# Patient Record
Sex: Male | Born: 1935 | Race: White | Hispanic: No | Marital: Married | Smoking: Former smoker
Health system: Southern US, Community
[De-identification: ages and names within clinical notes are randomized; demographics above are authoritative.]

## PROBLEM LIST (undated history)

## (undated) DIAGNOSIS — J90 Pleural effusion, not elsewhere classified: Secondary | ICD-10-CM

## (undated) DIAGNOSIS — I209 Angina pectoris, unspecified: Secondary | ICD-10-CM

## (undated) DIAGNOSIS — N183 Chronic kidney disease, stage 3 unspecified: Secondary | ICD-10-CM

## (undated) DIAGNOSIS — I6529 Occlusion and stenosis of unspecified carotid artery: Secondary | ICD-10-CM

## (undated) DIAGNOSIS — I5030 Unspecified diastolic (congestive) heart failure: Secondary | ICD-10-CM

## (undated) DIAGNOSIS — R42 Dizziness and giddiness: Secondary | ICD-10-CM

## (undated) DIAGNOSIS — I2510 Atherosclerotic heart disease of native coronary artery without angina pectoris: Secondary | ICD-10-CM

## (undated) DIAGNOSIS — K219 Gastro-esophageal reflux disease without esophagitis: Secondary | ICD-10-CM

## (undated) DIAGNOSIS — C8350 Lymphoblastic (diffuse) lymphoma, unspecified site: Secondary | ICD-10-CM

## (undated) DIAGNOSIS — N400 Enlarged prostate without lower urinary tract symptoms: Secondary | ICD-10-CM

## (undated) DIAGNOSIS — E785 Hyperlipidemia, unspecified: Secondary | ICD-10-CM

## (undated) DIAGNOSIS — L570 Actinic keratosis: Secondary | ICD-10-CM

## (undated) DIAGNOSIS — Z8669 Personal history of other diseases of the nervous system and sense organs: Secondary | ICD-10-CM

## (undated) DIAGNOSIS — F419 Anxiety disorder, unspecified: Secondary | ICD-10-CM

## (undated) DIAGNOSIS — I10 Essential (primary) hypertension: Secondary | ICD-10-CM

## (undated) DIAGNOSIS — F329 Major depressive disorder, single episode, unspecified: Secondary | ICD-10-CM

## (undated) DIAGNOSIS — G8929 Other chronic pain: Secondary | ICD-10-CM

## (undated) DIAGNOSIS — K4090 Unilateral inguinal hernia, without obstruction or gangrene, not specified as recurrent: Secondary | ICD-10-CM

## (undated) DIAGNOSIS — G4730 Sleep apnea, unspecified: Secondary | ICD-10-CM

## (undated) DIAGNOSIS — M549 Dorsalgia, unspecified: Secondary | ICD-10-CM

## (undated) HISTORY — PX: COLONOSCOPY: SHX174

## (undated) HISTORY — PX: FEMUR FRACTURE SURGERY: SHX633

## (undated) HISTORY — DX: Personal history of other diseases of the nervous system and sense organs: Z86.69

## (undated) HISTORY — DX: Major depressive disorder, single episode, unspecified: F32.9

## (undated) HISTORY — DX: Dizziness and giddiness: R42

## (undated) HISTORY — PX: HIP FRACTURE SURGERY: SHX118

## (undated) HISTORY — DX: Benign prostatic hyperplasia without lower urinary tract symptoms: N40.0

## (undated) HISTORY — DX: Unspecified diastolic (congestive) heart failure: I50.30

## (undated) HISTORY — DX: Essential (primary) hypertension: I10

## (undated) HISTORY — DX: Unilateral inguinal hernia, without obstruction or gangrene, not specified as recurrent: K40.90

## (undated) HISTORY — DX: Dorsalgia, unspecified: M54.9

## (undated) HISTORY — DX: Atherosclerotic heart disease of native coronary artery without angina pectoris: I25.10

## (undated) HISTORY — DX: Other chronic pain: G89.29

## (undated) HISTORY — DX: Gastro-esophageal reflux disease without esophagitis: K21.9

## (undated) HISTORY — DX: Sleep apnea, unspecified: G47.30

## (undated) HISTORY — DX: Hyperlipidemia, unspecified: E78.5

## (undated) HISTORY — PX: APPENDECTOMY: SHX54

## (undated) HISTORY — DX: Actinic keratosis: L57.0

## (undated) HISTORY — DX: Pleural effusion, not elsewhere classified: J90

## (undated) HISTORY — DX: Occlusion and stenosis of unspecified carotid artery: I65.29

## (undated) HISTORY — DX: Anxiety disorder, unspecified: F41.9

## (undated) HISTORY — DX: Lymphoblastic (diffuse) lymphoma, unspecified site: C83.50

## (undated) HISTORY — PX: HEMORROIDECTOMY: SUR656

---

## 1996-02-17 DIAGNOSIS — C8350 Lymphoblastic (diffuse) lymphoma, unspecified site: Secondary | ICD-10-CM

## 1996-02-17 HISTORY — DX: Lymphoblastic (diffuse) lymphoma, unspecified site: C83.50

## 2000-02-17 HISTORY — PX: CARDIAC CATHETERIZATION: SHX172

## 2006-02-16 HISTORY — PX: OTHER SURGICAL HISTORY: SHX169

## 2006-02-16 HISTORY — PX: EYE SURGERY: SHX253

## 2011-12-20 DIAGNOSIS — Z8669 Personal history of other diseases of the nervous system and sense organs: Secondary | ICD-10-CM | POA: Insufficient documentation

## 2013-06-03 ENCOUNTER — Emergency Department: Payer: Self-pay | Admitting: Emergency Medicine

## 2013-06-03 LAB — COMPREHENSIVE METABOLIC PANEL
Albumin: 3.6 g/dL (ref 3.4–5.0)
Alkaline Phosphatase: 94 U/L
Anion Gap: 5 — ABNORMAL LOW (ref 7–16)
BUN: 15 mg/dL (ref 7–18)
Bilirubin,Total: 0.5 mg/dL (ref 0.2–1.0)
Calcium, Total: 9.1 mg/dL (ref 8.5–10.1)
Chloride: 99 mmol/L (ref 98–107)
Co2: 26 mmol/L (ref 21–32)
Creatinine: 1.49 mg/dL — ABNORMAL HIGH (ref 0.60–1.30)
EGFR (African American): 48 — ABNORMAL LOW
EGFR (Non-African Amer.): 42 — ABNORMAL LOW
Glucose: 131 mg/dL — ABNORMAL HIGH (ref 65–99)
Osmolality: 263 (ref 275–301)
Potassium: 4.5 mmol/L (ref 3.5–5.1)
SGOT(AST): 28 U/L (ref 15–37)
SGPT (ALT): 21 U/L (ref 12–78)
Sodium: 130 mmol/L — ABNORMAL LOW (ref 136–145)
Total Protein: 9.3 g/dL — ABNORMAL HIGH (ref 6.4–8.2)

## 2013-06-03 LAB — CBC
HCT: 40.2 % (ref 40.0–52.0)
HGB: 13.1 g/dL (ref 13.0–18.0)
MCH: 28.6 pg (ref 26.0–34.0)
MCHC: 32.5 g/dL (ref 32.0–36.0)
MCV: 88 fL (ref 80–100)
Platelet: 242 10*3/uL (ref 150–440)
RBC: 4.57 10*6/uL (ref 4.40–5.90)
RDW: 14.2 % (ref 11.5–14.5)
WBC: 13.5 10*3/uL — ABNORMAL HIGH (ref 3.8–10.6)

## 2013-06-03 LAB — URINALYSIS, COMPLETE
Bacteria: NONE SEEN
Bilirubin,UR: NEGATIVE
Glucose,UR: NEGATIVE mg/dL (ref 0–75)
Ketone: NEGATIVE
Leukocyte Esterase: NEGATIVE
Nitrite: NEGATIVE
Ph: 6 (ref 4.5–8.0)
Protein: NEGATIVE
RBC,UR: 20 /HPF (ref 0–5)
Specific Gravity: 1.014 (ref 1.003–1.030)
Squamous Epithelial: NONE SEEN
WBC UR: <1 /HPF (ref 0–5)

## 2013-06-03 LAB — TROPONIN I: Troponin-I: <0.02 ng/mL

## 2013-06-16 HISTORY — PX: CARDIAC CATHETERIZATION: SHX172

## 2013-08-02 ENCOUNTER — Inpatient Hospital Stay: Payer: Self-pay | Admitting: Specialist

## 2013-08-02 LAB — PROTIME-INR
INR: 1
Prothrombin Time: 13.4 secs (ref 11.5–14.7)

## 2013-08-02 LAB — CBC WITH DIFFERENTIAL/PLATELET
Basophil #: 0.1 10*3/uL (ref 0.0–0.1)
Basophil %: 0.5 %
Eosinophil #: 0.1 10*3/uL (ref 0.0–0.7)
Eosinophil %: 1 %
HCT: 35.4 % — ABNORMAL LOW (ref 40.0–52.0)
HGB: 11.8 g/dL — ABNORMAL LOW (ref 13.0–18.0)
Lymphocyte #: 1.4 10*3/uL (ref 1.0–3.6)
Lymphocyte %: 12.3 %
MCH: 29.4 pg (ref 26.0–34.0)
MCHC: 33.3 g/dL (ref 32.0–36.0)
MCV: 88 fL (ref 80–100)
Monocyte #: 0.6 x10 3/mm (ref 0.2–1.0)
Monocyte %: 5 %
Neutrophil #: 9.1 10*3/uL — ABNORMAL HIGH (ref 1.4–6.5)
Neutrophil %: 81.2 %
Platelet: 201 10*3/uL (ref 150–440)
RBC: 4.01 10*6/uL — ABNORMAL LOW (ref 4.40–5.90)
RDW: 13.5 % (ref 11.5–14.5)
WBC: 11.2 10*3/uL — ABNORMAL HIGH (ref 3.8–10.6)

## 2013-08-02 LAB — URINALYSIS, COMPLETE
Bilirubin,UR: NEGATIVE
Blood: NEGATIVE
Glucose,UR: NEGATIVE mg/dL (ref 0–75)
Ketone: NEGATIVE
Leukocyte Esterase: NEGATIVE
Nitrite: NEGATIVE
Ph: 6 (ref 4.5–8.0)
Protein: NEGATIVE
RBC,UR: NONE SEEN /HPF (ref 0–5)
Specific Gravity: 1.012 (ref 1.003–1.030)
Squamous Epithelial: NONE SEEN
WBC UR: <1 /HPF (ref 0–5)

## 2013-08-02 LAB — MAGNESIUM: Magnesium: 2 mg/dL

## 2013-08-02 LAB — CK-MB: CK-MB: 1.6 ng/mL (ref 0.5–3.6)

## 2013-08-02 LAB — TROPONIN I
Troponin-I: <0.02 ng/mL
Troponin-I: <0.02 ng/mL
Troponin-I: <0.02 ng/mL

## 2013-08-02 LAB — CK TOTAL AND CKMB (NOT AT ARMC)
CK, Total: 89 U/L
CK, Total: 78 U/L
CK-MB: 2.2 ng/mL (ref 0.5–3.6)
CK-MB: 1.9 ng/mL (ref 0.5–3.6)

## 2013-08-02 LAB — BASIC METABOLIC PANEL
Anion Gap: 11 (ref 7–16)
BUN: 17 mg/dL (ref 7–18)
Calcium, Total: 8.6 mg/dL (ref 8.5–10.1)
Chloride: 101 mmol/L (ref 98–107)
Co2: 25 mmol/L (ref 21–32)
Creatinine: 1.46 mg/dL — ABNORMAL HIGH (ref 0.60–1.30)
EGFR (African American): 49 — ABNORMAL LOW
EGFR (Non-African Amer.): 43 — ABNORMAL LOW
Glucose: 154 mg/dL — ABNORMAL HIGH (ref 65–99)
Osmolality: 278 (ref 275–301)
Potassium: 3.9 mmol/L (ref 3.5–5.1)
Sodium: 137 mmol/L (ref 136–145)

## 2013-08-02 LAB — APTT: Activated PTT: 33.3 secs (ref 23.6–35.9)

## 2013-08-03 LAB — CBC WITH DIFFERENTIAL/PLATELET
Basophil #: 0 10*3/uL (ref 0.0–0.1)
Basophil %: 0.2 %
Eosinophil #: 0.3 10*3/uL (ref 0.0–0.7)
Eosinophil %: 2 %
HCT: 33.4 % — ABNORMAL LOW (ref 40.0–52.0)
HGB: 11 g/dL — ABNORMAL LOW (ref 13.0–18.0)
Lymphocyte #: 1.2 10*3/uL (ref 1.0–3.6)
Lymphocyte %: 8.7 %
MCH: 29 pg (ref 26.0–34.0)
MCHC: 32.8 g/dL (ref 32.0–36.0)
MCV: 88 fL (ref 80–100)
Monocyte #: 0.9 x10 3/mm (ref 0.2–1.0)
Monocyte %: 6.8 %
Neutrophil #: 11 10*3/uL — ABNORMAL HIGH (ref 1.4–6.5)
Neutrophil %: 82.3 %
Platelet: 188 10*3/uL (ref 150–440)
RBC: 3.78 10*6/uL — ABNORMAL LOW (ref 4.40–5.90)
RDW: 12.9 % (ref 11.5–14.5)
WBC: 13.4 10*3/uL — ABNORMAL HIGH (ref 3.8–10.6)

## 2013-08-03 LAB — BASIC METABOLIC PANEL
Anion Gap: 6 — ABNORMAL LOW (ref 7–16)
BUN: 13 mg/dL (ref 7–18)
Calcium, Total: 8.6 mg/dL (ref 8.5–10.1)
Chloride: 102 mmol/L (ref 98–107)
Co2: 26 mmol/L (ref 21–32)
Creatinine: 1.24 mg/dL (ref 0.60–1.30)
EGFR (African American): >60
EGFR (Non-African Amer.): 52 — ABNORMAL LOW
Glucose: 121 mg/dL — ABNORMAL HIGH (ref 65–99)
Osmolality: 270 (ref 275–301)
Potassium: 4.5 mmol/L (ref 3.5–5.1)
Sodium: 134 mmol/L — ABNORMAL LOW (ref 136–145)

## 2013-08-03 LAB — MAGNESIUM: Magnesium: 2 mg/dL

## 2013-08-04 LAB — BASIC METABOLIC PANEL
Anion Gap: 5 — ABNORMAL LOW (ref 7–16)
BUN: 15 mg/dL (ref 7–18)
Calcium, Total: 8.8 mg/dL (ref 8.5–10.1)
Chloride: 102 mmol/L (ref 98–107)
Co2: 27 mmol/L (ref 21–32)
Creatinine: 1.3 mg/dL (ref 0.60–1.30)
EGFR (African American): 57 — ABNORMAL LOW
EGFR (Non-African Amer.): 49 — ABNORMAL LOW
Glucose: 121 mg/dL — ABNORMAL HIGH (ref 65–99)
Osmolality: 270 (ref 275–301)
Potassium: 4.5 mmol/L (ref 3.5–5.1)
Sodium: 134 mmol/L — ABNORMAL LOW (ref 136–145)

## 2013-08-04 LAB — CBC WITH DIFFERENTIAL/PLATELET
Basophil #: 0 10*3/uL (ref 0.0–0.1)
Basophil %: 0.1 %
Eosinophil #: 0 10*3/uL (ref 0.0–0.7)
Eosinophil %: 0 %
HCT: 31.9 % — ABNORMAL LOW (ref 40.0–52.0)
HGB: 10.5 g/dL — ABNORMAL LOW (ref 13.0–18.0)
Lymphocyte #: 1 10*3/uL (ref 1.0–3.6)
Lymphocyte %: 4.7 %
MCH: 28.9 pg (ref 26.0–34.0)
MCHC: 32.9 g/dL (ref 32.0–36.0)
MCV: 88 fL (ref 80–100)
Monocyte #: 1 x10 3/mm (ref 0.2–1.0)
Monocyte %: 5 %
Neutrophil #: 18.5 10*3/uL — ABNORMAL HIGH (ref 1.4–6.5)
Neutrophil %: 90.2 %
Platelet: 181 10*3/uL (ref 150–440)
RBC: 3.63 10*6/uL — ABNORMAL LOW (ref 4.40–5.90)
RDW: 12.9 % (ref 11.5–14.5)
WBC: 20.6 10*3/uL — ABNORMAL HIGH (ref 3.8–10.6)

## 2013-08-05 LAB — WBC: WBC: 11.4 10*3/uL — ABNORMAL HIGH (ref 3.8–10.6)

## 2013-08-05 LAB — HEMOGLOBIN: HGB: 9.8 g/dL — ABNORMAL LOW (ref 13.0–18.0)

## 2013-12-13 ENCOUNTER — Inpatient Hospital Stay: Payer: Self-pay | Admitting: Internal Medicine

## 2013-12-13 LAB — BASIC METABOLIC PANEL
Anion Gap: 7 (ref 7–16)
BUN: 18 mg/dL (ref 7–18)
Calcium, Total: 8.7 mg/dL (ref 8.5–10.1)
Chloride: 102 mmol/L (ref 98–107)
Co2: 27 mmol/L (ref 21–32)
Creatinine: 1.36 mg/dL — ABNORMAL HIGH (ref 0.60–1.30)
EGFR (African American): >60
EGFR (Non-African Amer.): 53 — ABNORMAL LOW
Glucose: 123 mg/dL — ABNORMAL HIGH (ref 65–99)
Osmolality: 275 (ref 275–301)
Potassium: 3.9 mmol/L (ref 3.5–5.1)
Sodium: 136 mmol/L (ref 136–145)

## 2013-12-13 LAB — CBC
HCT: 31.7 % — ABNORMAL LOW (ref 40.0–52.0)
HGB: 10.1 g/dL — ABNORMAL LOW (ref 13.0–18.0)
MCH: 28.2 pg (ref 26.0–34.0)
MCHC: 31.9 g/dL — ABNORMAL LOW (ref 32.0–36.0)
MCV: 88 fL (ref 80–100)
Platelet: 212 10*3/uL (ref 150–440)
RBC: 3.59 10*6/uL — ABNORMAL LOW (ref 4.40–5.90)
RDW: 13.7 % (ref 11.5–14.5)
WBC: 8.5 10*3/uL (ref 3.8–10.6)

## 2013-12-13 LAB — TROPONIN I: Troponin-I: <0.02 ng/mL

## 2013-12-14 LAB — BASIC METABOLIC PANEL
Anion Gap: 6 — ABNORMAL LOW (ref 7–16)
BUN: 16 mg/dL (ref 7–18)
Calcium, Total: 8.7 mg/dL (ref 8.5–10.1)
Chloride: 103 mmol/L (ref 98–107)
Co2: 28 mmol/L (ref 21–32)
Creatinine: 1.24 mg/dL (ref 0.60–1.30)
EGFR (African American): >60
EGFR (Non-African Amer.): 58 — ABNORMAL LOW
Glucose: 137 mg/dL — ABNORMAL HIGH (ref 65–99)
Osmolality: 277 (ref 275–301)
Potassium: 4.3 mmol/L (ref 3.5–5.1)
Sodium: 137 mmol/L (ref 136–145)

## 2013-12-14 LAB — CBC WITH DIFFERENTIAL/PLATELET
Basophil #: 0 10*3/uL (ref 0.0–0.1)
Basophil %: 0.2 %
Eosinophil #: 0.1 10*3/uL (ref 0.0–0.7)
Eosinophil %: 0.7 %
HCT: 28.1 % — ABNORMAL LOW (ref 40.0–52.0)
HGB: 9.2 g/dL — ABNORMAL LOW (ref 13.0–18.0)
Lymphocyte #: 1.3 10*3/uL (ref 1.0–3.6)
Lymphocyte %: 7.8 %
MCH: 28.7 pg (ref 26.0–34.0)
MCHC: 32.8 g/dL (ref 32.0–36.0)
MCV: 87 fL (ref 80–100)
Monocyte #: 1.5 x10 3/mm — ABNORMAL HIGH (ref 0.2–1.0)
Monocyte %: 9.1 %
Neutrophil #: 13.3 10*3/uL — ABNORMAL HIGH (ref 1.4–6.5)
Neutrophil %: 82.2 %
Platelet: 215 10*3/uL (ref 150–440)
RBC: 3.21 10*6/uL — ABNORMAL LOW (ref 4.40–5.90)
RDW: 13.3 % (ref 11.5–14.5)
WBC: 16.3 10*3/uL — ABNORMAL HIGH (ref 3.8–10.6)

## 2013-12-14 LAB — HEMOGLOBIN: HGB: 9.6 g/dL — ABNORMAL LOW (ref 13.0–18.0)

## 2013-12-14 LAB — TSH: Thyroid Stimulating Horm: 0.267 u[IU]/mL — ABNORMAL LOW

## 2013-12-14 LAB — LIPID PANEL
Cholesterol: 60 mg/dL (ref 0–200)
HDL Cholesterol: 24 mg/dL — ABNORMAL LOW (ref 40–60)
Ldl Cholesterol, Calc: 20 mg/dL (ref 0–100)
Triglycerides: 80 mg/dL (ref 0–200)
VLDL Cholesterol, Calc: 16 mg/dL (ref 5–40)

## 2013-12-14 LAB — HEMOGLOBIN A1C: Hemoglobin A1C: 5.7 % (ref 4.2–6.3)

## 2013-12-15 LAB — CBC WITH DIFFERENTIAL/PLATELET
Basophil #: 0 10*3/uL (ref 0.0–0.1)
Basophil #: 0 10*3/uL (ref 0.0–0.1)
Basophil #: 0 10*3/uL (ref 0.0–0.1)
Basophil %: 0.1 %
Basophil %: 0 %
Basophil %: 0.1 %
Eosinophil #: 0 10*3/uL (ref 0.0–0.7)
Eosinophil #: 0 10*3/uL (ref 0.0–0.7)
Eosinophil #: 0 10*3/uL (ref 0.0–0.7)
Eosinophil %: 0 %
Eosinophil %: 0 %
Eosinophil %: 0 %
HCT: 28.4 % — ABNORMAL LOW (ref 40.0–52.0)
HCT: 24.5 % — ABNORMAL LOW (ref 40.0–52.0)
HCT: 25.2 % — ABNORMAL LOW (ref 40.0–52.0)
HGB: 9 g/dL — ABNORMAL LOW (ref 13.0–18.0)
HGB: 7.7 g/dL — ABNORMAL LOW (ref 13.0–18.0)
HGB: 8 g/dL — ABNORMAL LOW (ref 13.0–18.0)
Lymphocyte #: 1.4 10*3/uL (ref 1.0–3.6)
Lymphocyte #: 1.3 10*3/uL (ref 1.0–3.6)
Lymphocyte #: 1.6 10*3/uL (ref 1.0–3.6)
Lymphocyte %: 3.1 %
Lymphocyte %: 3.7 %
Lymphocyte %: 5.2 %
MCH: 28.2 pg (ref 26.0–34.0)
MCH: 28.4 pg (ref 26.0–34.0)
MCH: 28.3 pg (ref 26.0–34.0)
MCHC: 31.8 g/dL — ABNORMAL LOW (ref 32.0–36.0)
MCHC: 31.7 g/dL — ABNORMAL LOW (ref 32.0–36.0)
MCHC: 31.9 g/dL — ABNORMAL LOW (ref 32.0–36.0)
MCV: 89 fL (ref 80–100)
MCV: 90 fL (ref 80–100)
MCV: 89 fL (ref 80–100)
Monocyte #: 2.5 x10 3/mm — ABNORMAL HIGH (ref 0.2–1.0)
Monocyte #: 1.3 x10 3/mm — ABNORMAL HIGH (ref 0.2–1.0)
Monocyte #: 2.1 x10 3/mm — ABNORMAL HIGH (ref 0.2–1.0)
Monocyte %: 5.6 %
Monocyte %: 3.8 %
Monocyte %: 6.6 %
Neutrophil #: 41 10*3/uL — ABNORMAL HIGH (ref 1.4–6.5)
Neutrophil #: 32.4 10*3/uL — ABNORMAL HIGH (ref 1.4–6.5)
Neutrophil #: 27.5 10*3/uL — ABNORMAL HIGH (ref 1.4–6.5)
Neutrophil %: 91.2 %
Neutrophil %: 92.5 %
Neutrophil %: 88.1 %
Platelet: 173 10*3/uL (ref 150–440)
Platelet: 154 10*3/uL (ref 150–440)
Platelet: 145 10*3/uL — ABNORMAL LOW (ref 150–440)
RBC: 3.19 10*6/uL — ABNORMAL LOW (ref 4.40–5.90)
RBC: 2.73 10*6/uL — ABNORMAL LOW (ref 4.40–5.90)
RBC: 2.84 10*6/uL — ABNORMAL LOW (ref 4.40–5.90)
RDW: 13.3 % (ref 11.5–14.5)
RDW: 13.4 % (ref 11.5–14.5)
RDW: 13.5 % (ref 11.5–14.5)
WBC: 45 10*3/uL — ABNORMAL HIGH (ref 3.8–10.6)
WBC: 35 10*3/uL — ABNORMAL HIGH (ref 3.8–10.6)
WBC: 31.2 10*3/uL — ABNORMAL HIGH (ref 3.8–10.6)

## 2013-12-15 LAB — URINALYSIS, COMPLETE
Bilirubin,UR: NEGATIVE
Glucose,UR: NEGATIVE mg/dL (ref 0–75)
Granular Cast: 3
Ketone: NEGATIVE
Leukocyte Esterase: NEGATIVE
Nitrite: NEGATIVE
Ph: 5 (ref 4.5–8.0)
Protein: 30
RBC,UR: 148 /HPF (ref 0–5)
Specific Gravity: 1.028 (ref 1.003–1.030)
Squamous Epithelial: 1
WBC UR: 10 /HPF (ref 0–5)

## 2013-12-15 LAB — BASIC METABOLIC PANEL
Anion Gap: 14 (ref 7–16)
Anion Gap: 9 (ref 7–16)
BUN: 25 mg/dL — ABNORMAL HIGH (ref 7–18)
BUN: 33 mg/dL — ABNORMAL HIGH (ref 7–18)
Calcium, Total: 7.8 mg/dL — ABNORMAL LOW (ref 8.5–10.1)
Calcium, Total: 7.6 mg/dL — ABNORMAL LOW (ref 8.5–10.1)
Chloride: 107 mmol/L (ref 98–107)
Chloride: 106 mmol/L (ref 98–107)
Co2: 17 mmol/L — ABNORMAL LOW (ref 21–32)
Co2: 24 mmol/L (ref 21–32)
Creatinine: 2.3 mg/dL — ABNORMAL HIGH (ref 0.60–1.30)
Creatinine: 2.58 mg/dL — ABNORMAL HIGH (ref 0.60–1.30)
EGFR (African American): 35 — ABNORMAL LOW
EGFR (African American): 30 — ABNORMAL LOW
EGFR (Non-African Amer.): 29 — ABNORMAL LOW
EGFR (Non-African Amer.): 25 — ABNORMAL LOW
Glucose: 161 mg/dL — ABNORMAL HIGH (ref 65–99)
Glucose: 148 mg/dL — ABNORMAL HIGH (ref 65–99)
Osmolality: 284 (ref 275–301)
Osmolality: 288 (ref 275–301)
Potassium: 5.5 mmol/L — ABNORMAL HIGH (ref 3.5–5.1)
Potassium: 5 mmol/L (ref 3.5–5.1)
Sodium: 138 mmol/L (ref 136–145)
Sodium: 139 mmol/L (ref 136–145)

## 2013-12-15 LAB — T4, FREE: Free Thyroxine: 0.88 ng/dL (ref 0.76–1.46)

## 2013-12-16 LAB — BASIC METABOLIC PANEL
Anion Gap: 8 (ref 7–16)
Anion Gap: 6 — ABNORMAL LOW (ref 7–16)
BUN: 31 mg/dL — ABNORMAL HIGH (ref 7–18)
BUN: 25 mg/dL — ABNORMAL HIGH (ref 7–18)
Calcium, Total: 7.5 mg/dL — ABNORMAL LOW (ref 8.5–10.1)
Calcium, Total: 7.2 mg/dL — ABNORMAL LOW (ref 8.5–10.1)
Chloride: 102 mmol/L (ref 98–107)
Chloride: 104 mmol/L (ref 98–107)
Co2: 27 mmol/L (ref 21–32)
Co2: 28 mmol/L (ref 21–32)
Creatinine: 1.83 mg/dL — ABNORMAL HIGH (ref 0.60–1.30)
Creatinine: 1.41 mg/dL — ABNORMAL HIGH (ref 0.60–1.30)
EGFR (African American): 45 — ABNORMAL LOW
EGFR (African American): >60
EGFR (Non-African Amer.): 37 — ABNORMAL LOW
EGFR (Non-African Amer.): 50 — ABNORMAL LOW
Glucose: 131 mg/dL — ABNORMAL HIGH (ref 65–99)
Glucose: 114 mg/dL — ABNORMAL HIGH (ref 65–99)
Osmolality: 282 (ref 275–301)
Osmolality: 281 (ref 275–301)
Potassium: 4 mmol/L (ref 3.5–5.1)
Potassium: 4.2 mmol/L (ref 3.5–5.1)
Sodium: 137 mmol/L (ref 136–145)
Sodium: 138 mmol/L (ref 136–145)

## 2013-12-16 LAB — CBC WITH DIFFERENTIAL/PLATELET
Basophil #: 0 10*3/uL (ref 0.0–0.1)
Basophil %: 0 %
Eosinophil #: 0 10*3/uL (ref 0.0–0.7)
Eosinophil %: 0.2 %
HCT: 25.2 % — ABNORMAL LOW (ref 40.0–52.0)
HGB: 8.4 g/dL — ABNORMAL LOW (ref 13.0–18.0)
Lymphocyte #: 0.9 10*3/uL — ABNORMAL LOW (ref 1.0–3.6)
Lymphocyte %: 3.7 %
MCH: 28.6 pg (ref 26.0–34.0)
MCHC: 33.1 g/dL (ref 32.0–36.0)
MCV: 87 fL (ref 80–100)
Monocyte #: 1.6 x10 3/mm — ABNORMAL HIGH (ref 0.2–1.0)
Monocyte %: 6.5 %
Neutrophil #: 22.4 10*3/uL — ABNORMAL HIGH (ref 1.4–6.5)
Neutrophil %: 89.6 %
Platelet: 143 10*3/uL — ABNORMAL LOW (ref 150–440)
RBC: 2.92 10*6/uL — ABNORMAL LOW (ref 4.40–5.90)
RDW: 13.5 % (ref 11.5–14.5)
WBC: 25 10*3/uL — ABNORMAL HIGH (ref 3.8–10.6)

## 2013-12-16 LAB — HEMOGLOBIN: HGB: 7.7 g/dL — ABNORMAL LOW (ref 13.0–18.0)

## 2013-12-17 LAB — BASIC METABOLIC PANEL
Anion Gap: 7 (ref 7–16)
BUN: 23 mg/dL — ABNORMAL HIGH (ref 7–18)
Calcium, Total: 7.6 mg/dL — ABNORMAL LOW (ref 8.5–10.1)
Chloride: 103 mmol/L (ref 98–107)
Co2: 27 mmol/L (ref 21–32)
Creatinine: 1.2 mg/dL (ref 0.60–1.30)
EGFR (African American): >60
EGFR (Non-African Amer.): >60
Glucose: 104 mg/dL — ABNORMAL HIGH (ref 65–99)
Osmolality: 278 (ref 275–301)
Potassium: 4.2 mmol/L (ref 3.5–5.1)
Sodium: 137 mmol/L (ref 136–145)

## 2013-12-17 LAB — CBC WITH DIFFERENTIAL/PLATELET
Basophil #: 0 10*3/uL (ref 0.0–0.1)
Basophil %: 0.1 %
Eosinophil #: 0.1 10*3/uL (ref 0.0–0.7)
Eosinophil %: 0.7 %
HCT: 25.6 % — ABNORMAL LOW (ref 40.0–52.0)
HGB: 8.5 g/dL — ABNORMAL LOW (ref 13.0–18.0)
Lymphocyte #: 0.8 10*3/uL — ABNORMAL LOW (ref 1.0–3.6)
Lymphocyte %: 4 %
MCH: 28.7 pg (ref 26.0–34.0)
MCHC: 33.1 g/dL (ref 32.0–36.0)
MCV: 87 fL (ref 80–100)
Monocyte #: 1.7 x10 3/mm — ABNORMAL HIGH (ref 0.2–1.0)
Monocyte %: 8.6 %
Neutrophil #: 16.7 10*3/uL — ABNORMAL HIGH (ref 1.4–6.5)
Neutrophil %: 86.6 %
Platelet: 156 10*3/uL (ref 150–440)
RBC: 2.95 10*6/uL — ABNORMAL LOW (ref 4.40–5.90)
RDW: 14.8 % — ABNORMAL HIGH (ref 11.5–14.5)
WBC: 19.3 10*3/uL — ABNORMAL HIGH (ref 3.8–10.6)

## 2013-12-18 LAB — BASIC METABOLIC PANEL
Anion Gap: 7 (ref 7–16)
BUN: 14 mg/dL (ref 7–18)
Calcium, Total: 8.6 mg/dL (ref 8.5–10.1)
Chloride: 103 mmol/L (ref 98–107)
Co2: 26 mmol/L (ref 21–32)
Creatinine: 1.19 mg/dL (ref 0.60–1.30)
EGFR (African American): >60
EGFR (Non-African Amer.): >60
Glucose: 95 mg/dL (ref 65–99)
Osmolality: 272 (ref 275–301)
Potassium: 4.5 mmol/L (ref 3.5–5.1)
Sodium: 136 mmol/L (ref 136–145)

## 2013-12-18 LAB — HEMOGLOBIN: HGB: 8.7 g/dL — ABNORMAL LOW (ref 13.0–18.0)

## 2013-12-19 ENCOUNTER — Encounter: Payer: Self-pay | Admitting: Internal Medicine

## 2013-12-20 LAB — CULTURE, BLOOD (SINGLE)

## 2013-12-24 LAB — BETA STREP CULTURE(ARMC)

## 2014-01-16 ENCOUNTER — Encounter: Payer: Self-pay | Admitting: Internal Medicine

## 2014-02-16 ENCOUNTER — Encounter: Payer: Self-pay | Admitting: Internal Medicine
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
## 2014-06-09 NOTE — Discharge Summary (Signed)
PATIENT NAME:  [name], [name] MR#:  [id number] DATE OF BIRTH:  06/19/1923  DATE OF ADMISSION:  12/13/2013 DATE OF DISCHARGE:  12/18/2013  DISCHARGE DIAGNOSES: 1.  Anemia, acute post hemorrhagic, status post 3 units of packed red blood cell transfusion. No obvious bleeding.  2.  Acute renal failure due to acute tubular necrosis.  3.  Left femur fracture, status post open reduction internal fixation. 4.  Hypotension, resolved.  5.  Chronic pleural effusion.  6.  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   SECONDARY DIAGNOSES: 1.  Hypertension.  2.  Coronary artery disease.  3.  Osteoporosis.  4.  Benign prostatic hypertrophy.  5.  Bell palsy.   CONSULTATIONS:  1.  Orthopedics, Dr. Hessie Knows.  2.  Nephrology, Dr. Holley Raring.  3.  Physical and occupational therapy.  PROCEDURES: Left ORIF with hemiarthroplasty by Dr. Hessie Knows on the 29th of October.   DIAGNOSTIC DATA: Radiology: Left femur x-ray on 28th of October showed proximal left femoral shaft fracture.   Chest x-ray on 28th of October showed left pleural effusion and associated airspace consolidation.  Left hip x-ray on the 28th of October showed proximal left femoral shaft fracture. Prior screw fixation of left femoral neck fracture.   Repeat left femur x-ray on 28th of October showed proximal left femoral shaft fracture, unchanged.   Left hip x-ray on 29th of October showed left total hip arthroplasty in satisfactory position.   Left hip x-ray on 29th of October, repeated, showed left hip arthroplasty with femoral diaphysis fracture fixation.   Chest x-ray on 30th of October showed increasing left basilar changes with associated effusion.   Bilateral kidney ultrasound on 30th of October was normal.   Major laboratory panel: UA on admission showed trace bacteria, 10 WBCs, otherwise negative.   Blood cultures x2 were negative.   HISTORY AND SHORT HOSPITAL COURSE: The  patient is an 81 year old male with above-mentioned medical problems who was admitted with left femur fracture status post fall. Please see Dr. Legrand Como Diamond's dictated history and physical for further details. Orthopedic consultation was obtained with Dr. Hessie Knows who did surgery on 29th of October with ORIF and hemiarthroplasty. Subsequently, the patient had anemia requiring 3 units of packed red blood cells, associated with hypotension, which has been improving with some IV hydration. The patient also had acute renal failure thought to be due to ATN and that has also resolved with IV hydration. The patient is feeling much better. He does have some swelling, mainly in the dependent area around his penis and scrotal area, likely from the fluid volume that he has received for which he has been placed on as needed Lasix. He is feeling much better. He is being discharged to rehab facility in stable condition.   PERTINENT DISCHARGE PHYSICAL EXAMINATION: VITAL SIGNS: On the date of discharge, Temperature is 98.3, heart rate 96 per minute, respirations 20 per minute, blood pressure 124/61 and he is saturating 93% on room air.  HEART: S1, S2 normal. No murmurs, rubs or gallops.  LUNGS: Clear to auscultation bilaterally. No wheezing, rales, rhonchi, or crepitation.  ABDOMEN: Soft, benign.  NEUROLOGIC: Nonfocal examination.  HIPS: His left hip area has minimal swelling. He was nontender. GENITOURINARY: He had a Foley catheter in place which is being removed at this time.   All other physical examination remained at baseline.   DISCHARGE MEDICATIONS: 1.  Aspirin 81 mg p.o. daily.  2.  Plavix 75  mg p.o. daily.  3.  Nitroglycerin 0.4 mg sublingual every 5 minutes as needed.  4.  Protonix 40 mg p.o. daily.  5.  Cardura 1 mg p.o. at bedtime.  6.  Hydroxyzine 25 mg p.o. b.i.d.  7.  Imdur 30 mg p.o. daily.  8.  Metoprolol 25 mg p.o. b.i.d.  9.  Pravastatin 40 mg p.o. at bedtime.  10.  Doxepin 75 mg  p.o. at bedtime.  11.  Tramadol 50 mg p.o. every 6 hours as needed.  12.  PreserVision 1 tablet p.o. b.i.d.  13.  Vitamin D3 5000 international units once daily.  14.  Tylenol 500 mg p.o. b.i.d.  15.  MiraLax once daily.  16.  Probiotic once daily.  17.  Ferrous sulfate 325 mg p.o. b.i.d.  18.  Oxycodone 5 mg p.o. every 6 hours x7 days as needed.  19.  Lasix 20 mg p.o. daily as needed.   DISCHARGE DIET: Regular.   DISCHARGE ACTIVITY: As tolerated.   DISCHARGE INSTRUCTIONS AND FOLLOW-UP: The patient was instructed to follow up with his primary care physician at [facility] facility who will be seeing him in the next 1 to 2 days. He will need follow-up with [facility] orthopedics in 1 to 2 weeks. He will get physical therapy evaluation and management while at the facility. He was instructed to hold lisinopril at this time.   The patient remains at very high risk for readmission.  TOTAL TIME DISCHARGING THIS PATIENT: 55 minutes.  ____________________________ Lucina Mellow. Manuella Ghazi, MD vss:sb D: 12/18/2013 11:57:44 ET T: 12/18/2013 12:24:54 ET JOB#: [id number]  cc: Laural Eiland S. Manuella Ghazi, MD, <Dictator> [facility] - [facility] [place] Lilian Kapur, MD Laurene Footman, MD Lucina Mellow [facility] MD ELECTRONICALLY SIGNED 12/22/2013 10:58

## 2014-06-09 NOTE — Op Note (Signed)
PATIENT NAME:  [name], [name] MR#:  [id number] DATE OF BIRTH:  1923-08-28  DATE OF PROCEDURE:  08/03/2013  PREOPERATIVE DIAGNOSIS: Minimally displaced left femoral neck fracture.   POSTOPERATIVE DIAGNOSIS: Minimally displaced left femoral neck fracture.   PROCEDURE PERFORMED: Closed reduction and percutaneous pinning, left femoral neck fracture.   SURGEON: Park Breed, M.D.   ANESTHESIA: General LMA.   COMPLICATIONS: None.   DRAINS: None.   ESTIMATED BLOOD LOSS: Minimal.  REPLACEMENTS: None.   DESCRIPTION OF PROCEDURE: The patient was brought to the operating room where he underwent satisfactory general LMA anesthesia in the supine position on the fracture table. The right leg was flexed and abducted. The left leg was placed in internal rotation in minimal traction. Fluoroscopy showed good position of the fracture in AP and lateral views. The left hip was prepped and draped in sterile fashion, and 4 stab wounds made laterally. Four guide pins were inserted under fluoroscopic control and then four 7.3 Synthes cannulated screws were inserted over the pins. Final fluoroscopy showed good position of the fracture and the hardware. The stab wounds were closed with 3-0 nylon suture and a dry sterile dressing applied. The patient was transferred to her hospital bed and taken to recovery in good condition. He had good motion of the hip.   ____________________________ Park Breed, MD hem:aw D: 08/03/2013 10:13:04 ET T: 08/03/2013 10:28:55 ET JOB#: [id number]  cc: Park Breed, MD, <Dictator> Park Breed MD ELECTRONICALLY SIGNED 08/03/2013 12:13

## 2014-06-09 NOTE — Consult Note (Signed)
PATIENT NAME:  [name], [name] MR#:  [id number] DATE OF BIRTH:  [birth year]  DATE OF CONSULTATION:  12/14/2013  REFERRING PHYSICIAN:   CONSULTING PHYSICIAN:  Dionisio David, MD   HISTORY OF PRESENT ILLNESS: This is an 81 year old white male with a past medical history of [name], hypertension, coronary artery disease, osteoporosis, benign prosthetic hypertrophy, who presented to the Emergency Room after falling down and a fracture of the left hip. I was asked to evaluate the patient because the patient needs clearing for surgery. The patient denies any chest pain or shortness of breath. He is just having pain at the hip area where the fracture is.    PAST MEDICAL HISTORY: As mentioned, has a history of coronary artery disease, [name], and prostate enlargement. He also had surgery of the right knee and before also had left hip repair and fixation. He had stenting in 2015 at [facility].   SOCIAL HISTORY: Denies EtOH abuse or smoking.   FAMILY HISTORY: Positive for CVA.    CURRENT MEDICATIONS: Aspirin, metoprolol, lisinopril, isosorbide, Plavix, pravastatin, Protonix.   ALLERGIES: CELEBREX.   PHYSICAL EXAMINATION: GENERAL: He is alert, oriented x 3, in no acute distress.  VITAL SIGNS: Blood pressure is 118/55, respirations 18, pulse is 99, temperature 98.7, saturation 95.  NECK: No JVD.  LUNGS: Clear.  HEART: Regular rate and rhythm. Normal S1, S2. No audible murmur.  ABDOMEN: Soft, nontender, positive bowel sounds.  EXTREMITIES: No pedal edema.  NEUROLOGIC: The patient appears to be intact.   LABORATORY AND DIAGNOSTICS: His EKG shows normal sinus rhythm, 70 beats per minute with old inferior wall MI. His x-ray showed left hip proximal left femoral shaft fracture. His BUN is 18, creatinine 1.36. White count is 8.5, hemoglobin 10.1. Chest x-ray shows left pleural effusion.   ASSESSMENT AND PLAN: The patient has a left hip fracture requiring further surgery. He had surgery on the left hip  before also with internal fixation, apparently developed another fracture on the same side. He denies any chest pain or shortness of breath. His labs and EKG look unremarkable and unchanged. Advise stopping the Plavix, continue aspirin, and proceed with surgery. As per how long Plavix should have been stopped prior to surgery, I think if it has been stopped since yesterday, it might be reasonable to operate in the next day or 2. Would continue aspirin because he had PCI and stenting recently at [facility].    ____________________________ Dionisio David, MD sak:at D: 12/14/2013 08:58:51 ET T: 12/14/2013 09:07:30 ET JOB#: [id number]  cc: Dionisio David, MD, <Dictator> Dionisio David MD ELECTRONICALLY SIGNED 01/18/2014 15:35

## 2014-06-09 NOTE — Consult Note (Signed)
Brief Consult Note: Diagnosis: Hip fracure with PCI/Stenting may 28th.   Patient was seen by consultant.   Consult note dictated.   Recommend to proceed with surgery or procedure.  Electronic Signatures: Angelica Ran (MD)  (Signed 17-Jun-15 21:43)  Authored: Brief Consult Note   Last Updated: 17-Jun-15 21:43 by Angelica Ran (MD)

## 2014-06-09 NOTE — Op Note (Signed)
PATIENT NAME:  [name], [name] MR#:  [id number] DATE OF BIRTH:  1924/01/01  DATE OF PROCEDURE:  12/14/2013  PREOPERATIVE DIAGNOSES:  1.  Nonunion, left femoral neck fracture.  2.  Left femoral shaft fracture.  POSTOPERATIVE DIAGNOSES: 1.  Nonunion, left femoral neck fracture.  2.  Left femoral shaft fracture.    PROCEDURE:  1.  Removal of deep hardware, multiple screws, left hip.  2.  Open reduction and internal fixation, left femur.  3.  Left hip hemiarthroplasty.   ANESTHESIA: General.   SURGEON: Laurene Footman, MD   DESCRIPTION OF PROCEDURE: The patient was brought to the Operating Room and, after general anesthesia was obtained, the patient was transferred to the operative table with the right leg on a well-padded table and the left leg in the Medacta foot attachment. C-arm was brought in. With traction, good reduction of the femur was obtained. After prepping and draping of the hip, appropriate patient identification and timeout procedure were completed. Cell Saver was used during the procedure.   After appropriate patient identification and timeout procedure, we proceeded with opening the lateral femur with a direct lateral approach. Incision was made down through the skin and subcutaneous tissue, elevating the iliotibial band, and the vastus lateralis was quite swollen secondary to bleeding from the fracture with patient being on Plavix. The vastus lateralis was elevated off the intermuscular septum and the lateral femur with a large hematoma present. Hemostasis was achieved with electrocautery during the procedure. With some difficulty, the proximal screws were identified under the vastus lateralis in the most proximal extent of the incision. Screws were then removed sequentially. After removal of the screws, fluoroscopic view showed displacement of the femoral neck fracture, consistent with nonunion. Next a small single knotless stainless steel wire was placed around the fracture with  rotation of the femur to get anatomic reduction. This wire was then tightened to get the spiral fracture reduced initially.   Next, a 12-hole periarticular fracture plate from [employer] was chosen and applied to the lateral femur. The whirlybird fixation device was used proximally and distally to get appropriate position for the 4.5 mm curved 12-hole plate per the periarticular system. Three locking pins were placed for the cerclage wires proximally. The 4 distal screws cortical screws were placed, followed by the 3 wires proximally, nonlocking screws distally. The 3 wires were passed and then placed through the knot and tightened, but not crimped. Additionally, a single unicortical screw was placed through the plate to try to help with rotational stability for the next part of the procedure. After this was completed, the leg was brought into external rotation with an anterior approach. The direct anterior approach, centered over the tensor fascia lata muscle, was carried out with the muscle retracted laterally. Deep retractor placed after ligating the lateral circumflex vessels. The anterior capsule was opened, and the femoral neck cut made. The femoral head was removed with minimal arthritis present, but really no healing. Next the pubofemoral and ischiofemoral releases were carried out. The hip was quite stiff secondary to the shortening of the nonunion. This made the rotation difficult. The leg was dropped into extension and sequential broaching was carried out. Eventually, a #9 AMAS stem appeared to fit well. This was chosen as it gave very good proximal fill with an M 28 mm head and a 58 mm cup, as that was the size of the removed ball, and a trial fit well. These components were placed and the hip reduced.  At this point, the cerclage wires were tightened proximally and an additional cerclage wire was placed at the level of the middle of the plate where the initial small wire had been placed to try to give  it additional rigidity at the level of the distal portion of the stem. The leg length appeared to be appropriate, based on x-ray.   The wounds were thoroughly irrigated. A Cell Saver was used during the procedure. Additionally, and an Avitene-type spray was applied to the wounds to minimize bleeding at the close of the case. The wounds were closed with a #1 Vicryl for the deep fascia and the anterior incision, heavy quill for the IT band, 2-0 Quill subcutaneously and skin staples. Xeroform, 4x4's, ABD and tape were applied. The patient was sent to the recovery room in stable condition.   ESTIMATED BLOOD LOSS: 2000 mL. A large amount of it being initial hematoma. 1 units of packed red blood cells and 650 from the Cell Saver were reinfused.   IMPLANTS: The 12-hole Synthes periprosthetic plate with 4 wires and multiple screws. There was Medacta size 9 standard AMAS stem with an M 28 mm head and a 55 mm bipolar head.   SPECIMEN REMOVED: Femoral head.   COMPLICATIONS: There were no apparent complications.   CONDITION: To recovery room, stable.    ____________________________ Laurene Footman, MD mjm:MT D: 12/14/2013 23:00:45 ET T: 12/15/2013 08:19:54 ET JOB#: [id number]  cc: Laurene Footman, MD, <Dictator> Laurene Footman MD ELECTRONICALLY SIGNED 12/15/2013 12:43

## 2014-06-09 NOTE — Consult Note (Signed)
Brief Consult Note: Diagnosis: Left femoral neck fracture.   Patient was seen by consultant.   Recommend to proceed with surgery or procedure.   Recommend further assessment or treatment.   Orders entered.   Discussed with Attending MD.   Comments: 81 year old male fell going to the bathroom last night injuring the left hip.  Brought to Emergency Room where exam and X-rays show minimally displaced left femoral neck fracture.  He had a cardiac stent inserted at [facility] 3 weeks ago and is on Plavix.  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  Discussed treatment options with patient, wife, and daughter at length.  Risks and benefits of surgery were discussed at length including but not limited to infection, non union, nerve or blood vessed damage, non union, need for repeat surgery, blood clots and lung emboli, and death.. He is considered high risk and I feel that percutaneous pinning of the fracture is the safest treatment for him at this time rather than a major hemiarthroplasty.  Will need medical and cardiac clearance.  Plan surgery tomorrow AM if cleared.  Dr Benjie Karvonen recommended subqutaneous heparin today.    Exam:  Alert and oriented.  Pain with range of motion of left hip.  circulation/sensation/motor function good.  Length normal.  No bruising.    X-rays: minimally displaced left femoral neck fracture.  Rx:  Percutaneous pinning in AM.  The risks of non union and avascular necrosis were discussed with the family. I feel that this is the safest procedure for him at this time given his cardiac history.  Electronic Signatures: Park Breed (MD)  (Signed 17-Jun-15 17:09)  Authored: Brief Consult Note   Last Updated: 17-Jun-15 17:09 by Park Breed (MD)

## 2014-06-09 NOTE — Discharge Summary (Signed)
PATIENT NAME:  [name], [name] MR#:  [id number] DATE OF BIRTH:  12/15/1923  DATE OF ADMISSION:  08/02/2013 DATE OF DISCHARGE:  08/05/2013   For a detailed note, please take a look at the history and physical done on admission by Dr. Waldron Labs.   DIAGNOSES AT DISCHARGE: As follows: 1.  Status post fall and left hip fracture.  2.  History of coronary artery disease.  3.  Hypertension.  4.  History of lymphoma.  5.  Pleural effusion.   DIET: The patient is being discharged home on a low-sodium, low-fat diet.   ACTIVITY: As tolerated.   FOLLOWUP: With Dr. Earnestine Leys in the next 1 to 2 weeks.   DISCHARGE MEDICATIONS: Are as follows: Zofran 4 mg t.i.d. as needed, aspirin 81 mg daily, Plavix 75 mg daily, sublingual nitroglycerin as needed, Protonix 40 mg daily, Cardura 1 mg daily, doxepin 150 mg mg at bedtime, hydroxyzine 25 mg b.i.d. as needed, Imdur 30 mg daily, lisinopril 2.5 mg daily, metoprolol tartrate 25 mg b.i.d., Pravachol 40 mg daily, Tylenol with hydrocodone 1 tab q.4 to 6 hours as needed, Fosamax 70 mg weekly, Senokot b.i.d. as needed, iron sulfate 325 mg b.i.d., Dulcolax suppository as needed, Colace 240 mg at bedtime, calcium with vitamin D 1 tab b.i.d.   CONSULTANTS DURING HOSPITAL COURSE: Dr. Earnestine Leys from orthopedics.   PERTINENT STUDIES DONE DURING THE HOSPITAL COURSE: Are as follows: An x-ray of the left hip done showing mild displaced oblique transcervical fracture through the left femoral neck.   HOSPITAL COURSE: This is an 81 year old male with medical problems as mentioned above, presented to the hospital on August 02, 2013, due to a fall and noted to have a left hip fracture.  1.  Status post fall and left hip fracture. This was likely secondary to a mechanical fall. The patient was seen by orthopedics and underwent nailing and pinning of the left hip and the patient is postop day #2. Clinically doing well from the pain standpoint and also working well with physical  therapy. The patient therefore is being discharged to short-term rehab for ongoing physical therapy presently. The patient is also currently being maintained on aspirin and Plavix for DVT prophylaxis.  2.  History of coronary artery disease, status post recent stent placement. The patient was seen by cardiology in the hospital, Dr. Neoma Laming, who recommended continuing with surgery. Post surgery, the patient has had no chest pain. He is currently being maintained on his aspirin, Plavix, beta blocker, and statin.  3.  Hypertension. The patient remained hemodynamically stable. He will continue his Imdur, metoprolol, doxazosin, and lisinopril as stated. 4.  BPH. The patient was maintained on his Cardura. He will resume that.  5.  GERD. The patient was maintained on his Protonix. He will resume that.  6.  Hyperlipidemia. The patient was maintained on his Pravachol, and he will resume that upon discharge.  7.  Osteoporosis. The patient will continue his Fosamax as stated, along with calcium and vitamin D supplements.   The patient is a full code. He is being discharged to a skilled nursing facility for ongoing rehab.   TIME SPENT: 40 minutes.   ____________________________ Belia Heman. Verdell Carmine, MD vjs:jcm D: 08/05/2013 14:13:39 ET T: 08/05/2013 14:37:16 ET JOB#: [id number]  cc: Belia Heman. Verdell Carmine, MD, <Dictator> Park Breed, MD Henreitta Leber MD ELECTRONICALLY SIGNED 08/15/2013 20:51
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
## 2014-06-09 NOTE — Consult Note (Signed)
PATIENT NAME:  [name], DAPOLITO MR#:  [id number] DATE OF BIRTH:  [birth year]  DATE OF CONSULTATION:  08/02/2013  CONSULTING PHYSICIAN:  Dionisio David, MD  INDICATION FOR CONSULTATION:  Preoperative clearance for fall with hip fracture.   This is an 81 year old white male with a past medical history of having coronary artery disease status post PCI and stenting on May 28th at [facility]. Apparently, he is on aspirin and Plavix, which has been on hold since he has a hip fracture. He is apparently scheduled for surgery tomorrow at 7:30 in the morning and I was asked to evaluate the patient. The patient denies any chest pain, shortness of breath, PND, orthopnea or leg swelling. He says he just had stent put in on May 28th. From what it looks like, it may be a drug-eluting stent, because the patient was told to take aspirin along with Plavix for 1 year, but it may have been bare metal stent also. We are not sure. We will try to get history from [facility].   PAST MEDICAL HISTORY: As mentioned, PCI and stenting; history of hypertension,  History of [name], history of appendectomy, hemorrhoidectomy, cataract surgery.   ALLERGIES: None.   HOME MEDICATIONS:  Aspirin and Plavix, simvastatin.   SOCIAL HISTORY:  Denies EtOH abuse or smoking.   FAMILY HISTORY:  No history of coronary artery disease.   PHYSICAL EXAMINATION: GENERAL:  He is alert, oriented, in no acute distress, except for the hip pain.  VITAL SIGNS:  Blood pressure is 136/70, respirations 20, pulse 89, temperature 99.3. Saturation is 91%.  HEENT:  Revealed no JVD.  LUNGS:  Clear.  HEART:  Regular rate and rhythm. Normal S1, S2. No audible murmur.  ABDOMEN:  Soft, nontender, positive bowel sounds.  EXTREMITIES:  No pedal edema.   EKG shows normal sinus rhythm, 67 beats per minute, old inferior wall MI.  No acute changes. Troponins x 3 are negative. Rest of the labs also look unremarkable, except for creatinine is 1.47 and   glucose 154. His hemoglobin is 11.8. White count is 11.2, platelet count 201.   ASSESSMENT AND PLAN: The patient has a recent percutaneous coronary intervention and stenting; unclear which vessel, at [facility] on May 28. He does need urgent surgery. He denies any chest pain, shortness of breath. There are no acute EKG changes. Troponins are negative. He may proceed with surgery and may need platelet transfusion if there is excessive bleeding, and blood transfusion, or may wait a few days for Plavix effect to wear off. There is a risk of having in-stent restenosis because of recent stenting, but urgency of the surgery is also important, and advise anticoagulation with heparin in perioperative period and we will start Plavix as soon as possible, along with aspirin after surgery. Advised proceeding with surgery tomorrow morning, if there is no objection to having had Plavix within 48 hours.    ____________________________ Dionisio David, MD sak:dmm D: 08/02/2013 21:43:03 ET T: 08/02/2013 22:10:55 ET JOB#: [id number]  cc: Dionisio David, MD, <Dictator> Dionisio David MD ELECTRONICALLY SIGNED 08/30/2013 9:21

## 2014-06-09 NOTE — Consult Note (Signed)
PATIENT NAME:  [name], DUPLANTIS MR#:  [id number] DATE OF BIRTH:  October 19, 1923  DATE OF CONSULTATION:  12/13/2013  REFERRING PHYSICIAN:   CONSULTING PHYSICIAN:  Laurene Footman, MD  REASON FOR CONSULTATION: Left femur and femoral neck fractures.   HISTORY OF PRESENT ILLNESS: The patient is an 81 year old who suffered a fracture in June of his left femoral neck. Treated by Dr. Earnestine Leys with left hip pinning.  That appears to have failed on x-rays taken today that Dr. Marry Guan had ordered. He also has a proximal femoral shaft fracture and Dr. Marry Guan asked me for consultation. The patient has a large amount of swelling. Is on Plavix and needs to be on this because of recent stent placement. 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 Now he has been using a walker with some pain in the groin and had been much more limited.   PHYSICAL EXAMINATION:  The left thigh is very swollen but he is neurovascularly intact distally. His x-rays show a somewhat displaced femoral neck fracture since his initial fixation. There has been loss of fixation with the hardware backing out and superiorly at the neck there appears to be a gap that appears to be going into varus and to nonunion. Regarding this shaft fracture there was a spiral proximal fracture with shortening that is a few centimeters on x-ray with Buck traction in place.  My recommendation is to address both problems with a single procedure with removal of the prior hardware, going ahead and fixing the femur and then essentially converting this into a periprosthetic fracture with the prosthesis to be placed through an anterior approach after fixing the femur. This should allow for at least early partial weight bearing, possibly full weight bearing, depending on the bone quality and fixation. Will use Cell Saver during surgery since he is likely to have significant bleeding.  Risk, benefits, and  possible complications were discussed with the patient and his family. We will plan on proceeding tomorrow afternoon.   ____________________________ Laurene Footman, MD mjm:jw D: 12/13/2013 18:45:37 ET T: 12/13/2013 21:15:00 ET JOB#: [id number]  cc: Laurene Footman, MD, <Dictator> Laurene Footman MD ELECTRONICALLY SIGNED 12/13/2013 22:08

## 2014-06-11 ENCOUNTER — Ambulatory Visit: Payer: Self-pay | Admitting: Cardiovascular Disease

## 2014-06-11 LAB — SURGICAL PATHOLOGY

## 2014-07-05 ENCOUNTER — Encounter: Payer: Self-pay | Admitting: *Deleted

## 2014-07-06 ENCOUNTER — Encounter: Payer: Self-pay | Admitting: Cardiovascular Disease

## 2014-07-06 ENCOUNTER — Ambulatory Visit (INDEPENDENT_AMBULATORY_CARE_PROVIDER_SITE_OTHER): Payer: Medicare Other | Admitting: Cardiovascular Disease

## 2014-07-06 VITALS — BP 106/56 | HR 73 | Ht 70.0 in | Wt 184.0 lb

## 2014-07-06 DIAGNOSIS — I2510 Atherosclerotic heart disease of native coronary artery without angina pectoris: Secondary | ICD-10-CM | POA: Diagnosis not present

## 2014-07-06 DIAGNOSIS — I10 Essential (primary) hypertension: Secondary | ICD-10-CM | POA: Diagnosis not present

## 2014-07-06 DIAGNOSIS — I6529 Occlusion and stenosis of unspecified carotid artery: Secondary | ICD-10-CM | POA: Insufficient documentation

## 2014-07-06 DIAGNOSIS — I25810 Atherosclerosis of coronary artery bypass graft(s) without angina pectoris: Secondary | ICD-10-CM

## 2014-07-06 DIAGNOSIS — R0602 Shortness of breath: Secondary | ICD-10-CM | POA: Diagnosis not present

## 2014-07-06 DIAGNOSIS — E785 Hyperlipidemia, unspecified: Secondary | ICD-10-CM

## 2014-07-06 MED ORDER — ASPIRIN EC 81 MG PO TBEC: 81.0000 mg | DELAYED_RELEASE_TABLET | Freq: Every day | ORAL | Status: DC

## 2014-07-06 NOTE — Assessment & Plan Note (Signed)
He seems to be doing reasonably well. I recommend continuing medical therapy. He can stop Plavix at the end of the month and switched to aspirin 81 mg once daily.

## 2014-07-06 NOTE — Assessment & Plan Note (Signed)
Blood pressure is well controlled on current medications. 

## 2014-07-06 NOTE — Progress Notes (Signed)
Primary care physician: Dr. Keith Rake  HPI  This is a pleasant 81 year old man who is here today to establish cardiovascular care. He has known history of coronary artery disease status post LAD PCI with drug-eluting stent placement in May of last year, hypertension and hyperlipidemia. He had previous cardiac catheterization in early 2000 which showed subtotal occlusion of the right coronary artery with left-to-right collaterals. He was managed medically. He returned last year with progressive anginal symptoms and thus underwent cardiac catheterization which showed a 70% proximal LAD stenosis in addition to the chronic RCA findings. He underwent an angioplasty and drug-eluting stent placement to the proximal LAD. He fell in June and had a fractured left hip which ultimately require surgery. His mobility has been somewhat limited due to that. He has been doing reasonably well and denies any chest pain or shortness of breath. He lives at [facility].  Allergies  Allergen Reactions  . Baclofen   . Sulfamethoxazole-Trimethoprim      Current Outpatient Prescriptions on File Prior to Visit  Medication Sig Dispense Refill  . acetaminophen (TYLENOL) 500 MG tablet Take 500 mg by mouth 2 (two) times daily.    . Cholecalciferol (VITAMIN D3) 5000 UNITS CAPS Take by mouth daily.    . clonazePAM (KLONOPIN) 0.5 MG tablet Take 0.5 mg by mouth as needed for anxiety.    . clopidogrel (PLAVIX) 75 MG tablet Take 75 mg by mouth daily.    Marland Kitchen doxazosin (CARDURA) 1 MG tablet Take 1 mg by mouth daily.    . isosorbide mononitrate (IMDUR) 30 MG 24 hr tablet Take 30 mg by mouth daily.    . metoprolol tartrate (LOPRESSOR) 25 MG tablet Take 25 mg by mouth 2 (two) times daily.    . Multiple Vitamins-Minerals (PRESERVISION/LUTEIN PO) Take by mouth 2 (two) times daily.    . nitroGLYCERIN (NITROSTAT) 0.4 MG SL tablet Place 0.4 mg under the tongue every 5 (five) minutes as needed for chest pain.    . polyethylene glycol  (MIRALAX / GLYCOLAX) packet Take 17 g by mouth daily.    . pravastatin (PRAVACHOL) 40 MG tablet Take 40 mg by mouth daily.    . sertraline (ZOLOFT) 100 MG tablet Take 150 mg by mouth daily.     . traMADol (ULTRAM) 50 MG tablet Take 50 mg by mouth 2 (two) times daily.      No current facility-administered medications on file prior to visit.     Past Medical History  Diagnosis Date  . Carotid stenosis   . Lymphoblastic lymphoma   . Hx of [name]   . Coronary artery disease   . Dyslipidemia   . Hypertension   . Chronic back pain   . Anxiety   . GERD (gastroesophageal reflux disease)   . Multiple myeloma   . BPH (benign prostatic hypertrophy)   . Pleural effusion     left  . Inguinal hernia   . Sleep apnea   . Major depression   . Hyperlipidemia      Past Surgical History  Procedure Laterality Date  . Colonoscopy    . Appendectomy    . Hip fracture surgery    . Cardiac catheterization  2002  . Cardiac catheterization  06/2013     Family History  Problem Relation Age of Onset  . Heart disease Mother   . Stroke Mother   . Diabetes Father   . Dementia Father   . Stroke Sister   . Stroke Brother   .  Stroke Sister   . Stroke Sister   . Stroke Brother   . Stroke Brother      History   Social History  . Marital Status: Married    Spouse Name: N/A  . Number of Children: N/A  . Years of Education: N/A   Occupational History  . Not on file.   Social History Main Topics  . Smoking status: Former Smoker -- 2.00 packs/day for 7 years    Types: Cigarettes  . Smokeless tobacco: Not on file  . Alcohol Use: No  . Drug Use: No  . Sexual Activity: Not on file   Other Topics Concern  . Not on file   Social History Narrative     ROS A 10 point review of system was performed. It is negative other than that mentioned in the history of present illness.   PHYSICAL EXAM   BP 106/56 mmHg  Pulse 73  Ht $R'5\' 10"'Io$  (1.778 m)  Wt 184 lb (83.462 kg)  BMI 26.40  kg/m2 Constitutional: He is oriented to person, place, and time. He appears well-developed and well-nourished. No distress.  HENT: No nasal discharge.  Head: Normocephalic and atraumatic.  Eyes: Pupils are equal and round.  No discharge. Neck: Normal range of motion. Neck supple. No JVD present. No thyromegaly present.  Cardiovascular: Normal rate, regular rhythm, normal heart sounds. Exam reveals no gallop and no friction rub. No murmur heard.  Pulmonary/Chest: Effort normal and breath sounds normal. No stridor. No respiratory distress. He has no wheezes. He has no rales. He exhibits no tenderness.  Abdominal: Soft. Bowel sounds are normal. He exhibits no distension. There is no tenderness. There is no rebound and no guarding.  Musculoskeletal: Normal range of motion. He exhibits no edema and no tenderness.  Neurological: He is alert and oriented to person, place, and time. Coordination normal.  Skin: Skin is warm and dry. No rash noted. He is not diaphoretic. No erythema. No pallor.  Psychiatric: He has a normal mood and affect. His behavior is normal. Judgment and thought content normal.       ZOX:WRUEA  Rhythm  -First degree A-V block  PRi = 222 BORDERLINE RHYTHM    ASSESSMENT AND PLAN

## 2014-07-06 NOTE — Patient Instructions (Addendum)
Medication Instructions:  Your physician has recommended you make the following change in your medication:  1) STOP taking Plavix at the end of May 2) START taking 81mg  Aspirin daily beginning June 1 3) STOP Lasix   Labwork: None  Testing/Procedures: None  Follow-Up: Your physician recommends that you schedule a follow-up appointment in: [place] with Dr. Fletcher Anon   Any Other Special Instructions Will Be Listed Below (If Applicable).

## 2014-07-06 NOTE — Assessment & Plan Note (Signed)
No previous history of stroke. He reports having a carotid Doppler which showed a 60-70% stenosis. Continue aggressive treatment of risk factors.

## 2014-07-06 NOTE — Assessment & Plan Note (Addendum)
Continue treatment with pravastatin with a target LDL of less than 70. If LDL is above 70 , a more potent statin can be considered.

## 2014-07-23 ENCOUNTER — Ambulatory Visit (INDEPENDENT_AMBULATORY_CARE_PROVIDER_SITE_OTHER): Payer: Medicare Other | Admitting: Family Medicine

## 2014-07-23 ENCOUNTER — Encounter: Payer: Self-pay | Admitting: Family Medicine

## 2014-07-23 VITALS — BP 100/60 | HR 72 | Resp 15 | Ht 70.0 in | Wt 188.4 lb

## 2014-07-23 DIAGNOSIS — K219 Gastro-esophageal reflux disease without esophagitis: Secondary | ICD-10-CM

## 2014-07-23 DIAGNOSIS — F419 Anxiety disorder, unspecified: Secondary | ICD-10-CM | POA: Insufficient documentation

## 2014-07-23 DIAGNOSIS — I2510 Atherosclerotic heart disease of native coronary artery without angina pectoris: Secondary | ICD-10-CM

## 2014-07-23 DIAGNOSIS — I6529 Occlusion and stenosis of unspecified carotid artery: Secondary | ICD-10-CM | POA: Diagnosis not present

## 2014-07-23 DIAGNOSIS — G8929 Other chronic pain: Secondary | ICD-10-CM | POA: Insufficient documentation

## 2014-07-23 DIAGNOSIS — N400 Enlarged prostate without lower urinary tract symptoms: Secondary | ICD-10-CM | POA: Insufficient documentation

## 2014-07-23 DIAGNOSIS — M545 Low back pain, unspecified: Secondary | ICD-10-CM | POA: Insufficient documentation

## 2014-07-23 DIAGNOSIS — I10 Essential (primary) hypertension: Secondary | ICD-10-CM | POA: Diagnosis not present

## 2014-07-23 DIAGNOSIS — F339 Major depressive disorder, recurrent, unspecified: Secondary | ICD-10-CM | POA: Insufficient documentation

## 2014-07-23 DIAGNOSIS — E785 Hyperlipidemia, unspecified: Secondary | ICD-10-CM

## 2014-07-23 DIAGNOSIS — E7800 Pure hypercholesterolemia, unspecified: Secondary | ICD-10-CM | POA: Insufficient documentation

## 2014-07-23 MED ORDER — PANTOPRAZOLE SODIUM 40 MG PO TBEC: 40.0000 mg | DELAYED_RELEASE_TABLET | Freq: Every day | ORAL | Status: DC

## 2014-07-23 MED ORDER — METOPROLOL TARTRATE 25 MG PO TABS: 25.0000 mg | ORAL_TABLET | Freq: Two times a day (BID) | ORAL | Status: DC

## 2014-07-23 MED ORDER — ISOSORBIDE MONONITRATE ER 30 MG PO TB24: 30.0000 mg | ORAL_TABLET | Freq: Every day | ORAL | Status: DC

## 2014-07-23 MED ORDER — PRAVASTATIN SODIUM 40 MG PO TABS: 40.0000 mg | ORAL_TABLET | Freq: Every day | ORAL | Status: DC

## 2014-07-23 NOTE — Progress Notes (Signed)
Name: [name]   MRN: [id number]    DOB: 13-Aug-1923   Date:07/23/2014       Progress Note  Subjective  Chief Complaint  Chief Complaint  Patient presents with  . Hyperlipidemia  . Hypertension  . Heartburn    Hyperlipidemia This is a chronic problem. The problem is controlled. Pertinent negatives include no chest pain or shortness of breath. Current antihyperlipidemic treatment includes statins. Risk factors for coronary artery disease include dyslipidemia and hypertension.  Hypertension This is a chronic problem. Associated symptoms include anxiety. Pertinent negatives include no chest pain, orthopnea, palpitations or shortness of breath. Past treatments include beta blockers. The current treatment provides significant improvement. Hypertensive end-organ damage includes [name]/MI.  Heartburn He complains of heartburn (very infrequently since he started taking the medication). He reports no abdominal pain, no chest pain, no coughing, no dysphagia or no nausea. This is a chronic problem. He has tried a PPI for the symptoms. The treatment provided significant relief. Past procedures do not include an EGD, esophageal manometry or a UGI.      Past Medical History  Diagnosis Date  . Carotid stenosis   . Lymphoblastic lymphoma   . Hx of [name]   . Coronary artery disease   . Dyslipidemia   . Hypertension   . Chronic back pain   . Anxiety   . GERD (gastroesophageal reflux disease)   . Multiple myeloma   . BPH (benign prostatic hypertrophy)   . Pleural effusion     left  . Inguinal hernia   . Sleep apnea   . Major depression   . Hyperlipidemia     History  Substance Use Topics  . Smoking status: Former Smoker -- 2.00 packs/day for 7 years    Types: Cigarettes  . Smokeless tobacco: Never Used  . Alcohol Use: No   Past Surgical History  Procedure Laterality Date  . Colonoscopy    . Appendectomy    . Hip fracture surgery    . Cardiac catheterization  2002  . Cardiac  catheterization  06/2013   Family History  Problem Relation Age of Onset  . Heart disease Mother   . Stroke Mother   . Diabetes Father   . Dementia Father   . Stroke Sister   . Stroke Brother   . Stroke Sister   . Stroke Sister   . Stroke Brother   . Stroke Brother       Current outpatient prescriptions:  .  acetaminophen (TYLENOL) 500 MG tablet, Take 500 mg by mouth 2 (two) times daily., Disp: , Rfl:  .  aspirin EC 81 MG tablet, Take 1 tablet (81 mg total) by mouth daily., Disp: 90 tablet, Rfl: 3 .  clonazePAM (KLONOPIN) 0.5 MG tablet, Take 0.5 mg by mouth as needed for anxiety., Disp: , Rfl:  .  doxazosin (CARDURA) 1 MG tablet, Take 1 mg by mouth daily., Disp: , Rfl:  .  isosorbide mononitrate (IMDUR) 30 MG 24 hr tablet, Take 1 tablet (30 mg total) by mouth daily., Disp: 90 tablet, Rfl: 0 .  lisinopril (PRINIVIL,ZESTRIL) 2.5 MG tablet, Take 2.5 mg by mouth daily., Disp: , Rfl:  .  metoprolol tartrate (LOPRESSOR) 25 MG tablet, Take 1 tablet (25 mg total) by mouth 2 (two) times daily., Disp: 180 tablet, Rfl:  .  Multiple Vitamins-Minerals (PRESERVISION/LUTEIN PO), Take by mouth 2 (two) times daily., Disp: , Rfl:  .  nitroGLYCERIN (NITROSTAT) 0.4 MG SL tablet, Place 0.4 mg under the tongue  every 5 (five) minutes as needed for chest pain., Disp: , Rfl:  .  pantoprazole (PROTONIX) 40 MG tablet, Take 1 tablet (40 mg total) by mouth daily., Disp: 90 tablet, Rfl: 0 .  pravastatin (PRAVACHOL) 40 MG tablet, Take 1 tablet (40 mg total) by mouth daily., Disp: 90 tablet, Rfl: 0 .  sertraline (ZOLOFT) 100 MG tablet, Take 150 mg by mouth daily. , Disp: , Rfl:  .  traMADol (ULTRAM) 50 MG tablet, Take 50 mg by mouth 2 (two) times daily. , Disp: , Rfl:  .  Cholecalciferol (VITAMIN D3) 5000 UNITS CAPS, Take by mouth daily., Disp: , Rfl:  .  polyethylene glycol (MIRALAX / GLYCOLAX) packet, Take 17 g by mouth daily., Disp: , Rfl:   Allergies  Allergen Reactions  . Baclofen   .  Sulfamethoxazole-Trimethoprim     Review of Systems  Respiratory: Negative for cough and shortness of breath.   Cardiovascular: Negative for chest pain, palpitations and orthopnea.  Gastrointestinal: Positive for heartburn (very infrequently since he started taking the medication). Negative for dysphagia, nausea and abdominal pain.     Objective  Filed Vitals:   07/23/14 0904  BP: 100/60  Pulse: 72  Resp: 15  Height: $Remove'5\' 10"'FbiAqWf$  (1.778 m)  Weight: 188 lb 6.4 oz (85.458 kg)  SpO2: 94%     Physical Exam  Constitutional: He is well-developed, well-nourished, and in no distress.  HENT:  Head: Normocephalic and atraumatic.  Cardiovascular: Normal rate and regular rhythm.   Pulmonary/Chest: Effort normal and breath sounds normal.  Abdominal: Soft. Bowel sounds are normal.  Skin: Skin is warm.  Psychiatric: Affect normal.  Nursing note and vitals reviewed.      Assessment & Plan  1. Coronary artery disease involving native coronary artery of native heart without angina pectoris  He is on ASA $Remo'81mg'KlDQJ$  daily. Plavix has been stopped by Cardiology. - metoprolol tartrate (LOPRESSOR) 25 MG tablet; Take 1 tablet (25 mg total) by mouth 2 (two) times daily.  Dispense: 180 tablet - isosorbide mononitrate (IMDUR) 30 MG 24 hr tablet; Take 1 tablet (30 mg total) by mouth daily.  Dispense: 90 tablet; Refill: 0  2. Essential hypertension BP is at goal. Continue present management.  3. Hyperlipidemia  - pravastatin (PRAVACHOL) 40 MG tablet; Take 1 tablet (40 mg total) by mouth daily.  Dispense: 90 tablet; Refill: 0 - Lipid Profile - Comp Met (CMET)  4. Gastroesophageal reflux disease without esophagitis  - pantoprazole (PROTONIX) 40 MG tablet; Take 1 tablet (40 mg total) by mouth daily.  Dispense: 90 tablet; Refill: 0  There are no diagnoses linked to this encounter.   Bhargav Barbaro [facility] 07/23/2014 9:50 AM

## 2014-07-24 LAB — LIPID PANEL
Chol/HDL Ratio: 3.2 ratio units (ref 0.0–5.0)
Cholesterol, Total: 76 mg/dL — ABNORMAL LOW (ref 100–199)
HDL: 24 mg/dL — ABNORMAL LOW (ref >39)
LDL Calculated: 36 mg/dL (ref 0–99)
Triglycerides: 79 mg/dL (ref 0–149)
VLDL Cholesterol Cal: 16 mg/dL (ref 5–40)

## 2014-07-31 ENCOUNTER — Other Ambulatory Visit: Payer: Self-pay | Admitting: Family Medicine

## 2014-07-31 DIAGNOSIS — I2510 Atherosclerotic heart disease of native coronary artery without angina pectoris: Secondary | ICD-10-CM

## 2014-07-31 NOTE — Telephone Encounter (Signed)
Requesting refill on Metoprolol. Please send to [facility].

## 2014-08-01 MED ORDER — METOPROLOL TARTRATE 25 MG PO TABS: 25.0000 mg | ORAL_TABLET | Freq: Two times a day (BID) | ORAL | Status: DC

## 2014-08-01 NOTE — Telephone Encounter (Signed)
Medication entered

## 2014-08-13 ENCOUNTER — Ambulatory Visit (INDEPENDENT_AMBULATORY_CARE_PROVIDER_SITE_OTHER): Payer: Medicare Other | Admitting: Family Medicine

## 2014-08-13 ENCOUNTER — Encounter: Payer: Self-pay | Admitting: Family Medicine

## 2014-08-13 VITALS — BP 120/80 | HR 84 | Temp 97.8°F | Ht 70.0 in | Wt 189.4 lb

## 2014-08-13 DIAGNOSIS — L089 Local infection of the skin and subcutaneous tissue, unspecified: Secondary | ICD-10-CM | POA: Diagnosis not present

## 2014-08-13 DIAGNOSIS — S40212D Abrasion of left shoulder, subsequent encounter: Secondary | ICD-10-CM | POA: Diagnosis not present

## 2014-08-13 DIAGNOSIS — K6289 Other specified diseases of anus and rectum: Secondary | ICD-10-CM | POA: Insufficient documentation

## 2014-08-13 DIAGNOSIS — S40219A Abrasion of unspecified shoulder, initial encounter: Secondary | ICD-10-CM

## 2014-08-13 DIAGNOSIS — I6529 Occlusion and stenosis of unspecified carotid artery: Secondary | ICD-10-CM | POA: Diagnosis not present

## 2014-08-13 NOTE — Progress Notes (Signed)
Name: [name]   MRN: [id number]    DOB: Apr 28, 1923   Date:08/13/2014       Progress Note  Subjective  Chief Complaint  Chief Complaint  Patient presents with  . Acute Visit    left shoulder injured in fall, xray was negative, has wound, now on doxycycline from urgent care, this is to f/u on the wound.    Shoulder Injury  The incident occurred at home. The left shoulder is affected. The incident occurred more than 1 week ago. The injury mechanism was a fall.  patient sustained a fall at his home, in his bathroom, which apparently caused an abrasion on his left scapular area. He was seen at [facility] 2 days ago and was diagnosed with an infected abrasion and cellulitis of left shoulder. He is now on day 3/7 of Doxycycline. Wife reports some improvement in the wound yesterday at the time of dressing change. Pt. Denies any fevers, chills, nausea, or malaise. Hemorrhoids  Patient is also requesting evaluation of internal hemorrhoids. He has history of hemorrhoids in the past and had surgery many years ago. Notices lower back pain and buttock pain, especially after using the bathroom. Denies any blood in stool.   Past Medical History  Diagnosis Date  . Carotid stenosis   . Lymphoblastic lymphoma   . Hx of [name]   . Coronary artery disease   . Dyslipidemia   . Hypertension   . Chronic back pain   . Anxiety   . GERD (gastroesophageal reflux disease)   . Multiple myeloma   . BPH (benign prostatic hypertrophy)   . Pleural effusion     left  . Inguinal hernia   . Sleep apnea   . Major depression   . Hyperlipidemia     Past Surgical History  Procedure Laterality Date  . Colonoscopy    . Appendectomy    . Hip fracture surgery    . Cardiac catheterization  2002  . Cardiac catheterization  06/2013    Family History  Problem Relation Age of Onset  . Heart disease Mother   . Stroke Mother   . Diabetes Father   . Dementia Father   . Stroke Sister   .  Stroke Brother   . Stroke Sister   . Stroke Sister   . Stroke Brother   . Stroke Brother     History   Social History  . Marital Status: Married    Spouse Name: N/A  . Number of Children: N/A  . Years of Education: N/A   Occupational History  . Not on file.   Social History Main Topics  . Smoking status: Former Smoker -- 2.00 packs/day for 7 years    Types: Cigarettes  . Smokeless tobacco: Never Used  . Alcohol Use: No  . Drug Use: No  . Sexual Activity: Not Currently   Other Topics Concern  . Not on file   Social History Narrative     Current outpatient prescriptions:  .  acetaminophen (TYLENOL) 500 MG tablet, Take 500 mg by mouth 2 (two) times daily., Disp: , Rfl:  .  aspirin EC 81 MG tablet, Take 1 tablet (81 mg total) by mouth daily., Disp: 90 tablet, Rfl: 3 .  Cholecalciferol (VITAMIN D3) 5000 UNITS CAPS, Take by mouth daily., Disp: , Rfl:  .  clonazePAM (KLONOPIN) 0.5 MG tablet, Take 0.5 mg by mouth as needed for anxiety., Disp: , Rfl:  .  doxazosin (CARDURA) 1 MG  tablet, Take 1 mg by mouth daily., Disp: , Rfl:  .  doxycycline (VIBRAMYCIN) 100 MG capsule, Take 1 capsule by mouth 2 (two) times daily., Disp: , Rfl:  .  isosorbide mononitrate (IMDUR) 30 MG 24 hr tablet, Take 1 tablet (30 mg total) by mouth daily., Disp: 90 tablet, Rfl: 0 .  lisinopril (PRINIVIL,ZESTRIL) 2.5 MG tablet, Take 2.5 mg by mouth daily., Disp: , Rfl:  .  metoprolol tartrate (LOPRESSOR) 25 MG tablet, Take 1 tablet (25 mg total) by mouth 2 (two) times daily., Disp: 180 tablet, Rfl: 1 .  Multiple Vitamins-Minerals (PRESERVISION/LUTEIN PO), Take by mouth 2 (two) times daily., Disp: , Rfl:  .  nitroGLYCERIN (NITROSTAT) 0.4 MG SL tablet, Place 0.4 mg under the tongue every 5 (five) minutes as needed for chest pain., Disp: , Rfl:  .  pantoprazole (PROTONIX) 40 MG tablet, Take 1 tablet (40 mg total) by mouth daily., Disp: 90 tablet, Rfl: 0 .  polyethylene glycol (MIRALAX / GLYCOLAX) packet, Take 17 g  by mouth daily., Disp: , Rfl:  .  pravastatin (PRAVACHOL) 40 MG tablet, Take 1 tablet (40 mg total) by mouth daily., Disp: 90 tablet, Rfl: 0 .  sertraline (ZOLOFT) 100 MG tablet, Take 150 mg by mouth daily. , Disp: , Rfl:  .  traMADol (ULTRAM) 50 MG tablet, Take 50 mg by mouth 2 (two) times daily. , Disp: , Rfl:   Allergies  Allergen Reactions  . Baclofen   . Sulfamethoxazole-Trimethoprim      Review of Systems  Constitutional: Negative for fever, chills and malaise/fatigue.  Gastrointestinal: Negative for abdominal pain, diarrhea, constipation, blood in stool and melena.  Musculoskeletal: Negative for back pain.      Objective  Filed Vitals:   08/13/14 1528  BP: 120/80  Pulse: 84  Temp: 97.8 F (36.6 C)  TempSrc: Oral  Height: $Remove'5\' 10"'rEmIlhk$  (1.778 m)  Weight: 189 lb 6.4 oz (85.911 kg)  SpO2: 93%    Physical Exam  Constitutional: He is well-developed, well-nourished, and in no distress.  Genitourinary: Rectum normal. Rectal exam shows no external hemorrhoid (on rectal exam, a small flap-like structure was appreciated at the anterior rectum.), no internal hemorrhoid, no fissure, no mass and no tenderness. Guaiac negative stool.  Skin: Skin is warm.  Area of infected abrasion/cellulitis improving. SKin is intact, no drainge, mild erythema, no edema. Dressing was changed. Pt. Has surrounding hematoma which should improve and resolve without further intervention.  Nursing note and vitals reviewed.         Assessment & Plan 1. Rectal or anal pain Patient will be referred to gastroenterology for further evaluation of possible rectal/anal pain. - Ambulatory referral to Gastroenterology  2. Infected abrasion of shoulder, left, subsequent encounter Area of infected abrasion/cellulitis improving. SKin is intact, no drainge, mild erythema, no edema. Dressing was changed. Pt. Has surrounding hematoma which should improve and resolve without further intervention.     There are  no diagnoses linked to this encounter.  [name] [facility] 08/13/2014 6:54 PM

## 2014-08-16 ENCOUNTER — Other Ambulatory Visit: Payer: Self-pay | Admitting: Family Medicine

## 2014-08-16 DIAGNOSIS — F331 Major depressive disorder, recurrent, moderate: Secondary | ICD-10-CM

## 2014-08-16 MED ORDER — SERTRALINE HCL 100 MG PO TABS: 150.0000 mg | ORAL_TABLET | Freq: Every day | ORAL | Status: DC

## 2014-08-16 NOTE — Telephone Encounter (Signed)
Please call and have pt request refill from his pharmacy.

## 2014-08-16 NOTE — Telephone Encounter (Signed)
Can you take care of this please. Thank you. 

## 2014-08-16 NOTE — Telephone Encounter (Signed)
Requesting refill on Sertraline please send to [facility]. Please return call once complete. [phone]

## 2014-08-29 ENCOUNTER — Other Ambulatory Visit: Payer: Self-pay | Admitting: Family Medicine

## 2014-08-29 MED ORDER — LISINOPRIL 2.5 MG PO TABS: 2.5000 mg | ORAL_TABLET | Freq: Every day | ORAL | Status: DC

## 2014-08-29 NOTE — Telephone Encounter (Signed)
Medication has been refilled and sent to [facility]

## 2014-08-30 ENCOUNTER — Encounter: Payer: Self-pay | Admitting: Family Medicine

## 2014-08-30 ENCOUNTER — Ambulatory Visit (INDEPENDENT_AMBULATORY_CARE_PROVIDER_SITE_OTHER): Payer: Medicare Other | Admitting: Family Medicine

## 2014-08-30 ENCOUNTER — Telehealth: Payer: Self-pay | Admitting: Family Medicine

## 2014-08-30 VITALS — BP 130/70 | HR 77 | Temp 97.9°F | Resp 18 | Wt 188.0 lb

## 2014-08-30 DIAGNOSIS — M545 Low back pain, unspecified: Secondary | ICD-10-CM

## 2014-08-30 DIAGNOSIS — G8929 Other chronic pain: Secondary | ICD-10-CM

## 2014-08-30 DIAGNOSIS — I6529 Occlusion and stenosis of unspecified carotid artery: Secondary | ICD-10-CM | POA: Diagnosis not present

## 2014-08-30 MED ORDER — TRAMADOL-ACETAMINOPHEN 37.5-325 MG PO TABS: 1.0000 | ORAL_TABLET | Freq: Three times a day (TID) | ORAL | Status: DC | PRN

## 2014-08-30 NOTE — Telephone Encounter (Signed)
Lisinopril has been refilled and sent to [facility]. Left voicemail for patient.  Patient has an appointment on 10/23/2014

## 2014-08-30 NOTE — Progress Notes (Signed)
Name: [name]   MRN: [id number]    DOB: 1923-07-16   Date:08/30/2014       Progress Note  Subjective  Chief Complaint  Chief Complaint  Patient presents with  . Back Pain    Back Pain This is a chronic problem. The pain is present in the lumbar spine and gluteal. The pain does not radiate. The pain is at a severity of 2/10 (last night his back pain was at 8-9/10). The pain is worse during the night. The symptoms are aggravated by bending, lying down, sitting and twisting. Pertinent negatives include no bladder incontinence, bowel incontinence, leg pain or paresis. He has tried analgesics for the symptoms. The treatment provided moderate relief.  Pt. has history of trauma to the lumbar spine. He reports pain in his lower back, which is severe mostly at night. He is taking tramadol, which has not significantly helped with pain.    Past Medical History  Diagnosis Date  . Carotid stenosis   . Lymphoblastic lymphoma   . Hx of [name]   . Coronary artery disease   . Dyslipidemia   . Hypertension   . Chronic back pain   . Anxiety   . GERD (gastroesophageal reflux disease)   . Multiple myeloma   . BPH (benign prostatic hypertrophy)   . Pleural effusion     left  . Inguinal hernia   . Sleep apnea   . Major depression   . Hyperlipidemia     Past Surgical History  Procedure Laterality Date  . Colonoscopy    . Appendectomy    . Hip fracture surgery    . Cardiac catheterization  2002  . Cardiac catheterization  06/2013    Family History  Problem Relation Age of Onset  . Heart disease Mother   . Stroke Mother   . Diabetes Father   . Dementia Father   . Stroke Sister   . Stroke Brother   . Stroke Sister   . Stroke Sister   . Stroke Brother   . Stroke Brother     History   Social History  . Marital Status: Married    Spouse Name: N/A  . Number of Children: N/A  . Years of Education: N/A   Occupational History  . Not on file.   Social History Main Topics    . Smoking status: Former Smoker -- 2.00 packs/day for 7 years    Types: Cigarettes  . Smokeless tobacco: Never Used  . Alcohol Use: No  . Drug Use: No  . Sexual Activity: Not Currently   Other Topics Concern  . Not on file   Social History Narrative     Current outpatient prescriptions:  .  acetaminophen (TYLENOL) 500 MG tablet, Take 500 mg by mouth 2 (two) times daily., Disp: , Rfl:  .  aspirin EC 81 MG tablet, Take 1 tablet (81 mg total) by mouth daily., Disp: 90 tablet, Rfl: 3 .  Cholecalciferol (VITAMIN D3) 5000 UNITS CAPS, Take by mouth daily., Disp: , Rfl:  .  clonazePAM (KLONOPIN) 0.5 MG tablet, Take 0.5 mg by mouth as needed for anxiety., Disp: , Rfl:  .  doxazosin (CARDURA) 1 MG tablet, Take 1 mg by mouth daily., Disp: , Rfl:  .  isosorbide mononitrate (IMDUR) 30 MG 24 hr tablet, Take 1 tablet (30 mg total) by mouth daily., Disp: 90 tablet, Rfl: 0 .  lisinopril (PRINIVIL,ZESTRIL) 2.5 MG tablet, Take 1 tablet (2.5 mg total) by mouth daily., Disp:  30 tablet, Rfl: 0 .  metoprolol tartrate (LOPRESSOR) 25 MG tablet, Take 1 tablet (25 mg total) by mouth 2 (two) times daily., Disp: 180 tablet, Rfl: 1 .  Multiple Vitamins-Minerals (PRESERVISION/LUTEIN PO), Take by mouth 2 (two) times daily., Disp: , Rfl:  .  nitroGLYCERIN (NITROSTAT) 0.4 MG SL tablet, Place 0.4 mg under the tongue every 5 (five) minutes as needed for chest pain., Disp: , Rfl:  .  pantoprazole (PROTONIX) 40 MG tablet, Take 1 tablet (40 mg total) by mouth daily., Disp: 90 tablet, Rfl: 0 .  polyethylene glycol (MIRALAX / GLYCOLAX) packet, Take 17 g by mouth daily., Disp: , Rfl:  .  pravastatin (PRAVACHOL) 40 MG tablet, Take 1 tablet (40 mg total) by mouth daily., Disp: 90 tablet, Rfl: 0 .  sertraline (ZOLOFT) 100 MG tablet, Take 1.5 tablets (150 mg total) by mouth daily., Disp: 45 tablet, Rfl: 2 .  traMADol (ULTRAM) 50 MG tablet, Take 50 mg by mouth 2 (two) times daily. , Disp: , Rfl:   Allergies  Allergen Reactions   . Baclofen   . Sulfamethoxazole-Trimethoprim      Review of Systems  Gastrointestinal: Negative for bowel incontinence.  Genitourinary: Negative for bladder incontinence.  Musculoskeletal: Positive for back pain.      Objective  Filed Vitals:   08/30/14 1233  BP: 130/70  Pulse: 77  Temp: 97.9 F (36.6 C)  TempSrc: Oral  Resp: 18  Weight: 188 lb (85.276 kg)  SpO2: 95%    Physical Exam  Constitutional: He is well-developed, well-nourished, and in no distress.  Musculoskeletal:       Left hip: He exhibits no tenderness.       Back:       Legs: Nursing note and vitals reviewed.    Assessment & Plan 1. Acute exacerbation of chronic low back pain We will obtain radiographs of lumbar spine and left hip for evaluation of left lower back and hip pain. We will change tramadol to Ultracet to be taken for no more than 5 days. Recheck in 1 week. - DG Lumbar Spine Complete; Future - DG HIP UNILAT W OR W/O PELVIS MIN 4 VIEWS LEFT; Future - traMADol-acetaminophen (ULTRACET) 37.5-325 MG per tablet; Take 1 tablet by mouth every 8 (eight) hours as needed for moderate pain or severe pain.  Dispense: 15 tablet; Refill: 0    Chastin Garlitz [facility] 08/30/2014 1:02 PM

## 2014-09-04 ENCOUNTER — Other Ambulatory Visit: Payer: Self-pay | Admitting: Family Medicine

## 2014-09-04 MED ORDER — DOXAZOSIN MESYLATE 1 MG PO TABS
1.0000 mg | ORAL_TABLET | Freq: Every day | ORAL | Status: DC
Start: 2014-09-04 — End: 2014-12-24

## 2014-09-04 NOTE — Telephone Encounter (Signed)
Medication has been refilled and sent to [facility] [address] 

## 2014-09-06 ENCOUNTER — Encounter: Payer: Self-pay | Admitting: Family Medicine

## 2014-09-06 ENCOUNTER — Ambulatory Visit
Admission: RE | Admit: 2014-09-06 | Discharge: 2014-09-06 | Disposition: A | Discharge status: Home / Self Care | Payer: Medicare Other | Source: Ambulatory Visit | Attending: Family Medicine | Admitting: Family Medicine

## 2014-09-06 ENCOUNTER — Ambulatory Visit (INDEPENDENT_AMBULATORY_CARE_PROVIDER_SITE_OTHER): Payer: Medicare Other | Admitting: Family Medicine

## 2014-09-06 ENCOUNTER — Other Ambulatory Visit: Payer: Self-pay | Admitting: Family Medicine

## 2014-09-06 VITALS — BP 132/70 | HR 77 | Temp 97.6°F | Resp 19 | Ht 70.0 in | Wt 193.1 lb

## 2014-09-06 DIAGNOSIS — X58XXXA Exposure to other specified factors, initial encounter: Secondary | ICD-10-CM | POA: Diagnosis not present

## 2014-09-06 DIAGNOSIS — M4856XA Collapsed vertebra, not elsewhere classified, lumbar region, initial encounter for fracture: Secondary | ICD-10-CM | POA: Diagnosis not present

## 2014-09-06 DIAGNOSIS — M545 Low back pain, unspecified: Secondary | ICD-10-CM

## 2014-09-06 DIAGNOSIS — I7090 Unspecified atherosclerosis: Secondary | ICD-10-CM | POA: Diagnosis not present

## 2014-09-06 DIAGNOSIS — I6529 Occlusion and stenosis of unspecified carotid artery: Secondary | ICD-10-CM

## 2014-09-06 DIAGNOSIS — G8929 Other chronic pain: Secondary | ICD-10-CM | POA: Diagnosis present

## 2014-09-06 DIAGNOSIS — M8580 Other specified disorders of bone density and structure, unspecified site: Secondary | ICD-10-CM | POA: Insufficient documentation

## 2014-09-06 MED ORDER — HYDROCODONE-ACETAMINOPHEN 5-325 MG PO TABS: 1.0000 | ORAL_TABLET | Freq: Four times a day (QID) | ORAL | Status: DC | PRN

## 2014-09-06 NOTE — Progress Notes (Signed)
Name: [name]   MRN: [id number]    DOB: 10/19/1923   Date:09/06/2014       Progress Note  Subjective  Chief Complaint  Chief Complaint  Patient presents with  . Follow-up    1 wk/ check medication change from last visit    Back Pain This is a recurrent problem. The current episode started more than 1 year ago (long-hx of low back pain w/ intermittent flare-ups.). The pain is present in the lumbar spine. The quality of the pain is described as aching. The pain radiates to the left thigh. The pain is at a severity of 8/10. The pain is severe. The pain is worse during the night. Associated symptoms include leg pain. Pertinent negatives include no bladder incontinence, bowel incontinence or paresis. Risk factors include recent trauma (Pt. had a fall and reportedly injured his low back in). He has tried analgesics (has been on Oxycodone therapy by previous provider at [facility]. This helped with his low back pain significantly.) for the symptoms.     Past Medical History  Diagnosis Date  . Carotid stenosis   . Lymphoblastic lymphoma   . Hx of [name]   . Coronary artery disease   . Dyslipidemia   . Hypertension   . Chronic back pain   . Anxiety   . GERD (gastroesophageal reflux disease)   . Multiple myeloma   . BPH (benign prostatic hypertrophy)   . Pleural effusion     left  . Inguinal hernia   . Sleep apnea   . Major depression   . Hyperlipidemia     Past Surgical History  Procedure Laterality Date  . Colonoscopy    . Appendectomy    . Hip fracture surgery    . Cardiac catheterization  2002  . Cardiac catheterization  06/2013    Family History  Problem Relation Age of Onset  . Heart disease Mother   . Stroke Mother   . Diabetes Father   . Dementia Father   . Stroke Sister   . Stroke Brother   . Stroke Sister   . Stroke Sister   . Stroke Brother   . Stroke Brother     History   Social History  . Marital Status: Married    Spouse Name: N/A   . Number of Children: N/A  . Years of Education: N/A   Occupational History  . Not on file.   Social History Main Topics  . Smoking status: Former Smoker -- 2.00 packs/day for 7 years    Types: Cigarettes  . Smokeless tobacco: Never Used  . Alcohol Use: No  . Drug Use: No  . Sexual Activity: Not Currently   Other Topics Concern  . Not on file   Social History Narrative     Current outpatient prescriptions:  .  acetaminophen (TYLENOL) 500 MG tablet, Take 500 mg by mouth 2 (two) times daily., Disp: , Rfl:  .  aspirin EC 81 MG tablet, Take 1 tablet (81 mg total) by mouth daily., Disp: 90 tablet, Rfl: 3 .  Cholecalciferol (VITAMIN D3) 5000 UNITS CAPS, Take by mouth daily., Disp: , Rfl:  .  clonazePAM (KLONOPIN) 0.5 MG tablet, Take 0.5 mg by mouth as needed for anxiety., Disp: , Rfl:  .  doxazosin (CARDURA) 1 MG tablet, Take 1 tablet (1 mg total) by mouth daily., Disp: 30 tablet, Rfl: 3 .  isosorbide mononitrate (IMDUR) 30 MG 24 hr tablet, Take 1 tablet (30 mg total) by  mouth daily., Disp: 90 tablet, Rfl: 0 .  lisinopril (PRINIVIL,ZESTRIL) 2.5 MG tablet, Take 1 tablet (2.5 mg total) by mouth daily., Disp: 30 tablet, Rfl: 0 .  metoprolol tartrate (LOPRESSOR) 25 MG tablet, Take 1 tablet (25 mg total) by mouth 2 (two) times daily., Disp: 180 tablet, Rfl: 1 .  Multiple Vitamins-Minerals (PRESERVISION/LUTEIN PO), Take by mouth 2 (two) times daily., Disp: , Rfl:  .  nitroGLYCERIN (NITROSTAT) 0.4 MG SL tablet, Place 0.4 mg under the tongue every 5 (five) minutes as needed for chest pain., Disp: , Rfl:  .  pantoprazole (PROTONIX) 40 MG tablet, Take 1 tablet (40 mg total) by mouth daily., Disp: 90 tablet, Rfl: 0 .  polyethylene glycol (MIRALAX / GLYCOLAX) packet, Take 17 g by mouth daily., Disp: , Rfl:  .  pravastatin (PRAVACHOL) 40 MG tablet, Take 1 tablet (40 mg total) by mouth daily., Disp: 90 tablet, Rfl: 0 .  sertraline (ZOLOFT) 100 MG tablet, Take 1.5 tablets (150 mg total) by mouth  daily., Disp: 45 tablet, Rfl: 2 .  traMADol (ULTRAM) 50 MG tablet, Take 50 mg by mouth 2 (two) times daily. , Disp: , Rfl:  .  traMADol-acetaminophen (ULTRACET) 37.5-325 MG per tablet, Take 1 tablet by mouth every 8 (eight) hours as needed for moderate pain or severe pain., Disp: 15 tablet, Rfl: 0  Allergies  Allergen Reactions  . Baclofen   . Sulfamethoxazole-Trimethoprim      Review of Systems  Gastrointestinal: Negative for bowel incontinence.  Genitourinary: Negative for bladder incontinence.  Musculoskeletal: Positive for back pain and falls.     Objective  Filed Vitals:   09/06/14 1453  BP: 132/70  Pulse: 77  Temp: 97.6 F (36.4 C)  TempSrc: Oral  Resp: 19  Height: $Remove'5\' 10"'YhQYcPW$  (1.778 m)  Weight: 193 lb 1.6 oz (87.59 kg)  SpO2: 93%    Physical Exam  Constitutional: He is well-developed, well-nourished, and in no distress.  Cardiovascular: Normal rate and regular rhythm.   Pulmonary/Chest: Effort normal and breath sounds normal.  Musculoskeletal:       Lumbar back: He exhibits tenderness and pain. He exhibits no spasm.       Back:  Nursing note and vitals reviewed.     Assessment & Plan 1. Acute exacerbation of chronic low back pain Patient has had no relief with Ultracet. He has not been able to obtain x-ray of his lumbar spine and left hip. His x-rays were re-authorized today and he will be started on short-term opioid therapy for relief. He was asked to take medication only as directed and as needed and was educated on the potential adverse effects of opioid therapy. Patient verbalized understanding. Follow-up in 2 weeks  - HYDROcodone-acetaminophen (NORCO/VICODIN) 5-325 MG per tablet; Take 1 tablet by mouth every 6 (six) hours as needed for moderate pain.  Dispense: 60 tablet; Refill: 0 - Comprehensive Metabolic Panel (CMET)    Kristl Morioka [facility] 09/06/2014 3:21 PM

## 2014-09-07 LAB — COMPREHENSIVE METABOLIC PANEL
ALT: 11 IU/L (ref 0–44)
AST: 18 IU/L (ref 0–40)
Albumin/Globulin Ratio: 1 — ABNORMAL LOW (ref 1.1–2.5)
Albumin: 4 g/dL (ref 3.5–4.7)
Alkaline Phosphatase: 158 IU/L — ABNORMAL HIGH (ref 39–117)
BUN/Creatinine Ratio: 20 (ref 10–22)
BUN: 27 mg/dL (ref 8–27)
Bilirubin Total: 0.4 mg/dL (ref 0.0–1.2)
CO2: 22 mmol/L (ref 18–29)
Calcium: 9.6 mg/dL (ref 8.6–10.2)
Chloride: 99 mmol/L (ref 97–108)
Creatinine, Ser: 1.32 mg/dL — ABNORMAL HIGH (ref 0.76–1.27)
GFR calc Af Amer: 55 mL/min/{1.73_m2} — ABNORMAL LOW (ref >59)
GFR calc non Af Amer: 48 mL/min/{1.73_m2} — ABNORMAL LOW (ref >59)
Globulin, Total: 3.9 g/dL (ref 1.5–4.5)
Glucose: 96 mg/dL (ref 65–99)
Potassium: 5.6 mmol/L — ABNORMAL HIGH (ref 3.5–5.2)
Sodium: 139 mmol/L (ref 134–144)
Total Protein: 7.9 g/dL (ref 6.0–8.5)

## 2014-09-18 ENCOUNTER — Encounter: Payer: Self-pay | Admitting: Family Medicine

## 2014-09-18 ENCOUNTER — Ambulatory Visit (INDEPENDENT_AMBULATORY_CARE_PROVIDER_SITE_OTHER): Payer: Medicare Other | Admitting: Family Medicine

## 2014-09-18 VITALS — BP 112/64 | HR 81 | Temp 97.4°F | Resp 18 | Ht 70.0 in | Wt 196.4 lb

## 2014-09-18 DIAGNOSIS — M545 Low back pain, unspecified: Secondary | ICD-10-CM

## 2014-09-18 DIAGNOSIS — I6529 Occlusion and stenosis of unspecified carotid artery: Secondary | ICD-10-CM | POA: Diagnosis not present

## 2014-09-18 DIAGNOSIS — G8929 Other chronic pain: Secondary | ICD-10-CM | POA: Diagnosis not present

## 2014-09-18 MED ORDER — HYDROCODONE-ACETAMINOPHEN 5-325 MG PO TABS: 1.0000 | ORAL_TABLET | Freq: Four times a day (QID) | ORAL | Status: DC | PRN

## 2014-09-18 NOTE — Progress Notes (Signed)
Name: [name]   MRN: [id number]    DOB: Jul 13, 1923   Date:09/18/2014       Progress Note  Subjective  Chief Complaint  Chief Complaint  Patient presents with  . Back Pain    2 week F/U-Medication helps, Lower back pain-onset 6-7 years getting worst but medication helps with relief    Back Pain This is a chronic problem. The pain is present in the lumbar spine. The quality of the pain is described as aching. The pain radiates to the left thigh (radiates at times to his left leg down to his knee.). The pain is at a severity of 4/10. The pain is moderate. Associated symptoms include leg pain (pt. had partial hip replacement.). Pertinent negatives include no numbness or paresis. He has tried analgesics for the symptoms. The treatment provided moderate relief.   patient has been on Vicodin 5-325 mg every 6 hours as needed mouth which has provided moderate relief of his lower back pain.   Past Medical History  Diagnosis Date  . Carotid stenosis   . Lymphoblastic lymphoma   . Hx of [name]   . Coronary artery disease   . Dyslipidemia   . Hypertension   . Chronic back pain   . Anxiety   . GERD (gastroesophageal reflux disease)   . Multiple myeloma   . BPH (benign prostatic hypertrophy)   . Pleural effusion     left  . Inguinal hernia   . Sleep apnea   . Major depression   . Hyperlipidemia     Past Surgical History  Procedure Laterality Date  . Colonoscopy    . Appendectomy    . Hip fracture surgery    . Cardiac catheterization  2002  . Cardiac catheterization  06/2013    Family History  Problem Relation Age of Onset  . Heart disease Mother   . Stroke Mother   . Diabetes Father   . Dementia Father   . Stroke Sister   . Stroke Brother   . Stroke Sister   . Stroke Sister   . Stroke Brother   . Stroke Brother     History   Social History  . Marital Status: Married    Spouse Name: N/A  . Number of Children: N/A  . Years of Education: N/A   Occupational  History  . Not on file.   Social History Main Topics  . Smoking status: Former Smoker -- 2.00 packs/day for 7 years    Types: Cigarettes    Quit date: 09/17/1953  . Smokeless tobacco: Never Used  . Alcohol Use: No  . Drug Use: No  . Sexual Activity: Not Currently   Other Topics Concern  . Not on file   Social History Narrative     Current outpatient prescriptions:  .  acetaminophen (TYLENOL) 500 MG tablet, Take 500 mg by mouth 2 (two) times daily., Disp: , Rfl:  .  aspirin EC 81 MG tablet, Take 1 tablet (81 mg total) by mouth daily., Disp: 90 tablet, Rfl: 3 .  Cholecalciferol (VITAMIN D3) 5000 UNITS CAPS, Take by mouth daily., Disp: , Rfl:  .  clonazePAM (KLONOPIN) 0.5 MG tablet, Take 0.5 mg by mouth as needed for anxiety., Disp: , Rfl:  .  doxazosin (CARDURA) 1 MG tablet, Take 1 tablet (1 mg total) by mouth daily., Disp: 30 tablet, Rfl: 3 .  HYDROcodone-acetaminophen (NORCO/VICODIN) 5-325 MG per tablet, Take 1 tablet by mouth every 6 (six) hours as needed for  moderate pain., Disp: 60 tablet, Rfl: 0 .  isosorbide mononitrate (IMDUR) 30 MG 24 hr tablet, Take 1 tablet (30 mg total) by mouth daily., Disp: 90 tablet, Rfl: 0 .  lisinopril (PRINIVIL,ZESTRIL) 2.5 MG tablet, Take 1 tablet (2.5 mg total) by mouth daily., Disp: 30 tablet, Rfl: 0 .  metoprolol tartrate (LOPRESSOR) 25 MG tablet, Take 1 tablet (25 mg total) by mouth 2 (two) times daily., Disp: 180 tablet, Rfl: 1 .  Multiple Vitamins-Minerals (PRESERVISION/LUTEIN PO), Take by mouth 2 (two) times daily., Disp: , Rfl:  .  nitroGLYCERIN (NITROSTAT) 0.4 MG SL tablet, Place 0.4 mg under the tongue every 5 (five) minutes as needed for chest pain., Disp: , Rfl:  .  pantoprazole (PROTONIX) 40 MG tablet, Take 1 tablet (40 mg total) by mouth daily., Disp: 90 tablet, Rfl: 0 .  polyethylene glycol (MIRALAX / GLYCOLAX) packet, Take 17 g by mouth daily., Disp: , Rfl:  .  pravastatin (PRAVACHOL) 40 MG tablet, Take 1 tablet (40 mg total) by mouth  daily., Disp: 90 tablet, Rfl: 0 .  sertraline (ZOLOFT) 100 MG tablet, Take 1.5 tablets (150 mg total) by mouth daily., Disp: 45 tablet, Rfl: 2  Allergies  Allergen Reactions  . Baclofen   . Sulfamethoxazole-Trimethoprim      Review of Systems  Musculoskeletal: Positive for back pain.  Neurological: Negative for numbness.      Objective  Filed Vitals:   09/18/14 1122  BP: 112/64  Pulse: 81  Temp: 97.4 F (36.3 C)  TempSrc: Oral  Resp: 18  Height: 5' 10" (1.778 m)  Weight: 196 lb 6.4 oz (89.086 kg)  SpO2: 95%    Physical Exam  Constitutional: He is well-developed, well-nourished, and in no distress.  Cardiovascular: Normal rate and regular rhythm.   Pulmonary/Chest: Effort normal and breath sounds normal.  Musculoskeletal:       Lumbar back: He exhibits tenderness and pain.       Back:  Nursing note and vitals reviewed.    Assessment & Plan 1. Acute exacerbation of chronic low back pain X-rays of lumbar spine reviewed with the patient. We will order an MRI of lumbar spine for evaluation of a  compression fracture at L4. Pt. is requesting pain control. We will continue on Vicodin 5-3 25 mg every 6 hours when necessary. Patient and his son has been educated in extensive detail regarding the potential adverse effects of opioids and their interactions with benzodiazepines. A controlled substances agreement was reviewed and signed by patient. Follow-up in one month with review of MRI. We will consider referral to a pain clinic at his follow-up appointment. - HYDROcodone-acetaminophen (NORCO/VICODIN) 5-325 MG per tablet; Take 1 tablet by mouth every 6 (six) hours as needed for moderate pain or severe pain.  Dispense: 120 tablet; Refill: 0 - MR Lumbar Spine Wo Contrast; Future    Keyari Kleeman [facility] 09/18/2014 12:01 PM

## 2014-09-19 ENCOUNTER — Ambulatory Visit: Payer: Medicare Other | Admitting: Family Medicine

## 2014-09-19 ENCOUNTER — Encounter: Payer: Self-pay | Admitting: Family Medicine

## 2014-09-20 ENCOUNTER — Ambulatory Visit: Payer: Medicare Other | Admitting: Family Medicine

## 2014-09-25 ENCOUNTER — Ambulatory Visit: Payer: Medicare Other

## 2014-09-26 ENCOUNTER — Ambulatory Visit
Admission: RE | Admit: 2014-09-26 | Discharge: 2014-09-26 | Disposition: A | Discharge status: Home / Self Care | Payer: Medicare Other | Source: Ambulatory Visit | Attending: Family Medicine | Admitting: Family Medicine

## 2014-09-26 ENCOUNTER — Ambulatory Visit: Payer: Medicare Other

## 2014-09-26 DIAGNOSIS — M4856XA Collapsed vertebra, not elsewhere classified, lumbar region, initial encounter for fracture: Secondary | ICD-10-CM | POA: Insufficient documentation

## 2014-09-26 DIAGNOSIS — M545 Low back pain, unspecified: Secondary | ICD-10-CM

## 2014-09-26 DIAGNOSIS — M47896 Other spondylosis, lumbar region: Secondary | ICD-10-CM | POA: Diagnosis not present

## 2014-09-26 DIAGNOSIS — G544 Lumbosacral root disorders, not elsewhere classified: Secondary | ICD-10-CM | POA: Insufficient documentation

## 2014-09-26 DIAGNOSIS — G8929 Other chronic pain: Secondary | ICD-10-CM

## 2014-09-26 DIAGNOSIS — M4806 Spinal stenosis, lumbar region: Secondary | ICD-10-CM | POA: Diagnosis not present

## 2014-09-26 DIAGNOSIS — M7138 Other bursal cyst, other site: Secondary | ICD-10-CM | POA: Insufficient documentation

## 2014-09-28 ENCOUNTER — Encounter: Payer: Self-pay | Admitting: Family Medicine

## 2014-09-28 ENCOUNTER — Ambulatory Visit (INDEPENDENT_AMBULATORY_CARE_PROVIDER_SITE_OTHER): Payer: Medicare Other | Admitting: Family Medicine

## 2014-09-28 VITALS — BP 114/71 | HR 76 | Temp 98.0°F | Resp 17 | Ht 70.0 in | Wt 196.1 lb

## 2014-09-28 DIAGNOSIS — I6529 Occlusion and stenosis of unspecified carotid artery: Secondary | ICD-10-CM | POA: Diagnosis not present

## 2014-09-28 DIAGNOSIS — S32040A Wedge compression fracture of fourth lumbar vertebra, initial encounter for closed fracture: Secondary | ICD-10-CM | POA: Diagnosis not present

## 2014-09-28 NOTE — Progress Notes (Signed)
Name: [name]   MRN: [id number]    DOB: 04/04/1923   Date:09/28/2014       Progress Note  Subjective  Chief Complaint  Chief Complaint  Patient presents with  . Advice Only    Discuss MRI    HPI  Pt. Is here to discuss the MRI of Lumbar spine. It shows acute to subacute L4 superior endplate compression fracture of L4. In addition, there is multilevel degenerative disc disease and a synovial cyst compressing the descending L3 nerve. Pt. Has lower back pain radiating down his left leg, but does not go below the left knee. No weakness or numbness.   Past Medical History  Diagnosis Date  . Carotid stenosis   . Lymphoblastic lymphoma   . Hx of [name]   . Coronary artery disease   . Dyslipidemia   . Hypertension   . Chronic back pain   . Anxiety   . GERD (gastroesophageal reflux disease)   . Multiple myeloma   . BPH (benign prostatic hypertrophy)   . Pleural effusion     left  . Inguinal hernia   . Sleep apnea   . Major depression   . Hyperlipidemia     Past Surgical History  Procedure Laterality Date  . Colonoscopy    . Appendectomy    . Hip fracture surgery    . Cardiac catheterization  2002  . Cardiac catheterization  06/2013    Family History  Problem Relation Age of Onset  . Heart disease Mother   . Stroke Mother   . Diabetes Father   . Dementia Father   . Stroke Sister   . Stroke Brother   . Stroke Sister   . Stroke Sister   . Stroke Brother   . Stroke Brother     Social History   Social History  . Marital Status: Married    Spouse Name: N/A  . Number of Children: N/A  . Years of Education: N/A   Occupational History  . Not on file.   Social History Main Topics  . Smoking status: Former Smoker -- 2.00 packs/day for 7 years    Types: Cigarettes    Quit date: 09/17/1953  . Smokeless tobacco: Never Used  . Alcohol Use: No  . Drug Use: No  . Sexual Activity: Not Currently   Other Topics Concern  . Not on file   Social History  Narrative     Current outpatient prescriptions:  .  acetaminophen (TYLENOL) 500 MG tablet, Take 500 mg by mouth 2 (two) times daily., Disp: , Rfl:  .  aspirin EC 81 MG tablet, Take 1 tablet (81 mg total) by mouth daily., Disp: 90 tablet, Rfl: 3 .  Cholecalciferol (VITAMIN D3) 5000 UNITS CAPS, Take by mouth daily., Disp: , Rfl:  .  clonazePAM (KLONOPIN) 0.5 MG tablet, Take 0.5 mg by mouth as needed for anxiety., Disp: , Rfl:  .  doxazosin (CARDURA) 1 MG tablet, Take 1 tablet (1 mg total) by mouth daily., Disp: 30 tablet, Rfl: 3 .  HYDROcodone-acetaminophen (NORCO/VICODIN) 5-325 MG per tablet, Take 1 tablet by mouth every 6 (six) hours as needed for moderate pain or severe pain., Disp: 120 tablet, Rfl: 0 .  isosorbide mononitrate (IMDUR) 30 MG 24 hr tablet, Take 1 tablet (30 mg total) by mouth daily., Disp: 90 tablet, Rfl: 0 .  lisinopril (PRINIVIL,ZESTRIL) 2.5 MG tablet, Take 1 tablet (2.5 mg total) by mouth daily., Disp: 30 tablet, Rfl: 0 .  metoprolol   tartrate (LOPRESSOR) 25 MG tablet, Take 1 tablet (25 mg total) by mouth 2 (two) times daily., Disp: 180 tablet, Rfl: 1 .  Multiple Vitamins-Minerals (PRESERVISION/LUTEIN PO), Take by mouth 2 (two) times daily., Disp: , Rfl:  .  nitroGLYCERIN (NITROSTAT) 0.4 MG SL tablet, Place 0.4 mg under the tongue every 5 (five) minutes as needed for chest pain., Disp: , Rfl:  .  pantoprazole (PROTONIX) 40 MG tablet, Take 1 tablet (40 mg total) by mouth daily., Disp: 90 tablet, Rfl: 0 .  polyethylene glycol (MIRALAX / GLYCOLAX) packet, Take 17 g by mouth daily., Disp: , Rfl:  .  pravastatin (PRAVACHOL) 40 MG tablet, Take 1 tablet (40 mg total) by mouth daily., Disp: 90 tablet, Rfl: 0 .  sertraline (ZOLOFT) 100 MG tablet, Take 1.5 tablets (150 mg total) by mouth daily., Disp: 45 tablet, Rfl: 2  Allergies  Allergen Reactions  . Baclofen   . Sulfamethoxazole-Trimethoprim      Review of Systems  Musculoskeletal: Positive for back pain.  Neurological:  Negative for sensory change and focal weakness.      Objective  Filed Vitals:   09/28/14 0914  BP: 114/71  Pulse: 76  Temp: 98 F (36.7 C)  TempSrc: Oral  Resp: 17  Height: 5' 10" (1.778 m)  Weight: 196 lb 1.6 oz (88.95 kg)  SpO2: 93%    Physical Exam  Constitutional: He is oriented to person, place, and time and well-developed, well-nourished, and in no distress.  Cardiovascular: Normal rate and regular rhythm.   Pulmonary/Chest: Effort normal and breath sounds normal.  Musculoskeletal:       Lumbar back: He exhibits tenderness and pain.       Back:  Neurological: He is alert and oriented to person, place, and time.  Reflex Scores:      Patellar reflexes are 2+ on the right side and 2+ on the left side. Nursing note and vitals reviewed.   Assessment & Plan  1. Compression fracture of L4 lumbar vertebra, closed, initial encounter MRI findings reviewed in detail with patient and his spouse. Referral to neurosurgery for further evaluation. - Ambulatory referral to Neurosurgery   Syed Asad A. [facility] 09/28/2014 9:27 AM  

## 2014-10-01 ENCOUNTER — Telehealth: Payer: Self-pay | Admitting: Family Medicine

## 2014-10-01 MED ORDER — LISINOPRIL 2.5 MG PO TABS: 2.5000 mg | ORAL_TABLET | Freq: Every day | ORAL | Status: DC

## 2014-10-01 NOTE — Telephone Encounter (Signed)
Medication has been refilled and sent to [facility] [address] 

## 2014-10-01 NOTE — Telephone Encounter (Signed)
[place]

## 2014-10-04 ENCOUNTER — Telehealth: Payer: Self-pay | Admitting: Family Medicine

## 2014-10-04 NOTE — Telephone Encounter (Signed)
Dr. Manuella Ghazi has returned patient call.

## 2014-10-04 NOTE — Telephone Encounter (Signed)
[name] (wife) is requesting a return call today, hopefully before you leave going home

## 2014-10-08 ENCOUNTER — Ambulatory Visit: Payer: Medicare Other | Admitting: Cardiovascular Disease

## 2014-10-09 ENCOUNTER — Other Ambulatory Visit: Payer: Self-pay | Admitting: Neurosurgery

## 2014-10-09 DIAGNOSIS — M7130 Other bursal cyst, unspecified site: Secondary | ICD-10-CM

## 2014-10-15 ENCOUNTER — Telehealth: Payer: Self-pay | Admitting: Family Medicine

## 2014-10-15 DIAGNOSIS — K219 Gastro-esophageal reflux disease without esophagitis: Secondary | ICD-10-CM

## 2014-10-15 DIAGNOSIS — E785 Hyperlipidemia, unspecified: Secondary | ICD-10-CM

## 2014-10-15 MED ORDER — PANTOPRAZOLE SODIUM 40 MG PO TBEC: 40.0000 mg | DELAYED_RELEASE_TABLET | Freq: Every day | ORAL | Status: DC

## 2014-10-15 MED ORDER — CLONAZEPAM 0.5 MG PO TABS: 0.5000 mg | ORAL_TABLET | ORAL | Status: DC | PRN

## 2014-10-15 MED ORDER — PRAVASTATIN SODIUM 40 MG PO TABS: 40.0000 mg | ORAL_TABLET | Freq: Every day | ORAL | Status: DC

## 2014-10-15 NOTE — Telephone Encounter (Signed)
Pt needs refill on Clonezepam, Prevastatin, and Tantotrazole. [facility] [address].

## 2014-10-15 NOTE — Telephone Encounter (Signed)
Medication has been sent to [address]

## 2014-10-16 ENCOUNTER — Ambulatory Visit
Admission: RE | Admit: 2014-10-16 | Discharge: 2014-10-16 | Disposition: A | Discharge status: Home / Self Care | Payer: Medicare Other | Source: Ambulatory Visit | Attending: Neurosurgery | Admitting: Neurosurgery

## 2014-10-16 DIAGNOSIS — M7130 Other bursal cyst, unspecified site: Secondary | ICD-10-CM

## 2014-10-16 MED ORDER — IOHEXOL 180 MG/ML  SOLN
1.0000 mL | Freq: Once | INTRAMUSCULAR | Status: DC | PRN
  Administered 2014-10-16: 1 mL via INTRA_ARTICULAR

## 2014-10-16 MED ORDER — METHYLPREDNISOLONE ACETATE 40 MG/ML INJ SUSP (RADIOLOG
80.0000 mg | Freq: Once | INTRAMUSCULAR | Status: AC
  Administered 2014-10-16: 80 mg via INTRA_ARTICULAR

## 2014-10-16 NOTE — Discharge Instructions (Signed)

## 2014-10-23 ENCOUNTER — Ambulatory Visit: Payer: Medicare Other | Admitting: Family Medicine

## 2014-10-24 ENCOUNTER — Encounter: Payer: Self-pay | Admitting: Family Medicine

## 2014-10-24 ENCOUNTER — Ambulatory Visit (INDEPENDENT_AMBULATORY_CARE_PROVIDER_SITE_OTHER): Payer: Medicare Other | Admitting: Family Medicine

## 2014-10-24 VITALS — BP 143/76 | HR 74 | Temp 98.1°F | Resp 17 | Ht 70.0 in | Wt 194.1 lb

## 2014-10-24 DIAGNOSIS — I6529 Occlusion and stenosis of unspecified carotid artery: Secondary | ICD-10-CM | POA: Diagnosis not present

## 2014-10-24 DIAGNOSIS — Z23 Encounter for immunization: Secondary | ICD-10-CM | POA: Diagnosis not present

## 2014-10-24 DIAGNOSIS — I2510 Atherosclerotic heart disease of native coronary artery without angina pectoris: Secondary | ICD-10-CM

## 2014-10-24 DIAGNOSIS — M545 Low back pain, unspecified: Secondary | ICD-10-CM

## 2014-10-24 DIAGNOSIS — G8929 Other chronic pain: Secondary | ICD-10-CM

## 2014-10-24 DIAGNOSIS — F411 Generalized anxiety disorder: Secondary | ICD-10-CM

## 2014-10-24 MED ORDER — ISOSORBIDE MONONITRATE ER 30 MG PO TB24: 30.0000 mg | ORAL_TABLET | Freq: Every day | ORAL | Status: DC

## 2014-10-24 MED ORDER — CLONAZEPAM 0.5 MG PO TABS: 0.5000 mg | ORAL_TABLET | Freq: Two times a day (BID) | ORAL | Status: DC | PRN

## 2014-10-24 MED ORDER — HYDROCODONE-ACETAMINOPHEN 5-325 MG PO TABS: 1.0000 | ORAL_TABLET | Freq: Four times a day (QID) | ORAL | Status: DC | PRN

## 2014-10-24 NOTE — Progress Notes (Signed)
Name: [name]   MRN: [id number]    DOB: 10-12-1923   Date:10/24/2014       Progress Note  Subjective  Chief Complaint  Chief Complaint  Patient presents with  . Follow-up    3 mo back pain  . Back Pain  . Hypertension    Back Pain This is a chronic problem. The pain is present in the lumbar spine. The pain does not radiate (no longer radiating to the left thigh). The pain is at a severity of 4/10. The pain is mild. The symptoms are aggravated by bending and position. He has tried analgesics for the symptoms.  Anxiety Presents for follow-up visit. Symptoms include depressed mood, excessive worry, insomnia and nervous/anxious behavior. Patient reports no panic.   Past treatments include benzodiazephines. The treatment provided moderate relief. Compliance with prior treatments has been good.  patient is  On clonazepam 0.5 mg twice daily as needed for anxiety but his last prescription was written for once a day as needed. He is requesting to increase the dosage back to twice a day as needed for optimal control of symptoms of anxiety.  Past Medical History  Diagnosis Date  . Carotid stenosis   . Lymphoblastic lymphoma   . Hx of [name]   . Coronary artery disease   . Dyslipidemia   . Hypertension   . Chronic back pain   . Anxiety   . GERD (gastroesophageal reflux disease)   . Multiple myeloma   . BPH (benign prostatic hypertrophy)   . Pleural effusion     left  . Inguinal hernia   . Sleep apnea   . Major depression   . Hyperlipidemia     Past Surgical History  Procedure Laterality Date  . Colonoscopy    . Appendectomy    . Hip fracture surgery    . Cardiac catheterization  2002  . Cardiac catheterization  06/2013    Family History  Problem Relation Age of Onset  . Heart disease Mother   . Stroke Mother   . Diabetes Father   . Dementia Father   . Stroke Sister   . Stroke Brother   . Stroke Sister   . Stroke Sister   . Stroke Brother   . Stroke Brother      Social History   Social History  . Marital Status: Married    Spouse Name: N/A  . Number of Children: N/A  . Years of Education: N/A   Occupational History  . Not on file.   Social History Main Topics  . Smoking status: Former Smoker -- 2.00 packs/day for 7 years    Types: Cigarettes    Quit date: 09/17/1953  . Smokeless tobacco: Never Used  . Alcohol Use: No  . Drug Use: No  . Sexual Activity: Not Currently   Other Topics Concern  . Not on file   Social History Narrative     Current outpatient prescriptions:  .  aspirin EC 81 MG tablet, Take 1 tablet (81 mg total) by mouth daily., Disp: 90 tablet, Rfl: 3 .  Cholecalciferol (VITAMIN D3) 5000 UNITS CAPS, Take by mouth daily., Disp: , Rfl:  .  clonazePAM (KLONOPIN) 0.5 MG tablet, Take 1 tablet (0.5 mg total) by mouth as needed for anxiety., Disp: 30 tablet, Rfl: 0 .  doxazosin (CARDURA) 1 MG tablet, Take 1 tablet (1 mg total) by mouth daily., Disp: 30 tablet, Rfl: 3 .  HYDROcodone-acetaminophen (NORCO/VICODIN) 5-325 MG per tablet, Take 1 tablet  by mouth every 6 (six) hours as needed for moderate pain or severe pain., Disp: 120 tablet, Rfl: 0 .  isosorbide mononitrate (IMDUR) 30 MG 24 hr tablet, Take 1 tablet (30 mg total) by mouth daily., Disp: 90 tablet, Rfl: 0 .  lisinopril (PRINIVIL,ZESTRIL) 2.5 MG tablet, Take 1 tablet (2.5 mg total) by mouth daily., Disp: 30 tablet, Rfl: 1 .  metoprolol tartrate (LOPRESSOR) 25 MG tablet, Take 1 tablet (25 mg total) by mouth 2 (two) times daily., Disp: 180 tablet, Rfl: 1 .  Multiple Vitamins-Minerals (PRESERVISION/LUTEIN PO), Take by mouth 2 (two) times daily., Disp: , Rfl:  .  nitroGLYCERIN (NITROSTAT) 0.4 MG SL tablet, Place 0.4 mg under the tongue every 5 (five) minutes as needed for chest pain., Disp: , Rfl:  .  pantoprazole (PROTONIX) 40 MG tablet, Take 1 tablet (40 mg total) by mouth daily., Disp: 90 tablet, Rfl: 0 .  polyethylene glycol (MIRALAX / GLYCOLAX) packet, Take 17 g by  mouth daily., Disp: , Rfl:  .  pravastatin (PRAVACHOL) 40 MG tablet, Take 1 tablet (40 mg total) by mouth daily., Disp: 90 tablet, Rfl: 0 .  sertraline (ZOLOFT) 100 MG tablet, Take 1.5 tablets (150 mg total) by mouth daily., Disp: 45 tablet, Rfl: 2 .  acetaminophen (TYLENOL) 500 MG tablet, Take 500 mg by mouth 2 (two) times daily., Disp: , Rfl:   Allergies  Allergen Reactions  . Baclofen   . Sulfamethoxazole-Trimethoprim      Review of Systems  Musculoskeletal: Positive for back pain.  Psychiatric/Behavioral: The patient is nervous/anxious and has insomnia.    Objective  Filed Vitals:   10/24/14 1213  BP: 143/76  Pulse: 74  Temp: 98.1 F (36.7 C)  TempSrc: Oral  Resp: 17  Height: 5' 10" (1.778 m)  Weight: 194 lb 1.6 oz (88.043 kg)  SpO2: 93%    Physical Exam  Constitutional: He is oriented to person, place, and time and well-developed, well-nourished, and in no distress.  Cardiovascular: Normal rate and regular rhythm.   Pulmonary/Chest: Effort normal and breath sounds normal.  Musculoskeletal:  Abdominal binder for stabilization of lumbar spine.  Neurological: He is alert and oriented to person, place, and time.  Psychiatric: Memory, affect and judgment normal.  Nursing note and vitals reviewed.   Assessment & Plan   1. Need for immunization against influenza Vaccine against influenza administered to patient.  2. Generalized anxiety disorder Change clonazepam to 0.5 mg by mouth twice a day when necessary.patient is aware of the dependence potential of benzodiazepines and is taking the medication as directed.  - clonazePAM (KLONOPIN) 0.5 MG tablet; Take 1 tablet (0.5 mg total) by mouth 2 (two) times daily as needed for anxiety.  Dispense: 60 tablet; Refill: 2  3. Chronic LBP  Patient has received an injection in his lower back resulting in significant relief of lower back pain.he will continue on Vicodin 1 tablet up to 4 times a day as needed. Refills provided  and follow-up in one month. - HYDROcodone-acetaminophen (NORCO/VICODIN) 5-325 MG per tablet; Take 1 tablet by mouth every 6 (six) hours as needed for moderate pain or severe pain.  Dispense: 120 tablet; Refill: 0  4. Coronary artery disease involving native coronary artery of native heart without angina pectoris  - isosorbide mononitrate (IMDUR) 30 MG 24 hr tablet; Take 1 tablet (30 mg total) by mouth daily.  Dispense: 90 tablet; Refill: 1   Syed Asad A. Shah [facility] 10/24/2014 12:30 PM  

## 2014-10-30 ENCOUNTER — Encounter: Payer: Self-pay | Admitting: Family Medicine

## 2014-10-30 ENCOUNTER — Ambulatory Visit (INDEPENDENT_AMBULATORY_CARE_PROVIDER_SITE_OTHER): Payer: Medicare Other | Admitting: Family Medicine

## 2014-10-30 ENCOUNTER — Ambulatory Visit: Payer: Medicare Other | Admitting: Family Medicine

## 2014-10-30 VITALS — BP 140/74 | HR 84 | Temp 98.0°F | Resp 18 | Ht 70.0 in | Wt 193.5 lb

## 2014-10-30 DIAGNOSIS — B029 Zoster without complications: Secondary | ICD-10-CM | POA: Diagnosis not present

## 2014-10-30 DIAGNOSIS — I6529 Occlusion and stenosis of unspecified carotid artery: Secondary | ICD-10-CM

## 2014-10-30 MED ORDER — PREDNISONE 20 MG PO TABS: 20.0000 mg | ORAL_TABLET | Freq: Two times a day (BID) | ORAL | Status: DC

## 2014-10-30 MED ORDER — VALACYCLOVIR HCL 1 G PO TABS: 1000.0000 mg | ORAL_TABLET | Freq: Two times a day (BID) | ORAL | Status: DC

## 2014-10-30 NOTE — Progress Notes (Signed)
Name: [name]   MRN: [id number]    DOB: Feb 15, 1924   Date:10/30/2014       Progress Note  Subjective  Chief Complaint  Chief Complaint  Patient presents with  . Rash    on right side of face x4 days    HPI   Rash  Patient presents with a rash on the right as well as the left side of his face and neck for the past 4 days. Began as a tingling and burning sensation and then initially had some small bumps with clear fluid. He has a history of [name] on the same side. This been no fever or chills. Past Medical History  Diagnosis Date  . Carotid stenosis   . Lymphoblastic lymphoma   . Hx of [name]   . Coronary artery disease   . Dyslipidemia   . Hypertension   . Chronic back pain   . Anxiety   . GERD (gastroesophageal reflux disease)   . Multiple myeloma   . BPH (benign prostatic hypertrophy)   . Pleural effusion     left  . Inguinal hernia   . Sleep apnea   . Major depression   . Hyperlipidemia     Social History  Substance Use Topics  . Smoking status: Former Smoker -- 2.00 packs/day for 7 years    Types: Cigarettes    Quit date: 09/17/1953  . Smokeless tobacco: Never Used  . Alcohol Use: No     Current outpatient prescriptions:  .  acetaminophen (TYLENOL) 500 MG tablet, Take 500 mg by mouth 2 (two) times daily., Disp: , Rfl:  .  aspirin EC 81 MG tablet, Take 1 tablet (81 mg total) by mouth daily., Disp: 90 tablet, Rfl: 3 .  Cholecalciferol (D 5000) 5000 UNITS TABS, Take by mouth., Disp: , Rfl:  .  Cholecalciferol (VITAMIN D3) 5000 UNITS CAPS, Take by mouth daily., Disp: , Rfl:  .  clonazePAM (KLONOPIN) 0.5 MG tablet, Take 1 tablet (0.5 mg total) by mouth 2 (two) times daily as needed for anxiety., Disp: 60 tablet, Rfl: 2 .  doxazosin (CARDURA) 1 MG tablet, Take 1 tablet (1 mg total) by mouth daily., Disp: 30 tablet, Rfl: 3 .  HYDROcodone-acetaminophen (NORCO/VICODIN) 5-325 MG per tablet, Take 1 tablet by mouth every 6 (six) hours as needed for  moderate pain or severe pain., Disp: 120 tablet, Rfl: 0 .  isosorbide mononitrate (IMDUR) 30 MG 24 hr tablet, Take 1 tablet (30 mg total) by mouth daily., Disp: 90 tablet, Rfl: 1 .  lisinopril (PRINIVIL,ZESTRIL) 2.5 MG tablet, Take 1 tablet (2.5 mg total) by mouth daily., Disp: 30 tablet, Rfl: 1 .  metoprolol tartrate (LOPRESSOR) 25 MG tablet, Take 1 tablet (25 mg total) by mouth 2 (two) times daily., Disp: 180 tablet, Rfl: 1 .  Multiple Vitamins-Minerals (PRESERVISION/LUTEIN PO), Take by mouth 2 (two) times daily., Disp: , Rfl:  .  nitroGLYCERIN (NITROSTAT) 0.4 MG SL tablet, Place 0.4 mg under the tongue every 5 (five) minutes as needed for chest pain., Disp: , Rfl:  .  pantoprazole (PROTONIX) 40 MG tablet, Take 1 tablet (40 mg total) by mouth daily., Disp: 90 tablet, Rfl: 0 .  polyethylene glycol (MIRALAX / GLYCOLAX) packet, Take 17 g by mouth daily., Disp: , Rfl:  .  pravastatin (PRAVACHOL) 40 MG tablet, Take 1 tablet (40 mg total) by mouth daily., Disp: 90 tablet, Rfl: 0 .  sertraline (ZOLOFT) 100 MG tablet, Take 1.5 tablets (150 mg total) by  mouth daily., Disp: 45 tablet, Rfl: 2 .  predniSONE (DELTASONE) 20 MG tablet, Take 1 tablet (20 mg total) by mouth 2 (two) times daily with a meal., Disp: 10 tablet, Rfl: 0 .  valACYclovir (VALTREX) 1000 MG tablet, Take 1 tablet (1,000 mg total) by mouth 2 (two) times daily., Disp: 20 tablet, Rfl: 0  Allergies  Allergen Reactions  . Sulfamethoxazole-Trimethoprim Nausea Only  . Baclofen     Review of Systems  Constitutional: Negative for fever, chills and weight loss.  HENT: Negative for congestion, hearing loss, sore throat and tinnitus.   Eyes: Negative for blurred vision, double vision and redness.  Respiratory: Negative for cough, hemoptysis and shortness of breath.   Cardiovascular: Negative for chest pain, palpitations, orthopnea, claudication and leg swelling.  Gastrointestinal: Negative for heartburn, nausea, vomiting, diarrhea, constipation  and blood in stool.  Genitourinary: Negative for dysuria, urgency, frequency and hematuria.  Musculoskeletal: Negative for myalgias, back pain, joint pain, falls and neck pain.  Skin: Positive for itching and rash.  Neurological: Positive for sensory change. Negative for dizziness, tingling, tremors, focal weakness, seizures, loss of consciousness, weakness and headaches.       Right-sided [name]  Endo/Heme/Allergies: Does not bruise/bleed easily.  Psychiatric/Behavioral: Negative for depression and substance abuse. The patient is not nervous/anxious and does not have insomnia.      Objective  Filed Vitals:   10/30/14 1152  BP: 140/74  Pulse: 84  Temp: 98 F (36.7 C)  TempSrc: Oral  Resp: 18  Height: 5' 10" (1.778 m)  Weight: 193 lb 8 oz (87.771 kg)  SpO2: 98%     Physical Exam  Constitutional: He is well-developed, well-nourished, and in no distress.  Only male in no acute distress  HENT:  Head: Normocephalic.  Eyes: EOM are normal. Pupils are equal, round, and reactive to light.  Neck: Normal range of motion. Neck supple. No thyromegaly present.  Cardiovascular: Normal rate, regular rhythm and normal heart sounds.   No murmur heard. Pulmonary/Chest: Effort normal and breath sounds normal. No respiratory distress. He has no wheezes.  Musculoskeletal: He exhibits no edema.  Lymphadenopathy:    He has no cervical adenopathy.  Neurological: No cranial nerve deficit. Coordination normal.  Skin: No rash noted.  Slightly vesicular rash with some excoriations and hyperpigmentation noted on the division of the facial nerve on the right and is cervical proximal C2-3 as well.  Psychiatric: Affect and judgment normal.      Assessment & Plan  1. Herpes zoster  - predniSONE (DELTASONE) 20 MG tablet; Take 1 tablet (20 mg total) by mouth 2 (two) times daily with a meal.  Dispense: 10 tablet; Refill: 0 - valACYclovir (VALTREX) 1000 MG tablet; Take 1 tablet (1,000 mg total)  by mouth 2 (two) times daily.  Dispense: 20 tablet; Refill: 0

## 2014-11-13 ENCOUNTER — Ambulatory Visit (INDEPENDENT_AMBULATORY_CARE_PROVIDER_SITE_OTHER): Payer: Medicare Other | Admitting: Cardiovascular Disease

## 2014-11-13 ENCOUNTER — Encounter: Payer: Self-pay | Admitting: Cardiovascular Disease

## 2014-11-13 VITALS — BP 136/70 | HR 75 | Ht 70.0 in | Wt 197.0 lb

## 2014-11-13 DIAGNOSIS — I10 Essential (primary) hypertension: Secondary | ICD-10-CM

## 2014-11-13 DIAGNOSIS — E785 Hyperlipidemia, unspecified: Secondary | ICD-10-CM | POA: Diagnosis not present

## 2014-11-13 DIAGNOSIS — I6529 Occlusion and stenosis of unspecified carotid artery: Secondary | ICD-10-CM

## 2014-11-13 DIAGNOSIS — I2510 Atherosclerotic heart disease of native coronary artery without angina pectoris: Secondary | ICD-10-CM | POA: Diagnosis not present

## 2014-11-13 NOTE — Assessment & Plan Note (Signed)
Blood pressure is controlled on current medications. 

## 2014-11-13 NOTE — Progress Notes (Signed)
Primary care physician: Dr. Keith Rake  HPI  This is a pleasant 81 year old man who is here today for a follow-up visit.  He has known history of coronary artery disease status post LAD PCI with drug-eluting stent placement in May of 2015 at [facility], hypertension and hyperlipidemia. His cardiac cath showed subtotally occluded RCA with left-to-right collaterals, 30% ostial left circumflex and 50-70% proximal LAD stenosis with an IFR ratio of 0.82. The LAD was stented with a 3.5 x 24 mm Promus drug-eluting stent.  He fell in June, 2015 and had a fractured left hip which ultimately require surgery. He lives at [facility] [place]. His mobility has gradually improved and overall he is feeling better. He denies any chest pain or shortness of breath. He had a follow-up appointment at [facility] in May and his Plavix was discontinued. He reports having carotid Doppler done at Dr. Laurelyn Sickle office which showed a moderate stenosis.  Allergies  Allergen Reactions  . Sulfamethoxazole-Trimethoprim Nausea Only  . Baclofen      Current Outpatient Prescriptions on File Prior to Visit  Medication Sig Dispense Refill  . acetaminophen (TYLENOL) 500 MG tablet Take 500 mg by mouth 2 (two) times daily.    Marland Kitchen aspirin EC 81 MG tablet Take 1 tablet (81 mg total) by mouth daily. 90 tablet 3  . Cholecalciferol (D 5000) 5000 UNITS TABS Take by mouth.    . Cholecalciferol (VITAMIN D3) 5000 UNITS CAPS Take by mouth daily.    . clonazePAM (KLONOPIN) 0.5 MG tablet Take 1 tablet (0.5 mg total) by mouth 2 (two) times daily as needed for anxiety. 60 tablet 2  . doxazosin (CARDURA) 1 MG tablet Take 1 tablet (1 mg total) by mouth daily. 30 tablet 3  . HYDROcodone-acetaminophen (NORCO/VICODIN) 5-325 MG per tablet Take 1 tablet by mouth every 6 (six) hours as needed for moderate pain or severe pain. 120 tablet 0  . isosorbide mononitrate (IMDUR) 30 MG 24 hr tablet Take 1 tablet (30 mg total) by mouth daily. 90 tablet 1  . lisinopril  (PRINIVIL,ZESTRIL) 2.5 MG tablet Take 1 tablet (2.5 mg total) by mouth daily. 30 tablet 1  . metoprolol tartrate (LOPRESSOR) 25 MG tablet Take 1 tablet (25 mg total) by mouth 2 (two) times daily. 180 tablet 1  . Multiple Vitamins-Minerals (PRESERVISION/LUTEIN PO) Take by mouth 2 (two) times daily.    . nitroGLYCERIN (NITROSTAT) 0.4 MG SL tablet Place 0.4 mg under the tongue every 5 (five) minutes as needed for chest pain.    . pantoprazole (PROTONIX) 40 MG tablet Take 1 tablet (40 mg total) by mouth daily. 90 tablet 0  . polyethylene glycol (MIRALAX / GLYCOLAX) packet Take 17 g by mouth daily.    . pravastatin (PRAVACHOL) 40 MG tablet Take 1 tablet (40 mg total) by mouth daily. 90 tablet 0  . sertraline (ZOLOFT) 100 MG tablet Take 1.5 tablets (150 mg total) by mouth daily. 45 tablet 2   No current facility-administered medications on file prior to visit.     Past Medical History  Diagnosis Date  . Carotid stenosis   . Lymphoblastic lymphoma   . Hx of [name]   . Coronary artery disease   . Dyslipidemia   . Hypertension   . Chronic back pain   . Anxiety   . GERD (gastroesophageal reflux disease)   . Multiple myeloma   . BPH (benign prostatic hypertrophy)   . Pleural effusion     left  . Inguinal hernia   .  Sleep apnea   . Major depression   . Hyperlipidemia      Past Surgical History  Procedure Laterality Date  . Colonoscopy    . Appendectomy    . Hip fracture surgery    . Cardiac catheterization  2002  . Cardiac catheterization  06/2013     Family History  Problem Relation Age of Onset  . Heart disease Mother   . Stroke Mother   . Diabetes Father   . Dementia Father   . Stroke Sister   . Stroke Brother   . Stroke Sister   . Stroke Sister   . Stroke Brother   . Stroke Brother      Social History   Social History  . Marital Status: Married    Spouse Name: N/A  . Number of Children: N/A  . Years of Education: N/A   Occupational History  . Not on  file.   Social History Main Topics  . Smoking status: Former Smoker -- 2.00 packs/day for 7 years    Types: Cigarettes    Quit date: 09/17/1953  . Smokeless tobacco: Never Used  . Alcohol Use: No  . Drug Use: No  . Sexual Activity: Not Currently   Other Topics Concern  . Not on file   Social History Narrative     ROS A 10 point review of system was performed. It is negative other than that mentioned in the history of present illness.   PHYSICAL EXAM   BP 136/70 mmHg  Pulse 75  Ht $R'5\' 10"'tC$  (1.778 m)  Wt 197 lb (89.359 kg)  BMI 28.27 kg/m2 Constitutional: He is oriented to person, place, and time. He appears well-developed and well-nourished. No distress.  HENT: No nasal discharge.  Head: Normocephalic and atraumatic.  Eyes: Pupils are equal and round.  No discharge. Neck: Normal range of motion. Neck supple. No JVD present. No thyromegaly present.  Cardiovascular: Normal rate, regular rhythm, normal heart sounds. Exam reveals no gallop and no friction rub. No murmur heard.  Pulmonary/Chest: Effort normal and breath sounds normal. No stridor. No respiratory distress. He has no wheezes. He has no rales. He exhibits no tenderness.  Abdominal: Soft. Bowel sounds are normal. He exhibits no distension. There is no tenderness. There is no rebound and no guarding.  Musculoskeletal: Normal range of motion. He exhibits no edema and no tenderness.  Neurological: He is alert and oriented to person, place, and time. Coordination normal.  Skin: Skin is warm and dry. No rash noted. He is not diaphoretic. No erythema. No pallor.  Psychiatric: He has a normal mood and affect. His behavior is normal. Judgment and thought content normal.        ASSESSMENT AND PLAN

## 2014-11-13 NOTE — Assessment & Plan Note (Signed)
No previous history of stroke. He reports having a carotid Doppler a moderate stenosis. Recommend repeat carotid Doppler in 6 months. Continue aggressive treatment of risk factors.

## 2014-11-13 NOTE — Assessment & Plan Note (Signed)
Lab Results  Component Value Date   CHOL 76* 07/23/2014   HDL 24* 07/23/2014   LDLCALC 36 07/23/2014   TRIG 79 07/23/2014   CHOLHDL 3.2 07/23/2014   Continue treatment with pravastatin. I do not recommend a more potent statin given that his cholesterol is overall very low.

## 2014-11-13 NOTE — Patient Instructions (Signed)
Medication Instructions:  Your physician recommends that you continue on your current medications as directed. Please refer to the Current Medication list given to you today.   Labwork: none  Testing/Procedures: Your physician has requested that you have a carotid duplex. This test is an ultrasound of the carotid arteries in your neck. It looks at blood flow through these arteries that supply the brain with blood. Allow one hour for this exam. There are no restrictions or special instructions.    Follow-Up: Your physician wants you to follow-up in: six months with Dr. Arida.  You will receive a reminder letter in the mail two months in advance. If you don't receive a letter, please call our office to schedule the follow-up appointment.   Any Other Special Instructions Will Be Listed Below (If Applicable).   

## 2014-11-13 NOTE — Assessment & Plan Note (Signed)
He is doing well overall with no symptoms suggestive of angina. Continue medical therapy. 

## 2014-11-14 ENCOUNTER — Other Ambulatory Visit: Payer: Self-pay | Admitting: Family Medicine

## 2014-11-14 DIAGNOSIS — F331 Major depressive disorder, recurrent, moderate: Secondary | ICD-10-CM

## 2014-11-14 MED ORDER — SERTRALINE HCL 100 MG PO TABS: 150.0000 mg | ORAL_TABLET | Freq: Every day | ORAL | Status: DC

## 2014-11-23 ENCOUNTER — Ambulatory Visit: Payer: Medicare Other | Admitting: Family Medicine

## 2014-11-28 ENCOUNTER — Encounter: Payer: Self-pay | Admitting: Family Medicine

## 2014-11-28 ENCOUNTER — Ambulatory Visit
Admission: RE | Admit: 2014-11-28 | Discharge: 2014-11-28 | Disposition: A | Discharge status: Home / Self Care | Payer: Medicare Other | Source: Ambulatory Visit | Attending: Family Medicine | Admitting: Family Medicine

## 2014-11-28 ENCOUNTER — Ambulatory Visit (INDEPENDENT_AMBULATORY_CARE_PROVIDER_SITE_OTHER): Payer: Medicare Other | Admitting: Family Medicine

## 2014-11-28 VITALS — BP 134/78 | HR 89 | Temp 98.1°F | Resp 18 | Ht 70.0 in | Wt 197.5 lb

## 2014-11-28 DIAGNOSIS — I6529 Occlusion and stenosis of unspecified carotid artery: Secondary | ICD-10-CM

## 2014-11-28 DIAGNOSIS — G8929 Other chronic pain: Secondary | ICD-10-CM

## 2014-11-28 DIAGNOSIS — M545 Low back pain, unspecified: Secondary | ICD-10-CM

## 2014-11-28 DIAGNOSIS — Z23 Encounter for immunization: Secondary | ICD-10-CM | POA: Diagnosis not present

## 2014-11-28 DIAGNOSIS — S60221A Contusion of right hand, initial encounter: Secondary | ICD-10-CM | POA: Insufficient documentation

## 2014-11-28 DIAGNOSIS — W19XXXA Unspecified fall, initial encounter: Secondary | ICD-10-CM | POA: Diagnosis not present

## 2014-11-28 MED ORDER — HYDROCODONE-ACETAMINOPHEN 5-325 MG PO TABS: 1.0000 | ORAL_TABLET | Freq: Four times a day (QID) | ORAL | Status: DC | PRN

## 2014-11-28 MED ORDER — LISINOPRIL 2.5 MG PO TABS: 2.5000 mg | ORAL_TABLET | Freq: Every day | ORAL | Status: DC

## 2014-11-28 MED ORDER — CLINDAMYCIN HCL 300 MG PO CAPS: 300.0000 mg | ORAL_CAPSULE | Freq: Four times a day (QID) | ORAL | Status: DC

## 2014-11-28 NOTE — Progress Notes (Signed)
Name: [name]   MRN: [id number]    DOB: [birth year]   Date:11/28/2014       Progress Note  Subjective  Chief Complaint  Chief Complaint  Patient presents with  . Fall  . Hyperlipidemia    Fall The accident occurred 3 to 5 days ago (Sunday, October 9th, 2016). The fall occurred while walking. Distance fallen: 4 feet. He landed on carpet. The volume of blood lost was moderate. Point of impact: right hand and right knee. The pain is present in the right hand and right knee. The pain is moderate. Pertinent negatives include no fever. He has tried immobilization and rest for the symptoms.   Patient seen at the local Urgent care and evaluated for bruise to his right hand and right knee but according to the patient, no further workup was performed other than dressing the wound.  Past Medical History  Diagnosis Date  . Carotid stenosis   . Lymphoblastic lymphoma ([place])   . Hx of [name]   . Coronary artery disease   . Dyslipidemia   . Hypertension   . Chronic back pain   . Anxiety   . GERD (gastroesophageal reflux disease)   . Multiple myeloma ([place])   . BPH (benign prostatic hypertrophy)   . Pleural effusion     left  . Inguinal hernia   . Sleep apnea   . Major depression ([place])   . Hyperlipidemia     Past Surgical History  Procedure Laterality Date  . Colonoscopy    . Appendectomy    . Hip fracture surgery    . Cardiac catheterization  2002  . Cardiac catheterization  06/2013    Family History  Problem Relation Age of Onset  . Heart disease Mother   . Stroke Mother   . Diabetes Father   . Dementia Father   . Stroke Sister   . Stroke Brother   . Stroke Sister   . Stroke Sister   . Stroke Brother   . Stroke Brother     Social History   Social History  . Marital Status: Married    Spouse Name: N/A  . Number of Children: N/A  . Years of Education: N/A   Occupational History  . Not on file.   Social History Main Topics  . Smoking status: Former Smoker  -- 2.00 packs/day for 7 years    Types: Cigarettes    Quit date: 09/17/1953  . Smokeless tobacco: Never Used  . Alcohol Use: No  . Drug Use: No  . Sexual Activity: Not Currently   Other Topics Concern  . Not on file   Social History Narrative     Current outpatient prescriptions:  .  acetaminophen (TYLENOL) 500 MG tablet, Take 500 mg by mouth 2 (two) times daily., Disp: , Rfl:  .  aspirin EC 81 MG tablet, Take 1 tablet (81 mg total) by mouth daily., Disp: 90 tablet, Rfl: 3 .  Cholecalciferol (D 5000) 5000 UNITS TABS, Take by mouth., Disp: , Rfl:  .  Cholecalciferol (VITAMIN D3) 5000 UNITS CAPS, Take by mouth daily., Disp: , Rfl:  .  clonazePAM (KLONOPIN) 0.5 MG tablet, Take 1 tablet (0.5 mg total) by mouth 2 (two) times daily as needed for anxiety., Disp: 60 tablet, Rfl: 2 .  doxazosin (CARDURA) 1 MG tablet, Take 1 tablet (1 mg total) by mouth daily., Disp: 30 tablet, Rfl: 3 .  HYDROcodone-acetaminophen (NORCO/VICODIN) 5-325 MG per tablet, Take 1 tablet by mouth every  6 (six) hours as needed for moderate pain or severe pain., Disp: 120 tablet, Rfl: 0 .  isosorbide mononitrate (IMDUR) 30 MG 24 hr tablet, Take 1 tablet (30 mg total) by mouth daily., Disp: 90 tablet, Rfl: 1 .  lisinopril (PRINIVIL,ZESTRIL) 2.5 MG tablet, Take 1 tablet (2.5 mg total) by mouth daily., Disp: 30 tablet, Rfl: 1 .  metoprolol tartrate (LOPRESSOR) 25 MG tablet, Take 1 tablet (25 mg total) by mouth 2 (two) times daily., Disp: 180 tablet, Rfl: 1 .  Multiple Vitamins-Minerals (PRESERVISION/LUTEIN PO), Take by mouth 2 (two) times daily., Disp: , Rfl:  .  nitroGLYCERIN (NITROSTAT) 0.4 MG SL tablet, Place 0.4 mg under the tongue every 5 (five) minutes as needed for chest pain., Disp: , Rfl:  .  pantoprazole (PROTONIX) 40 MG tablet, Take 1 tablet (40 mg total) by mouth daily., Disp: 90 tablet, Rfl: 0 .  polyethylene glycol (MIRALAX / GLYCOLAX) packet, Take 17 g by mouth daily., Disp: , Rfl:  .  pravastatin (PRAVACHOL)  40 MG tablet, Take 1 tablet (40 mg total) by mouth daily., Disp: 90 tablet, Rfl: 0 .  sertraline (ZOLOFT) 100 MG tablet, Take 1.5 tablets (150 mg total) by mouth daily., Disp: 45 tablet, Rfl: 2  Allergies  Allergen Reactions  . Sulfamethoxazole-Trimethoprim Nausea Only  . Baclofen    Review of Systems  Constitutional: Negative for fever, chills, weight loss and malaise/fatigue.  Skin: Negative for itching and rash.    Objective  Filed Vitals:   11/28/14 1519  BP: 134/78  Pulse: 89  Temp: 98.1 F (36.7 C)  TempSrc: Oral  Resp: 18  Height: _0  (1.778 m)  Weight: 197 lb 8 oz (89.585 kg)  SpO2: 95%    Physical Exam  Constitutional: He is well-developed, well-nourished, and in no distress.  Musculoskeletal:       Right lower leg: He exhibits no tenderness and no swelling.  Deep contusion/wound over the base of right ring finger. No bleeding. Right hand appears swollen compared to left but has good ROM, somewhat decreased grip strength, and hematoma.   Right knee has a healing contusion at the anterioir aspect, dried blood visible, no active bleeding, no surrounding erythema.  Nursing note and vitals reviewed.  Assessment & Plan  1. Contusion of right hand, initial encounter Name x-ray of the right hand to rule out fracture or any acute injury. After cleansing the wound with normal saline, dressing was applied. Tetanus shot was provided and an antibiotic prescription was given in case he develops an infection due to break in the skin. Follow-up after review of x-ray. - DG Hand Complete Right; Future - clindamycin (CLEOCIN) 300 MG capsule; Take 1 capsule (300 mg total) by mouth 4 (four) times daily.  Dispense: 28 capsule; Refill: 0 - Tdap vaccine greater than or equal to 7yo IM  2. Need for diphtheria-tetanus-pertussis (Tdap) vaccine, adult/adolescent  - Tdap vaccine greater than or equal to 7yo IM  3. Chronic LBP Chronic low back pain stable on daily opioid therapy.  Patient compliant with controlled substances agreement and understands the dependence potential and side effects of opioids. Refills provided and follow-up in one month. - HYDROcodone-acetaminophen (NORCO/VICODIN) 5-325 MG tablet; Take 1 tablet by mouth every 6 (six) hours as needed for moderate pain or severe pain.  Dispense: 120 tablet; Refill: 0   Jenia Klepper [facility] 11/28/2014 3:41 PM

## 2014-12-24 ENCOUNTER — Ambulatory Visit (INDEPENDENT_AMBULATORY_CARE_PROVIDER_SITE_OTHER): Payer: Medicare Other | Admitting: Family Medicine

## 2014-12-24 ENCOUNTER — Encounter: Payer: Self-pay | Admitting: Family Medicine

## 2014-12-24 VITALS — BP 110/52 | HR 68 | Temp 98.0°F | Resp 12 | Wt 199.7 lb

## 2014-12-24 DIAGNOSIS — N400 Enlarged prostate without lower urinary tract symptoms: Secondary | ICD-10-CM

## 2014-12-24 DIAGNOSIS — G8929 Other chronic pain: Secondary | ICD-10-CM

## 2014-12-24 DIAGNOSIS — L989 Disorder of the skin and subcutaneous tissue, unspecified: Secondary | ICD-10-CM | POA: Diagnosis not present

## 2014-12-24 DIAGNOSIS — M545 Low back pain, unspecified: Secondary | ICD-10-CM

## 2014-12-24 DIAGNOSIS — I6529 Occlusion and stenosis of unspecified carotid artery: Secondary | ICD-10-CM

## 2014-12-24 DIAGNOSIS — E7800 Pure hypercholesterolemia, unspecified: Secondary | ICD-10-CM

## 2014-12-24 MED ORDER — PRAVASTATIN SODIUM 40 MG PO TABS: 40.0000 mg | ORAL_TABLET | Freq: Every day | ORAL | Status: DC

## 2014-12-24 MED ORDER — DOXAZOSIN MESYLATE 1 MG PO TABS: 1.0000 mg | ORAL_TABLET | Freq: Every day | ORAL | Status: DC

## 2014-12-24 MED ORDER — HYDROCODONE-ACETAMINOPHEN 5-325 MG PO TABS: 1.0000 | ORAL_TABLET | Freq: Four times a day (QID) | ORAL | Status: DC | PRN

## 2014-12-24 NOTE — Progress Notes (Signed)
Name: [name]   MRN: [id number]    DOB: 12-01-1923   Date:12/24/2014       Progress Note  Subjective  Chief Complaint  Chief Complaint  Patient presents with  . Follow-up  . Medication Refill    doxasozine  & pravastatin  . Skin Problem    patient has a questionable area on the right side of his face that is tender to the touch.     Back Pain This is a chronic problem. The problem is unchanged. The pain is present in the lumbar spine and gluteal. The pain is at a severity of 4/10. The symptoms are aggravated by bending. Pertinent negatives include no bladder incontinence, bowel incontinence, leg pain, numbness, paresis or weakness. He has tried analgesics for the symptoms. The treatment provided significant relief.  Benign Prostatic Hypertrophy This is a chronic problem. The problem is unchanged. Irritative symptoms include nocturia. Irritative symptoms do not include urgency. Obstructive symptoms include incomplete emptying. Obstructive symptoms do not include a slower stream or a weak stream. Pertinent negatives include no chills. Past treatments include doxazosin.  Hyperlipidemia This is a chronic problem. The problem is controlled. Pertinent negatives include no leg pain, myalgias or shortness of breath. Current antihyperlipidemic treatment includes statins.   Patient also has a skin lesion on the right side of his face, which his wife wants me to look at. Past Medical History  Diagnosis Date  . Carotid stenosis   . 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 Hx of [name]   . Coronary artery disease   . Dyslipidemia   . Hypertension   . Chronic back pain   . Anxiety   . GERD (gastroesophageal reflux disease)   . Multiple myeloma ([place])   . BPH (benign prostatic hypertrophy)   . Pleural effusion     left  . Inguinal hernia   . Sleep apnea   . Major depression ([place])   . Hyperlipidemia     Past Surgical History  Procedure Laterality Date  . Colonoscopy    . Appendectomy     . Hip fracture surgery    . Cardiac catheterization  2002  . Cardiac catheterization  06/2013    Family History  Problem Relation Age of Onset  . Heart disease Mother   . Stroke Mother   . Diabetes Father   . Dementia Father   . Stroke Sister   . Stroke Brother   . Stroke Sister   . Stroke Sister   . Stroke Brother   . Stroke Brother     Social History   Social History  . Marital Status: Married    Spouse Name: N/A  . Number of Children: N/A  . Years of Education: N/A   Occupational History  . Not on file.   Social History Main Topics  . Smoking status: Former Smoker -- 2.00 packs/day for 7 years    Types: Cigarettes    Quit date: 09/17/1953  . Smokeless tobacco: Never Used  . Alcohol Use: No  . Drug Use: No  . Sexual Activity: Not Currently   Other Topics Concern  . Not on file   Social History Narrative     Current outpatient prescriptions:  .  acetaminophen (TYLENOL) 500 MG tablet, Take 500 mg by mouth 2 (two) times daily., Disp: , Rfl:  .  aspirin EC 81 MG tablet, Take 1 tablet (81 mg total) by mouth daily., Disp: 90 tablet, Rfl: 3 .  Cholecalciferol (D 5000) 5000  UNITS TABS, Take by mouth., Disp: , Rfl:  .  clindamycin (CLEOCIN) 300 MG capsule, Take 1 capsule (300 mg total) by mouth 4 (four) times daily., Disp: 28 capsule, Rfl: 0 .  clonazePAM (KLONOPIN) 0.5 MG tablet, Take 1 tablet (0.5 mg total) by mouth 2 (two) times daily as needed for anxiety., Disp: 60 tablet, Rfl: 2 .  doxazosin (CARDURA) 1 MG tablet, Take 1 tablet (1 mg total) by mouth daily., Disp: 30 tablet, Rfl: 3 .  HYDROcodone-acetaminophen (NORCO/VICODIN) 5-325 MG tablet, Take 1 tablet by mouth every 6 (six) hours as needed for moderate pain or severe pain., Disp: 120 tablet, Rfl: 0 .  isosorbide mononitrate (IMDUR) 30 MG 24 hr tablet, Take 1 tablet (30 mg total) by mouth daily., Disp: 90 tablet, Rfl: 1 .  lisinopril (PRINIVIL,ZESTRIL) 2.5 MG tablet, Take 1 tablet (2.5 mg total) by mouth  daily., Disp: 30 tablet, Rfl: 1 .  metoprolol tartrate (LOPRESSOR) 25 MG tablet, Take 1 tablet (25 mg total) by mouth 2 (two) times daily., Disp: 180 tablet, Rfl: 1 .  Multiple Vitamins-Minerals (PRESERVISION/LUTEIN PO), Take by mouth 2 (two) times daily., Disp: , Rfl:  .  nitroGLYCERIN (NITROSTAT) 0.4 MG SL tablet, Place 0.4 mg under the tongue every 5 (five) minutes as needed for chest pain., Disp: , Rfl:  .  pantoprazole (PROTONIX) 40 MG tablet, Take 1 tablet (40 mg total) by mouth daily., Disp: 90 tablet, Rfl: 0 .  polyethylene glycol (MIRALAX / GLYCOLAX) packet, Take 17 g by mouth daily., Disp: , Rfl:  .  pravastatin (PRAVACHOL) 40 MG tablet, Take 1 tablet (40 mg total) by mouth daily., Disp: 90 tablet, Rfl: 0 .  sertraline (ZOLOFT) 100 MG tablet, Take 1.5 tablets (150 mg total) by mouth daily., Disp: 45 tablet, Rfl: 2  Allergies  Allergen Reactions  . Sulfamethoxazole-Trimethoprim Nausea Only  . Baclofen      Review of Systems  Constitutional: Negative for chills.  Respiratory: Negative for shortness of breath.   Gastrointestinal: Negative for bowel incontinence.  Genitourinary: Positive for incomplete emptying and nocturia. Negative for bladder incontinence and urgency.  Musculoskeletal: Positive for back pain and joint pain. Negative for myalgias.  Skin: Negative for itching.  Neurological: Negative for weakness and numbness.  Psychiatric/Behavioral: Positive for depression. The patient is nervous/anxious.     Objective  Filed Vitals:   12/24/14 1336  BP: 110/52  Pulse: 68  Temp: 98 F (36.7 C)  TempSrc: Oral  Resp: 12  Weight: 199 lb 11.2 oz (90.583 kg)  SpO2: 96%    Physical Exam  Constitutional: He is oriented to person, place, and time and well-developed, well-nourished, and in no distress.  Cardiovascular: Normal rate, regular rhythm and normal heart sounds.   Pulmonary/Chest: Effort normal and breath sounds normal. He has no wheezes.  Musculoskeletal:        Lumbar back: He exhibits tenderness and pain.       Back:  Neurological: He is alert and oriented to person, place, and time.  Skin: Lesion and rash noted. Rash is macular.  Macular, nontender, healing lesion on the right side of his face.  Nursing note and vitals reviewed.   Assessment & Plan  1. Chronic LBP Chronic lumbar findings degenerative disc disease and spinal stenosis. Pain relatively controlled on hydrocodone 5-3 25 mg every 6 hours as needed. He shouldn't wishes to control his lower back pain without having to take medication. We will refer to pain management for any alternative options. We have again  reinforced that he should avoid taking hydrocodone and clonazepam together, as a combination may impair his respiratory drive. Patient is aware of the dependence potential, and drug interactions of opioids and benzodiazepines. He acknowledges understanding. - HYDROcodone-acetaminophen (NORCO/VICODIN) 5-325 MG tablet; Take 1 tablet by mouth every 6 (six) hours as needed for moderate pain or severe pain.  Dispense: 120 tablet; Refill: 0 - Ambulatory referral to Pain Clinic  2. Hypercholesteremia  - pravastatin (PRAVACHOL) 40 MG tablet; Take 1 tablet (40 mg total) by mouth daily.  Dispense: 90 tablet; Refill: 0  3. BPH without obstruction/lower urinary tract symptoms Schendt appears to have no urinary symptoms but refusing a digital rectal exam for evaluation of prostate. He states that 'I am 13 and I have a large prostate like most men my age.' Refill doxazosin and follow-up. - doxazosin (CARDURA) 1 MG tablet; Take 1 tablet (1 mg total) by mouth daily.  Dispense: 30 tablet; Refill: 3   4. Skin lesion of face Skin lesion appears most consistent with a prescription of Prempro. No bleeding. Patient reassured. RTC if lesion does not resolve within 2 weeks for further evaluation.  Kyiah Canepa [facility] 12/24/2014 1:55 PM

## 2015-01-15 ENCOUNTER — Telehealth: Payer: Self-pay

## 2015-01-15 DIAGNOSIS — K219 Gastro-esophageal reflux disease without esophagitis: Secondary | ICD-10-CM

## 2015-01-15 MED ORDER — PANTOPRAZOLE SODIUM 40 MG PO TBEC: 40.0000 mg | DELAYED_RELEASE_TABLET | Freq: Every day | ORAL | Status: DC

## 2015-01-15 MED ORDER — LISINOPRIL 2.5 MG PO TABS: 2.5000 mg | ORAL_TABLET | Freq: Every day | ORAL | Status: DC

## 2015-01-15 NOTE — Telephone Encounter (Signed)
Medication has been refilled and sent to [facility] [address] 

## 2015-01-22 ENCOUNTER — Telehealth: Payer: Self-pay

## 2015-01-22 ENCOUNTER — Ambulatory Visit: Payer: Medicare Other | Admitting: Family Medicine

## 2015-01-22 DIAGNOSIS — M545 Low back pain, unspecified: Secondary | ICD-10-CM

## 2015-01-22 DIAGNOSIS — F411 Generalized anxiety disorder: Secondary | ICD-10-CM

## 2015-01-22 DIAGNOSIS — G8929 Other chronic pain: Secondary | ICD-10-CM

## 2015-01-22 DIAGNOSIS — I2510 Atherosclerotic heart disease of native coronary artery without angina pectoris: Secondary | ICD-10-CM

## 2015-01-22 MED ORDER — HYDROCODONE-ACETAMINOPHEN 5-325 MG PO TABS: 1.0000 | ORAL_TABLET | Freq: Four times a day (QID) | ORAL | Status: DC | PRN

## 2015-01-22 MED ORDER — METOPROLOL TARTRATE 25 MG PO TABS: 25.0000 mg | ORAL_TABLET | Freq: Two times a day (BID) | ORAL | Status: DC

## 2015-01-22 MED ORDER — CLONAZEPAM 0.5 MG PO TABS: 0.5000 mg | ORAL_TABLET | Freq: Two times a day (BID) | ORAL | Status: DC | PRN

## 2015-01-22 NOTE — Progress Notes (Signed)
This encounter was created in error - please disregard.

## 2015-01-22 NOTE — Telephone Encounter (Signed)
Medication has been refilled and sent to [facility] [address] 

## 2015-01-25 ENCOUNTER — Telehealth: Payer: Self-pay | Admitting: Family Medicine

## 2015-01-25 NOTE — Telephone Encounter (Signed)
Adela Lank (daughter) came by and stated that her dad had appointment the other day but was not seen. His wife handed you three medications that he was needing. They went to the pharmacy and only one prescription was there. I looked in the chart and it seems that the prescriptions where done but Adela Lank stated that her mom is not able to find them nor does she remember her parents having the prescriptions in their hand when they left.

## 2015-01-27 NOTE — Telephone Encounter (Signed)
Please schedule patient for an appointment to discuss medication refills. Please also advise patient to bring all his current and active medications with him.

## 2015-01-28 NOTE — Telephone Encounter (Signed)
Patient received a message on Friday night that he had two prescriptions to pick up that is not able to be called in. Stated that when his wife come to pick them up they will then schedule the appointment.

## 2015-02-13 ENCOUNTER — Encounter: Payer: Self-pay | Admitting: Family Medicine

## 2015-02-13 ENCOUNTER — Ambulatory Visit (INDEPENDENT_AMBULATORY_CARE_PROVIDER_SITE_OTHER): Payer: Medicare Other | Admitting: Family Medicine

## 2015-02-13 VITALS — BP 110/68 | HR 80 | Temp 98.2°F | Resp 16 | Ht 70.0 in | Wt 199.1 lb

## 2015-02-13 DIAGNOSIS — I6529 Occlusion and stenosis of unspecified carotid artery: Secondary | ICD-10-CM | POA: Diagnosis not present

## 2015-02-13 DIAGNOSIS — F331 Major depressive disorder, recurrent, moderate: Secondary | ICD-10-CM

## 2015-02-13 DIAGNOSIS — F411 Generalized anxiety disorder: Secondary | ICD-10-CM | POA: Diagnosis not present

## 2015-02-13 MED ORDER — SERTRALINE HCL 100 MG PO TABS
150.0000 mg | ORAL_TABLET | Freq: Every day | ORAL | Status: DC
Start: 2015-02-13 — End: 2015-04-18

## 2015-02-13 MED ORDER — CLONAZEPAM 0.5 MG PO TABS: 0.5000 mg | ORAL_TABLET | Freq: Three times a day (TID) | ORAL | Status: DC | PRN

## 2015-02-13 NOTE — Progress Notes (Signed)
Name: [name]   MRN: [id number]    DOB: April 14, 1923   Date:02/13/2015       Progress Note  Subjective  Chief Complaint  Chief Complaint  Patient presents with  . Follow-up    Medication  . Hyperlipidemia  . Hypertension    Depression        This is a chronic problem.The problem is unchanged.  Associated symptoms include helplessness, hopelessness, myalgias and sad.  Past treatments include SSRIs - Selective serotonin reuptake inhibitors.  Compliance with treatment is good.  Previous treatment provided moderate (Pt. still has residual symptoms.) relief.  Past medical history includes chronic pain and anxiety (requesting increase in Clonazepam to 3 times daily as needed on days he feels particularly anxious).     Past Medical History  Diagnosis Date  . Carotid stenosis   . 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 Hx of [name]   . Coronary artery disease   . Dyslipidemia   . Hypertension   . Chronic back pain   . Anxiety   . GERD (gastroesophageal reflux disease)   . Multiple myeloma ([place])   . BPH (benign prostatic hypertrophy)   . Pleural effusion     left  . Inguinal hernia   . Sleep apnea   . Major depression ([place])   . Hyperlipidemia     Past Surgical History  Procedure Laterality Date  . Colonoscopy    . Appendectomy    . Hip fracture surgery    . Cardiac catheterization  2002  . Cardiac catheterization  06/2013    Family History  Problem Relation Age of Onset  . Heart disease Mother   . Stroke Mother   . Diabetes Father   . Dementia Father   . Stroke Sister   . Stroke Brother   . Stroke Sister   . Stroke Sister   . Stroke Brother   . Stroke Brother     Social History   Social History  . Marital Status: Married    Spouse Name: N/A  . Number of Children: N/A  . Years of Education: N/A   Occupational History  . Not on file.   Social History Main Topics  . Smoking status: Former Smoker -- 2.00 packs/day for 7 years    Types: Cigarettes    Quit  date: 09/17/1953  . Smokeless tobacco: Never Used  . Alcohol Use: No  . Drug Use: No  . Sexual Activity: Not Currently   Other Topics Concern  . Not on file   Social History Narrative     Current outpatient prescriptions:  .  acetaminophen (TYLENOL) 500 MG tablet, Take 500 mg by mouth 2 (two) times daily., Disp: , Rfl:  .  aspirin EC 81 MG tablet, Take 1 tablet (81 mg total) by mouth daily., Disp: 90 tablet, Rfl: 3 .  Cholecalciferol (D 5000) 5000 UNITS TABS, Take by mouth., Disp: , Rfl:  .  clindamycin (CLEOCIN) 300 MG capsule, Take 1 capsule (300 mg total) by mouth 4 (four) times daily., Disp: 28 capsule, Rfl: 0 .  clonazePAM (KLONOPIN) 0.5 MG tablet, Take 1 tablet (0.5 mg total) by mouth 2 (two) times daily as needed for anxiety., Disp: 60 tablet, Rfl: 2 .  doxazosin (CARDURA) 1 MG tablet, Take 1 tablet (1 mg total) by mouth daily., Disp: 30 tablet, Rfl: 3 .  HYDROcodone-acetaminophen (NORCO/VICODIN) 5-325 MG tablet, Take 1 tablet by mouth every 6 (six) hours as needed for moderate pain or severe pain.,  Disp: 120 tablet, Rfl: 0 .  isosorbide mononitrate (IMDUR) 30 MG 24 hr tablet, Take 1 tablet (30 mg total) by mouth daily., Disp: 90 tablet, Rfl: 1 .  lisinopril (PRINIVIL,ZESTRIL) 2.5 MG tablet, Take 1 tablet (2.5 mg total) by mouth daily., Disp: 30 tablet, Rfl: 1 .  metoprolol tartrate (LOPRESSOR) 25 MG tablet, Take 1 tablet (25 mg total) by mouth 2 (two) times daily., Disp: 180 tablet, Rfl: 1 .  Multiple Vitamins-Minerals (PRESERVISION/LUTEIN PO), Take by mouth 2 (two) times daily., Disp: , Rfl:  .  nitroGLYCERIN (NITROSTAT) 0.4 MG SL tablet, Place 0.4 mg under the tongue every 5 (five) minutes as needed for chest pain., Disp: , Rfl:  .  pantoprazole (PROTONIX) 40 MG tablet, Take 1 tablet (40 mg total) by mouth daily., Disp: 90 tablet, Rfl: 0 .  polyethylene glycol (MIRALAX / GLYCOLAX) packet, Take 17 g by mouth daily., Disp: , Rfl:  .  pravastatin (PRAVACHOL) 40 MG tablet, Take 1  tablet (40 mg total) by mouth daily., Disp: 90 tablet, Rfl: 0 .  sertraline (ZOLOFT) 100 MG tablet, Take 1.5 tablets (150 mg total) by mouth daily., Disp: 45 tablet, Rfl: 2  Allergies  Allergen Reactions  . Sulfamethoxazole-Trimethoprim Nausea Only and Other (See Comments)  . Baclofen      Review of Systems  Musculoskeletal: Positive for myalgias.  Psychiatric/Behavioral: Positive for depression.     Objective  Filed Vitals:   02/13/15 1340  BP: 110/68  Pulse: 80  Temp: 98.2 F (36.8 C)  TempSrc: Oral  Resp: 16  Height: '5\' 10"'$  (1.778 m)  Weight: 199 lb 1.6 oz (90.311 kg)  SpO2: 94%    Physical Exam  Constitutional: He is oriented to person, place, and time and well-developed, well-nourished, and in no distress.  Cardiovascular: Normal rate and regular rhythm.   Pulmonary/Chest: Effort normal and breath sounds normal.  Neurological: He is alert and oriented to person, place, and time.  Nursing note and vitals reviewed.    Assessment & Plan  1. Major depressive disorder, recurrent episode, moderate (HCC) Symptoms of depression are reasonably well controlled on SSRI therapy. - sertraline (ZOLOFT) 100 MG tablet; Take 1.5 tablets (150 mg total) by mouth daily.  Dispense: 45 tablet; Refill: 2  2. Generalized anxiety disorder We will increase clonazepam from twice daily to 3 times daily as needed  4 days that he feels more anxious than usual. Compliant with controlled substances agreement. Follow-up in one month. - clonazePAM (KLONOPIN) 0.5 MG tablet; Take 1 tablet (0.5 mg total) by mouth 3 (three) times daily as needed for anxiety.  Dispense: 90 tablet; Refill: 0   [name] [facility] 02/13/2015 1:46 PM

## 2015-02-28 ENCOUNTER — Ambulatory Visit (INDEPENDENT_AMBULATORY_CARE_PROVIDER_SITE_OTHER): Payer: Medicare Other | Admitting: Family Medicine

## 2015-02-28 ENCOUNTER — Encounter: Payer: Self-pay | Admitting: Family Medicine

## 2015-02-28 VITALS — BP 124/60 | HR 88 | Temp 98.6°F | Resp 16 | Ht 71.0 in | Wt 200.0 lb

## 2015-02-28 DIAGNOSIS — M545 Low back pain, unspecified: Secondary | ICD-10-CM

## 2015-02-28 DIAGNOSIS — G8929 Other chronic pain: Secondary | ICD-10-CM | POA: Diagnosis not present

## 2015-02-28 MED ORDER — HYDROCODONE-ACETAMINOPHEN 5-325 MG PO TABS: 1.0000 | ORAL_TABLET | Freq: Three times a day (TID) | ORAL | Status: DC | PRN

## 2015-02-28 NOTE — Progress Notes (Signed)
Name: [name]   MRN: [id number]    DOB: 1923-08-21   Date:02/28/2015       Progress Note  Subjective  Chief Complaint  Chief Complaint  Patient presents with  . Medication Refill    follow-up  . Depression  . Hypertension  . Hyperlipidemia  . Gastroesophageal Reflux  . Pain    Back Pain This is a chronic problem. The problem occurs daily. The problem is unchanged. The pain is present in the lumbar spine. The pain does not radiate. The pain is at a severity of 8/10. The symptoms are aggravated by bending. Pertinent negatives include no bladder incontinence, bowel incontinence, chest pain or leg pain. He has tried analgesics for the symptoms.   patient takes hydrocodone 3 times daily as needed instead of every 6 hours.  Past Medical History  Diagnosis Date  . Carotid stenosis   . 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 Hx of [name]   . Coronary artery disease   . Dyslipidemia   . Hypertension   . Chronic back pain   . Anxiety   . GERD (gastroesophageal reflux disease)   . Multiple myeloma ([place])   . BPH (benign prostatic hypertrophy)   . Pleural effusion     left  . Inguinal hernia   . Sleep apnea   . Major depression ([place])   . Hyperlipidemia     Past Surgical History  Procedure Laterality Date  . Colonoscopy    . Appendectomy    . Hip fracture surgery    . Cardiac catheterization  2002  . Cardiac catheterization  06/2013    Family History  Problem Relation Age of Onset  . Heart disease Mother   . Stroke Mother   . Diabetes Father   . Dementia Father   . Stroke Sister   . Stroke Brother   . Stroke Sister   . Stroke Sister   . Stroke Brother   . Stroke Brother     Social History   Social History  . Marital Status: Married    Spouse Name: N/A  . Number of Children: N/A  . Years of Education: N/A   Occupational History  . Not on file.   Social History Main Topics  . Smoking status: Former Smoker -- 2.00 packs/day for 7 years    Types:  Cigarettes    Quit date: 09/17/1953  . Smokeless tobacco: Never Used  . Alcohol Use: No  . Drug Use: No  . Sexual Activity: Not Currently   Other Topics Concern  . Not on file   Social History Narrative     Current outpatient prescriptions:  .  acetaminophen (TYLENOL) 500 MG tablet, Take 500 mg by mouth 2 (two) times daily., Disp: , Rfl:  .  aspirin EC 81 MG tablet, Take 1 tablet (81 mg total) by mouth daily., Disp: 90 tablet, Rfl: 3 .  Cholecalciferol (D 5000) 5000 UNITS TABS, Take by mouth., Disp: , Rfl:  .  clindamycin (CLEOCIN) 300 MG capsule, Take 1 capsule (300 mg total) by mouth 4 (four) times daily., Disp: 28 capsule, Rfl: 0 .  clonazePAM (KLONOPIN) 0.5 MG tablet, Take 1 tablet (0.5 mg total) by mouth 3 (three) times daily as needed for anxiety., Disp: 90 tablet, Rfl: 0 .  doxazosin (CARDURA) 1 MG tablet, Take 1 tablet (1 mg total) by mouth daily., Disp: 30 tablet, Rfl: 3 .  HYDROcodone-acetaminophen (NORCO/VICODIN) 5-325 MG tablet, Take 1 tablet by mouth every 6 (six) hours  as needed for moderate pain or severe pain., Disp: 120 tablet, Rfl: 0 .  isosorbide mononitrate (IMDUR) 30 MG 24 hr tablet, Take 1 tablet (30 mg total) by mouth daily., Disp: 90 tablet, Rfl: 1 .  lisinopril (PRINIVIL,ZESTRIL) 2.5 MG tablet, Take 1 tablet (2.5 mg total) by mouth daily., Disp: 30 tablet, Rfl: 1 .  metoprolol tartrate (LOPRESSOR) 25 MG tablet, Take 1 tablet (25 mg total) by mouth 2 (two) times daily., Disp: 180 tablet, Rfl: 1 .  Multiple Vitamins-Minerals (PRESERVISION/LUTEIN PO), Take by mouth 2 (two) times daily., Disp: , Rfl:  .  nitroGLYCERIN (NITROSTAT) 0.4 MG SL tablet, Place 0.4 mg under the tongue every 5 (five) minutes as needed for chest pain., Disp: , Rfl:  .  pantoprazole (PROTONIX) 40 MG tablet, Take 1 tablet (40 mg total) by mouth daily., Disp: 90 tablet, Rfl: 0 .  polyethylene glycol (MIRALAX / GLYCOLAX) packet, Take 17 g by mouth daily., Disp: , Rfl:  .  pravastatin (PRAVACHOL)  40 MG tablet, Take 1 tablet (40 mg total) by mouth daily., Disp: 90 tablet, Rfl: 0 .  sertraline (ZOLOFT) 100 MG tablet, Take 1.5 tablets (150 mg total) by mouth daily., Disp: 45 tablet, Rfl: 2  Allergies  Allergen Reactions  . Sulfamethoxazole-Trimethoprim Nausea Only and Other (See Comments)  . Baclofen      Review of Systems  Cardiovascular: Negative for chest pain.  Gastrointestinal: Negative for bowel incontinence.  Genitourinary: Negative for bladder incontinence.  Musculoskeletal: Positive for back pain.    Objective  Filed Vitals:   02/28/15 1604  BP: 124/60  Pulse: 88  Temp: 98.6 F (37 C)  TempSrc: Oral  Resp: 16  Height: _0  (1.803 m)  Weight: 200 lb (90.719 kg)  SpO2: 91%    Physical Exam  Constitutional: He is oriented to person, place, and time and well-developed, well-nourished, and in no distress.  Cardiovascular: Normal rate and regular rhythm.   Pulmonary/Chest: Effort normal and breath sounds normal.  Musculoskeletal:       Lumbar back: He exhibits no tenderness, no pain and no spasm.  Neurological: He is alert and oriented to person, place, and time.  Psychiatric: Mood, memory, affect and judgment normal.  Nursing note and vitals reviewed.   Assessment & Plan  1. Chronic LBP Patient and spouse educated on dependence potential and drug interactions between opioids and benzos. Advised to separate the time of administration of hydrocodone and clonazepam. Refills provided. Follow-up in 3 months.  - HYDROcodone-acetaminophen (NORCO/VICODIN) 5-325 MG tablet; Take 1 tablet by mouth 3 (three) times daily as needed for moderate pain or severe pain.  Dispense: 90 tablet; Refill: 0   Lynnelle Mesmer [facility] 02/28/2015 4:19 PM

## 2015-03-05 ENCOUNTER — Telehealth: Payer: Self-pay

## 2015-03-05 DIAGNOSIS — R05 Cough: Secondary | ICD-10-CM

## 2015-03-05 DIAGNOSIS — R058 Other specified cough: Secondary | ICD-10-CM

## 2015-03-05 MED ORDER — BENZONATATE 100 MG PO CAPS: 100.0000 mg | ORAL_CAPSULE | Freq: Three times a day (TID) | ORAL | Status: DC | PRN

## 2015-03-05 NOTE — Telephone Encounter (Signed)
Routed to Dr. Manuella Ghazi for advice concerning medication

## 2015-03-05 NOTE — Telephone Encounter (Signed)
Spoke with patient's wife Kalman Louallen, who reports that patient has increased coughing, with clear and yellowish mucus. Present for 5 days, does have some sinus congestion. No shortness of breath, fevers, chills, or sore throat. She was not sure which medication to get over-the-counter for patient's cough. We will recommend starting on Tessalon 100 mg 3 times daily. We will send Rx to patient's pharmacy. Advised to follow-up if symptoms do not improve within the next 3-4 days. Verbalized agreement

## 2015-03-15 ENCOUNTER — Ambulatory Visit: Payer: Medicare Other | Admitting: Family Medicine

## 2015-03-20 ENCOUNTER — Other Ambulatory Visit: Payer: Self-pay | Admitting: Family Medicine

## 2015-03-20 DIAGNOSIS — F411 Generalized anxiety disorder: Secondary | ICD-10-CM

## 2015-03-20 NOTE — Telephone Encounter (Signed)
Routed to Dr. Shah for approval 

## 2015-03-20 NOTE — Telephone Encounter (Signed)
CLONAZEPAM IS NEEDING TO BE REFILLED. NEEDS 90 DAY SUPPLY PHARM IS [facility]

## 2015-04-01 ENCOUNTER — Encounter: Payer: Self-pay | Admitting: Family Medicine

## 2015-04-01 ENCOUNTER — Ambulatory Visit (INDEPENDENT_AMBULATORY_CARE_PROVIDER_SITE_OTHER): Payer: Medicare Other | Admitting: Family Medicine

## 2015-04-01 ENCOUNTER — Other Ambulatory Visit: Payer: Self-pay

## 2015-04-01 VITALS — BP 122/75 | HR 81 | Temp 98.0°F | Resp 20 | Ht 71.0 in | Wt 197.9 lb

## 2015-04-01 DIAGNOSIS — G8929 Other chronic pain: Secondary | ICD-10-CM | POA: Diagnosis not present

## 2015-04-01 DIAGNOSIS — Z029 Encounter for administrative examinations, unspecified: Secondary | ICD-10-CM | POA: Diagnosis not present

## 2015-04-01 DIAGNOSIS — F411 Generalized anxiety disorder: Secondary | ICD-10-CM | POA: Diagnosis not present

## 2015-04-01 DIAGNOSIS — M545 Low back pain, unspecified: Secondary | ICD-10-CM

## 2015-04-01 DIAGNOSIS — E7800 Pure hypercholesterolemia, unspecified: Secondary | ICD-10-CM

## 2015-04-01 MED ORDER — HYDROCODONE-ACETAMINOPHEN 5-325 MG PO TABS: 1.0000 | ORAL_TABLET | Freq: Three times a day (TID) | ORAL | Status: DC | PRN

## 2015-04-01 MED ORDER — LISINOPRIL 2.5 MG PO TABS: 2.5000 mg | ORAL_TABLET | Freq: Every day | ORAL | Status: DC

## 2015-04-01 MED ORDER — PRAVASTATIN SODIUM 40 MG PO TABS: 40.0000 mg | ORAL_TABLET | Freq: Every day | ORAL | Status: DC

## 2015-04-01 MED ORDER — CLONAZEPAM 0.5 MG PO TABS
0.5000 mg | ORAL_TABLET | Freq: Three times a day (TID) | ORAL | Status: DC | PRN
Start: 2015-04-01 — End: 2015-04-29

## 2015-04-01 NOTE — Telephone Encounter (Signed)
Refills provided today during office visit.

## 2015-04-01 NOTE — Progress Notes (Signed)
Name: [name]   MRN: [id number]    DOB: 01/21/1924   Date:04/01/2015       Progress Note  Subjective  Chief Complaint  Chief Complaint  Patient presents with  . Follow-up    Paperwork    Back Pain This is a chronic problem. The problem has been gradually improving (receiving injections to his lower back from Pain Management.) since onset. The pain is present in the lumbar spine. Quality: sharp pain. The pain is at a severity of 6/10. The pain is moderate. The symptoms are aggravated by position, sitting and standing. Pertinent negatives include no bladder incontinence, bowel incontinence, chest pain, leg pain, numbness or weakness. He has tried analgesics (Receiving steroid injections in his lower back) for the symptoms.  Anxiety Presents for follow-up visit. The problem has been unchanged. Symptoms include depressed mood, excessive worry, insomnia and nervous/anxious behavior. Patient reports no chest pain. The severity of symptoms is moderate and causing significant distress.   His past medical history is significant for anxiety/panic attacks and depression. Past treatments include benzodiazephines. The treatment provided moderate relief. Compliance with prior treatments has been good.    Pt. is here to have a form filled out for obtaining VA benefits pertaining to an aide for assistance at home. He lives in a retirement community called [facility], along with his wife. Form pertains to pt.'s homebound status, level of activity, assistance with ADLs and overall physical health.  Past Medical History  Diagnosis Date  . Carotid stenosis   . 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 Hx of [name]   . Coronary artery disease   . Dyslipidemia   . Hypertension   . Chronic back pain   . Anxiety   . GERD (gastroesophageal reflux disease)   . Multiple myeloma ([place])   . BPH (benign prostatic hypertrophy)   . Pleural effusion     left  . Inguinal hernia   . Sleep apnea   . Major  depression ([place])   . Hyperlipidemia     Past Surgical History  Procedure Laterality Date  . Colonoscopy    . Appendectomy    . Hip fracture surgery    . Cardiac catheterization  2002  . Cardiac catheterization  06/2013    Family History  Problem Relation Age of Onset  . Heart disease Mother   . Stroke Mother   . Diabetes Father   . Dementia Father   . Stroke Sister   . Stroke Brother   . Stroke Sister   . Stroke Sister   . Stroke Brother   . Stroke Brother     Social History   Social History  . Marital Status: Married    Spouse Name: N/A  . Number of Children: N/A  . Years of Education: N/A   Occupational History  . Not on file.   Social History Main Topics  . Smoking status: Former Smoker -- 2.00 packs/day for 7 years    Types: Cigarettes    Quit date: 09/17/1953  . Smokeless tobacco: Never Used  . Alcohol Use: No  . Drug Use: No  . Sexual Activity: Not Currently   Other Topics Concern  . Not on file   Social History Narrative     Current outpatient prescriptions:  .  acetaminophen (TYLENOL) 500 MG tablet, Take 500 mg by mouth 2 (two) times daily., Disp: , Rfl:  .  aspirin EC 81 MG tablet, Take 1 tablet (81 mg total) by mouth daily.,  Disp: 90 tablet, Rfl: 3 .  benzonatate (TESSALON) 100 MG capsule, Take 1 capsule (100 mg total) by mouth 3 (three) times daily as needed for cough., Disp: 21 capsule, Rfl: 0 .  Cholecalciferol (D 5000) 5000 UNITS TABS, Take by mouth., Disp: , Rfl:  .  clindamycin (CLEOCIN) 300 MG capsule, Take 1 capsule (300 mg total) by mouth 4 (four) times daily., Disp: 28 capsule, Rfl: 0 .  clonazePAM (KLONOPIN) 0.5 MG tablet, Take 1 tablet (0.5 mg total) by mouth 3 (three) times daily as needed for anxiety., Disp: 90 tablet, Rfl: 0 .  doxazosin (CARDURA) 1 MG tablet, Take 1 tablet (1 mg total) by mouth daily., Disp: 30 tablet, Rfl: 3 .  HYDROcodone-acetaminophen (NORCO/VICODIN) 5-325 MG tablet, Take 1 tablet by mouth 3 (three) times daily  as needed for moderate pain or severe pain., Disp: 90 tablet, Rfl: 0 .  isosorbide mononitrate (IMDUR) 30 MG 24 hr tablet, Take 1 tablet (30 mg total) by mouth daily., Disp: 90 tablet, Rfl: 1 .  lisinopril (PRINIVIL,ZESTRIL) 2.5 MG tablet, Take 1 tablet (2.5 mg total) by mouth daily., Disp: 90 tablet, Rfl: 0 .  metoprolol tartrate (LOPRESSOR) 25 MG tablet, Take 1 tablet (25 mg total) by mouth 2 (two) times daily., Disp: 180 tablet, Rfl: 1 .  Multiple Vitamins-Minerals (PRESERVISION/LUTEIN PO), Take by mouth 2 (two) times daily., Disp: , Rfl:  .  nitroGLYCERIN (NITROSTAT) 0.4 MG SL tablet, Place 0.4 mg under the tongue every 5 (five) minutes as needed for chest pain., Disp: , Rfl:  .  pantoprazole (PROTONIX) 40 MG tablet, Take 1 tablet (40 mg total) by mouth daily., Disp: 90 tablet, Rfl: 0 .  polyethylene glycol (MIRALAX / GLYCOLAX) packet, Take 17 g by mouth daily., Disp: , Rfl:  .  pravastatin (PRAVACHOL) 40 MG tablet, Take 1 tablet (40 mg total) by mouth daily., Disp: 90 tablet, Rfl: 0 .  sertraline (ZOLOFT) 100 MG tablet, Take 1.5 tablets (150 mg total) by mouth daily., Disp: 45 tablet, Rfl: 2  Allergies  Allergen Reactions  . Sulfamethoxazole-Trimethoprim Nausea Only and Other (See Comments)  . Baclofen      Review of Systems  Cardiovascular: Negative for chest pain.  Gastrointestinal: Negative for bowel incontinence.  Genitourinary: Negative for bladder incontinence.  Musculoskeletal: Positive for back pain.  Neurological: Negative for weakness and numbness.  Psychiatric/Behavioral: The patient is nervous/anxious and has insomnia.     Objective  Filed Vitals:   04/01/15 1450  BP: 122/75  Pulse: 81  Temp: 98 F (36.7 C)  TempSrc: Oral  Resp: 20  Height: 5' 11" (1.803 m)  Weight: 197 lb 14.4 oz (89.767 kg)  SpO2: 94%    Physical Exam  Constitutional: He is well-developed, well-nourished, and in no distress.  Cardiovascular: Normal rate and regular rhythm.     Pulmonary/Chest: Effort normal. He has rales.  Musculoskeletal:       Lumbar back: He exhibits tenderness, pain and spasm.       Back:  Neurological: He is alert.  Skin: Skin is warm and dry.  Psychiatric: Mood, affect and judgment normal.  Nursing note and vitals reviewed.   Assessment & Plan  1. Chronic LBP  - HYDROcodone-acetaminophen (NORCO/VICODIN) 5-325 MG tablet; Take 1 tablet by mouth 3 (three) times daily as needed for moderate pain or severe pain.  Dispense: 90 tablet; Refill: 0  2. Generalized anxiety disorder  - clonazePAM (KLONOPIN) 0.5 MG tablet; Take 1 tablet (0.5 mg total) by mouth 3 (three) times  daily as needed for anxiety.  Dispense: 90 tablet; Refill: 0  3. Encounters for administrative purpose Filled out the form with Vision screening still remaining. Pt.'s son will drop off his eye prescription and will out the form using information from the prescription.   Syed Asad A[facility] 04/01/2015 3:03 PM

## 2015-04-01 NOTE — Telephone Encounter (Signed)
Lisinopril 2.5 mg and pravastatin 40 mg has been refilled and sent to [facility]. The Clonazepam and the Hydrocodone has been routed to Dr. Manuella Ghazi for approval

## 2015-04-10 ENCOUNTER — Telehealth: Payer: Self-pay | Admitting: Family Medicine

## 2015-04-10 DIAGNOSIS — K219 Gastro-esophageal reflux disease without esophagitis: Secondary | ICD-10-CM

## 2015-04-10 MED ORDER — PANTOPRAZOLE SODIUM 40 MG PO TBEC: 40.0000 mg | DELAYED_RELEASE_TABLET | Freq: Every day | ORAL | Status: DC

## 2015-04-10 NOTE — Telephone Encounter (Signed)
Medication has been refilled and sent to [facility] [address] 

## 2015-04-10 NOTE — Telephone Encounter (Signed)
Requesting a refill on Pantoprazole. Please send to [facility]. He have enough to last through the remainder of this week

## 2015-04-18 ENCOUNTER — Other Ambulatory Visit: Payer: Self-pay

## 2015-04-18 DIAGNOSIS — F331 Major depressive disorder, recurrent, moderate: Secondary | ICD-10-CM

## 2015-04-18 DIAGNOSIS — N400 Enlarged prostate without lower urinary tract symptoms: Secondary | ICD-10-CM

## 2015-04-18 NOTE — Telephone Encounter (Signed)
Routed to Dr. Shah for approval 

## 2015-04-19 MED ORDER — DOXAZOSIN MESYLATE 1 MG PO TABS: 1.0000 mg | ORAL_TABLET | Freq: Every day | ORAL | Status: DC

## 2015-04-19 MED ORDER — SERTRALINE HCL 100 MG PO TABS: 150.0000 mg | ORAL_TABLET | Freq: Every day | ORAL | Status: DC

## 2015-04-23 ENCOUNTER — Encounter: Payer: Self-pay | Admitting: Family Medicine

## 2015-04-29 ENCOUNTER — Encounter: Payer: Self-pay | Admitting: Family Medicine

## 2015-04-29 ENCOUNTER — Ambulatory Visit (INDEPENDENT_AMBULATORY_CARE_PROVIDER_SITE_OTHER): Payer: Medicare Other | Admitting: Family Medicine

## 2015-04-29 VITALS — BP 110/68 | HR 75 | Temp 97.9°F | Resp 18 | Ht 71.0 in | Wt 204.2 lb

## 2015-04-29 DIAGNOSIS — G8929 Other chronic pain: Secondary | ICD-10-CM

## 2015-04-29 DIAGNOSIS — I2510 Atherosclerotic heart disease of native coronary artery without angina pectoris: Secondary | ICD-10-CM

## 2015-04-29 DIAGNOSIS — M545 Low back pain, unspecified: Secondary | ICD-10-CM

## 2015-04-29 DIAGNOSIS — F411 Generalized anxiety disorder: Secondary | ICD-10-CM

## 2015-04-29 MED ORDER — ISOSORBIDE MONONITRATE ER 30 MG PO TB24: 30.0000 mg | ORAL_TABLET | Freq: Every day | ORAL | Status: DC

## 2015-04-29 MED ORDER — CLONAZEPAM 0.5 MG PO TABS: 0.5000 mg | ORAL_TABLET | Freq: Three times a day (TID) | ORAL | Status: DC | PRN

## 2015-04-29 MED ORDER — HYDROCODONE-ACETAMINOPHEN 5-325 MG PO TABS: 1.0000 | ORAL_TABLET | Freq: Three times a day (TID) | ORAL | Status: DC | PRN

## 2015-04-29 NOTE — Progress Notes (Signed)
Name: [name]   MRN: [id number]    DOB: 07-10-1923   Date:04/29/2015       Progress Note  Subjective  Chief Complaint  Chief Complaint  Patient presents with  . chronic LBP    pt here for 1 month follow up    Back Pain This is a chronic problem. The problem is unchanged. The pain is present in the lumbar spine. Quality: sharp pain. The pain is at a severity of 5/10. The pain is moderate. The symptoms are aggravated by position, sitting and standing. Pertinent negatives include no bladder incontinence, bowel incontinence, chest pain or leg pain. He has tried analgesics and heat (Has received Steroid Injection in the back, that helped for 2-3 days but not more than that. ) for the symptoms. The treatment provided moderate relief.  Anxiety Presents for follow-up visit. The problem has been unchanged. Symptoms include depressed mood, excessive worry, insomnia and nervous/anxious behavior. Patient reports no chest pain. The severity of symptoms is moderate and causing significant distress.   His past medical history is significant for anxiety/panic attacks and depression. Past treatments include benzodiazephines. The treatment provided moderate relief. Compliance with prior treatments has been good.   Coronary Artery Disease Patient has history of coronary artery disease, being followed by cardiology. No symptoms of angina. Requesting refill for isosorbide mononitrate 30 mg daily.    Past Medical History  Diagnosis Date  . Carotid stenosis   . 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 Hx of [name]   . Coronary artery disease   . Dyslipidemia   . Hypertension   . Chronic back pain   . Anxiety   . GERD (gastroesophageal reflux disease)   . Multiple myeloma ([name])   . BPH (benign prostatic hypertrophy)   . Pleural effusion     left  . Inguinal hernia   . Sleep apnea   . Major depression ([name])   . Hyperlipidemia     Past Surgical History  Procedure Laterality Date  . Colonoscopy     . Appendectomy    . Hip fracture surgery    . Cardiac catheterization  2002  . Cardiac catheterization  06/2013    Family History  Problem Relation Age of Onset  . Heart disease Mother   . Stroke Mother   . Diabetes Father   . Dementia Father   . Stroke Sister   . Stroke Brother   . Stroke Sister   . Stroke Sister   . Stroke Brother   . Stroke Brother     Social History   Social History  . Marital Status: Married    Spouse Name: N/A  . Number of Children: N/A  . Years of Education: N/A   Occupational History  . Not on file.   Social History Main Topics  . Smoking status: Former Smoker -- 2.00 packs/day for 7 years    Types: Cigarettes    Quit date: 09/17/1953  . Smokeless tobacco: Never Used  . Alcohol Use: No  . Drug Use: No  . Sexual Activity: Not Currently   Other Topics Concern  . Not on file   Social History Narrative     Current outpatient prescriptions:  .  acetaminophen (TYLENOL) 500 MG tablet, Take 500 mg by mouth 2 (two) times daily., Disp: , Rfl:  .  aspirin EC 81 MG tablet, Take 1 tablet (81 mg total) by mouth daily., Disp: 90 tablet, Rfl: 3 .  benzonatate (TESSALON) 100 MG capsule, Take  1 capsule (100 mg total) by mouth 3 (three) times daily as needed for cough., Disp: 21 capsule, Rfl: 0 .  Cholecalciferol (D 5000) 5000 UNITS TABS, Take by mouth., Disp: , Rfl:  .  clindamycin (CLEOCIN) 300 MG capsule, Take 1 capsule (300 mg total) by mouth 4 (four) times daily., Disp: 28 capsule, Rfl: 0 .  clonazePAM (KLONOPIN) 0.5 MG tablet, Take 1 tablet (0.5 mg total) by mouth 3 (three) times daily as needed for anxiety., Disp: 90 tablet, Rfl: 0 .  doxazosin (CARDURA) 1 MG tablet, Take 1 tablet (1 mg total) by mouth daily., Disp: 90 tablet, Rfl: 0 .  HYDROcodone-acetaminophen (NORCO/VICODIN) 5-325 MG tablet, Take 1 tablet by mouth 3 (three) times daily as needed for moderate pain or severe pain., Disp: 90 tablet, Rfl: 0 .  isosorbide mononitrate (IMDUR) 30 MG  24 hr tablet, Take 1 tablet (30 mg total) by mouth daily., Disp: 90 tablet, Rfl: 1 .  lisinopril (PRINIVIL,ZESTRIL) 2.5 MG tablet, Take 1 tablet (2.5 mg total) by mouth daily., Disp: 90 tablet, Rfl: 0 .  metoprolol tartrate (LOPRESSOR) 25 MG tablet, Take 1 tablet (25 mg total) by mouth 2 (two) times daily., Disp: 180 tablet, Rfl: 1 .  Multiple Vitamins-Minerals (PRESERVISION/LUTEIN PO), Take by mouth 2 (two) times daily., Disp: , Rfl:  .  nitroGLYCERIN (NITROSTAT) 0.4 MG SL tablet, Place 0.4 mg under the tongue every 5 (five) minutes as needed for chest pain., Disp: , Rfl:  .  pantoprazole (PROTONIX) 40 MG tablet, Take 1 tablet (40 mg total) by mouth daily., Disp: 90 tablet, Rfl: 0 .  polyethylene glycol (MIRALAX / GLYCOLAX) packet, Take 17 g by mouth daily., Disp: , Rfl:  .  pravastatin (PRAVACHOL) 40 MG tablet, Take 1 tablet (40 mg total) by mouth daily., Disp: 90 tablet, Rfl: 0 .  sertraline (ZOLOFT) 100 MG tablet, Take 1.5 tablets (150 mg total) by mouth daily., Disp: 45 tablet, Rfl: 2  Allergies  Allergen Reactions  . Sulfamethoxazole-Trimethoprim Nausea Only and Other (See Comments)  . Baclofen      Review of Systems  Cardiovascular: Negative for chest pain.  Gastrointestinal: Negative for bowel incontinence.  Genitourinary: Negative for bladder incontinence.  Musculoskeletal: Positive for back pain and joint pain.  Psychiatric/Behavioral: The patient is nervous/anxious and has insomnia.       Objective  Filed Vitals:   04/29/15 1332  BP: 110/68  Pulse: 75  Temp: 97.9 F (36.6 C)  Resp: 18  Height: _0  (1.803 m)  Weight: 204 lb 3 oz (92.619 kg)  SpO2: 95%    Physical Exam  Constitutional: He is oriented to person, place, and time and well-developed, well-nourished, and in no distress.  HENT:  Head: Normocephalic and atraumatic.  Cardiovascular: Normal rate and regular rhythm.   Pulmonary/Chest: Effort normal and breath sounds normal.  Musculoskeletal:        Lumbar back: He exhibits tenderness.       Back:  Neurological: He is alert and oriented to person, place, and time.  Psychiatric: Mood, memory, affect and judgment normal.  Nursing note and vitals reviewed.      Assessment & Plan  1. Coronary artery disease involving native coronary artery of native heart without angina pectoris Refills for isosorbide mononitrate provided. - isosorbide mononitrate (IMDUR) 30 MG 24 hr tablet; Take 1 tablet (30 mg total) by mouth daily.  Dispense: 90 tablet; Refill: 1  2. Chronic LBP Stable and improved after receiving steroid injections. Refills for hydrocodone provided. -  HYDROcodone-acetaminophen (NORCO/VICODIN) 5-325 MG tablet; Take 1 tablet by mouth 3 (three) times daily as needed for moderate pain or severe pain.  Dispense: 90 tablet; Refill: 0  3. Generalized anxiety disorder Stable and responsive to benzodiazepine therapy. Patient compliant with controlled substances agreement. - clonazePAM (KLONOPIN) 0.5 MG tablet; Take 1 tablet (0.5 mg total) by mouth 3 (three) times daily as needed for anxiety.  Dispense: 90 tablet; Refill: 0   Elloise Roark Asad A[facility] 04/29/2015 1:45 PM

## 2015-05-15 ENCOUNTER — Telehealth: Payer: Self-pay | Admitting: Family Medicine

## 2015-05-15 NOTE — Telephone Encounter (Signed)
SERTRALINE IS NEEDING NEW RX FOR THIS MEDICATION.. [place]

## 2015-05-15 NOTE — Telephone Encounter (Signed)
Spoke with patient and notified that there are 2 refills left on this medication and it was refilled on 04/19/2015

## 2015-05-30 ENCOUNTER — Ambulatory Visit (INDEPENDENT_AMBULATORY_CARE_PROVIDER_SITE_OTHER): Payer: Medicare Other | Admitting: Family Medicine

## 2015-05-30 ENCOUNTER — Encounter: Payer: Self-pay | Admitting: Family Medicine

## 2015-05-30 VITALS — BP 108/70 | HR 72 | Temp 98.1°F | Resp 16 | Ht 71.0 in | Wt 208.7 lb

## 2015-05-30 DIAGNOSIS — F411 Generalized anxiety disorder: Secondary | ICD-10-CM

## 2015-05-30 DIAGNOSIS — I2510 Atherosclerotic heart disease of native coronary artery without angina pectoris: Secondary | ICD-10-CM

## 2015-05-30 MED ORDER — CLONAZEPAM 0.5 MG PO TABS: 0.5000 mg | ORAL_TABLET | Freq: Three times a day (TID) | ORAL | Status: DC | PRN

## 2015-05-30 NOTE — Progress Notes (Signed)
Name: [name]   MRN: [id number]    DOB: [birth year]   Date:05/30/2015       Progress Note  Subjective  Chief Complaint  Chief Complaint  Patient presents with  . Follow-up    3 mo  . Medication Refill    clonazepam 0.5 mg     HPI  Anxiety: Pt. Is here for medication refill and follow up on anxiety. Symptoms include being worried that something bad will happen, having recurrent negative ('this wont work out') and 'personal things that bother me'. He elaborates that he is worried about a place to live and finances.  Pt. Takes Clonazepam 0.5 mg three times daily as needed, usually helps relieve his anxiety.  Past Medical History  Diagnosis Date  . Carotid stenosis   . 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 Hx of [name]   . Coronary artery disease   . Dyslipidemia   . Hypertension   . Chronic back pain   . Anxiety   . GERD (gastroesophageal reflux disease)   . Multiple myeloma ([place])   . BPH (benign prostatic hypertrophy)   . Pleural effusion     left  . Inguinal hernia   . Sleep apnea   . Major depression ([place])   . Hyperlipidemia     Past Surgical History  Procedure Laterality Date  . Colonoscopy    . Appendectomy    . Hip fracture surgery    . Cardiac catheterization  2002  . Cardiac catheterization  06/2013    Family History  Problem Relation Age of Onset  . Heart disease Mother   . Stroke Mother   . Diabetes Father   . Dementia Father   . Stroke Sister   . Stroke Brother   . Stroke Sister   . Stroke Sister   . Stroke Brother   . Stroke Brother     Social History   Social History  . Marital Status: Married    Spouse Name: N/A  . Number of Children: N/A  . Years of Education: N/A   Occupational History  . Not on file.   Social History Main Topics  . Smoking status: Former Smoker -- 2.00 packs/day for 7 years    Types: Cigarettes    Quit date: 09/17/1953  . Smokeless tobacco: Never Used  . Alcohol Use: No  . Drug Use: No  . Sexual  Activity: Not Currently   Other Topics Concern  . Not on file   Social History Narrative     Current outpatient prescriptions:  .  acetaminophen (TYLENOL) 500 MG tablet, Take 500 mg by mouth 2 (two) times daily., Disp: , Rfl:  .  aspirin EC 81 MG tablet, Take 1 tablet (81 mg total) by mouth daily., Disp: 90 tablet, Rfl: 3 .  benzonatate (TESSALON) 100 MG capsule, Take 1 capsule (100 mg total) by mouth 3 (three) times daily as needed for cough., Disp: 21 capsule, Rfl: 0 .  Cholecalciferol (D 5000) 5000 UNITS TABS, Take by mouth., Disp: , Rfl:  .  clindamycin (CLEOCIN) 300 MG capsule, Take 1 capsule (300 mg total) by mouth 4 (four) times daily., Disp: 28 capsule, Rfl: 0 .  clonazePAM (KLONOPIN) 0.5 MG tablet, Take 1 tablet (0.5 mg total) by mouth 3 (three) times daily as needed for anxiety., Disp: 90 tablet, Rfl: 0 .  doxazosin (CARDURA) 1 MG tablet, Take 1 tablet (1 mg total) by mouth daily., Disp: 90 tablet, Rfl: 0 .  HYDROcodone-acetaminophen (NORCO/VICODIN) 5-325  MG tablet, Take 1 tablet by mouth 3 (three) times daily as needed for moderate pain or severe pain., Disp: 90 tablet, Rfl: 0 .  HYDROcodone-acetaminophen (NORCO/VICODIN) 5-325 MG tablet, Take by mouth., Disp: , Rfl:  .  isosorbide mononitrate (IMDUR) 30 MG 24 hr tablet, Take 1 tablet (30 mg total) by mouth daily., Disp: 90 tablet, Rfl: 1 .  lisinopril (PRINIVIL,ZESTRIL) 2.5 MG tablet, Take 1 tablet (2.5 mg total) by mouth daily., Disp: 90 tablet, Rfl: 0 .  metoprolol tartrate (LOPRESSOR) 25 MG tablet, Take 1 tablet (25 mg total) by mouth 2 (two) times daily., Disp: 180 tablet, Rfl: 1 .  Multiple Vitamins-Minerals (PRESERVISION/LUTEIN PO), Take by mouth 2 (two) times daily., Disp: , Rfl:  .  nitroGLYCERIN (NITROSTAT) 0.4 MG SL tablet, Place 0.4 mg under the tongue every 5 (five) minutes as needed for chest pain., Disp: , Rfl:  .  pantoprazole (PROTONIX) 40 MG tablet, Take 1 tablet (40 mg total) by mouth daily., Disp: 90 tablet, Rfl:  0 .  polyethylene glycol (MIRALAX / GLYCOLAX) packet, Take 17 g by mouth daily., Disp: , Rfl:  .  pravastatin (PRAVACHOL) 40 MG tablet, Take 1 tablet (40 mg total) by mouth daily., Disp: 90 tablet, Rfl: 0 .  sertraline (ZOLOFT) 100 MG tablet, Take 1.5 tablets (150 mg total) by mouth daily., Disp: 45 tablet, Rfl: 2  Allergies  Allergen Reactions  . Sulfamethoxazole-Trimethoprim Nausea Only and Other (See Comments)  . Baclofen      Review of Systems  Psychiatric/Behavioral: Positive for depression. The patient is nervous/anxious and has insomnia.     Objective  Filed Vitals:   05/30/15 1330  BP: 108/70  Pulse: 72  Temp: 98.1 F (36.7 C)  TempSrc: Oral  Resp: 16  Height: '5\' 11"'$  (1.803 m)  Weight: 208 lb 11.2 oz (94.666 kg)  SpO2: 93%    Physical Exam  Constitutional: He is oriented to person, place, and time and well-developed, well-nourished, and in no distress.  Cardiovascular: Normal rate and regular rhythm.   Pulmonary/Chest: Effort normal and breath sounds normal.  Neurological: He is alert and oriented to person, place, and time.  Psychiatric: Memory, affect and judgment normal.  Nursing note and vitals reviewed.     Assessment & Plan  1. Generalized anxiety disorder Symptoms stable, continue clonazepam as needed. Refills provided. - clonazePAM (KLONOPIN) 0.5 MG tablet; Take 1 tablet (0.5 mg total) by mouth 3 (three) times daily as needed for anxiety.  Dispense: 90 tablet; Refill: 0   Reverie Vaquera Asad A. [facility] 05/30/2015 1:45 PM

## 2015-06-12 ENCOUNTER — Ambulatory Visit: Payer: Medicare Other

## 2015-06-12 DIAGNOSIS — I6529 Occlusion and stenosis of unspecified carotid artery: Secondary | ICD-10-CM

## 2015-06-17 ENCOUNTER — Encounter: Payer: Self-pay | Admitting: Cardiovascular Disease

## 2015-06-17 ENCOUNTER — Ambulatory Visit (INDEPENDENT_AMBULATORY_CARE_PROVIDER_SITE_OTHER): Payer: Medicare Other | Admitting: Cardiovascular Disease

## 2015-06-17 VITALS — BP 100/52 | HR 65 | Ht 70.0 in | Wt 203.8 lb

## 2015-06-17 DIAGNOSIS — I6529 Occlusion and stenosis of unspecified carotid artery: Secondary | ICD-10-CM

## 2015-06-17 DIAGNOSIS — I10 Essential (primary) hypertension: Secondary | ICD-10-CM | POA: Diagnosis not present

## 2015-06-17 DIAGNOSIS — I2510 Atherosclerotic heart disease of native coronary artery without angina pectoris: Secondary | ICD-10-CM | POA: Diagnosis not present

## 2015-06-17 NOTE — Progress Notes (Signed)
  Cardiology Office Note   Date:  06/17/2015   ID:  [name], DOB 12/15/1923, MRN [id number]  PCP:  Brandon Shah, MD  Cardiologist:   Brandon Arida, MD   Chief Complaint  Patient presents with  . other    6 month f/u no complaints. Meds reviewed verbally.      History of Present Illness: [name] is a 82 y.o. male who presents for a follow-up visit regarding coronary artery disease.  He has known history of coronary artery disease status post LAD PCI with drug-eluting stent placement in May of 2015 at [facility], hypertension and hyperlipidemia. His cardiac cath showed subtotally occluded RCA with left-to-right collaterals, 30% ostial left circumflex and 50-70% proximal LAD stenosis with an IFR ratio of 0.82. The LAD was stented with a 3.5 x 24 mm Promus drug-eluting stent.   He lives at [place].  He walks with a walker. He underwent carotid Doppler last week in our office which showed mild nonobstructive atherosclerosis. He is doing well and denies any chest pain or significant shortness of breath.   Past Medical History  Diagnosis Date  . Carotid stenosis   . Lymphoblastic lymphoma (HCC)   . Hx of [name]   . Coronary artery disease   . Dyslipidemia   . Hypertension   . Chronic back pain   . Anxiety   . GERD (gastroesophageal reflux disease)   . Multiple myeloma (HCC)   . BPH (benign prostatic hypertrophy)   . Pleural effusion     left  . Inguinal hernia   . Sleep apnea   . Major depression (HCC)   . Hyperlipidemia     Past Surgical History  Procedure Laterality Date  . Colonoscopy    . Appendectomy    . Hip fracture surgery    . Cardiac catheterization  2002  . Cardiac catheterization  06/2013     Current Outpatient Prescriptions  Medication Sig Dispense Refill  . aspirin EC 81 MG tablet Take 1 tablet (81 mg total) by mouth daily. 90 tablet 3  . Cholecalciferol (D 5000) 5000 UNITS TABS Take by mouth.    . clindamycin (CLEOCIN) 300 MG capsule Take 1  capsule (300 mg total) by mouth 4 (four) times daily. 28 capsule 0  . clonazePAM (KLONOPIN) 0.5 MG tablet Take 1 tablet (0.5 mg total) by mouth 3 (three) times daily as needed for anxiety. 90 tablet 0  . doxazosin (CARDURA) 1 MG tablet Take 1 tablet (1 mg total) by mouth daily. 90 tablet 0  . HYDROcodone-acetaminophen (NORCO/VICODIN) 5-325 MG tablet Take 1 tablet by mouth 3 (three) times daily as needed.     . isosorbide mononitrate (IMDUR) 30 MG 24 hr tablet Take 1 tablet (30 mg total) by mouth daily. 90 tablet 1  . lisinopril (PRINIVIL,ZESTRIL) 2.5 MG tablet Take 1 tablet (2.5 mg total) by mouth daily. 90 tablet 0  . metoprolol tartrate (LOPRESSOR) 25 MG tablet Take 1 tablet (25 mg total) by mouth 2 (two) times daily. 180 tablet 1  . Multiple Vitamins-Minerals (PRESERVISION/LUTEIN PO) Take by mouth 2 (two) times daily.    . nitroGLYCERIN (NITROSTAT) 0.4 MG SL tablet Place 0.4 mg under the tongue every 5 (five) minutes as needed for chest pain.    . pantoprazole (PROTONIX) 40 MG tablet Take 1 tablet (40 mg total) by mouth daily. 90 tablet 0  . polyethylene glycol (MIRALAX / GLYCOLAX) packet Take 17 g by mouth daily.    .   pravastatin (PRAVACHOL) 40 MG tablet Take 1 tablet (40 mg total) by mouth daily. 90 tablet 0  . sertraline (ZOLOFT) 100 MG tablet Take 1.5 tablets (150 mg total) by mouth daily. 45 tablet 2   No current facility-administered medications for this visit.    Allergies:   Sulfamethoxazole-trimethoprim and Baclofen    Social History:  The patient  reports that he quit smoking about 61 years ago. His smoking use included Cigarettes. He has a 14 pack-year smoking history. He has never used smokeless tobacco. He reports that he does not drink alcohol or use illicit drugs.   Family History:  The patient's family history includes Dementia in his father; Diabetes in his father; Heart disease in his mother; Stroke in his brother, brother, brother, mother, sister, sister, and sister.     ROS:  Please see the history of present illness.   Otherwise, review of systems are positive for none.   All other systems are reviewed and negative.    PHYSICAL EXAM: VS:  BP 100/52 mmHg  Pulse 65  Ht 5' 10" (1.778 m)  Wt 203 lb 12 oz (92.42 kg)  BMI 29.23 kg/m2 , BMI Body mass index is 29.23 kg/(m^2). GEN: Well nourished, well developed, in no acute distress HEENT: normal Neck: no JVD, carotid bruits, or masses Cardiac: RRR; no murmurs, rubs, or gallops,no edema  Respiratory:  clear to auscultation bilaterally, normal work of breathing GI: soft, nontender, nondistended, + BS MS: no deformity or atrophy Skin: warm and dry, no rash Neuro:  Strength and sensation are intact Psych: euthymic mood, full affect   EKG:  EKG is ordered today. The ekg ordered today demonstrates Normal sinus rhythm with first degree AV block.    Recent Labs: 09/06/2014: ALT 11; BUN 27; Creatinine, Ser 1.32*; Potassium 5.6*; Sodium 139    Lipid Panel    Component Value Date/Time   CHOL 76* 07/23/2014 1006   CHOL 60 12/14/2013 0505   TRIG 79 07/23/2014 1006   TRIG 80 12/14/2013 0505   HDL 24* 07/23/2014 1006   HDL 24* 12/14/2013 0505   CHOLHDL 3.2 07/23/2014 1006   VLDL 16 12/14/2013 0505   LDLCALC 36 07/23/2014 1006   LDLCALC 20 12/14/2013 0505      Wt Readings from Last 3 Encounters:  06/17/15 203 lb 12 oz (92.42 kg)  05/30/15 208 lb 11.2 oz (94.666 kg)  04/29/15 204 lb 3 oz (92.619 kg)       ASSESSMENT AND PLAN:  1.coronary artery disease involving native coronary arteries without angina: He is overall doing well with no symptoms suggestive of angina. Continue medical therapy. I advised him to take low-dose aspirin daily given that he has not been doing so due to fear of extensive bruising.   2. Essential hypertension: Blood pressure is well controlled on current medications.    3. Hyperlipidemia: Continue treatment with pravastatin. Most recent LDL was 36.   Disposition:    FU with me in 6 months  Signed,  Brandon Arida, MD  06/17/2015 11:16 AM    [facility]   

## 2015-06-17 NOTE — Patient Instructions (Signed)
Medication Instructions: Continue same medications.   Labwork: None.   Procedures/Testing: None.   Follow-Up: 6 months with Dr. Arida.   Any Additional Special Instructions Will Be Listed Below (If Applicable).     If you need a refill on your cardiac medications before your next appointment, please call your pharmacy.   

## 2015-06-19 ENCOUNTER — Emergency Department
Admission: EM | Admit: 2015-06-19 | Discharge: 2015-06-20 | Disposition: A | Discharge status: Home / Self Care | Payer: Medicare Other | Attending: Emergency Medicine | Admitting: Emergency Medicine

## 2015-06-19 ENCOUNTER — Emergency Department: Payer: Medicare Other

## 2015-06-19 DIAGNOSIS — I2510 Atherosclerotic heart disease of native coronary artery without angina pectoris: Secondary | ICD-10-CM | POA: Insufficient documentation

## 2015-06-19 DIAGNOSIS — C9000 Multiple myeloma not having achieved remission: Secondary | ICD-10-CM | POA: Insufficient documentation

## 2015-06-19 DIAGNOSIS — E785 Hyperlipidemia, unspecified: Secondary | ICD-10-CM | POA: Insufficient documentation

## 2015-06-19 DIAGNOSIS — S41112A Laceration without foreign body of left upper arm, initial encounter: Secondary | ICD-10-CM | POA: Insufficient documentation

## 2015-06-19 DIAGNOSIS — W19XXXA Unspecified fall, initial encounter: Secondary | ICD-10-CM

## 2015-06-19 DIAGNOSIS — Y929 Unspecified place or not applicable: Secondary | ICD-10-CM | POA: Diagnosis not present

## 2015-06-19 DIAGNOSIS — Z87891 Personal history of nicotine dependence: Secondary | ICD-10-CM | POA: Insufficient documentation

## 2015-06-19 DIAGNOSIS — W1839XA Other fall on same level, initial encounter: Secondary | ICD-10-CM | POA: Diagnosis not present

## 2015-06-19 DIAGNOSIS — Z792 Long term (current) use of antibiotics: Secondary | ICD-10-CM | POA: Diagnosis not present

## 2015-06-19 DIAGNOSIS — Z7982 Long term (current) use of aspirin: Secondary | ICD-10-CM | POA: Insufficient documentation

## 2015-06-19 DIAGNOSIS — Z8679 Personal history of other diseases of the circulatory system: Secondary | ICD-10-CM | POA: Diagnosis not present

## 2015-06-19 DIAGNOSIS — Y9301 Activity, walking, marching and hiking: Secondary | ICD-10-CM | POA: Insufficient documentation

## 2015-06-19 DIAGNOSIS — Y999 Unspecified external cause status: Secondary | ICD-10-CM | POA: Insufficient documentation

## 2015-06-19 DIAGNOSIS — G510 Bell's palsy: Secondary | ICD-10-CM | POA: Insufficient documentation

## 2015-06-19 DIAGNOSIS — F339 Major depressive disorder, recurrent, unspecified: Secondary | ICD-10-CM | POA: Insufficient documentation

## 2015-06-19 DIAGNOSIS — S6991XA Unspecified injury of right wrist, hand and finger(s), initial encounter: Secondary | ICD-10-CM | POA: Diagnosis present

## 2015-06-19 DIAGNOSIS — S61411A Laceration without foreign body of right hand, initial encounter: Secondary | ICD-10-CM | POA: Insufficient documentation

## 2015-06-19 DIAGNOSIS — Z79899 Other long term (current) drug therapy: Secondary | ICD-10-CM | POA: Diagnosis not present

## 2015-06-19 DIAGNOSIS — I10 Essential (primary) hypertension: Secondary | ICD-10-CM | POA: Insufficient documentation

## 2015-06-19 MED ORDER — LIDOCAINE HCL (PF) 1 % IJ SOLN
15.0000 mL | Freq: Once | INTRAMUSCULAR | Status: DC
  Filled 2015-06-19: qty 15

## 2015-06-19 NOTE — ED Provider Notes (Signed)
[facility] Emergency Department Provider Note  ____________________________________________  Time seen: Approximately 11:07 PM  I have reviewed the triage vital signs and the nursing notes.   HISTORY  Chief Complaint Fall    HPI [name] is a 82 y.o. male who presents to emergency department status post fall. Patient states that he had hip and tib-fib fractures approximately a year and a half to 2 years ago. He has been doing physical therapy and exercises trying to regain his mobility. Patient typically uses a walker but states that today when he went to reach for his walker he pushed it away from himself instead of hitting the brakes causing himself to pitch forward and falling. Patient denies hitting his head or losing consciousness. He states that he caught himself with his right arm. He endorses skin tears to his right forearm and a laceration between the third and fourth digits of the right hand. Patient takes aspirin but does not take any other blood thinners. Patient denies any pain at this time. Patient reports full mobility of right arm.   Past Medical History  Diagnosis Date  . Carotid stenosis   . Lymphoblastic lymphoma ([place])   . Hx of [name]   . Coronary artery disease   . Dyslipidemia   . Hypertension   . Chronic back pain   . Anxiety   . GERD (gastroesophageal reflux disease)   . Multiple myeloma ([place])   . BPH (benign prostatic hypertrophy)   . Pleural effusion     left  . Inguinal hernia   . Sleep apnea   . Major depression ([place])   . Hyperlipidemia     Patient Active Problem List   Diagnosis Date Noted  . Encounters for administrative purpose 04/01/2015  . Skin lesion of face 12/24/2014  . Contusion of right hand 11/28/2014  . Herpes zoster 10/30/2014  . Need for immunization against influenza 10/24/2014  . Compression fracture of L4 lumbar vertebra (HCC) 09/28/2014  . Rectal or anal pain 08/13/2014  . Anxiety  disorder 07/23/2014  . BPH without obstruction/lower urinary tract symptoms 07/23/2014  . Atherosclerosis of coronary artery 07/23/2014  . Chronic LBP 07/23/2014  . Hypercholesteremia 07/23/2014  . Benign hypertension 07/23/2014  . Acid reflux 07/23/2014  . Chronic recurrent major depressive disorder ([place]) 07/23/2014  . GERD (gastroesophageal reflux disease) 07/23/2014  . Coronary artery disease involving native coronary artery without angina pectoris 07/06/2014  . Essential hypertension 07/06/2014  . Carotid stenosis 07/06/2014  . Hyperlipidemia   . H/O [name] 12/20/2011    Past Surgical History  Procedure Laterality Date  . Colonoscopy    . Appendectomy    . Hip fracture surgery    . Cardiac catheterization  2002  . Cardiac catheterization  06/2013    Current Outpatient Rx  Name  Route  Sig  Dispense  Refill  . aspirin EC 81 MG tablet   Oral   Take 1 tablet (81 mg total) by mouth daily.   90 tablet   3   . Cholecalciferol (D 5000) 5000 UNITS TABS   Oral   Take by mouth.         . clindamycin (CLEOCIN) 300 MG capsule   Oral   Take 1 capsule (300 mg total) by mouth 4 (four) times daily.   28 capsule   0   . clonazePAM (KLONOPIN) 0.5 MG tablet   Oral   Take 1 tablet (0.5 mg total) by mouth 3 (three) times  daily as needed for anxiety.   90 tablet   0   . doxazosin (CARDURA) 1 MG tablet   Oral   Take 1 tablet (1 mg total) by mouth daily.   90 tablet   0   . HYDROcodone-acetaminophen (NORCO/VICODIN) 5-325 MG tablet   Oral   Take 1 tablet by mouth 3 (three) times daily as needed.          . isosorbide mononitrate (IMDUR) 30 MG 24 hr tablet   Oral   Take 1 tablet (30 mg total) by mouth daily.   90 tablet   1   . lisinopril (PRINIVIL,ZESTRIL) 2.5 MG tablet   Oral   Take 1 tablet (2.5 mg total) by mouth daily.   90 tablet   0   . metoprolol tartrate (LOPRESSOR) 25 MG tablet   Oral   Take 1 tablet (25 mg total) by mouth 2 (two) times daily.    180 tablet   1   . Multiple Vitamins-Minerals (PRESERVISION/LUTEIN PO)   Oral   Take by mouth 2 (two) times daily.         . nitroGLYCERIN (NITROSTAT) 0.4 MG SL tablet   Sublingual   Place 0.4 mg under the tongue every 5 (five) minutes as needed for chest pain.         . pantoprazole (PROTONIX) 40 MG tablet   Oral   Take 1 tablet (40 mg total) by mouth daily.   90 tablet   0   . polyethylene glycol (MIRALAX / GLYCOLAX) packet   Oral   Take 17 g by mouth daily.         . pravastatin (PRAVACHOL) 40 MG tablet   Oral   Take 1 tablet (40 mg total) by mouth daily.   90 tablet   0   . sertraline (ZOLOFT) 100 MG tablet   Oral   Take 1.5 tablets (150 mg total) by mouth daily.   45 tablet   2     Allergies Sulfamethoxazole-trimethoprim and Baclofen  Family History  Problem Relation Age of Onset  . Heart disease Mother   . Stroke Mother   . Diabetes Father   . Dementia Father   . Stroke Sister   . Stroke Brother   . Stroke Sister   . Stroke Sister   . Stroke Brother   . Stroke Brother     Social History Social History  Substance Use Topics  . Smoking status: Former Smoker -- 2.00 packs/day for 7 years    Types: Cigarettes    Quit date: 09/17/1953  . Smokeless tobacco: Never Used  . Alcohol Use: No     Review of Systems  Constitutional: No fever/chills Eyes: No visual changes.  Cardiovascular: no chest pain. Respiratory: no cough. No SOB. Musculoskeletal: Negative for musculoskeletal pain. Skin: Positive for skin tears to the right forearm. Positive for laceration to the interdigital space third fourth digit right hand. Neurological: Negative for headaches, focal weakness or numbness. 10-point ROS otherwise negative.  ____________________________________________   PHYSICAL EXAM:  VITAL SIGNS: ED Triage Vitals  Enc Vitals Group     BP 06/19/15 2113 137/72 mmHg     Pulse Rate 06/19/15 2113 67     Resp 06/19/15 2113 18     Temp 06/19/15 2113  98.3 F (36.8 C)     Temp Source 06/19/15 2113 Oral     SpO2 06/19/15 2113 100 %     Weight 06/19/15 2113 202 lb (91.627 kg)  Height 06/19/15 2113 '5\' 10"'$  (1.778 m)     Head Cir --      Peak Flow --      Pain Score 06/19/15 2113 5     Pain Loc --      Pain Edu? --      Excl. in [place]? --      Constitutional: Alert and oriented. Well appearing and in no acute distress. Eyes: Conjunctivae are normal. PERRL. EOMI. Head: Atraumatic. Neck: No stridor.  No cervical spine tenderness to palpation  Cardiovascular: Normal rate, regular rhythm. Normal S1 and S2.  Good peripheral circulation. Respiratory: Normal respiratory effort without tachypnea or retractions. Lungs CTAB. Good air entry to the bases with no decreased or absent breath sounds. Musculoskeletal: Full range of motion to all extremities. No gross deformities appreciated. Neurologic:  Normal speech and language. No gross focal neurologic deficits are appreciated.  Skin:  Skin abrasions are noted to the right forearm. These are superficial in nature. No bleeding at this time. Patient has a laceration to the interdigital space third and fourth digit. The stretches from the MCP joint on the dorsal aspect to the MCP joint palmar aspect. No visible foreign body. No bleeding at this time. Full range of motion to the third and fourth digit. Sensation intact third fourth digit. Cap refill intact to the third and fourth digit of the right hand. Psychiatric: Mood and affect are normal. Speech and behavior are normal. Patient exhibits appropriate insight and judgement.   ____________________________________________   LABS (all labs ordered are listed, but only abnormal results are displayed)  Labs Reviewed - No data to display ____________________________________________  EKG   ____________________________________________  RADIOLOGY Diamantina Providence Lurene Robley, personally viewed and evaluated these images (plain radiographs) as part of my  medical decision making, as well as reviewing the written report by the radiologist.  Dg Hand Complete Right  06/19/2015  CLINICAL DATA:  Pain after falling today. EXAM: RIGHT HAND - COMPLETE 3+ VIEW COMPARISON:  None. FINDINGS: Negative for acute fracture, dislocation or radiopaque foreign body. Moderately severe arthritic changes are present throughout the interphalangeal joint, also at the first MCP and first [place]. No acute soft tissue abnormality. IMPRESSION: Negative for acute fracture, dislocation or radiopaque foreign body. Electronically Signed   By: Andreas Newport M.D.   On: 06/19/2015 21:40    ____________________________________________    PROCEDURES  Procedure(s) performed:   LACERATION REPAIR Performed by: Darletta Moll Authorized by: Charline Bills Choua Chalker Consent: Verbal consent obtained. Risks and benefits: risks, benefits and alternatives were discussed Consent given by: patient Patient identity confirmed: provided demographic data Prepped and Draped in normal sterile fashion Wound explored  Laceration Location: Third fourth interdigital space  Laceration Length: 4 cm  No Foreign Bodies seen or palpated  Anesthesia: local infiltration  Local anesthetic: lidocaine 1 % without epinephrine  Anesthetic total: 12 ml  Irrigation method: syringe Amount of cleaning: standard  Skin closure: 4-0 Ethilon sutures   Number of sutures: 13   Technique: Simple interrupted   Patient tolerance: Patient tolerated the procedure well with no immediate complications.    SPLINT APPLICATION Date/Time: 75:10 AM Authorized by: Darletta Moll Consent: Verbal consent obtained. Risks and benefits: risks, benefits and alternatives were discussed Consent given by: patient Splint applied by: Betha Loa, PA-C Location details: Third and fourth digits  Splint type: Finger splint  Supplies used: 2 finger splints with Ace bandage  Post-procedure: The  splinted body part was neurovascularly unchanged following the procedure.  Patient tolerance: Patient tolerated the procedure well with no immediate complications.      Medications  lidocaine (PF) (XYLOCAINE) 1 % injection 15 mL (not administered)     ____________________________________________   INITIAL IMPRESSION / ASSESSMENT AND PLAN / ED COURSE  Pertinent labs & imaging results that were available during my care of the patient were reviewed by me and considered in my medical decision making (see chart for details).  Patient's diagnosis is consistent with a fall causing skin tears to the arm and laceration in the third and fourth interdigital space.[place] reveals no acute osseous abnormality to the right hand. Patient did not hit his head or lose consciousness at any point and no head CT is ordered. Patient's laceration to the third and fourth interdigital space is closed as described above. Patient tolerated procedure well. Fingers are splinted to provide stability to prevent separation of the sutured edges. Patient is currently on his tetanus immunization is not given a booster today. Patient will follow-up with his primary care provider and 10 days for suture removal. Patient is given ED precautions to return to the ED for any worsening or new symptoms.     ____________________________________________  FINAL CLINICAL IMPRESSION(S) / ED DIAGNOSES  Final diagnoses:  Fall, initial encounter  Hand laceration, right, initial encounter  Skin tear of left upper arm without complication, initial encounter      NEW MEDICATIONS STARTED DURING THIS VISIT:  New Prescriptions   No medications on file        This chart was dictated using voice recognition software/Dragon. Despite best efforts to proofread, errors can occur which can change the meaning. Any change was purely unintentional.    Darletta Moll, PA-C 06/20/15 0026  Carrie Mew, MD 06/23/15 2122

## 2015-06-19 NOTE — ED Notes (Signed)
Lido pulled and given to jonathan at this time for suturing

## 2015-06-19 NOTE — ED Notes (Signed)
Pt was practicing how to walk without walker, when he went to reach for walker it rolled forward and pt fell forward.  Pt denies any dizziness, small skint ear x 2 noted to right forearm and lac between 3rd and 4th fingers.

## 2015-06-19 NOTE — ED Notes (Signed)
Pt in via triage; pt reports trying to wean self off walker and was walking without it.  When he went to reach for it, walker was not locked and patient fell forward into walker.  Pt denies LOC, denies hitting head.  Pt with 2 skin tear to right forearm and laceration between 3rd and 4th digits on right hand.

## 2015-06-20 NOTE — Discharge Instructions (Signed)
Laceration Care, Adult °A laceration is a cut that goes through all of the layers of the skin and into the tissue that is right under the skin. Some lacerations heal on their own. Others need to be closed with stitches (sutures), staples, skin adhesive strips, or skin glue. Proper laceration care minimizes the risk of infection and helps the laceration to heal better. °HOW TO CARE FOR YOUR LACERATION °If sutures or staples were used: °· Keep the wound clean and dry. °· If you were given a bandage (dressing), you should change it at least one time per day or as told by your health care provider. You should also change it if it becomes wet or dirty. °· Keep the wound completely dry for the first 24 hours or as told by your health care provider. After that time, you may shower or bathe. However, make sure that the wound is not soaked in water until after the sutures or staples have been removed. °· Clean the wound one time each day or as told by your health care provider: °· Wash the wound with soap and water. °· Rinse the wound with water to remove all soap. °· Pat the wound dry with a clean towel. Do not rub the wound. °· After cleaning the wound, apply a thin layer of antibiotic ointment as told by your health care provider. This will help to prevent infection and keep the dressing from sticking to the wound. °· Have the sutures or staples removed as told by your health care provider. °If skin adhesive strips were used: °· Keep the wound clean and dry. °· If you were given a bandage (dressing), you should change it at least one time per day or as told by your health care provider. You should also change it if it becomes dirty or wet. °· Do not get the skin adhesive strips wet. You may shower or bathe, but be careful to keep the wound dry. °· If the wound gets wet, pat it dry with a clean towel. Do not rub the wound. °· Skin adhesive strips fall off on their own. You may trim the strips as the wound heals. Do not  remove skin adhesive strips that are still stuck to the wound. They will fall off in time. °If skin glue was used: °· Try to keep the wound dry, but you may briefly wet it in the shower or bath. Do not soak the wound in water, such as by swimming. °· After you have showered or bathed, gently pat the wound dry with a clean towel. Do not rub the wound. °· Do not do any activities that will make you sweat heavily until the skin glue has fallen off on its own. °· Do not apply liquid, cream, or ointment medicine to the wound while the skin glue is in place. Using those may loosen the film before the wound has healed. °· If you were given a bandage (dressing), you should change it at least one time per day or as told by your health care provider. You should also change it if it becomes dirty or wet. °· If a dressing is placed over the wound, be careful not to apply tape directly over the skin glue. Doing that may cause the glue to be pulled off before the wound has healed. °· Do not pick at the glue. The skin glue usually remains in place for 5-10 days, then it falls off of the skin. °General Instructions °· Take over-the-counter and prescription   medicines only as told by your health care provider.  If you were prescribed an antibiotic medicine or ointment, take or apply it as told by your doctor. Do not stop using it even if your condition improves.  To help prevent scarring, make sure to cover your wound with sunscreen whenever you are outside after stitches are removed, after adhesive strips are removed, or when glue remains in place and the wound is healed. Make sure to wear a sunscreen of at least 30 SPF.  Do not scratch or pick at the wound.  Keep all follow-up visits as told by your health care provider. This is important.  Check your wound every day for signs of infection. Watch for:  Redness, swelling, or pain.  Fluid, blood, or pus.  Raise (elevate) the injured area above the level of your heart  while you are sitting or lying down, if possible. SEEK MEDICAL CARE IF:  You received a tetanus shot and you have swelling, severe pain, redness, or bleeding at the injection site.  You have a fever.  A wound that was closed breaks open.  You notice a bad smell coming from your wound or your dressing.  You notice something coming out of the wound, such as wood or glass.  Your pain is not controlled with medicine.  You have increased redness, swelling, or pain at the site of your wound.  You have fluid, blood, or pus coming from your wound.  You notice a change in the color of your skin near your wound.  You need to change the dressing frequently due to fluid, blood, or pus draining from the wound.  You develop a new rash.  You develop numbness around the wound. SEEK IMMEDIATE MEDICAL CARE IF:  You develop severe swelling around the wound.  Your pain suddenly increases and is severe.  You develop painful lumps near the wound or on skin that is anywhere on your body.  You have a red streak going away from your wound.  The wound is on your hand or foot and you cannot properly move a finger or toe.  The wound is on your hand or foot and you notice that your fingers or toes look pale or bluish.   This information is not intended to replace advice given to you by your health care provider. Make sure you discuss any questions you have with your health care provider.   Document Released: 02/02/2005 Document Revised: 06/19/2014 Document Reviewed: 01/29/2014 Elsevier Interactive Patient Education 2016 [employer]  Skin Tear Care A skin tear is a wound in which the top layer of skin has peeled off. This is a common problem with aging because the skin becomes thinner and more fragile as a person gets older. In addition, some medicines, such as oral corticosteroids, can lead to skin thinning if taken for long periods of time.  A skin tear is often repaired with tape or skin  adhesive strips. This keeps the skin that has been peeled off in contact with the healthier skin beneath. Depending on the location of the wound, a bandage (dressing) may be applied over the tape or skin adhesive strips. Sometimes, during the healing process, the skin turns black and dies. Even when this happens, the torn skin acts as a good dressing until the skin underneath gets healthier and repairs itself. HOME CARE INSTRUCTIONS   Change dressings once per day or as directed by your caregiver.  Gently clean the skin tear and the area around  the tear using saline solution or mild soap and water.  Do not rub the injured skin dry. Let the area air dry.  Apply petroleum jelly or an antibiotic cream or ointment to keep the tear moist. This will help the wound heal. Do not allow a scab to form.  If the dressing sticks before the next dressing change, moisten it with warm soapy water and gently remove it.  Protect the injured skin until it has healed.  Only take over-the-counter or prescription medicines as directed by your caregiver.  Take showers or baths using warm soapy water. Apply a new dressing after the shower or bath.  Keep all follow-up appointments as directed by your caregiver.  SEEK IMMEDIATE MEDICAL CARE IF:   You have redness, swelling, or increasing pain in the skin tear.  You havepus coming from the skin tear.  You have chills.  You have a red streak that goes away from the skin tear.  You have a bad smell coming from the tear or dressing.  You have a fever or persistent symptoms for more than 2-3 days.  You have a fever and your symptoms suddenly get worse. MAKE SURE YOU:  Understand these instructions.  Will watch this condition.  Will get help right away if your child is not doing well or gets worse.   This information is not intended to replace advice given to you by your health care provider. Make sure you discuss any questions you have with your health  care provider.   Document Released: 10/28/2000 Document Revised: 10/28/2011 Document Reviewed: 08/17/2011 Elsevier Interactive Patient Education 2016 [place], [place], or Adhesive Wound Closure Health care providers use stitches (sutures), staples, and certain glue (skin adhesives) to hold skin together while it heals (wound closure). You may need this treatment after you have surgery or if you cut your skin accidentally. These methods help your skin to heal more quickly and make it less likely that you will have a scar. A wound may take several months to heal completely. The type of wound you have determines when your wound gets closed. In most cases, the wound is closed as soon as possible (primary skin closure). Sometimes, closure is delayed so the wound can be cleaned and allowed to heal naturally. This reduces the chance of infection. Delayed closure may be needed if your wound:  Is caused by a bite.  Happened more than 6 hours ago.  Involves loss of skin or the tissues under the skin.  Has dirt or debris in it that cannot be removed.  Is infected. WHAT ARE THE DIFFERENT KINDS OF WOUND CLOSURES? There are many options for wound closure. The one that your health care provider uses depends on how deep and how large your wound is. Adhesive Glue To use this type of glue to close a wound, your health care provider holds the edges of the wound together and paints the glue on the surface of your skin. You may need more than one layer of glue. Then the wound may be covered with a light bandage (dressing). This type of skin closure may be used for small wounds that are not deep (superficial). Using glue for wound closure is less painful than other methods. It does not require a medicine that numbs the area (local anesthetic). This method also leaves nothing to be removed. Adhesive glue is often used for children and on facial wounds. Adhesive glue cannot be used for wounds that  are deep, uneven,  or bleeding. It is not used inside of a wound.  Adhesive Strips These strips are made of sticky (adhesive), porous paper. They are applied across your skin edges like a regular adhesive bandage. You leave them on until they fall off. Adhesive strips may be used to close very superficial wounds. They may also be used along with sutures to improve the closure of your skin edges.  Sutures Sutures are the oldest method of wound closure. Sutures can be made from natural substances, such as silk, or from synthetic materials, such as nylon and steel. They can be made from a material that your body can break down as your wound heals (absorbable), or they can be made from a material that needs to be removed from your skin (nonabsorbable). They come in many different strengths and sizes. Your health care provider attaches the sutures to a steel needle on one end. Sutures can be passed through your skin, or through the tissues beneath your skin. Then they are tied and cut. Your skin edges may be closed in one continuous stitch or in separate stitches. Sutures are strong and can be used for all kinds of wounds. Absorbable sutures may be used to close tissues under the skin. The disadvantage of sutures is that they may cause skin reactions that lead to infection. Nonabsorbable sutures need to be removed. Staples When surgical staples are used to close a wound, the edges of your skin on both sides of the wound are brought close together. A staple is placed across the wound, and an instrument secures the edges together. Staples are often used to close surgical cuts (incisions). Staples are faster to use than sutures, and they cause less skin reaction. Staples need to be removed using a tool that bends the staples away from your skin. HOW DO I CARE FOR MY WOUND CLOSURE?  Take medicines only as directed by your health care provider.  If you were prescribed an antibiotic medicine for your wound,  finish it all even if you start to feel better.  Use ointments or creams only as directed by your health care provider.  Wash your hands with soap and water before and after touching your wound.  Do not soak your wound in water. Do not take baths, swim, or use a hot tub until your health care provider approves.  Ask your health care provider when you can start showering. Cover your wound if directed by your health care provider.  Do not take out your own sutures or staples.  Do not pick at your wound. Picking can cause an infection.  Keep all follow-up visits as directed by your health care provider. This is important. HOW LONG WILL I HAVE MY WOUND CLOSURE?  Leave adhesive glue on your skin until the glue peels away.  Leave adhesive strips on your skin until the strips fall off.  Absorbable sutures will dissolve within several days.  Nonabsorbable sutures and staples must be removed. The location of the wound will determine how long they stay in. This can range from several days to a couple of weeks. WHEN SHOULD I SEEK HELP FOR MY WOUND CLOSURE? Contact your health care provider if:  You have a fever.  You have chills.  You have drainage, redness, swelling, or pain at your wound.  There is a bad smell coming from your wound.  The skin edges of your wound start to separate after your sutures have been removed.  Your wound becomes thick, raised, and darker in  color after your sutures come out (scarring).   This information is not intended to replace advice given to you by your health care provider. Make sure you discuss any questions you have with your health care provider.   Document Released: 10/28/2000 Document Revised: 02/23/2014 Document Reviewed: 07/12/2013 Elsevier Interactive Patient Education [employer].

## 2015-06-26 ENCOUNTER — Other Ambulatory Visit: Payer: Self-pay | Admitting: Family Medicine

## 2015-06-26 DIAGNOSIS — F411 Generalized anxiety disorder: Secondary | ICD-10-CM

## 2015-06-26 MED ORDER — CLONAZEPAM 0.5 MG PO TABS: 0.5000 mg | ORAL_TABLET | Freq: Three times a day (TID) | ORAL | Status: DC | PRN

## 2015-06-26 NOTE — Telephone Encounter (Signed)
Routed to Dr. Shah for medication refill approval 

## 2015-06-26 NOTE — Telephone Encounter (Signed)
PT IS NEEDING HIS ANXIETY MEDS (CLONAZEPAM) REFILLED. WILL NEED THIS BY Friday.

## 2015-06-27 ENCOUNTER — Telehealth: Payer: Self-pay | Admitting: Family Medicine

## 2015-06-27 MED ORDER — LISINOPRIL 2.5 MG PO TABS
2.5000 mg | ORAL_TABLET | Freq: Every day | ORAL | Status: DC
Start: 2015-06-27 — End: 2015-09-24

## 2015-06-27 NOTE — Telephone Encounter (Signed)
Please send Lisinopril to [facility] [place]

## 2015-07-01 ENCOUNTER — Encounter: Payer: Self-pay | Admitting: Family Medicine

## 2015-07-01 ENCOUNTER — Ambulatory Visit (INDEPENDENT_AMBULATORY_CARE_PROVIDER_SITE_OTHER): Payer: Medicare Other | Admitting: Family Medicine

## 2015-07-01 ENCOUNTER — Ambulatory Visit: Payer: Medicare Other | Admitting: Family Medicine

## 2015-07-01 VITALS — BP 130/74 | HR 77 | Temp 98.9°F | Resp 16 | Ht 70.0 in | Wt 209.8 lb

## 2015-07-01 DIAGNOSIS — S61411D Laceration without foreign body of right hand, subsequent encounter: Secondary | ICD-10-CM

## 2015-07-01 DIAGNOSIS — L98429 Non-pressure chronic ulcer of back with unspecified severity: Secondary | ICD-10-CM | POA: Insufficient documentation

## 2015-07-01 DIAGNOSIS — K219 Gastro-esophageal reflux disease without esophagitis: Secondary | ICD-10-CM | POA: Diagnosis not present

## 2015-07-01 DIAGNOSIS — E7800 Pure hypercholesterolemia, unspecified: Secondary | ICD-10-CM

## 2015-07-01 DIAGNOSIS — S300XXA Contusion of lower back and pelvis, initial encounter: Secondary | ICD-10-CM | POA: Insufficient documentation

## 2015-07-01 DIAGNOSIS — I6529 Occlusion and stenosis of unspecified carotid artery: Secondary | ICD-10-CM

## 2015-07-01 DIAGNOSIS — S300XXD Contusion of lower back and pelvis, subsequent encounter: Secondary | ICD-10-CM | POA: Diagnosis not present

## 2015-07-01 DIAGNOSIS — S61411A Laceration without foreign body of right hand, initial encounter: Secondary | ICD-10-CM | POA: Insufficient documentation

## 2015-07-01 MED ORDER — PRAVASTATIN SODIUM 40 MG PO TABS: 40.0000 mg | ORAL_TABLET | Freq: Every day | ORAL | Status: DC

## 2015-07-01 MED ORDER — PANTOPRAZOLE SODIUM 40 MG PO TBEC: 40.0000 mg | DELAYED_RELEASE_TABLET | Freq: Every day | ORAL | Status: DC

## 2015-07-01 NOTE — Progress Notes (Signed)
Name: [name]   MRN: [id number]    DOB: 1923/08/18   Date:07/01/2015       Progress Note  Subjective  Chief Complaint  Chief Complaint  Patient presents with  . Follow-up    1 month recheck  . Hand Injury     follow up from ER stitches removed    Hand Injury  The incident occurred more than 1 week ago. The incident occurred at home. The injury mechanism was a fall. Pertinent negatives include no chest pain.  Hyperlipidemia This is a chronic problem. The problem is controlled. Recent lipid tests were reviewed and are normal. Pertinent negatives include no chest pain, leg pain, myalgias or shortness of breath. Current antihyperlipidemic treatment includes statins.  Gastroesophageal Reflux He reports no abdominal pain, no chest pain, no coughing, no heartburn, no nausea or no sore throat. This is a chronic problem. The problem has been unchanged. Pertinent negatives include no weight loss. He has tried a PPI for the symptoms. The treatment provided significant relief.  Slipped and landed on his right hand while trying to reach his walker. Received a laceration to the right hand (3rd and 4 th digits and interdigital spaces, was sutured in the ED and now presents for removal of sutures. According to patient, his hand is healing very well.  In addition, he also sustained injury to his upper back four days later in another fall in his bathroom, was shaving and almost finished when he described experiencing a bout of dizziness, described as the rooms spinning around him, he fell backwards thereafter, hitting his upper back on the bathtub.   Past Medical History  Diagnosis Date  . Carotid stenosis   . 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 Hx of [name]   . Coronary artery disease   . Dyslipidemia   . Hypertension   . Chronic back pain   . Anxiety   . GERD (gastroesophageal reflux disease)   . Multiple myeloma ([place])   . BPH (benign prostatic hypertrophy)   . Pleural effusion     left    . Inguinal hernia   . Sleep apnea   . Major depression ([place])   . Hyperlipidemia     Past Surgical History  Procedure Laterality Date  . Colonoscopy    . Appendectomy    . Hip fracture surgery    . Cardiac catheterization  2002  . Cardiac catheterization  06/2013    Family History  Problem Relation Age of Onset  . Heart disease Mother   . Stroke Mother   . Diabetes Father   . Dementia Father   . Stroke Sister   . Stroke Brother   . Stroke Sister   . Stroke Sister   . Stroke Brother   . Stroke Brother     Social History   Social History  . Marital Status: Married    Spouse Name: N/A  . Number of Children: N/A  . Years of Education: N/A   Occupational History  . Not on file.   Social History Main Topics  . Smoking status: Former Smoker -- 2.00 packs/day for 7 years    Types: Cigarettes    Quit date: 09/17/1953  . Smokeless tobacco: Never Used  . Alcohol Use: No  . Drug Use: No  . Sexual Activity: Not Currently   Other Topics Concern  . Not on file   Social History Narrative     Current outpatient prescriptions:  .  aspirin EC 81  MG tablet, Take 1 tablet (81 mg total) by mouth daily., Disp: 90 tablet, Rfl: 3 .  Cholecalciferol (D 5000) 5000 UNITS TABS, Take by mouth., Disp: , Rfl:  .  clindamycin (CLEOCIN) 300 MG capsule, Take 1 capsule (300 mg total) by mouth 4 (four) times daily., Disp: 28 capsule, Rfl: 0 .  clonazePAM (KLONOPIN) 0.5 MG tablet, Take 1 tablet (0.5 mg total) by mouth 3 (three) times daily as needed for anxiety., Disp: 90 tablet, Rfl: 0 .  doxazosin (CARDURA) 1 MG tablet, Take 1 tablet (1 mg total) by mouth daily., Disp: 90 tablet, Rfl: 0 .  HYDROcodone-acetaminophen (NORCO/VICODIN) 5-325 MG tablet, Take 1 tablet by mouth 3 (three) times daily as needed. , Disp: , Rfl:  .  isosorbide mononitrate (IMDUR) 30 MG 24 hr tablet, Take 1 tablet (30 mg total) by mouth daily., Disp: 90 tablet, Rfl: 1 .  lisinopril (PRINIVIL,ZESTRIL) 2.5 MG tablet,  Take 1 tablet (2.5 mg total) by mouth daily., Disp: 90 tablet, Rfl: 0 .  metoprolol tartrate (LOPRESSOR) 25 MG tablet, Take 1 tablet (25 mg total) by mouth 2 (two) times daily., Disp: 180 tablet, Rfl: 1 .  Multiple Vitamins-Minerals (PRESERVISION/LUTEIN PO), Take by mouth 2 (two) times daily., Disp: , Rfl:  .  nitroGLYCERIN (NITROSTAT) 0.4 MG SL tablet, Place 0.4 mg under the tongue every 5 (five) minutes as needed for chest pain., Disp: , Rfl:  .  pantoprazole (PROTONIX) 40 MG tablet, Take 1 tablet (40 mg total) by mouth daily., Disp: 90 tablet, Rfl: 0 .  polyethylene glycol (MIRALAX / GLYCOLAX) packet, Take 17 g by mouth daily., Disp: , Rfl:  .  pravastatin (PRAVACHOL) 40 MG tablet, Take 1 tablet (40 mg total) by mouth daily., Disp: 90 tablet, Rfl: 0 .  sertraline (ZOLOFT) 100 MG tablet, Take 1.5 tablets (150 mg total) by mouth daily., Disp: 45 tablet, Rfl: 2  Allergies  Allergen Reactions  . Sulfamethoxazole-Trimethoprim Nausea Only and Other (See Comments)  . Baclofen      Review of Systems  Constitutional: Negative for fever, chills and weight loss.  HENT: Negative for sore throat.   Respiratory: Negative for cough and shortness of breath.   Cardiovascular: Negative for chest pain.  Gastrointestinal: Negative for heartburn, nausea, vomiting and abdominal pain.  Musculoskeletal: Positive for back pain. Negative for myalgias.    Objective  Filed Vitals:   07/01/15 1343  BP: 130/74  Pulse: 77  Temp: 98.9 F (37.2 C)  TempSrc: Oral  Resp: 16  Height: 5' 10" (1.778 m)  Weight: 209 lb 12.8 oz (95.165 kg)  SpO2: 94%    Physical Exam  Constitutional: He is oriented to person, place, and time and well-developed, well-nourished, and in no distress.  HENT:  Head: Normocephalic and atraumatic.  Cardiovascular: Normal rate and regular rhythm.   Pulmonary/Chest: Effort normal and breath sounds normal.  Abdominal: Soft. Bowel sounds are normal.  Musculoskeletal:        Hands: Healing laceration between the right 3rd and 4th digits, sutures removed. No bleeding, surrounding erythema, no pain at the affected area at present.   Neurological: He is alert and oriented to person, place, and time.  Skin:  Two separate wounds on the uppe rback. The first one located in the middle, over the spine, involves full-thickness loss of skin, erythematous area with thick yellowish material over the wound, c/w pus.  The second one located on the right upper back, similar to first one with full thickness loss of skin, erythematous  base and yellowish pus.  Additionally, there are two hematomas on the back, first one on the left upper back, second and larger one on the right lower back.    Nursing note and vitals reviewed.     Assessment & Plan  1. Skin ulcer of back ([place]) Patient's wife had the wound covered with tight occlusive dressing,, there is accumulation of pus, no appreciable improvement in the wound. We will refer to wound care for further assessment - AMB referral to wound care center  2. Laceration of right hand, subsequent encounter Appears to be healing well, sutures were removed. No indication for any oral antibiotic therapy. Notes reviewed and discussed with patient in detail  3. Traumatic hematoma of lower back, subsequent encounter Should resolve with conservative management.  4. Gastroesophageal reflux disease without esophagitis  - pantoprazole (PROTONIX) 40 MG tablet; Take 1 tablet (40 mg total) by mouth daily.  Dispense: 90 tablet; Refill: 0  5. Hypercholesteremia  - pravastatin (PRAVACHOL) 40 MG tablet; Take 1 tablet (40 mg total) by mouth daily.  Dispense: 90 tablet; Refill: 0  Johnisha Louks Asad A[facility] 07/01/2015 1:51 PM

## 2015-07-05 ENCOUNTER — Encounter: Payer: Medicare Other | Attending: Surgery | Admitting: Surgery

## 2015-07-05 DIAGNOSIS — K219 Gastro-esophageal reflux disease without esophagitis: Secondary | ICD-10-CM | POA: Insufficient documentation

## 2015-07-05 DIAGNOSIS — Z87891 Personal history of nicotine dependence: Secondary | ICD-10-CM | POA: Diagnosis not present

## 2015-07-05 DIAGNOSIS — I6529 Occlusion and stenosis of unspecified carotid artery: Secondary | ICD-10-CM | POA: Insufficient documentation

## 2015-07-05 DIAGNOSIS — F419 Anxiety disorder, unspecified: Secondary | ICD-10-CM | POA: Diagnosis not present

## 2015-07-05 DIAGNOSIS — L98421 Non-pressure chronic ulcer of back limited to breakdown of skin: Secondary | ICD-10-CM | POA: Insufficient documentation

## 2015-07-05 DIAGNOSIS — I10 Essential (primary) hypertension: Secondary | ICD-10-CM | POA: Insufficient documentation

## 2015-07-05 DIAGNOSIS — W19XXXA Unspecified fall, initial encounter: Secondary | ICD-10-CM | POA: Diagnosis not present

## 2015-07-05 DIAGNOSIS — S21211A Laceration without foreign body of right back wall of thorax without penetration into thoracic cavity, initial encounter: Secondary | ICD-10-CM | POA: Insufficient documentation

## 2015-07-05 DIAGNOSIS — I2510 Atherosclerotic heart disease of native coronary artery without angina pectoris: Secondary | ICD-10-CM | POA: Diagnosis not present

## 2015-07-05 DIAGNOSIS — G510 Bell's palsy: Secondary | ICD-10-CM | POA: Insufficient documentation

## 2015-07-05 DIAGNOSIS — E785 Hyperlipidemia, unspecified: Secondary | ICD-10-CM | POA: Insufficient documentation

## 2015-07-05 DIAGNOSIS — S21212A Laceration without foreign body of left back wall of thorax without penetration into thoracic cavity, initial encounter: Secondary | ICD-10-CM | POA: Insufficient documentation

## 2015-07-06 NOTE — Progress Notes (Addendum)
AMOL, DOMANSKI ([id number]) Visit Report for 07/05/2015 Chief Complaint Document Details Patient Name: [name], [name] Date of Service: 07/05/2015 3:00 PM Medical Record Number: [id number] Patient Account Number: [id number] Date of Birth/Sex: [birth year] (82 y.o. Male) Treating RN: Cornell Barman Primary Care Physician: [facility], [place]ossie Der Other Clinician: Referring Physician: [facility], [place]ossie Der Treating Physician/Extender: Frann Rider in Treatment: 0 Information Obtained from: Patient Chief Complaint Patient presents to the wound care center for a consult due non healing wound to his left and right upper back which she's had for about 2 weeks. Electronic Signature(s) Signed: 07/05/2015 3:33:17 PM By: Christin Fudge MD, FACS Entered By: Christin Fudge on 07/05/2015 15:33:17 Sklar, Coyt L. ([id number]) -------------------------------------------------------------------------------- HPI Details Patient Name: Current, [name] Date of Service: 07/05/2015 3:00 PM Medical Record Number: [id number] Patient Account Number: [id number] Date of Birth/Sex: 1923-04-22 (82 y.o. Male) Treating RN: Cornell Barman Primary Care Physician: [facility], Dossie Der Other Clinician: Referring Physician: [facility], [facility] Treating Physician/Extender: Frann Rider in Treatment: 0 History of Present Illness Location: left and right upper back Quality: Patient reports experiencing a dull pain to affected area(s). Severity: Patient states wound (s) are getting better. Duration: Patient has had the wound for < 2 weeks prior to presenting for treatment Timing: Pain in wound is Intermittent (comes and goes Context: The wound occurred when the patient had a fall and hurt his back Modifying Factors: Other treatment(s) tried include:local care with Neosporin ointment Associated Signs and Symptoms: Patient reports having:scar tissue opens out every time they do dressing HPI Description: 82 year old patient who was sent to [place] by her PCPs office when he  sustained a fall and landed on his right hand and received a laceration of the right hand third and fourth digit and the interdigital space and this was sutured in the ER. He also had an unrelated injury when he hurt his back after different fall. the hand is healed but the back continues to be having an open wound. Past medical history significant for carotid stenosis, [name], [name], dyslipidemia, hypertension, anxiety, GERD, multiple myeloma, BPH, inguinal hernia and is status post appendectomy, hip fracture, cardiac catheterization. He smoked in the remote past and has not smoked for several years. Electronic Signature(s) Signed: 07/05/2015 3:34:46 PM By: Christin Fudge MD, FACS Previous Signature: 07/05/2015 2:56:00 PM Version By: Christin Fudge MD, FACS Entered By: Christin Fudge on 07/05/2015 15:34:46 Difatta, Izzy LMarland Kitchen ([id number]) -------------------------------------------------------------------------------- Physical Exam Details Patient Name: [name], [name] Date of Service: 07/05/2015 3:00 PM Medical Record Number: [id number] Patient Account Number: [id number] Date of Birth/Sex: Jul 11, 1923 (82 y.o. Male) Treating RN: Cornell Barman Primary Care Physician: [facility], [place]ossie Der Other Clinician: Referring Physician: [facility], [facility] Treating Physician/Extender: Frann Rider in Treatment: 0 Constitutional . Pulse regular. Respirations normal and unlabored. Afebrile. . Eyes Nonicteric. Reactive to light. Ears, Nose, Mouth, and Throat Lips, teeth, and gums WNL.Marland Kitchen Moist mucosa without lesions. Neck supple and nontender. No palpable supraclavicular or cervical adenopathy. Normal sized without goiter. Respiratory WNL. No retractions.. Breath sounds WNL, No rubs, rales, rhonchi, or wheeze.. Cardiovascular Heart rhythm and rate regular, no murmur or gallop.. Pedal Pulses WNL. No clubbing, cyanosis or edema. Gastrointestinal (GI) Abdomen without masses or tenderness.. No liver or spleen enlargement or  tenderness.. Lymphatic No adneopathy. No adenopathy. No adenopathy. Musculoskeletal Adexa without tenderness or enlargement.. Digits and nails w/o clubbing, cyanosis, infection, petechiae, ischemia, or inflammatory conditions.. Integumentary (Hair, Skin) No suspicious lesions. No crepitus or fluctuance. No peri-wound warmth or erythema. No masses.Marland Kitchen Psychiatric Judgement and insight Intact.Marland Kitchen No  evidence of depression, anxiety, or agitation.. Notes On the back on either side of the midline he has 2 superficial lacerated wounds with some hypergranulation tissue but no evidence of cellulitis. there is mild tenderness over this wounds. Electronic Signature(s) Signed: 07/05/2015 3:36:01 PM By: Christin Fudge MD, FACS Entered By: Christin Fudge on 07/05/2015 15:36:01 Mchaney, Mendy Carlean Jews ([id number]) -------------------------------------------------------------------------------- Physician Orders Details Patient Name: [name], [name] Date of Service: 07/05/2015 3:00 PM Medical Record Number: [id number] Patient Account Number: [id number] Date of Birth/Sex: March 20, 1923 (81 y.o. Male) Treating RN: Cornell Barman Primary Care Physician: [facility], Dossie Der Other Clinician: Referring Physician: [facility], Dossie Der Treating Physician/Extender: Frann Rider in Treatment: 0 Verbal / Phone Orders: Yes Clinician: Cornell Barman Read Back and Verified: Yes Diagnosis Coding Wound Cleansing Wound #1 Right Scapula o Cleanse wound with mild soap and water Wound #2 Midline Back o Cleanse wound with mild soap and water Skin Barriers/Peri-Wound Care Wound #1 Right Scapula o Skin Prep Wound #2 Midline Back o Skin Prep Primary Wound Dressing Wound #1 Right Scapula o Other: - Siltec Sorbact Wound #2 Midline Back o Other: - Siltec Sorbact Dressing Change Frequency Wound #1 Right Scapula o Other: - As needed, once dressing is soiled Wound #2 Midline Back o Other: - As needed, once dressing is soiled Follow-up  Appointments Wound #1 Right Scapula o Return Appointment in 1 week. Wound #2 Midline Back o Return Appointment in 1 week. TAVARIS, EUDY ([id number]) Electronic Signature(s) Signed: 07/05/2015 3:46:07 PM By: Christin Fudge MD, FACS Signed: 07/05/2015 5:30:12 PM By: Gretta Cool RN, BSN, Kim RN, BSN Entered By: Gretta Cool, RN, BSN, Kim on 07/05/2015 15:23:09 Shepherd, Stockham LMarland Kitchen ([id number]) -------------------------------------------------------------------------------- Problem List Details Patient Name: Heringer, Ladarrion L. Date of Service: 07/05/2015 3:00 PM Medical Record Number: [id number] Patient Account Number: [id number] Date of Birth/Sex: June 07, 1923 (82 y.o. 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 ([id number]) -------------------------------------------------------------------------------- Progress Note Details Patient Name: Furrow, [name] Date of Service: 07/05/2015 3:00 PM Medical Record Number: [id number] Patient Account Number: [id number] Date of Birth/Sex: 09-04-1923 (82 y.o. Male) Treating RN: Cornell Barman Primary Care Physician: [facility], Dossie Der Other Clinician: Referring Physician: [facility], Dossie Der Treating Physician/Extender: Frann Rider  in Treatment: 0 Subjective Chief Complaint Information obtained from Patient Patient presents to the wound care center for a consult due non healing wound to his left and right upper back which she's had for about 2 weeks. History of Present Illness (HPI) The following HPI elements were documented for the patient's wound: Location: left and right upper back Quality: Patient reports experiencing a dull pain to affected area(s). Severity: Patient states wound (s) are getting better. Duration: Patient has had the wound for < 2 weeks prior to presenting for treatment Timing: Pain in wound is Intermittent (comes and goes Context: The wound occurred when the patient had a fall and hurt his back Modifying Factors: Other treatment(s) tried include:local care with Neosporin ointment  Associated Signs and Symptoms: Patient reports having:scar tissue opens out every time they do dressing 82 year old patient who was sent to [place] by her PCPs office when he sustained a fall and landed on his right hand and received a laceration of the right hand third and fourth digit and the interdigital space and this was sutured in the ER. He also had an unrelated injury when he hurt his back after different fall. the hand is healed but the back continues to be having an open wound. Past medical history significant for carotid stenosis, [name], [name], dyslipidemia, hypertension, anxiety, GERD, multiple myeloma, BPH, inguinal hernia and is status post appendectomy, hip fracture, cardiac catheterization. He smoked in the remote past and has not smoked for several years. Wound History Patient presents with 2 open wounds that have been present for approximately 2 weeks. Patient has been treating wounds in the following manner: neosporin. Laboratory tests have not been performed in the last month. Patient reportedly has not tested positive for an antibiotic resistant organism. Patient History Information obtained from  Patient, Chart. Allergies baclofen, Bactrim Family History Pritts, Trevor L. ([id number]) Diabetes - Father, Heart Disease - Mother, Stroke - Mother, Siblings, No family history of Cancer, Hypertension, Kidney Disease, Lung Disease, Seizures. Social History Former smoker, Marital Status - Married, Alcohol Use - Never, Drug Use - No History, Caffeine Use - Daily. Medical History Eyes Patient has history of Cataracts - removed Denies history of Glaucoma, Optic Neuritis Ear/Nose/Mouth/Throat Patient has history of Chronic sinus problems/congestion Denies history of Middle ear problems Hematologic/Lymphatic Denies history of Anemia, Hemophilia, Human Immunodeficiency Virus, Lymphedema, Sickle Cell Disease Respiratory Patient has history of Sleep Apnea Denies history of Aspiration, Asthma, Chronic Obstructive Pulmonary Disease (COPD), Pneumothorax, Tuberculosis Cardiovascular Patient has history of Coronary Artery Disease - stent 3 years ago Denies history of Angina, Arrhythmia, Congestive Heart Failure, Deep Vein Thrombosis, Hypertension, Hypotension, Myocardial Infarction, Peripheral Arterial Disease, Peripheral Venous Disease, Phlebitis, Vasculitis Gastrointestinal Denies history of Cirrhosis , Colitis, Crohn s, Hepatitis A, Hepatitis B, Hepatitis C Endocrine Denies history of Type I Diabetes, Type II Diabetes Genitourinary Denies history of End Stage Renal Disease Immunological Denies history of Lupus Erythematosus, Raynaud s, Scleroderma Integumentary (Skin) Denies history of History of Burn, History of pressure wounds Musculoskeletal Patient has history of Osteoarthritis - back Denies history of Gout, Rheumatoid Arthritis, Osteomyelitis Neurologic Denies history of Dementia, Neuropathy, Quadriplegia, Paraplegia, Seizure Disorder Oncologic Patient has history of Received Chemotherapy Denies history of Received Radiation Psychiatric Denies history of Anorexia/bulimia Medical  And Surgical History Notes Constitutional Symptoms (General Health) Carotid stenosis; [name]; Mutiple Myeloma; Lymphoma; BPH Review of Systems (ROS) Constitutional Symptoms (General Health) Magloire, Aul L. ([id number]) The patient has no complaints or symptoms. Eyes The patient has no complaints or symptoms. Ear/Nose/Mouth/Throat Complains or has symptoms of Sinusitis. Hematologic/Lymphatic The patient has no complaints or symptoms. Respiratory Denies complaints or symptoms of Chronic or frequent coughs, Shortness of Breath. Cardiovascular The patient has no complaints or symptoms. Gastrointestinal The patient has no complaints or symptoms. Endocrine The patient has no complaints or symptoms. Genitourinary The patient has no complaints or symptoms. Integumentary (Skin) Complains or has symptoms of Wounds, Bleeding or bruising tendency. Denies complaints or symptoms of Breakdown, Swelling. Musculoskeletal The patient has no complaints or symptoms. Neurologic The patient has no complaints or symptoms. Oncologic The patient has no complaints or symptoms. Psychiatric The patient has no complaints or symptoms. Medications: I have reviewed her list of his medications which include Klonopin, cholecalciferol, Cardura, Vicodin,  Imdur, Zestril, Lopressor, Nitrostat, Protonix, MiraLAX, Pravachol, Zoloft. Objective Constitutional Pulse regular. Respirations normal and unlabored. Afebrile. Vitals Time Taken: 2:54 PM, Height: 70 in, Source: Measured, Weight: 203 lbs, Source: Measured, BMI: 29.1, Temperature: 98.1 F, Pulse: 73 bpm, Respiratory Rate: 18 breaths/min, Blood Pressure: 126/60 mmHg. Peeks, Alston L. ([id number]) Eyes Nonicteric. Reactive to light. Ears, Nose, Mouth, and Throat Lips, teeth, and gums WNL.Marland Kitchen Moist mucosa without lesions. Neck supple and nontender. No palpable supraclavicular or cervical adenopathy. Normal sized without goiter. Respiratory WNL. No retractions..  Breath sounds WNL, No rubs, rales, rhonchi, or wheeze.. Cardiovascular Heart rhythm and rate regular, no murmur or gallop.. Pedal Pulses WNL. No clubbing, cyanosis or edema. Gastrointestinal (GI) Abdomen without masses or tenderness.. No liver or spleen enlargement or tenderness.. Lymphatic No adneopathy. No adenopathy. No adenopathy. Musculoskeletal Adexa without tenderness or enlargement.. Digits and nails w/o clubbing, cyanosis, infection, petechiae, ischemia, or inflammatory conditions.Marland Kitchen Psychiatric Judgement and insight Intact.. No evidence of depression, anxiety, or agitation.. General Notes: On the back on either side of the midline he has 2 superficial lacerated wounds with some hypergranulation tissue but no evidence of cellulitis. there is mild tenderness over this wounds. Integumentary (Hair, Skin) No suspicious lesions. No crepitus or fluctuance. No peri-wound warmth or erythema. No masses.. Wound #1 status is Open. Original cause of wound was Trauma. The wound is located on the Right Scapula. The wound measures 2.5cm length x 3.8cm width x 0.1cm depth; 7.461cm^2 area and 0.746cm^3 volume. The wound is limited to skin breakdown. There is no tunneling or undermining noted. There is a medium amount of serosanguineous drainage noted. The wound margin is flat and intact. There is large (67-100%) red, friable granulation within the wound bed. There is no necrotic tissue within the wound bed. The periwound skin appearance exhibited: Friable. The periwound skin appearance did not exhibit: Callus, Crepitus, Excoriation, Fluctuance, Induration, Localized Edema, Rash, Scarring, Dry/Scaly, Maceration, Moist, Atrophie Blanche, Cyanosis, Ecchymosis, Hemosiderin Staining, Mottled, Pallor, Rubor, Erythema. Periwound temperature was noted as No Abnormality. The periwound has tenderness on palpation. Wound #2 status is Open. Original cause of wound was Trauma. The wound is located on the Midline  Back. The wound measures 3cm length x 1.8cm width x 0.1cm depth; 4.241cm^2 area and 0.424cm^3 volume. The wound is limited to skin breakdown. There is no tunneling or undermining noted. There is a medium amount of serosanguineous drainage noted. The wound margin is flat and intact. There is large (67-100%) red, friable granulation within the wound bed. The periwound skin appearance exhibited: Friable, Moist. The periwound skin appearance did not exhibit: Callus, Crepitus, Excoriation, Fluctuance, Induration, Localized Sotomayor, Kaladin L. ([id number]) Edema, Rash, Scarring, Dry/Scaly, Maceration, Atrophie Blanche, Cyanosis, Ecchymosis, Hemosiderin Staining, Mottled, Pallor, Rubor, Erythema. 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 I have recommended Cutimed Siltec Sorbact foam to be applied every second or third day as needed. Wound care has been discussed with him and his wife in great detail and he will be seen back for review next week. Plan Wound Cleansing: Wound #1 Right  Scapula: Cleanse wound with mild soap and water Wound #2 Midline Back: Cleanse wound with mild soap and water Skin Barriers/Peri-Wound Care: Wound #1 Right Scapula: Skin Prep Wound #2 Midline Back: Skin Prep Primary Wound Dressing: Wound #1 Right Scapula: Other: - Siltec Sorbact Wound #2 Midline Back: Other: - Siltec Sorbact Dressing Change Frequency: Wound #1 Right Scapula: Other: - As needed,  once dressing is soiled Clyatt, Yishai L. ([id number]) Wound #2 Midline Back: Other: - As needed, once dressing is soiled Follow-up Appointments: Wound #1 Right Scapula: Return Appointment in 1 week. Wound #2 Midline Back: Return Appointment in 1 week. Elderly gentleman with no diabetes mellitus or any significant comorbidities has had a lacerated wound on the upper back which continues to be tender and has been nonhealing for a couple of weeks. I have recommended Cutimed Siltec Sorbact foam to be applied every second or third day as needed. Wound care has been discussed with him and his wife in great detail and he will be seen back for review next week. Electronic Signature(s) Signed: 07/05/2015 3:38:26 PM By: Christin Fudge MD, FACS Entered By: Christin Fudge on 07/05/2015 15:38:25 Durio, Purvis LMarland Kitchen ([id number]) -------------------------------------------------------------------------------- ROS/PFSH Details Patient Name: [name], [name] Date of Service: 07/05/2015 3:00 PM Medical Record Number: [id number] Patient Account Number: [id number] Date of Birth/Sex: [birth year] (82 y.o. Male) Treating RN: Cornell Barman Primary Care Physician: [facility], [facility] Other Clinician: Referring Physician: [facility], [facility] Treating Physician/Extender: Frann Rider in Treatment: 0 Information Obtained From Patient Chart Wound History Do you currently have one or more open woundso Yes How many open wounds do you currently haveo 2 Approximately how long have you had your woundso 2 weeks How have you been treating your wound(s) until nowo neosporin Has your wound(s) ever healed and then re-openedo No Have you had any lab work done in the past montho No Have you tested positive for an antibiotic resistant organism (MRSA, VRE)o No Ear/Nose/Mouth/Throat Complaints and Symptoms: Positive for: Sinusitis Medical History: Positive for: Chronic sinus problems/congestion Negative for: Middle ear  problems Respiratory Complaints and Symptoms: Negative for: Chronic or frequent coughs; Shortness of Breath Medical History: Positive for: Sleep Apnea Negative for: Aspiration; Asthma; Chronic Obstructive Pulmonary Disease (COPD); Pneumothorax; Tuberculosis Cardiovascular Complaints and Symptoms: No Complaints or Symptoms Complaints and Symptoms: Negative for: Chest pain; LE edema Medical History: Positive for: Coronary Artery Disease - stent 3 years ago Negative for: Angina; Arrhythmia; Congestive Heart Failure; Deep Vein Thrombosis; Hypertension; Hypotension; Myocardial Infarction; Peripheral Arterial Disease; Peripheral Venous Disease; Phlebitis; Fowers, [name] ([id number]) Vasculitis Integumentary (Skin) Complaints and Symptoms: Positive for: Wounds; Bleeding or bruising tendency Negative for: Breakdown; Swelling Medical History: Negative for: History of Burn; History of pressure wounds Constitutional Symptoms (General Health) Complaints and Symptoms: No Complaints or Symptoms Medical History: Past Medical History Notes: Carotid stenosis; [name]; Mutiple Myeloma; Lymphoma; BPH Eyes Complaints and Symptoms: No Complaints or Symptoms Medical History: Positive for: Cataracts - removed Negative for: Glaucoma; Optic Neuritis Hematologic/Lymphatic Complaints and Symptoms: No Complaints or Symptoms Medical History: Negative for: Anemia; Hemophilia; Human Immunodeficiency Virus; Lymphedema; Sickle Cell Disease Gastrointestinal Complaints and Symptoms: No Complaints or Symptoms Medical History: Negative for: Cirrhosis ; Colitis; Crohnos; Hepatitis A; Hepatitis B; Hepatitis C Endocrine Complaints and Symptoms: No Complaints or Symptoms Medical History: JAZMINE, [name]. ([id number]) Negative for: Type I Diabetes; Type II Diabetes Genitourinary Complaints and Symptoms: No Complaints or Symptoms Medical History: Negative for: End Stage Renal Disease Immunological Medical  History: Negative for: Lupus Erythematosus; Raynaudos; Scleroderma Musculoskeletal Complaints  and Symptoms: No Complaints or Symptoms Medical History: Positive for: Osteoarthritis - back Negative for: Gout; Rheumatoid Arthritis; Osteomyelitis Neurologic Complaints and Symptoms: No Complaints or Symptoms Medical History: Negative for: Dementia; Neuropathy; Quadriplegia; Paraplegia; Seizure Disorder Oncologic Complaints and Symptoms: No Complaints or Symptoms Medical History: Positive for: Received Chemotherapy Negative for: Received Radiation Psychiatric Complaints and Symptoms: No Complaints or Symptoms Medical History: Negative for: Anorexia/bulimia HBO Extended History Items Fuente, Fransisco L. ([id number]) Ear/Nose/Mouth/Throat: Eyes: Chronic sinus problems/congestion Cataracts Family and Social History Cancer: No; Diabetes: Yes - Father; Heart Disease: Yes - Mother; Hypertension: No; Kidney Disease: No; Lung Disease: No; Seizures: No; Stroke: Yes - Mother, Siblings; Former smoker; Marital Status - Married; Alcohol Use: Never; Drug Use: No History; Caffeine Use: Daily; Advanced Directives: Yes (Not Provided); Patient does not want information on Advanced Directives; Living Will: Yes (Not Provided) Physician Affirmation I have reviewed and agree with the above information. Electronic Signature(s) Signed: 07/05/2015 3:17:10 PM By: Christin Fudge MD, FACS Signed: 07/05/2015 5:30:12 PM By: Gretta Cool RN, BSN, Kim RN, BSN Entered By: Christin Fudge on 07/05/2015 15:17:08 Canadian, [name] ([id number]) -------------------------------------------------------------------------------- SuperBill Details Patient Name: Mckimmy, Itzael L. Date of Service: 07/05/2015 Medical Record Number: [id number] Patient Account Number: [id number] Date of Birth/Sex: August 23, 1923 (82 y.o. 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 Facility Procedures CPT4 Code: 35789784 Description: 99213 - WOUND CARE VISIT-LEV 3 EST PT Modifier: Quantity: 1 Physician Procedures CPT4: Description Modifier Quantity Code 7841282 99204 - WC PHYS LEVEL 4 - NEW PT 1 ICD-10 Description Diagnosis S21.211A Laceration without foreign body of right back wall of thorax without penetration into thoracic cavity, initial encounter 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

## 2015-07-06 NOTE — Progress Notes (Signed)
[name] ([id number]) Visit Report for 07/05/2015 Allergy List Details Patient Name: [name], [name] Date of Service: 07/05/2015 3:00 PM Medical Record Number: [id number] Patient Account Number: [id number] Date of Birth/Sex: 1923/05/04 (82 y.o. Male) Treating RN: [name] Primary Care Physician: [facility], [name] Other Clinician: Referring Physician: [facility], [name] Treating Physician/Extender: [name] in Treatment: 0 Allergies Active Allergies baclofen Bactrim Allergy Notes Electronic Signature(s) Signed: 07/05/2015 5:30:12 PM By: Brandon Cool, RN, BSN, Kim RN, BSN Entered By: [name] on 07/05/2015 15:07:07 [name], [name] ([id number]) -------------------------------------------------------------------------------- Arrival Information Details Patient Name: [name], [name] Date of Service: 07/05/2015 3:00 PM Medical Record Number: [id number] Patient Account Number: [id number] Date of Birth/Sex: 1923-03-06 (82 y.o. Male) Treating RN: [name] Primary Care Physician: [name], [name] Other Clinician: Referring Physician: [facility], [name] Treating Physician/Extender: [name] in Treatment: 0 Visit Information Patient Arrived: Walker Arrival Time: 14:48 Accompanied By: wife Transfer Assistance: None Patient Identification Verified: Yes Secondary Verification Process Completed: Yes Patient Has Alerts: Yes Electronic Signature(s) Signed: 07/05/2015 5:30:12 PM By: Brandon Cool, RN, BSN, Kim RN, BSN Entered By: [name] on 07/05/2015 14:54:02 [name], [name] [place] ([id number]) -------------------------------------------------------------------------------- Clinic Level of Care Assessment Details Patient Name: [name], [name] Date of Service: 07/05/2015 3:00 PM Medical Record Number: [id number] Patient Account Number: [id number] Date of Birth/Sex: 17-Nov-1923 (82 y.o. Male) Treating RN: [name] Primary Care Physician: [facility], [name] Other Clinician: Referring Physician: [facility],  [name] Treating Physician/Extender: [name] in Treatment: 0 Clinic Level of Care Assessment Items TOOL 2 Quantity Score []  - Use when only an EandM is performed on the INITIAL visit 0 ASSESSMENTS - Nursing Assessment / Reassessment X - General Physical Exam (combine w/ comprehensive assessment (listed just 1 20 below) when performed on new pt. evals) X - Comprehensive Assessment (HX, ROS, Risk Assessments, Wounds Hx, etc.) 1 25 ASSESSMENTS - Wound and Skin Assessment / Reassessment []  - Simple Wound Assessment / Reassessment - one wound 0 X - Complex Wound Assessment / Reassessment - multiple wounds 1 5 []  - Dermatologic / Skin Assessment (not related to wound area) 0 ASSESSMENTS - Ostomy and/or Continence Assessment and Care []  - Incontinence Assessment and Management 0 []  - Ostomy Care Assessment and Management (repouching, etc.) 0 PROCESS - Coordination of Care X - Simple Patient / Family Education for ongoing care 1 15 []  - Complex (extensive) Patient / Family Education for ongoing care 0 X - Staff obtains Programmer, systems, Records, Test Results / Process Orders 1 10 []  - Staff telephones HHA, Nursing Homes / Clarify orders / etc 0 []  - Routine Transfer to another Facility (non-emergent condition) 0 []  - Routine Hospital Admission (non-emergent condition) 0 []  - New Admissions / Biomedical engineer / Ordering NPWT, Apligraf, etc. 0 []  - Emergency Hospital Admission (emergent condition) 0 X - Simple Discharge Coordination 1 10 [name], [name] ([id number]) []  - Complex (extensive) Discharge Coordination 0 PROCESS - Special Needs []  - Pediatric / Minor Patient Management 0 []  - Isolation Patient Management 0 []  - Hearing / Language / Visual special needs 0 []  - Assessment of Community assistance (transportation, D/C planning, etc.) 0 []  - Additional assistance / Altered mentation 0 []  - Support Surface(s) Assessment (bed, cushion, seat, etc.) 0 INTERVENTIONS - Wound  Cleansing / Measurement X - Wound Imaging (photographs - any number of wounds) 1 5 []  - Wound Tracing (instead of photographs) 0 X - Simple Wound Measurement - one wound 1 5 []  - Complex Wound Measurement - multiple  wounds 0 X - Simple Wound Cleansing - one wound 1 5 []  - Complex Wound Cleansing - multiple wounds 0 INTERVENTIONS - Wound Dressings []  - Small Wound Dressing one or multiple wounds 0 X - Medium Wound Dressing one or multiple wounds 1 15 []  - Large Wound Dressing one or multiple wounds 0 []  - Application of Medications - injection 0 INTERVENTIONS - Miscellaneous []  - External ear exam 0 []  - Specimen Collection (cultures, biopsies, blood, body fluids, etc.) 0 []  - Specimen(s) / Culture(s) sent or taken to Lab for analysis 0 []  - Patient Transfer (multiple staff / Civil Service fast streamer / Similar devices) 0 []  - Simple Staple / Suture removal (25 or less) 0 []  - Complex Staple / Suture removal (26 or more) 0 [name], [name] ([id number]) []  - Hypo / Hyperglycemic Management (close monitor of Blood Glucose) 0 []  - Ankle / Brachial Index (ABI) - do not check if billed separately 0 Has the patient been seen at the hospital within the last three years: Yes Total Score: 115 Level Of Care: New/Established - Level 3 Electronic Signature(s) Signed: 07/05/2015 5:30:12 PM By: Brandon Cool, RN, BSN, Kim RN, BSN Entered By: [name] on 07/05/2015 15:32:26 [name], [name] ([id number]) -------------------------------------------------------------------------------- Encounter Discharge Information Details Patient Name: [name], [name] Date of Service: 07/05/2015 3:00 PM Medical Record Number: [id number] Patient Account Number: [id number] Date of Birth/Sex: 1924/02/09 (82 y.o. Male) Treating RN: [name] Primary Care Physician: [facility], [name] Other Clinician: Referring Physician: [facility], [name] Treating Physician/Extender: [name] in Treatment: 0 Encounter Discharge Information  Items Discharge Pain Level: 0 Discharge Condition: Stable Ambulatory Status: Walker Discharge Destination: Home Transportation: Private Auto Accompanied By: wife Schedule Follow-up Appointment: Yes Medication Reconciliation completed Yes and provided to Patient/Care Vylette Strubel: Provided on Clinical Summary of Care: 07/05/2015 Form Type Recipient Paper Patient AS Electronic Signature(s) Signed: 07/05/2015 3:36:30 PM By: Ruthine Dose Entered By: Ruthine Dose on 07/05/2015 15:36:29 [name], [name] ([id number]) -------------------------------------------------------------------------------- Multi Wound Chart Details Patient Name: Folson, Oree L. Date of Service: 07/05/2015 3:00 PM Medical Record Number: [id number] Patient Account Number: [id number] Date of Birth/Sex: 18-May-1923 (82 y.o. Male) Treating RN: [name] Primary Care Physician: [facility], [name] Other Clinician: Referring Physician: [facility], [name] Treating Physician/Extender: [name] in Treatment: 0 Vital Signs Height(in): 70 Pulse(bpm): 73 Weight(lbs): 203 Blood Pressure 126/60 (mmHg): Body Mass Index(BMI): 29 Temperature(F): 98.1 Respiratory Rate 18 (breaths/min): Photos: [1:No Photos] [2:No Photos] [N/A:N/A] Wound Location: [1:Right Scapula] [2:Back - Midline] [N/A:N/A] Wounding Event: [1:Trauma] [2:Trauma] [N/A:N/A] Primary Etiology: [1:Trauma, Other] [2:Trauma, Other] [N/A:N/A] Date Acquired: [1:06/23/2015] [2:06/23/2015] [N/A:N/A] Weeks of Treatment: [1:0] [2:0] [N/A:N/A] Wound Status: [1:Open] [2:Open] [N/A:N/A] Measurements L x W x D 2.5x3.8x0.1 [2:3x1.8x0.1] [N/A:N/A] (cm) Area (cm) : [1:7.461] [2:4.241] [N/A:N/A] Volume (cm) : [1:0.746] [2:0.424] [N/A:N/A] Classification: [1:Partial Thickness] [2:Full Thickness Without Exposed Support Structures] [N/A:N/A] Exudate Amount: [1:Medium] [2:Medium] [N/A:N/A] Exudate Type: [1:Serosanguineous] [2:Serosanguineous] [N/A:N/A] Exudate Color: [1:red, brown]  [2:red, brown] [N/A:N/A] Wound Margin: [1:Flat and Intact] [2:Flat and Intact] [N/A:N/A] Granulation Amount: [1:Large (67-100%)] [2:Large (67-100%)] [N/A:N/A] Granulation Quality: [1:Red, Friable] [2:Red, Friable] [N/A:N/A] Necrotic Amount: [1:None Present (0%)] [2:N/A] [N/A:N/A] Exposed Structures: [1:Fascia: No Fat: No Tendon: No Muscle: No Joint: No Bone: No Limited to Skin Breakdown] [2:Fascia: No Fat: No Tendon: No Muscle: No Joint: No Bone: No Limited to Skin Breakdown] [N/A:N/A] Epithelialization: [1:None] [2:Small (1-33%)] [N/A:N/A] Periwound Skin Texture: Friable: Yes Friable: Yes N/A Edema: No Edema: No Excoriation: No Excoriation: No Induration: No Induration: No Callus: No Callus: No Crepitus:  No Crepitus: No Fluctuance: No Fluctuance: No Rash: No Rash: No Scarring: No Scarring: No Periwound Skin Maceration: No Moist: Yes N/A Moisture: Moist: No Maceration: No Dry/Scaly: No Dry/Scaly: No Periwound Skin Color: Atrophie Blanche: No Atrophie Blanche: No N/A Cyanosis: No Cyanosis: No Ecchymosis: No Ecchymosis: No Erythema: No Erythema: No Hemosiderin Staining: No Hemosiderin Staining: No Mottled: No Mottled: No Pallor: No Pallor: No Rubor: No Rubor: No Temperature: No Abnormality N/A N/A Tenderness on Yes No N/A Palpation: Wound Preparation: Ulcer Cleansing: Ulcer Cleansing: N/A Rinsed/Irrigated with Rinsed/Irrigated with Saline Saline Topical Anesthetic Topical Anesthetic Applied: None Applied: None Treatment Notes Electronic Signature(s) Signed: 07/05/2015 5:30:12 PM By: Brandon Cool, RN, BSN, Kim RN, BSN Entered By: [name] on 07/05/2015 15:16:05 [place], [place] ([id number]) -------------------------------------------------------------------------------- Multi-Disciplinary Care Plan Details Patient Name: Vandyne, Harun L. Date of Service: 07/05/2015 3:00 PM Medical Record Number: [id number] Patient Account Number: [id number] Date of  Birth/Sex: 1923-06-29 (82 y.o. Male) Treating RN: [name] Primary Care Physician: [facility], [name] Other Clinician: Referring Physician: [facility], [name] Treating Physician/Extender: [name] in Treatment: 0 Active Inactive Abuse / Safety / Falls / Self Care Management Nursing Diagnoses: Impaired physical mobility Potential for falls Goals: Patient will remain injury free Date Initiated: 07/05/2015 Goal Status: Active Interventions: Assess fall risk on admission and as needed Notes: Orientation to the Wound Care Program Nursing Diagnoses: Knowledge deficit related to the wound healing center program Goals: Patient/caregiver will verbalize understanding of the [place] Program Date Initiated: 07/05/2015 Goal Status: Active Interventions: Provide education on orientation to the wound center Notes: Wound/Skin Impairment Nursing Diagnoses: Impaired tissue integrity Goals: Patient/caregiver will verbalize understanding of skin care regimen MARTEZE, MARUCA ([id number]) Date Initiated: 07/05/2015 Goal Status: Active Ulcer/skin breakdown will heal within 14 weeks Date Initiated: 07/05/2015 Goal Status: Active Interventions: Assess ulceration(s) every visit Notes: Electronic Signature(s) Signed: 07/05/2015 5:30:12 PM By: Brandon Cool, RN, BSN, Kim RN, BSN Entered By: [name] on 07/05/2015 15:15:54 [name], [name] ([id number]) -------------------------------------------------------------------------------- Pain Assessment Details Patient Name: [name], [name] Date of Service: 07/05/2015 3:00 PM Medical Record Number: [id number] Patient Account Number: [id number] Date of Birth/Sex: 01/03/1924 (82 y.o. Male) Treating RN: [name] Primary Care Physician: [facility], [name] Other Clinician: Referring Physician: [facility], [name] Treating Physician/Extender: [name] in Treatment: 0 Active Problems Location of Pain Severity and Description of Pain Patient Has Paino  No Site Locations Pain Management and Medication Current Pain Management: Electronic Signature(s) Signed: 07/05/2015 5:30:12 PM By: Brandon Cool, RN, BSN, Kim RN, BSN Entered By: [name] on 07/05/2015 14:54:34 [name], [place]. ([id number]) -------------------------------------------------------------------------------- Patient/Caregiver Education Details Patient Name: [name], [name] Date of Service: 07/05/2015 3:00 PM Medical Record Number: [id number] Patient Account Number: [id number] Date of Birth/Gender: [birth year] (82 y.o. Male) Treating RN: [name] Primary Care Physician: [facility], [name] Other Clinician: Referring Physician: [facility], [name] Treating Physician/Extender: [name] in Treatment: 0 Education Assessment Education Provided To: Patient Education Topics Provided Wound/Skin Impairment: Handouts: Other: wound care as prescribed Methods: Demonstration, Explain/Verbal Responses: State content correctly Electronic Signature(s) Signed: 07/05/2015 5:30:12 PM By: Brandon Cool, RN, BSN, Kim RN, BSN Entered By: [name] on 07/05/2015 15:34:46 [name], [name] ([id number]) -------------------------------------------------------------------------------- Wound Assessment Details Patient Name: [name], [name] Date of Service: 07/05/2015 3:00 PM Medical Record Number: [id number] Patient Account Number: [id number] Date of Birth/Sex: Mar 29, 1923 (82 y.o. Male) Treating RN: [name] Primary Care Physician: [facility], [name] Other Clinician: Referring Physician: [facility], [name] Treating Physician/Extender: [name] in Treatment:  0 Wound Status Wound Number: 1 Primary Etiology: Trauma, Other Wound Location: Right Scapula Wound Status: Open Wounding Event: Trauma Date Acquired: 06/23/2015 Weeks Of Treatment: 0 Clustered Wound: No Photos Photo Uploaded By: [name] on 07/05/2015 16:28:06 Wound Measurements Length: (cm) 2.5 Width: (cm) 3.8 Depth: (cm) 0.1 Area:  (cm) 7.461 Volume: (cm) 0.746 % Reduction in Area: % Reduction in Volume: Epithelialization: None Tunneling: No Undermining: No Wound Description Classification: Partial Thickness Wound Margin: Flat and Intact Exudate Amount: Medium Exudate Type: Serosanguineous Exudate Color: red, brown Foul Odor After Cleansing: No Wound Bed Granulation Amount: Large (67-100%) Exposed Structure Granulation Quality: Red, Friable Fascia Exposed: No Necrotic Amount: None Present (0%) Fat Layer Exposed: No Tendon Exposed: No Louis, Shain L. ([id number]) Muscle Exposed: No Joint Exposed: No Bone Exposed: No Limited to Skin Breakdown Periwound Skin Texture Texture Color No Abnormalities Noted: No No Abnormalities Noted: No Callus: No Atrophie Blanche: No Crepitus: No Cyanosis: No Excoriation: No Ecchymosis: No Fluctuance: No Erythema: No Friable: Yes Hemosiderin Staining: No Induration: No Mottled: No Localized Edema: No Pallor: No Rash: No Rubor: No Scarring: No Temperature / Pain Moisture Temperature: No Abnormality No Abnormalities Noted: No Tenderness on Palpation: Yes Dry / Scaly: No Maceration: No Moist: No Wound Preparation Ulcer Cleansing: Rinsed/Irrigated with Saline Topical Anesthetic Applied: None Treatment Notes Wound #1 (Right Scapula) 1. Cleansed with: Clean wound with Normal Saline 3. Peri-wound Care: Skin Prep 4. Dressing Applied: Other dressing (specify in notes) Notes siltec sorbact Electronic Signature(s) Signed: 07/05/2015 5:30:12 PM By: Brandon Cool, RN, BSN, Kim RN, BSN Entered By: [name] on 07/05/2015 15:04:36 [place], Ozil L. ([id number]) -------------------------------------------------------------------------------- Wound Assessment Details Patient Name: Mcduffee, Marrio L. Date of Service: 07/05/2015 3:00 PM Medical Record Number: [id number] Patient Account Number: [id number] Date of Birth/Sex: 1923/11/27 (82 y.o. Male) Treating RN: [name] Primary Care Physician: [name], [facility] Other Clinician: Referring Physician: [facility], [facility] Treating Physician/Extender: [name] in Treatment: 0 Wound Status Wound Number: 2 Primary Etiology: Trauma, Other Wound Location: Back - Midline Wound Status: Open Wounding Event: Trauma Date Acquired: 06/23/2015 Weeks Of Treatment: 0 Clustered Wound: No Photos Photo Uploaded By: [name] on 07/05/2015 16:27:37 Wound Measurements Length: (cm) 3 Width: (cm) 1.8 Depth: (cm) 0.1 Area: (cm) 4.241 Volume: (cm) 0.424 % Reduction in Area: % Reduction in Volume: Epithelialization: Small (1-33%) Tunneling: No Undermining: No Wound Description Full Thickness Without Exposed Foul Odor Afte Classification: Support Structures Wound Margin: Flat and Intact Exudate Medium Amount: Exudate Type: Serosanguineous Exudate Color: red, brown r Cleansing: No Wound Bed Granulation Amount: Large (67-100%) Exposed Structure Granulation Quality: Red, Friable Fascia Exposed: No Fat Layer Exposed: No Tros[place], Macai L. ([id number]) Tendon Exposed: No Muscle Exposed: No Joint Exposed: No Bone Exposed: No Limited to Skin Breakdown Periwound Skin Texture Texture Color No Abnormalities Noted: No No Abnormalities Noted: No Callus: No Atrophie Blanche: No Crepitus: No Cyanosis: No Excoriation: No Ecchymosis: No Fluctuance: No Erythema: No Friable: Yes Hemosiderin Staining: No Induration: No Mottled: No Localized Edema: No Pallor: No Rash: No Rubor: No Scarring: No Moisture No Abnormalities Noted: No Dry / Scaly: No Maceration: No Moist: Yes Wound Preparation Ulcer Cleansing: Rinsed/Irrigated with Saline Topical Anesthetic Applied: None Treatment Notes Wound #2 (Midline Back) 1. Cleansed with: Clean wound with Normal Saline 3. Peri-wound Care: Skin Prep 4. Dressing Applied: Other dressing (specify in notes) Notes siltec sorbact Electronic  Signature(s) Signed: 07/05/2015 5:30:12 PM By: Brandon Cool, RN, BSN, Kim RN, BSN Entered By: Brandon Cool, RN,  BSN, [name] on 07/05/2015 15:05:37 [place], [place] ([id number]) -------------------------------------------------------------------------------- Vitals Details Patient Name: Dorrough, Alphonse L. Date of Service: 07/05/2015 3:00 PM Medical Record Number: [id number] Patient Account Number: [id number] Date of Birth/Sex: [birth year] (82 y.o. Male) Treating RN: [name] Primary Care Physician: [facility], [place] Other Clinician: Referring Physician: [facility], [facility] Treating Physician/Extender: [name] in Treatment: 0 Vital Signs Time Taken: 14:54 Temperature (F): 98.1 Height (in): 70 Pulse (bpm): 73 Source: Measured Respiratory Rate (breaths/min): 18 Weight (lbs): 203 Blood Pressure (mmHg): 126/60 Source: Measured Reference Range: 80 - 120 mg / dl Body Mass Index (BMI): 29.1 Electronic Signature(s) Signed: 07/05/2015 5:30:12 PM By: Brandon Cool, RN, BSN, Kim RN, BSN Entered By: [name] on 07/05/2015 14:55:32

## 2015-07-06 NOTE — Progress Notes (Signed)
Brandon, [name] ([id number]) Visit Report for 07/05/2015 Abuse/Suicide Risk Screen Details Patient Name: [name], [name] Date of Service: 07/05/2015 3:00 PM Medical Record Number: [id number] Patient Account Number: [id number] Date of Birth/Sex: [birth year] (82 y.o. Male) Treating RN: Cornell Barman Primary Care Physician: [facility], [facility] Other Clinician: Referring Physician: [facility], [place] Treating Physician/Extender: Frann Rider in Treatment: 0 Abuse/Suicide Risk Screen Items Answer ABUSE/SUICIDE RISK SCREEN: Has anyone close to you tried to hurt or harm you recentlyo No Do you feel uncomfortable with anyone in your familyo No Has anyone forced you do things that you didnot want to doo No Do you have any thoughts of harming yourselfo No Patient displays signs or symptoms of abuse and/or neglect. No Electronic Signature(s) Signed: 07/05/2015 5:30:12 PM By: Gretta Cool RN, BSN, Kim RN, BSN Entered By: Gretta Cool, RN, BSN, Kim on 07/05/2015 15:13:32 [place], Shaheem LMarland Kitchen ([id number]) -------------------------------------------------------------------------------- Activities of Daily Living Details Patient Name: [name], [name] Date of Service: 07/05/2015 3:00 PM Medical Record Number: [id number] Patient Account Number: [id number] Date of Birth/Sex: 1923-03-01 (82 y.o. Male) Treating RN: Cornell Barman Primary Care Physician: [facility], [place] Other Clinician: Referring Physician: [facility], [place] Treating Physician/Extender: Frann Rider in Treatment: 0 Activities of Daily Living Items Answer Activities of Daily Living (Please select one for each item) Drive Automobile Completely Able Take Medications Completely Able Use Telephone Completely Able Care for Appearance Completely Able Use Toilet Completely Able Bath / Shower Completely Able Dress Self Completely Able Feed Self Completely Able Walk Completely Able Get In / Out Bed Completely Able Housework Completely Able Prepare Meals Completely [place] for Self Completely Able Electronic Signature(s) Signed: 07/05/2015 5:30:12 PM By: Gretta Cool, RN, BSN, Kim RN, BSN Entered By: Gretta Cool, RN, BSN, Kim on 07/05/2015 15:13:46 [place], [place]. ([id number]) -------------------------------------------------------------------------------- Education Assessment Details Patient Name: [name], [name] Date of Service: 07/05/2015 3:00 PM Medical Record Number: [id number] Patient Account Number: [id number] Date of Birth/Sex: [birth year] (82 y.o. Male) Treating RN: Cornell Barman Primary Care Physician: [facility], [place] Other Clinician: Referring Physician: [facility], [place] Der Treating Physician/Extender: Frann Rider in Treatment: 0 Primary Learner Assessed: Patient Learning Preferences/Education Level/Primary Language Learning Preference: Explanation, Demonstration Highest Education Level: High School Preferred Language: English Cognitive Barrier Assessment/Beliefs Language Barrier: No Translator Needed: No Memory Deficit: No Emotional Barrier: No Cultural/Religious Beliefs Affecting Medical No Care: Physical Barrier Assessment Impaired Vision: Yes Glasses Impaired Hearing: Yes Hearing Aid Knowledge/Comprehension Assessment Knowledge Level: High Comprehension Level: High Ability to understand written High instructions: Ability to understand verbal High instructions: Motivation Assessment Anxiety Level: Calm Cooperation: Cooperative Education Importance: Acknowledges Need Interest in Health Problems: Asks Questions Perception: Coherent Willingness to Engage in Self- High Management Activities: Readiness to Engage in Self- High Management Activities: Electronic Signature(s) Signed: 07/05/2015 5:30:12 PM By: Gretta Cool, RN, BSN, Kim RN, BSN [place], Kayven Carlean Jews ([id number]) Entered By: Gretta Cool RN, BSN, Kim on 07/05/2015 15:14:18 [place], Aksh Carlean Jews ([id number]) -------------------------------------------------------------------------------- Fall  Risk Assessment Details Patient Name: [name], [name] Date of Service: 07/05/2015 3:00 PM Medical Record Number: [id number] Patient Account Number: [id number] Date of Birth/Sex: [birth year] (82 y.o. Male) Treating RN: Cornell Barman Primary Care Physician: [facility], [facility] Other Clinician: Referring Physician: [facility], [place] Der Treating Physician/Extender: Frann Rider in Treatment: 0 Fall Risk Assessment Items Have you had 2 or more falls in the last 12 monthso 0 No Have you had any fall that resulted in injury in the last 12 monthso 0 No FALL RISK ASSESSMENT: History of falling - immediate or within 3 months  25 Yes Secondary diagnosis 0 No Ambulatory aid None/bed rest/wheelchair/nurse 0 No Crutches/cane/walker 15 Yes Furniture 0 No IV Access/Saline Lock 0 No Gait/Training Normal/bed rest/immobile 0 Yes Weak 0 No Impaired 0 No Mental Status Oriented to own ability 0 Yes Electronic Signature(s) Signed: 07/05/2015 5:30:12 PM By: Gretta Cool, RN, BSN, Kim RN, BSN Entered By: Gretta Cool, RN, BSN, Kim on 07/05/2015 15:14:38 [place], Eion L. ([id number]) -------------------------------------------------------------------------------- Nutrition Risk Assessment Details Patient Name: [name], [name] Date of Service: 07/05/2015 3:00 PM Medical Record Number: [id number] Patient Account Number: [id number] Date of Birth/Sex: 21-Oct-1923 (82 y.o. Male) Treating RN: Cornell Barman Primary Care Physician: [facility], [place] Other Clinician: Referring Physician: [facility], [place] Treating Physician/Extender: Frann Rider in Treatment: 0 Height (in): 70 Weight (lbs): 203 Body Mass Index (BMI): 29.1 Nutrition Risk Assessment Items NUTRITION RISK SCREEN: I have an illness or condition that made me change the kind and/or 0 No amount of food I eat I eat fewer than two meals per day 0 No I eat few fruits and vegetables, or milk products 0 No I have three or more drinks of beer, liquor or wine almost every day 0 No I have tooth or  mouth problems that make it hard for me to eat 0 No I don't always have enough money to buy the food I need 0 No I eat alone most of the time 0 No I take three or more different prescribed or over-the-counter drugs a 1 Yes day Without wanting to, I have lost or gained 10 pounds in the last six 0 No months I am not always physically able to shop, cook and/or feed myself 0 No Nutrition Protocols Good Risk Protocol 0 No interventions needed Moderate Risk Protocol Electronic Signature(s) Signed: 07/05/2015 5:30:12 PM By: Gretta Cool, RN, BSN, Kim RN, BSN Entered By: Gretta Cool, RN, BSN, Kim on 07/05/2015 15:14:51

## 2015-07-09 ENCOUNTER — Telehealth: Payer: Self-pay | Admitting: Family Medicine

## 2015-07-09 NOTE — Telephone Encounter (Signed)
ERRENOUS °

## 2015-07-12 ENCOUNTER — Encounter: Payer: Medicare Other | Admitting: Surgery

## 2015-07-12 DIAGNOSIS — S21211A Laceration without foreign body of right back wall of thorax without penetration into thoracic cavity, initial encounter: Secondary | ICD-10-CM | POA: Diagnosis not present

## 2015-07-12 NOTE — Progress Notes (Addendum)
[name] ([id number]) Visit Report for 07/12/2015 Chief Complaint Document Details Patient Name: [name], [name]. Date of Service: 07/12/2015 3:45 PM Medical Record Number: [id number] Patient Account Number: [id number] Date of Birth/Sex: 06-10-1923 (82 y.o. Male) Treating RN: [name] Primary Care Physician: [name], [name] Other Clinician: Referring Physician: [facility] [name], [name] Treating Physician/Extender: [name] in Treatment: 1 Information Obtained from: Patient Chief Complaint Patient presents to the wound care [name] for a consult due non healing wound to his left and right upper back which she's had for about 2 weeks. Electronic Signature(s) Signed: 07/12/2015 3:24:30 PM By: Brandon Fudge MD, FACS Entered By: [name] on 07/12/2015 15:24:30 [place], [name]. ([id number]) -------------------------------------------------------------------------------- HPI Details Patient Name: [name], [name]. Date of Service: 07/12/2015 3:45 PM Medical Record Number: [id number] Patient Account Number: [id number] Date of Birth/Sex: December 11, 1923 (82 y.o. Male) Treating RN: [name] Primary Care Physician: [name], [name] Other Clinician: Referring Physician: [facility] [name], [name] Treating Physician/Extender: [name] in Treatment: 1 History of Present Illness Location: left and right upper back Quality: Patient reports experiencing a dull pain to affected area(s). Severity: Patient states wound (s) are getting better. Duration: Patient has had the wound for < 2 weeks prior to presenting for treatment Timing: Pain in wound is Intermittent (comes and goes Context: The wound occurred when the patient had a fall and hurt his back Modifying Factors: Other treatment(s) tried include:local care with Neosporin ointment Associated Signs and Symptoms: Patient reports having:scar tissue opens out every time they do dressing HPI Description: 81 year old patient who was sent to [place] by her PCPs office when he  sustained a fall and landed on his right hand and received a laceration of the right hand third and fourth digit and the interdigital space and this was sutured in the ER. He also had an unrelated injury when he hurt his back after different fall. the hand is healed but the back continues to be having an open wound. Past medical history significant for carotid stenosis, [name], [name], dyslipidemia, hypertension, anxiety, GERD, multiple myeloma, BPH, inguinal hernia and is status post appendectomy, hip fracture, cardiac catheterization. He smoked in the remote past and has not smoked for several years. Electronic Signature(s) Signed: 07/12/2015 3:24:33 PM By: Brandon Fudge MD, FACS Entered By: [name] on 07/12/2015 15:24:33 [place], Brandon LMarland Kitchen ([id number]) -------------------------------------------------------------------------------- Physical Exam Details Patient Name: [name], [name]. Date of Service: 07/12/2015 3:45 PM Medical Record Number: [id number] Patient Account Number: [id number] Date of Birth/Sex: Jul 24, 1923 (82 y.o. Male) Treating RN: [name] Primary Care Physician: [name], [name] Other Clinician: Referring Physician: [facility] [name], [name] Treating Physician/Extender: [name] in Treatment: 1 Constitutional . Pulse regular. Respirations normal and unlabored. Afebrile. . Eyes Nonicteric. Reactive to light. Ears, Nose, Mouth, and Throat Lips, teeth, and gums WNL.Marland Kitchen Moist mucosa without lesions. Neck supple and nontender. No palpable supraclavicular or cervical adenopathy. Normal sized without goiter. Respiratory WNL. No retractions.. Cardiovascular Pedal Pulses WNL. No clubbing, cyanosis or edema. Lymphatic No adneopathy. No adenopathy. No adenopathy. Musculoskeletal Adexa without tenderness or enlargement.. Digits and nails w/o clubbing, cyanosis, infection, petechiae, ischemia, or inflammatory conditions.. Integumentary (Hair, Skin) No suspicious lesions. No crepitus  or fluctuance. No peri-wound warmth or erythema. No masses.Marland Kitchen Psychiatric Judgement and insight Intact.. No evidence of depression, anxiety, or agitation.. Notes the 2 superficial lacerated wounds on either side of the back have almost completely healed except for one tiny area on the left side. Aside some tape burns under the border adhesive. Electronic Signature(s) Signed: 07/12/2015  3:25:16 PM By: Brandon Fudge MD, FACS Entered By: [name] on 07/12/2015 15:25:16 [place], [place]. ([id number]) -------------------------------------------------------------------------------- Physician Orders Details Patient Name: [name], [name]. Date of Service: 07/12/2015 3:45 PM Medical Record Number: [id number] Patient Account Number: [id number] Date of Birth/Sex: July 24, 1923 (82 y.o. Male) Treating RN: [name] Primary Care Physician: [name], [name] Other Clinician: Referring Physician: 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 Secondary Dressing Wound #2 Midline Back o Boardered Foam Dressing Dressing Change Frequency Wound #2 Midline Back o Change dressing every week Follow-up Appointments Wound #2 Midline Back o Return Appointment in 1 week. Electronic Signature(s) Signed: 07/12/2015 4:01:52 PM By: Brandon Fudge MD, FACS Signed: 07/12/2015 4:39:44 PM By: Gretta Cool RN, BSN, Kim RN, BSN Entered By: Gretta Cool, RN, BSN, Kim on 07/12/2015  84:66:59 [place], [place] ([id number]) -------------------------------------------------------------------------------- Problem List Details Patient Name: [name], [name]. Date of Service: 07/12/2015 3:45 PM Medical Record Number: [id number] Patient Account Number: [id number] Date of Birth/Sex: 1923/12/02 (82 y.o. Male) Treating RN: [name] Primary Care Physician: [name], [name] Other Clinician: Referring Physician: [name], [name] Treating Physician/Extender: [name] in Treatment: 1 Active Problems ICD-10 Encounter Code Description Active Date Diagnosis S21.211A Laceration without foreign body of right back wall of thorax 07/05/2015 Yes without penetration into thoracic cavity, initial encounter S21.212A Laceration without foreign body of left back wall of thorax 07/05/2015 Yes without penetration into thoracic cavity, initial encounter 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 ([id number]) -------------------------------------------------------------------------------- Progress Note Details Patient Name: [name], [name]. Date of Service: 07/12/2015 3:45 PM Medical Record Number: [id number] Patient Account Number: [id number] Date of Birth/Sex: Sep 08, 1923 (82 y.o. Male) Treating RN: [name] Primary Care Physician: [name], [name] Other Clinician: Referring Physician: [name], [name] Treating Physician/Extender: [name] in Treatment: 1 Subjective Chief Complaint Information obtained from Patient Patient presents to the wound care [name] for a consult due non healing wound to his left and right upper back which she's had for about 2 weeks. History of Present Illness (HPI) The following HPI elements were documented for the patient's wound: Location: left  and right upper back Quality: Patient reports experiencing a dull pain to affected area(s). Severity: Patient states wound (s) are getting better. Duration: Patient has had the wound for < 2 weeks prior to presenting for treatment Timing: Pain in wound is Intermittent (comes and goes Context: The wound occurred when the patient had a fall and hurt his back Modifying Factors: Other treatment(s) tried include:local care with Neosporin ointment Associated Signs and Symptoms: Patient reports having:scar tissue opens out every time they do dressing 82 year old patient who was sent to [place] by her PCPs office when he sustained a fall and landed on his right hand and received a laceration of the right hand third and fourth digit and the interdigital space and this was sutured in the ER. He also had an  unrelated injury when he hurt his back after different fall. the hand is healed but the back continues to be having an open wound. Past medical history significant for carotid stenosis, [name], [name], dyslipidemia, hypertension, anxiety, GERD, multiple myeloma, BPH, inguinal hernia and is status post appendectomy, hip fracture, cardiac catheterization. He smoked in the remote past and has not smoked for several years. Objective Constitutional Pulse regular. Respirations normal and unlabored. Afebrile. Vitals Time Taken: 3:10 PM, Height: 70 in, Weight: 203 lbs, BMI: 29.1, Temperature: 97.8 F, Pulse: 79 bpm, Respiratory Rate: 18 breaths/min, Blood Pressure: 136/45 mmHg. [place], Cohan [name]. ([id number]) Eyes Nonicteric. Reactive to light. Ears, Nose, Mouth, and Throat Lips, teeth, and gums WNL.Marland Kitchen Moist mucosa without lesions. Neck supple and nontender. No palpable supraclavicular or cervical adenopathy. Normal sized without goiter. Respiratory WNL. No retractions.. Cardiovascular Pedal Pulses WNL. No clubbing, cyanosis or edema. Lymphatic No adneopathy. No adenopathy. No  adenopathy. Musculoskeletal Adexa without tenderness or enlargement.. Digits and nails w/o clubbing, cyanosis, infection, petechiae, ischemia, or inflammatory conditions.Marland Kitchen Psychiatric Judgement and insight Intact.. No evidence of depression, anxiety, or agitation.. General Notes: the 2 superficial lacerated wounds on either side of the back have almost completely healed except for one tiny area on the left side. Aside some tape burns under the border adhesive. Integumentary (Hair, Skin) No suspicious lesions. No crepitus or fluctuance. No peri-wound warmth or erythema. No masses.. Wound #1 status is Healed - Epithelialized. Original cause of wound was Trauma. The wound is located on the Right Scapula. The wound measures 0cm length x 0cm width x 0cm depth; 0cm^2 area and 0cm^3 volume. The wound is limited to skin breakdown. There is a medium amount of serosanguineous drainage noted. The wound margin is flat and intact. There is large (67-100%) red, friable granulation within the wound bed. There is no necrotic tissue within the wound bed. The periwound skin appearance exhibited: Friable. The periwound skin appearance did not exhibit: Callus, Crepitus, Excoriation, Fluctuance, Induration, Localized Edema, Rash, Scarring, Dry/Scaly, Maceration, Moist, Atrophie Blanche, Cyanosis, Ecchymosis, Hemosiderin Staining, Mottled, Pallor, Rubor, Erythema. Periwound temperature was noted as No Abnormality. The periwound has tenderness on palpation. Wound #2 status is Open. Original cause of wound was Trauma. The wound is located on the Midline Back. The wound measures 0.7cm length x 0.5cm width x 0.1cm depth; 0.275cm^2 area and 0.027cm^3 volume. Assessment [name], [name]. 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 The open blister areas will be covered with a antibacterial ointment and left open. He will come back to see as next week and I'm despite discharged by then Plan Wound Cleansing: Wound #2 Midline Back: Clean wound with Normal Saline. Secondary Dressing: Wound #2 Midline Back: Boardered Foam Dressing Dressing Change Frequency: Wound #2 Midline Back: Change dressing every week Follow-up Appointments: Wound #2 Midline Back: Return Appointment in 1 week. I have recommended an Allevyn border to be applied over the left upper back wound. The open blister areas will be covered with a antibacterial ointment and left open. He will come back to see as next week and I'm despite discharged by then Electronic Signature(s) Signed: 07/12/2015 4:04:19 PM By:  Brandon Fudge MD, FACS Previous Signature: 07/12/2015 3:25:52 PM Version By: Brandon Fudge MD, FACS [place], [name] ([id number]) Entered By: [name] on 07/12/2015 16:04:19 [name], [name]. ([id number]) -------------------------------------------------------------------------------- SuperBill Details Patient Name: [name], [name]. Date of Service: 07/12/2015 Medical Record Number: [id number] Patient Account Number: [id number] Date of Birth/Sex: 1923/08/24 (82 y.o. 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 Facility Procedures CPT4 Code: [id number] Description: [phone] - WOUND CARE VISIT-LEV 2 EST PT Modifier: Quantity: 1 Physician Procedures CPT4: Description Modifier Quantity Code [id number] - WC PHYS LEVEL 3 - EST PT 1 ICD-10 Description Diagnosis S21.211A Laceration without foreign body of right back wall of thorax without penetration into thoracic cavity, initial encounter S21.212A  Laceration without foreign body of left back wall of thorax without penetration into thoracic cavity, initial encounter 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

## 2015-07-13 NOTE — Progress Notes (Signed)
DELAINE, SAUBER ([id number]) Visit Report for 07/12/2015 Arrival Information Details Patient Name: [name], [name] Date of Service: 07/12/2015 3:45 PM Medical Record Number: [id number] Patient Account Number: [id number] Date of Birth/Sex: 1923-09-12 (82 y.o. Male) Treating RN: [name] Primary Care Physician: [name] Other Clinician: Referring Physician: [facility], [name] Treating Physician/Extender: [name] in Treatment: 1 Visit Information History Since Last Visit Added or deleted any medications: No Patient Arrived: [name] Any new allergies or adverse reactions: No Arrival Time: 15:10 Had a fall or experienced change in No Accompanied By: wife activities of daily living that may affect Transfer Assistance: None risk of falls: Patient Identification Verified: Yes Signs or symptoms of abuse/neglect since last No Secondary Verification Process Completed: Yes visito Patient Has Alerts: Yes Hospitalized since last visit: No Has Dressing in Place as Prescribed: Yes Pain Present Now: No Electronic Signature(s) Signed: 07/12/2015 4:39:44 PM By: [name] Entered By: Brandon Cool, RN, [name], Kim on 07/12/2015 15:10:25 [place], [place]. ([id number]) -------------------------------------------------------------------------------- Clinic Level of Care Assessment Details Patient Name: [name], [name] Date of Service: 07/12/2015 3:45 PM Medical Record Number: [id number] Patient Account Number: [id number] Date of Birth/Sex: 11-21-1923 (82 y.o. Male) Treating RN: [name] Primary Care Physician: [name] Other Clinician: Referring Physician: [facility], [name] Treating Physician/Extender: [name] in Treatment: 1 Clinic Level of Care Assessment Items TOOL 4 Quantity Score []  - Use when only an EandM is performed on FOLLOW-UP visit 0 ASSESSMENTS - Nursing Assessment / Reassessment []  - Reassessment of Co-morbidities (includes updates in patient status) 0 X - Reassessment  of Adherence to Treatment Plan 1 5 ASSESSMENTS - Wound and Skin Assessment / Reassessment X - Simple Wound Assessment / Reassessment - one wound 1 5 []  - Complex Wound Assessment / Reassessment - multiple wounds 0 []  - Dermatologic / Skin Assessment (not related to wound area) 0 ASSESSMENTS - Focused Assessment []  - Circumferential Edema Measurements - multi extremities 0 []  - Nutritional Assessment / Counseling / Intervention 0 []  - Lower Extremity Assessment (monofilament, tuning fork, pulses) 0 []  - Peripheral Arterial Disease Assessment (using hand held doppler) 0 ASSESSMENTS - Ostomy and/or Continence Assessment and Care []  - Incontinence Assessment and Management 0 []  - Ostomy Care Assessment and Management (repouching, etc.) 0 PROCESS - Coordination of Care X - Simple Patient / Family Education for ongoing care 1 15 []  - Complex (extensive) Patient / Family Education for ongoing care 0 []  - Staff obtains Programmer, systems, Records, Test Results / Process Orders 0 []  - Staff telephones HHA, Nursing Homes / Clarify orders / etc 0 []  - Routine Transfer to another Facility (non-emergent condition) 0 [name], [name] ([id number]) []  - Routine Hospital Admission (non-emergent condition) 0 []  - New Admissions / Biomedical engineer / Ordering NPWT, Apligraf, etc. 0 []  - Emergency Hospital Admission (emergent condition) 0 X - Simple Discharge Coordination 1 10 []  - Complex (extensive) Discharge Coordination 0 PROCESS - Special Needs []  - Pediatric / Minor Patient Management 0 []  - Isolation Patient Management 0 []  - Hearing / Language / Visual special needs 0 []  - Assessment of Community assistance (transportation, D/C planning, etc.) 0 []  - Additional assistance / Altered mentation 0 []  - Support Surface(s) Assessment (bed, cushion, seat, etc.) 0 INTERVENTIONS - Wound Cleansing / Measurement X - Simple Wound Cleansing - one wound 1 5 []  - Complex Wound Cleansing - multiple wounds 0 X -  Wound Imaging (photographs - any number of wounds) 1 5 []  - Wound  Tracing (instead of photographs) 0 X - Simple Wound Measurement - one wound 1 5 []  - Complex Wound Measurement - multiple wounds 0 INTERVENTIONS - Wound Dressings X - Small Wound Dressing one or multiple wounds 1 10 []  - Medium Wound Dressing one or multiple wounds 0 []  - Large Wound Dressing one or multiple wounds 0 []  - Application of Medications - topical 0 []  - Application of Medications - injection 0 INTERVENTIONS - Miscellaneous []  - External ear exam 0 [name], [name] ([id number]) []  - Specimen Collection (cultures, biopsies, blood, body fluids, etc.) 0 []  - Specimen(s) / Culture(s) sent or taken to Lab for analysis 0 []  - Patient Transfer (multiple staff / Harrel Lemon Lift / Similar devices) 0 []  - Simple Staple / Suture removal (25 or less) 0 []  - Complex Staple / Suture removal (26 or more) 0 []  - Hypo / Hyperglycemic Management (close monitor of Blood Glucose) 0 []  - Ankle / Brachial Index (ABI) - do not check if billed separately 0 X - Vital Signs 1 5 Has the patient been seen at the hospital within the last three years: Yes Total Score: 65 Level Of Care: New/Established - Level 2 Electronic Signature(s) Signed: 07/12/2015 4:39:44 PM By: [name] Entered By: Brandon Cool, RN, [name], Kim on 07/12/2015 15:28:53 [name], [name] ([id number]) -------------------------------------------------------------------------------- Encounter Discharge Information Details Patient Name: [name], [name] Date of Service: 07/12/2015 3:45 PM Medical Record Number: [id number] Patient Account Number: [id number] Date of Birth/Sex: 1924-02-01 (82 y.o. Male) Treating RN: [name] Primary Care Physician: [name] Other Clinician: Referring Physician: [facility], [name] Treating Physician/Extender: [name] in Treatment: 1 Encounter Discharge Information Items Discharge Pain Level: 0 Discharge Condition: Stable Ambulatory  Status: Walker Discharge Destination: Home Transportation: Private Auto Accompanied By: wife Schedule Follow-up Appointment: Yes Medication Reconciliation completed Yes and provided to Patient/Care Kenneith Stief: Provided on Clinical Summary of Care: 07/12/2015 Form Type Recipient Paper Patient AS Electronic Signature(s) Signed: 07/12/2015 4:39:44 PM By: Brandon Cool RN, [name], Kim RN, [name] Previous Signature: 07/12/2015 3:29:19 PM Version By: Ruthine Dose Entered By: Brandon Cool RN, [name], Kim on 07/12/2015 15:29:41 [place], [place]. ([id number]) -------------------------------------------------------------------------------- Multi Wound Chart Details Patient Name: [name], [name] Date of Service: 07/12/2015 3:45 PM Medical Record Number: [id number] Patient Account Number: [id number] Date of Birth/Sex: Apr 25, 1923 (82 y.o. Male) Treating RN: [name] Primary Care Physician: [facility], [name] Other Clinician: Referring Physician: [facility], [name] Treating Physician/Extender: [name] in Treatment: 1 Vital Signs Height(in): 70 Pulse(bpm): 79 Weight(lbs): 203 Blood Pressure 136/45 (mmHg): Body Mass Index(BMI): 29 Temperature(F): 97.8 Respiratory Rate 18 (breaths/min): Photos: [1:No Photos] [2:No Photos] [N/A:N/A] Wound Location: [1:Right Scapula] [2:Midline Back] [N/A:N/A] Wounding Event: [1:Trauma] [2:Trauma] [N/A:N/A] Primary Etiology: [1:Trauma, Other] [2:Trauma, Other] [N/A:N/A] Comorbid History: [1:Cataracts, Chronic sinus problems/congestion, Sleep Apnea, Coronary Artery Disease, Osteoarthritis, Received Chemotherapy] [2:N/A] [N/A:N/A] Date Acquired: [1:06/23/2015] [2:06/23/2015] [N/A:N/A] Weeks of Treatment: [1:1] [2:1] [N/A:N/A] Wound Status: [1:Healed - Epithelialized] [2:Open] [N/A:N/A] Measurements L x W x D 0x0x0 [2:0.7x0.5x0.1] [N/A:N/A] (cm) Area (cm) : [1:0] [2:0.275] [N/A:N/A] Volume (cm) : [1:0] [2:0.027] [N/A:N/A] % Reduction in Area: [1:100.00%] [2:93.50%] [N/A:N/A] %  Reduction in Volume: 100.00% [2:93.60%] [N/A:N/A] Classification: [1:Partial Thickness] [2:Full Thickness Without Exposed Support Structures] [N/A:N/A] Exudate Amount: [1:Medium] [2:N/A] [N/A:N/A] Exudate Type: [1:Serosanguineous] [2:N/A] [N/A:N/A] Exudate Color: [1:red, brown] [2:N/A] [N/A:N/A] Wound Margin: [1:Flat and Intact] [2:N/A] [N/A:N/A] Granulation Amount: [1:Large (67-100%)] [2:N/A] [N/A:N/A] Granulation Quality: [1:Red, Friable] [2:N/A] [N/A:N/A] Necrotic Amount: [1:None Present (0%)] [2:N/A] [N/A:N/A] Exposed Structures: [2:N/A] [N/A:N/A] Fascia: No Fat:  No Tendon: No Muscle: No Joint: No Bone: No Limited to Skin Breakdown Epithelialization: None N/A N/A Periwound Skin Texture: Friable: Yes No Abnormalities Noted N/A Edema: No Excoriation: No Induration: No Callus: No Crepitus: No Fluctuance: No Rash: No Scarring: No Periwound Skin Maceration: No No Abnormalities Noted N/A Moisture: Moist: No Dry/Scaly: No Periwound Skin Color: Atrophie Blanche: No No Abnormalities Noted N/A Cyanosis: No Ecchymosis: No Erythema: No Hemosiderin Staining: No Mottled: No Pallor: No Rubor: No Temperature: No Abnormality N/A N/A Tenderness on Yes No N/A Palpation: Wound Preparation: Ulcer Cleansing: N/A N/A Rinsed/Irrigated with Saline Topical Anesthetic Applied: None Treatment Notes Electronic Signature(s) Signed: 07/12/2015 4:39:44 PM By: [name] Entered By: Brandon Cool, RN, [name], Kim on 07/12/2015 15:26:17 [place], [place] ([id number]) -------------------------------------------------------------------------------- Multi-Disciplinary Care Plan Details Patient Name: [name], [name] Date of Service: 07/12/2015 3:45 PM Medical Record Number: [id number] Patient Account Number: [id number] Date of Birth/Sex: Jul 19, 1923 (82 y.o. Male) Treating RN: [name] Primary Care Physician: [facility], [name] Other Clinician: Referring Physician: [facility], [name] Treating  Physician/Extender: [name] in Treatment: 1 Active Inactive Abuse / Safety / Falls / Self Care Management Nursing Diagnoses: Impaired physical mobility Potential for falls Goals: Patient will remain injury free Date Initiated: 07/05/2015 Goal Status: Active Interventions: Assess fall risk on admission and as needed Notes: Orientation to the Wound Care Program Nursing Diagnoses: Knowledge deficit related to the wound healing center program Goals: Patient/caregiver will verbalize understanding of the [place] [facility] Date Initiated: 07/05/2015 Goal Status: Active Interventions: Provide education on orientation to the wound center Notes: Wound/Skin Impairment Nursing Diagnoses: Impaired tissue integrity Goals: Patient/caregiver will verbalize understanding of skin care regimen KYANDRE, CHLUDZINSKI ([id number]) Date Initiated: 07/05/2015 Goal Status: Active Ulcer/skin breakdown will heal within 14 weeks Date Initiated: 07/05/2015 Goal Status: Active Interventions: Assess ulceration(s) every visit Notes: Electronic Signature(s) Signed: 07/12/2015 4:39:44 PM By: [name] Entered By: Brandon Cool, RN, [name], Kim on 07/12/2015 15:26:09 Kaylor, Keylon L. ([id number]) -------------------------------------------------------------------------------- Pain Assessment Details Patient Name: [name], [name] Date of Service: 07/12/2015 3:45 PM Medical Record Number: [id number] Patient Account Number: [id number] Date of Birth/Sex: Nov 09, 1923 (82 y.o. Male) Treating RN: [name] Primary Care Physician: [facility], [name] Other Clinician: Referring Physician: [facility], [name] Treating Physician/Extender: [name] in Treatment: 1 Active Problems Location of Pain Severity and Description of Pain Patient Has Paino No Site Locations With Dressing Change: No Pain Management and Medication Current Pain Management: Electronic Signature(s) Signed: 07/12/2015 4:39:44 PM By:  [name] Entered By: Brandon Cool, RN, [name], Kim on 07/12/2015 15:10:32 [name], [name] ([id number]) -------------------------------------------------------------------------------- Patient/Caregiver Education Details Patient Name: [name], [name] Date of Service: 07/12/2015 3:45 PM Medical Record Number: [id number] Patient Account Number: [id number] Date of Birth/Gender: October 09, 1923 (82 y.o. Male) Treating RN: [name] Primary Care Physician: [facility], [name] Other Clinician: Referring Physician: [facility], [name] Treating Physician/Extender: [name] in Treatment: 1 Education Assessment Education Provided To: Patient Education Topics Provided Wound/Skin Impairment: Handouts: Caring for Your Ulcer Methods: Demonstration Responses: State content correctly Electronic Signature(s) Signed: 07/12/2015 4:39:44 PM By: [name] Entered By: Brandon Cool, RN, [name], Kim on 07/12/2015 15:29:51 [place], [place] ([id number]) -------------------------------------------------------------------------------- Wound Assessment Details Patient Name: [name], [name] Date of Service: 07/12/2015 3:45 PM Medical Record Number: [id number] Patient Account Number: [id number] Date of Birth/Sex: 1923-12-26 (82 y.o. Male) Treating RN: [name] Primary Care Physician: [facility], [name] Other Clinician: Referring Physician: [facility], [name] Treating Physician/Extender: Christin Fudge  Weeks in Treatment: 1 Wound Status Wound Number: 1 Primary Trauma, Other Etiology: Wound Location: Right Scapula Wound Healed - Epithelialized Wounding Event: Trauma Status: Date Acquired: 06/23/2015 Comorbid Cataracts, Chronic sinus Weeks Of Treatment: 1 History: problems/congestion, Sleep Apnea, Clustered Wound: No Coronary Artery Disease, Osteoarthritis, Received Chemotherapy Photos Photo Uploaded By: Brandon Cool, RN, [name], Kim on 07/12/2015 16:28:46 Wound Measurements Length: (cm) 0 % Reduction in Width: (cm) 0 % Reduction  in Depth: (cm) 0 Epithelializati Area: (cm) 0 Volume: (cm) 0 Area: 100% Volume: 100% on: None Wound Description Classification: Partial Thickness Foul Odor Afte Wound Margin: Flat and Intact Exudate Amount: Medium Exudate Type: Serosanguineous Exudate Color: red, brown r Cleansing: No Wound Bed Granulation Amount: Large (67-100%) Exposed Structure Granulation Quality: Red, Friable Fascia Exposed: No Necrotic Amount: None Present (0%) Fat Layer Exposed: No [name], [name] ([id number]) Tendon Exposed: No Muscle Exposed: No Joint Exposed: No Bone Exposed: No Limited to Skin Breakdown Periwound Skin Texture Texture Color No Abnormalities Noted: No No Abnormalities Noted: No Callus: No Atrophie Blanche: No Crepitus: No Cyanosis: No Excoriation: No Ecchymosis: No Fluctuance: No Erythema: No Friable: Yes Hemosiderin Staining: No Induration: No Mottled: No Localized Edema: No Pallor: No Rash: No Rubor: No Scarring: No Temperature / Pain Moisture Temperature: No Abnormality No Abnormalities Noted: No Tenderness on Palpation: Yes Dry / Scaly: No Maceration: No Moist: No Wound Preparation Ulcer Cleansing: Rinsed/Irrigated with Saline Topical Anesthetic Applied: None Electronic Signature(s) Signed: 07/12/2015 4:39:44 PM By: [name] Entered By: Brandon Cool, RN, [name], Kim on 07/12/2015 15:25:16 [place], [place]. ([id number]) -------------------------------------------------------------------------------- Wound Assessment Details Patient Name: [name], [name] Date of Service: 07/12/2015 3:45 PM Medical Record Number: [id number] Patient Account Number: [id number] Date of Birth/Sex: 19-Nov-1923 (82 y.o. Male) Treating RN: [name] Primary Care Physician: [facility], [name] Other Clinician: Referring Physician: [facility], [name] Treating Physician/Extender: [name] in Treatment: 1 Wound Status Wound Number: 2 Primary Etiology: Trauma, Other Wound Location:  Midline Back Wound Status: Open Wounding Event: Trauma Date Acquired: 06/23/2015 Weeks Of Treatment: 1 Clustered Wound: No Photos Photo Uploaded By: Brandon Cool, RN, [name], Kim on 07/12/2015 16:28:46 Wound Measurements Length: (cm) 0.7 Width: (cm) 0.5 Depth: (cm) 0.1 Area: (cm) 0.275 Volume: (cm) 0.027 % Reduction in Area: 93.5% % Reduction in Volume: 93.6% Wound Description Full Thickness Without Exposed Classification: Support Structures Periwound Skin Texture Texture Color No Abnormalities Noted: No No Abnormalities Noted: No Moisture No Abnormalities Noted: No Treatment Notes Wound #2 (Midline Back) [place], Markell L. ([id number]) 1. Cleansed with: Clean wound with Normal Saline 3. Peri-wound Care: Skin Prep 5. Secondary Dressing Applied Bordered Foam Dressing Electronic Signature(s) Signed: 07/12/2015 4:39:44 PM By: [name] Entered By: Brandon Cool, RN, [name], Kim on 07/12/2015 15:18:17 [place], [place] ([id number]) -------------------------------------------------------------------------------- Vitals Details Patient Name: Happe, Lora L. Date of Service: 07/12/2015 3:45 PM Medical Record Number: [id number] Patient Account Number: [id number] Date of Birth/Sex: Dec 15, 1923 (82 y.o. Male) Treating RN: [name] Primary Care Physician: [facility], [name] Other Clinician: Referring Physician: [facility], [name] Treating Physician/Extender: [name] in Treatment: 1 Vital Signs Time Taken: 15:10 Temperature (F): 97.8 Height (in): 70 Pulse (bpm): 79 Weight (lbs): 203 Respiratory Rate (breaths/min): 18 Body Mass Index (BMI): 29.1 Blood Pressure (mmHg): 136/45 Reference Range: 80 - 120 mg / dl Electronic Signature(s) Signed: 07/12/2015 4:39:44 PM By: [name] Entered By: Brandon Cool, RN, [name], Kim on 07/12/2015 15:11:24

## 2015-07-19 ENCOUNTER — Ambulatory Visit: Payer: Medicare Other | Admitting: Surgery

## 2015-07-24 ENCOUNTER — Other Ambulatory Visit: Payer: Self-pay | Admitting: Family Medicine

## 2015-07-24 DIAGNOSIS — N400 Enlarged prostate without lower urinary tract symptoms: Secondary | ICD-10-CM

## 2015-07-24 MED ORDER — DOXAZOSIN MESYLATE 1 MG PO TABS: 1.0000 mg | ORAL_TABLET | Freq: Every day | ORAL | Status: DC

## 2015-07-29 ENCOUNTER — Other Ambulatory Visit: Payer: Self-pay | Admitting: Family Medicine

## 2015-07-29 DIAGNOSIS — F411 Generalized anxiety disorder: Secondary | ICD-10-CM

## 2015-07-29 NOTE — Telephone Encounter (Signed)
Patient is needing a refill on Clonazepam.  He will take his last dose today.  Please contact patient's wife once complete.

## 2015-07-30 MED ORDER — CLONAZEPAM 0.5 MG PO TABS: 0.5000 mg | ORAL_TABLET | Freq: Three times a day (TID) | ORAL | Status: DC | PRN

## 2015-07-30 NOTE — Telephone Encounter (Signed)
Routed to Dr. Shah for medication refill approval 

## 2015-07-31 ENCOUNTER — Other Ambulatory Visit: Payer: Self-pay

## 2015-07-31 DIAGNOSIS — F411 Generalized anxiety disorder: Secondary | ICD-10-CM

## 2015-07-31 MED ORDER — CLONAZEPAM 0.5 MG PO TABS: 0.5000 mg | ORAL_TABLET | Freq: Three times a day (TID) | ORAL | Status: DC | PRN

## 2015-08-09 ENCOUNTER — Telehealth: Payer: Self-pay

## 2015-08-09 DIAGNOSIS — I2510 Atherosclerotic heart disease of native coronary artery without angina pectoris: Secondary | ICD-10-CM

## 2015-08-09 DIAGNOSIS — F331 Major depressive disorder, recurrent, moderate: Secondary | ICD-10-CM

## 2015-08-09 MED ORDER — METOPROLOL TARTRATE 25 MG PO TABS: 25.0000 mg | ORAL_TABLET | Freq: Two times a day (BID) | ORAL | Status: DC

## 2015-08-09 MED ORDER — SERTRALINE HCL 100 MG PO TABS: 150.0000 mg | ORAL_TABLET | Freq: Every day | ORAL | Status: DC

## 2015-08-09 NOTE — Telephone Encounter (Signed)
Metoprolol 25 mg has been refilled and sent to [place] [facility] and Sertraline prescription is ready for pickup by patient per Dr. Manuella Ghazi

## 2015-08-26 ENCOUNTER — Telehealth: Payer: Self-pay | Admitting: Family Medicine

## 2015-08-26 NOTE — Telephone Encounter (Signed)
Pt states he does not need the walker that they requested.

## 2015-08-26 NOTE — Telephone Encounter (Signed)
Left a voice message regarding clarification if they still need the walker or different mobility device.

## 2015-08-27 ENCOUNTER — Telehealth: Payer: Self-pay | Admitting: Family Medicine

## 2015-08-27 DIAGNOSIS — Z7409 Other reduced mobility: Secondary | ICD-10-CM

## 2015-08-27 DIAGNOSIS — F411 Generalized anxiety disorder: Secondary | ICD-10-CM

## 2015-08-27 MED ORDER — CLONAZEPAM 0.5 MG PO TABS: 0.5000 mg | ORAL_TABLET | Freq: Three times a day (TID) | ORAL | Status: DC | PRN

## 2015-08-27 NOTE — Telephone Encounter (Signed)
PT DAUGHTER SAID THAT THEY ARE STILL NEEDING TO GET A ROLLING WALKER WITH WHEELS AND A SEAT FOR THE PATIENT. PT HAS ONE BUT IT IS BROKEN AND HAS NO BRAKES AND  NEEDS TO BE REPLACED AND THEY PAID FOR THIS OUT OF POCKET. THE PLACE NEEDS AN ORDER TO WHERE THEY CAN GET SO WHEN THEY DECIDE WHERE TO GO THEY WILL HAVE THE ORDER AS NEEDED. THEY ARE ALSO WANTING TO KNOW WHERE THEY CAN GO AND GET THIS WALKER THAT WILL BILL THE INSURANCE COMPANY. PLEASE ADVISE  PLEASE CALL DAUGHTER Coleraine AT [phone]  PT ALSO NEEDS A REFILL ON CLONAZEPAM TO [facility] ON [address]

## 2015-08-27 NOTE — Telephone Encounter (Signed)
We'll provide a hand written prescription for a rolling walker with wheels to be taken to a medical supply store. Prescription for clonazepam is ready for pickup

## 2015-08-27 NOTE — Telephone Encounter (Signed)
Routed to Dr. Shah °

## 2015-09-12 ENCOUNTER — Encounter: Payer: Self-pay | Admitting: Family Medicine

## 2015-09-12 ENCOUNTER — Ambulatory Visit (INDEPENDENT_AMBULATORY_CARE_PROVIDER_SITE_OTHER): Payer: Medicare Other | Admitting: Family Medicine

## 2015-09-12 DIAGNOSIS — I6529 Occlusion and stenosis of unspecified carotid artery: Secondary | ICD-10-CM | POA: Diagnosis not present

## 2015-09-12 DIAGNOSIS — F919 Conduct disorder, unspecified: Secondary | ICD-10-CM

## 2015-09-12 NOTE — Progress Notes (Signed)
Name: [name]   MRN: [id number]    DOB: Jun 16, 1923   Date:09/12/2015       Progress Note  Subjective  Chief Complaint  Chief Complaint  Patient presents with  . Medication Refill    90 day refills of medication  . Anxiety    Patient states he has them every day-Clonazepam helps with symptom control. Patient states he can not stop worrying and the past couple of months have gotten worst. He refuses to take the anxiety evaluate due to personal issues making anxiety worst.   . Back Pain    Patient states his back pain is about a 4 right now and is a constant ongoing issue.     HPI  Behavioral Concerns: Pt. Is brought to the office visit today to discuss recent ongoing concerns about her husband's behavior. She states has noticed her husband getting frustrated easily, often criticizes other unrelated individuals. To give an example, she states her husband got agitated at a restaurant when the servers wont attend to them. At the assisted living place, he asked a stranger not to pick a fruit from the table. He often gets frustrated with his wife and will go and sit down in the other room after having an argument.    Past Medical History:  Diagnosis Date  . Anxiety   . BPH (benign prostatic hypertrophy)   . Carotid stenosis   . Chronic back pain   . Coronary artery disease   . Dyslipidemia   . GERD (gastroesophageal reflux disease)   . Hx of [name]   . Hyperlipidemia   . Hypertension   . Inguinal hernia   . 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 Major depression ([place])   . Multiple myeloma ([place])   . Pleural effusion    left  . Sleep apnea     Past Surgical History:  Procedure Laterality Date  . APPENDECTOMY    . CARDIAC CATHETERIZATION  2002  . CARDIAC CATHETERIZATION  06/2013  . COLONOSCOPY    . HIP FRACTURE SURGERY      Family History  Problem Relation Age of Onset  . Heart disease Mother   . Stroke Mother   . Diabetes Father   . Dementia Father   . Stroke  Sister   . Stroke Brother   . Stroke Sister   . Stroke Sister   . Stroke Brother   . Stroke Brother     Social History   Social History  . Marital status: Married    Spouse name: N/A  . Number of children: N/A  . Years of education: N/A   Occupational History  . Not on file.   Social History Main Topics  . Smoking status: Former Smoker    Packs/day: 2.00    Years: 7.00    Types: Cigarettes    Quit date: 09/17/1953  . Smokeless tobacco: Never Used  . Alcohol use No  . Drug use: No  . Sexual activity: Not Currently   Other Topics Concern  . Not on file   Social History Narrative  . No narrative on file     Current Outpatient Prescriptions:  .  aspirin EC 81 MG tablet, Take 1 tablet (81 mg total) by mouth daily., Disp: 90 tablet, Rfl: 3 .  Cholecalciferol (D 5000) 5000 UNITS TABS, Take by mouth., Disp: , Rfl:  .  clindamycin (CLEOCIN) 300 MG capsule, Take 1 capsule (300 mg total) by mouth 4 (four) times daily., Disp: 28  capsule, Rfl: 0 .  clonazePAM (KLONOPIN) 0.5 MG tablet, Take 1 tablet (0.5 mg total) by mouth 3 (three) times daily as needed for anxiety., Disp: 90 tablet, Rfl: 0 .  doxazosin (CARDURA) 1 MG tablet, Take 1 tablet (1 mg total) by mouth daily., Disp: 90 tablet, Rfl: 0 .  HYDROcodone-acetaminophen (NORCO/VICODIN) 5-325 MG tablet, Take 1 tablet by mouth 3 (three) times daily as needed. , Disp: , Rfl:  .  isosorbide mononitrate (IMDUR) 30 MG 24 hr tablet, Take 1 tablet (30 mg total) by mouth daily., Disp: 90 tablet, Rfl: 1 .  lisinopril (PRINIVIL,ZESTRIL) 2.5 MG tablet, Take 1 tablet (2.5 mg total) by mouth daily., Disp: 90 tablet, Rfl: 0 .  metoprolol tartrate (LOPRESSOR) 25 MG tablet, Take 1 tablet (25 mg total) by mouth 2 (two) times daily., Disp: 180 tablet, Rfl: 1 .  Multiple Vitamins-Minerals (PRESERVISION/LUTEIN PO), Take by mouth 2 (two) times daily., Disp: , Rfl:  .  nitroGLYCERIN (NITROSTAT) 0.4 MG SL tablet, Place 0.4 mg under the tongue every 5  (five) minutes as needed for chest pain., Disp: , Rfl:  .  pantoprazole (PROTONIX) 40 MG tablet, Take 1 tablet (40 mg total) by mouth daily., Disp: 90 tablet, Rfl: 0 .  polyethylene glycol (MIRALAX / GLYCOLAX) packet, Take 17 g by mouth daily., Disp: , Rfl:  .  pravastatin (PRAVACHOL) 40 MG tablet, Take 1 tablet (40 mg total) by mouth daily., Disp: 90 tablet, Rfl: 0 .  sertraline (ZOLOFT) 100 MG tablet, Take 1.5 tablets (150 mg total) by mouth daily., Disp: 45 tablet, Rfl: 2 .  traMADol (ULTRAM) 50 MG tablet, Take by mouth., Disp: , Rfl:   Allergies  Allergen Reactions  . Sulfamethoxazole-Trimethoprim Nausea Only and Other (See Comments)  . Baclofen      Review of Systems  Constitutional: Negative for chills, fever and weight loss.  Musculoskeletal: Positive for back pain.  Psychiatric/Behavioral: Positive for depression. The patient has insomnia.      Objective  Vitals:   09/12/15 1544  BP: 122/64  Pulse: 80  Resp: 18  Temp: 98 F (36.7 C)  TempSrc: Oral  SpO2: 93%  Weight: 207 lb 3.2 oz (94 kg)  Height: '5\' 10"'$  (1.778 m)    Physical Exam  Constitutional: He is oriented to person, place, and time and well-developed, well-nourished, and in no distress.  HENT:  Head: Normocephalic and atraumatic.  Cardiovascular: Normal rate, regular rhythm and normal heart sounds.   Pulmonary/Chest: Effort normal and breath sounds normal.  Neurological: He is alert and oriented to person, place, and time.  Psychiatric: Memory, affect and judgment normal. His mood appears anxious.  Nursing note and vitals reviewed.   Assessment & Plan  1. Behavior disturbance I had a long discussion with patient and his wife, based on the description of patient's behavior, it could be worsening anxiety, depression, general irritability, etc. Unlikely that this irritability is an early sign of dementia. Patient is motivated to change and promises me that he will change his behavior for the better.  Appears frustrated over her multiple medical problems, especially his chronic lower back pain. Encouraged continued follow up. Recheck in one month.  Note: Total face-to-face time 40 minutes. Greater than 50% spent in counseling and coordination of care. Included listening to patient's wife describe his symptoms, examples, discussion of work his behavior could mean, encouraging him to change his behavior.   Tresten Pantoja Asad A. [facility] 09/12/2015 4:12 PM

## 2015-09-24 ENCOUNTER — Telehealth: Payer: Self-pay | Admitting: Family Medicine

## 2015-09-24 DIAGNOSIS — I10 Essential (primary) hypertension: Secondary | ICD-10-CM

## 2015-09-24 DIAGNOSIS — F411 Generalized anxiety disorder: Secondary | ICD-10-CM

## 2015-09-24 MED ORDER — CLONAZEPAM 0.5 MG PO TABS: 0.5000 mg | ORAL_TABLET | Freq: Three times a day (TID) | ORAL | 0 refills | Status: DC | PRN

## 2015-09-24 MED ORDER — LISINOPRIL 2.5 MG PO TABS: 2.5000 mg | ORAL_TABLET | Freq: Every day | ORAL | 0 refills | Status: DC

## 2015-10-01 NOTE — Telephone Encounter (Signed)
COMPLETED

## 2015-10-02 ENCOUNTER — Telehealth: Payer: Self-pay | Admitting: Family Medicine

## 2015-10-02 DIAGNOSIS — K219 Gastro-esophageal reflux disease without esophagitis: Secondary | ICD-10-CM

## 2015-10-02 DIAGNOSIS — E7800 Pure hypercholesterolemia, unspecified: Secondary | ICD-10-CM

## 2015-10-03 MED ORDER — PANTOPRAZOLE SODIUM 40 MG PO TBEC: 40.0000 mg | DELAYED_RELEASE_TABLET | Freq: Every day | ORAL | 0 refills | Status: DC

## 2015-10-03 MED ORDER — PRAVASTATIN SODIUM 40 MG PO TABS: 40.0000 mg | ORAL_TABLET | Freq: Every day | ORAL | 0 refills | Status: DC

## 2015-10-03 NOTE — Telephone Encounter (Signed)
Prescription for Protonix and pravastatin have been sent to patient's pharmacy

## 2015-10-04 NOTE — Telephone Encounter (Signed)
LEFT MESSAGE RX IS READY AND AT [facility] FOR HIM AND WIFE

## 2015-10-14 ENCOUNTER — Encounter: Payer: Self-pay | Admitting: Family Medicine

## 2015-10-14 ENCOUNTER — Ambulatory Visit (INDEPENDENT_AMBULATORY_CARE_PROVIDER_SITE_OTHER): Payer: Medicare Other | Admitting: Family Medicine

## 2015-10-14 VITALS — BP 122/71 | HR 74 | Temp 97.9°F | Resp 17 | Ht 70.0 in | Wt 203.8 lb

## 2015-10-14 DIAGNOSIS — G8929 Other chronic pain: Secondary | ICD-10-CM | POA: Diagnosis not present

## 2015-10-14 DIAGNOSIS — F3342 Major depressive disorder, recurrent, in full remission: Secondary | ICD-10-CM

## 2015-10-14 DIAGNOSIS — F329 Major depressive disorder, single episode, unspecified: Secondary | ICD-10-CM | POA: Insufficient documentation

## 2015-10-14 DIAGNOSIS — M545 Low back pain, unspecified: Secondary | ICD-10-CM

## 2015-10-14 DIAGNOSIS — F411 Generalized anxiety disorder: Secondary | ICD-10-CM

## 2015-10-14 DIAGNOSIS — I6529 Occlusion and stenosis of unspecified carotid artery: Secondary | ICD-10-CM

## 2015-10-14 MED ORDER — SERTRALINE HCL 100 MG PO TABS: 150.0000 mg | ORAL_TABLET | Freq: Every day | ORAL | 2 refills | Status: DC

## 2015-10-14 MED ORDER — CLONAZEPAM 0.5 MG PO TABS: 0.5000 mg | ORAL_TABLET | Freq: Three times a day (TID) | ORAL | 0 refills | Status: DC | PRN

## 2015-10-14 NOTE — Progress Notes (Signed)
Name: [name]   MRN: [id number]    DOB: [birth year]   Date:10/14/2015       Progress Note  Subjective  Chief Complaint  Chief Complaint  Patient presents with  . Follow-up  . Hyperlipidemia    HPI  Pt. Presents to day to discuss going off his pain medication (hydrocodone-Acetaminophen 5-325 mg every 8 hours as needed) for chronic low back pain due to multilevel degenerative disc disease. He wishes to go off Hydrocodone because it has recently gotten more expensive ($20 more for the same amount) and he feels the medication is not helping his back pain that much anyway. He takes one tablet at 4 PM and another one at bedtime around 10 PM. He also sees a Pain clinic regularly and has received injections to his lower back. Patient plans to take plain Tylenol 500 mg twice daily to help with his back pain.   Past Medical History:  Diagnosis Date  . Anxiety   . BPH (benign prostatic hypertrophy)   . Carotid stenosis   . Chronic back pain   . Coronary artery disease   . Dyslipidemia   . GERD (gastroesophageal reflux disease)   . Hx of [name]   . Hyperlipidemia   . Hypertension   . Inguinal hernia   . 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 Major depression ([place])   . Multiple myeloma ([place])   . Pleural effusion    left  . Sleep apnea     Past Surgical History:  Procedure Laterality Date  . APPENDECTOMY    . CARDIAC CATHETERIZATION  2002  . CARDIAC CATHETERIZATION  06/2013  . COLONOSCOPY    . HIP FRACTURE SURGERY      Family History  Problem Relation Age of Onset  . Heart disease Mother   . Stroke Mother   . Diabetes Father   . Dementia Father   . Stroke Sister   . Stroke Brother   . Stroke Sister   . Stroke Sister   . Stroke Brother   . Stroke Brother     Social History   Social History  . Marital status: Married    Spouse name: N/A  . Number of children: N/A  . Years of education: N/A   Occupational History  . Not on file.   Social History Main Topics    . Smoking status: Former Smoker    Packs/day: 2.00    Years: 7.00    Types: Cigarettes    Quit date: 09/17/1953  . Smokeless tobacco: Never Used  . Alcohol use No  . Drug use: No  . Sexual activity: Not Currently   Other Topics Concern  . Not on file   Social History Narrative  . No narrative on file     Current Outpatient Prescriptions:  .  aspirin EC 81 MG tablet, Take 1 tablet (81 mg total) by mouth daily., Disp: 90 tablet, Rfl: 3 .  Cholecalciferol (D 5000) 5000 UNITS TABS, Take by mouth., Disp: , Rfl:  .  clindamycin (CLEOCIN) 300 MG capsule, Take 1 capsule (300 mg total) by mouth 4 (four) times daily., Disp: 28 capsule, Rfl: 0 .  clonazePAM (KLONOPIN) 0.5 MG tablet, Take 1 tablet (0.5 mg total) by mouth 3 (three) times daily as needed for anxiety., Disp: 90 tablet, Rfl: 0 .  doxazosin (CARDURA) 1 MG tablet, Take 1 tablet (1 mg total) by mouth daily., Disp: 90 tablet, Rfl: 0 .  HYDROcodone-acetaminophen (NORCO/VICODIN) 5-325 MG tablet, Take 1  tablet by mouth 3 (three) times daily as needed. , Disp: , Rfl:  .  isosorbide mononitrate (IMDUR) 30 MG 24 hr tablet, Take 1 tablet (30 mg total) by mouth daily., Disp: 90 tablet, Rfl: 1 .  lisinopril (PRINIVIL,ZESTRIL) 2.5 MG tablet, Take 1 tablet (2.5 mg total) by mouth daily., Disp: 90 tablet, Rfl: 0 .  metoprolol tartrate (LOPRESSOR) 25 MG tablet, Take 1 tablet (25 mg total) by mouth 2 (two) times daily., Disp: 180 tablet, Rfl: 1 .  Multiple Vitamins-Minerals (PRESERVISION/LUTEIN PO), Take by mouth 2 (two) times daily., Disp: , Rfl:  .  nitroGLYCERIN (NITROSTAT) 0.4 MG SL tablet, Place 0.4 mg under the tongue every 5 (five) minutes as needed for chest pain., Disp: , Rfl:  .  pantoprazole (PROTONIX) 40 MG tablet, Take 1 tablet (40 mg total) by mouth daily., Disp: 90 tablet, Rfl: 0 .  polyethylene glycol (MIRALAX / GLYCOLAX) packet, Take 17 g by mouth daily., Disp: , Rfl:  .  pravastatin (PRAVACHOL) 40 MG tablet, Take 1 tablet (40 mg  total) by mouth daily., Disp: 90 tablet, Rfl: 0 .  sertraline (ZOLOFT) 100 MG tablet, Take 1.5 tablets (150 mg total) by mouth daily., Disp: 45 tablet, Rfl: 2 .  traMADol (ULTRAM) 50 MG tablet, Take by mouth., Disp: , Rfl:   Allergies  Allergen Reactions  . Sulfamethoxazole-Trimethoprim Nausea Only and Other (See Comments)  . Baclofen      Review of Systems  Constitutional: Negative for chills and fever.  Respiratory: Negative for cough and shortness of breath.   Cardiovascular: Negative for chest pain.  Gastrointestinal: Negative for heartburn, nausea and vomiting.  Musculoskeletal: Positive for back pain and joint pain.  Psychiatric/Behavioral: Positive for depression. The patient is nervous/anxious and has insomnia.     Objective  Vitals:   10/14/15 1047  BP: 122/71  Pulse: 74  Resp: 17  Temp: 97.9 F (36.6 C)  TempSrc: Oral  SpO2: 94%  Weight: 203 lb 12.8 oz (92.4 kg)  Height: '5\' 10"'$  (1.778 m)    Physical Exam  Constitutional: He is oriented to person, place, and time and well-developed, well-nourished, and in no distress.  Cardiovascular: Normal rate, regular rhythm, S1 normal, S2 normal and normal heart sounds.  Exam reveals no gallop.   No murmur heard. Pulmonary/Chest: Effort normal and breath sounds normal. He has no wheezes. He has no rhonchi.  Musculoskeletal:       Right ankle: He exhibits no swelling.       Left ankle: He exhibits no swelling.       Lumbar back: He exhibits tenderness and pain.       Back:  Neurological: He is alert and oriented to person, place, and time.  Skin: Skin is warm and dry.  Psychiatric: Mood, memory, affect and judgment normal.  Nursing note and vitals reviewed.    Assessment & Plan  1. Chronic LBP Advised to decrease hydrocodone-acetaminophen by 1 tablet every week and discontinue over 2 weeks. He may take Tylenol 500 mg twice daily as needed for chronic and recurrent back pain. She should also continue to follow at  pain clinic  2. Generalized anxiety disorder Stable, responsive to clonazepam taken 3 times daily as needed. Patient compliant with controlled substances agreement, understands the dependence potential, side effects, and drug interactions of opioids with benzodiazepines. - clonazePAM (KLONOPIN) 0.5 MG tablet; Take 1 tablet (0.5 mg total) by mouth 3 (three) times daily as needed for anxiety.  Dispense: 90 tablet; Refill: 0  3. Recurrent major depressive disorder, in full remission ([place])  - sertraline (ZOLOFT) 100 MG tablet; Take 1.5 tablets (150 mg total) by mouth at bedtime.  Dispense: 45 tablet; Refill: 2   Jadee Golebiewski Asad A. [facility] 10/14/2015 11:11 AM

## 2015-10-23 ENCOUNTER — Telehealth: Payer: Self-pay | Admitting: Family Medicine

## 2015-10-23 DIAGNOSIS — N400 Enlarged prostate without lower urinary tract symptoms: Secondary | ICD-10-CM

## 2015-10-23 MED ORDER — DOXAZOSIN MESYLATE 1 MG PO TABS: 1.0000 mg | ORAL_TABLET | Freq: Every day | ORAL | 0 refills | Status: DC

## 2015-10-23 NOTE — Telephone Encounter (Signed)
Prescription for doxazosin is sent to patient's pharmacy

## 2015-11-05 ENCOUNTER — Telehealth: Payer: Self-pay | Admitting: Family Medicine

## 2015-11-05 DIAGNOSIS — I2510 Atherosclerotic heart disease of native coronary artery without angina pectoris: Secondary | ICD-10-CM

## 2015-11-06 MED ORDER — ISOSORBIDE MONONITRATE ER 30 MG PO TB24: 30.0000 mg | ORAL_TABLET | Freq: Every day | ORAL | 1 refills | Status: DC

## 2015-11-06 NOTE — Telephone Encounter (Signed)
Prescription for isosorbide mononitrate is sent to patient's pharmacy

## 2015-11-07 NOTE — Telephone Encounter (Signed)
Pt informed

## 2015-11-18 ENCOUNTER — Encounter: Payer: Self-pay | Admitting: Family Medicine

## 2015-11-18 ENCOUNTER — Ambulatory Visit (INDEPENDENT_AMBULATORY_CARE_PROVIDER_SITE_OTHER): Payer: Medicare Other | Admitting: Family Medicine

## 2015-11-18 DIAGNOSIS — I6529 Occlusion and stenosis of unspecified carotid artery: Secondary | ICD-10-CM | POA: Diagnosis not present

## 2015-11-18 DIAGNOSIS — F411 Generalized anxiety disorder: Secondary | ICD-10-CM

## 2015-11-18 MED ORDER — CLONAZEPAM 0.5 MG PO TABS: 0.5000 mg | ORAL_TABLET | Freq: Three times a day (TID) | ORAL | 0 refills | Status: DC | PRN

## 2015-11-18 NOTE — Progress Notes (Signed)
Name: [name]   MRN: [id number]    DOB: 12-Dec-1923   Date:11/18/2015       Progress Note  Subjective  Chief Complaint  Chief Complaint  Patient presents with  . Follow-up    refill clonazepam    Anxiety  Presents for follow-up visit. Symptoms include depressed mood, excessive worry, irritability and nervous/anxious behavior. The severity of symptoms is moderate and causing significant distress. The quality of sleep is fair.    Patient asking if we could increase clonazepam to 4 times daily as needed, has worsening anxiety  Past Medical History:  Diagnosis Date  . Anxiety   . BPH (benign prostatic hypertrophy)   . Carotid stenosis   . Chronic back pain   . Coronary artery disease   . Dyslipidemia   . GERD (gastroesophageal reflux disease)   . Hx of [name]   . Hyperlipidemia   . Hypertension   . Inguinal hernia   . 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 Major depression   . Multiple myeloma ([place])   . Pleural effusion    left  . Sleep apnea     Past Surgical History:  Procedure Laterality Date  . APPENDECTOMY    . CARDIAC CATHETERIZATION  2002  . CARDIAC CATHETERIZATION  06/2013  . COLONOSCOPY    . HIP FRACTURE SURGERY      Family History  Problem Relation Age of Onset  . Heart disease Mother   . Stroke Mother   . Diabetes Father   . Dementia Father   . Stroke Sister   . Stroke Brother   . Stroke Sister   . Stroke Sister   . Stroke Brother   . Stroke Brother     Social History   Social History  . Marital status: Married    Spouse name: N/A  . Number of children: N/A  . Years of education: N/A   Occupational History  . Not on file.   Social History Main Topics  . Smoking status: Former Smoker    Packs/day: 2.00    Years: 7.00    Types: Cigarettes    Quit date: 09/17/1953  . Smokeless tobacco: Never Used  . Alcohol use No  . Drug use: No  . Sexual activity: Not Currently   Other Topics Concern  . Not on file   Social History Narrative   . No narrative on file     Current Outpatient Prescriptions:  .  aspirin EC 81 MG tablet, Take 1 tablet (81 mg total) by mouth daily., Disp: 90 tablet, Rfl: 3 .  Cholecalciferol (D 5000) 5000 UNITS TABS, Take by mouth., Disp: , Rfl:  .  clindamycin (CLEOCIN) 300 MG capsule, Take 1 capsule (300 mg total) by mouth 4 (four) times daily., Disp: 28 capsule, Rfl: 0 .  clonazePAM (KLONOPIN) 0.5 MG tablet, Take 1 tablet (0.5 mg total) by mouth 3 (three) times daily as needed for anxiety., Disp: 90 tablet, Rfl: 0 .  doxazosin (CARDURA) 1 MG tablet, Take 1 tablet (1 mg total) by mouth daily., Disp: 90 tablet, Rfl: 0 .  HYDROcodone-acetaminophen (NORCO/VICODIN) 5-325 MG tablet, Take 1 tablet by mouth 3 (three) times daily as needed. , Disp: , Rfl:  .  isosorbide mononitrate (IMDUR) 30 MG 24 hr tablet, Take 1 tablet (30 mg total) by mouth daily., Disp: 90 tablet, Rfl: 1 .  lisinopril (PRINIVIL,ZESTRIL) 2.5 MG tablet, Take 1 tablet (2.5 mg total) by mouth daily., Disp: 90 tablet, Rfl: 0 .  metoprolol tartrate (LOPRESSOR) 25 MG tablet, Take 1 tablet (25 mg total) by mouth 2 (two) times daily., Disp: 180 tablet, Rfl: 1 .  Multiple Vitamins-Minerals (PRESERVISION/LUTEIN PO), Take by mouth 2 (two) times daily., Disp: , Rfl:  .  nitroGLYCERIN (NITROSTAT) 0.4 MG SL tablet, Place 0.4 mg under the tongue every 5 (five) minutes as needed for chest pain., Disp: , Rfl:  .  pantoprazole (PROTONIX) 40 MG tablet, Take 1 tablet (40 mg total) by mouth daily., Disp: 90 tablet, Rfl: 0 .  polyethylene glycol (MIRALAX / GLYCOLAX) packet, Take 17 g by mouth daily., Disp: , Rfl:  .  pravastatin (PRAVACHOL) 40 MG tablet, Take 1 tablet (40 mg total) by mouth daily., Disp: 90 tablet, Rfl: 0 .  sertraline (ZOLOFT) 100 MG tablet, Take 1.5 tablets (150 mg total) by mouth at bedtime., Disp: 45 tablet, Rfl: 2  Allergies  Allergen Reactions  . Sulfamethoxazole-Trimethoprim Nausea Only and Other (See Comments)  . Baclofen       Review of Systems  Constitutional: Positive for irritability.  Psychiatric/Behavioral: The patient is nervous/anxious.      Objective  Vitals:   11/18/15 0903  BP: 118/62  Pulse: 80  Resp: 18  Temp: 97.8 F (36.6 C)  TempSrc: Oral  SpO2: 95%  Weight: 200 lb 8 oz (90.9 kg)  Height: '5\' 10"'$  (1.778 m)    Physical Exam  Constitutional: He is well-developed, well-nourished, and in no distress.  HENT:  Head: Normocephalic and atraumatic.  Cardiovascular: Normal rate, regular rhythm, S1 normal and S2 normal.  Exam reveals no gallop.   Pulmonary/Chest: Effort normal and breath sounds normal. He has no wheezes.  Psychiatric: Mood, memory, affect and judgment normal.  Nursing note and vitals reviewed.   Assessment & Plan  1. Generalized anxiety disorder We will recommend keeping patient on clonazepam up to 3 times daily as needed. Refills provided - clonazePAM (KLONOPIN) 0.5 MG tablet; Take 1 tablet (0.5 mg total) by mouth 3 (three) times daily as needed for anxiety.  Dispense: 90 tablet; Refill: 0   Ravon Mcilhenny Asad A. [facility] 11/18/2015 9:37 AM

## 2015-12-18 ENCOUNTER — Other Ambulatory Visit: Payer: Self-pay | Admitting: Family Medicine

## 2015-12-18 DIAGNOSIS — I10 Essential (primary) hypertension: Secondary | ICD-10-CM

## 2015-12-18 DIAGNOSIS — F411 Generalized anxiety disorder: Secondary | ICD-10-CM

## 2015-12-18 NOTE — Telephone Encounter (Signed)
Pt needs refill on Lisinopril and Clonazepam. [place].

## 2015-12-20 MED ORDER — LISINOPRIL 2.5 MG PO TABS: 2.5000 mg | ORAL_TABLET | Freq: Every day | ORAL | 0 refills | Status: DC

## 2015-12-20 MED ORDER — CLONAZEPAM 0.5 MG PO TABS: 0.5000 mg | ORAL_TABLET | Freq: Three times a day (TID) | ORAL | 0 refills | Status: DC | PRN

## 2015-12-20 NOTE — Telephone Encounter (Signed)
Lisinopril 2.5 mg has been refilled and sent to [place], refill request for clonazepam 0.5 mg has been routed to Dr. Manuella Ghazi for approval

## 2016-01-02 ENCOUNTER — Telehealth: Payer: Self-pay | Admitting: Family Medicine

## 2016-01-02 DIAGNOSIS — E7800 Pure hypercholesterolemia, unspecified: Secondary | ICD-10-CM

## 2016-01-02 MED ORDER — PRAVASTATIN SODIUM 40 MG PO TABS: 40.0000 mg | ORAL_TABLET | Freq: Every day | ORAL | 0 refills | Status: DC

## 2016-01-02 NOTE — Telephone Encounter (Signed)
Medication has been refilled and sent to [facility] [address] 

## 2016-01-02 NOTE — Telephone Encounter (Signed)
Pt needs refill on Pravastatin [place].

## 2016-01-06 ENCOUNTER — Telehealth: Payer: Self-pay | Admitting: Family Medicine

## 2016-01-06 DIAGNOSIS — F411 Generalized anxiety disorder: Secondary | ICD-10-CM

## 2016-01-06 DIAGNOSIS — F3342 Major depressive disorder, recurrent, in full remission: Secondary | ICD-10-CM

## 2016-01-06 NOTE — Telephone Encounter (Signed)
Requesting refill on Sertraline 100mg . Please send to [facility]. Have enough to get through this week.

## 2016-01-08 MED ORDER — SERTRALINE HCL 100 MG PO TABS: 150.0000 mg | ORAL_TABLET | Freq: Every day | ORAL | 2 refills | Status: DC

## 2016-01-08 NOTE — Telephone Encounter (Signed)
Routed to Dr. Manuella Ghazi for approval Requesting refill on Sertraline 100mg . Please send to [facility]. Have enough to get through this week.

## 2016-01-08 NOTE — Telephone Encounter (Signed)
Prescription sent to pharmacy.

## 2016-01-15 MED ORDER — CLONAZEPAM 0.5 MG PO TABS: 0.5000 mg | ORAL_TABLET | Freq: Three times a day (TID) | ORAL | 0 refills | Status: DC | PRN

## 2016-01-15 NOTE — Telephone Encounter (Signed)
Medication has been refilled and ready for patient pick up per Dr. Shah 

## 2016-01-23 ENCOUNTER — Telehealth: Payer: Self-pay | Admitting: Family Medicine

## 2016-01-23 NOTE — Telephone Encounter (Signed)
There is no response to the message you sent back to me. Did you do the RX

## 2016-01-23 NOTE — Telephone Encounter (Signed)
Pt is needing his DOXAZOSIN called into [facility] on [address]

## 2016-01-24 ENCOUNTER — Other Ambulatory Visit: Payer: Self-pay | Admitting: Family Medicine

## 2016-01-24 DIAGNOSIS — N400 Enlarged prostate without lower urinary tract symptoms: Secondary | ICD-10-CM

## 2016-01-28 NOTE — Telephone Encounter (Signed)
complete

## 2016-01-30 ENCOUNTER — Encounter: Payer: Medicare Other | Attending: Surgery | Admitting: Surgery

## 2016-01-30 DIAGNOSIS — Z8579 Personal history of other malignant neoplasms of lymphoid, hematopoietic and related tissues: Secondary | ICD-10-CM | POA: Insufficient documentation

## 2016-01-30 DIAGNOSIS — Z87891 Personal history of nicotine dependence: Secondary | ICD-10-CM | POA: Diagnosis not present

## 2016-01-30 DIAGNOSIS — S51011A Laceration without foreign body of right elbow, initial encounter: Secondary | ICD-10-CM | POA: Diagnosis present

## 2016-01-30 DIAGNOSIS — E785 Hyperlipidemia, unspecified: Secondary | ICD-10-CM | POA: Diagnosis not present

## 2016-01-30 DIAGNOSIS — S81012A Laceration without foreign body, left knee, initial encounter: Secondary | ICD-10-CM | POA: Diagnosis not present

## 2016-01-30 DIAGNOSIS — F419 Anxiety disorder, unspecified: Secondary | ICD-10-CM | POA: Diagnosis not present

## 2016-01-30 DIAGNOSIS — Z79899 Other long term (current) drug therapy: Secondary | ICD-10-CM | POA: Diagnosis not present

## 2016-01-30 DIAGNOSIS — Z8249 Family history of ischemic heart disease and other diseases of the circulatory system: Secondary | ICD-10-CM | POA: Insufficient documentation

## 2016-01-30 DIAGNOSIS — K219 Gastro-esophageal reflux disease without esophagitis: Secondary | ICD-10-CM | POA: Insufficient documentation

## 2016-01-30 DIAGNOSIS — I2510 Atherosclerotic heart disease of native coronary artery without angina pectoris: Secondary | ICD-10-CM | POA: Insufficient documentation

## 2016-01-30 DIAGNOSIS — S21212A Laceration without foreign body of left back wall of thorax without penetration into thoracic cavity, initial encounter: Secondary | ICD-10-CM | POA: Insufficient documentation

## 2016-01-30 DIAGNOSIS — N400 Enlarged prostate without lower urinary tract symptoms: Secondary | ICD-10-CM | POA: Insufficient documentation

## 2016-01-30 DIAGNOSIS — W19XXXA Unspecified fall, initial encounter: Secondary | ICD-10-CM | POA: Diagnosis not present

## 2016-01-30 DIAGNOSIS — S41112A Laceration without foreign body of left upper arm, initial encounter: Secondary | ICD-10-CM | POA: Insufficient documentation

## 2016-01-30 DIAGNOSIS — I6529 Occlusion and stenosis of unspecified carotid artery: Secondary | ICD-10-CM | POA: Diagnosis not present

## 2016-01-30 DIAGNOSIS — G510 Bell's palsy: Secondary | ICD-10-CM | POA: Insufficient documentation

## 2016-01-30 NOTE — Progress Notes (Addendum)
[name], [name] ([id number]) Visit Report for 01/30/2016 Allergy List Details Patient Name: [name], [name] Date of Service: 01/30/2016 8:45 AM Medical Record Number: [id number] Patient Account Number: [id number] Date of Birth/Sex: 1923/07/27 (82 y.o. Male) Treating RN: Afful, RN, BSN, Velva Harman Primary Care Physician: [facility], Dossie Der Other Clinician: Referring Physician: [facility], Dossie Der Treating Physician/Extender: Frann Rider in Treatment: 0 Allergies Active Allergies baclofen Bactrim Allergy Notes Electronic Signature(s) Signed: 01/30/2016 8:35:36 AM By: Regan Lemming BSN, RN Entered By: Regan Lemming on 01/30/2016 08:35:36 [employer] [place]. ([id number]) -------------------------------------------------------------------------------- Arrival Information Details Patient Name: [name], [name] Date of Service: 01/30/2016 8:45 AM Medical Record Number: [id number] Patient Account Number: [id number] Date of Birth/Sex: [birth year] (82 y.o. Male) Treating RN: Afful, RN, BSN, [employer] Primary Care Physician: [facility], Dossie Der Other Clinician: Referring Physician: [facility], Dossie Der Treating Physician/Extender: Frann Rider in Treatment: 0 Visit Information Patient Arrived: Walker Arrival Time: 08:42 Accompanied By: wife Transfer Assistance: None Patient Identification Verified: Yes Secondary Verification Process Completed: Yes Patient Requires Transmission-Based No Precautions: Patient Has Alerts: No History Since Last Visit All ordered tests and consults were completed: No Added or deleted any medications: No Any new allergies or adverse reactions: No Had a fall or experienced change in activities of daily living that may affect risk of falls: No Signs or symptoms of abuse/neglect since last visito No Hospitalized since last visit: No Electronic Signature(s) Signed: 01/30/2016 4:56:10 PM By: Regan Lemming BSN, RN Entered By: Regan Lemming on 01/30/2016 08:46:27 [place], Brandon.  ([id number]) -------------------------------------------------------------------------------- Clinic Level of Care Assessment Details Patient Name: [name], [name] Date of Service: 01/30/2016 8:45 AM Medical Record Number: [id number] Patient Account Number: [id number] Date of Birth/Sex: 12-06-1923 (82 y.o. Male) Treating RN: Afful, RN, BSN, Velva Harman Primary Care Physician: [facility], Dossie Der Other Clinician: Referring Physician: [facility], Dossie Der Treating Physician/Extender: Frann Rider in Treatment: 0 Clinic Level of Care Assessment Items TOOL 1 Quantity Score []  - Use when EandM and Procedure is performed on INITIAL visit 0 ASSESSMENTS - Nursing Assessment / Reassessment X - General Physical Exam (combine w/ comprehensive assessment (listed just 1 20 below) when performed on new pt. evals) X - Comprehensive Assessment (HX, ROS, Risk Assessments, Wounds Hx, etc.) 1 25 ASSESSMENTS - Wound and Skin Assessment / Reassessment []  - Dermatologic / Skin Assessment (not related to wound area) 0 ASSESSMENTS - Ostomy and/or Continence Assessment and Care []  - Incontinence Assessment and Management 0 []  - Ostomy Care Assessment and Management (repouching, etc.) 0 PROCESS - Coordination of Care X - Simple Patient / Family Education for ongoing care 1 15 []  - Complex (extensive) Patient / Family Education for ongoing care 0 X - Staff obtains Programmer, systems, Records, Test Results / Process Orders 1 10 []  - Staff telephones HHA, Nursing Homes / Clarify orders / etc 0 []  - Routine Transfer to another Facility (non-emergent condition) 0 []  - Routine Hospital Admission (non-emergent condition) 0 X - New Admissions / Biomedical engineer / Ordering NPWT, Apligraf, etc. 1 15 []  - Emergency Hospital Admission (emergent condition) 0 PROCESS - Special Needs []  - Pediatric / Minor Patient Management 0 []  - Isolation Patient Management 0 [name], Kirby L. ([id number]) []  - Hearing / Language / Visual special needs 0 []  -  Assessment of Community assistance (transportation, D/C planning, etc.) 0 []  - Additional assistance / Altered mentation 0 []  - Support Surface(s) Assessment (bed, cushion, seat, etc.) 0 INTERVENTIONS - Miscellaneous []  - External ear exam 0 []  - Patient Transfer (multiple staff / Civil Service fast streamer / Similar  devices) 0 []  - Simple Staple / Suture removal (25 or less) 0 []  - Complex Staple / Suture removal (26 or more) 0 []  - Hypo/Hyperglycemic Management (do not check if billed separately) 0 []  - Ankle / Brachial Index (ABI) - do not check if billed separately 0 Has the patient been seen at the hospital within the last three years: Yes Total Score: 85 Level Of Care: New/Established - Level 3 Electronic Signature(s) Signed: 01/30/2016 4:56:10 PM By: Regan Lemming BSN, RN Entered By: Regan Lemming on 01/30/2016 12:09:52 [name], [name] ([id number]) -------------------------------------------------------------------------------- Encounter Discharge Information Details Patient Name: [name], [name] Date of Service: 01/30/2016 8:45 AM Medical Record Number: [id number] Patient Account Number: [id number] Date of Birth/Sex: [birth year] (82 y.o. Male) Treating RN: Afful, RN, BSN, Velva Harman Primary Care Physician: [facility], Dossie Der Other Clinician: Referring Physician: [facility], Dossie Der Treating Physician/Extender: Frann Rider in Treatment: 0 Encounter Discharge Information Items Discharge Pain Level: 0 Discharge Condition: Stable Ambulatory Status: Walker Discharge Destination: Home Transportation: Private Auto Accompanied By: wife Schedule Follow-up Appointment: No Medication Reconciliation completed and provided to Patient/Care No Rosamaria Donn: Provided on Clinical Summary of Care: 01/30/2016 Form Type Recipient Paper Patient AS Electronic Signature(s) Signed: 01/30/2016 4:47:45 PM By: Regan Lemming BSN, RN Previous Signature: 01/30/2016 9:47:42 AM Version By: Ruthine Dose Entered By: Regan Lemming on  01/30/2016 16:47:45 [place], Finneus L. ([id number]) -------------------------------------------------------------------------------- Lower Extremity Assessment Details Patient Name: [name], [name] Date of Service: 01/30/2016 8:45 AM Medical Record Number: [id number] Patient Account Number: [id number] Date of Birth/Sex: 28-Oct-1923 (82 y.o. Male) Treating RN: Afful, RN, BSN, [employer] Primary Care Physician: [facility], Dossie Der Other Clinician: Referring Physician: [facility], Dossie Der Treating Physician/Extender: Frann Rider in Treatment: 0 Electronic Signature(s) Signed: 01/30/2016 4:56:10 PM By: Regan Lemming BSN, RN Entered By: Regan Lemming on 01/30/2016 08:52:58 [name], [name] ([id number]) -------------------------------------------------------------------------------- Multi Wound Chart Details Patient Name: [name], [name] Date of Service: 01/30/2016 8:45 AM Medical Record Number: [id number] Patient Account Number: [id number] Date of Birth/Sex: 11/24/1923 (82 y.o. Male) Treating RN: Afful, RN, BSN, [employer] Primary Care Physician: [facility], Dossie Der Other Clinician: Referring Physician: [facility], Dossie Der Treating Physician/Extender: Frann Rider in Treatment: 0 Vital Signs Height(in): Pulse(bpm): 70 Weight(lbs): Blood Pressure 118/47 (mmHg): Body Mass Index(BMI): Temperature(F): 97.5 Respiratory Rate 19 (breaths/min): Photos: [3:No Photos] [4:No Photos] [5:No Photos] Wound Location: [3:Left Back] [4:Left Elbow] [5:Right Elbow] Wounding Event: [3:Shear/Friction] [4:Shear/Friction] [5:Shear/Friction] Primary Etiology: [3:Skin Tear] [4:Skin Tear] [5:Skin Tear] Comorbid History: [3:Cataracts, Chronic sinus problems/congestion, Sleep Apnea, Coronary Artery Disease, Osteoarthritis, Received Chemotherapy] [4:Cataracts, Chronic sinus problems/congestion, Sleep Apnea, Coronary Artery Disease, Osteoarthritis,  Received Chemotherapy] [5:Cataracts, Chronic sinus problems/congestion, Sleep Apnea, Coronary Artery Disease,  Osteoarthritis, Received Chemotherapy] Date Acquired: [3:01/25/2016] [4:01/25/2016] [5:01/25/2016] Weeks of Treatment: [3:0] [4:0] [5:0] Wound Status: [3:Open] [4:Open] [5:Open] Measurements L x W x D 10x14x0.1 [4:8.5x6x0.1] [5:3x2x0.1] (cm) Area (cm) : [3:109.956] [4:40.055] [5:4.712] Volume (cm) : [3:10.996] [4:4.006] [5:0.471] Classification: [3:Partial Thickness] [4:Partial Thickness] [5:Partial Thickness] Exudate Amount: [3:Medium] [4:Medium] [5:Medium] Exudate Type: [3:Sanguinous] [4:Sanguinous] [5:Sanguinous] Exudate Color: [3:red] [4:red] [5:red] Wound Margin: [3:Distinct, outline attached] [4:Distinct, outline attached] [5:Distinct, outline attached] Granulation Amount: [3:Medium (34-66%)] [4:Large (67-100%)] [5:Large (67-100%)] Granulation Quality: [3:Red] [4:Red] [5:Red, Pink] Necrotic Amount: [3:Small (1-33%)] [4:Small (1-33%)] [5:None Present (0%)] Exposed Structures: [3:Fascia: No Fat: No Tendon: No Muscle: No Joint: No] [4:Fascia: No Fat: No Tendon: No Muscle: No Joint: No] [5:Fascia: No Fat: No Tendon: No Muscle: No Joint: No] Bone: No Bone: No Bone: No Limited to Skin Limited to Skin Limited to Skin Breakdown Breakdown Breakdown Epithelialization: None  None None Periwound Skin Texture: Edema: No Edema: No Edema: No Excoriation: No Excoriation: No Excoriation: No Induration: No Induration: No Induration: No Callus: No Callus: No Callus: No Crepitus: No Crepitus: No Crepitus: No Fluctuance: No Fluctuance: No Fluctuance: No Friable: No Friable: No Friable: No Rash: No Rash: No Rash: No Scarring: No Scarring: No Scarring: No Periwound Skin Moist: Yes Moist: Yes Moist: Yes Moisture: Maceration: No Maceration: No Maceration: No Dry/Scaly: No Dry/Scaly: No Dry/Scaly: No Periwound Skin Color: Atrophie Blanche: No Atrophie Blanche: No Atrophie Blanche: No Cyanosis: No Cyanosis: No Cyanosis: No Ecchymosis: No Ecchymosis: No Ecchymosis:  No Erythema: No Erythema: No Erythema: No Hemosiderin Staining: No Hemosiderin Staining: No Hemosiderin Staining: No Mottled: No Mottled: No Mottled: No Pallor: No Pallor: No Pallor: No Rubor: No Rubor: No Rubor: No Temperature: No Abnormality No Abnormality No Abnormality Tenderness on No Yes No Palpation: Wound Preparation: Ulcer Cleansing: Ulcer Cleansing: Ulcer Cleansing: Rinsed/Irrigated with Rinsed/Irrigated with Rinsed/Irrigated with Saline Saline Saline Topical Anesthetic Topical Anesthetic Topical Anesthetic Applied: None Applied: None Applied: None Wound Number: 6 N/A N/A Photos: No Photos N/A N/A Wound Location: Left Knee N/A N/A Wounding Event: Shear/Friction N/A N/A Primary Etiology: Skin Tear N/A N/A Comorbid History: Cataracts, Chronic sinus N/A N/A problems/congestion, Sleep Apnea, Coronary Artery Disease, Osteoarthritis, Received Chemotherapy Date Acquired: 01/25/2016 N/A N/A Weeks of Treatment: 0 N/A N/A Wound Status: Open N/A N/A Measurements L x W x D 2x1x0.1 N/A N/A (cm) Guard, Gershon L. ([id number]) Area (cm) : 1.571 N/A N/A Volume (cm) : 0.157 N/A N/A Classification: Partial Thickness N/A N/A Exudate Amount: Medium N/A N/A Exudate Type: Sanguinous N/A N/A Exudate Color: red N/A N/A Wound Margin: Distinct, outline attached N/A N/A Granulation Amount: Large (67-100%) N/A N/A Granulation Quality: Red, Pink N/A N/A Necrotic Amount: None Present (0%) N/A N/A Exposed Structures: Fascia: No N/A N/A Fat: No Tendon: No Muscle: No Joint: No Bone: No Limited to Skin Breakdown Epithelialization: None N/A N/A Periwound Skin Texture: Edema: No N/A N/A Excoriation: No Induration: No Callus: No Crepitus: No Fluctuance: No Friable: No Rash: No Scarring: No Periwound Skin Moist: Yes N/A N/A Moisture: Maceration: No Dry/Scaly: No Periwound Skin Color: Atrophie Blanche: No N/A N/A Cyanosis: No Ecchymosis: No Erythema: No Hemosiderin  Staining: No Mottled: No Pallor: No Rubor: No Temperature: No Abnormality N/A N/A Tenderness on No N/A N/A Palpation: Wound Preparation: Ulcer Cleansing: N/A N/A Rinsed/Irrigated with Saline Topical Anesthetic Applied: None Treatment Notes GRYPHON, MAUTE ([id number]) Electronic Signature(s) Signed: 01/30/2016 4:56:10 PM By: Regan Lemming BSN, RN Entered By: Regan Lemming on 01/30/2016 09:11:22 [place], Aleks LMarland Kitchen ([id number]) -------------------------------------------------------------------------------- Multi-Disciplinary Care Plan Details Patient Name: [name], [name] Date of Service: 01/30/2016 8:45 AM Medical Record Number: [id number] Patient Account Number: [id number] Date of Birth/Sex: Jul 22, 1923 (82 y.o. Male) Treating RN: Afful, RN, BSN, [employer] Primary Care Physician: [facility], Dossie Der Other Clinician: Referring Physician: Keith Rake Treating Physician/Extender: Frann Rider in Treatment: 0 Active Inactive Abuse / Safety / Falls / Self Care Management Nursing Diagnoses: Impaired home maintenance Impaired physical mobility Knowledge deficit related to: safety; personal, health (wound), emergency Potential for falls Self care deficit: actual or potential Goals: Patient will remain injury free Date Initiated: 01/30/2016 Goal Status: Active Patient/caregiver will verbalize understanding of skin care regimen Date Initiated: 01/30/2016 Goal Status: Active Patient/caregiver will verbalize/demonstrate measures taken to prevent injury and/or falls Date Initiated: 01/30/2016 Goal Status: Active Patient/caregiver will verbalize/demonstrate understanding of what to do in case of emergency Date Initiated: 01/30/2016 Goal Status: Active Interventions: Assess  fall risk on admission and as needed Assess: immobility, friction, shearing, incontinence upon admission and as needed Assess impairment of mobility on admission and as needed per policy Assess self care needs on admission and  as needed Provide education on basic hygiene Provide education on fall prevention Provide education on personal and home safety Provide education on safe transfers Notes: MILANO, RENZ ([id number]) Orientation to the Wound Care Program Nursing Diagnoses: Knowledge deficit related to the wound healing center program Goals: Patient/caregiver will verbalize understanding of the [place] Program Date Initiated: 01/30/2016 Goal Status: Active Interventions: Provide education on orientation to the wound center Notes: Wound/Skin Impairment Nursing Diagnoses: Impaired tissue integrity Knowledge deficit related to ulceration/compromised skin integrity Goals: Patient/caregiver will verbalize understanding of skin care regimen Date Initiated: 01/30/2016 Goal Status: Active Ulcer/skin breakdown will have a volume reduction of 30% by week 4 Date Initiated: 01/30/2016 Goal Status: Active Ulcer/skin breakdown will have a volume reduction of 50% by week 8 Date Initiated: 01/30/2016 Goal Status: Active Ulcer/skin breakdown will have a volume reduction of 80% by week 12 Date Initiated: 01/30/2016 Goal Status: Active Ulcer/skin breakdown will heal within 14 weeks Date Initiated: 01/30/2016 Goal Status: Active Interventions: Assess patient/caregiver ability to obtain necessary supplies Assess patient/caregiver ability to perform ulcer/skin care regimen upon admission and as needed Assess ulceration(s) every visit Provide education on ulcer and skin care Treatment Activities: Referred to DME Adolph Clutter for dressing supplies : 01/30/2016 YAZEED, CULVER. ([id number]) Skin care regimen initiated : 01/30/2016 Topical wound management initiated : 01/30/2016 Notes: Electronic Signature(s) Signed: 01/30/2016 4:56:10 PM By: Regan Lemming BSN, RN Entered By: Regan Lemming on 01/30/2016 09:10:56 [place], Armanii L.  ([id number]) -------------------------------------------------------------------------------- Pain Assessment Details Patient Name: [name], [name] Date of Service: 01/30/2016 8:45 AM Medical Record Number: [id number] Patient Account Number: [id number] Date of Birth/Sex: 07-11-1923 (82 y.o. Male) Treating RN: Afful, RN, BSN, Velva Harman Primary Care Physician: [facility], Dossie Der Other Clinician: Referring Physician: [facility], Dossie Der Treating Physician/Extender: Frann Rider in Treatment: 0 Active Problems Location of Pain Severity and Description of Pain Patient Has Paino No Site Locations With Dressing Change: No Pain Management and Medication Current Pain Management: Electronic Signature(s) Signed: 01/30/2016 4:56:10 PM By: Regan Lemming BSN, RN Entered By: Regan Lemming on 01/30/2016 08:46:35 [place], [place]. ([id number]) -------------------------------------------------------------------------------- Patient/Caregiver Education Details Patient Name: Gonsoulin, Kurt L. Date of Service: 01/30/2016 8:45 AM Medical Record Number: [id number] Patient Account Number: [id number] Date of Birth/Gender: 08/08/1923 (82 y.o. Male) Treating RN: Afful, RN, BSN, [employer] Primary Care Physician: [facility], Dossie Der Other Clinician: Referring Physician: Keith Rake Treating Physician/Extender: Frann Rider in Treatment: 0 Education Assessment Education Provided To: Patient Education Topics Provided Basic Hygiene: Methods: Explain/Verbal Responses: State content correctly Safety: Methods: Explain/Verbal Responses: State content correctly Welcome To The [place]: Methods: Explain/Verbal Responses: State content correctly Wound/Skin Impairment: Methods: Explain/Verbal Responses: State content correctly Electronic Signature(s) Signed: 01/30/2016 4:56:10 PM By: Regan Lemming BSN, RN Entered By: Regan Lemming on 01/30/2016 16:48:04 [place], Timmothy L.  ([id number]) -------------------------------------------------------------------------------- Wound Assessment Details Patient Name: [name], [name] Date of Service: 01/30/2016 8:45 AM Medical Record Number: [id number] Patient Account Number: [id number] Date of Birth/Sex: 13-Sep-1923 (82 y.o. Male) Treating RN: Afful, RN, BSN, Velva Harman Primary Care Physician: [facility], Dossie Der Other Clinician: Referring Physician: [facility], Dossie Der Treating Physician/Extender: Frann Rider in Treatment: 0 Wound Status Wound Number: 3 Primary Skin Tear Etiology: Wound Location: Left Back Wound Open Wounding Event: Shear/Friction Status: Date Acquired: 01/25/2016 Comorbid Cataracts, Chronic sinus Weeks Of Treatment: 0 History:  problems/congestion, Sleep Apnea, Clustered Wound: No Coronary Artery Disease, Osteoarthritis, Received Chemotherapy Photos Photo Uploaded By: Regan Lemming on 01/30/2016 16:45:44 Wound Measurements Length: (cm) 10 Width: (cm) 14 Depth: (cm) 0.1 Area: (cm) 109.956 Volume: (cm) 10.996 % Reduction in Area: % Reduction in Volume: Epithelialization: None Tunneling: No Undermining: No Wound Description Classification: Partial Thickness Foul Odor Aft Wound Margin: Distinct, outline attached Exudate Amount: Medium Nghiem, [name] ([id number]) er Cleansing: No Exudate Type: Sanguinous Exudate Color: red Wound Bed Granulation Amount: Medium (34-66%) Exposed Structure Granulation Quality: Red Fascia Exposed: No Necrotic Amount: Small (1-33%) Fat Layer Exposed: No Necrotic Quality: Adherent Slough Tendon Exposed: No Muscle Exposed: No Joint Exposed: No Bone Exposed: No Limited to Skin Breakdown Periwound Skin Texture Texture Color No Abnormalities Noted: No No Abnormalities Noted: No Callus: No Atrophie Blanche: No Crepitus: No Cyanosis: No Excoriation: No Ecchymosis: No Fluctuance: No Erythema: No Friable: No Hemosiderin Staining: No Induration: No Mottled:  No Localized Edema: No Pallor: No Rash: No Rubor: No Scarring: No Temperature / Pain Moisture Temperature: No Abnormality No Abnormalities Noted: No Dry / Scaly: No Maceration: No Moist: Yes Wound Preparation Ulcer Cleansing: Rinsed/Irrigated with Saline Topical Anesthetic Applied: None Treatment Notes Wound #3 (Left Back) 1. Cleansed with: Clean wound with Normal Saline 4. Dressing Applied: Hydrogel Mepitel Non-Adherent gauze 5. Secondary Dressing Applied Bordered Foam Dressing Gauze and Kerlix/Conform Tegaderm JAQUIL, MARESCA ([id number]) Electronic Signature(s) Signed: 01/30/2016 4:56:10 PM By: Regan Lemming BSN, RN Entered By: Regan Lemming on 01/30/2016 09:00:21 [place], Orel L. ([id number]) -------------------------------------------------------------------------------- Wound Assessment Details Patient Name: Ridinger, Teagan L. Date of Service: 01/30/2016 8:45 AM Medical Record Number: [id number] Patient Account Number: [id number] Date of Birth/Sex: 02-19-1923 (82 y.o. Male) Treating RN: Afful, RN, BSN, Velva Harman Primary Care Physician: [facility], Dossie Der Other Clinician: Referring Physician: [facility], Dossie Der Treating Physician/Extender: Frann Rider in Treatment: 0 Wound Status Wound Number: 4 Primary Skin Tear Etiology: Wound Location: Left Elbow Wound Open Wounding Event: Shear/Friction Status: Date Acquired: 01/25/2016 Comorbid Cataracts, Chronic sinus Weeks Of Treatment: 0 History: problems/congestion, Sleep Apnea, Clustered Wound: No Coronary Artery Disease, Osteoarthritis, Received Chemotherapy Photos Photo Uploaded By: Regan Lemming on 01/30/2016 16:45:45 Wound Measurements Length: (cm) 8.5 Width: (cm) 6 Depth: (cm) 0.1 Area: (cm) 40.055 Volume: (cm) 4.006 % Reduction in Area: % Reduction in Volume: Epithelialization: None Tunneling: No Undermining: No Wound Description Classification: Partial Thickness Foul Odor Aft Wound Margin: Distinct, outline  attached Exudate Amount: Medium Beckum, Donavin L. ([id number]) er Cleansing: No Exudate Type: Sanguinous Exudate Color: red Wound Bed Granulation Amount: Large (67-100%) Exposed Structure Granulation Quality: Red Fascia Exposed: No Necrotic Amount: Small (1-33%) Fat Layer Exposed: No Necrotic Quality: Adherent Slough Tendon Exposed: No Muscle Exposed: No Joint Exposed: No Bone Exposed: No Limited to Skin Breakdown Periwound Skin Texture Texture Color No Abnormalities Noted: No No Abnormalities Noted: No Callus: No Atrophie Blanche: No Crepitus: No Cyanosis: No Excoriation: No Ecchymosis: No Fluctuance: No Erythema: No Friable: No Hemosiderin Staining: No Induration: No Mottled: No Localized Edema: No Pallor: No Rash: No Rubor: No Scarring: No Temperature / Pain Moisture Temperature: No Abnormality No Abnormalities Noted: No Tenderness on Palpation: Yes Dry / Scaly: No Maceration: No Moist: Yes Wound Preparation Ulcer Cleansing: Rinsed/Irrigated with Saline Topical Anesthetic Applied: None Treatment Notes Wound #4 (Left Elbow) 1. Cleansed with: Clean wound with Normal Saline 4. Dressing Applied: Hydrogel Mepitel Non-Adherent gauze 5. Secondary Dressing Applied Bordered Foam Dressing Gauze and Kerlix/Conform Tegaderm [place]el[place], Damontae L. ([id number]) Electronic Signature(s) Signed: 01/30/2016 4:56:10 PM By: Baruch Gouty,  Velva Harman BSN, RN Entered By: Regan Lemming on 01/30/2016 09:01:31 Lemay, Antwone L. ([id number]) -------------------------------------------------------------------------------- Wound Assessment Details Patient Name: Carmicheal, Rameses L. Date of Service: 01/30/2016 8:45 AM Medical Record Number: [id number] Patient Account Number: [id number] Date of Birth/Sex: 14-Sep-1923 (82 y.o. Male) Treating RN: Afful, RN, BSN, [employer] Primary Care Physician: [facility], Dossie Der Other Clinician: Referring Physician: [facility], Dossie Der Treating Physician/Extender: Frann Rider in Treatment:  0 Wound Status Wound Number: 5 Primary Skin Tear Etiology: Wound Location: Right Elbow Wound Open Wounding Event: Shear/Friction Status: Date Acquired: 01/25/2016 Comorbid Cataracts, Chronic sinus Weeks Of Treatment: 0 History: problems/congestion, Sleep Apnea, Clustered Wound: No Coronary Artery Disease, Osteoarthritis, Received Chemotherapy Photos Photo Uploaded By: Regan Lemming on 01/30/2016 16:46:07 Wound Measurements Length: (cm) 3 Width: (cm) 2 Depth: (cm) 0.1 Area: (cm) 4.712 Volume: (cm) 0.471 % Reduction in Area: % Reduction in Volume: Epithelialization: None Tunneling: No Undermining: No Wound Description Classification: Partial Thickness Wound Margin: Distinct, outline attached Exudate Amount: Medium Exudate Type: Sanguinous Exudate Color: red Foul Odor After Cleansing: No Wound Bed Granulation Amount: Large (67-100%) Exposed Structure Granulation Quality: Red, Pink Fascia Exposed: No Necrotic Amount: None Present (0%) Fat Layer Exposed: No Mohiuddin, Constantino L. ([id number]) Tendon Exposed: No Muscle Exposed: No Joint Exposed: No Bone Exposed: No Limited to Skin Breakdown Periwound Skin Texture Texture Color No Abnormalities Noted: No No Abnormalities Noted: No Callus: No Atrophie Blanche: No Crepitus: No Cyanosis: No Excoriation: No Ecchymosis: No Fluctuance: No Erythema: No Friable: No Hemosiderin Staining: No Induration: No Mottled: No Localized Edema: No Pallor: No Rash: No Rubor: No Scarring: No Temperature / Pain Moisture Temperature: No Abnormality No Abnormalities Noted: No Dry / Scaly: No Maceration: No Moist: Yes Wound Preparation Ulcer Cleansing: Rinsed/Irrigated with Saline Topical Anesthetic Applied: None Treatment Notes Wound #5 (Right Elbow) 1. Cleansed with: Clean wound with Normal Saline 4. Dressing Applied: Hydrogel Mepitel Non-Adherent gauze 5. Secondary Dressing Applied Bordered Foam Dressing Gauze and  Kerlix/Conform Tegaderm Electronic Signature(s) Signed: 01/30/2016 4:56:10 PM By: Regan Lemming BSN, RN Entered By: Regan Lemming on 01/30/2016 09:02:38 [place], Treylon L. ([id number]) -------------------------------------------------------------------------------- Wound Assessment Details Patient Name: Mclear, Raihan L. Date of Service: 01/30/2016 8:45 AM Medical Record Number: [id number] Patient Account Number: [id number] Date of Birth/Sex: 1923/09/30 (82 y.o. Male) Treating RN: Afful, RN, BSN, [employer] Primary Care Physician: [name], Dossie Der Other Clinician: Referring Physician: [facility], Dossie Der Treating Physician/Extender: Frann Rider in Treatment: 0 Wound Status Wound Number: 6 Primary Skin Tear Etiology: Wound Location: Left Knee Wound Open Wounding Event: Shear/Friction Status: Date Acquired: 01/25/2016 Comorbid Cataracts, Chronic sinus Weeks Of Treatment: 0 History: problems/congestion, Sleep Apnea, Clustered Wound: No Coronary Artery Disease, Osteoarthritis, Received Chemotherapy Photos Photo Uploaded By: Regan Lemming on 01/30/2016 16:46:08 Wound Measurements Length: (cm) 2 Width: (cm) 1 Depth: (cm) 0.1 Area: (cm) 1.571 Volume: (cm) 0.157 % Reduction in Area: % Reduction in Volume: Epithelialization: None Tunneling: No Undermining: No Wound Description Classification: Partial Thickness Foul Odor Aft Wound Margin: Distinct, outline attached Exudate Amount: Medium Roddey, Erhard L. ([id number]) er Cleansing: No Exudate Type: Sanguinous Exudate Color: red Wound Bed Granulation Amount: Large (67-100%) Exposed Structure Granulation Quality: Red, Pink Fascia Exposed: No Necrotic Amount: None Present (0%) Fat Layer Exposed: No Tendon Exposed: No Muscle Exposed: No Joint Exposed: No Bone Exposed: No Limited to Skin Breakdown Periwound Skin Texture Texture Color No Abnormalities Noted: No No Abnormalities Noted: No Callus: No Atrophie Blanche: No Crepitus: No Cyanosis:  No Excoriation: No Ecchymosis: No Fluctuance: No Erythema: No Friable: No Hemosiderin Staining: No Induration:  No Mottled: No Localized Edema: No Pallor: No Rash: No Rubor: No Scarring: No Temperature / Pain Moisture Temperature: No Abnormality No Abnormalities Noted: No Dry / Scaly: No Maceration: No Moist: Yes Wound Preparation Ulcer Cleansing: Rinsed/Irrigated with Saline Topical Anesthetic Applied: None Treatment Notes Wound #6 (Left Knee) 1. Cleansed with: Clean wound with Normal Saline 4. Dressing Applied: Hydrogel Mepitel Non-Adherent gauze 5. Secondary Dressing Applied Bordered Foam Dressing Gauze and Kerlix/Conform Tegaderm LAMONTEZ, KLEIST ([id number]) Electronic Signature(s) Signed: 01/30/2016 4:56:10 PM By: Regan Lemming BSN, RN Entered By: Regan Lemming on 01/30/2016 09:03:40 [place], [place] ([id number]) -------------------------------------------------------------------------------- Vitals Details Patient Name: Koogler, Antoinette L. Date of Service: 01/30/2016 8:45 AM Medical Record Number: [id number] Patient Account Number: [id number] Date of Birth/Sex: 1923-12-08 (82 y.o. Male) Treating RN: Afful, RN, BSN, Velva Harman Primary Care Physician: [facility], Dossie Der Other Clinician: Referring Physician: [facility], Dossie Der Treating Physician/Extender: Frann Rider in Treatment: 0 Vital Signs Time Taken: 08:46 Temperature (F): 97.5 Pulse (bpm): 70 Respiratory Rate (breaths/min): 19 Blood Pressure (mmHg): 118/47 Reference Range: 80 - 120 mg / dl Electronic Signature(s) Signed: 01/30/2016 4:56:10 PM By: Regan Lemming BSN, RN Entered By: Regan Lemming on 01/30/2016 08:46:58

## 2016-01-30 NOTE — Progress Notes (Signed)
KARINA, ZEISLOFT ([id number]) Visit Report for 01/30/2016 Abuse/Suicide Risk Screen Details Patient Name: [name], [name] Date of Service: 01/30/2016 8:45 AM Medical Record Number: [id number] Patient Account Number: [id number] Date of Birth/Sex: April 13, 1923 (81 y.o. Male) Treating RN: Afful, RN, BSN, [employer] Primary Care Physician: [facility], Dossie Der Other Clinician: Referring Physician: [facility], Dossie Der Treating Physician/Extender: Frann Rider in Treatment: 0 Abuse/Suicide Risk Screen Items Answer ABUSE/SUICIDE RISK SCREEN: Has anyone close to you tried to hurt or harm you recentlyo No Do you feel uncomfortable with anyone in your familyo No Has anyone forced you do things that you didnot want to doo No Do you have any thoughts of harming yourselfo No Patient displays signs or symptoms of abuse and/or neglect. No Electronic Signature(s) Signed: 01/30/2016 8:36:26 AM By: Regan Lemming BSN, RN Entered By: Regan Lemming on 01/30/2016 08:36:26 [name], [name] ([id number]) -------------------------------------------------------------------------------- Activities of Daily Living Details Patient Name: [name], [name] Date of Service: 01/30/2016 8:45 AM Medical Record Number: [id number] Patient Account Number: [id number] Date of Birth/Sex: 02/27/1923 (82 y.o. Male) Treating RN: Afful, RN, BSN, Velva Harman Primary Care Physician: [facility], Dossie Der Other Clinician: Referring Physician: [facility], Dossie Der Treating Physician/Extender: Frann Rider in Treatment: 0 Activities of Daily Living Items Answer Activities of Daily Living (Please select one for each item) Drive Automobile Not Able Take Medications Need Assistance Use Telephone Need Assistance Care for Appearance Need Assistance Use Toilet Need Assistance Bath / Shower Need Assistance Dress Self Need Assistance Feed Self Need Assistance Walk Need Assistance Get In / Out Bed Need Assistance Housework Need Assistance Prepare Meals Need Assistance Handle Money  Need Assistance Shop for Self Need Assistance Electronic Signature(s) Signed: 01/30/2016 8:36:57 AM By: Regan Lemming BSN, RN Entered By: Regan Lemming on 01/30/2016 08:36:57 [name], [name] ([id number]) -------------------------------------------------------------------------------- Education Assessment Details Patient Name: [name], [name] Date of Service: 01/30/2016 8:45 AM Medical Record Number: [id number] Patient Account Number: [id number] Date of Birth/Sex: 03-13-1923 (82 y.o. Male) Treating RN: Afful, RN, BSN, [employer] Primary Care Physician: [facility], Dossie Der Other Clinician: Referring Physician: [facility], Dossie Der Treating Physician/Extender: Frann Rider in Treatment: 0 Primary Learner Assessed: Patient Learning Preferences/Education Level/Primary Language Learning Preference: Explanation Highest Education Level: High School Preferred Language: English Cognitive Barrier Assessment/Beliefs Language Barrier: No Physical Barrier Assessment Impaired Vision: Yes Glasses Impaired Hearing: No Decreased Hand dexterity: No Knowledge/Comprehension Assessment Knowledge Level: Medium Comprehension Level: Medium Ability to understand written Medium instructions: Ability to understand verbal Medium instructions: Motivation Assessment Anxiety Level: Calm Cooperation: Cooperative Education Importance: Acknowledges Need Interest in Health Problems: Asks Questions Perception: Coherent Willingness to Engage in Self- Medium Management Activities: Readiness to Engage in Self- Medium Management Activities: Electronic Signature(s) Signed: 01/30/2016 8:37:19 AM By: Regan Lemming BSN, RN Entered By: Regan Lemming on 01/30/2016 08:37:19 [name], [name] ([id number]) -------------------------------------------------------------------------------- Fall Risk Assessment Details Patient Name: [name], [name] Date of Service: 01/30/2016 8:45 AM Medical Record Number: [id number] Patient Account Number:  [id number] Date of Birth/Sex: [birth year] (82 y.o. Male) Treating RN: Afful, RN, BSN, [place] Primary Care Physician: [facility], Dossie Der Other Clinician: Referring Physician: [name], Dossie Der Treating Physician/Extender: Frann Rider in Treatment: 0 Fall Risk Assessment Items Have you had 2 or more falls in the last 12 monthso 0 Yes Have you had any fall that resulted in injury in the last 12 monthso 0 Yes FALL RISK ASSESSMENT: History of falling - immediate or within 3 months 0 No Secondary diagnosis 0 No Ambulatory aid None/bed rest/wheelchair/nurse 0 Yes Crutches/cane/walker 0 No Furniture 0 No IV Access/Saline Lock 0  No Gait/Training Normal/bed rest/immobile 0 No Weak 10 Yes Impaired 20 Yes Mental Status Oriented to own ability 0 No Electronic Signature(s) Signed: 01/30/2016 8:37:37 AM By: Regan Lemming BSN, RN Entered By: Regan Lemming on 01/30/2016 08:37:37 [name] ([id number]) -------------------------------------------------------------------------------- Foot Assessment Details Patient Name: [name], [name] Date of Service: 01/30/2016 8:45 AM Medical Record Number: [id number] Patient Account Number: [id number] Date of Birth/Sex: 12-26-1923 (82 y.o. Male) Treating RN: Afful, RN, BSN, [employer] Primary Care Physician: [facility], Dossie Der Other Clinician: Referring Physician: [facility], Dossie Der Treating Physician/Extender: Frann Rider in Treatment: 0 Foot Assessment Items Site Locations + = Sensation present, - = Sensation absent, C = Callus, U = Ulcer R = Redness, W = Warmth, M = Maceration, PU = Pre-ulcerative lesion F = Fissure, S = Swelling, D = Dryness Assessment Right: Left: Other Deformity: No No Prior Foot Ulcer: No No Prior Amputation: No No Charcot Joint: No No Ambulatory Status: Ambulatory With Help Assistance Device: Walker Gait: Administrator, arts) Signed: 01/30/2016 8:38:20 AM By: Regan Lemming BSN, RN Entered By: Regan Lemming on 01/30/2016 08:38:20 [place],  [place] ([id number]) -------------------------------------------------------------------------------- Nutrition Risk Assessment Details Patient Name: [name], [name] Date of Service: 01/30/2016 8:45 AM Medical Record Number: [id number] Patient Account Number: [id number] Date of Birth/Sex: 06/08/1923 (82 y.o. Male) Treating RN: Afful, RN, BSN, [employer] Primary Care Physician: [facility], Dossie Der Other Clinician: Referring Physician: [facility], [facility] Treating Physician/Extender: Frann Rider in Treatment: 0 Height (in): 70 Weight (lbs): 203 Body Mass Index (BMI): 29.1 Nutrition Risk Assessment Items NUTRITION RISK SCREEN: I have an illness or condition that made me change the kind and/or 0 No amount of food I eat I eat fewer than two meals per day 0 No I eat few fruits and vegetables, or milk products 0 No I have three or more drinks of beer, liquor or wine almost every day 0 No I have tooth or mouth problems that make it hard for me to eat 0 No I don't always have enough money to buy the food I need 0 No I eat alone most of the time 0 No I take three or more different prescribed or over-the-counter drugs a 0 No day Without wanting to, I have lost or gained 10 pounds in the last six 0 No months I am not always physically able to shop, cook and/or feed myself 0 No Nutrition Protocols Good Risk Protocol 0 No interventions needed Moderate Risk Protocol Electronic Signature(s) Signed: 01/30/2016 8:37:58 AM By: Regan Lemming BSN, RN Entered By: Regan Lemming on 01/30/2016 08:37:58

## 2016-01-31 NOTE — Progress Notes (Signed)
TILAK, OAKLEY ([id number]) Visit Report for 01/30/2016 Chief Complaint Document Details Patient Name: [name], [name] Date of Service: 01/30/2016 8:45 AM Medical Record Number: [id number] Patient Account Number: [id number] Date of Birth/Sex: 1923/11/08 (82 y.o. Male) Treating RN: Afful, RN, BSN, Velva Harman Primary Care Physician: [name], Dossie Der Other Clinician: Referring Physician: [facility], Dossie Der Treating Physician/Extender: Frann Rider in Treatment: 0 Information Obtained from: Patient Chief Complaint Patient presents to the wound care center for a consult due non healing wound to his right elbow, left knee, left upper arm and left upper back which she's had for about a week Electronic Signature(s) Signed: 01/30/2016 9:40:09 AM By: Christin Fudge MD, FACS Entered By: Christin Fudge on 01/30/2016 [place], Philbert L. ([id number]) -------------------------------------------------------------------------------- Debridement Details Patient Name: [name], [name] Date of Service: 01/30/2016 8:45 AM Medical Record Number: [id number] Patient Account Number: [id number] Date of Birth/Sex: November 23, 1923 (82 y.o. Male) Treating RN: Afful, RN, BSN, [place] Primary Care Physician: [facility], Dossie Der Other Clinician: Referring Physician: [facility], Dossie Der Treating Physician/Extender: Frann Rider in Treatment: 0 Debridement Performed for Wound #3 Left Back Assessment: Performed By: Physician Christin Fudge, MD Debridement: Debridement Pre-procedure Yes - 09:16 Verification/Time Out Taken: Start Time: 09:20 Pain Control: Lidocaine 4% Topical Solution Level: Skin/Subcutaneous Tissue Total Area Debrided (L x 3 (cm) x 2 (cm) = 6 (cm) W): Tissue and other Viable, Non-Viable, Fibrin/Slough, Skin, Subcutaneous material debrided: Instrument: Forceps, Scissors Bleeding: Minimum Hemostasis Achieved: Pressure End Time: 09:24 Procedural Pain: 0 Post Procedural Pain: 0 Response to Treatment: Procedure was tolerated  well Post Debridement Measurements of Total Wound Length: (cm) 10 Width: (cm) 14 Depth: (cm) 0.1 Volume: (cm) 10.996 Character of Wound/Ulcer Post Stable Debridement: Severity of Tissue Post Debridement: Fat layer exposed Post Procedure Diagnosis Same as Pre-procedure Electronic Signature(s) Signed: 01/30/2016 9:38:32 AM By: Christin Fudge MD, FACS Signed: 01/30/2016 4:56:10 PM By: Regan Lemming BSN, RN Entered By: Christin Fudge on 01/30/2016 09:38:32 Afonso, Keny L. ([id number]) [place], Shogo L. ([id number]) -------------------------------------------------------------------------------- Debridement Details Patient Name: [name], [name] Date of Service: 01/30/2016 8:45 AM Medical Record Number: [id number] Patient Account Number: [id number] Date of Birth/Sex: [birth year] (82 y.o. Male) Treating RN: Afful, RN, BSN, [place] Primary Care Physician: [facility], Dossie Der Other Clinician: Referring Physician: [facility], Dossie Der Treating Physician/Extender: Frann Rider in Treatment: 0 Debridement Performed for Wound #4 Left Elbow Assessment: Performed By: Physician Christin Fudge, MD Debridement: Debridement Pre-procedure Yes - 09:16 Verification/Time Out Taken: Start Time: 09:16 Pain Control: Lidocaine 4% Topical Solution Level: Skin/Subcutaneous Tissue Total Area Debrided (L x 5 (cm) x 4 (cm) = 20 (cm) W): Tissue and other Viable, Non-Viable, Fibrin/Slough, Skin, Subcutaneous material debrided: Instrument: Forceps, Scissors Bleeding: Minimum Hemostasis Achieved: Pressure End Time: 09:20 Procedural Pain: 0 Post Procedural Pain: 0 Response to Treatment: Procedure was tolerated well Post Debridement Measurements of Total Wound Length: (cm) 8.5 Width: (cm) 6 Depth: (cm) 0.1 Volume: (cm) 4.006 Character of Wound/Ulcer Post Stable Debridement: Severity of Tissue Post Debridement: Fat layer exposed Post Procedure Diagnosis Same as Pre-procedure Notes skin excised, pulled back in place  and Steri-Stripped with appropriate nonadhering dressing placed above this Electronic Signature(s) Signed: 01/30/2016 9:39:13 AM By: Christin Fudge MD, FACS [name] ([id number]) Signed: 01/30/2016 4:56:10 PM By: Regan Lemming BSN, RN Entered By: Christin Fudge on 01/30/2016 09:39:13 In[place], Sara L. ([id number]) -------------------------------------------------------------------------------- Debridement Details Patient Name: [name], [name] Date of Service: 01/30/2016 8:45 AM Medical Record Number: [id number] Patient Account Number: [id number] Date of Birth/Sex: [birth year] (82 y.o. Male) Treating RN: Afful, RN, BSN, Velva Harman  Primary Care Physician: [facility], [facility] Other Clinician: Referring Physician: [facility], [place] Treating Physician/Extender: Frann Rider in Treatment: 0 Debridement Performed for Wound #5 Right Elbow Assessment: Performed By: Physician Christin Fudge, MD Debridement: Debridement Pre-procedure Yes - 09:16 Verification/Time Out Taken: Start Time: 09:13 Pain Control: Lidocaine 4% Topical Solution Level: Skin/Subcutaneous Tissue Total Area Debrided (L x 3 (cm) x 2 (cm) = 6 (cm) W): Tissue and other Viable, Non-Viable, Fibrin/Slough, Skin, Subcutaneous material debrided: Instrument: Forceps, Scissors Bleeding: Minimum Hemostasis Achieved: Pressure End Time: 09:16 Procedural Pain: 0 Post Procedural Pain: 0 Response to Treatment: Procedure was tolerated well Post Debridement Measurements of Total Wound Length: (cm) 3 Width: (cm) 2 Depth: (cm) 0.1 Volume: (cm) 0.471 Character of Wound/Ulcer Post Stable Debridement: Severity of Tissue Post Debridement: Fat layer exposed Post Procedure Diagnosis Same as Pre-procedure Electronic Signature(s) Signed: 01/30/2016 9:39:30 AM By: Christin Fudge MD, FACS Signed: 01/30/2016 4:56:10 PM By: Regan Lemming BSN, RN Entered By: Christin Fudge on 01/30/2016 09:39:30 [place], Orley L. ([id number]) Bogie, Chike L.  ([id number]) -------------------------------------------------------------------------------- HPI Details Patient Name: [name], [name] Date of Service: 01/30/2016 8:45 AM Medical Record Number: [id number] Patient Account Number: [id number] Date of Birth/Sex: 1923/03/17 (82 y.o. Male) Treating RN: Afful, RN, BSN, Velva Harman Primary Care Physician: [facility], Dossie Der Other Clinician: Referring Physician: [facility], Dossie Der Treating Physician/Extender: Frann Rider in Treatment: 0 History of Present Illness Location: right elbow, left knee, left upper arm and left upper back Quality: Patient reports experiencing a dull pain to affected area(s). Severity: Patient states wound (s) are getting better. Duration: Patient has had the wound for < 1 weeks prior to presenting for treatment Timing: Pain in wound is Intermittent (comes and goes Context: The wound occurred when the patient had a fall and hurt his back Modifying Factors: Other treatment(s) tried include:local care with Neosporin ointment Associated Signs and Symptoms: Patient reports having:scar tissue opens out every time they do dressing HPI Description: 82 year old patient who was in [place] in May a couple of times for a lacerated wound which was healed over a couple of weeks. He now comes with recent injuries to his back and knees and both arms sustained after a fall. He did not seek any medical help and they use dressing material which was previously there. Past medical history significant for carotid stenosis, [name], [name], dyslipidemia, hypertension, anxiety, GERD, multiple myeloma, BPH, inguinal hernia and is status post appendectomy, hip fracture, cardiac catheterization. He smoked in the remote past and has not smoked for several years. He has had long-term low back pain and was evaluated for pain radiating to the left groin. Electronic Signature(s) Signed: 01/30/2016 9:41:01 AM By: Christin Fudge MD, FACS Previous Signature: 01/30/2016  8:52:39 AM Version By: Christin Fudge MD, FACS Previous Signature: 01/30/2016 8:52:22 AM Version By: Christin Fudge MD, FACS Previous Signature: 01/30/2016 8:40:54 AM Version By: Christin Fudge MD, FACS Entered By: Christin Fudge on 01/30/2016 09:41:01 [name], [name] ([id number]) -------------------------------------------------------------------------------- Physical Exam Details Patient Name: [name], [name] Date of Service: 01/30/2016 8:45 AM Medical Record Number: [id number] Patient Account Number: [id number] Date of Birth/Sex: 1924-02-06 (82 y.o. Male) Treating RN: Baruch Gouty, RN, BSN, [employer] Primary Care Physician: [facility], Dossie Der Other Clinician: Referring Physician: [facility], Dossie Der Treating Physician/Extender: Frann Rider in Treatment: 0 Constitutional . Pulse regular. Respirations normal and unlabored. Afebrile. . Eyes Nonicteric. Reactive to light. Ears, Nose, Mouth, and Throat Lips, teeth, and gums WNL.Marland Kitchen Moist mucosa without lesions. Neck supple and nontender. No palpable supraclavicular or cervical adenopathy. Normal sized without goiter.  Respiratory WNL. No retractions.. Cardiovascular Pedal Pulses WNL. No clubbing, cyanosis or edema. Chest Breasts symmetical and no nipple discharge.. Breast tissue WNL, no masses, lumps, or tenderness.. Gastrointestinal (GI) Abdomen without masses or tenderness.. No liver or spleen enlargement or tenderness.. Lymphatic No adneopathy. No adenopathy. No adenopathy. Musculoskeletal Adexa without tenderness or enlargement.. Digits and nails w/o clubbing, cyanosis, infection, petechiae, ischemia, or inflammatory conditions.. Integumentary (Hair, Skin) No suspicious lesions. No crepitus or fluctuance. No peri-wound warmth or erythema. No masses.Marland Kitchen Psychiatric Judgement and insight Intact.. No evidence of depression, anxiety, or agitation.. Notes he has got multiple skin tears and the place as above and a lot of the skin has been totally denuded  and part of it is necrotic. Careful attention was given to each of these wounds and washed with saline edges of the wound trimmed and the only place I could save skin was on the left upper arm where I pulled the skin flap back and applied Steri-Strips. Rest of the wounds all the dead necrotic skin was removed and appropriate dressing done. Electronic Signature(s) Signed: 01/30/2016 9:42:05 AM By: Christin Fudge MD, FACS Cloward, Tonia Ghent ([id number]) Entered By: Christin Fudge on 01/30/2016 09:42:04 Gouge, Meziah Carlean Jews ([id number]) -------------------------------------------------------------------------------- Physician Orders Details Patient Name: [name], [name] Date of Service: 01/30/2016 8:45 AM Medical Record Number: [id number] Patient Account Number: [id number] Date of Birth/Sex: [birth year] (82 y.o. Male) Treating RN: Afful, RN, BSN, [employer] Primary Care Physician: [facility], [place] Other Clinician: Referring Physician: [facility], Dossie Der Treating Physician/Extender: Frann Rider in Treatment: 0 Verbal / Phone Orders: Yes Clinician: Afful, RN, BSN, Rita Read Back and Verified: Yes Diagnosis Coding Wound Cleansing Wound #3 Left Back o Other: - Do not remove dressings Wound #4 Left Elbow o Other: - Do not remove dressings Wound #5 Right Elbow o Other: - Do not remove dressings Wound #6 Left Knee o Other: - Do not remove dressings Skin Barriers/Peri-Wound Care Wound #3 Left Back o Skin Prep Wound #4 Left Elbow o Skin Prep Wound #5 Right Elbow o Skin Prep Wound #6 Left Knee o Skin Prep Primary Wound Dressing Wound #3 Left Back o Mepitel One Contact layer o Non-adherent pad Wound #4 Left Elbow o Mepitel One Contact layer o Non-adherent pad Wound #5 Right Elbow o Mepitel One Contact layer Vivian, Kortez L. ([id number]) o Non-adherent pad Wound #6 Left Knee o Mepitel One Contact layer o Non-adherent pad Secondary Dressing Wound #4 Left Elbow o  Tegaderm Wound #6 Left Knee o Tegaderm Wound #3 Left Back o Boardered Foam Dressing Wound #5 Right Elbow o Gauze and Kerlix/Conform Dressing Change Frequency Wound #3 Left Back o Change dressing every week Wound #4 Left Elbow o Change dressing every week Wound #5 Right Elbow o Change dressing every week Wound #6 Left Knee o Change dressing every week Follow-up Appointments Wound #3 Left Back o Return Appointment in 1 week. Wound #4 Left Elbow o Return Appointment in 1 week. Wound #5 Right Elbow o Return Appointment in 1 week. Wound #6 Left Knee o Return Appointment in 1 week. Additional Orders / Instructions Wound #3 Left Back o Activity as tolerated [name], [name] ([id number]) o Other: - KEEP ALL DRESSING IN PLACE TILL NEXT WEEK Wound #4 Left Elbow o Activity as tolerated o Other: - KEEP ALL DRESSING IN PLACE TILL NEXT WEEK Wound #5 Right Elbow o Activity as tolerated o Other: - KEEP ALL DRESSING IN PLACE TILL NEXT WEEK Wound #6 Left Knee o Activity as tolerated o Other: -  KEEP ALL DRESSING IN PLACE TILL NEXT WEEK Electronic Signature(s) Signed: 01/30/2016 3:42:21 PM By: Christin Fudge MD, FACS Signed: 01/30/2016 4:56:10 PM By: Regan Lemming BSN, RN Entered By: Regan Lemming on 01/30/2016 09:43:36 [name], [name] ([id number]) -------------------------------------------------------------------------------- Problem List Details Patient Name: [name], [name] Date of Service: 01/30/2016 8:45 AM Medical Record Number: [id number] Patient Account Number: [id number] Date of Birth/Sex: 02-11-1924 (83 y.o. Male) Treating RN: Afful, RN, BSN, [employer] Primary Care Physician: [facility], Dossie Der Other Clinician: Referring Physician: [facility], Dossie Der Treating Physician/Extender: Frann Rider in Treatment: 0 Active Problems ICD-10 Encounter Code Description Active Date Diagnosis S51.011A Laceration without foreign body of right elbow, initial 01/30/2016  Yes encounter S81.012A Laceration without foreign body, left knee, initial encounter 01/30/2016 Yes S41.112A Laceration without foreign body of left upper arm, initial 01/30/2016 Yes encounter S21.212A Laceration without foreign body of left back wall of thorax 01/30/2016 Yes without penetration into thoracic cavity, initial encounter Inactive Problems Resolved Problems Electronic Signature(s) Signed: 01/30/2016 9:37:54 AM By: Christin Fudge MD, FACS Entered By: Christin Fudge on 01/30/2016 09:37:54 [place], Damonie L. ([id number]) -------------------------------------------------------------------------------- Progress Note Details Patient Name: [name], [name] Date of Service: 01/30/2016 8:45 AM Medical Record Number: [id number] Patient Account Number: [id number] Date of Birth/Sex: Nov 27, 1923 (82 y.o. Male) Treating RN: Afful, RN, BSN, [place] Primary Care Physician: [facility], Dossie Der Other Clinician: Referring Physician: [facility], Dossie Der Treating Physician/Extender: Frann Rider in Treatment: 0 Subjective Chief Complaint Information obtained from Patient Patient presents to the wound care center for a consult due non healing wound to his right elbow, left knee, left upper arm and left upper back which she's had for about a week History of Present Illness (HPI) The following HPI elements were documented for the patient's wound: Location: right elbow, left knee, left upper arm and left upper back Quality: Patient reports experiencing a dull pain to affected area(s). Severity: Patient states wound (s) are getting better. Duration: Patient has had the wound for < 1 weeks prior to presenting for treatment Timing: Pain in wound is Intermittent (comes and goes Context: The wound occurred when the patient had a fall and hurt his back Modifying Factors: Other treatment(s) tried include:local care with Neosporin ointment Associated Signs and Symptoms: Patient reports having:scar tissue opens out every  time they do dressing 82 year old patient who was in [place] in May a couple of times for a lacerated wound which was healed over a couple of weeks. He now comes with recent injuries to his back and knees and both arms sustained after a fall. He did not seek any medical help and they use dressing material which was previously there. Past medical history significant for carotid stenosis, [name], [name], dyslipidemia, hypertension, anxiety, GERD, multiple myeloma, BPH, inguinal hernia and is status post appendectomy, hip fracture, cardiac catheterization. He smoked in the remote past and has not smoked for several years. He has had long-term low back pain and was evaluated for pain radiating to the left groin. Patient History Information obtained from Patient. Allergies baclofen, Bactrim Family History Diabetes - Father, Heart Disease - Mother, Stroke - Mother, Siblings, No family history of Cancer, Hypertension, Kidney Disease, Lung Disease, Seizures. Social History JENNER, ROSIER ([id number]) Former smoker, Marital Status - Married, Alcohol Use - Never, Drug Use - No History, Caffeine Use - Daily. Medical And Surgical History Notes Constitutional Symptoms (General Health) Carotid stenosis; [name]; Mutiple Myeloma; Lymphoma; BPH Review of Systems (ROS) Eyes The patient has no complaints or symptoms. Ear/Nose/Mouth/Throat The patient has no complaints or  symptoms. Hematologic/Lymphatic The patient has no complaints or symptoms. Respiratory The patient has no complaints or symptoms. Cardiovascular The patient has no complaints or symptoms. Gastrointestinal The patient has no complaints or symptoms. Endocrine The patient has no complaints or symptoms. Genitourinary The patient has no complaints or symptoms. Immunological The patient has no complaints or symptoms. Integumentary (Skin) Complains or has symptoms of Wounds, Breakdown, Swelling. Musculoskeletal The patient has no  complaints or symptoms. Neurologic The patient has no complaints or symptoms. Oncologic The patient has no complaints or symptoms. Psychiatric The patient has no complaints or symptoms. Medications PreserVision probiotic sertraline doxazosin 1 mg tablet oral tablet oral clonazepam 0.5 mg tablet oral tablet oral pravastatin 40 mg tablet oral tablet oral lisinopril 2.5 mg tablet oral tablet oral metoprolol tartrate 25 mg tablet oral tablet oral Miralax 17 gram oral powder packet oral powder in packet oral pantoprazole 40 mg tablet,delayed release oral tablet,delayed release (DR/EC) oral Wee, Levoy L. ([id number]) isosorbide mononitrate ER 30 mg tablet,extended release 24 hr oral tablet extended release 24 hr oral nitroglycerin 0.4 mg sublingual tablet sublingual tablet, sublingual sublingual Vitamin D3 5,000 unit tablet oral tablet oral Objective Constitutional Pulse regular. Respirations normal and unlabored. Afebrile. Vitals Time Taken: 8:46 AM, Temperature: 97.5 F, Pulse: 70 bpm, Respiratory Rate: 19 breaths/min, Blood Pressure: 118/47 mmHg. Eyes Nonicteric. Reactive to light. Ears, Nose, Mouth, and Throat Lips, teeth, and gums WNL.Marland Kitchen Moist mucosa without lesions. Neck supple and nontender. No palpable supraclavicular or cervical adenopathy. Normal sized without goiter. Respiratory WNL. No retractions.. Cardiovascular Pedal Pulses WNL. No clubbing, cyanosis or edema. Chest Breasts symmetical and no nipple discharge.. Breast tissue WNL, no masses, lumps, or tenderness.. Gastrointestinal (GI) Abdomen without masses or tenderness.. No liver or spleen enlargement or tenderness.. Lymphatic No adneopathy. No adenopathy. No adenopathy. Musculoskeletal Adexa without tenderness or enlargement.. Digits and nails w/o clubbing, cyanosis, infection, petechiae, ischemia, or inflammatory conditions.Marland Kitchen Psychiatric Judgement and insight Intact.. No evidence of depression, anxiety, or  agitation.. General Notes: he has got multiple skin tears and the place as above and a lot of the skin has been totally [name], [name] ([id number]) denuded and part of it is necrotic. Careful attention was given to each of these wounds and washed with saline edges of the wound trimmed and the only place I could save skin was on the left upper arm where I pulled the skin flap back and applied Steri-Strips. Rest of the wounds all the dead necrotic skin was removed and appropriate dressing done. Integumentary (Hair, Skin) No suspicious lesions. No crepitus or fluctuance. No peri-wound warmth or erythema. No masses.. Wound #3 status is Open. Original cause of wound was Shear/Friction. The wound is located on the Left Back. The wound measures 10cm length x 14cm width x 0.1cm depth; 109.956cm^2 area and 10.996cm^3 volume. The wound is limited to skin breakdown. There is no tunneling or undermining noted. There is a medium amount of sanguinous drainage noted. The wound margin is distinct with the outline attached to the wound base. There is medium (34-66%) red granulation within the wound bed. There is a small (1-33%) amount of necrotic tissue within the wound bed including Adherent Slough. The periwound skin appearance exhibited: Moist. The periwound skin appearance did not exhibit: Callus, Crepitus, Excoriation, Fluctuance, Friable, Induration, Localized Edema, Rash, Scarring, Dry/Scaly, Maceration, Atrophie Blanche, Cyanosis, Ecchymosis, Hemosiderin Staining, Mottled, Pallor, Rubor, Erythema. Periwound temperature was noted as No Abnormality. Wound #4 status is Open. Original cause of wound was Shear/Friction. The wound is located  on the Left Elbow. The wound measures 8.5cm length x 6cm width x 0.1cm depth; 40.055cm^2 area and 4.006cm^3 volume. The wound is limited to skin breakdown. There is no tunneling or undermining noted. There is a medium amount of sanguinous drainage noted. The wound margin  is distinct with the outline attached to the wound base. There is large (67-100%) red granulation within the wound bed. There is a small (1-33%) amount of necrotic tissue within the wound bed including Adherent Slough. The periwound skin appearance exhibited: Moist. The periwound skin appearance did not exhibit: Callus, Crepitus, Excoriation, Fluctuance, Friable, Induration, Localized Edema, Rash, Scarring, Dry/Scaly, Maceration, Atrophie Blanche, Cyanosis, Ecchymosis, Hemosiderin Staining, Mottled, Pallor, Rubor, Erythema. Periwound temperature was noted as No Abnormality. The periwound has tenderness on palpation. Wound #5 status is Open. Original cause of wound was Shear/Friction. The wound is located on the Right Elbow. The wound measures 3cm length x 2cm width x 0.1cm depth; 4.712cm^2 area and 0.471cm^3 volume. The wound is limited to skin breakdown. There is no tunneling or undermining noted. There is a medium amount of sanguinous drainage noted. The wound margin is distinct with the outline attached to the wound base. There is large (67-100%) red, pink granulation within the wound bed. There is no necrotic tissue within the wound bed. The periwound skin appearance exhibited: Moist. The periwound skin appearance did not exhibit: Callus, Crepitus, Excoriation, Fluctuance, Friable, Induration, Localized Edema, Rash, Scarring, Dry/Scaly, Maceration, Atrophie Blanche, Cyanosis, Ecchymosis, Hemosiderin Staining, Mottled, Pallor, Rubor, Erythema. Periwound temperature was noted as No Abnormality. Wound #6 status is Open. Original cause of wound was Shear/Friction. The wound is located on the Left Knee. The wound measures 2cm length x 1cm width x 0.1cm depth; 1.571cm^2 area and 0.157cm^3 volume. The wound is limited to skin breakdown. There is no tunneling or undermining noted. There is a medium amount of sanguinous drainage noted. The wound margin is distinct with the outline attached to  the wound base. There is large (67-100%) red, pink granulation within the wound bed. There is no necrotic tissue within the wound bed. The periwound skin appearance exhibited: Moist. The periwound skin appearance did not exhibit: Callus, Crepitus, Excoriation, Fluctuance, Friable, Induration, Localized Edema, Rash, Scarring, Dry/Scaly, Maceration, Atrophie Blanche, Cyanosis, Ecchymosis, Hemosiderin Staining, Mottled, Pallor, Rubor, Erythema. Periwound temperature was noted as No Abnormality. MICHAIL, BOYTE ([id number]) Assessment Active Problems ICD-10 S51.011A - Laceration without foreign body of right elbow, initial encounter S81.012A - Laceration without foreign body, left knee, initial encounter S41.112A - Laceration without foreign body of left upper arm, initial encounter S21.212A - Laceration without foreign body of left back wall of thorax without penetration into thoracic cavity, initial encounter This 82 year old gentleman has multiple lacerations with skin flaps which have been denuded and after giving careful attention to these wounds I have applied a nonadherent dressing and appropriate bolstered in place and either a bandage or Tegaderm applied. The left upper arm wound was Steri-Stripped in place and hopefully the skin flap will take. I have asked them to leave this intact and see me back next week. The patient is already taken tetanus toxoid and is up-to-date with all his other vaccinations. He and his wife have had all questions answered Procedures Wound #3 Wound #3 is a Skin Tear located on the Left Back . There was a Skin/Subcutaneous Tissue Debridement ([id number]) debridement with total area of 6 sq cm performed by Christin Fudge, MD. with the following instrument(s): Forceps and Scissors to remove Viable and Non-Viable tissue/material including  Fibrin/Slough, Skin, and Subcutaneous after achieving pain control using Lidocaine 4% Topical Solution. A time out was  conducted at 09:16, prior to the start of the procedure. A Minimum amount of bleeding was controlled with Pressure. The procedure was tolerated well with a pain level of 0 throughout and a pain level of 0 following the procedure. Post Debridement Measurements: 10cm length x 14cm width x 0.1cm depth; 10.996cm^3 volume. Character of Wound/Ulcer Post Debridement is stable. Severity of Tissue Post Debridement is: Fat layer exposed. Post procedure Diagnosis Wound #3: Same as Pre-Procedure Wound #4 Wound #4 is a Skin Tear located on the Left Elbow . There was a Skin/Subcutaneous Tissue Debridement ([id number]) debridement with total area of 20 sq cm performed by Christin Fudge, MD. with the following instrument(s): Forceps and Scissors to remove Viable and Non-Viable tissue/material including Fibrin/Slough, Skin, and Subcutaneous after achieving pain control using Lidocaine 4% Topical Solution. A time out was conducted at 09:16, prior to the start of the procedure. A Minimum amount of bleeding was controlled with Pressure. The procedure was tolerated well with a pain level of 0 throughout and a pain level Going, Kadar L. ([id number]) of 0 following the procedure. Post Debridement Measurements: 8.5cm length x 6cm width x 0.1cm depth; 4.006cm^3 volume. Character of Wound/Ulcer Post Debridement is stable. Severity of Tissue Post Debridement is: Fat layer exposed. Post procedure Diagnosis Wound #4: Same as Pre-Procedure General Notes: skin excised, pulled back in place and Steri-Stripped with appropriate nonadhering dressing placed above this. Wound #5 Wound #5 is a Skin Tear located on the Right Elbow . There was a Skin/Subcutaneous Tissue Debridement ([id number]) debridement with total area of 6 sq cm performed by Christin Fudge, MD. with the following instrument(s): Forceps and Scissors to remove Viable and Non-Viable tissue/material including Fibrin/Slough, Skin, and Subcutaneous after achieving  pain control using Lidocaine 4% Topical Solution. A time out was conducted at 09:16, prior to the start of the procedure. A Minimum amount of bleeding was controlled with Pressure. The procedure was tolerated well with a pain level of 0 throughout and a pain level of 0 following the procedure. Post Debridement Measurements: 3cm length x 2cm width x 0.1cm depth; 0.471cm^3 volume. Character of Wound/Ulcer Post Debridement is stable. Severity of Tissue Post Debridement is: Fat layer exposed. Post procedure Diagnosis Wound #5: Same as Pre-Procedure Plan Wound Cleansing: Wound #3 Left Back: Other: - Do not remove dressings Wound #4 Left Elbow: Other: - Do not remove dressings Wound #5 Right Elbow: Other: - Do not remove dressings Wound #6 Left Knee: Other: - Do not remove dressings Skin Barriers/Peri-Wound Care: Wound #3 Left Back: Skin Prep Wound #4 Left Elbow: Skin Prep Wound #5 Right Elbow: Skin Prep Wound #6 Left Knee: Skin Prep Primary Wound Dressing: Wound #3 Left Back: Mepitel One Contact layer Non-adherent pad Wlodarczyk, Eliyah L. ([id number]) Wound #4 Left Elbow: Mepitel One Contact layer Non-adherent pad Wound #5 Right Elbow: Mepitel One Contact layer Non-adherent pad Wound #6 Left Knee: Mepitel One Contact layer Non-adherent pad Secondary Dressing: Wound #4 Left Elbow: Tegaderm Wound #6 Left Knee: Tegaderm Wound #3 Left Back: Boardered Foam Dressing Wound #5 Right Elbow: Gauze and Kerlix/Conform Dressing Change Frequency: Wound #3 Left Back: Change dressing every week Wound #4 Left Elbow: Change dressing every week Wound #5 Right Elbow: Change dressing every week Wound #6 Left Knee: Change dressing every week Follow-up Appointments: Wound #3 Left Back: Return Appointment in 1 week. Wound #4 Left Elbow: Return Appointment in 1 week. Wound #  5 Right Elbow: Return Appointment in 1 week. Wound #6 Left Knee: Return Appointment in 1 week. Additional Orders  / Instructions: Wound #3 Left Back: Activity as tolerated Other: - KEEP ALL DRESSING IN PLACE TILL NEXT WEEK Wound #4 Left Elbow: Activity as tolerated Other: - KEEP ALL DRESSING IN PLACE TILL NEXT WEEK Wound #5 Right Elbow: Activity as tolerated Other: - KEEP ALL DRESSING IN PLACE TILL NEXT WEEK Wound #6 Left Knee: Activity as tolerated Other: - KEEP ALL DRESSING IN PLACE TILL NEXT WEEK RAYMAR, JOINER. ([id number]) This 82 year old gentleman has multiple lacerations with skin flaps which have been denuded and after giving careful attention to these wounds I have applied a nonadherent dressing and appropriate bolstered in place and either a bandage or Tegaderm applied. The left upper arm wound was Steri-Stripped in place and hopefully the skin flap will take. I have asked them to leave this intact and see me back next week. The patient is already taken tetanus toxoid and is up-to-date with all his other vaccinations. He and his wife have had all questions answered Electronic Signature(s) Signed: 01/30/2016 3:43:24 PM By: Christin Fudge MD, FACS Previous Signature: 01/30/2016 9:43:31 AM Version By: Christin Fudge MD, FACS Entered By: Christin Fudge on 01/30/2016 15:43:24 Naab, Shayne L. ([id number]) -------------------------------------------------------------------------------- ROS/PFSH Details Patient Name: Vandenbosch, Kemonte L. Date of Service: 01/30/2016 8:45 AM Medical Record Number: [id number] Patient Account Number: [id number] Date of Birth/Sex: September 29, 1923 (82 y.o. Male) Treating RN: Afful, RN, BSN, [employer] Primary Care Physician: [facility], Dossie Der Other Clinician: Referring Physician: [facility], Dossie Der Treating Physician/Extender: Frann Rider in Treatment: 0 Information Obtained From Patient Wound History Integumentary (Skin) Complaints and Symptoms: Positive for: Wounds; Breakdown; Swelling Medical History: Negative for: History of Burn; History of pressure wounds Constitutional Symptoms  (General Health) Medical History: Past Medical History Notes: Carotid stenosis; [name]; Mutiple Myeloma; Lymphoma; BPH Eyes Complaints and Symptoms: No Complaints or Symptoms Medical History: Positive for: Cataracts - removed Negative for: Glaucoma; Optic Neuritis Ear/Nose/Mouth/Throat Complaints and Symptoms: No Complaints or Symptoms Medical History: Positive for: Chronic sinus problems/congestion Negative for: Middle ear problems Hematologic/Lymphatic Complaints and Symptoms: No Complaints or Symptoms Medical History: Negative for: Anemia; Hemophilia; Human Immunodeficiency Virus; Lymphedema; Sickle Cell Disease Diss, Khalif L. ([id number]) Respiratory Complaints and Symptoms: No Complaints or Symptoms Medical History: Positive for: Sleep Apnea Negative for: Aspiration; Asthma; Chronic Obstructive Pulmonary Disease (COPD); Pneumothorax; Tuberculosis Cardiovascular Complaints and Symptoms: No Complaints or Symptoms Medical History: Positive for: Coronary Artery Disease - stent 3 years ago Negative for: Angina; Arrhythmia; Congestive Heart Failure; Deep Vein Thrombosis; Hypertension; Hypotension; Myocardial Infarction; Peripheral Arterial Disease; Peripheral Venous Disease; Phlebitis; Vasculitis Gastrointestinal Complaints and Symptoms: No Complaints or Symptoms Medical History: Negative for: Cirrhosis ; Colitis; Crohnos; Hepatitis A; Hepatitis B; Hepatitis C Endocrine Complaints and Symptoms: No Complaints or Symptoms Medical History: Negative for: Type I Diabetes; Type II Diabetes Genitourinary Complaints and Symptoms: No Complaints or Symptoms Medical History: Negative for: End Stage Renal Disease Immunological Complaints and Symptoms: No Complaints or Symptoms Medical History: Arthurs, Armani L. ([id number]) Negative for: Lupus Erythematosus; Raynaudos; Scleroderma Musculoskeletal Complaints and Symptoms: No Complaints or Symptoms Medical History: Positive  for: Osteoarthritis - back Negative for: Gout; Rheumatoid Arthritis; Osteomyelitis Neurologic Complaints and Symptoms: No Complaints or Symptoms Medical History: Negative for: Dementia; Neuropathy; Quadriplegia; Paraplegia; Seizure Disorder Oncologic Complaints and Symptoms: No Complaints or Symptoms Medical History: Positive for: Received Chemotherapy Negative for: Received Radiation Psychiatric Complaints and Symptoms: No Complaints or Symptoms Medical History: Negative for: Anorexia/bulimia HBO Extended  History Items Eyes: Ear/Nose/Mouth/Throat: Cataracts Chronic sinus problems/congestion Family and Social History Cancer: No; Diabetes: Yes - Father; Heart Disease: Yes - Mother; Hypertension: No; Kidney Disease: No; Lung Disease: No; Seizures: No; Stroke: Yes - Mother, Siblings; Former smoker; Marital Status - Married; Alcohol Use: Never; Drug Use: No History; Caffeine Use: Daily; Advanced Directives: Yes (Not Provided); Patient does not want information on Advanced Directives; Living Will: Yes (Not Provided) Physician Affirmation I have reviewed and agree with the above information. Electronic Signature(s) Signed: 01/30/2016 3:42:21 PM By: Christin Fudge MD, FACS NITIN, MCKOWEN ([id number]) Signed: 01/30/2016 4:56:10 PM By: Regan Lemming BSN, RN Entered By: Christin Fudge on 01/30/2016 09:36:01 Wooden, Tremond L. ([id number]) -------------------------------------------------------------------------------- SuperBill Details Patient Name: Flicker, Virgil L. Date of Service: 01/30/2016 Medical Record Number: [id number] Patient Account Number: [id number] Date of Birth/Sex: [birth year] (82 y.o. Male) Treating RN: Afful, RN, BSN, [employer] Primary Care Physician: [facility], Dossie Der Other Clinician: Referring Physician: [facility], Dossie Der Treating Physician/Extender: Frann Rider in Treatment: 0 Diagnosis Coding ICD-10 Codes Code Description K81.275T Laceration without foreign body of right elbow,  initial encounter S81.012A Laceration without foreign body, left knee, initial encounter S41.112A Laceration without foreign body of left upper arm, initial encounter Laceration without foreign body of left back wall of thorax without penetration into thoracic S21.212A cavity, initial encounter Facility Procedures CPT4: Description Modifier Quantity Code [id number] 99213 - WOUND CARE VISIT-LEV 3 EST PT 1 CPT4: 49675916 11042 - DEB SUBQ TISSUE 20 SQ CM/< 1 ICD-10 Description Diagnosis S51.011A Laceration without foreign body of right elbow, initial encounter S81.012A Laceration without foreign body, left knee, initial encounter S41.112A Laceration without  foreign body of left upper arm, initial encounter S21.212A Laceration without foreign body of left back wall of thorax without penetration into thoracic cavity, initial encounter CPT4: 38466599 11045 - DEB SUBQ TISS EA ADDL 20CM 1 ICD-10 Description Diagnosis S51.011A Laceration without foreign body of right elbow, initial encounter S81.012A Laceration without foreign body, left knee, initial encounter S41.112A Laceration without  foreign body of left upper arm, initial encounter S21.212A Laceration without foreign body of left back wall of thorax without penetration into thoracic cavity, initial encounter Physician Procedures CPT4: Description Modifier Quantity Code [id number] 99214 - WC PHYS LEVEL 4 - EST PT 25 1 Creegan, Timmothy L. ([id number]) Electronic Signature(s) Signed: 01/30/2016 3:43:38 PM By: Christin Fudge MD, FACS Previous Signature: 01/30/2016 12:10:10 PM Version By: Regan Lemming BSN, RN Previous Signature: 01/30/2016 3:42:21 PM Version By: Christin Fudge MD, FACS Previous Signature: 01/30/2016 9:44:04 AM Version By: Christin Fudge MD, FACS Entered By: Christin Fudge on 01/30/2016 15:43:38

## 2016-02-05 ENCOUNTER — Other Ambulatory Visit: Payer: Self-pay | Admitting: Emergency Medicine

## 2016-02-05 DIAGNOSIS — K219 Gastro-esophageal reflux disease without esophagitis: Secondary | ICD-10-CM

## 2016-02-05 MED ORDER — PANTOPRAZOLE SODIUM 40 MG PO TBEC: 40.0000 mg | DELAYED_RELEASE_TABLET | Freq: Every day | ORAL | 0 refills | Status: DC

## 2016-02-06 ENCOUNTER — Encounter: Payer: Medicare Other | Admitting: Nurse Practitioner

## 2016-02-06 ENCOUNTER — Other Ambulatory Visit: Payer: Self-pay | Admitting: Family Medicine

## 2016-02-06 DIAGNOSIS — I2510 Atherosclerotic heart disease of native coronary artery without angina pectoris: Secondary | ICD-10-CM

## 2016-02-06 DIAGNOSIS — S51011A Laceration without foreign body of right elbow, initial encounter: Secondary | ICD-10-CM | POA: Diagnosis not present

## 2016-02-06 NOTE — Telephone Encounter (Signed)
Posey Pronto (wife) is requesting refill on metoprolol. Patient is completely out. Please send to [email or web address].

## 2016-02-07 NOTE — Progress Notes (Signed)
KHALIQ, TURAY ([id number]) Visit Report for 02/06/2016 Chief Complaint Document Details Patient Name: [name], [name] Date of Service: 02/06/2016 9:00 AM Medical Record Number: [id number] Patient Account Number: [id number] Date of Birth/Sex: 28-Jul-1923 (82 y.o. Male) Treating RN: Afful, RN, BSN, [employer] Primary Care Physician: [facility], Dossie Der Other Clinician: Referring Physician: Keith Rake Treating Physician/Extender: Cathie Olden in Treatment: 1 Information Obtained from: Patient Chief Complaint Patient presents to the wound care center for a consult due non healing wound to his right elbow, left knee, left upper arm and left upper back which she's had for about a week Electronic Signature(s) Signed: 02/06/2016 12:07:50 PM By: Rene Kocher, NP, Tish Begin Entered By: Rene Kocher, NP, Felipe Cabell on 02/06/2016 12:07:50 [name], [name] ([id number]) -------------------------------------------------------------------------------- HPI Details Patient Name: [name], [name] Date of Service: 02/06/2016 9:00 AM Medical Record Number: [id number] Patient Account Number: [id number] Date of Birth/Sex: 09-07-1923 (82 y.o. Male) Treating RN: Afful, RN, BSN, Velva Harman Primary Care Physician: [facility], Dossie Der Other Clinician: Referring Physician: [facility], Dossie Der Treating Physician/Extender: Cathie Olden in Treatment: 1 History of Present Illness Location: right elbow, left knee, left upper arm and left upper back Quality: Patient reports experiencing a dull pain to affected area(s). Severity: Patient states wound (s) are getting better. Duration: Patient has had the wound for < 1 weeks prior to presenting for treatment Timing: Pain in wound is Intermittent (comes and goes Context: The wound occurred when the patient had a fall and hurt his back Modifying Factors: Other treatment(s) tried include:local care with Neosporin ointment Associated Signs and Symptoms: Patient reports having:scar tissue opens out every time they  do dressing HPI Description: 82 year old patient who was in [place] in May a couple of times for a lacerated wound which was healed over a couple of weeks. He now comes with recent injuries to his back and knees and both arms sustained after a fall. He did not seek any medical help and they use dressing material which was previously there. Past medical history significant for carotid stenosis, [name], [name], dyslipidemia, hypertension, anxiety, GERD, multiple myeloma, BPH, inguinal hernia and is status post appendectomy, hip fracture, cardiac catheterization. He smoked in the remote past and has not smoked for several years. He has had long-term low back pain and was evaluated for pain radiating to the left groin. 02-06-16 [name] progress for evaluation of multiple skin tears, he is accompanied by his wife. Neither patient nor his wife expressed the concerns regarding his wounds and/or the treatment plan. They deny any falls since his last appointment or any new injuries. Electronic Signature(s) Signed: 02/06/2016 12:09:28 PM By: Rene Kocher, NP, Jana Hakim By: Rene Kocher, NP, [place] Winslow on 02/06/2016 12:09:28 [name] ([id number]) -------------------------------------------------------------------------------- Physical [name] Details Patient Name: [name], [name] Date of Service: 02/06/2016 9:00 AM Medical Record Number: [id number] Patient Account Number: [id number] Date of Birth/Sex: 1923/06/06 (82 y.o. Male) Treating RN: Baruch Gouty, RN, BSN, [employer] Primary Care Physician: [facility], Dossie Der Other Clinician: Referring Physician: [facility], Dossie Der Treating Physician/Extender: Cathie Olden in Treatment: 1 Constitutional BP within normal limits. afebrile. well nourished; well developed;Marland Kitchen Respiratory non-labored respiratory effort. Integumentary (Hair, Skin) areas to left scapular region and left knee appeared to have epithelialized; right elbow, left proximal/lateral scapular region remain superficial;  no signs and symptoms of stabilizer or infection to any areas. no induration, no fluctuance, denies pain. Psychiatric oriented to time, place, person and situation. calm, pleasant, conversive. Electronic Signature(s) Signed: 02/06/2016 12:14:46 PM By: Rene Kocher, NP, Jana Hakim By: Rene Kocher, NP, [name] on 02/06/2016 12:14:46 [name],  [name] ([id number]) -------------------------------------------------------------------------------- Physician Orders Details Patient Name: [name], [name] Date of Service: 02/06/2016 9:00 AM Medical Record Number: [id number] Patient Account Number: [id number] Date of Birth/Sex: [birth year] (82 y.o. Male) Treating RN: Afful, RN, BSN, [employer] Primary Care Physician: [facility], Dossie Der Other Clinician: Referring Physician: [facility], Dossie Der Treating Physician/Extender: Cathie Olden in Treatment: 1 Verbal / Phone Orders: Yes Clinician: Afful, RN, BSN, Rita Read Back and Verified: Yes Diagnosis Coding Wound Cleansing Wound #3 Left Back o Cleanse wound with mild soap and water - only in the clinic Wound #4 Left Elbow o Cleanse wound with mild soap and water - only in the clinic Wound #5 Right Elbow o Cleanse wound with mild soap and water - only in the clinic Wound #6 Left Knee o Cleanse wound with mild soap and water - only in the clinic Skin Barriers/Peri-Wound Care Wound #3 Left Back o Skin Prep - only in clinic Wound #4 Left Elbow o Skin Prep - only in clinic Wound #5 Right Elbow o Skin Prep - only in clinic Wound #6 Left Knee o Skin Prep - only in clinic Primary Wound Dressing Wound #3 Left Back o Mepitel One Contact layer o Boardered Foam Dressing o Non-adherent pad Wound #4 Left Elbow o Mepitel One Contact layer o Non-adherent pad Wound #5 Right Elbow [name], [name] ([id number]) o Mepitel One Contact layer o Non-adherent pad Wound #6 Left Knee o Mepitel One Contact layer o Non-adherent pad Secondary Dressing Wound #4  Left Elbow o Kerlix and Coban Wound #5 Right Elbow o Kerlix and Coban Wound #6 Left Knee o Kerlix and Coban Dressing Change Frequency Wound #3 Left Back o Change dressing every week - In Clinic Wound #4 Left Elbow o Change dressing every week - In Clinic Wound #5 Right Elbow o Change dressing every week - In Clinic Wound #6 Left Knee o Change dressing every week - In Clinic Follow-up Appointments Wound #3 Left Back o Return Appointment in 1 week. Wound #4 Left Elbow o Return Appointment in 1 week. Wound #5 Right Elbow o Return Appointment in 1 week. Wound #6 Left Knee o Return Appointment in 1 week. Electronic Signature(s) Signed: 02/06/2016 12:15:28 PM By: Rene Kocher, NP, Babygirl Trager Previous Signature: 02/06/2016 12:15:05 PM Version By: Rene Kocher, NP, Joanette Gula, Kasey Carlean Jews ([id number]) Entered By: Rene Kocher, NP, Dellar Brandon on 02/06/2016 12:15:27 [name], [name] ([id number]) -------------------------------------------------------------------------------- Problem List Details Patient Name: Peerson, Kedarius L. Date of Service: 02/06/2016 9:00 AM Medical Record Number: [id number] Patient Account Number: [id number] Date of Birth/Sex: July 27, 1923 (82 y.o. Male) Treating RN: Afful, RN, BSN, [employer] Primary Care Physician: [facility], Dossie Der Other Clinician: Referring Physician: Keith Rake Treating Physician/Extender: Cathie Olden in Treatment: 1 Active Problems ICD-10 Encounter Code Description Active Date Diagnosis S51.011A Laceration without foreign body of right elbow, initial 01/30/2016 Yes encounter S81.012A Laceration without foreign body, left knee, initial encounter 01/30/2016 Yes S41.112A Laceration without foreign body of left upper arm, initial 01/30/2016 Yes encounter S21.212A Laceration without foreign body of left back wall of thorax 01/30/2016 Yes without penetration into thoracic cavity, initial encounter Inactive Problems Resolved Problems Electronic  Signature(s) Signed: 02/06/2016 12:07:30 PM By: Rene Kocher, NP, Debar Plate Entered By: Rene Kocher, NP, Hjalmar Ballengee on 02/06/2016 12:07:30 [place], Iker L. ([id number]) -------------------------------------------------------------------------------- Progress Note Details Patient Name: Dyas, Undra L. Date of Service: 02/06/2016 9:00 AM Medical Record Number: [id number] Patient Account Number: [id number] Date of Birth/Sex: May 11, 1923 (82 y.o. Male) Treating RN: Afful, RN, BSN, [employer] Primary Care Physician: [facility], Dossie Der Other Clinician: Referring Physician: [facility],  [facility] Treating Physician/Extender: Cathie Olden in Treatment: 1 Subjective Chief Complaint Information obtained from Patient Patient presents to the wound care center for a consult due non healing wound to his right elbow, left knee, left upper arm and left upper back which she's had for about a week History of Present Illness (HPI) The following HPI elements were documented for the patient's wound: Location: right elbow, left knee, left upper arm and left upper back Quality: Patient reports experiencing a dull pain to affected area(s). Severity: Patient states wound (s) are getting better. Duration: Patient has had the wound for < 1 weeks prior to presenting for treatment Timing: Pain in wound is Intermittent (comes and goes Context: The wound occurred when the patient had a fall and hurt his back Modifying Factors: Other treatment(s) tried include:local care with Neosporin ointment Associated Signs and Symptoms: Patient reports having:scar tissue opens out every time they do dressing 82 year old patient who was in [facility] in May a couple of times for a lacerated wound which was healed over a couple of weeks. He now comes with recent injuries to his back and knees and both arms sustained after a fall. He did not seek any medical help and they use dressing material which was previously there. Past medical history significant for carotid stenosis,  [name], [name], dyslipidemia, hypertension, anxiety, GERD, multiple myeloma, BPH, inguinal hernia and is status post appendectomy, hip fracture, cardiac catheterization. He smoked in the remote past and has not smoked for several years. He has had long-term low back pain and was evaluated for pain radiating to the left groin. 02-06-16 Mr. Fitzgerald progress for evaluation of multiple skin tears, he is accompanied by his wife. Neither patient nor his wife expressed the concerns regarding his wounds and/or the treatment plan. They deny any falls since his last appointment or any new injuries. Objective Constitutional BP within normal limits. afebrile. well nourished; well developed;. [name], [name] ([id number]) Vitals Time Taken: 8:56 AM, Temperature: 97.4 F, Pulse: 66 bpm, Respiratory Rate: 19 breaths/min, Blood Pressure: 142/67 mmHg. Respiratory non-labored respiratory effort. Psychiatric oriented to time, place, person and situation. calm, pleasant, conversive. Integumentary (Hair, Skin) areas to left scapular region and left knee appeared to have epithelialized; right elbow, left proximal/lateral scapular region remain superficial; no signs and symptoms of stabilizer or infection to any areas. no induration, no fluctuance, denies pain. Wound #3 status is Open. Original cause of wound was Shear/Friction. The wound is located on the Left Back. The wound measures 9cm length x 10cm width x 0.1cm depth; 70.686cm^2 area and 7.069cm^3 volume. The wound is limited to skin breakdown. There is no tunneling or undermining noted. There is a small amount of sanguinous drainage noted. The wound margin is distinct with the outline attached to the wound base. There is large (67-100%) red, friable granulation within the wound bed. There is a small (1-33%) amount of necrotic tissue within the wound bed including Eschar and Adherent Slough. The periwound skin appearance exhibited: Dry/Scaly, Moist. The  periwound skin appearance did not exhibit: Callus, Crepitus, Excoriation, Fluctuance, Friable, Induration, Localized Edema, Rash, Scarring, Maceration, Atrophie Blanche, Cyanosis, Ecchymosis, Hemosiderin Staining, Mottled, Pallor, Rubor, Erythema. Periwound temperature was noted as No Abnormality. Wound #4 status is Open. Original cause of wound was Shear/Friction. The wound is located on the Left Elbow. The wound measures 8cm length x 3cm width x 0.1cm depth; 18.85cm^2 area and 1.885cm^3 volume. The wound is limited to skin breakdown. There is no tunneling or undermining noted. There  is a medium amount of sanguinous drainage noted. The wound margin is distinct with the outline attached to the wound base. There is large (67-100%) red, friable granulation within the wound bed. There is a small (1-33%) amount of necrotic tissue within the wound bed including Adherent Slough. The periwound skin appearance exhibited: Moist. The periwound skin appearance did not exhibit: Callus, Crepitus, Excoriation, Fluctuance, Friable, Induration, Localized Edema, Rash, Scarring, Dry/Scaly, Maceration, Atrophie Blanche, Cyanosis, Ecchymosis, Hemosiderin Staining, Mottled, Pallor, Rubor, Erythema. Periwound temperature was noted as No Abnormality. Wound #5 status is Open. Original cause of wound was Shear/Friction. The wound is located on the Right Elbow. The wound measures 2cm length x 2cm width x 0.1cm depth; 3.142cm^2 area and 0.314cm^3 volume. The wound is limited to skin breakdown. There is no tunneling or undermining noted. There is a medium amount of serosanguineous drainage noted. The wound margin is distinct with the outline attached to the wound base. There is large (67-100%) red, pink, friable granulation within the wound bed. There is no necrotic tissue within the wound bed. The periwound skin appearance exhibited: Moist. The periwound skin appearance did not exhibit: Callus, Crepitus, Excoriation,  Fluctuance, Friable, Induration, Localized Edema, Rash, Scarring, Dry/Scaly, Maceration, Atrophie Blanche, Cyanosis, Ecchymosis, Hemosiderin Staining, Mottled, Pallor, Rubor, Erythema. Periwound temperature was noted as No Abnormality. Wound #6 status is Open. Original cause of wound was Shear/Friction. The wound is located on the Left Lambe, Rodrigues L. ([id number]) Knee. The wound measures 1.3cm length x 1cm width x 0.1cm depth; 1.021cm^2 area and 0.102cm^3 volume. The wound is limited to skin breakdown. There is no tunneling or undermining noted. There is a medium amount of serosanguineous drainage noted. The wound margin is distinct with the outline attached to the wound base. There is large (67-100%) red, pink, friable granulation within the wound bed. There is no necrotic tissue within the wound bed. The periwound skin appearance exhibited: Moist. The periwound skin appearance did not exhibit: Callus, Crepitus, Excoriation, Fluctuance, Friable, Induration, Localized Edema, Rash, Scarring, Dry/Scaly, Maceration, Atrophie Blanche, Cyanosis, Ecchymosis, Hemosiderin Staining, Mottled, Pallor, Rubor, Erythema. Periwound temperature was noted as No Abnormality. Assessment Active Problems ICD-10 S51.011A - Laceration without foreign body of right elbow, initial encounter S81.012A - Laceration without foreign body, left knee, initial encounter S41.112A - Laceration without foreign body of left upper arm, initial encounter S21.212A - Laceration without foreign body of left back wall of thorax without penetration into thoracic cavity, initial encounter Plan Wound Cleansing: Wound #3 Left Back: Cleanse wound with mild soap and water - only in the clinic Wound #4 Left Elbow: Cleanse wound with mild soap and water - only in the clinic Wound #5 Right Elbow: Cleanse wound with mild soap and water - only in the clinic Wound #6 Left Knee: Cleanse wound with mild soap and water - only in the  clinic Skin Barriers/Peri-Wound Care: Wound #3 Left Back: Skin Prep - only in clinic Wound #4 Left Elbow: Skin Prep - only in clinic Wound #5 Right Elbow: Skin Prep - only in clinic Wound #6 Left Knee: Skin Prep - only in clinic Primary Wound Dressing: Wound #3 Left Back: Gahm, Donney L. ([id number]) Mepitel One Contact layer Boardered Foam Dressing Non-adherent pad Wound #4 Left Elbow: Mepitel One Contact layer Non-adherent pad Wound #5 Right Elbow: Mepitel One Contact layer Non-adherent pad Wound #6 Left Knee: Mepitel One Contact layer Non-adherent pad Secondary Dressing: Wound #4 Left Elbow: Kerlix and Coban Wound #5 Right Elbow: Kerlix and Coban Wound #6 Left Knee:  Kerlix and Coban Dressing Change Frequency: Wound #3 Left Back: Change dressing every week - In Clinic Wound #4 Left Elbow: Change dressing every week - In Clinic Wound #5 Right Elbow: Change dressing every week - In Clinic Wound #6 Left Knee: Change dressing every week - In Clinic Follow-up Appointments: Wound #3 Left Back: Return Appointment in 1 week. Wound #4 Left Elbow: Return Appointment in 1 week. Wound #5 Right Elbow: Return Appointment in 1 week. Wound #6 Left Knee: Return Appointment in 1 week. Follow-Up Appointments: A Patient Clinical Summary of Care was provided to AS 1. Continue with Mepitel, nonadherent pad to all areas Paul, Ugonna L. ([id number]) 2. Follow-up next week Electronic Signature(s) Signed: 02/06/2016 12:16:32 PM By: Rene Kocher, NP, Savera Donson Entered By: Rene Kocher, NP, Devina Bezold on 02/06/2016 12:16:32 [place], Johnavon L. ([id number]) -------------------------------------------------------------------------------- SuperBill Details Patient Name: Cruces, Tristan L. Date of Service: 02/06/2016 Medical Record Number: [id number] Patient Account Number: [id number] Date of Birth/Sex: [birth year] (82 y.o. Male) Treating RN: Afful, RN, BSN, [place] Primary Care Physician: [facility], Dossie Der Other  Clinician: Referring Physician: [facility], Dossie Der Treating Physician/Extender: Cathie Olden in Treatment: 1 Diagnosis Coding ICD-10 Codes Code Description D31.438O Laceration without foreign body of right elbow, initial encounter S81.012A Laceration without foreign body, left knee, initial encounter S41.112A Laceration without foreign body of left upper arm, initial encounter Laceration without foreign body of left back wall of thorax without penetration into thoracic S21.212A cavity, initial encounter Facility Procedures CPT4 Code: [id number] Description: [place] VISIT-LEV 4 EST PT Modifier: Quantity: 1 Physician Procedures CPT4: Description Modifier Quantity Code [id number] - WC PHYS LEVEL 3 - EST PT 1 ICD-10 Description Diagnosis S51.011A Laceration without foreign body of right elbow, initial encounter S41.112A Laceration without foreign body of left upper arm,  initial encounter S21.212A Laceration without foreign body of left back wall of thorax without penetration into thoracic cavity, initial encounter S81.012A Laceration without foreign body, left knee, initial encounter Electronic Signature(s) Signed: 02/06/2016 12:16:50 PM By: Rene Kocher, NP, [place] Entered By: Rene Kocher, NP, Terrisa Curfman on 02/06/2016 12:16:49

## 2016-02-07 NOTE — Telephone Encounter (Signed)
Medication has been refilled and sent to [facility] [address] 

## 2016-02-07 NOTE — Progress Notes (Signed)
SHAZIL, HILAIRE ([id number]) Visit Report for 02/06/2016 Arrival Information Details Patient Name: [name], [name] Date of Service: 02/06/2016 9:00 AM Medical Record Number: [id number] Patient Account Number: [id number] Date of Birth/Sex: 04/01/1923 (82 y.o. Male) Treating RN: Afful, RN, BSN, [employer] Primary Care Physician: [facility], Dossie Der Other Clinician: Referring Physician: Keith Rake Treating Physician/Extender: Cathie Olden in Treatment: 1 Visit Information History Since Last Visit All ordered tests and consults were completed: No Patient Arrived: Kasandra Knudsen Added or deleted any medications: No Arrival Time: 08:53 Any new allergies or adverse reactions: No Accompanied By: wife Had a fall or experienced change in No Transfer Assistance: None activities of daily living that may affect Patient Identification Verified: Yes risk of falls: Secondary Verification Process Completed: Yes Signs or symptoms of abuse/neglect since last No Patient Requires Transmission-Based No visito Precautions: Hospitalized since last visit: No Patient Has Alerts: No Has Dressing in Place as Prescribed: Yes Pain Present Now: No Electronic Signature(s) Signed: 02/06/2016 5:18:59 PM By: Regan Lemming BSN, RN Entered By: Regan Lemming on 02/06/2016 08:56:27 [name], [name] ([id number]) -------------------------------------------------------------------------------- Clinic Level of Care Assessment Details Patient Name: [name], [name] Date of Service: 02/06/2016 9:00 AM Medical Record Number: [id number] Patient Account Number: [id number] Date of Birth/Sex: 03-30-1923 (82 y.o. Male) Treating RN: Afful, RN, BSN, [employer] Primary Care Physician: [facility], Dossie Der Other Clinician: Referring Physician: [facility], Dossie Der Treating Physician/Extender: Cathie Olden in Treatment: 1 Clinic Level of Care Assessment Items TOOL 4 Quantity Score []  - Use when only an EandM is performed on FOLLOW-UP visit 0 ASSESSMENTS - Nursing  Assessment / Reassessment X - Reassessment of Co-morbidities (includes updates in patient status) 1 10 X - Reassessment of Adherence to Treatment Plan 1 5 ASSESSMENTS - Wound and Skin Assessment / Reassessment X - Simple Wound Assessment / Reassessment - one wound 1 5 []  - Complex Wound Assessment / Reassessment - multiple wounds 0 []  - Dermatologic / Skin Assessment (not related to wound area) 0 ASSESSMENTS - Focused Assessment []  - Circumferential Edema Measurements - multi extremities 0 []  - Nutritional Assessment / Counseling / Intervention 0 X - Lower Extremity Assessment (monofilament, tuning fork, pulses) 1 5 []  - Peripheral Arterial Disease Assessment (using hand held doppler) 0 ASSESSMENTS - Ostomy and/or Continence Assessment and Care []  - Incontinence Assessment and Management 0 []  - Ostomy Care Assessment and Management (repouching, etc.) 0 PROCESS - Coordination of Care X - Simple Patient / Family Education for ongoing care 1 15 []  - Complex (extensive) Patient / Family Education for ongoing care 0 []  - Staff obtains Programmer, systems, Records, Test Results / Process Orders 0 []  - Staff telephones HHA, Nursing Homes / Clarify orders / etc 0 []  - Routine Transfer to another Facility (non-emergent condition) 0 [name], [name] ([id number]) []  - Routine Hospital Admission (non-emergent condition) 0 []  - New Admissions / Biomedical engineer / Ordering NPWT, Apligraf, etc. 0 []  - Emergency Hospital Admission (emergent condition) 0 []  - Simple Discharge Coordination 0 []  - Complex (extensive) Discharge Coordination 0 PROCESS - Special Needs []  - Pediatric / Minor Patient Management 0 []  - Isolation Patient Management 0 []  - Hearing / Language / Visual special needs 0 []  - Assessment of Community assistance (transportation, D/C planning, etc.) 0 []  - Additional assistance / Altered mentation 0 []  - Support Surface(s) Assessment (bed, cushion, seat, etc.) 0 INTERVENTIONS - Wound  Cleansing / Measurement []  - Simple Wound Cleansing - one wound 0 X - Complex Wound Cleansing - multiple wounds 5 5 []  -  Wound Imaging (photographs - any number of wounds) 0 []  - Wound Tracing (instead of photographs) 0 []  - Simple Wound Measurement - one wound 0 X - Complex Wound Measurement - multiple wounds 5 5 INTERVENTIONS - Wound Dressings X - Small Wound Dressing one or multiple wounds 5 10 []  - Medium Wound Dressing one or multiple wounds 0 []  - Large Wound Dressing one or multiple wounds 0 []  - Application of Medications - topical 0 []  - Application of Medications - injection 0 INTERVENTIONS - Miscellaneous []  - External ear exam 0 [name], [name] ([id number]) []  - Specimen Collection (cultures, biopsies, blood, body fluids, etc.) 0 []  - Specimen(s) / Culture(s) sent or taken to Lab for analysis 0 []  - Patient Transfer (multiple staff / Harrel Lemon Lift / Similar devices) 0 []  - Simple Staple / Suture removal (25 or less) 0 []  - Complex Staple / Suture removal (26 or more) 0 []  - Hypo / Hyperglycemic Management (close monitor of Blood Glucose) 0 []  - Ankle / Brachial Index (ABI) - do not check if billed separately 0 X - Vital Signs 1 5 Has the patient been seen at the hospital within the last three years: Yes Total Score: 145 Level Of Care: New/Established - Level 4 Electronic Signature(s) Signed: 02/06/2016 5:18:59 PM By: Regan Lemming BSN, RN Entered By: Regan Lemming on 02/06/2016 10:26:57 [name], [name] ([id number]) -------------------------------------------------------------------------------- Encounter Discharge Information Details Patient Name: [name], [name] Date of Service: 02/06/2016 9:00 AM Medical Record Number: [id number] Patient Account Number: [id number] Date of Birth/Sex: Apr 16, 1923 (82 y.o. Male) Treating RN: Afful, RN, BSN, Velva Harman Primary Care Physician: [facility], Dossie Der Other Clinician: Referring Physician: Keith Rake Treating Physician/Extender: Cathie Olden in  Treatment: 1 Encounter Discharge Information Items Discharge Pain Level: 0 Discharge Condition: Stable Ambulatory Status: Cane Discharge Destination: Home Transportation: Private Auto Accompanied By: wife Schedule Follow-up Appointment: No Medication Reconciliation completed and provided to Patient/Care No Shiquan Mathieu: Provided on Clinical Summary of Care: 02/06/2016 Form Type Recipient Paper Patient AS Electronic Signature(s) Signed: 02/06/2016 11:00:49 AM By: Regan Lemming BSN, RN Previous Signature: 02/06/2016 10:27:20 AM Version By: Ruthine Dose Entered By: Regan Lemming on 02/06/2016 11:00:49 Morrill, Delshon L. ([id number]) -------------------------------------------------------------------------------- Lower Extremity Assessment Details Patient Name: [name], [name] Date of Service: 02/06/2016 9:00 AM Medical Record Number: [id number] Patient Account Number: [id number] Date of Birth/Sex: 01/23/1924 (82 y.o. Male) Treating RN: Afful, RN, BSN, [employer] Primary Care Physician: [facility], Dossie Der Other Clinician: Referring Physician: [facility], Dossie Der Treating Physician/Extender: Cathie Olden in Treatment: 1 Electronic Signature(s) Signed: 02/06/2016 5:18:59 PM By: Regan Lemming BSN, RN Entered By: Regan Lemming on 02/06/2016 08:57:26 [place], Elton L. ([id number]) -------------------------------------------------------------------------------- Multi Wound Chart Details Patient Name: [name], [name] Date of Service: 02/06/2016 9:00 AM Medical Record Number: [id number] Patient Account Number: [id number] Date of Birth/Sex: 08-Apr-1923 (82 y.o. Male) Treating RN: Afful, RN, BSN, [employer] Primary Care Physician: [facility], Dossie Der Other Clinician: Referring Physician: [facility], Dossie Der Treating Physician/Extender: Cathie Olden in Treatment: 1 Vital Signs Height(in): Pulse(bpm): 66 Weight(lbs): Blood Pressure 142/67 (mmHg): Body Mass Index(BMI): Temperature(F): 97.4 Respiratory Rate 19 (breaths/min): Photos:  [3:No Photos] [4:No Photos] [5:No Photos] Wound Location: [3:Left Back] [4:Left Elbow] [5:Right Elbow] Wounding Event: [3:Shear/Friction] [4:Shear/Friction] [5:Shear/Friction] Primary Etiology: [3:Skin Tear] [4:Skin Tear] [5:Skin Tear] Comorbid History: [3:Cataracts, Chronic sinus problems/congestion, Sleep Apnea, Coronary Artery Disease, Osteoarthritis, Received Chemotherapy] [4:Cataracts, Chronic sinus problems/congestion, Sleep Apnea, Coronary Artery Disease, Osteoarthritis,  Received Chemotherapy] [5:Cataracts, Chronic sinus problems/congestion, Sleep Apnea, Coronary Artery Disease, Osteoarthritis, Received Chemotherapy] Date Acquired: [3:01/25/2016] [4:01/25/2016] [  5:01/25/2016] Weeks of Treatment: [3:1] [4:1] [5:1] Wound Status: [3:Open] [4:Open] [5:Open] Measurements L x W x D 9x10x0.1 [4:8x3x0.1] [5:2x2x0.1] (cm) Area (cm) : [3:70.686] [4:18.85] [5:3.142] Volume (cm) : [3:7.069] [4:1.885] [5:0.314] % Reduction in Area: [3:35.70%] [4:52.90%] [5:33.30%] % Reduction in Volume: 35.70% [4:52.90%] [5:33.30%] Classification: [3:Partial Thickness] [4:Partial Thickness] [5:Partial Thickness] Exudate Amount: [3:Small] [4:Medium] [5:Medium] Exudate Type: [3:Sanguinous] [4:Sanguinous] [5:Serosanguineous] Exudate Color: [3:red] [4:red] [5:red, brown] Wound Margin: [3:Distinct, outline attached] [4:Distinct, outline attached] [5:Distinct, outline attached] Granulation Amount: [3:Large (67-100%)] [4:Large (67-100%)] [5:Large (67-100%)] Granulation Quality: [3:Red, Friable] [4:Red, Friable] [5:Red, Pink, Friable] Necrotic Amount: [3:Small (1-33%)] [4:Small (1-33%)] [5:None Present (0%)] Necrotic Tissue: [3:Eschar, Adherent Slough] [4:Adherent Slough] [5:N/A] Exposed Structures: [3:Fascia: No Fat: No] [4:Fascia: No Fat: No] [5:Fascia: No Fat: No] Tendon: No Tendon: No Tendon: No Muscle: No Muscle: No Muscle: No Joint: No Joint: No Joint: No Bone: No Bone: No Bone: No Limited to Skin  Limited to Skin Limited to Skin Breakdown Breakdown Breakdown Epithelialization: Large (67-100%) Small (1-33%) Medium (34-66%) Periwound Skin Texture: Edema: No Edema: No Edema: No Excoriation: No Excoriation: No Excoriation: No Induration: No Induration: No Induration: No Callus: No Callus: No Callus: No Crepitus: No Crepitus: No Crepitus: No Fluctuance: No Fluctuance: No Fluctuance: No Friable: No Friable: No Friable: No Rash: No Rash: No Rash: No Scarring: No Scarring: No Scarring: No Periwound Skin Moist: Yes Moist: Yes Moist: Yes Moisture: Dry/Scaly: Yes Maceration: No Maceration: No Maceration: No Dry/Scaly: No Dry/Scaly: No Periwound Skin Color: Atrophie Blanche: No Atrophie Blanche: No Atrophie Blanche: No Cyanosis: No Cyanosis: No Cyanosis: No Ecchymosis: No Ecchymosis: No Ecchymosis: No Erythema: No Erythema: No Erythema: No Hemosiderin Staining: No Hemosiderin Staining: No Hemosiderin Staining: No Mottled: No Mottled: No Mottled: No Pallor: No Pallor: No Pallor: No Rubor: No Rubor: No Rubor: No Temperature: No Abnormality No Abnormality No Abnormality Tenderness on No No No Palpation: Wound Preparation: Ulcer Cleansing: Ulcer Cleansing: Ulcer Cleansing: Rinsed/Irrigated with Rinsed/Irrigated with Rinsed/Irrigated with Saline, Other: surg scrub Saline, Other: surg scrub Saline, Other: surg scrub and water and water and water Topical Anesthetic Topical Anesthetic Topical Anesthetic Applied: None Applied: None Applied: None Wound Number: 6 N/A N/A Photos: No Photos N/A N/A Wound Location: Left Knee N/A N/A Wounding Event: Shear/Friction N/A N/A Primary Etiology: Skin Tear N/A N/A Comorbid History: Cataracts, Chronic sinus N/A N/A problems/congestion, Sleep Apnea, Coronary Artery Disease, Osteoarthritis, Received Chemotherapy Date Acquired: 01/25/2016 N/A N/A [name], LATHAN. ([id number]) Weeks of Treatment: 1 N/A N/A Wound  Status: Open N/A N/A Measurements L x W x D 1.3x1x0.1 N/A N/A (cm) Area (cm) : 1.021 N/A N/A Volume (cm) : 0.102 N/A N/A % Reduction in Area: 35.00% N/A N/A % Reduction in Volume: 35.00% N/A N/A Classification: Partial Thickness N/A N/A Exudate Amount: Medium N/A N/A Exudate Type: Serosanguineous N/A N/A Exudate Color: red, brown N/A N/A Wound Margin: Distinct, outline attached N/A N/A Granulation Amount: Large (67-100%) N/A N/A Granulation Quality: Red, Pink, Friable N/A N/A Necrotic Amount: None Present (0%) N/A N/A Necrotic Tissue: N/A N/A N/A Exposed Structures: Fascia: No N/A N/A Fat: No Tendon: No Muscle: No Joint: No Bone: No Limited to Skin Breakdown Epithelialization: Small (1-33%) N/A N/A Periwound Skin Texture: Edema: No N/A N/A Excoriation: No Induration: No Callus: No Crepitus: No Fluctuance: No Friable: No Rash: No Scarring: No Periwound Skin Moist: Yes N/A N/A Moisture: Maceration: No Dry/Scaly: No Periwound Skin Color: Atrophie Blanche: No N/A N/A Cyanosis: No Ecchymosis: No Erythema: No Hemosiderin Staining: No Mottled: No Pallor: No Rubor:  No Temperature: No Abnormality N/A N/A Tenderness on No N/A N/A Palpation: Wound Preparation: N/A N/A [name], [name] ([id number]) Ulcer Cleansing: Rinsed/Irrigated with Saline, Other: surg scrub and water Topical Anesthetic Applied: None Treatment Notes Wound #3 (Left Back) 1. Cleansed with: Clean wound with Normal Saline 4. Dressing Applied: Mepitel Non-Adherent gauze 5. Secondary Dressing Applied Bordered Foam Dressing Gauze and Kerlix/Conform Wound #4 (Left Elbow) 1. Cleansed with: Clean wound with Normal Saline 4. Dressing Applied: Mepitel Non-Adherent gauze 5. Secondary Dressing Applied Bordered Foam Dressing Gauze and Kerlix/Conform Wound #5 (Right Elbow) 1. Cleansed with: Clean wound with Normal Saline 4. Dressing Applied: Mepitel Non-Adherent gauze 5. Secondary Dressing  Applied Bordered Foam Dressing Gauze and Kerlix/Conform Wound #6 (Left Knee) 1. Cleansed with: Clean wound with Normal Saline 4. Dressing Applied: Mepitel Non-Adherent gauze 5. Secondary Dressing Applied Bordered Foam Dressing EXZAVIER, [name]. ([id number]) Gauze and Kerlix/Conform Electronic Signature(s) Signed: 02/06/2016 12:07:40 PM By: Rene Kocher, NP, Leah Previous Signature: 02/06/2016 9:15:58 AM Version By: Regan Lemming BSN, RN Entered By: Rene Kocher, NP, Leah on 02/06/2016 12:07:40 [place], [place] ([id number]) -------------------------------------------------------------------------------- Multi-Disciplinary Care Plan Details Patient Name: [name], [name] Date of Service: 02/06/2016 9:00 AM Medical Record Number: [id number] Patient Account Number: [id number] Date of Birth/Sex: Dec 19, 1923 (82 y.o. Male) Treating RN: Afful, RN, BSN, [employer] Primary Care Physician: [facility], Dossie Der Other Clinician: Referring Physician: Keith Rake Treating Physician/Extender: Cathie Olden in Treatment: 1 Active Inactive Abuse / Safety / Falls / Self Care Management Nursing Diagnoses: Impaired home maintenance Impaired physical mobility Knowledge deficit related to: safety; personal, health (wound), emergency Potential for falls Self care deficit: actual or potential Goals: Patient will remain injury free Date Initiated: 01/30/2016 Goal Status: Active Patient/caregiver will verbalize understanding of skin care regimen Date Initiated: 01/30/2016 Goal Status: Active Patient/caregiver will verbalize/demonstrate measures taken to prevent injury and/or falls Date Initiated: 01/30/2016 Goal Status: Active Patient/caregiver will verbalize/demonstrate understanding of what to do in case of emergency Date Initiated: 01/30/2016 Goal Status: Active Interventions: Assess fall risk on admission and as needed Assess: immobility, friction, shearing, incontinence upon admission and as needed Assess impairment of  mobility on admission and as needed per policy Assess self care needs on admission and as needed Provide education on basic hygiene Provide education on fall prevention Provide education on personal and home safety Provide education on safe transfers Treatment Activities: Education provided on Basic Hygiene : 01/30/2016 HASSEN, REEL ([id number]) Notes: Orientation to the Wound Care Program Nursing Diagnoses: Knowledge deficit related to the wound healing center program Goals: Patient/caregiver will verbalize understanding of the [place] Date Initiated: 01/30/2016 Goal Status: Active Interventions: Provide education on orientation to the wound center Notes: Wound/Skin Impairment Nursing Diagnoses: Impaired tissue integrity Knowledge deficit related to ulceration/compromised skin integrity Goals: Patient/caregiver will verbalize understanding of skin care regimen Date Initiated: 01/30/2016 Goal Status: Active Ulcer/skin breakdown will have a volume reduction of 30% by week 4 Date Initiated: 01/30/2016 Goal Status: Active Ulcer/skin breakdown will have a volume reduction of 50% by week 8 Date Initiated: 01/30/2016 Goal Status: Active Ulcer/skin breakdown will have a volume reduction of 80% by week 12 Date Initiated: 01/30/2016 Goal Status: Active Ulcer/skin breakdown will heal within 14 weeks Date Initiated: 01/30/2016 Goal Status: Active Interventions: Assess patient/caregiver ability to obtain necessary supplies Assess patient/caregiver ability to perform ulcer/skin care regimen upon admission and as needed Assess ulceration(s) every visit Provide education on ulcer and skin care [name], [name] ([id number]) Treatment Activities: Referred to DME Leiby Pigeon for dressing supplies :  01/30/2016 Skin care regimen initiated : 01/30/2016 Topical wound management initiated : 01/30/2016 Notes: Electronic Signature(s) Signed: 02/06/2016 9:15:51 AM By: Regan Lemming BSN, RN Entered By: Regan Lemming on 02/06/2016 09:15:50 [name], [name] ([id number]) -------------------------------------------------------------------------------- Pain Assessment Details Patient Name: [name], [name] Date of Service: 02/06/2016 9:00 AM Medical Record Number: [id number] Patient Account Number: [id number] Date of Birth/Sex: 28-Nov-1923 (82 y.o. Male) Treating RN: Afful, RN, BSN, Velva Harman Primary Care Physician: [facility], Dossie Der Other Clinician: Referring Physician: Keith Rake Treating Physician/Extender: Cathie Olden in Treatment: 1 Active Problems Location of Pain Severity and Description of Pain Patient Has Paino No Site Locations With Dressing Change: No Pain Management and Medication Current Pain Management: Electronic Signature(s) Signed: 02/06/2016 5:18:59 PM By: Regan Lemming BSN, RN Entered By: Regan Lemming on 02/06/2016 08:56:53 [place], [place]. ([id number]) -------------------------------------------------------------------------------- Patient/Caregiver Education Details Patient Name: Stitely, Aimee L. Date of Service: 02/06/2016 9:00 AM Medical Record Number: [id number] Patient Account Number: [id number] Date of Birth/Gender: 06/24/1923 (82 y.o. Male) Treating RN: Afful, RN, BSN, [employer] Primary Care Physician: [facility], Dossie Der Other Clinician: Referring Physician: Keith Rake Treating Physician/Extender: Cathie Olden in Treatment: 1 Education Assessment Education Provided To: Patient Education Topics Provided Basic Hygiene: Methods: Explain/Verbal Responses: State content correctly Safety: Methods: Explain/Verbal Responses: State content correctly Welcome [facility]: Methods: Explain/Verbal Responses: State content correctly Wound/Skin Impairment: Methods: Explain/Verbal Responses: State content correctly Electronic Signature(s) Signed: 02/06/2016 5:18:59 PM By: Regan Lemming BSN, RN Entered By: Regan Lemming on 02/06/2016 11:01:12 [place],  Kaleo L. ([id number]) -------------------------------------------------------------------------------- Wound Assessment Details Patient Name: [name], [name] Date of Service: 02/06/2016 9:00 AM Medical Record Number: [id number] Patient Account Number: [id number] Date of Birth/Sex: [birth year] (82 y.o. Male) Treating RN: Afful, RN, BSN, [employer] Primary Care Physician: [facility], Dossie Der Other Clinician: Referring Physician: [facility], Dossie Der Treating Physician/Extender: Cathie Olden in Treatment: 1 Wound Status Wound Number: 3 Primary Skin Tear Etiology: Wound Location: Left Back Wound Open Wounding Event: Shear/Friction Status: Date Acquired: 01/25/2016 Comorbid Cataracts, Chronic sinus Weeks Of Treatment: 1 History: problems/congestion, Sleep Apnea, Clustered Wound: No Coronary Artery Disease, Osteoarthritis, Received Chemotherapy Photos Photo Uploaded By: Regan Lemming on 02/06/2016 16:42:39 Wound Measurements Length: (cm) 9 Width: (cm) 10 Depth: (cm) 0.1 Area: (cm) 70.686 Volume: (cm) 7.069 % Reduction in Area: 35.7% % Reduction in Volume: 35.7% Epithelialization: Large (67-100%) Tunneling: No Undermining: No Wound Description Classification: Partial Thickness Foul Odor Aft Wound Margin: Distinct, outline attached Exudate Amount: Small Quain, Eliam L. ([id number]) er Cleansing: No Exudate Type: Sanguinous Exudate Color: red Wound Bed Granulation Amount: Large (67-100%) Exposed Structure Granulation Quality: Red, Friable Fascia Exposed: No Necrotic Amount: Small (1-33%) Fat Layer Exposed: No Necrotic Quality: Eschar, Adherent Slough Tendon Exposed: No Muscle Exposed: No Joint Exposed: No Bone Exposed: No Limited to Skin Breakdown Periwound Skin Texture Texture Color No Abnormalities Noted: No No Abnormalities Noted: No Callus: No Atrophie Blanche: No Crepitus: No Cyanosis: No Excoriation: No Ecchymosis: No Fluctuance: No Erythema: No Friable: No Hemosiderin  Staining: No Induration: No Mottled: No Localized Edema: No Pallor: No Rash: No Rubor: No Scarring: No Temperature / Pain Moisture Temperature: No Abnormality No Abnormalities Noted: No Dry / Scaly: Yes Maceration: No Moist: Yes Wound Preparation Ulcer Cleansing: Rinsed/Irrigated with Saline, Other: surg scrub and water, Topical Anesthetic Applied: None Treatment Notes Wound #3 (Left Back) 1. Cleansed with: Clean wound with Normal Saline 4. Dressing Applied: Mepitel Non-Adherent gauze 5. Secondary Dressing Applied Bordered Foam Dressing Gauze and Kerlix/Conform Electronic Signature(s) Hakim, Shakai L. ([id number]) Signed:  02/06/2016 5:18:59 PM By: Regan Lemming BSN, RN Entered By: Regan Lemming on 02/06/2016 09:15:43 [place], Gevin L. ([id number]) -------------------------------------------------------------------------------- Wound Assessment Details Patient Name: Rickert, Everet L. Date of Service: 02/06/2016 9:00 AM Medical Record Number: [id number] Patient Account Number: [id number] Date of Birth/Sex: March 05, 1923 (82 y.o. Male) Treating RN: Afful, RN, BSN, [employer] Primary Care Physician: [facility], Dossie Der Other Clinician: Referring Physician: [name], Dossie Der Treating Physician/Extender: Cathie Olden in Treatment: 1 Wound Status Wound Number: 4 Primary Skin Tear Etiology: Wound Location: Left Elbow Wound Open Wounding Event: Shear/Friction Status: Date Acquired: 01/25/2016 Comorbid Cataracts, Chronic sinus Weeks Of Treatment: 1 History: problems/congestion, Sleep Apnea, Clustered Wound: No Coronary Artery Disease, Osteoarthritis, Received Chemotherapy Photos Photo Uploaded By: Regan Lemming on 02/06/2016 16:43:07 Wound Measurements Length: (cm) 8 Width: (cm) 3 Depth: (cm) 0.1 Area: (cm) 18.85 Volume: (cm) 1.885 % Reduction in Area: 52.9% % Reduction in Volume: 52.9% Epithelialization: Small (1-33%) Tunneling: No Undermining: No Wound Description Classification:  Partial Thickness Foul Odor Aft Wound Margin: Distinct, outline attached Exudate Amount: Medium [place]ers, Darric L. ([id number]) er Cleansing: No Exudate Type: Sanguinous Exudate Color: red Wound Bed Granulation Amount: Large (67-100%) Exposed Structure Granulation Quality: Red, Friable Fascia Exposed: No Necrotic Amount: Small (1-33%) Fat Layer Exposed: No Necrotic Quality: Adherent Slough Tendon Exposed: No Muscle Exposed: No Joint Exposed: No Bone Exposed: No Limited to Skin Breakdown Periwound Skin Texture Texture Color No Abnormalities Noted: No No Abnormalities Noted: No Callus: No Atrophie Blanche: No Crepitus: No Cyanosis: No Excoriation: No Ecchymosis: No Fluctuance: No Erythema: No Friable: No Hemosiderin Staining: No Induration: No Mottled: No Localized Edema: No Pallor: No Rash: No Rubor: No Scarring: No Temperature / Pain Moisture Temperature: No Abnormality No Abnormalities Noted: No Dry / Scaly: No Maceration: No Moist: Yes Wound Preparation Ulcer Cleansing: Rinsed/Irrigated with Saline, Other: surg scrub and water, Topical Anesthetic Applied: None Treatment Notes Wound #4 (Left Elbow) 1. Cleansed with: Clean wound with Normal Saline 4. Dressing Applied: Mepitel Non-Adherent gauze 5. Secondary Dressing Applied Bordered Foam Dressing Gauze and Kerlix/Conform Electronic Signature(s) KYLA, TIMM ([id number]) Signed: 02/06/2016 5:18:59 PM By: Regan Lemming BSN, RN Entered By: Regan Lemming on 02/06/2016 09:13:53 Rodin, Fain L. ([id number]) -------------------------------------------------------------------------------- Wound Assessment Details Patient Name: Caldera, Iosefa L. Date of Service: 02/06/2016 9:00 AM Medical Record Number: [id number] Patient Account Number: [id number] Date of Birth/Sex: 08/06/1923 (82 y.o. Male) Treating RN: Afful, RN, BSN, [employer] Primary Care Physician: [facility], Dossie Der Other Clinician: Referring Physician: [facility], Dossie Der Treating  Physician/Extender: Cathie Olden in Treatment: 1 Wound Status Wound Number: 5 Primary Skin Tear Etiology: Wound Location: Right Elbow Wound Open Wounding Event: Shear/Friction Status: Date Acquired: 01/25/2016 Comorbid Cataracts, Chronic sinus Weeks Of Treatment: 1 History: problems/congestion, Sleep Apnea, Clustered Wound: No Coronary Artery Disease, Osteoarthritis, Received Chemotherapy Photos Photo Uploaded By: Regan Lemming on 02/06/2016 16:43:07 Wound Measurements Length: (cm) 2 Width: (cm) 2 Depth: (cm) 0.1 Area: (cm) 3.142 Volume: (cm) 0.314 % Reduction in Area: 33.3% % Reduction in Volume: 33.3% Epithelialization: Medium (34-66%) Tunneling: No Undermining: No Wound Description Classification: Partial Thickness Wound Margin: Distinct, outline attached Exudate Amount: Medium Exudate Type: Serosanguineous Exudate Color: red, brown Foul Odor After Cleansing: No Wound Bed Granulation Amount: Large (67-100%) Exposed Structure Granulation Quality: Red, Pink, Friable Fascia Exposed: No Necrotic Amount: None Present (0%) Fat Layer Exposed: No Weyers, Duc L. ([id number]) Tendon Exposed: No Muscle Exposed: No Joint Exposed: No Bone Exposed: No Limited to Skin Breakdown Periwound Skin Texture Texture Color No Abnormalities Noted: No No Abnormalities Noted: No Callus:  No Atrophie Blanche: No Crepitus: No Cyanosis: No Excoriation: No Ecchymosis: No Fluctuance: No Erythema: No Friable: No Hemosiderin Staining: No Induration: No Mottled: No Localized Edema: No Pallor: No Rash: No Rubor: No Scarring: No Temperature / Pain Moisture Temperature: No Abnormality No Abnormalities Noted: No Dry / Scaly: No Maceration: No Moist: Yes Wound Preparation Ulcer Cleansing: Rinsed/Irrigated with Saline, Other: surg scrub and water, Topical Anesthetic Applied: None Treatment Notes Wound #5 (Right Elbow) 1. Cleansed with: Clean wound with Normal  Saline 4. Dressing Applied: Mepitel Non-Adherent gauze 5. Secondary Dressing Applied Bordered Foam Dressing Gauze and Kerlix/Conform Electronic Signature(s) Signed: 02/06/2016 5:18:59 PM By: Regan Lemming BSN, RN Entered By: Regan Lemming on 02/06/2016 09:14:46 Por[place]ine, Qaadir L. ([id number]) -------------------------------------------------------------------------------- Wound Assessment Details Patient Name: Bedrosian, Kail L. Date of Service: 02/06/2016 9:00 AM Medical Record Number: [id number] Patient Account Number: [id number] Date of Birth/Sex: 1923-12-31 (82 y.o. Male) Treating RN: Afful, RN, BSN, [employer] Primary Care Physician: [facility], Dossie Der Other Clinician: Referring Physician: [facility], Dossie Der Treating Physician/Extender: Cathie Olden in Treatment: 1 Wound Status Wound Number: 6 Primary Skin Tear Etiology: Wound Location: Left Knee Wound Open Wounding Event: Shear/Friction Status: Date Acquired: 01/25/2016 Comorbid Cataracts, Chronic sinus Weeks Of Treatment: 1 History: problems/congestion, Sleep Apnea, Clustered Wound: No Coronary Artery Disease, Osteoarthritis, Received Chemotherapy Photos Photo Uploaded By: Regan Lemming on 02/06/2016 16:43:34 Wound Measurements Length: (cm) 1.3 Width: (cm) 1 Depth: (cm) 0.1 Area: (cm) 1.021 Volume: (cm) 0.102 % Reduction in Area: 35% % Reduction in Volume: 35% Epithelialization: Small (1-33%) Tunneling: No Undermining: No Wound Description Classification: Partial Thickness Foul Odor Af Wound Margin: Distinct, outline attached Exudate Amount: Medium Exudate Type: Serosanguineous Exudate Color: red, brown ter Cleansing: No Wound Bed Granulation Amount: Large (67-100%) Exposed Structure Granulation Quality: Red, Pink, Friable Fascia Exposed: No Necrotic Amount: None Present (0%) Fat Layer Exposed: No Fenstermaker, Kupono L. ([id number]) Tendon Exposed: No Muscle Exposed: No Joint Exposed: No Bone Exposed: No Limited to Skin  Breakdown Periwound Skin Texture Texture Color No Abnormalities Noted: No No Abnormalities Noted: No Callus: No Atrophie Blanche: No Crepitus: No Cyanosis: No Excoriation: No Ecchymosis: No Fluctuance: No Erythema: No Friable: No Hemosiderin Staining: No Induration: No Mottled: No Localized Edema: No Pallor: No Rash: No Rubor: No Scarring: No Temperature / Pain Moisture Temperature: No Abnormality No Abnormalities Noted: No Dry / Scaly: No Maceration: No Moist: Yes Wound Preparation Ulcer Cleansing: Rinsed/Irrigated with Saline, Other: surg scrub and water, Topical Anesthetic Applied: None Treatment Notes Wound #6 (Left Knee) 1. Cleansed with: Clean wound with Normal Saline 4. Dressing Applied: Mepitel Non-Adherent gauze 5. Secondary Dressing Applied Bordered Foam Dressing Gauze and Kerlix/Conform Electronic Signature(s) Signed: 02/06/2016 5:18:59 PM By: Regan Lemming BSN, RN Entered By: Regan Lemming on 02/06/2016 09:15:23 Williams, [place] ([id number]) -------------------------------------------------------------------------------- Vitals Details Patient Name: Imber, Sion L. Date of Service: 02/06/2016 9:00 AM Medical Record Number: [id number] Patient Account Number: [id number] Date of Birth/Sex: Aug 04, 1923 (82 y.o. Male) Treating RN: Afful, RN, BSN, Velva Harman Primary Care Physician: [facility], Dossie Der Other Clinician: Referring Physician: [facility], Dossie Der Treating Physician/Extender: Cathie Olden in Treatment: 1 Vital Signs Time Taken: 08:56 Temperature (F): 97.4 Pulse (bpm): 66 Respiratory Rate (breaths/min): 19 Blood Pressure (mmHg): 142/67 Reference Range: 80 - 120 mg / dl Electronic Signature(s) Signed: 02/06/2016 5:18:59 PM By: Regan Lemming BSN, RN Entered By: Regan Lemming on 02/06/2016 08:57:20

## 2016-02-13 ENCOUNTER — Encounter: Payer: Medicare Other | Admitting: Surgery

## 2016-02-13 DIAGNOSIS — S51011A Laceration without foreign body of right elbow, initial encounter: Secondary | ICD-10-CM | POA: Diagnosis not present

## 2016-02-13 NOTE — Progress Notes (Addendum)
Brandon, [name] ([id number]) Visit Report for 02/13/2016 Chief Complaint Document Details Patient Name: [name], [name] Date of Service: 02/13/2016 10:45 AM Medical Record Number: [id number] Patient Account Number: [id number] Date of Birth/Sex: Jul 16, 1923 (82 y.o. Male) Treating RN: Afful, RN, BSN, Velva Harman Primary Care Physician: [facility], Dossie Der Other Clinician: Referring Physician: [facility], Dossie Der Treating Physician/Extender: Frann Rider in Treatment: 2 Information Obtained from: Patient Chief Complaint Patient presents to the wound care center for a consult due non healing wound to his right elbow, left knee, left upper arm and left upper back which she's had for about a week Electronic Signature(s) Signed: 02/13/2016 11:28:18 AM By: Christin Fudge MD, FACS Entered By: Christin Fudge on 02/13/2016 11:28:18 [name], [name] ([id number]) -------------------------------------------------------------------------------- HPI Details Patient Name: [name], [name] Date of Service: 02/13/2016 10:45 AM Medical Record Number: [id number] Patient Account Number: [id number] Date of Birth/Sex: Nov 16, 1923 (82 y.o. Male) Treating RN: Afful, RN, BSN, Velva Harman Primary Care Physician: [facility], Dossie Der Other Clinician: Referring Physician: [facility], Dossie Der Treating Physician/Extender: Frann Rider in Treatment: 2 History of Present Illness Location: right elbow, left knee, left upper arm and left upper back Quality: Patient reports experiencing a dull pain to affected area(s). Severity: Patient states wound (s) are getting better. Duration: Patient has had the wound for < 1 weeks prior to presenting for treatment Timing: Pain in wound is Intermittent (comes and goes Context: The wound occurred when the patient had a fall and hurt his back Modifying Factors: Other treatment(s) tried include:local care with Neosporin ointment Associated Signs and Symptoms: Patient reports having:scar tissue opens out every time they  do dressing HPI Description: 82 year old patient who was in [place] in May a couple of times for a lacerated wound which was healed over a couple of weeks. He now comes with recent injuries to his back and knees and both arms sustained after a fall. He did not seek any medical help and they use dressing material which was previously there. Past medical history significant for carotid stenosis, [name], [name], dyslipidemia, hypertension, anxiety, GERD, multiple myeloma, BPH, inguinal hernia and is status post appendectomy, hip fracture, cardiac catheterization. He smoked in the remote past and has not smoked for several years. He has had long-term low back pain and was evaluated for pain radiating to the left groin. 02-06-16 Mr. Hsiung progress for evaluation of multiple skin tears, he is accompanied by his wife. Neither patient nor his wife expressed the concerns regarding his wounds and/or the treatment plan. They deny any falls since his last appointment or any new injuries. Electronic Signature(s) Signed: 02/13/2016 11:28:26 AM By: Christin Fudge MD, FACS Entered By: Christin Fudge on 02/13/2016 11:28:26 [name], Brandon [name] ([id number]) -------------------------------------------------------------------------------- Physical Exam Details Patient Name: [name], [name] Date of Service: 02/13/2016 10:45 AM Medical Record Number: [id number] Patient Account Number: [id number] Date of Birth/Sex: [birth year] (82 y.o. Male) Treating RN: Baruch Gouty, RN, BSN, [employer] Primary Care Physician: [facility], Dossie Der Other Clinician: Referring Physician: [facility], Dossie Der Treating Physician/Extender: Frann Rider in Treatment: 2 Constitutional . Pulse regular. Respirations normal and unlabored. Afebrile. . Eyes Nonicteric. Reactive to light. Ears, Nose, Mouth, and Throat Lips, teeth, and gums WNL.Marland Kitchen Moist mucosa without lesions. Neck supple and nontender. No palpable supraclavicular or cervical adenopathy. Normal sized  without goiter. Respiratory WNL. No retractions.. Breath sounds WNL, No rubs, rales, rhonchi, or wheeze.. Cardiovascular Heart rhythm and rate regular, no murmur or gallop.. Pedal Pulses WNL. No clubbing, cyanosis or edema. Chest Breasts symmetical and no nipple discharge.. Breast tissue WNL,  no masses, lumps, or tenderness.. Lymphatic No adneopathy. No adenopathy. No adenopathy. Musculoskeletal Adexa without tenderness or enlargement.. Digits and nails w/o clubbing, cyanosis, infection, petechiae, ischemia, or inflammatory conditions.. Integumentary (Hair, Skin) No suspicious lesions. No crepitus or fluctuance. No peri-wound warmth or erythema. No masses.Marland Kitchen Psychiatric Judgement and insight Intact.. No evidence of depression, anxiety, or agitation.. Notes the wounds have all healed very well and there is no evidence of open ulcerations except for very supple scar Electronic Signature(s) Signed: 02/13/2016 11:28:51 AM By: Evlyn Kanner MD, FACS Entered By: Evlyn Kanner on 02/13/2016 11:28:51 [name], [name] ([id number]) -------------------------------------------------------------------------------- Physician Orders Details Patient Name: [name], [name] Date of Service: 02/13/2016 10:45 AM Medical Record Number: [id number] Patient Account Number: [id number] Date of Birth/Sex: 08-Jul-1923 (82 y.o. Male) Treating RN: Afful, RN, BSN, [employer] Primary Care Physician: [facility], Kathi Ludwig Other Clinician: Referring Physician: [facility], [facility] Treating Physician/Extender: Rudene Re in Treatment: 2 Verbal / Phone Orders: Yes Clinician: Afful, RN, BSN, Rita Read Back and Verified: Yes Diagnosis Coding ICD-10 Coding Code Description S51.011A Laceration without foreign body of right elbow, initial encounter S81.012A Laceration without foreign body, left knee, initial encounter S41.112A Laceration without foreign body of left upper arm, initial encounter Laceration without foreign body of left back  wall of thorax without penetration into thoracic S21.212A cavity, initial encounter Discharge From [facility] o Discharge from Wound Care Center - Treatment completed Electronic Signature(s) Signed: 02/13/2016 4:00:14 PM By: Elpidio Eric BSN, RN Signed: 02/13/2016 4:37:15 PM By: Evlyn Kanner MD, FACS Previous Signature: 02/13/2016 11:28:59 AM Version By: Evlyn Kanner MD, FACS Entered By: Elpidio Eric on 02/13/2016 11:29:43 Scarlett, Trevelle L. ([id number]) -------------------------------------------------------------------------------- Problem List Details Patient Name: [name], [name] Date of Service: 02/13/2016 10:45 AM Medical Record Number: [id number] Patient Account Number: [id number] Date of Birth/Sex: 1924/01/28 (82 y.o. Male) Treating RN: Afful, RN, BSN, [employer] Primary Care Physician: [facility], Kathi Ludwig Other Clinician: Referring Physician: [facility], Kathi Ludwig Treating Physician/Extender: Rudene Re in Treatment: 2 Active Problems ICD-10 Encounter Code Description Active Date Diagnosis S51.011A Laceration without foreign body of right elbow, initial 01/30/2016 Yes encounter S81.012A Laceration without foreign body, left knee, initial encounter 01/30/2016 Yes S41.112A Laceration without foreign body of left upper arm, initial 01/30/2016 Yes encounter S21.212A Laceration without foreign body of left back wall of thorax 01/30/2016 Yes without penetration into thoracic cavity, initial encounter Inactive Problems Resolved Problems Electronic Signature(s) Signed: 02/13/2016 11:28:03 AM By: Evlyn Kanner MD, FACS Entered By: Evlyn Kanner on 02/13/2016 11:28:02 Lenart, Dametri L. ([id number]) -------------------------------------------------------------------------------- Progress Note Details Patient Name: [name], [name] Date of Service: 02/13/2016 10:45 AM Medical Record Number: [id number] Patient Account Number: [id number] Date of Birth/Sex: 1923-02-20 (82 y.o. Male) Treating RN: Afful, RN,  BSN, Rita Primary Care Physician: [facility], Kathi Ludwig Other Clinician: Referring Physician: [facility], Kathi Ludwig Treating Physician/Extender: Rudene Re in Treatment: 2 Subjective Chief Complaint Information obtained from Patient Patient presents to the wound care center for a consult due non healing wound to his right elbow, left knee, left upper arm and left upper back which she's had for about a week History of Present Illness (HPI) The following HPI elements were documented for the patient's wound: Location: right elbow, left knee, left upper arm and left upper back Quality: Patient reports experiencing a dull pain to affected area(s). Severity: Patient states wound (s) are getting better. Duration: Patient has had the wound for < 1 weeks prior to presenting for treatment Timing: Pain in wound is Intermittent (comes and goes Context: The wound occurred when the  patient had a fall and hurt his back Modifying Factors: Other treatment(s) tried include:local care with Neosporin ointment Associated Signs and Symptoms: Patient reports having:scar tissue opens out every time they do dressing 82 year old patient who was in [place] in May a couple of times for a lacerated wound which was healed over a couple of weeks. He now comes with recent injuries to his back and knees and both arms sustained after a fall. He did not seek any medical help and they use dressing material which was previously there. Past medical history significant for carotid stenosis, [name], [name], dyslipidemia, hypertension, anxiety, GERD, multiple myeloma, BPH, inguinal hernia and is status post appendectomy, hip fracture, cardiac catheterization. He smoked in the remote past and has not smoked for several years. He has had long-term low back pain and was evaluated for pain radiating to the left groin. 02-06-16 Mr. Hallenbeck progress for evaluation of multiple skin tears, he is accompanied by his wife. Neither patient nor his wife  expressed the concerns regarding his wounds and/or the treatment plan. They deny any falls since his last appointment or any new injuries. Objective Constitutional Pulse regular. Respirations normal and unlabored. Afebrile. [name], [name] ([id number]) Vitals Time Taken: 10:56 AM, Temperature: 97.7 F, Pulse: 73 bpm, Respiratory Rate: 19 breaths/min, Blood Pressure: 123/60 mmHg. Eyes Nonicteric. Reactive to light. Ears, Nose, Mouth, and Throat Lips, teeth, and gums WNL.Marland Kitchen Moist mucosa without lesions. Neck supple and nontender. No palpable supraclavicular or cervical adenopathy. Normal sized without goiter. Respiratory WNL. No retractions.. Breath sounds WNL, No rubs, rales, rhonchi, or wheeze.. Cardiovascular Heart rhythm and rate regular, no murmur or gallop.. Pedal Pulses WNL. No clubbing, cyanosis or edema. Chest Breasts symmetical and no nipple discharge.. Breast tissue WNL, no masses, lumps, or tenderness.. Lymphatic No adneopathy. No adenopathy. No adenopathy. Musculoskeletal Adexa without tenderness or enlargement.. Digits and nails w/o clubbing, cyanosis, infection, petechiae, ischemia, or inflammatory conditions.Marland Kitchen Psychiatric Judgement and insight Intact.. No evidence of depression, anxiety, or agitation.. General Notes: the wounds have all healed very well and there is no evidence of open ulcerations except for very supple scar Integumentary (Hair, Skin) No suspicious lesions. No crepitus or fluctuance. No peri-wound warmth or erythema. No masses.. Wound #3 status is Open. Original cause of wound was Shear/Friction. The wound is located on the Left Back. The wound measures 0cm length x 0cm width x 0cm depth; 0cm^2 area and 0cm^3 volume. The wound is limited to skin breakdown. There is no tunneling or undermining noted. There is a none present amount of drainage noted. The wound margin is distinct with the outline attached to the wound base. There is no granulation within  the wound bed. There is no necrotic tissue within the wound bed. The periwound skin appearance exhibited: Dry/Scaly. The periwound skin appearance did not exhibit: Callus, Crepitus, Excoriation, Fluctuance, Friable, Induration, Localized Edema, Rash, Scarring, Maceration, Moist, Atrophie Blanche, Cyanosis, Ecchymosis, Hemosiderin Staining, Mottled, Pallor, Rubor, Erythema. Periwound temperature was noted as No Abnormality. Wound #4 status is Open. Original cause of wound was Shear/Friction. The wound is located on the Left [name], [name] ([id number]) Elbow. The wound measures 0cm length x 0cm width x 0cm depth; 0cm^2 area and 0cm^3 volume. The wound is limited to skin breakdown. There is no tunneling or undermining noted. There is a none present amount of drainage noted. The wound margin is distinct with the outline attached to the wound base. There is no granulation within the wound bed. There is no necrotic tissue within  the wound bed. The periwound skin appearance exhibited: Dry/Scaly. The periwound skin appearance did not exhibit: Callus, Crepitus, Excoriation, Fluctuance, Friable, Induration, Localized Edema, Rash, Scarring, Maceration, Moist, Atrophie Blanche, Cyanosis, Ecchymosis, Hemosiderin Staining, Mottled, Pallor, Rubor, Erythema. Periwound temperature was noted as No Abnormality. Wound #5 status is Open. Original cause of wound was Shear/Friction. The wound is located on the Right Elbow. The wound measures 0cm length x 0cm width x 0cm depth; 0cm^2 area and 0cm^3 volume. The wound is limited to skin breakdown. There is no tunneling or undermining noted. There is a none present amount of drainage noted. The wound margin is distinct with the outline attached to the wound base. There is no granulation within the wound bed. There is no necrotic tissue within the wound bed. The periwound skin appearance did not exhibit: Callus, Crepitus, Excoriation, Fluctuance, Friable, Induration,  Localized Edema, Rash, Scarring, Dry/Scaly, Maceration, Moist, Atrophie Blanche, Cyanosis, Ecchymosis, Hemosiderin Staining, Mottled, Pallor, Rubor, Erythema. Periwound temperature was noted as No Abnormality. Wound #6 status is Open. Original cause of wound was Shear/Friction. The wound is located on the Left Knee. The wound measures 0cm length x 0cm width x 0cm depth; 0cm^2 area and 0cm^3 volume. The wound is limited to skin breakdown. There is no tunneling or undermining noted. There is a none present amount of drainage noted. The wound margin is distinct with the outline attached to the wound base. There is no granulation within the wound bed. There is no necrotic tissue within the wound bed. The periwound skin appearance exhibited: Dry/Scaly. The periwound skin appearance did not exhibit: Callus, Crepitus, Excoriation, Fluctuance, Friable, Induration, Localized Edema, Rash, Scarring, Maceration, Moist, Atrophie Blanche, Cyanosis, Ecchymosis, Hemosiderin Staining, Mottled, Pallor, Rubor, Erythema. Periwound temperature was noted as No Abnormality. Assessment Active Problems ICD-10 S51.011A - Laceration without foreign body of right elbow, initial encounter S81.012A - Laceration without foreign body, left knee, initial encounter S41.112A - Laceration without foreign body of left upper arm, initial encounter S21.212A - Laceration without foreign body of left back wall of thorax without penetration into thoracic cavity, initial encounter Plan [name], [name] ([id number]) Though the wounds have all healed I have recommended that we use bordered foam over this area to protect it and make sure the supple scar does not get ripped off. He has been discharged from the wound care services and be seen back only if needed Electronic Signature(s) Signed: 02/13/2016 11:29:43 AM By: Christin Fudge MD, FACS Entered By: Christin Fudge on 02/13/2016 11:29:43 [name], [name]  ([id number]) -------------------------------------------------------------------------------- SuperBill Details Patient Name: [name], [name] Date of Service: 02/13/2016 Medical Record Number: [id number] Patient Account Number: [id number] Date of Birth/Sex: February 23, 1923 (82 y.o. Male) Treating RN: Afful, RN, BSN, [employer] Primary Care Physician: [facility], Dossie Der Other Clinician: Referring Physician: [facility], Dossie Der Treating Physician/Extender: Frann Rider in Treatment: 2 Diagnosis Coding ICD-10 Codes Code Description X93.716R Laceration without foreign body of right elbow, initial encounter S81.012A Laceration without foreign body, left knee, initial encounter S41.112A Laceration without foreign body of left upper arm, initial encounter Laceration without foreign body of left back wall of thorax without penetration into thoracic S21.212A cavity, initial encounter Facility Procedures CPT4 Code: [id number] Description: 99213 - WOUND CARE VISIT-LEV 3 EST PT Modifier: Quantity: 1 Physician Procedures CPT4: Description Modifier Quantity Code [id number] - WC PHYS LEVEL 2 - EST PT 1 ICD-10 Description Diagnosis S51.011A Laceration without foreign body of right elbow, initial encounter S81.012A Laceration without foreign body, left knee, initial  encounter S41.112A Laceration without foreign  body of left upper arm, initial encounter S21.212A Laceration without foreign body of left back wall of thorax without penetration into thoracic cavity, initial encounter Electronic Signature(s) Signed: 02/13/2016 4:00:14 PM By: Regan Lemming BSN, RN Signed: 02/13/2016 4:37:15 PM By: Christin Fudge MD, FACS Previous Signature: 02/13/2016 11:30:01 AM Version By: Christin Fudge MD, FACS Entered By: Regan Lemming on 02/13/2016 11:31:05

## 2016-02-14 NOTE — Progress Notes (Signed)
JAYLN, HULTON ([id number]) Visit Report for 02/13/2016 Arrival Information Details Patient Name: [name], [name] Date of Service: 02/13/2016 10:45 AM Medical Record Number: [id number] Patient Account Number: [id number] Date of Birth/Sex: 02-01-1924 (82 y.o. Male) Treating RN: Afful, RN, BSN, [employer] Primary Care Physician: [facility], Dossie Der Other Clinician: Referring Physician: [facility], Dossie Der Treating Physician/Extender: Frann Rider in Treatment: 2 Visit Information History Since Last Visit All ordered tests and consults were completed: No Patient Arrived: Kasandra Knudsen Added or deleted any medications: No Arrival Time: 10:51 Any new allergies or adverse reactions: No Accompanied By: wife Had a fall or experienced change in No Transfer Assistance: None activities of daily living that may affect Patient Identification Verified: Yes risk of falls: Secondary Verification Process Completed: Yes Signs or symptoms of abuse/neglect since last No Patient Requires Transmission-Based No visito Precautions: Hospitalized since last visit: No Patient Has Alerts: No Has Dressing in Place as Prescribed: Yes Pain Present Now: No Electronic Signature(s) Signed: 02/13/2016 4:00:14 PM By: Regan Lemming BSN, RN Entered By: Regan Lemming on 02/13/2016 10:51:54 Gail, [place]. ([id number]) -------------------------------------------------------------------------------- Clinic Level of Care Assessment Details Patient Name: Oguinn, Mikias L. Date of Service: 02/13/2016 10:45 AM Medical Record Number: [id number] Patient Account Number: [id number] Date of Birth/Sex: 1923/09/16 (82 y.o. Male) Treating RN: Afful, RN, BSN, [employer] Primary Care Physician: [facility], Dossie Der Other Clinician: Referring Physician: [facility], Dossie Der Treating Physician/Extender: Frann Rider in Treatment: 2 Clinic Level of Care Assessment Items TOOL 4 Quantity Score []  - Use when only an EandM is performed on FOLLOW-UP visit 0 ASSESSMENTS - Nursing  Assessment / Reassessment X - Reassessment of Co-morbidities (includes updates in patient status) 1 10 X - Reassessment of Adherence to Treatment Plan 1 5 ASSESSMENTS - Wound and Skin Assessment / Reassessment []  - Simple Wound Assessment / Reassessment - one wound 0 X - Complex Wound Assessment / Reassessment - multiple wounds 4 5 []  - Dermatologic / Skin Assessment (not related to wound area) 0 ASSESSMENTS - Focused Assessment []  - Circumferential Edema Measurements - multi extremities 0 []  - Nutritional Assessment / Counseling / Intervention 0 []  - Lower Extremity Assessment (monofilament, tuning fork, pulses) 0 []  - Peripheral Arterial Disease Assessment (using hand held doppler) 0 ASSESSMENTS - Ostomy and/or Continence Assessment and Care []  - Incontinence Assessment and Management 0 []  - Ostomy Care Assessment and Management (repouching, etc.) 0 PROCESS - Coordination of Care X - Simple Patient / Family Education for ongoing care 1 15 []  - Complex (extensive) Patient / Family Education for ongoing care 0 []  - Staff obtains Programmer, systems, Records, Test Results / Process Orders 0 []  - Staff telephones HHA, Nursing Homes / Clarify orders / etc 0 []  - Routine Transfer to another Facility (non-emergent condition) 0 Midgley, Uvaldo L. ([id number]) []  - Routine Hospital Admission (non-emergent condition) 0 []  - New Admissions / Biomedical engineer / Ordering NPWT, Apligraf, etc. 0 []  - Emergency Hospital Admission (emergent condition) 0 []  - Simple Discharge Coordination 0 []  - Complex (extensive) Discharge Coordination 0 PROCESS - Special Needs []  - Pediatric / Minor Patient Management 0 []  - Isolation Patient Management 0 []  - Hearing / Language / Visual special needs 0 []  - Assessment of Community assistance (transportation, D/C planning, etc.) 0 []  - Additional assistance / Altered mentation 0 []  - Support Surface(s) Assessment (bed, cushion, seat, etc.) 0 INTERVENTIONS - Wound  Cleansing / Measurement []  - Simple Wound Cleansing - one wound 0 X - Complex Wound Cleansing - multiple wounds 4 5 X -  Wound Imaging (photographs - any number of wounds) 1 5 []  - Wound Tracing (instead of photographs) 0 []  - Simple Wound Measurement - one wound 0 X - Complex Wound Measurement - multiple wounds 4 5 INTERVENTIONS - Wound Dressings X - Small Wound Dressing one or multiple wounds 1 10 []  - Medium Wound Dressing one or multiple wounds 0 []  - Large Wound Dressing one or multiple wounds 0 []  - Application of Medications - topical 0 []  - Application of Medications - injection 0 INTERVENTIONS - Miscellaneous []  - External ear exam 0 Snowdon, Wentworth L. ([id number]) []  - Specimen Collection (cultures, biopsies, blood, body fluids, etc.) 0 []  - Specimen(s) / Culture(s) sent or taken to Lab for analysis 0 []  - Patient Transfer (multiple staff / Harrel Lemon Lift / Similar devices) 0 []  - Simple Staple / Suture removal (25 or less) 0 []  - Complex Staple / Suture removal (26 or more) 0 []  - Hypo / Hyperglycemic Management (close monitor of Blood Glucose) 0 []  - Ankle / Brachial Index (ABI) - do not check if billed separately 0 X - Vital Signs 1 5 Has the patient been seen at the hospital within the last three years: Yes Total Score: 110 Level Of Care: New/Established - Level 3 Notes All wounds healed after cleaning and removing old dressings. Electronic Signature(s) Signed: 02/13/2016 4:00:14 PM By: Regan Lemming BSN, RN Entered By: Regan Lemming on 02/13/2016 11:30:51 Boyce, Griffin L. ([id number]) -------------------------------------------------------------------------------- Encounter Discharge Information Details Patient Name: Debo, Tristyn L. Date of Service: 02/13/2016 10:45 AM Medical Record Number: [id number] Patient Account Number: [id number] Date of Birth/Sex: 07-17-1923 (82 y.o. Male) Treating RN: Afful, RN, BSN, Velva Harman Primary Care Physician: [facility], Dossie Der Other Clinician: Referring  Physician: [facility], Dossie Der Treating Physician/Extender: Frann Rider in Treatment: 2 Encounter Discharge Information Items Discharge Pain Level: 0 Discharge Condition: Stable Ambulatory Status: Cane Discharge Destination: Home Transportation: Private Auto Accompanied By: wife Schedule Follow-up Appointment: No Medication Reconciliation completed and provided to Patient/Care No Niklaus Mamaril: Provided on Clinical Summary of Care: 02/13/2016 Form Type Recipient Paper Patient AS Electronic Signature(s) Signed: 02/13/2016 11:33:32 AM By: Ruthine Dose Entered By: Ruthine Dose on 02/13/2016 11:33:32 Boyko, Brodrick L. ([id number]) -------------------------------------------------------------------------------- Lower Extremity Assessment Details Patient Name: Lenger, Ved L. Date of Service: 02/13/2016 10:45 AM Medical Record Number: [id number] Patient Account Number: [id number] Date of Birth/Sex: January 14, 1924 (82 y.o. Male) Treating RN: Afful, RN, BSN, [employer] Primary Care Physician: [facility], Dossie Der Other Clinician: Referring Physician: [facility], Dossie Der Treating Physician/Extender: Frann Rider in Treatment: 2 Electronic Signature(s) Signed: 02/13/2016 4:00:14 PM By: Regan Lemming BSN, RN Entered By: Regan Lemming on 02/13/2016 10:52:10 Lack, Aking L. ([id number]) -------------------------------------------------------------------------------- Multi Wound Chart Details Patient Name: Friday, Glenard L. Date of Service: 02/13/2016 10:45 AM Medical Record Number: [id number] Patient Account Number: [id number] Date of Birth/Sex: [birth year] (82 y.o. Male) Treating RN: Afful, RN, BSN, [employer] Primary Care Physician: [facility], Dossie Der Other Clinician: Referring Physician: [facility], Dossie Der Treating Physician/Extender: Frann Rider in Treatment: 2 Vital Signs Height(in): Pulse(bpm): 73 Weight(lbs): Blood Pressure 123/60 (mmHg): Body Mass Index(BMI): Temperature(F): 97.7 Respiratory  Rate 19 (breaths/min): Photos: [3:No Photos] [4:No Photos] [5:No Photos] Wound Location: [3:Left Back] [4:Left Elbow] [5:Right Elbow] Wounding Event: [3:Shear/Friction] [4:Shear/Friction] [5:Shear/Friction] Primary Etiology: [3:Skin Tear] [4:Skin Tear] [5:Skin Tear] Comorbid History: [3:Cataracts, Chronic sinus problems/congestion, Sleep Apnea, Coronary Artery Disease, Osteoarthritis, Received Chemotherapy] [4:Cataracts, Chronic sinus problems/congestion, Sleep Apnea, Coronary Artery Disease, Osteoarthritis,  Received Chemotherapy] [5:Cataracts, Chronic sinus problems/congestion, Sleep Apnea, Coronary Artery Disease, Osteoarthritis, Received Chemotherapy] Date Acquired: [3:01/25/2016] [4:01/25/2016] [  5:01/25/2016] Weeks of Treatment: [3:2] [4:2] [5:2] Wound Status: [3:Open] [4:Open] [5:Open] Measurements L x W x D 0x0x0 [4:0x0x0] [5:0x0x0] (cm) Area (cm) : [3:0] [4:0] [5:0] Volume (cm) : [3:0] [4:0] [5:0] % Reduction in Area: [3:100.00%] [4:100.00%] [5:100.00%] % Reduction in Volume: 100.00% [4:100.00%] [5:100.00%] Classification: [3:Partial Thickness] [4:Partial Thickness] [5:Partial Thickness] Exudate Amount: [3:None Present] [4:None Present] [5:None Present] Wound Margin: [3:Distinct, outline attached] [4:Distinct, outline attached] [5:Distinct, outline attached] Granulation Amount: [3:None Present (0%)] [4:None Present (0%)] [5:None Present (0%)] Necrotic Amount: [3:None Present (0%)] [4:None Present (0%)] [5:None Present (0%)] Exposed Structures: [3:Fascia: No Fat: No Tendon: No Muscle: No Joint: No Bone: No] [4:Fascia: No Fat: No Tendon: No Muscle: No Joint: No Bone: No] [5:Fascia: No Fat: No Tendon: No Muscle: No Joint: No Bone: No] Limited to Skin Limited to Skin Limited to Skin Breakdown Breakdown Breakdown Epithelialization: Large (67-100%) Large (67-100%) Large (67-100%) Periwound Skin Texture: Edema: No Edema: No Edema: No Excoriation: No Excoriation: No Excoriation:  No Induration: No Induration: No Induration: No Callus: No Callus: No Callus: No Crepitus: No Crepitus: No Crepitus: No Fluctuance: No Fluctuance: No Fluctuance: No Friable: No Friable: No Friable: No Rash: No Rash: No Rash: No Scarring: No Scarring: No Scarring: No Periwound Skin Dry/Scaly: Yes Dry/Scaly: Yes Maceration: No Moisture: Maceration: No Maceration: No Moist: No Moist: No Moist: No Dry/Scaly: No Periwound Skin Color: Atrophie Blanche: No Atrophie Blanche: No Atrophie Blanche: No Cyanosis: No Cyanosis: No Cyanosis: No Ecchymosis: No Ecchymosis: No Ecchymosis: No Erythema: No Erythema: No Erythema: No Hemosiderin Staining: No Hemosiderin Staining: No Hemosiderin Staining: No Mottled: No Mottled: No Mottled: No Pallor: No Pallor: No Pallor: No Rubor: No Rubor: No Rubor: No Temperature: No Abnormality No Abnormality No Abnormality Tenderness on No No No Palpation: Wound Preparation: Ulcer Cleansing: Ulcer Cleansing: Ulcer Cleansing: Rinsed/Irrigated with Rinsed/Irrigated with Rinsed/Irrigated with Saline Saline, Other: surg scrub Saline, Other: surg scrub and water and water Topical Anesthetic Applied: None Topical Anesthetic Topical Anesthetic Applied: None Applied: None Wound Number: 6 N/A N/A Photos: No Photos N/A N/A Wound Location: Left Knee N/A N/A Wounding Event: Shear/Friction N/A N/A Primary Etiology: Skin Tear N/A N/A Comorbid History: Cataracts, Chronic sinus N/A N/A problems/congestion, Sleep Apnea, Coronary Artery Disease, Osteoarthritis, Received Chemotherapy Date Acquired: 01/25/2016 N/A N/A Weeks of Treatment: 2 N/A N/A Wound Status: Open N/A N/A Measurements L x W x D 0x0x0 N/A N/A (cm) Lowder, Jeremi L. ([id number]) Area (cm) : 0 N/A N/A Volume (cm) : 0 N/A N/A % Reduction in Area: 100.00% N/A N/A % Reduction in Volume: 100.00% N/A N/A Classification: Partial Thickness N/A N/A Exudate Amount: None  Present N/A N/A Wound Margin: Distinct, outline attached N/A N/A Granulation Amount: None Present (0%) N/A N/A Necrotic Amount: None Present (0%) N/A N/A Exposed Structures: Fascia: No N/A N/A Fat: No Tendon: No Muscle: No Joint: No Bone: No Limited to Skin Breakdown Epithelialization: None N/A N/A Periwound Skin Texture: Edema: No N/A N/A Excoriation: No Induration: No Callus: No Crepitus: No Fluctuance: No Friable: No Rash: No Scarring: No Periwound Skin Dry/Scaly: Yes N/A N/A Moisture: Maceration: No Moist: No Periwound Skin Color: Atrophie Blanche: No N/A N/A Cyanosis: No Ecchymosis: No Erythema: No Hemosiderin Staining: No Mottled: No Pallor: No Rubor: No Temperature: No Abnormality N/A N/A Tenderness on No N/A N/A Palpation: Wound Preparation: Ulcer Cleansing: N/A N/A Rinsed/Irrigated with Saline, Other: surg scrub and water Topical Anesthetic Applied: None Treatment Notes CERONE, HISLE ([id number]) Electronic Signature(s) Signed: 02/13/2016 4:00:14 PM By: Regan Lemming BSN, RN  Previous Signature: 02/13/2016 11:28:08 AM Version By: Christin Fudge MD, FACS Entered By: Regan Lemming on 02/13/2016 11:29:15 Casamento, Keighan Carlean Jews ([id number]) -------------------------------------------------------------------------------- [place] Details Patient Name: Levier, Philipe L. Date of Service: 02/13/2016 10:45 AM Medical Record Number: [id number] Patient Account Number: [id number] Date of Birth/Sex: February 18, 1923 (82 y.o. Male) Treating RN: Afful, RN, BSN, [employer] Primary Care Physician: [facility], Dossie Der Other Clinician: Referring Physician: [facility], Dossie Der Treating Physician/Extender: Frann Rider in Treatment: 2 Active Inactive Electronic Signature(s) Signed: 02/13/2016 4:00:14 PM By: Regan Lemming BSN, RN Entered By: Regan Lemming on 02/13/2016 11:28:58 Nicastro, Shiheem L.  ([id number]) -------------------------------------------------------------------------------- Pain Assessment Details Patient Name: Gullick, Duc L. Date of Service: 02/13/2016 10:45 AM Medical Record Number: [id number] Patient Account Number: [id number] Date of Birth/Sex: [birth year] (82 y.o. Male) Treating RN: Afful, RN, BSN, Velva Harman Primary Care Physician: [facility], Dossie Der Other Clinician: Referring Physician: [facility], Dossie Der Treating Physician/Extender: Frann Rider in Treatment: 2 Active Problems Location of Pain Severity and Description of Pain Patient Has Paino No Site Locations With Dressing Change: No Pain Management and Medication Current Pain Management: Electronic Signature(s) Signed: 02/13/2016 4:00:14 PM By: Regan Lemming BSN, RN Entered By: Regan Lemming on 02/13/2016 10:52:01 Drake, [place]. ([id number]) -------------------------------------------------------------------------------- Patient/Caregiver Education Details Patient Name: Durrell, Breon L. Date of Service: 02/13/2016 10:45 AM Medical Record Number: [id number] Patient Account Number: [id number] Date of Birth/Gender: Oct 13, 1923 (82 y.o. Male) Treating RN: Afful, RN, BSN, [employer] Primary Care Physician: [facility], Dossie Der Other Clinician: Referring Physician: [facility], Dossie Der Treating Physician/Extender: Frann Rider in Treatment: 2 Education Assessment Education Provided To: Patient and Caregiver Education Topics Provided Wound/Skin Impairment: Methods: Explain/Verbal Responses: State content correctly Electronic Signature(s) Signed: 02/13/2016 4:00:14 PM By: Regan Lemming BSN, RN Entered By: Regan Lemming on 02/13/2016 11:31:36 Francois, Jcion L. ([id number]) -------------------------------------------------------------------------------- Wound Assessment Details Patient Name: Westenberger, Cliffard L. Date of Service: 02/13/2016 10:45 AM Medical Record Number: [id number] Patient Account Number: [id number] Date of Birth/Sex: 08-15-1923 (82 y.o.  Male) Treating RN: Afful, RN, BSN, [employer] Primary Care Physician: [facility], Dossie Der Other Clinician: Referring Physician: [facility], Dossie Der Treating Physician/Extender: Frann Rider in Treatment: 2 Wound Status Wound Number: 3 Primary Skin Tear Etiology: Wound Location: Left Back Wound Open Wounding Event: Shear/Friction Status: Date Acquired: 01/25/2016 Comorbid Cataracts, Chronic sinus Weeks Of Treatment: 2 History: problems/congestion, Sleep Apnea, Clustered Wound: No Coronary Artery Disease, Osteoarthritis, Received Chemotherapy Photos Photo Uploaded By: Regan Lemming on 02/13/2016 15:58:14 Wound Measurements Length: (cm) 0 % Reducti Width: (cm) 0 % Reducti Depth: (cm) 0 Epithelia Area: (cm) 0 Tunnelin Volume: (cm) 0 Undermin on in Area: 100% on in Volume: 100% lization: Large (67-100%) g: No ing: No Wound Description Classification: Partial Thickness Wound Margin: Distinct, outline attached Exudate Amount: None Present Foul Odor After Cleansing: No Wound Bed Granulation Amount: None Present (0%) Exposed Structure Necrotic Amount: None Present (0%) Fascia Exposed: No Fat Layer Exposed: No Tendon Exposed: No Muscle Exposed: No Ehrhard, Dorrell L. ([id number]) Joint Exposed: No Bone Exposed: No Limited to Skin Breakdown Periwound Skin Texture Texture Color No Abnormalities Noted: No No Abnormalities Noted: No Callus: No Atrophie Blanche: No Crepitus: No Cyanosis: No Excoriation: No Ecchymosis: No Fluctuance: No Erythema: No Friable: No Hemosiderin Staining: No Induration: No Mottled: No Localized Edema: No Pallor: No Rash: No Rubor: No Scarring: No Temperature / Pain Moisture Temperature: No Abnormality No Abnormalities Noted: No Dry / Scaly: Yes Maceration: No Moist: No Wound Preparation Ulcer Cleansing: Rinsed/Irrigated with Saline Topical Anesthetic Applied: None Electronic Signature(s) Signed: 02/13/2016 4:00:14 PM By: Regan Lemming BSN,  RN Entered By: Regan Lemming on 02/13/2016 11:11:42 Foote, [name] ([id number]) -------------------------------------------------------------------------------- Wound Assessment Details Patient Name: Luger, Jeson L. Date of Service: 02/13/2016 10:45 AM Medical Record Number: [id number] Patient Account Number: [id number] Date of Birth/Sex: July 26, 1923 (82 y.o. Male) Treating RN: Afful, RN, BSN, [employer] Primary Care Physician: [facility], Dossie Der Other Clinician: Referring Physician: [facility], Dossie Der Treating Physician/Extender: Frann Rider in Treatment: 2 Wound Status Wound Number: 4 Primary Skin Tear Etiology: Wound Location: Left Elbow Wound Open Wounding Event: Shear/Friction Status: Date Acquired: 01/25/2016 Comorbid Cataracts, Chronic sinus Weeks Of Treatment: 2 History: problems/congestion, Sleep Apnea, Clustered Wound: No Coronary Artery Disease, Osteoarthritis, Received Chemotherapy Photos Photo Uploaded By: Regan Lemming on 02/13/2016 15:58:46 Wound Measurements Length: (cm) 0 % Reductio Width: (cm) 0 % Reductio Depth: (cm) 0 Epithelial Area: (cm) 0 Tunneling Volume: (cm) 0 Undermini n in Area: 100% n in Volume: 100% ization: Large (67-100%) : No ng: No Wound Description Classification: Partial Thickness Wound Margin: Distinct, outline attached Exudate Amount: None Present Herzig, Hobart L. ([id number]) Foul Odor After Cleansing: No Wound Bed Granulation Amount: None Present (0%) Exposed Structure Necrotic Amount: None Present (0%) Fascia Exposed: No Fat Layer Exposed: No Tendon Exposed: No Muscle Exposed: No Joint Exposed: No Bone Exposed: No Limited to Skin Breakdown Periwound Skin Texture Texture Color No Abnormalities Noted: No No Abnormalities Noted: No Callus: No Atrophie Blanche: No Crepitus: No Cyanosis: No Excoriation: No Ecchymosis: No Fluctuance: No Erythema: No Friable: No Hemosiderin Staining: No Induration: No Mottled: No Localized Edema:  No Pallor: No Rash: No Rubor: No Scarring: No Temperature / Pain Moisture Temperature: No Abnormality No Abnormalities Noted: No Dry / Scaly: Yes Maceration: No Moist: No Wound Preparation Ulcer Cleansing: Rinsed/Irrigated with Saline, Other: surg scrub and water, Topical Anesthetic Applied: None Electronic Signature(s) Signed: 02/13/2016 4:00:14 PM By: Regan Lemming BSN, RN Entered By: Regan Lemming on 02/13/2016 11:11:58 Whitehead, Kincade L. ([id number]) -------------------------------------------------------------------------------- Wound Assessment Details Patient Name: Hornbrook, Windsor L. Date of Service: 02/13/2016 10:45 AM Medical Record Number: [id number] Patient Account Number: [id number] Date of Birth/Sex: 12-27-1923 (82 y.o. Male) Treating RN: Afful, RN, BSN, [employer] Primary Care Physician: [facility], Dossie Der Other Clinician: Referring Physician: [facility], Dossie Der Treating Physician/Extender: Frann Rider in Treatment: 2 Wound Status Wound Number: 5 Primary Skin Tear Etiology: Wound Location: Right Elbow Wound Open Wounding Event: Shear/Friction Status: Date Acquired: 01/25/2016 Comorbid Cataracts, Chronic sinus Weeks Of Treatment: 2 History: problems/congestion, Sleep Apnea, Clustered Wound: No Coronary Artery Disease, Osteoarthritis, Received Chemotherapy Photos Photo Uploaded By: Regan Lemming on 02/13/2016 15:59:00 Wound Measurements Length: (cm) 0 % Reducti Width: (cm) 0 % Reducti Depth: (cm) 0 Epithelia Area: (cm) 0 Tunnelin Volume: (cm) 0 Undermin on in Area: 100% on in Volume: 100% lization: Large (67-100%) g: No ing: No Wound Description Classification: Partial Thickness Wound Margin: Distinct, outline attached Exudate Amount: None Present Foul Odor After Cleansing: No Wound Bed Granulation Amount: None Present (0%) Exposed Structure Necrotic Amount: None Present (0%) Fascia Exposed: No Fat Layer Exposed: No Tendon Exposed: No Muscle Exposed: No Carranco, Adeeb  L. ([id number]) Joint Exposed: No Bone Exposed: No Limited to Skin Breakdown Periwound Skin Texture Texture Color No Abnormalities Noted: No No Abnormalities Noted: No Callus: No Atrophie Blanche: No Crepitus: No Cyanosis: No Excoriation: No Ecchymosis: No Fluctuance: No Erythema: No Friable: No Hemosiderin Staining: No Induration: No Mottled: No Localized Edema: No Pallor: No Rash: No Rubor: No Scarring: No Temperature / Pain Moisture Temperature: No Abnormality No Abnormalities Noted: No Dry / Scaly: No Maceration: No  Moist: No Wound Preparation Ulcer Cleansing: Rinsed/Irrigated with Saline, Other: surg scrub and water, Topical Anesthetic Applied: None Electronic Signature(s) Signed: 02/13/2016 4:00:14 PM By: Regan Lemming BSN, RN Entered By: Regan Lemming on 02/13/2016 11:12:13 Simms, Quince L. ([id number]) -------------------------------------------------------------------------------- Wound Assessment Details Patient Name: Szeto, Chett L. Date of Service: 02/13/2016 10:45 AM Medical Record Number: [id number] Patient Account Number: [id number] Date of Birth/Sex: [birth year] (82 y.o. Male) Treating RN: Afful, RN, BSN, [employer] Primary Care Physician: [facility], Dossie Der Other Clinician: Referring Physician: [facility], Dossie Der Treating Physician/Extender: Frann Rider in Treatment: 2 Wound Status Wound Number: 6 Primary Skin Tear Etiology: Wound Location: Left Knee Wound Open Wounding Event: Shear/Friction Status: Date Acquired: 01/25/2016 Comorbid Cataracts, Chronic sinus Weeks Of Treatment: 2 History: problems/congestion, Sleep Apnea, Clustered Wound: No Coronary Artery Disease, Osteoarthritis, Received Chemotherapy Photos Photo Uploaded By: Regan Lemming on 02/13/2016 15:59:31 Wound Measurements Length: (cm) 0 % Reducti Width: (cm) 0 % Reducti Depth: (cm) 0 Epithelia Area: (cm) 0 Tunnelin Volume: (cm) 0 Undermin on in Area: 100% on in Volume: 100% lization:  None g: No ing: No Wound Description Classification: Partial Thickness Wound Margin: Distinct, outline attached Exudate Amount: None Present Foul Odor After Cleansing: No Wound Bed Granulation Amount: None Present (0%) Exposed Structure Necrotic Amount: None Present (0%) Fascia Exposed: No Fat Layer Exposed: No Tendon Exposed: No Muscle Exposed: No Zaro, Jarman L. ([id number]) Joint Exposed: No Bone Exposed: No Limited to Skin Breakdown Periwound Skin Texture Texture Color No Abnormalities Noted: No No Abnormalities Noted: No Callus: No Atrophie Blanche: No Crepitus: No Cyanosis: No Excoriation: No Ecchymosis: No Fluctuance: No Erythema: No Friable: No Hemosiderin Staining: No Induration: No Mottled: No Localized Edema: No Pallor: No Rash: No Rubor: No Scarring: No Temperature / Pain Moisture Temperature: No Abnormality No Abnormalities Noted: No Dry / Scaly: Yes Maceration: No Moist: No Wound Preparation Ulcer Cleansing: Rinsed/Irrigated with Saline, Other: surg scrub and water, Topical Anesthetic Applied: None Electronic Signature(s) Signed: 02/13/2016 4:00:14 PM By: Regan Lemming BSN, RN Entered By: Regan Lemming on 02/13/2016 11:12:28 Heidecker, Cameron ([id number]) -------------------------------------------------------------------------------- Vitals Details Patient Name: Fleites, Gurney L. Date of Service: 02/13/2016 10:45 AM Medical Record Number: [id number] Patient Account Number: [id number] Date of Birth/Sex: 24-Feb-1923 (82 y.o. Male) Treating RN: Afful, RN, BSN, Velva Harman Primary Care Physician: [facility], Dossie Der Other Clinician: Referring Physician: [facility], Dossie Der Treating Physician/Extender: Frann Rider in Treatment: 2 Vital Signs Time Taken: 10:56 Temperature (F): 97.7 Pulse (bpm): 73 Respiratory Rate (breaths/min): 19 Blood Pressure (mmHg): 123/60 Reference Range: 80 - 120 mg / dl Electronic Signature(s) Signed: 02/13/2016 4:00:14 PM By: Regan Lemming  BSN, RN Entered By: Regan Lemming on 02/13/2016 10:56:31

## 2016-02-19 ENCOUNTER — Ambulatory Visit (INDEPENDENT_AMBULATORY_CARE_PROVIDER_SITE_OTHER): Payer: Medicare Other | Admitting: Family Medicine

## 2016-02-19 ENCOUNTER — Emergency Department: Payer: Medicare Other

## 2016-02-19 ENCOUNTER — Encounter: Payer: Self-pay | Admitting: Family Medicine

## 2016-02-19 ENCOUNTER — Emergency Department
Admission: EM | Admit: 2016-02-19 | Discharge: 2016-02-19 | Disposition: A | Discharge status: Home / Self Care | Payer: Medicare Other | Attending: Student in an Organized Health Care Education/Training Program | Admitting: Student in an Organized Health Care Education/Training Program

## 2016-02-19 VITALS — BP 110/52 | HR 70 | Temp 97.9°F | Resp 15 | Ht 70.0 in | Wt 198.8 lb

## 2016-02-19 DIAGNOSIS — F411 Generalized anxiety disorder: Secondary | ICD-10-CM

## 2016-02-19 DIAGNOSIS — S0001XA Abrasion of scalp, initial encounter: Secondary | ICD-10-CM | POA: Diagnosis not present

## 2016-02-19 DIAGNOSIS — S0990XA Unspecified injury of head, initial encounter: Secondary | ICD-10-CM | POA: Diagnosis present

## 2016-02-19 DIAGNOSIS — R0789 Other chest pain: Secondary | ICD-10-CM

## 2016-02-19 DIAGNOSIS — Y999 Unspecified external cause status: Secondary | ICD-10-CM | POA: Insufficient documentation

## 2016-02-19 DIAGNOSIS — E785 Hyperlipidemia, unspecified: Secondary | ICD-10-CM | POA: Diagnosis not present

## 2016-02-19 DIAGNOSIS — Z87891 Personal history of nicotine dependence: Secondary | ICD-10-CM | POA: Insufficient documentation

## 2016-02-19 DIAGNOSIS — Y9389 Activity, other specified: Secondary | ICD-10-CM | POA: Insufficient documentation

## 2016-02-19 DIAGNOSIS — J90 Pleural effusion, not elsewhere classified: Secondary | ICD-10-CM | POA: Diagnosis not present

## 2016-02-19 DIAGNOSIS — Y92524 Gas station as the place of occurrence of the external cause: Secondary | ICD-10-CM | POA: Diagnosis not present

## 2016-02-19 DIAGNOSIS — Z0000 Encounter for general adult medical examination without abnormal findings: Secondary | ICD-10-CM

## 2016-02-19 DIAGNOSIS — W01198A Fall on same level from slipping, tripping and stumbling with subsequent striking against other object, initial encounter: Secondary | ICD-10-CM | POA: Diagnosis not present

## 2016-02-19 DIAGNOSIS — I2510 Atherosclerotic heart disease of native coronary artery without angina pectoris: Secondary | ICD-10-CM | POA: Diagnosis not present

## 2016-02-19 DIAGNOSIS — I10 Essential (primary) hypertension: Secondary | ICD-10-CM | POA: Insufficient documentation

## 2016-02-19 DIAGNOSIS — W19XXXA Unspecified fall, initial encounter: Secondary | ICD-10-CM

## 2016-02-19 LAB — BASIC METABOLIC PANEL
Anion gap: 8 (ref 5–15)
BUN: 24 mg/dL — ABNORMAL HIGH (ref 6–20)
CO2: 22 mmol/L (ref 22–32)
Calcium: 9.3 mg/dL (ref 8.9–10.3)
Chloride: 106 mmol/L (ref 101–111)
Creatinine, Ser: 1.31 mg/dL — ABNORMAL HIGH (ref 0.61–1.24)
GFR calc Af Amer: 54 mL/min — ABNORMAL LOW (ref >60)
GFR calc non Af Amer: 46 mL/min — ABNORMAL LOW (ref >60)
Glucose, Bld: 90 mg/dL (ref 65–99)
Potassium: 4.8 mmol/L (ref 3.5–5.1)
Sodium: 136 mmol/L (ref 135–145)

## 2016-02-19 LAB — CBC
HCT: 36.2 % — ABNORMAL LOW (ref 40.0–52.0)
Hemoglobin: 12.1 g/dL — ABNORMAL LOW (ref 13.0–18.0)
MCH: 29.7 pg (ref 26.0–34.0)
MCHC: 33.5 g/dL (ref 32.0–36.0)
MCV: 88.5 fL (ref 80.0–100.0)
Platelets: 207 10*3/uL (ref 150–440)
RBC: 4.09 MIL/uL — ABNORMAL LOW (ref 4.40–5.90)
RDW: 15 % — ABNORMAL HIGH (ref 11.5–14.5)
WBC: 13.7 10*3/uL — ABNORMAL HIGH (ref 3.8–10.6)

## 2016-02-19 LAB — COMPLETE METABOLIC PANEL WITH GFR
ALT: 9 U/L (ref 9–46)
AST: 17 U/L (ref 10–35)
Albumin: 3.7 g/dL (ref 3.6–5.1)
Alkaline Phosphatase: 93 U/L (ref 40–115)
BUN: 24 mg/dL (ref 7–25)
CO2: 21 mmol/L (ref 20–31)
Calcium: 9.1 mg/dL (ref 8.6–10.3)
Chloride: 106 mmol/L (ref 98–110)
Creat: 1.41 mg/dL — ABNORMAL HIGH (ref 0.70–1.11)
GFR, Est African American: 50 mL/min — ABNORMAL LOW (ref >=60)
GFR, Est Non African American: 44 mL/min — ABNORMAL LOW (ref >=60)
Glucose, Bld: 104 mg/dL — ABNORMAL HIGH (ref 65–99)
Potassium: 5 mmol/L (ref 3.5–5.3)
Sodium: 138 mmol/L (ref 135–146)
Total Bilirubin: 0.5 mg/dL (ref 0.2–1.2)
Total Protein: 7.7 g/dL (ref 6.1–8.1)

## 2016-02-19 LAB — LIPID PANEL
Cholesterol: 68 mg/dL (ref <200)
HDL: 20 mg/dL — ABNORMAL LOW (ref >40)
LDL Cholesterol: 23 mg/dL (ref <100)
Total CHOL/HDL Ratio: 3.4 Ratio (ref <5.0)
Triglycerides: 123 mg/dL (ref <150)
VLDL: 25 mg/dL (ref <30)

## 2016-02-19 MED ORDER — BACITRACIN ZINC 500 UNIT/GM EX OINT
TOPICAL_OINTMENT | Freq: Once | CUTANEOUS | Status: AC
Start: 2016-02-19 — End: 2016-02-19
  Administered 2016-02-19: 1 via TOPICAL

## 2016-02-19 MED ORDER — ACETAMINOPHEN 325 MG PO TABS
650.0000 mg | ORAL_TABLET | Freq: Once | ORAL | Status: AC
  Administered 2016-02-19: 650 mg via ORAL
  Filled 2016-02-19: qty 2

## 2016-02-19 MED ORDER — DICLOFENAC SODIUM 1 % TD GEL: 2.0000 g | Freq: Four times a day (QID) | TRANSDERMAL | 0 refills | Status: DC

## 2016-02-19 MED ORDER — IOPAMIDOL (ISOVUE-300) INJECTION 61%
60.0000 mL | Freq: Once | INTRAVENOUS | Status: AC | PRN
  Administered 2016-02-19: 60 mL via INTRAVENOUS
  Filled 2016-02-19: qty 60

## 2016-02-19 MED ORDER — BACITRACIN ZINC 500 UNIT/GM EX OINT
TOPICAL_OINTMENT | CUTANEOUS | Status: AC
  Administered 2016-02-19: 1 via TOPICAL
  Filled 2016-02-19: qty 0.9

## 2016-02-19 MED ORDER — METOPROLOL TARTRATE 25 MG PO TABS: 25.0000 mg | ORAL_TABLET | Freq: Two times a day (BID) | ORAL | 0 refills | Status: DC

## 2016-02-19 MED ORDER — CLONAZEPAM 0.5 MG PO TABS: 0.5000 mg | ORAL_TABLET | Freq: Three times a day (TID) | ORAL | 0 refills | Status: DC | PRN

## 2016-02-19 NOTE — Progress Notes (Signed)
Name: [name]   MRN: [id number]    DOB: [birth year]   Date:02/19/2016       Progress Note  Subjective  Chief Complaint  Chief Complaint  Patient presents with  . Medicare Wellness    HPI  Pt. Presents for [employer] visit.   Subjective:   [name] is a 83 y.o. male who presents for a Welcome to Medicare exam.   Review of Systems:        Objective:    Today's Vitals   02/19/16 0905 02/19/16 0906  BP: (!) 110/52   Pulse: 70   Resp: 15   Temp: 97.9 F (36.6 C)   TempSrc: Oral   SpO2: 94%   Weight: 198 lb 12.8 oz (90.2 kg)   Height: '5\' 10"'$  (1.778 m)   PainSc:  4    Body mass index is 28.52 kg/m.  Medications Outpatient Encounter Prescriptions as of 02/19/2016  Medication Sig  . aspirin EC 81 MG tablet Take 1 tablet (81 mg total) by mouth daily.  . Cholecalciferol (D 5000) 5000 UNITS TABS Take by mouth.  . clindamycin (CLEOCIN) 300 MG capsule Take 1 capsule (300 mg total) by mouth 4 (four) times daily.  . clonazePAM (KLONOPIN) 0.5 MG tablet Take 1 tablet (0.5 mg total) by mouth 3 (three) times daily as needed for anxiety.  Marland Kitchen doxazosin (CARDURA) 1 MG tablet TAKE ONE TABLET BY MOUTH ONCE DAILY  . isosorbide mononitrate (IMDUR) 30 MG 24 hr tablet Take 1 tablet (30 mg total) by mouth daily.  Marland Kitchen lisinopril (PRINIVIL,ZESTRIL) 2.5 MG tablet Take 1 tablet (2.5 mg total) by mouth daily.  . metoprolol tartrate (LOPRESSOR) 25 MG tablet TAKE ONE TABLET BY MOUTH TWICE DAILY  . Multiple Vitamins-Minerals (PRESERVISION/LUTEIN PO) Take by mouth 2 (two) times daily.  . nitroGLYCERIN (NITROSTAT) 0.4 MG SL tablet Place 0.4 mg under the tongue every 5 (five) minutes as needed for chest pain.  . pantoprazole (PROTONIX) 40 MG tablet Take 1 tablet (40 mg total) by mouth daily.  . polyethylene glycol (MIRALAX / GLYCOLAX) packet Take 17 g by mouth daily.  . pravastatin (PRAVACHOL) 40 MG tablet Take 1 tablet (40 mg total) by mouth daily.  . sertraline (ZOLOFT) 100 MG tablet Take  1.5 tablets (150 mg total) by mouth at bedtime.  Marland Kitchen HYDROcodone-acetaminophen (NORCO/VICODIN) 5-325 MG tablet Take 1 tablet by mouth 3 (three) times daily as needed.    No facility-administered encounter medications on file as of 02/19/2016.      History: Past Medical History:  Diagnosis Date  . Anxiety   . BPH (benign prostatic hypertrophy)   . Carotid stenosis   . Chronic back pain   . Coronary artery disease   . Dyslipidemia   . GERD (gastroesophageal reflux disease)   . Hx of [name]   . Hyperlipidemia   . Hypertension   . Inguinal hernia   . 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 Major depression   . Multiple myeloma ([place])   . Pleural effusion    left  . Sleep apnea    Past Surgical History:  Procedure Laterality Date  . APPENDECTOMY    . CARDIAC CATHETERIZATION  2002  . CARDIAC CATHETERIZATION  06/2013  . COLONOSCOPY    . HIP FRACTURE SURGERY      Family History  Problem Relation Age of Onset  . Heart disease Mother   . Stroke Mother   . Diabetes Father   . Dementia Father   .  Stroke Sister   . Stroke Brother   . Stroke Sister   . Stroke Sister   . Stroke Brother   . Stroke Brother    Social History   Occupational History  . Not on file.   Social History Main Topics  . Smoking status: Former Smoker    Packs/day: 2.00    Years: 7.00    Types: Cigarettes    Quit date: 09/17/1953  . Smokeless tobacco: Never Used  . Alcohol use No  . Drug use: No  . Sexual activity: Not Currently   Tobacco Counseling Counseling given: Not Answered   Immunizations and Health Maintenance Immunization History  Administered Date(s) Administered  . Influenza, High Dose Seasonal PF 10/24/2014  . Influenza-Unspecified 11/26/2015  . Tdap 11/28/2014   Health Maintenance Due  Topic Date Due  . ZOSTAVAX  10/23/1985  . PNA vac Low Risk Adult (1 of 2 - PCV13) 10/24/1990    Activities of Daily Living In your present state of health, do you have any difficulty performing  the following activities: 02/19/2016 10/14/2015  Hearing? [name] [place]  Vision? Y Y  Difficulty concentrating or making decisions? N N  Walking or climbing stairs? Y Y  Dressing or bathing? N N  Doing errands, shopping? Y Y  Some recent data might be hidden  Hearing Aid in both ears. Vision is fine. Does not climb stairs much but could do while holding on to rails. Has to use a walker or a cane for ambulation.  Does not do much shopping,  Physical Exam    Advanced Directives: Does Patient Have a Medical Advance Directive?: Yes Type of Advance Directive: Healthcare Power of Attorney, Living will [place] in Chart?: No - copy requested    Assessment:    This is a routine wellness  examination for this patient .  Vision/Hearing screen No exam data present  Dietary issues and exercise activities discussed:   Diet is appropriate to age, eats a balanced meal three times a day. He does a lot of exercise, has used arms and feet machine for exercise.   Goals    None      Depression Screen PHQ 2/9 Scores 02/19/2016 10/14/2015 07/01/2015 05/30/2015  PHQ - 2 Score 0 0 0 0  Has depression and is on medications.    Fall Risk Fall Risk  02/19/2016  Falls in the past year? No  Number falls in past yr: -  Injury with Fall? -  Risk Factor Category  -  Risk for fall due to : -  Follow up -  Falls in the past year: 5-6, mostly at home. Had a fall 4 weeks ago, [facility] and is scheduled to see them for follow up tomorrow.   Cognitive Function  Alert and Oriented to self, place and time.     Patient Care Team: Roselee Nova, MD as PCP - General (Family Medicine) Wellington Hampshire, MD as Consulting Physician (Cardiology) Neurosurgeon Dr. Sharlet Salina    Plan:     During the course of the visit,[name] was educated and counseled about the following appropriate screening and preventive services:   Vaccines to include Pneumoccal, Influenza, Hepatitis B, Td,  Zostavax, HCV  His current medications and allergies were reviewed and needed refills of his chronic medications were ordered. The plan for yearly health maintenance was discussed all orders and referrals were made as appropriate.  Patient Instructions (the written plan) was given to  the patient.   Keith Rake, MD 02/19/2016      Past Medical History:  Diagnosis Date  . Anxiety   . BPH (benign prostatic hypertrophy)   . Carotid stenosis   . Chronic back pain   . Coronary artery disease   . Dyslipidemia   . GERD (gastroesophageal reflux disease)   . Hx of [name]   . Hyperlipidemia   . Hypertension   . Inguinal hernia   . 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 Major depression   . Multiple myeloma ([place])   . Pleural effusion    left  . Sleep apnea     Past Surgical History:  Procedure Laterality Date  . APPENDECTOMY    . CARDIAC CATHETERIZATION  2002  . CARDIAC CATHETERIZATION  06/2013  . COLONOSCOPY    . HIP FRACTURE SURGERY      Family History  Problem Relation Age of Onset  . Heart disease Mother   . Stroke Mother   . Diabetes Father   . Dementia Father   . Stroke Sister   . Stroke Brother   . Stroke Sister   . Stroke Sister   . Stroke Brother   . Stroke Brother     Social History   Social History  . Marital status: Married    Spouse name: N/A  . Number of children: N/A  . Years of education: N/A   Occupational History  . Not on file.   Social History Main Topics  . Smoking status: Former Smoker    Packs/day: 2.00    Years: 7.00    Types: Cigarettes    Quit date: 09/17/1953  . Smokeless tobacco: Never Used  . Alcohol use No  . Drug use: No  . Sexual activity: Not Currently   Other Topics Concern  . Not on file   Social History Narrative  . No narrative on file     Current Outpatient Prescriptions:  .  aspirin EC 81 MG tablet, Take 1 tablet (81 mg total) by mouth daily., Disp: 90 tablet, Rfl: 3 .  Cholecalciferol (D 5000) 5000 UNITS  TABS, Take by mouth., Disp: , Rfl:  .  clindamycin (CLEOCIN) 300 MG capsule, Take 1 capsule (300 mg total) by mouth 4 (four) times daily., Disp: 28 capsule, Rfl: 0 .  clonazePAM (KLONOPIN) 0.5 MG tablet, Take 1 tablet (0.5 mg total) by mouth 3 (three) times daily as needed for anxiety., Disp: 90 tablet, Rfl: 0 .  doxazosin (CARDURA) 1 MG tablet, TAKE ONE TABLET BY MOUTH ONCE DAILY, Disp: 90 tablet, Rfl: 0 .  isosorbide mononitrate (IMDUR) 30 MG 24 hr tablet, Take 1 tablet (30 mg total) by mouth daily., Disp: 90 tablet, Rfl: 1 .  lisinopril (PRINIVIL,ZESTRIL) 2.5 MG tablet, Take 1 tablet (2.5 mg total) by mouth daily., Disp: 90 tablet, Rfl: 0 .  metoprolol tartrate (LOPRESSOR) 25 MG tablet, TAKE ONE TABLET BY MOUTH TWICE DAILY, Disp: 30 tablet, Rfl: 0 .  Multiple Vitamins-Minerals (PRESERVISION/LUTEIN PO), Take by mouth 2 (two) times daily., Disp: , Rfl:  .  nitroGLYCERIN (NITROSTAT) 0.4 MG SL tablet, Place 0.4 mg under the tongue every 5 (five) minutes as needed for chest pain., Disp: , Rfl:  .  pantoprazole (PROTONIX) 40 MG tablet, Take 1 tablet (40 mg total) by mouth daily., Disp: 90 tablet, Rfl: 0 .  polyethylene glycol (MIRALAX / GLYCOLAX) packet, Take 17 g by mouth daily., Disp: , Rfl:  .  pravastatin (PRAVACHOL) 40 MG tablet, Take 1 tablet (  40 mg total) by mouth daily., Disp: 90 tablet, Rfl: 0 .  sertraline (ZOLOFT) 100 MG tablet, Take 1.5 tablets (150 mg total) by mouth at bedtime., Disp: 45 tablet, Rfl: 2 .  HYDROcodone-acetaminophen (NORCO/VICODIN) 5-325 MG tablet, Take 1 tablet by mouth 3 (three) times daily as needed. , Disp: , Rfl:   Allergies  Allergen Reactions  . Sulfamethoxazole-Trimethoprim Nausea Only and Other (See Comments)  . Baclofen      Review of Systems  Constitutional: Positive for malaise/fatigue. Negative for chills and fever.  HENT: Negative for congestion and sore throat.   Eyes: Negative for blurred vision and double vision.  Respiratory: Negative for cough,  sputum production and shortness of breath.   Cardiovascular: Negative for chest pain, palpitations and leg swelling.  Gastrointestinal: Negative for abdominal pain, blood in stool, constipation, diarrhea, nausea and vomiting.  Genitourinary: Negative for dysuria and hematuria.  Musculoskeletal: Positive for back pain and joint pain.  Neurological: Negative for dizziness and headaches.  Psychiatric/Behavioral: Positive for depression. The patient is nervous/anxious. The patient does not have insomnia.      Objective  Vitals:   02/19/16 0905  BP: (!) 110/52  Pulse: 70  Resp: 15  Temp: 97.9 F (36.6 C)  TempSrc: Oral  SpO2: 94%  Weight: 198 lb 12.8 oz (90.2 kg)  Height: '5\' 10"'$  (1.778 m)    Physical Exam  Constitutional: He is oriented to person, place, and time and well-developed, well-nourished, and in no distress.  HENT:  Head: Normocephalic and atraumatic.  Right Ear: Tympanic membrane and ear canal normal. No drainage or swelling.  Left Ear: Tympanic membrane and ear canal normal. No drainage or swelling.  Mouth/Throat: No posterior oropharyngeal erythema.  Cardiovascular: Normal rate, regular rhythm, S1 normal, S2 normal and normal heart sounds.   No murmur heard. Pulmonary/Chest: Effort normal and breath sounds normal. He has no decreased breath sounds.  Abdominal: Soft. Bowel sounds are normal. There is no tenderness.  Musculoskeletal:       Right ankle: He exhibits no swelling.       Left ankle: He exhibits no swelling.       Lumbar back: He exhibits tenderness and pain. He exhibits no spasm.       Back:  Neurological: He is alert and oriented to person, place, and time.  Skin: Skin is warm and dry.  Psychiatric: Mood, memory, affect and judgment normal.  Nursing note and vitals reviewed.   Assessment & Plan  1. Medicare annual wellness visit, subsequent   2. Generalized anxiety disorder  - clonazePAM (KLONOPIN) 0.5 MG tablet; Take 1 tablet (0.5 mg total)  by mouth 3 (three) times daily as needed for anxiety.  Dispense: 90 tablet; Refill: 0  3. Coronary artery disease involving native coronary artery of native heart without angina pectoris  - metoprolol tartrate (LOPRESSOR) 25 MG tablet; Take 1 tablet (25 mg total) by mouth 2 (two) times daily.  Dispense: 180 tablet; Refill: 0  4. Hyperlipidemia, unspecified hyperlipidemia type  - Lipid Profile - COMPLETE METABOLIC PANEL WITH GFR   Shauni Henner [facility] 02/19/2016 9:50 AM

## 2016-02-19 NOTE — ED Triage Notes (Addendum)
Pt reports mechanical trip and fall on pavement. Hematoma and small laceration to posterior left side of head, no bleeding at this time. Pt reports left sided shoulder pain since fall. Pt able to move arm with full ROM. Pt reports dizziness since fall. Pt alert and oriented X4, active, cooperative, pt in NAD. RR even and unlabored, color WNL.    Pt not taking any blood thinners.

## 2016-02-19 NOTE — ED Provider Notes (Signed)
[facility] Emergency Department Provider Note    First MD Initiated Contact with Patient 02/19/16 1525     (approximate)  I have reviewed the triage vital signs and the nursing notes.   HISTORY  Chief Complaint Fall and Head Injury    HPI Haedyn L Rosero is a 83 y.o. male presents after mechanical fall from standing with head injury and left-sided chest pain. 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 He uses a walker to ambulate, was not using one at the gas station.  We'll try to get back another car the patient lost his footing and fell on his left side. Did hit his head. Unable to recall whether he had loss of consciousness. He was unable to get up after the fall. Arrives to the ER with contusion and abrasion to the left posterior scalp as well as left chest wall pain. He denies any difficulty breathing. Denies any abdominal pain.  Past Medical History:  Diagnosis Date  . Anxiety   . BPH (benign prostatic hypertrophy)   . Carotid stenosis   . Chronic back pain   . Coronary artery disease   . Dyslipidemia   . GERD (gastroesophageal reflux disease)   . Hx of [name]   . Hyperlipidemia   . Hypertension   . Inguinal hernia   . 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 Major depression   . Multiple myeloma ([place])   . Pleural effusion    left  . Sleep apnea    Family History  Problem Relation Age of Onset  . Heart disease Mother   . Stroke Mother   . Diabetes Father   . Dementia Father   . Stroke Sister   . Stroke Brother   . Stroke Sister   . Stroke Sister   . Stroke Brother   . Stroke Brother    Past Surgical History:  Procedure Laterality Date  . APPENDECTOMY    . CARDIAC CATHETERIZATION  2002  . CARDIAC CATHETERIZATION  06/2013  . COLONOSCOPY    . HIP FRACTURE SURGERY     Patient Active Problem List   Diagnosis Date Noted  . Medicare annual wellness visit, subsequent 02/19/2016  . Major depression 10/14/2015  . Behavior  disturbance 09/12/2015  . Mobility impaired 08/27/2015  . Skin ulcer of back ([place]) 07/01/2015  . Laceration of right hand 07/01/2015  . Traumatic hematoma of lower back 07/01/2015  . Encounters for administrative purpose 04/01/2015  . Skin lesion of face 12/24/2014  . Contusion of right hand 11/28/2014  . Herpes zoster 10/30/2014  . Need for immunization against influenza 10/24/2014  . Compression fracture of L4 lumbar vertebra ([facility]) 09/28/2014  . Rectal or anal pain 08/13/2014  . Anxiety disorder 07/23/2014  . BPH without obstruction/lower urinary tract symptoms 07/23/2014  . Atherosclerosis of coronary artery 07/23/2014  . Chronic LBP 07/23/2014  . Hypercholesteremia 07/23/2014  . Benign hypertension 07/23/2014  . Acid reflux 07/23/2014  . Chronic recurrent major depressive disorder ([place]) 07/23/2014  . GERD (gastroesophageal reflux disease) 07/23/2014  . Coronary artery disease involving native coronary artery without angina pectoris 07/06/2014  . Essential hypertension 07/06/2014  . Carotid stenosis 07/06/2014  . Hyperlipidemia   . H/O [name] 12/20/2011      Prior to Admission medications   Medication Sig Start Date End Date Taking? Authorizing Provider  aspirin EC 81 MG tablet Take 1 tablet (81 mg total) by mouth daily. 07/18/14   Rogue Jury A  Fletcher Anon, MD  Cholecalciferol (D 5000) 5000 UNITS TABS Take by mouth.    Historical Provider, MD  clindamycin (CLEOCIN) 300 MG capsule Take 1 capsule (300 mg total) by mouth 4 (four) times daily. 11/28/14   Roselee Nova, MD  clonazePAM (KLONOPIN) 0.5 MG tablet Take 1 tablet (0.5 mg total) by mouth 3 (three) times daily as needed for anxiety. 02/19/16   Roselee Nova, MD  doxazosin (CARDURA) 1 MG tablet TAKE ONE TABLET BY MOUTH ONCE DAILY 01/24/16   Roselee Nova, MD  HYDROcodone-acetaminophen (NORCO/VICODIN) 5-325 MG tablet Take 1 tablet by mouth 3 (three) times daily as needed.  05/28/15   Historical Provider, MD  isosorbide  mononitrate (IMDUR) 30 MG 24 hr tablet Take 1 tablet (30 mg total) by mouth daily. 11/06/15   Roselee Nova, MD  lisinopril (PRINIVIL,ZESTRIL) 2.5 MG tablet Take 1 tablet (2.5 mg total) by mouth daily. 12/20/15   Roselee Nova, MD  metoprolol tartrate (LOPRESSOR) 25 MG tablet Take 1 tablet (25 mg total) by mouth 2 (two) times daily. 02/19/16   Roselee Nova, MD  Multiple Vitamins-Minerals (PRESERVISION/LUTEIN PO) Take by mouth 2 (two) times daily.    Historical Provider, MD  nitroGLYCERIN (NITROSTAT) 0.4 MG SL tablet Place 0.4 mg under the tongue every 5 (five) minutes as needed for chest pain.    Historical Provider, MD  pantoprazole (PROTONIX) 40 MG tablet Take 1 tablet (40 mg total) by mouth daily. 02/05/16   Roselee Nova, MD  polyethylene glycol (MIRALAX / Floria Raveling) packet Take 17 g by mouth daily.    Historical Provider, MD  pravastatin (PRAVACHOL) 40 MG tablet Take 1 tablet (40 mg total) by mouth daily. 01/02/16   Roselee Nova, MD  sertraline (ZOLOFT) 100 MG tablet Take 1.5 tablets (150 mg total) by mouth at bedtime. 01/08/16   Roselee Nova, MD    Allergies Sulfamethoxazole-trimethoprim and Baclofen    Social History Social History  Substance Use Topics  . Smoking status: Former Smoker    Packs/day: 2.00    Years: 7.00    Types: Cigarettes    Quit date: 09/17/1953  . Smokeless tobacco: Never Used  . Alcohol use No    Review of Systems Patient denies headaches, rhinorrhea, blurry vision, numbness, shortness of breath, chest pain, edema, cough, abdominal pain, nausea, vomiting, diarrhea, dysuria, fevers, rashes or hallucinations unless otherwise stated above in HPI. ____________________________________________   PHYSICAL EXAM:  VITAL SIGNS: Vitals:   02/19/16 1234 02/19/16 1235  BP:  (!) 125/93  Pulse: 64   Resp: 16   Temp: 98 F (36.7 C)     Constitutional: Alert and oriented. Well appearing and in no acute distress. Eyes: Conjunctivae are normal.  PERRL. EOMI. Head: superficial abrasion to left parietal scalp Nose: No congestion/rhinnorhea. Mouth/Throat: Mucous membranes are moist.  Oropharynx non-erythematous. Neck: No stridor. Painless ROM. No cervical spine tenderness to palpation Hematological/Lymphatic/Immunilogical: No cervical lymphadenopathy. Cardiovascular: Normal rate, regular rhythm. Grossly normal heart sounds.  Good peripheral circulation. Respiratory: Normal respiratory effort.  No retractions. Lungs coarse bibasilar breathsounds Gastrointestinal: Soft and nontender. No distention. No abdominal bruits. No CVA tenderness. Musculoskeletal: No lower extremity tenderness nor edema.  No joint effusions.  Full painless ROM of BUE.  ttp of left chest wall, no ecchymosis Neurologic:  Normal speech and language. No gross focal neurologic deficits are appreciated. No gait instability. Skin:  Skin is warm, dry and intact. No rash noted.  Psychiatric: Mood and affect are normal. Speech and behavior are normal.  ____________________________________________   LABS (all labs ordered are listed, but only abnormal results are displayed)  Results for orders placed or performed during the hospital encounter of 02/19/16 (from the past 24 hour(s))  Basic metabolic panel     Status: Abnormal   Collection Time: 02/19/16  4:37 PM  Result Value Ref Range   Sodium 136 135 - 145 mmol/L   Potassium 4.8 3.5 - 5.1 mmol/L   Chloride 106 101 - 111 mmol/L   CO2 22 22 - 32 mmol/L   Glucose, Bld 90 65 - 99 mg/dL   BUN 24 (H) 6 - 20 mg/dL   Creatinine, Ser 1.31 (H) 0.61 - 1.24 mg/dL   Calcium 9.3 8.9 - 10.3 mg/dL   GFR calc non Af Amer 46 (L) >60 mL/min   GFR calc Af Amer 54 (L) >60 mL/min   Anion gap 8 5 - 15  CBC     Status: Abnormal   Collection Time: 02/19/16  4:37 PM  Result Value Ref Range   WBC 13.7 (H) 3.8 - 10.6 K/uL   RBC 4.09 (L) 4.40 - 5.90 MIL/uL   Hemoglobin 12.1 (L) 13.0 - 18.0 g/dL   HCT 36.2 (L) 40.0 - 52.0 %   MCV 88.5 80.0  - 100.0 fL   MCH 29.7 26.0 - 34.0 pg   MCHC 33.5 32.0 - 36.0 g/dL   RDW 15.0 (H) 11.5 - 14.5 %   Platelets 207 150 - 440 K/uL   ____________________________________________  EKG ED ECG REPORT I, Merlyn Lot, the attending physician, personally viewed and interpreted this ECG.   Date: 02/19/2016  EKG Time: 12:37  Rate: 60  Rhythm: normal EKG, normal sinus rhythm, unchanged from previous tracings  Axis: normal  Intervals:normal intervals  ST&T Change: no STEMI  __________ __________________________________  RADIOLOGY  I personally reviewed all radiographic images ordered to evaluate for the above acute complaints and reviewed radiology reports and findings.  These findings were personally discussed with the patient.  Please see medical record for radiology report.  ____________________________________________   PROCEDURES  Procedure(s) performed:  Procedures    Critical Care performed: no ____________________________________________   INITIAL IMPRESSION / ASSESSMENT AND PLAN / ED COURSE  Pertinent labs & imaging results that were available during my care of the patient were reviewed by me and considered in my medical decision making (see chart for details).  DDX: sah, sdh, edh, fracture, contusion, soft tissue injury, viscous injury, concussion, hemorrhage   Quoc L Samuelson is a 83 y.o. who presents to the ED with mechanical fall from standing with head injury and left-sided chest pain. CT imaging of the head ordered to evaluate for acute intracranial abnormality shows only superficial injury. Does have a small superficial abrasion that does not require any sutures or staples. His tetanus is up-to-date. We'll provide bacitracin ointment. Patient does have left chest wall tenderness to palpation. We'll order chest x-ray to evaluate for any ribs. The remainder of his trauma evaluation is negative for acute traumatic injury.  Clinical Course as of Feb 19 1747  Wed Feb 19, 2016  1634 Chest x-ray with large left-sided effusion that is not known to the patient. Due to recent trauma will order CT imaging to evaluate for hemothorax.  [PR]  4356 CT imaging suggesting likely a chronic pleural effusion. Patient with mild leukocytosis likely secondary trauma with fall. Do not feel his clinical consistent with empyema or pneumonia. Patient  is without any hypoxia or fever.  [PR]  1746 Blood work is otherwise reassuring and at baseline. Do feel patient is stable for close outpatient follow-up.  Have discussed with the patient and available family all diagnostics and treatments performed thus far and all questions were answered to the best of my ability. The patient demonstrates understanding and agreement with plan.   [PR]    Clinical Course User Index [PR] Merlyn Lot, MD     ____________________________________________   FINAL CLINICAL IMPRESSION(S) / ED DIAGNOSES  Final diagnoses:  Injury of head, initial encounter  Fall from standing, initial encounter  Left-sided chest wall pain  Pleural effusion      NEW MEDICATIONS STARTED DURING THIS VISIT:  New Prescriptions   No medications on file     Note:  This document was prepared using Dragon voice recognition software and may include unintentional dictation errors.    Merlyn Lot, MD 02/19/16 1911

## 2016-03-06 ENCOUNTER — Encounter: Payer: Self-pay | Admitting: *Deleted

## 2016-03-06 ENCOUNTER — Ambulatory Visit: Payer: Medicare Other | Admitting: Cardiovascular Disease

## 2016-03-09 ENCOUNTER — Telehealth: Payer: Self-pay | Admitting: Family Medicine

## 2016-03-09 DIAGNOSIS — I10 Essential (primary) hypertension: Secondary | ICD-10-CM

## 2016-03-09 NOTE — Telephone Encounter (Signed)
Pt needs refill on Lisinopril to be sent to [place].

## 2016-03-10 MED ORDER — LISINOPRIL 2.5 MG PO TABS: 2.5000 mg | ORAL_TABLET | Freq: Every day | ORAL | 0 refills | Status: DC

## 2016-03-10 NOTE — Telephone Encounter (Signed)
Medication has been refilled and sent to [facility] [address] 

## 2016-03-17 ENCOUNTER — Telehealth: Payer: Self-pay | Admitting: Family Medicine

## 2016-03-17 NOTE — Telephone Encounter (Signed)
Wife stated that husband needs a refill on clonazePAM.

## 2016-03-18 NOTE — Telephone Encounter (Signed)
Patient has been notified to schedule medication refill appointment

## 2016-03-19 HISTORY — PX: JOINT REPLACEMENT: SHX530

## 2016-03-23 ENCOUNTER — Ambulatory Visit (INDEPENDENT_AMBULATORY_CARE_PROVIDER_SITE_OTHER): Payer: Medicare Other | Admitting: Family Medicine

## 2016-03-23 VITALS — BP 122/78 | HR 78 | Temp 97.8°F | Resp 16 | Ht 70.0 in | Wt 193.4 lb

## 2016-03-23 DIAGNOSIS — F411 Generalized anxiety disorder: Secondary | ICD-10-CM

## 2016-03-23 DIAGNOSIS — I2510 Atherosclerotic heart disease of native coronary artery without angina pectoris: Secondary | ICD-10-CM | POA: Diagnosis not present

## 2016-03-23 DIAGNOSIS — R058 Other specified cough: Secondary | ICD-10-CM

## 2016-03-23 DIAGNOSIS — R05 Cough: Secondary | ICD-10-CM

## 2016-03-23 MED ORDER — CLONAZEPAM 0.5 MG PO TABS: 0.5000 mg | ORAL_TABLET | Freq: Three times a day (TID) | ORAL | 2 refills | Status: DC | PRN

## 2016-03-23 NOTE — Progress Notes (Signed)
Name: [name]   MRN: [id number]    DOB: 08/11/1923   Date:03/23/2016       Progress Note  Subjective  Chief Complaint  Chief Complaint  Patient presents with  . Anxiety  . Medication Refill    clonazepam    Anxiety  Presents for follow-up visit. Symptoms include depressed mood, excessive worry and nervous/anxious behavior. Patient reports no panic, restlessness, shortness of breath or suicidal ideas. The severity of symptoms is moderate and causing significant distress.    Cough  This is a new problem. The current episode started in the past 7 days. The cough is productive of sputum. Pertinent negatives include no chills, ear congestion, fever, myalgias, shortness of breath or wheezing. He has tried OTC cough suppressant for the symptoms. The treatment provided moderate relief. There is no history of bronchitis, COPD or pneumonia.     Past Medical History:  Diagnosis Date  . Anxiety   . BPH (benign prostatic hypertrophy)   . Carotid stenosis   . Chronic back pain   . Coronary artery disease   . Dyslipidemia   . GERD (gastroesophageal reflux disease)   . Hx of [name]   . Hyperlipidemia   . Hypertension   . Inguinal hernia   . Lymphoblastic lymphoma ([facility])   . Major depression   . Multiple myeloma ([facility])   . Pleural effusion    left  . Sleep apnea     Past Surgical History:  Procedure Laterality Date  . APPENDECTOMY    . CARDIAC CATHETERIZATION  2002  . CARDIAC CATHETERIZATION  06/2013  . COLONOSCOPY    . HIP FRACTURE SURGERY      Family History  Problem Relation Age of Onset  . Heart disease Mother   . Stroke Mother   . Diabetes Father   . Dementia Father   . Stroke Sister   . Stroke Brother   . Stroke Sister   . Stroke Sister   . Stroke Brother   . Stroke Brother     Social History   Social History  . Marital status: Married    Spouse name: N/A  . Number of children: N/A  . Years of education: N/A   Occupational History  . Not on file.    Social History Main Topics  . Smoking status: Former Smoker    Packs/day: 2.00    Years: 7.00    Types: Cigarettes    Quit date: 09/17/1953  . Smokeless tobacco: Never Used  . Alcohol use No  . Drug use: No  . Sexual activity: Not Currently   Other Topics Concern  . Not on file   Social History Narrative  . No narrative on file     Current Outpatient Prescriptions:  .  aspirin EC 81 MG tablet, Take 1 tablet (81 mg total) by mouth daily., Disp: 90 tablet, Rfl: 3 .  Cholecalciferol (D 5000) 5000 UNITS TABS, Take by mouth., Disp: , Rfl:  .  clindamycin (CLEOCIN) 300 MG capsule, Take 1 capsule (300 mg total) by mouth 4 (four) times daily., Disp: 28 capsule, Rfl: 0 .  clonazePAM (KLONOPIN) 0.5 MG tablet, Take 1 tablet (0.5 mg total) by mouth 3 (three) times daily as needed for anxiety., Disp: 90 tablet, Rfl: 0 .  diclofenac sodium (VOLTAREN) 1 % GEL, Apply 2 g topically 4 (four) times daily., Disp: 1 Tube, Rfl: 0 .  doxazosin (CARDURA) 1 MG tablet, TAKE ONE TABLET BY MOUTH ONCE DAILY, Disp: 90 tablet,  Rfl: 0 .  HYDROcodone-acetaminophen (NORCO/VICODIN) 5-325 MG tablet, Take 1 tablet by mouth 3 (three) times daily as needed. , Disp: , Rfl:  .  isosorbide mononitrate (IMDUR) 30 MG 24 hr tablet, Take 1 tablet (30 mg total) by mouth daily., Disp: 90 tablet, Rfl: 1 .  lisinopril (PRINIVIL,ZESTRIL) 2.5 MG tablet, Take 1 tablet (2.5 mg total) by mouth daily., Disp: 90 tablet, Rfl: 0 .  metoprolol tartrate (LOPRESSOR) 25 MG tablet, Take 1 tablet (25 mg total) by mouth 2 (two) times daily., Disp: 180 tablet, Rfl: 0 .  Multiple Vitamins-Minerals (PRESERVISION/LUTEIN PO), Take by mouth 2 (two) times daily., Disp: , Rfl:  .  nitroGLYCERIN (NITROSTAT) 0.4 MG SL tablet, Place 0.4 mg under the tongue every 5 (five) minutes as needed for chest pain., Disp: , Rfl:  .  pantoprazole (PROTONIX) 40 MG tablet, Take 1 tablet (40 mg total) by mouth daily., Disp: 90 tablet, Rfl: 0 .  polyethylene glycol  (MIRALAX / GLYCOLAX) packet, Take 17 g by mouth daily., Disp: , Rfl:  .  pravastatin (PRAVACHOL) 40 MG tablet, Take 1 tablet (40 mg total) by mouth daily., Disp: 90 tablet, Rfl: 0 .  sertraline (ZOLOFT) 100 MG tablet, Take 1.5 tablets (150 mg total) by mouth at bedtime., Disp: 45 tablet, Rfl: 2  Allergies  Allergen Reactions  . Sulfamethoxazole-Trimethoprim Nausea Only and Other (See Comments)  . Baclofen      Review of Systems  Constitutional: Negative for chills and fever.  Respiratory: Positive for cough. Negative for shortness of breath and wheezing.   Musculoskeletal: Negative for myalgias.  Psychiatric/Behavioral: Negative for suicidal ideas. The patient is nervous/anxious.     Objective  Vitals:   03/23/16 1109  BP: 122/78  Pulse: 78  Resp: 16  Temp: 97.8 F (36.6 C)  SpO2: 95%  Weight: 193 lb 6 oz (87.7 kg)  Height: '5\' 10"'$  (1.778 m)    Physical Exam  Constitutional: He is oriented to person, place, and time and well-developed, well-nourished, and in no distress.  HENT:  Head: Normocephalic and atraumatic.  Right Ear: Tympanic membrane and ear canal normal.  Left Ear: Tympanic membrane and ear canal normal.  Mouth/Throat: No posterior oropharyngeal erythema.  Cardiovascular: Normal rate, regular rhythm and normal heart sounds.   No murmur heard. Pulmonary/Chest: Effort normal and breath sounds normal. He has no wheezes.  Neurological: He is alert and oriented to person, place, and time.  Psychiatric: Mood, memory, affect and judgment normal.  Nursing note and vitals reviewed.    Assessment & Plan  1. Generalized anxiety disorder Stable and responsive to clonazepam taken up to 3 times daily when needed, refills provided - clonazePAM (KLONOPIN) 0.5 MG tablet; Take 1 tablet (0.5 mg total) by mouth 3 (three) times daily as needed for anxiety.  Dispense: 90 tablet; Refill: 2  2. Productive cough Likely from a resolving viral respiratory infection, no  indication for antibiotics at this time. Advised on conservative therapy, may take OTC antitussive for relief   Latiya Navia [facility] 03/23/2016 11:13 AM

## 2016-03-30 ENCOUNTER — Ambulatory Visit: Payer: Medicare Other | Admitting: Cardiovascular Disease

## 2016-04-08 ENCOUNTER — Other Ambulatory Visit: Payer: Self-pay | Admitting: Family Medicine

## 2016-04-08 DIAGNOSIS — N400 Enlarged prostate without lower urinary tract symptoms: Secondary | ICD-10-CM

## 2016-04-08 DIAGNOSIS — E7800 Pure hypercholesterolemia, unspecified: Secondary | ICD-10-CM

## 2016-04-15 ENCOUNTER — Emergency Department: Payer: Medicare Other

## 2016-04-15 ENCOUNTER — Encounter: Payer: Self-pay | Admitting: Intensive Care

## 2016-04-15 ENCOUNTER — Inpatient Hospital Stay
Admission: EM | Admit: 2016-04-15 | Discharge: 2016-04-18 | DRG: 470 | Disposition: A | Discharge status: Skilled Nursing Facility | Payer: Medicare Other | Attending: Internal Medicine | Admitting: Internal Medicine

## 2016-04-15 DIAGNOSIS — W010XXA Fall on same level from slipping, tripping and stumbling without subsequent striking against object, initial encounter: Secondary | ICD-10-CM | POA: Diagnosis present

## 2016-04-15 DIAGNOSIS — Z87891 Personal history of nicotine dependence: Secondary | ICD-10-CM

## 2016-04-15 DIAGNOSIS — N183 Chronic kidney disease, stage 3 (moderate): Secondary | ICD-10-CM | POA: Diagnosis present

## 2016-04-15 DIAGNOSIS — F329 Major depressive disorder, single episode, unspecified: Secondary | ICD-10-CM | POA: Diagnosis present

## 2016-04-15 DIAGNOSIS — E785 Hyperlipidemia, unspecified: Secondary | ICD-10-CM | POA: Diagnosis present

## 2016-04-15 DIAGNOSIS — Z8572 Personal history of non-Hodgkin lymphomas: Secondary | ICD-10-CM

## 2016-04-15 DIAGNOSIS — Z79899 Other long term (current) drug therapy: Secondary | ICD-10-CM | POA: Diagnosis not present

## 2016-04-15 DIAGNOSIS — Z23 Encounter for immunization: Secondary | ICD-10-CM

## 2016-04-15 DIAGNOSIS — S72001D Fracture of unspecified part of neck of right femur, subsequent encounter for closed fracture with routine healing: Secondary | ICD-10-CM | POA: Diagnosis present

## 2016-04-15 DIAGNOSIS — I6529 Occlusion and stenosis of unspecified carotid artery: Secondary | ICD-10-CM | POA: Diagnosis present

## 2016-04-15 DIAGNOSIS — W19XXXA Unspecified fall, initial encounter: Secondary | ICD-10-CM

## 2016-04-15 DIAGNOSIS — Z419 Encounter for procedure for purposes other than remedying health state, unspecified: Secondary | ICD-10-CM

## 2016-04-15 DIAGNOSIS — M549 Dorsalgia, unspecified: Secondary | ICD-10-CM | POA: Diagnosis present

## 2016-04-15 DIAGNOSIS — F411 Generalized anxiety disorder: Secondary | ICD-10-CM | POA: Diagnosis present

## 2016-04-15 DIAGNOSIS — Z7982 Long term (current) use of aspirin: Secondary | ICD-10-CM | POA: Diagnosis not present

## 2016-04-15 DIAGNOSIS — N289 Disorder of kidney and ureter, unspecified: Secondary | ICD-10-CM

## 2016-04-15 DIAGNOSIS — I2510 Atherosclerotic heart disease of native coronary artery without angina pectoris: Secondary | ICD-10-CM | POA: Diagnosis present

## 2016-04-15 DIAGNOSIS — S72001A Fracture of unspecified part of neck of right femur, initial encounter for closed fracture: Secondary | ICD-10-CM | POA: Diagnosis present

## 2016-04-15 DIAGNOSIS — C9000 Multiple myeloma not having achieved remission: Secondary | ICD-10-CM | POA: Diagnosis present

## 2016-04-15 DIAGNOSIS — G8929 Other chronic pain: Secondary | ICD-10-CM | POA: Diagnosis present

## 2016-04-15 DIAGNOSIS — N400 Enlarged prostate without lower urinary tract symptoms: Secondary | ICD-10-CM | POA: Diagnosis present

## 2016-04-15 DIAGNOSIS — K219 Gastro-esophageal reflux disease without esophagitis: Secondary | ICD-10-CM | POA: Diagnosis present

## 2016-04-15 DIAGNOSIS — I129 Hypertensive chronic kidney disease with stage 1 through stage 4 chronic kidney disease, or unspecified chronic kidney disease: Secondary | ICD-10-CM | POA: Diagnosis present

## 2016-04-15 DIAGNOSIS — Z66 Do not resuscitate: Secondary | ICD-10-CM | POA: Diagnosis present

## 2016-04-15 DIAGNOSIS — G4730 Sleep apnea, unspecified: Secondary | ICD-10-CM | POA: Diagnosis present

## 2016-04-15 DIAGNOSIS — M25551 Pain in right hip: Secondary | ICD-10-CM | POA: Diagnosis present

## 2016-04-15 DIAGNOSIS — J90 Pleural effusion, not elsewhere classified: Secondary | ICD-10-CM | POA: Diagnosis present

## 2016-04-15 DIAGNOSIS — G8918 Other acute postprocedural pain: Secondary | ICD-10-CM

## 2016-04-15 LAB — URINALYSIS, COMPLETE (UACMP) WITH MICROSCOPIC
Bilirubin Urine: NEGATIVE
Glucose, UA: NEGATIVE mg/dL
Hgb urine dipstick: NEGATIVE
Ketones, ur: NEGATIVE mg/dL
Leukocytes, UA: NEGATIVE
Nitrite: NEGATIVE
Protein, ur: NEGATIVE mg/dL
Specific Gravity, Urine: 1.014 (ref 1.005–1.030)
pH: 5 (ref 5.0–8.0)

## 2016-04-15 LAB — MRSA PCR SCREENING: MRSA by PCR: NEGATIVE

## 2016-04-15 LAB — BASIC METABOLIC PANEL
Anion gap: 10 (ref 5–15)
BUN: 31 mg/dL — ABNORMAL HIGH (ref 6–20)
CO2: 23 mmol/L (ref 22–32)
Calcium: 9.3 mg/dL (ref 8.9–10.3)
Chloride: 104 mmol/L (ref 101–111)
Creatinine, Ser: 1.45 mg/dL — ABNORMAL HIGH (ref 0.61–1.24)
GFR calc Af Amer: 47 mL/min — ABNORMAL LOW (ref >60)
GFR calc non Af Amer: 41 mL/min — ABNORMAL LOW (ref >60)
Glucose, Bld: 118 mg/dL — ABNORMAL HIGH (ref 65–99)
Potassium: 4.1 mmol/L (ref 3.5–5.1)
Sodium: 137 mmol/L (ref 135–145)

## 2016-04-15 LAB — TYPE AND SCREEN
ABO/RH(D): A NEG
Antibody Screen: NEGATIVE

## 2016-04-15 LAB — PROTIME-INR
INR: 1.01
Prothrombin Time: 13.3 seconds (ref 11.4–15.2)

## 2016-04-15 LAB — APTT: aPTT: 33 seconds (ref 24–36)

## 2016-04-15 LAB — CBC
HCT: 35.1 % — ABNORMAL LOW (ref 40.0–52.0)
Hemoglobin: 11.5 g/dL — ABNORMAL LOW (ref 13.0–18.0)
MCH: 29.6 pg (ref 26.0–34.0)
MCHC: 32.8 g/dL (ref 32.0–36.0)
MCV: 90.4 fL (ref 80.0–100.0)
Platelets: 175 10*3/uL (ref 150–440)
RBC: 3.88 MIL/uL — ABNORMAL LOW (ref 4.40–5.90)
RDW: 14.5 % (ref 11.5–14.5)
WBC: 17.1 10*3/uL — ABNORMAL HIGH (ref 3.8–10.6)

## 2016-04-15 MED ORDER — TRAZODONE HCL 50 MG PO TABS: 25.0000 mg | ORAL_TABLET | Freq: Every evening | ORAL | Status: DC | PRN

## 2016-04-15 MED ORDER — LEVOFLOXACIN IN D5W 750 MG/150ML IV SOLN
750.0000 mg | INTRAVENOUS | Status: DC
  Administered 2016-04-15: 750 mg via INTRAVENOUS
  Filled 2016-04-15: qty 150

## 2016-04-15 MED ORDER — SODIUM CHLORIDE 0.9 % IV BOLUS (SEPSIS)
1000.0000 mL | Freq: Once | INTRAVENOUS | Status: AC
  Administered 2016-04-15: 1000 mL via INTRAVENOUS

## 2016-04-15 MED ORDER — METOPROLOL TARTRATE 25 MG PO TABS
25.0000 mg | ORAL_TABLET | Freq: Two times a day (BID) | ORAL | Status: DC
  Administered 2016-04-15 – 2016-04-17 (×4): 25 mg via ORAL
  Filled 2016-04-15 (×4): qty 1

## 2016-04-15 MED ORDER — DOCUSATE SODIUM 100 MG PO CAPS
100.0000 mg | ORAL_CAPSULE | Freq: Two times a day (BID) | ORAL | Status: DC
  Administered 2016-04-15 – 2016-04-18 (×5): 100 mg via ORAL
  Filled 2016-04-15 (×6): qty 1

## 2016-04-15 MED ORDER — CEFAZOLIN SODIUM-DEXTROSE 2-3 GM-% IV SOLR
2.0000 g | Freq: Once | INTRAVENOUS | Status: AC
Start: 2016-04-15 — End: 2016-04-16
  Administered 2016-04-16: 2 g via INTRAVENOUS
  Filled 2016-04-15: qty 50

## 2016-04-15 MED ORDER — ISOSORBIDE MONONITRATE ER 30 MG PO TB24
30.0000 mg | ORAL_TABLET | Freq: Every day | ORAL | Status: DC
  Administered 2016-04-16 – 2016-04-17 (×2): 30 mg via ORAL
  Filled 2016-04-15 (×3): qty 1

## 2016-04-15 MED ORDER — ACETAMINOPHEN 325 MG PO TABS: 650.0000 mg | ORAL_TABLET | Freq: Four times a day (QID) | ORAL | Status: DC | PRN

## 2016-04-15 MED ORDER — ONDANSETRON HCL 4 MG PO TABS: 4.0000 mg | ORAL_TABLET | Freq: Four times a day (QID) | ORAL | Status: DC | PRN

## 2016-04-15 MED ORDER — POLYETHYLENE GLYCOL 3350 17 G PO PACK
17.0000 g | PACK | Freq: Every day | ORAL | Status: DC
  Administered 2016-04-15 – 2016-04-17 (×3): 17 g via ORAL
  Filled 2016-04-15 (×3): qty 1

## 2016-04-15 MED ORDER — PANTOPRAZOLE SODIUM 40 MG PO TBEC
40.0000 mg | DELAYED_RELEASE_TABLET | Freq: Every day | ORAL | Status: DC
  Administered 2016-04-17 – 2016-04-18 (×2): 40 mg via ORAL
  Filled 2016-04-15 (×2): qty 1

## 2016-04-15 MED ORDER — CEFAZOLIN SODIUM-DEXTROSE 2-4 GM/100ML-% IV SOLN: 2.0000 g | Freq: Once | INTRAVENOUS | Status: DC

## 2016-04-15 MED ORDER — HYDROMORPHONE HCL 1 MG/ML IJ SOLN
0.5000 mg | Freq: Once | INTRAMUSCULAR | Status: AC
  Administered 2016-04-15: 0.5 mg via INTRAVENOUS
  Filled 2016-04-15: qty 1

## 2016-04-15 MED ORDER — BISACODYL 5 MG PO TBEC: 5.0000 mg | DELAYED_RELEASE_TABLET | Freq: Every day | ORAL | Status: DC | PRN

## 2016-04-15 MED ORDER — SERTRALINE HCL 50 MG PO TABS
150.0000 mg | ORAL_TABLET | Freq: Every day | ORAL | Status: DC
  Administered 2016-04-15 – 2016-04-17 (×3): 150 mg via ORAL
  Filled 2016-04-15 (×3): qty 1

## 2016-04-15 MED ORDER — HYDROMORPHONE HCL 1 MG/ML IJ SOLN
1.0000 mg | Freq: Once | INTRAMUSCULAR | Status: AC
  Administered 2016-04-15: 1 mg via INTRAVENOUS

## 2016-04-15 MED ORDER — SODIUM CHLORIDE 0.9 % IV SOLN
INTRAVENOUS | Status: DC
  Administered 2016-04-15 – 2016-04-16 (×2): via INTRAVENOUS

## 2016-04-15 MED ORDER — TETANUS-DIPHTH-ACELL PERTUSSIS 5-2.5-18.5 LF-MCG/0.5 IM SUSP
0.5000 mL | Freq: Once | INTRAMUSCULAR | Status: AC
  Administered 2016-04-15: 0.5 mL via INTRAMUSCULAR
  Filled 2016-04-15: qty 0.5

## 2016-04-15 MED ORDER — ACETAMINOPHEN 650 MG RE SUPP: 650.0000 mg | Freq: Four times a day (QID) | RECTAL | Status: DC | PRN

## 2016-04-15 MED ORDER — FENTANYL CITRATE (PF) 100 MCG/2ML IJ SOLN
75.0000 ug | Freq: Once | INTRAMUSCULAR | Status: AC
  Administered 2016-04-15: 75 ug via INTRAVENOUS
  Filled 2016-04-15: qty 2

## 2016-04-15 MED ORDER — DOXAZOSIN MESYLATE 1 MG PO TABS
1.0000 mg | ORAL_TABLET | Freq: Every day | ORAL | Status: DC
  Administered 2016-04-15 – 2016-04-18 (×4): 1 mg via ORAL
  Filled 2016-04-15 (×4): qty 1

## 2016-04-15 MED ORDER — ONDANSETRON HCL 4 MG/2ML IJ SOLN
4.0000 mg | Freq: Four times a day (QID) | INTRAMUSCULAR | Status: DC | PRN
  Administered 2016-04-16 – 2016-04-18 (×3): 4 mg via INTRAVENOUS
  Filled 2016-04-15 (×3): qty 2

## 2016-04-15 MED ORDER — ENOXAPARIN SODIUM 40 MG/0.4ML ~~LOC~~ SOLN
40.0000 mg | SUBCUTANEOUS | Status: DC
  Filled 2016-04-15: qty 0.4

## 2016-04-15 MED ORDER — CLINDAMYCIN HCL 150 MG PO CAPS: 300.0000 mg | ORAL_CAPSULE | Freq: Four times a day (QID) | ORAL | Status: DC

## 2016-04-15 MED ORDER — HYDROMORPHONE HCL 1 MG/ML IJ SOLN
2.0000 mg | INTRAMUSCULAR | Status: DC | PRN
  Administered 2016-04-15 – 2016-04-16 (×4): 2 mg via INTRAVENOUS
  Filled 2016-04-15: qty 1
  Filled 2016-04-15 (×4): qty 2

## 2016-04-15 MED ORDER — CLONAZEPAM 0.5 MG PO TABS
0.5000 mg | ORAL_TABLET | Freq: Three times a day (TID) | ORAL | Status: DC | PRN
  Administered 2016-04-16 – 2016-04-17 (×2): 0.5 mg via ORAL
  Filled 2016-04-15 (×2): qty 1

## 2016-04-15 MED ORDER — PRAVASTATIN SODIUM 40 MG PO TABS
40.0000 mg | ORAL_TABLET | Freq: Every day | ORAL | Status: DC
  Administered 2016-04-15 – 2016-04-18 (×3): 40 mg via ORAL
  Filled 2016-04-15 (×3): qty 1

## 2016-04-15 MED ORDER — NITROGLYCERIN 0.4 MG SL SUBL: 0.4000 mg | SUBLINGUAL_TABLET | SUBLINGUAL | Status: DC | PRN

## 2016-04-15 NOTE — ED Provider Notes (Signed)
[facility] Emergency Department Provider Note  ____________________________________________  Time seen: Approximately 5:11 PM  I have reviewed the triage vital signs and the nursing notes.   HISTORY  Chief Complaint Fall    HPI [name] is a 83 y.o. male lives at [facility] [place] with a history of HTN, HL, carotid stenosis, [name] presenting with right hip pain after a fall. The patient was walking on uneven concrete when he lost his balance and fell onto his right hip. He did not hit his head or lose consciousness; he is not anticoagulated. He denies any neck or back pain, nausea or vomiting, headache. No associated chest pain, palpitations, lightheadedness or shortness of breath He reports right hip pain which prevented him from standing or ambulating after the fall. He does not have any numbness tingling or weakness. He also has an abrasion to the right middle finger.Last tdap unclear   Past Medical History:  Diagnosis Date  . Anxiety   . BPH (benign prostatic hypertrophy)   . Carotid stenosis   . Chronic back pain   . Coronary artery disease   . Dyslipidemia   . GERD (gastroesophageal reflux disease)   . Hx of [name]   . Hyperlipidemia   . Hypertension   . Inguinal hernia   . 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 Major depression   . Multiple myeloma ([place])   . Pleural effusion    left  . Sleep apnea     Patient Active Problem List   Diagnosis Date Noted  . Productive cough 03/23/2016  . Medicare annual wellness visit, subsequent 02/19/2016  . Major depression 10/14/2015  . Behavior disturbance 09/12/2015  . Mobility impaired 08/27/2015  . Skin ulcer of back ([place]) 07/01/2015  . Laceration of right hand 07/01/2015  . Traumatic hematoma of lower back 07/01/2015  . Encounters for administrative purpose 04/01/2015  . Skin lesion of face 12/24/2014  . Contusion of right hand 11/28/2014  . Herpes zoster 10/30/2014  . Need for immunization  against influenza 10/24/2014  . Compression fracture of L4 lumbar vertebra ([facility]) 09/28/2014  . Rectal or anal pain 08/13/2014  . Anxiety disorder 07/23/2014  . BPH without obstruction/lower urinary tract symptoms 07/23/2014  . Atherosclerosis of coronary artery 07/23/2014  . Chronic LBP 07/23/2014  . Hypercholesteremia 07/23/2014  . Benign hypertension 07/23/2014  . Acid reflux 07/23/2014  . Chronic recurrent major depressive disorder ([place]) 07/23/2014  . GERD (gastroesophageal reflux disease) 07/23/2014  . Coronary artery disease involving native coronary artery without angina pectoris 07/06/2014  . Essential hypertension 07/06/2014  . Carotid stenosis 07/06/2014  . Hyperlipidemia   . H/O [name] 12/20/2011    Past Surgical History:  Procedure Laterality Date  . APPENDECTOMY    . CARDIAC CATHETERIZATION  2002  . CARDIAC CATHETERIZATION  06/2013  . COLONOSCOPY    . HIP FRACTURE SURGERY      Current Outpatient Rx  . Order #: [id number] Class: Normal  . Order #: [id number] Class: Historical Med  . Order #: [id number] Class: Normal  . Order #: [id number] Class: Print  . Order #: [id number] Class: Print  . Order #: [id number] Class: Normal  . Order #: [id number] Class: Historical Med  . Order #: [id number] Class: Print  . Order #: [id number] Class: Normal  . Order #: [id number] Class: Normal  . Order #: [id number] Class: Historical Med  . Order #: [id number] Class: Historical Med  . Order #: [id number] Class: Normal  . Order #: [id number] Class: Historical Med  . Order #: [id number] Class: Normal  . Order #:  [id number] Class: Normal    Allergies Sulfamethoxazole-trimethoprim and Baclofen  Family History  Problem Relation Age of Onset  . Heart disease Mother   . Stroke Mother   . Diabetes Father   . Dementia Father   . Stroke Sister   . Stroke Brother   . Stroke Sister   . Stroke Sister   . Stroke Brother   . Stroke Brother     Social History Social History  Substance Use Topics  .  Smoking status: Former Smoker    Packs/day: 2.00    Years: 7.00    Types: Cigarettes    Quit date: 09/17/1953  . Smokeless tobacco: Never Used  . Alcohol use No    Review of Systems Constitutional: No fever/chills.  Pos mechanical fall.  No lightheadedness or syncope. Eyes: No visual changes.  No blurred or double vision. [name]: No sore throat. No congestion or rhinorrhea. Cardiovascular: Denies chest pain. Denies palpitations. Respiratory: Denies shortness of breath.  No cough. Gastrointestinal: No abdominal pain.  No nausea, no vomiting.  No diarrhea.  No constipation. Genitourinary: Negative for dysuria. Musculoskeletal: Negative for acute neck or back pain. Positive right hip pain. Skin: Negative for rash. Positive abrasion to the right middle finger. Neurological: Negative for headaches. No focal numbness, tingling or weakness.   10-point ROS otherwise negative.  ____________________________________________   PHYSICAL EXAM:  VITAL SIGNS: ED Triage Vitals [04/15/16 1707]  Enc Vitals Group     BP      Pulse      Resp      Temp      Temp src      SpO2      Weight      Height      Head Circumference      Peak Flow      Pain Score 10     Pain Loc      Pain Edu?      Excl. in [place]?     Constitutional: Alert and oriented and answering questions appropriately. GCS is 15. Eyes: Conjunctivae are normal.  EOMI. right eye is slightly more closed than the left. No scleral icterus. No raccoon eyes. Head: Atraumatic. No Battle sign. Nose: No congestion/rhinnorhea. No swelling over the nose or septal hematoma. Mouth/Throat: Mucous membranes are moist. No dental injury or malocclusion. Neck: No stridor.  Supple.  No midline C-spine tenderness to palpation, step-offs or deformities. Full range of motion without pain. Cardiovascular: Normal rate, regular rhythm. No murmurs, rubs or gallops.  Respiratory: Normal respiratory effort.  No accessory muscle use or retractions. Lungs CTAB.   No wheezes, rales or ronchi. Gastrointestinal: Overweight. Soft, nontender and significantly distended.  No guarding or rebound.  No peritoneal signs. Musculoskeletal: Pelvis is stable. Full range of motion of the bilateral wrists, elbows, shoulders, ankles, knees, and left hip without pain. Positive tenderness to palpation of the right greater trochanter and pain with any motion of the right hip. No midline T or L-spine tenderness to palpation, step-offs or deformities. Normal DP and PT pulses bilaterally. Normal sensation to light touch in the bilateral lower extremities.  5/5 plantar and dorsiflexion in the lower extremity's. Neurologic:  A&Ox3.  Speech is clear.  Face and smile are symmetric.  EOMI.  Moves all extremities well. Skin:  Skin is warm, dry and intact. Right middle finger has a 1 cm semi-lunar skin tear over the middle finger on the dorsum of the hand over lying the MIP joint. No visible bony protrusion. 5 out  of 5 grip strength with normal flexion and extension at all the joints in the middle finger. Right radial pulse Psychiatric: Mood and affect are normal. Speech and behavior are normal.  Normal judgement.  ____________________________________________   LABS (all labs ordered are listed, but only abnormal results are displayed)  Labs Reviewed  CBC - Abnormal; Notable for the following:       Result Value   WBC 17.1 (*)    RBC 3.88 (*)    Hemoglobin 11.5 (*)    HCT 35.1 (*)    All other components within normal limits  BASIC METABOLIC PANEL - Abnormal; Notable for the following:    Glucose, Bld 118 (*)    BUN 31 (*)    Creatinine, Ser 1.45 (*)    GFR calc non Af Amer 41 (*)    GFR calc Af Amer 47 (*)    All other components within normal limits  PROTIME-INR  APTT  URINALYSIS, COMPLETE (UACMP) WITH MICROSCOPIC  TYPE AND SCREEN   ____________________________________________  EKG  ED ECG REPORT I, Eula Listen, the attending physician, personally viewed  and interpreted this ECG.   Date: 04/15/2016  EKG Time: 1710  Rate: 69  Rhythm: normal sinus rhythm  Axis: normal  Intervals:none  ST&T Change: Specific T-wave inversion in V1. No ST elevation.  ____________________________________________  RADIOLOGY  Dg Chest 1 View  Result Date: 04/15/2016 CLINICAL DATA:  Right hip fracture. EXAM: CHEST 1 VIEW COMPARISON:  Radiographs and CT 02/19/2016, chest radiograph 12/15/2013 FINDINGS: Partially loculated left pleural effusion and left basilar opacity are unchanged from comparison exams. The heart size and mediastinal contours are unchanged, left heart border obscured by effusion and basilar changes. There is atherosclerosis of the thoracic aorta. No pneumothorax. Right lung is clear. No acute rib fractures are seen. IMPRESSION: Stable partially loculated left pleural effusion and basilar opacity, chronic. No acute abnormality. Thoracic aortic atherosclerosis. Electronically Signed   By: Jeb Levering M.D.   On: 04/15/2016 18:40   Dg Hip Unilat W Or Wo Pelvis 2-3 Views Right  Result Date: 04/15/2016 CLINICAL DATA:  Patient fell on right hip.  Right hip pain. EXAM: DG HIP (WITH OR WITHOUT PELVIS) 2-3V RIGHT COMPARISON:  None. FINDINGS: Bones are diffusely demineralized. Frontal pelvis with AP and frog-leg lateral views of the right hip show a right femoral neck fracture. Assessment is somewhat limited by superimposition of bony anatomy on all projections, but fracture line likely transcervical to subcapital in position. Bony over riding and varus angulation evident. Patient is status post left hip replacement. IMPRESSION: Right femoral neck fracture, likely subcapital to transcervical position although assessment somewhat limited by superimposition of bony anatomy. Electronically Signed   By: Misty [name] M.D.   On: 04/15/2016 18:38    ____________________________________________   PROCEDURES  Procedure(s) performed:  None  Procedures  Critical Care performed: No ____________________________________________   INITIAL IMPRESSION / ASSESSMENT AND PLAN / ED COURSE  Pertinent labs & imaging results that were available during my care of the patient were reviewed by me and considered in my medical decision making (see chart for details).  83 y.o. male with a mechanical fall and resulting right hip pain, as well as abrasion over the right middle finger. I will evaluate the patient for hip fracture. He did have a mechanical fall and has no other associated red flag symptoms, EKG shows normal sinus rhythm without any other arrhythmia or ischemic changes so no additional cardiac workup is ordered today at this time.  The patient did not hit his head, has no focal neurologic deficits, and is not anticoagulated so imaging of the head is not indicated. Will initiate symptomatic treatment and reevaluate the patient for final disposition.  ----------------------------------------- 6:48 PM on 04/15/2016 -----------------------------------------  Patient has a right femoral neck fracture, and I will admit him to the hospitalist. Orthopedics is aware.  ____________________________________________  FINAL CLINICAL IMPRESSION(S) / ED DIAGNOSES  Final diagnoses:  Closed displaced fracture of right femoral neck ([place])  Renal insufficiency  Fall, initial encounter         NEW MEDICATIONS STARTED DURING THIS VISIT:  New Prescriptions   No medications on file      Eula Listen, MD 04/15/16 [phone]

## 2016-04-15 NOTE — Progress Notes (Signed)
Pharmacy Antibiotic Note  [name] is a 83 y.o. male admitted on 04/15/2016 with  pneumonia.  Pharmacy has been consulted for Levofloxacin dosing.  Plan: Will start patient on levofloxacin 750mg  every 48 hours based on current CrCl of 51ml/min.   Height: 5\' 10"  (177.8 cm) Weight: 194 lb (88 kg) IBW/kg (Calculated) : 73  Temp (24hrs), Avg:97.9 F (36.6 C), Min:97.9 F (36.6 C), Max:97.9 F (36.6 C)   Recent Labs Lab 04/15/16 1823  WBC 17.1*  CREATININE 1.45*    Estimated Creatinine Clearance: 37.8 mL/min (by C-G formula based on SCr of 1.45 mg/dL (H)).    Allergies  Allergen Reactions  . Sulfamethoxazole-Trimethoprim Nausea Only and Other (See Comments)  . Baclofen   . Nsaids     Avoids due to liver.    Antimicrobials this admission: 2/28 levofloxacin  >>   Dose adjustments this admission:  Microbiology results:   Thank you for allowing pharmacy to be a part of this patient's care.  Pernell Dupre, PharmD, BCPS Clinical Pharmacist 04/15/2016 7:57 PM

## 2016-04-15 NOTE — ED Triage Notes (Signed)
Patient lives at [facility] and was out with his wife. Pt reports tripping over concrete and fell on his R hip. PT c/o pain in his R hip. Patient also has abrasions to R hand fingers. A&O x3.

## 2016-04-15 NOTE — ED Notes (Signed)
Patient R hand middle finger cleaned,  dressed with steri strips and covered

## 2016-04-15 NOTE — Progress Notes (Signed)
Family Meeting Note  Advance Directive:yes  Today a meeting took place with the Patient.   he  is alert awake oriented, expressed the wish to be DO NOT RESUSCITATE.   The following clinical team members were present during this meeting: Patient, patient's daughter, son-in-law.  The following were discussed:Patient's diagnosis: , Patient's progosis: Unable to determine and Goals for treatment: DNR  Additional follow-up to be provided: yes  Time spent during discussion:20 minutes  Epifanio Lesches, MD

## 2016-04-15 NOTE — Progress Notes (Signed)
Anticoagulation monitoring(Lovenox):  83 yo male ordered Lovenox 30 mg Q24h  Filed Weights   04/15/16 1714  Weight: 194 lb (88 kg)   BMI    Lab Results  Component Value Date   CREATININE 1.45 (H) 04/15/2016   CREATININE 1.31 (H) 02/19/2016   CREATININE 1.41 (H) 02/19/2016   Estimated Creatinine Clearance: 37.8 mL/min (by C-G formula based on SCr of 1.45 mg/dL (H)). Hemoglobin & Hematocrit     Component Value Date/Time   HGB 11.5 (L) 04/15/2016 1823   HGB 8.7 (L) 12/18/2013 0403   HCT 35.1 (L) 04/15/2016 1823   HCT 25.6 (L) 12/17/2013 0435     Per Protocol for Patient with estCrcl > 30 ml/min and BMI < 40, will transition to Lovenox 40 mg Q24h.

## 2016-04-15 NOTE — ED Notes (Signed)
Admitting MD made aware of pt's increased pain and last dosage of dilaudid. Verbal order for 1mg  of dilaudid.

## 2016-04-15 NOTE — Consult Note (Signed)
Right hip displaced femoral neck fracture, plan hemearthroplasty

## 2016-04-15 NOTE — H&P (Signed)
[facility] - [place] at [facility]   PATIENT NAME: [name]    MR#:  [id number]  DATE OF BIRTH:  04-08-1923  DATE OF ADMISSION:  04/15/2016  PRIMARY CARE PHYSICIAN: Brayton El, MD   REQUESTING/REFERRING PHYSICIAN: Sharma Covert  CHIEF COMPLAINT:fall   Chief Complaint  Patient presents with  . Fall    HISTORY OF PRESENT ILLNESS:  [name]  is a 83 y.o. male with a known history of Essential hypertension, generalized anxiety, hyperlipidemia, carotid stenosis, coronary artery disease, history of BPH had a fall this morning secondary to walking on uneven concrete surface, he lost his balance and fell to the right side. Noted to have right hip fracture. Did not have dizziness, chest pain, shortness of breath, lightheadedness. And because of the right hip pain and unable to walk and the came to emergency room. Noted to have abrasions the right middle finger.  PAST MEDICAL HISTORY:   Past Medical History:  Diagnosis Date  . Anxiety   . BPH (benign prostatic hypertrophy)   . Carotid stenosis   . Chronic back pain   . Coronary artery disease   . Dyslipidemia   . GERD (gastroesophageal reflux disease)   . Hx of [name]   . Hyperlipidemia   . Hypertension   . Inguinal hernia   . Lymphoblastic lymphoma (HCC)   . Major depression   . Multiple myeloma (HCC)   . Pleural effusion    left  . Sleep apnea     PAST SURGICAL HISTOIRY:   Past Surgical History:  Procedure Laterality Date  . APPENDECTOMY    . CARDIAC CATHETERIZATION  2002  . CARDIAC CATHETERIZATION  06/2013  . COLONOSCOPY    . HIP FRACTURE SURGERY      SOCIAL HISTORY:   Social History  Substance Use Topics  . Smoking status: Former Smoker    Packs/day: 2.00    Years: 7.00    Types: Cigarettes    Quit date: 09/17/1953  . Smokeless tobacco: Never Used  . Alcohol use No    FAMILY HISTORY:   Family History  Problem Relation Age of Onset  . Heart disease Mother   . Stroke Mother    . Diabetes Father   . Dementia Father   . Stroke Sister   . Stroke Brother   . Stroke Sister   . Stroke Sister   . Stroke Brother   . Stroke Brother     DRUG ALLERGIES:   Allergies  Allergen Reactions  . Sulfamethoxazole-Trimethoprim Nausea Only and Other (See Comments)  . Baclofen   . Nsaids     Avoids due to liver.    REVIEW OF SYSTEMS:  CONSTITUTIONAL: No fever, fatigue or weakness.  EYES: No blurred or double vision.  EARS, NOSE, AND THROAT: No tinnitus or ear pain.  RESPIRATORY: No cough, shortness of breath, wheezing or hemoptysis.  CARDIOVASCULAR: No chest pain, orthopnea, edema.  GASTROINTESTINAL: No nausea, vomiting, diarrhea or abdominal pain.  GENITOURINARY: No dysuria, hematuria.  ENDOCRINE: No polyuria, nocturia,  HEMATOLOGY: No anemia, easy bruising or bleeding SKIN: No rash or lesion. MUSCULOSKELETAL: Right hip  pain NEUROLOGIC: No tingling, numbness, weakness.  PSYCHIATRY: No anxiety or depression.   MEDICATIONS AT HOME:   Prior to Admission medications   Medication Sig Start Date End Date Taking? Authorizing Provider  clonazePAM (KLONOPIN) 0.5 MG tablet Take 1 tablet (0.5 mg total) by mouth 3 (three) times daily as needed for anxiety. 03/23/16  Yes Ellyn Hack,  MD  doxazosin (CARDURA) 1 MG tablet TAKE ONE TABLET BY MOUTH ONCE DAILY 04/08/16  Yes Roselee Nova, MD  isosorbide mononitrate (IMDUR) 30 MG 24 hr tablet Take 1 tablet (30 mg total) by mouth daily. 11/06/15  Yes Roselee Nova, MD  lisinopril (PRINIVIL,ZESTRIL) 2.5 MG tablet Take 1 tablet (2.5 mg total) by mouth daily. 03/10/16  Yes Roselee Nova, MD  metoprolol tartrate (LOPRESSOR) 25 MG tablet Take 1 tablet (25 mg total) by mouth 2 (two) times daily. 02/19/16  Yes Roselee Nova, MD  Multiple Vitamins-Minerals (PRESERVISION/LUTEIN PO) Take by mouth 2 (two) times daily.   Yes Historical Provider, MD  nitroGLYCERIN (NITROSTAT) 0.4 MG SL tablet Place 0.4 mg under the tongue every 5 (five)  minutes as needed for chest pain.   Yes Historical Provider, MD  pantoprazole (PROTONIX) 40 MG tablet Take 1 tablet (40 mg total) by mouth daily. 02/05/16  Yes Roselee Nova, MD  polyethylene glycol (MIRALAX / GLYCOLAX) packet Take 17 g by mouth daily.   Yes Historical Provider, MD  pravastatin (PRAVACHOL) 40 MG tablet TAKE ONE TABLET BY MOUTH ONCE DAILY 04/08/16  Yes Roselee Nova, MD  sertraline (ZOLOFT) 100 MG tablet Take 1.5 tablets (150 mg total) by mouth at bedtime. 01/08/16  Yes Roselee Nova, MD  aspirin EC 81 MG tablet Take 1 tablet (81 mg total) by mouth daily. Patient not taking: Reported on 04/15/2016 07/18/14   Wellington Hampshire, MD  clindamycin (CLEOCIN) 300 MG capsule Take 1 capsule (300 mg total) by mouth 4 (four) times daily. Patient not taking: Reported on 04/15/2016 11/28/14   Roselee Nova, MD  diclofenac sodium (VOLTAREN) 1 % GEL Apply 2 g topically 4 (four) times daily. Patient not taking: Reported on 04/15/2016 02/19/16   Merlyn Lot, MD      VITAL SIGNS:  Blood pressure 123/65, pulse 63, temperature 97.9 F (36.6 C), temperature source Oral, resp. rate 16, height '5\' 10"'$  (1.778 m), weight 88 kg (194 lb), SpO2 95 %.  PHYSICAL EXAMINATION:  GENERAL:  83 y.o.-year-old patient lying in the bed with no acute distress.  EYES: Pupils equal, round, reactive to light and accommodation. No scleral icterus. Extraocular muscles intact.  HEENT: Head atraumatic, normocephalic. Oropharynx and nasopharynx clear.  NECK:  Supple, no jugular venous distention. No thyroid enlargement, no tenderness.  LUNGS: Normal breath sounds bilaterally, no wheezing, rales,rhonchi or crepitation. No use of accessory muscles of respiration.  CARDIOVASCULAR: S1, S2 normal. No murmurs, rubs, or gallops.  ABDOMEN: Soft, nontender, nondistended. Bowel sounds present. No organomegaly or mass.  EXTREMITIES: Noted to have normal range of motion at wrist, elbows, shoulders, ankles, knees, left hip  without pain. Patient has tenderness present in the right greater trochanter area, decreased range of motion in the right hip. Pulses are present in DP, PT bilaterally.  NEUROLOGIC: Cranial nerves II through XII are intact. Muscle strength 5/5 in all extremities. Sensation intact. Gait not checked.  PSYCHIATRIC: The patient is alert and oriented x 3.  SKIN: No obvious rash, lesion, or ulcer.   LABORATORY PANEL:   CBC  Recent Labs Lab 04/15/16 1823  WBC 17.1*  HGB 11.5*  HCT 35.1*  PLT 175   ------------------------------------------------------------------------------------------------------------------  Chemistries   Recent Labs Lab 04/15/16 1823  NA 137  K 4.1  CL 104  CO2 23  GLUCOSE 118*  BUN 31*  CREATININE 1.45*  CALCIUM 9.3   ------------------------------------------------------------------------------------------------------------------  Cardiac  Enzymes No results for input(s): TROPONINI in the last 168 hours. ------------------------------------------------------------------------------------------------------------------  RADIOLOGY:  Dg Chest 1 View  Result Date: 04/15/2016 CLINICAL DATA:  Right hip fracture. EXAM: CHEST 1 VIEW COMPARISON:  Radiographs and CT 02/19/2016, chest radiograph 12/15/2013 FINDINGS: Partially loculated left pleural effusion and left basilar opacity are unchanged from comparison exams. The heart size and mediastinal contours are unchanged, left heart border obscured by effusion and basilar changes. There is atherosclerosis of the thoracic aorta. No pneumothorax. Right lung is clear. No acute rib fractures are seen. IMPRESSION: Stable partially loculated left pleural effusion and basilar opacity, chronic. No acute abnormality. Thoracic aortic atherosclerosis. Electronically Signed   By: Jeb Levering M.D.   On: 04/15/2016 18:40   Dg Hip Unilat W Or Wo Pelvis 2-3 Views Right  Result Date: 04/15/2016 CLINICAL DATA:  Patient fell on  right hip.  Right hip pain. EXAM: DG HIP (WITH OR WITHOUT PELVIS) 2-3V RIGHT COMPARISON:  None. FINDINGS: Bones are diffusely demineralized. Frontal pelvis with AP and frog-leg lateral views of the right hip show a right femoral neck fracture. Assessment is somewhat limited by superimposition of bony anatomy on all projections, but fracture line likely transcervical to subcapital in position. Bony over riding and varus angulation evident. Patient is status post left hip replacement. IMPRESSION: Right femoral neck fracture, likely subcapital to transcervical position although assessment somewhat limited by superimposition of bony anatomy. Electronically Signed   By: Misty [name] M.D.   On: 04/15/2016 18:38    EKG:   Orders placed or performed during the hospital encounter of 04/15/16  . ED EKG  . ED EKG  EKG shows normal sinus rhythm 69 bpm, no ST T changes.  IMPRESSION AND PLAN:  83 year old male patient with multiple medical problems had a fall, noted to have a right femoral neck fracture 1 .right femoral neck fracture: Admitted to hospitalist service, continue pain medicine, DVT prophylaxis, patient is at moderate risk for surgery because of his advanced age, multiple medical problems orthopedic consulted, Dr. Rudene Christians has ben informed  By ER physician. 2 loculated pleural effusion on the left side, elevated white count: Patient has pleural effusion on previous x-rays, has chronic pleural effusion, not hypoxic. But does have a WBC elevation, continue empiric antibiotics. Consult pulmonology.  #3.acute on chronic renal insufficiency with the creatinine bumped up to 1.45, baseline is 1.31: Continue gentle hydration, avoid nephrotoxic agents. Avoid ACE inhibitors for today. Monitor kidney function.  For generalized anxiety: Day Klonopin.  History of coronary artery disease: Patient had no chest pain, no EKG changes. Continue aspirin, metoprolol, Imdur, statins.  History of depression: Continue  Zoloft.  Plan discussed with patient's family and the patient.   All the records are reviewed and case discussed with ED provider. Management plans discussed with the patient, family and they are in agreement.  CODE STATUS:DNR  TOTAL TIME TAKING CARE OF THIS PATIENT: 55 minutes.    Epifanio Lesches M.D on 04/15/2016 at 7:48 PM  Between 7am to 6pm - Pager - [phone]  After 6pm go to [email or web address] - password [email or web address] [place] [facility]  Office  [phone]  CC: Primary care physician; Keith Rake, MD  Note: This dictation was prepared with Dragon dictation along with smaller phrase technology. Any transcriptional errors that result from this process are unintentional.

## 2016-04-16 ENCOUNTER — Inpatient Hospital Stay: Payer: Medicare Other

## 2016-04-16 ENCOUNTER — Encounter
Admission: EM | Disposition: A | Discharge status: Skilled Nursing Facility | Payer: Self-pay | Source: Home / Self Care | Attending: Internal Medicine

## 2016-04-16 ENCOUNTER — Encounter
Admission: RE | Admit: 2016-04-16 | Discharge: 2016-04-16 | Disposition: A | Discharge status: Home / Self Care | Payer: Medicare Other | Source: Ambulatory Visit | Attending: Internal Medicine | Admitting: Internal Medicine

## 2016-04-16 ENCOUNTER — Inpatient Hospital Stay: Payer: Medicare Other | Admitting: Anesthesiology

## 2016-04-16 ENCOUNTER — Encounter: Payer: Self-pay | Admitting: Anesthesiology

## 2016-04-16 HISTORY — PX: ANTERIOR APPROACH HEMI HIP ARTHROPLASTY: SHX6690

## 2016-04-16 LAB — CBC
HCT: 31.2 % — ABNORMAL LOW (ref 40.0–52.0)
Hemoglobin: 10.6 g/dL — ABNORMAL LOW (ref 13.0–18.0)
MCH: 30.4 pg (ref 26.0–34.0)
MCHC: 34 g/dL (ref 32.0–36.0)
MCV: 89.4 fL (ref 80.0–100.0)
Platelets: 147 10*3/uL — ABNORMAL LOW (ref 150–440)
RBC: 3.49 MIL/uL — ABNORMAL LOW (ref 4.40–5.90)
RDW: 14.3 % (ref 11.5–14.5)
WBC: 12.2 10*3/uL — ABNORMAL HIGH (ref 3.8–10.6)

## 2016-04-16 LAB — BASIC METABOLIC PANEL
Anion gap: 7 (ref 5–15)
BUN: 27 mg/dL — ABNORMAL HIGH (ref 6–20)
CO2: 23 mmol/L (ref 22–32)
Calcium: 8.7 mg/dL — ABNORMAL LOW (ref 8.9–10.3)
Chloride: 106 mmol/L (ref 101–111)
Creatinine, Ser: 1.44 mg/dL — ABNORMAL HIGH (ref 0.61–1.24)
GFR calc Af Amer: 48 mL/min — ABNORMAL LOW (ref >60)
GFR calc non Af Amer: 41 mL/min — ABNORMAL LOW (ref >60)
Glucose, Bld: 94 mg/dL (ref 65–99)
Potassium: 4.4 mmol/L (ref 3.5–5.1)
Sodium: 136 mmol/L (ref 135–145)

## 2016-04-16 LAB — GLUCOSE, CAPILLARY: Glucose-Capillary: 92 mg/dL (ref 65–99)

## 2016-04-16 SURGERY — HEMIARTHROPLASTY, HIP, DIRECT ANTERIOR APPROACH, FOR FRACTURE
Anesthesia: Spinal | Laterality: Right

## 2016-04-16 MED ORDER — TRANEXAMIC ACID 1000 MG/10ML IV SOLN
INTRAVENOUS | Status: DC | PRN
  Administered 2016-04-16: 1000 mg via TOPICAL

## 2016-04-16 MED ORDER — ALUM & MAG HYDROXIDE-SIMETH 200-200-20 MG/5ML PO SUSP
30.0000 mL | ORAL | Status: DC | PRN
  Administered 2016-04-16: 30 mL via ORAL
  Filled 2016-04-16: qty 30

## 2016-04-16 MED ORDER — FENTANYL CITRATE (PF) 100 MCG/2ML IJ SOLN: 25.0000 ug | INTRAMUSCULAR | Status: DC | PRN

## 2016-04-16 MED ORDER — CEFAZOLIN SODIUM-DEXTROSE 2-3 GM-% IV SOLR
2.0000 g | Freq: Four times a day (QID) | INTRAVENOUS | Status: AC
  Administered 2016-04-16 – 2016-04-17 (×3): 2 g via INTRAVENOUS
  Filled 2016-04-16 (×3): qty 50

## 2016-04-16 MED ORDER — MIDAZOLAM HCL 5 MG/5ML IJ SOLN
INTRAMUSCULAR | Status: DC | PRN
  Administered 2016-04-16 (×2): 1 mg via INTRAVENOUS

## 2016-04-16 MED ORDER — OXYCODONE HCL 5 MG PO TABS
5.0000 mg | ORAL_TABLET | ORAL | Status: DC | PRN
  Administered 2016-04-16 – 2016-04-18 (×3): 5 mg via ORAL
  Filled 2016-04-16 (×3): qty 1

## 2016-04-16 MED ORDER — PROPOFOL 10 MG/ML IV BOLUS
INTRAVENOUS | Status: DC | PRN
  Administered 2016-04-16 (×3): 20 mg via INTRAVENOUS

## 2016-04-16 MED ORDER — ONDANSETRON HCL 4 MG/2ML IJ SOLN: 4.0000 mg | Freq: Once | INTRAMUSCULAR | Status: DC | PRN

## 2016-04-16 MED ORDER — MORPHINE SULFATE (PF) 2 MG/ML IV SOLN
2.0000 mg | INTRAVENOUS | Status: DC | PRN
  Administered 2016-04-16: 2 mg via INTRAVENOUS
  Filled 2016-04-16: qty 1

## 2016-04-16 MED ORDER — METHOCARBAMOL 500 MG PO TABS: 500.0000 mg | ORAL_TABLET | Freq: Four times a day (QID) | ORAL | Status: DC | PRN

## 2016-04-16 MED ORDER — MAGNESIUM HYDROXIDE 400 MG/5ML PO SUSP: 30.0000 mL | Freq: Every day | ORAL | Status: DC | PRN

## 2016-04-16 MED ORDER — DIPHENHYDRAMINE HCL 12.5 MG/5ML PO ELIX: 12.5000 mg | ORAL_SOLUTION | ORAL | Status: DC | PRN

## 2016-04-16 MED ORDER — BUPIVACAINE-EPINEPHRINE (PF) 0.25% -1:200000 IJ SOLN
INTRAMUSCULAR | Status: AC
  Filled 2016-04-16: qty 30

## 2016-04-16 MED ORDER — NEOMYCIN-POLYMYXIN B GU 40-200000 IR SOLN
Status: DC | PRN
  Administered 2016-04-16: 4 mL

## 2016-04-16 MED ORDER — PROPOFOL 10 MG/ML IV BOLUS
INTRAVENOUS | Status: AC
  Filled 2016-04-16: qty 20

## 2016-04-16 MED ORDER — PROPOFOL 500 MG/50ML IV EMUL
INTRAVENOUS | Status: AC
  Filled 2016-04-16: qty 50

## 2016-04-16 MED ORDER — SODIUM CHLORIDE 0.9 % IV SOLN
INTRAVENOUS | Status: DC
  Administered 2016-04-16 – 2016-04-17 (×3): via INTRAVENOUS

## 2016-04-16 MED ORDER — ZOLPIDEM TARTRATE 5 MG PO TABS: 5.0000 mg | ORAL_TABLET | Freq: Every evening | ORAL | Status: DC | PRN

## 2016-04-16 MED ORDER — PROPOFOL 500 MG/50ML IV EMUL
INTRAVENOUS | Status: DC | PRN
  Administered 2016-04-16: 30 ug/kg/min via INTRAVENOUS

## 2016-04-16 MED ORDER — MAGNESIUM CITRATE PO SOLN
1.0000 | Freq: Once | ORAL | Status: DC | PRN
  Filled 2016-04-16: qty 296

## 2016-04-16 MED ORDER — PHENYLEPHRINE HCL 10 MG/ML IJ SOLN
INTRAMUSCULAR | Status: DC | PRN
  Administered 2016-04-16: 30 ug/min via INTRAVENOUS

## 2016-04-16 MED ORDER — ENOXAPARIN SODIUM 40 MG/0.4ML ~~LOC~~ SOLN
40.0000 mg | SUBCUTANEOUS | Status: DC
  Administered 2016-04-17 – 2016-04-18 (×2): 40 mg via SUBCUTANEOUS
  Filled 2016-04-16 (×2): qty 0.4

## 2016-04-16 MED ORDER — LACTATED RINGERS IV SOLN
INTRAVENOUS | Status: DC
  Administered 2016-04-16: 13:00:00 via INTRAVENOUS

## 2016-04-16 MED ORDER — MENTHOL 3 MG MT LOZG
1.0000 | LOZENGE | OROMUCOSAL | Status: DC | PRN
  Filled 2016-04-16: qty 9

## 2016-04-16 MED ORDER — MIDAZOLAM HCL 2 MG/2ML IJ SOLN
INTRAMUSCULAR | Status: AC
  Filled 2016-04-16: qty 2

## 2016-04-16 MED ORDER — ACETAMINOPHEN 500 MG PO TABS
1000.0000 mg | ORAL_TABLET | Freq: Four times a day (QID) | ORAL | Status: AC
  Administered 2016-04-16 – 2016-04-17 (×3): 1000 mg via ORAL
  Filled 2016-04-16 (×3): qty 2

## 2016-04-16 MED ORDER — CEFAZOLIN SODIUM-DEXTROSE 2-4 GM/100ML-% IV SOLN: 2.0000 g | Freq: Four times a day (QID) | INTRAVENOUS | Status: DC

## 2016-04-16 MED ORDER — DEXTROSE 5 % IV SOLN
500.0000 mg | Freq: Four times a day (QID) | INTRAVENOUS | Status: DC | PRN
  Filled 2016-04-16: qty 5

## 2016-04-16 MED ORDER — NEOMYCIN-POLYMYXIN B GU 40-200000 IR SOLN
Status: AC
  Filled 2016-04-16: qty 4

## 2016-04-16 MED ORDER — PHENOL 1.4 % MT LIQD
1.0000 | OROMUCOSAL | Status: DC | PRN
Start: 2016-04-16 — End: 2016-04-18
  Filled 2016-04-16: qty 177

## 2016-04-16 MED ORDER — BUPIVACAINE IN DEXTROSE 0.75-8.25 % IT SOLN
INTRATHECAL | Status: DC | PRN
  Administered 2016-04-16: 1.6 mL via INTRATHECAL

## 2016-04-16 MED ORDER — TRANEXAMIC ACID 1000 MG/10ML IV SOLN
INTRAVENOUS | Status: AC
  Filled 2016-04-16: qty 10

## 2016-04-16 SURGICAL SUPPLY — 45 items
BLADE SAW SAG 18.5X105 (BLADE) ×2 IMPLANT
BNDG COHESIVE 6X5 TAN STRL LF (GAUZE/BANDAGES/DRESSINGS) ×4 IMPLANT
CANISTER SUCT 1200ML W/VALVE (MISCELLANEOUS) ×2 IMPLANT
CAPT HIP HEMI 2 ×2 IMPLANT
CATH FOL LEG HOLDER (MISCELLANEOUS) ×2 IMPLANT
CATH TRAY METER 16FR LF (MISCELLANEOUS) ×2 IMPLANT
CHLORAPREP W/TINT 26ML (MISCELLANEOUS) ×2 IMPLANT
DRAPE C-ARM XRAY 36X54 (DRAPES) ×2 IMPLANT
DRAPE INCISE IOBAN 66X60 STRL (DRAPES) IMPLANT
DRAPE POUCH INSTRU U-SHP 10X18 (DRAPES) ×2 IMPLANT
DRAPE SHEET LG 3/4 BI-LAMINATE (DRAPES) ×6 IMPLANT
DRAPE TABLE BACK 80X90 (DRAPES) ×2 IMPLANT
DRSG OPSITE POSTOP 4X8 (GAUZE/BANDAGES/DRESSINGS) ×2 IMPLANT
ELECT BLADE 6.5 EXT (BLADE) ×2 IMPLANT
ELECT REM PT RETURN 9FT ADLT (ELECTROSURGICAL) ×2
ELECTRODE REM PT RTRN 9FT ADLT (ELECTROSURGICAL) ×1 IMPLANT
GLOVE BIOGEL PI IND STRL 9 (GLOVE) ×1 IMPLANT
GLOVE BIOGEL PI INDICATOR 9 (GLOVE) ×1
GLOVE SURG SYN 9.0  PF PI (GLOVE) ×2
GLOVE SURG SYN 9.0 PF PI (GLOVE) ×2 IMPLANT
GOWN SRG 2XL LVL 4 RGLN SLV (GOWNS) ×1 IMPLANT
GOWN STRL NON-REIN 2XL LVL4 (GOWNS) ×1
GOWN STRL REUS W/ TWL LRG LVL3 (GOWN DISPOSABLE) ×1 IMPLANT
GOWN STRL REUS W/TWL LRG LVL3 (GOWN DISPOSABLE) ×1
HEMOVAC 400CC 10FR (MISCELLANEOUS) IMPLANT
HOOD PEEL AWAY FLYTE STAYCOOL (MISCELLANEOUS) ×2 IMPLANT
MAT BLUE FLOOR 46X72 FLO (MISCELLANEOUS) ×2 IMPLANT
NDL SAFETY 18GX1.5 (NEEDLE) ×2 IMPLANT
NEEDLE SPNL 18GX3.5 QUINCKE PK (NEEDLE) ×2 IMPLANT
NS IRRIG 1000ML POUR BTL (IV SOLUTION) ×2 IMPLANT
PACK HIP COMPR (MISCELLANEOUS) ×2 IMPLANT
SOL PREP PVP 2OZ (MISCELLANEOUS) ×2
SOLUTION PREP PVP 2OZ (MISCELLANEOUS) ×1 IMPLANT
SPONGE DRAIN TRACH 4X4 STRL 2S (GAUZE/BANDAGES/DRESSINGS) ×2 IMPLANT
STAPLER SKIN PROX 35W (STAPLE) ×2 IMPLANT
STRAP SAFETY BODY (MISCELLANEOUS) ×2 IMPLANT
SUT DVC 2 QUILL PDO  T11 36X36 (SUTURE) ×1
SUT DVC 2 QUILL PDO T11 36X36 (SUTURE) ×1 IMPLANT
SUT SILK 0 (SUTURE) ×1
SUT SILK 0 30XBRD TIE 6 (SUTURE) ×1 IMPLANT
SUT V-LOC 90 ABS DVC 3-0 CL (SUTURE) ×2 IMPLANT
SUT VIC AB 1 CT1 36 (SUTURE) ×2 IMPLANT
SYR 20CC LL (SYRINGE) ×2 IMPLANT
SYR 30ML LL (SYRINGE) ×2 IMPLANT
TOWEL OR 17X26 4PK STRL BLUE (TOWEL DISPOSABLE) ×2 IMPLANT

## 2016-04-16 NOTE — Clinical Social Work Note (Signed)
Clinical Social Work Assessment  Patient Details  Name: [name] MRN: [id number] Date of Birth: [birth year]  Date of referral:  04/16/16               Reason for consult:  Facility Placement, Discharge Planning                Permission sought to share information with:  Chartered certified accountant granted to share information::  Yes, Verbal Permission Granted  Name::      [place]::   [place]   Relationship::     Contact Information:     Housing/Transportation Living arrangements for the past 2 months:  [facility]) Source of Information:  Patient, Spouse Patient Interpreter Needed:  None Criminal Activity/Legal Involvement Pertinent to Current Situation/Hospitalization:  No - Comment as needed Significant Relationships:  Spouse, Adult Children Lives with:  Other (Comment) [facility]) Do you feel safe going back to the place where you live?  Yes Need for family participation in patient care:  No (Coment)  Care giving concerns:  Patient lives at [facility].    Social Worker assessment / plan:  Social work Theatre manager received consult. PT has not worked with patient at this time. Patient is having surgery today. Social work Theatre manager met with patient at bedside with his wife [name]. Patient was alert and oriented x4 and was laying in bed. Social work Theatre manager introduced herself and explained the role of social work department. Per patient, he lives at [name] with his wife. Patient and wife have lived there for 3 years. Patient has two children. Patient's wife is patients HPOA. Per patient and  patients wife, they preferring patient to go home after surgery and receive home health. Patients wife explained that patient has been to [facility] and [facility] for previous surgeries for rehab. Patient and patient's wife want what is best for the patient's health and safety.  Patient's wife and patient are agreeable to SNF search in [place]. Social work Theatre manager explained that PT will work with patient after surgery and determine in patient needs home health or SNF placement. Patient and patient's wife verbally agreed they understood. Social work Theatre manager explained that with patient having Medicare, patient will need a three-night qualifying inpatient stay in order for Medicare to pay for rehab. Patient was inpatient 02/28. Patient and patient's wife verbally agreed they understood. Social work Theatre manager will continue to assist and follow as needed.   Employment status:  Unemployed Forensic scientist:  Medicare PT Recommendations:  Not assessed at this time Information / Referral to community resources:  [place]  Patient/Family's Response to care:  Patient and patient's wife are agreeable to SNF search in [facility]   Patient/Family's Understanding of and Emotional Response to Diagnosis, Current Treatment, and Prognosis:  Patient and patient's wife was pleasant and thanked social work Theatre manager for visiting.   Emotional Assessment Appearance:  Appears stated age Attitude/Demeanor/Rapport:    Affect (typically observed):  Accepting, Adaptable, Appropriate Orientation:  Oriented to Self, Oriented to Place, Oriented to  Time, Oriented to Situation Alcohol / Substance use:  Not Applicable Psych involvement (Current and /or in the community):  No (Comment)  Discharge Needs  Concerns to be addressed:  Discharge Planning Concerns, Basic Needs Readmission within the last 30 days:  No Current discharge risk:  Dependent with Mobility Barriers to Discharge:  Continued Medical Work up   [employer],  Student-Social Work 04/16/2016, 10:05 AM

## 2016-04-16 NOTE — Care Management Note (Signed)
Case Management Note  Patient Details  Name: [name] MRN: [id number] Date of Birth: 06/22/1923  Subjective/Objective:                   Met with patient and his wife prior to surgery.He is a WW[employer] veteran but denies affiliation with [employer].  Patient is from [facility] independent living where he was ambulatory with a rollar and sometimes cane. He does not own a front-wheeled walker. He has decline [facility] PT at [facility] and wishes to use [name]. He has been to outpatient PT at [facility] in the past and was very pleased with it. He would like to return home at discharge with [name]- I expect he will need a walker if he goes home. He uses [facility] [phone] for medications. Action/Plan: Home health list delivered to patient. Referral to [name]. RNCM to follow for home health, DME, and Lovenox per Dr. Menz request.    Expected Discharge Date:  04/17/16               Expected Discharge Plan:     In-House Referral:     Discharge planning Services  CM Consult  Post Acute Care Choice:  Durable Medical Equipment, Home Health Choice offered to:  Patient, Spouse  DME Arranged:  Walker rolling DME Agency:  [facility]  HH Arranged:  PT HH Agency:  [facility] (now [name])  Status of Service:  In process, will continue to follow  If discussed at Long Length of Stay Meetings, dates discussed:    Additional Comments:  Angela [name], RN 04/16/2016, 1:38 PM  

## 2016-04-16 NOTE — Anesthesia Procedure Notes (Signed)
Spinal  Patient location during procedure: OR Staffing Anesthesiologist: Gunnar Bulla Performed: anesthesiologist  Preanesthetic Checklist Completed: patient identified, site marked, surgical consent, pre-op evaluation, timeout performed, IV checked and risks and benefits discussed Spinal Block Patient position: sitting Prep: Betadine Patient monitoring: heart rate, cardiac monitor, continuous pulse ox and blood pressure Approach: midline Location: L3-4 Injection technique: single-shot Needle Needle type: Pencil-Tip  Needle gauge: 25 G Needle length: 9 cm Assessment Sensory level: T10 Additional Notes 1453 marcaine .75% 1.62ml.

## 2016-04-16 NOTE — Clinical Social Work Placement (Signed)
   CLINICAL SOCIAL WORK PLACEMENT  NOTE  Date:  04/16/2016  Patient Details  Name: [name] MRN: [id number] Date of Birth: [birth year]  Clinical Social Work is seeking post-discharge placement for this patient at the [place] level of care (*CSW will initial, date and re-position this form in  chart as items are completed):  Yes   Patient/family provided with [place] Work Department's list of facilities offering this level of care within the geographic area requested by the patient (or if unable, by the patient's family).  Yes   Patient/family informed of their freedom to choose among providers that offer the needed level of care, that participate in Medicare, Medicaid or managed care program needed by the patient, have an available bed and are willing to accept the patient.  Yes   Patient/family informed of [place]'s ownership interest in [facility] and [facility], as well as of the fact that they are under no obligation to receive care at these facilities.  PASRR submitted to EDS on       PASRR number received on       Existing PASRR number confirmed on 04/16/16     FL2 transmitted to all facilities in geographic area requested by pt/family on 04/16/16     FL2 transmitted to all facilities within larger geographic area on       Patient informed that his/her managed care company has contracts with or will negotiate with certain facilities, including the following:            Patient/family informed of bed offers received.  Patient chooses bed at       Physician recommends and patient chooses bed at      Patient to be transferred to   on  .  Patient to be transferred to facility by       Patient family notified on   of transfer.  Name of family member notified:        PHYSICIAN       Additional Comment:    _______________________________________________ Danie Chandler, [place] Work 04/16/2016, 10:16 AM

## 2016-04-16 NOTE — Progress Notes (Signed)
Patient and patient's wife accepted bed offer at [facility]. Sharyn Lull, admissions coordinator at [facility] is aware of accepted bed offer. Social work Theatre manager will continue to assist and follow as needed.   Danie Chandler, Social Work Intern  [phone]

## 2016-04-16 NOTE — Transfer of Care (Signed)
Immediate Anesthesia Transfer of Care Note  Patient: [name]  Procedure(s) Performed: Procedure(s): ANTERIOR APPROACH HEMI HIP ARTHROPLASTY (Right)  Patient Location: PACU  Anesthesia Type:Spinal  Level of Consciousness: awake and alert   Airway & Oxygen Therapy: Patient Spontanous Breathing and Patient connected to face mask oxygen  Post-op Assessment: Report given to RN and Post -op Vital signs reviewed and stable  Post vital signs: Reviewed and stable  Last Vitals:  Vitals:   04/16/16 0731 04/16/16 1238  BP: 136/60 129/60  Pulse: 72 75  Resp: 18 18  Temp:  36.9 C    Last Pain:  Vitals:   04/16/16 1238  TempSrc: Oral  PainSc:          Complications: No apparent anesthesia complications

## 2016-04-16 NOTE — Progress Notes (Signed)
Shift assessment completed. Pt is awake, alert and oriented, o2 on at [facility], pt stating to this writer that he has flyuid in his L lung, lungs are clear but decreased to L base. Hr is regular, abdomen is soft, bs heard. Pt is voiding in urinal prn,ppp, teds and foot pumps in place. PIV #20 intact to R arm, iv ns infusing per md order. Pt rating pain at 5/10 at this time. Wife at bedside. Pt has bandage to R third finger that has some blood showing, pt is picking at the bandage, requested that it be changed. Dr. Rudene Christians has been in to discuss surgery with pt. Call bell in reach.

## 2016-04-16 NOTE — Progress Notes (Signed)
[place] [facility]                                                                                                                                                                                  Patient Demographics   [place], is a 83 y.o. male, DOB - 10-11-1923, [id number]  Admit date - 04/15/2016   Admitting Physician Epifanio Lesches, MD  Outpatient Primary MD for the patient is Keith Rake, MD   LOS - 1  Subjective: Patient admitted with fall. And has a hip fracture. This morning he is denying any chest pain or shortness of breath awaiting surgery Has pain in his hip    Review of Systems:   CONSTITUTIONAL: No documented fever. No fatigue, weakness. No weight gain, no weight loss.  EYES: No blurry or double vision.  [name]: No tinnitus. No postnasal drip. No redness of the oropharynx.  RESPIRATORY: No cough, no wheeze, no hemoptysis. No dyspnea.  CARDIOVASCULAR: No chest pain. No orthopnea. No palpitations. No syncope.  GASTROINTESTINAL: No nausea, no vomiting or diarrhea. No abdominal pain. No melena or hematochezia.  GENITOURINARY: No dysuria or hematuria.  ENDOCRINE: No polyuria or nocturia. No heat or cold intolerance.  HEMATOLOGY: No anemia. No bruising. No bleeding.  INTEGUMENTARY: No rashes. No lesions.  MUSCULOSKELETAL: No arthritis. No swelling. No gout. Positive hip pain NEUROLOGIC: No numbness, tingling, or ataxia. No seizure-type activity.  PSYCHIATRIC: No anxiety. No insomnia. No ADD.    Vitals:   Vitals:   04/15/16 2030 04/15/16 2115 04/16/16 0410 04/16/16 0731  BP: 111/65 (!) 154/70 (!) 122/53 136/60  Pulse: 81 90 72 72  Resp: (!) 25 19 20 18   Temp:  98.4 F (36.9 C) 98.4 F (36.9 C)   TempSrc:  Oral Oral   SpO2: 91% 92% 96% 99%  Weight:  191 lb 6.4 oz (86.8 kg)    Height:  5\' 10"  (1.778 m)      Wt Readings from Last 3 Encounters:  04/15/16 191 lb 6.4 oz (86.8 kg)  03/23/16 193 lb 6 oz (87.7 kg)  02/19/16 198  lb (89.8 kg)     Intake/Output Summary (Last 24 hours) at 04/16/16 1154 Last data filed at 04/16/16 [id number]  Gross per 24 hour  Intake           686.25 ml  Output              500 ml  Net           186.25 ml    Physical Exam:   GENERAL: Pleasant-appearing in no apparent distress.  HEAD, EYES, EARS, NOSE AND THROAT: Atraumatic, normocephalic. Extraocular muscles are  intact. Pupils equal and reactive to light. Sclerae anicteric. No conjunctival injection. No oro-pharyngeal erythema.  NECK: Supple. There is no jugular venous distention. No bruits, no lymphadenopathy, no thyromegaly.  HEART: Regular rate and rhythm,. No murmurs, no rubs, no clicks.  LUNGS: Occasional rhonchus breath sounds no sensory muscle usage no wheezing  ABDOMEN: Soft, flat, nontender, nondistended. Has good bowel sounds. No hepatosplenomegaly appreciated.  EXTREMITIES: No evidence of any cyanosis, clubbing, or peripheral edema.  +2 pedal and radial pulses bilaterally.  NEUROLOGIC: The patient is alert, awake, and oriented x3 with no focal motor or sensory deficits appreciated bilaterally.  SKIN: Moist and warm with no rashes appreciated.  Psych: Not anxious, depressed LN: No inguinal LN enlargement    Antibiotics   Anti-infectives    Start     Dose/Rate Route Frequency Ordered Stop   04/16/16 1300  ceFAZolin (ANCEF) IVPB 2g/100 mL premix  Status:  Discontinued     2 g 200 mL/hr over 30 Minutes Intravenous  Once 04/15/16 2321 04/15/16 2327   04/15/16 2330  ceFAZolin (ANCEF) IVPB 2 g/50 mL premix     2 g 100 mL/hr over 30 Minutes Intravenous  Once 04/15/16 2327     04/15/16 2200  clindamycin (CLEOCIN) capsule 300 mg  Status:  Discontinued     300 mg Oral 4 times daily 04/15/16 1946 04/15/16 1953   04/15/16 2000  levofloxacin (LEVAQUIN) IVPB 750 mg     750 mg 100 mL/hr over 90 Minutes Intravenous Every 48 hours 04/15/16 1955        Medications   Scheduled Meds: . ceFAZolin  2 g Intravenous Once  .  docusate sodium  100 mg Oral BID  . doxazosin  1 mg Oral Daily  . isosorbide mononitrate  30 mg Oral Daily  . levofloxacin (LEVAQUIN) IV  750 mg Intravenous Q48H  . metoprolol tartrate  25 mg Oral BID  . pantoprazole  40 mg Oral Daily  . polyethylene glycol  17 g Oral Daily  . pravastatin  40 mg Oral Daily  . sertraline  150 mg Oral QHS   Continuous Infusions: . sodium chloride 75 mL/hr at 04/15/16 2137   PRN Meds:.acetaminophen **OR** acetaminophen, bisacodyl, clonazePAM, HYDROmorphone (DILAUDID) injection, nitroGLYCERIN, ondansetron **OR** ondansetron (ZOFRAN) IV, traZODone   Data Review:   Micro Results Recent Results (from the past 240 hour(s))  MRSA PCR Screening     Status: None   Collection Time: 04/15/16  9:28 PM  Result Value Ref Range Status   MRSA by PCR NEGATIVE NEGATIVE Final    Comment:        The GeneXpert MRSA Assay (FDA approved for NASAL specimens only), is one component of a comprehensive MRSA colonization surveillance program. It is not intended to diagnose MRSA infection nor to guide or monitor treatment for MRSA infections.     Radiology Reports Dg Chest 1 View  Result Date: 04/15/2016 CLINICAL DATA:  Right hip fracture. EXAM: CHEST 1 VIEW COMPARISON:  Radiographs and CT 02/19/2016, chest radiograph 12/15/2013 FINDINGS: Partially loculated left pleural effusion and left basilar opacity are unchanged from comparison exams. The heart size and mediastinal contours are unchanged, left heart border obscured by effusion and basilar changes. There is atherosclerosis of the thoracic aorta. No pneumothorax. Right lung is clear. No acute rib fractures are seen. IMPRESSION: Stable partially loculated left pleural effusion and basilar opacity, chronic. No acute abnormality. Thoracic aortic atherosclerosis. Electronically Signed   By: Jeb Levering M.D.   On: 04/15/2016 18:40  Dg Hip Unilat W Or Wo Pelvis 2-3 Views Right  Result Date: 04/15/2016 CLINICAL DATA:   Patient fell on right hip.  Right hip pain. EXAM: DG HIP (WITH OR WITHOUT PELVIS) 2-3V RIGHT COMPARISON:  None. FINDINGS: Bones are diffusely demineralized. Frontal pelvis with AP and frog-leg lateral views of the right hip show a right femoral neck fracture. Assessment is somewhat limited by superimposition of bony anatomy on all projections, but fracture line likely transcervical to subcapital in position. Bony over riding and varus angulation evident. Patient is status post left hip replacement. IMPRESSION: Right femoral neck fracture, likely subcapital to transcervical position although assessment somewhat limited by superimposition of bony anatomy. Electronically Signed   By: Misty [name] M.D.   On: 04/15/2016 18:38     CBC  Recent Labs Lab 04/15/16 1823 04/16/16 0330  WBC 17.1* 12.2*  HGB 11.5* 10.6*  HCT 35.1* 31.2*  PLT 175 147*  MCV 90.4 89.4  MCH 29.6 30.4  MCHC 32.8 34.0  RDW 14.5 14.3    Chemistries   Recent Labs Lab 04/15/16 1823 04/16/16 0330  NA 137 136  K 4.1 4.4  CL 104 106  CO2 23 23  GLUCOSE 118* 94  BUN 31* 27*  CREATININE 1.45* 1.44*  CALCIUM 9.3 8.7*   ------------------------------------------------------------------------------------------------------------------ estimated creatinine clearance is 35.2 mL/min (by C-G formula based on SCr of 1.44 mg/dL (H)). ------------------------------------------------------------------------------------------------------------------ No results for input(s): HGBA1C in the last 72 hours. ------------------------------------------------------------------------------------------------------------------ No results for input(s): CHOL, HDL, LDLCALC, TRIG, CHOLHDL, LDLDIRECT in the last 72 hours. ------------------------------------------------------------------------------------------------------------------ No results for input(s): TSH, T4TOTAL, T3FREE, THYROIDAB in the last 72 hours.  Invalid input(s):  FREET3 ------------------------------------------------------------------------------------------------------------------ No results for input(s): VITAMINB12, FOLATE, FERRITIN, TIBC, IRON, RETICCTPCT in the last 72 hours.  Coagulation profile  Recent Labs Lab 04/15/16 1823  INR 1.01    No results for input(s): DDIMER in the last 72 hours.  Cardiac Enzymes No results for input(s): CKMB, TROPONINI, MYOGLOBIN in the last 168 hours.  Invalid input(s): CK ------------------------------------------------------------------------------------------------------------------ Invalid input(s): POCBNP    Assessment & Plan   83 year old male patient with multiple medical problems had a fall, noted to have a right femoral neck fracture 1 .right femoral neck fracture:At moderate risk for surgical complications no further cardiopulmonary workup needed   2 loculated pleural effusion on the left side, appears to be chronic similar type of x-ray findings and CT scan findings last year. No further workup needed I suspect the elevated WBC count was due to stress reaction is trending down  3.Marland Kitchenacute on chronic renal insufficiency with the creatinine bumped up to 1.45, baseline is 1.31: Continue gentle hydration, avoid nephrotoxic agents. Avoid ACE inhibitors  4. For generalized anxiety:  Continue Klonopin.  5. coronary artery disease: continue metoprolol, Imdur, statins.  6. History of depression: Continue Zoloft.       Code Status Orders        Start     Ordered   04/15/16 1945  Full code  Continuous     04/15/16 1946    Code Status History    Date Active Date Inactive Code Status Order ID Comments User Context   This patient has a current code status but no historical code status.    Advance Directive Documentation   Flowsheet Row Most Recent Value  Type of Advance Directive  Healthcare Power of Attorney, Living will  Pre-existing out of facility DNR order (yellow form or pink  MOST form)  No data  "MOST" Form in Place?  No data           Consults  Orthopedics   DVT Prophylaxis recommend Lovenox.  Lab Results  Component Value Date   PLT 147 (L) 04/16/2016     Time Spent in minutes  60min  Greater than 50% of time spent in care coordination and counseling patient regarding the condition and plan of care.   Dustin Flock M.D on 04/16/2016 at 11:54 AM  Between 7am to 6pm - Pager - [phone]  After 6pm go to [email or web address] - password [email or web address] [facility]   Office  [phone]

## 2016-04-16 NOTE — Anesthesia Post-op Follow-up Note (Cosign Needed)
Anesthesia QCDR form completed.        

## 2016-04-16 NOTE — Progress Notes (Signed)
Pt returned from PACU at approx 0535. Pt awake, alert and oriented to all but time, rating pain at 3/10. Pt is on o2 at [facility], this is changed to [facility]. Pt shivering on arrival, received morphine ivp for this. Lungs are clear, Hr is regular, abdomen is soft, bs heard. Pt had spinal but is able to bend his l leg and is able to wiggle toes on both feet. Foley intact with leg strap, R hip has honeycomb dressing and hemovac intact. Ppp, teds and foot pumps in place. Pt remains with dressing on his r third finger from fall prior to admission, has abrasion to R scapula covered with foam dressing, and sacral patch is intact. piv #20 intact to l arm, Ns hung per md order. Pt and family educated about clear liquid diet, and physical therapy to start in the morning. Pt was offered clear liquids and refused, these were left at bedside. Pt with call bell in reach.

## 2016-04-16 NOTE — NC FL2 (Signed)
[place] LEVEL OF CARE SCREENING TOOL     IDENTIFICATION  Patient Name: [name] Birthdate: 12-19-1923 Sex: male Admission Date (Current Location): 04/15/2016  [place] and [place] Number:  Engineering geologist and Address:  [facility], [address] [place], [place] [address]      Provider Number: [id number]  Attending Physician Name and Address:  Dustin Flock, MD  Relative Name and Phone Number:       Current Level of Care: Hospital Recommended Level of Care: [place] Prior Approval Number:    Date Approved/Denied:   PASRR Number:  ([id number])  Discharge Plan: SNF    Current Diagnoses: Patient Active Problem List   Diagnosis Date Noted  . Closed right hip fracture, initial encounter ([place]) 04/15/2016  . Productive cough 03/23/2016  . Medicare annual wellness visit, subsequent 02/19/2016  . Major depression 10/14/2015  . Behavior disturbance 09/12/2015  . Mobility impaired 08/27/2015  . Skin ulcer of back ([place]) 07/01/2015  . Laceration of right hand 07/01/2015  . Traumatic hematoma of lower back 07/01/2015  . Encounters for administrative purpose 04/01/2015  . Skin lesion of face 12/24/2014  . Contusion of right hand 11/28/2014  . Herpes zoster 10/30/2014  . Need for immunization against influenza 10/24/2014  . Compression fracture of L4 lumbar vertebra (HCC) 09/28/2014  . Rectal or anal pain 08/13/2014  . Anxiety disorder 07/23/2014  . BPH without obstruction/lower urinary tract symptoms 07/23/2014  . Atherosclerosis of coronary artery 07/23/2014  . Chronic LBP 07/23/2014  . Hypercholesteremia 07/23/2014  . Benign hypertension 07/23/2014  . Acid reflux 07/23/2014  . Chronic recurrent major depressive disorder ([place]) 07/23/2014  . GERD (gastroesophageal reflux disease) 07/23/2014  . Coronary artery disease involving native coronary artery without angina pectoris 07/06/2014  . Essential  hypertension 07/06/2014  . Carotid stenosis 07/06/2014  . Hyperlipidemia   . H/O [name] 12/20/2011    Orientation RESPIRATION BLADDER Height & Weight     Self, Time, Situation, Place  O2 (Nasal Cannula 2L/min) Continent Weight: 191 lb 6.4 oz (86.8 kg) Height:  5\' 10"  (177.8 cm)  BEHAVIORAL SYMPTOMS/MOOD NEUROLOGICAL BOWEL NUTRITION STATUS   (None. )  (None. ) Continent Diet (Diet: NPO due to surgery )  AMBULATORY STATUS COMMUNICATION OF NEEDS Skin   Extensive Assist Verbally Normal                       Personal Care Assistance Level of Assistance  Bathing, Feeding, Dressing Bathing Assistance: Limited assistance Feeding assistance: Independent Dressing Assistance: Limited assistance     Functional Limitations Info  Sight, Hearing, Speech Sight Info: Adequate Hearing Info: Impaired Speech Info: Adequate    SPECIAL CARE FACTORS FREQUENCY  PT (By licensed PT), OT (By licensed OT)     PT Frequency:  (5) OT Frequency:  (5)            Contractures      Additional Factors Info  Code Status, Allergies Code Status Info:  (Full Code) Allergies Info:  (Sulfamethoxazole-trimethoprim, Baclofen, Nsaids)           Current Medications (04/16/2016):  This is the current hospital active medication list Current Facility-Administered Medications  Medication Dose Route Frequency Provider Last Rate Last Dose  . 0.9 %  sodium chloride infusion   Intravenous Continuous Epifanio Lesches, MD 75 mL/hr at 04/15/16 2137    . acetaminophen (TYLENOL) tablet 650 mg  650 mg Oral Q6H PRN Epifanio Lesches, MD  Or  . acetaminophen (TYLENOL) suppository 650 mg  650 mg Rectal Q6H PRN Epifanio Lesches, MD      . bisacodyl (DULCOLAX) EC tablet 5 mg  5 mg Oral Daily PRN Epifanio Lesches, MD      . ceFAZolin (ANCEF) IVPB 2 g/50 mL premix  2 g Intravenous Once Epifanio Lesches, MD      . clonazePAM Bobbye Charleston) tablet 0.5 mg  0.5 mg Oral TID PRN Epifanio Lesches, MD    0.5 mg at 04/16/16 0107  . docusate sodium (COLACE) capsule 100 mg  100 mg Oral BID Epifanio Lesches, MD   100 mg at 04/15/16 2151  . doxazosin (CARDURA) tablet 1 mg  1 mg Oral Daily Epifanio Lesches, MD   1 mg at 04/16/16 0936  . HYDROmorphone (DILAUDID) injection 2 mg  2 mg Intravenous Q4H PRN Epifanio Lesches, MD   2 mg at 04/16/16 0935  . isosorbide mononitrate (IMDUR) 24 hr tablet 30 mg  30 mg Oral Daily Epifanio Lesches, MD   30 mg at 04/16/16 0936  . levofloxacin (LEVAQUIN) IVPB 750 mg  750 mg Intravenous Q48H Sheema M Hallaji, RPH   750 mg at 04/15/16 2256  . metoprolol tartrate (LOPRESSOR) tablet 25 mg  25 mg Oral BID Epifanio Lesches, MD   25 mg at 04/15/16 2152  . nitroGLYCERIN (NITROSTAT) SL tablet 0.4 mg  0.4 mg Sublingual Q5 min PRN Epifanio Lesches, MD      . ondansetron (ZOFRAN) tablet 4 mg  4 mg Oral Q6H PRN Epifanio Lesches, MD       Or  . ondansetron (ZOFRAN) injection 4 mg  4 mg Intravenous Q6H PRN Epifanio Lesches, MD      . pantoprazole (PROTONIX) EC tablet 40 mg  40 mg Oral Daily Epifanio Lesches, MD      . polyethylene glycol (MIRALAX / GLYCOLAX) packet 17 g  17 g Oral Daily Epifanio Lesches, MD   17 g at 04/15/16 2149  . pravastatin (PRAVACHOL) tablet 40 mg  40 mg Oral Daily Epifanio Lesches, MD   40 mg at 04/15/16 2151  . sertraline (ZOLOFT) tablet 150 mg  150 mg Oral QHS Epifanio Lesches, MD   150 mg at 04/15/16 2151  . traZODone (DESYREL) tablet 25 mg  25 mg Oral QHS PRN Epifanio Lesches, MD         Discharge Medications: Please see discharge summary for a list of discharge medications.  Relevant Imaging Results:  Relevant Lab Results:   Additional Information  (SSN: [id number])  Danie Chandler, Student-Social Work

## 2016-04-16 NOTE — Anesthesia Procedure Notes (Signed)
Date/Time: 04/16/2016 3:19 PM Performed by: Nelda Marseille Pre-anesthesia Checklist: Patient identified, Emergency Drugs available, Patient being monitored, Suction available and Timeout performed Oxygen Delivery Method: Simple face mask

## 2016-04-16 NOTE — Op Note (Signed)
04/15/2016 - 04/16/2016  4:42 PM  PATIENT:  [name]  83 y.o. male  PRE-OPERATIVE DIAGNOSIS:  Right hip fracture, femoral neck  POST-OPERATIVE DIAGNOSIS:  Right hip fracture  PROCEDURE:  Procedure(s): ANTERIOR APPROACH HEMI HIP ARTHROPLASTY (Right)  SURGEON: Laurene Footman, MD  ASSISTANTS: None  ANESTHESIA:   spinal  EBL:  Total I/O In: 600 [I.V.:600] Out: 1300 [Urine:600; Blood:700]  BLOOD ADMINISTERED:none  DRAINS: (2) Hemovact drain(s) in the Subcutaneous layer with  Suction Open   LOCAL MEDICATIONS USED:  MARCAINE   TXA  SPECIMEN:  Source of Specimen:  Femoral head  DISPOSITION OF SPECIMEN:  PATHOLOGY  COUNTS:  YES  TOURNIQUET:  * No tourniquets in log *  IMPLANTS: Medacta AMIS 6 standard stem with M 28 mm head and 55 bipolar head  DICTATION: .Dragon Dictation   The patient was brought to the operating room and after spinal anesthesia was obtained patient was placed on the operative table with the ipsilateral foot into the Medacta attachment, contralateral leg on a well-padded table. C-arm was brought in and preop template x-ray taken. After prepping and draping in usual sterile fashion appropriate patient identification and timeout procedures were completed. Anterior approach to the hip was obtained and centered over the greater trochanter and TFL muscle. The subcutaneous tissue was incised hemostasis being achieved by electrocautery. TFL fascia was incised and the muscle retracted laterally deep retractor placed. The lateral femoral circumflex vessels were identified and ligated. The anterior capsule was exposed and a capsulotomy performed. The neck was identified and a femoral neck cut carried out with a saw. We will the femoral neck fracture. The head was removed without difficulty and showed essentially normal femoral head and acetabulum. 55 mm trial fit well in the acetabulum. Ischiofemoral and patellofemoral releases carried out and the leg was externally rotated  and dropped into extension. The femur was sequentially broached to a size 6,  the final components chosen. The 6 standard stem was inserted along with a M 28 mm head and 55 mm mm bipolar head. The hip was reduced and was stable the wound was thoroughly irrigated with a dilute Betadine solution. The deep fascia was closed using a heavy Quill after infiltration of 30 cc of quarter percent Sensorcaine with epinephrine. Subcutaneous drains were then inserted. TXA was injected into the joint at this point and the skin closed with 3-0 V-loc suture followed by skin staples. Xeroform and honeycomb dressing applied  PLAN OF CARE: Continue as inpatient

## 2016-04-16 NOTE — Anesthesia Preprocedure Evaluation (Signed)
Anesthesia Evaluation  Patient identified by MRN, date of birth, ID band Patient awake    Reviewed: Allergy & Precautions, NPO status , Patient's Chart, lab work & pertinent test results, reviewed documented beta blocker date and time   Airway Mallampati: II  TM Distance: >3 FB     Dental  (+) Chipped   Pulmonary sleep apnea , former smoker,           Cardiovascular hypertension, Pt. on medications + [name] and + Peripheral Vascular Disease       Neuro/Psych PSYCHIATRIC DISORDERS Anxiety Depression    GI/Hepatic GERD  Controlled,  Endo/Other    Renal/GU      Musculoskeletal   Abdominal   Peds  Hematology   Anesthesia Other Findings L4 compression fx.Lymphoma. Multiple myeloma.  Reproductive/Obstetrics                             Anesthesia Physical Anesthesia Plan  ASA: III  Anesthesia Plan: Spinal   Post-op Pain Management:    Induction:   Airway Management Planned:   Additional Equipment:   Intra-op Plan:   Post-operative Plan:   Informed Consent: I have reviewed the patients History and Physical, chart, labs and discussed the procedure including the risks, benefits and alternatives for the proposed anesthesia with the patient or authorized representative who has indicated his/her understanding and acceptance.     Plan Discussed with: CRNA  Anesthesia Plan Comments:         Anesthesia Quick Evaluation

## 2016-04-16 NOTE — Progress Notes (Signed)
I have reviewed CT scans and CXR which shows Left lung opacity and effusion since CXR 2015 This is a chronic effusion that can be worked up as outpatient. I dicussed case with Dr. Dustin Flock and we agree that this can be followed as outpatient.   Patient admitted for hip fracture and will undergo surgery. Patient has no acute resp issues at this time.   I Advised to call us with any further questions.   Corrin Parker, M.D.  [facility]  Medical Director [place] Director [facility] Cardio-Pulmonary Department

## 2016-04-17 ENCOUNTER — Encounter: Payer: Self-pay | Admitting: Orthopedic Surgery

## 2016-04-17 ENCOUNTER — Ambulatory Visit: Payer: Medicare Other | Admitting: Cardiovascular Disease

## 2016-04-17 LAB — DIFFERENTIAL
Basophils Absolute: 0 10*3/uL (ref 0–0.1)
Basophils Relative: 0 %
Eosinophils Absolute: 0.3 10*3/uL (ref 0–0.7)
Eosinophils Relative: 2 %
Lymphocytes Relative: 6 %
Lymphs Abs: 0.7 10*3/uL — ABNORMAL LOW (ref 1.0–3.6)
Monocytes Absolute: 1 10*3/uL (ref 0.2–1.0)
Monocytes Relative: 8 %
Neutro Abs: 10.5 10*3/uL — ABNORMAL HIGH (ref 1.4–6.5)
Neutrophils Relative %: 84 %

## 2016-04-17 LAB — CBC
HCT: 24.3 % — ABNORMAL LOW (ref 40.0–52.0)
Hemoglobin: 8.3 g/dL — ABNORMAL LOW (ref 13.0–18.0)
MCH: 30.2 pg (ref 26.0–34.0)
MCHC: 34.1 g/dL (ref 32.0–36.0)
MCV: 88.5 fL (ref 80.0–100.0)
Platelets: 133 10*3/uL — ABNORMAL LOW (ref 150–440)
RBC: 2.74 MIL/uL — ABNORMAL LOW (ref 4.40–5.90)
RDW: 14.5 % (ref 11.5–14.5)
WBC: 12.4 10*3/uL — ABNORMAL HIGH (ref 3.8–10.6)

## 2016-04-17 LAB — BASIC METABOLIC PANEL
Anion gap: 9 (ref 5–15)
BUN: 27 mg/dL — ABNORMAL HIGH (ref 6–20)
CO2: 22 mmol/L (ref 22–32)
Calcium: 8.3 mg/dL — ABNORMAL LOW (ref 8.9–10.3)
Chloride: 106 mmol/L (ref 101–111)
Creatinine, Ser: 1.47 mg/dL — ABNORMAL HIGH (ref 0.61–1.24)
GFR calc Af Amer: 47 mL/min — ABNORMAL LOW (ref >60)
GFR calc non Af Amer: 40 mL/min — ABNORMAL LOW (ref >60)
Glucose, Bld: 136 mg/dL — ABNORMAL HIGH (ref 65–99)
Potassium: 4.5 mmol/L (ref 3.5–5.1)
Sodium: 137 mmol/L (ref 135–145)

## 2016-04-17 LAB — GLUCOSE, CAPILLARY
Glucose-Capillary: 122 mg/dL — ABNORMAL HIGH (ref 65–99)
Glucose-Capillary: 155 mg/dL — ABNORMAL HIGH (ref 65–99)

## 2016-04-17 MED ORDER — OXYCODONE HCL 5 MG PO TABS: 5.0000 mg | ORAL_TABLET | ORAL | 0 refills | Status: DC | PRN

## 2016-04-17 MED ORDER — ONDANSETRON HCL 4 MG PO TABS: 4.0000 mg | ORAL_TABLET | Freq: Four times a day (QID) | ORAL | 0 refills | Status: DC | PRN

## 2016-04-17 MED ORDER — ACETAMINOPHEN 500 MG PO TABS: 1000.0000 mg | ORAL_TABLET | Freq: Four times a day (QID) | ORAL | 0 refills | Status: DC

## 2016-04-17 MED ORDER — METOPROLOL TARTRATE 25 MG PO TABS
12.5000 mg | ORAL_TABLET | Freq: Two times a day (BID) | ORAL | Status: DC
  Administered 2016-04-17 – 2016-04-18 (×2): 12.5 mg via ORAL
  Filled 2016-04-17 (×3): qty 1

## 2016-04-17 MED ORDER — PROMETHAZINE HCL 25 MG PO TABS: 12.5000 mg | ORAL_TABLET | Freq: Four times a day (QID) | ORAL | Status: DC | PRN

## 2016-04-17 MED ORDER — CLONAZEPAM 0.5 MG PO TABS: 0.5000 mg | ORAL_TABLET | Freq: Three times a day (TID) | ORAL | 2 refills | Status: DC | PRN

## 2016-04-17 MED ORDER — DOCUSATE SODIUM 100 MG PO CAPS: 100.0000 mg | ORAL_CAPSULE | Freq: Two times a day (BID) | ORAL | 0 refills | Status: DC

## 2016-04-17 MED ORDER — ENOXAPARIN SODIUM 40 MG/0.4ML ~~LOC~~ SOLN: 40.0000 mg | SUBCUTANEOUS | Status: DC

## 2016-04-17 NOTE — Care Management Important Message (Signed)
Important Message  Patient Details  Name: [name] MRN: [id number] Date of Birth: 1923-05-26   Medicare Important Message Given:  Yes    Jolly Mango, RN 04/17/2016, 11:56 AM

## 2016-04-17 NOTE — Progress Notes (Signed)
Plan is for patient to D/C to [facility] over the weekend to room 209-B. RN will call report at [phone]. Clinical Education officer, museum (CSW) sent D/C orders to [employer] at [place] today. Patient and his daughter Freda Munro and wife Posey Pronto are aware of above. CSW will continue to follow and assist as needed.   [employer], LCSW [phone]

## 2016-04-17 NOTE — Anesthesia Postprocedure Evaluation (Signed)
Anesthesia Post Note  Patient: [name]  Procedure(s) Performed: Procedure(s) (LRB): ANTERIOR APPROACH HEMI HIP ARTHROPLASTY (Right)  Patient location during evaluation: Nursing Unit Anesthesia Type: Spinal Level of consciousness: awake, awake and alert and oriented Pain management: pain level controlled Vital Signs Assessment: post-procedure vital signs reviewed and stable Respiratory status: spontaneous breathing, nonlabored ventilation and respiratory function stable Cardiovascular status: stable Anesthetic complications: no     Last Vitals:  Vitals:   04/17/16 0002 04/17/16 0436  BP: (!) 109/46 (!) 120/47  Pulse: 88 80  Resp: 18 20  Temp: 37.6 C 37.4 C    Last Pain:  Vitals:   04/17/16 0436  TempSrc: Oral  PainSc:                  Lance Muss

## 2016-04-17 NOTE — Progress Notes (Signed)
   Subjective: 1 Day Post-Op Procedure(s) (LRB): ANTERIOR APPROACH HEMI HIP ARTHROPLASTY (Right) Patient reports pain as mild.   Patient is well, and has had no acute complaints or problems Denies any CP, SOB, ABD pain. We will start therapy today.    Objective: Vital signs in last 24 hours: Temp:  [98.4 F (36.9 C)-100.3 F (37.9 C)] 99.1 F (37.3 C) (03/02 0737) Pulse Rate:  [73-107] 78 (03/02 0737) Resp:  [15-23] 18 (03/02 0737) BP: (98-141)/(43-81) 122/43 (03/02 0737) SpO2:  [92 %-98 %] 94 % (03/02 0737) Weight:  [86.6 kg (191 lb)-91.7 kg (202 lb 1.6 oz)] 91.7 kg (202 lb 1.6 oz) (03/02 0436)  Intake/Output from previous day: 03/01 0701 - 03/02 0700 In: 2128.3 [I.V.:2028.3; IV Piggyback:100] Out: 0321 [Urine:1100; Drains:30; Blood:700] Intake/Output this shift: No intake/output data recorded.   Recent Labs  04/15/16 1823 04/16/16 0330  HGB 11.5* 10.6*    Recent Labs  04/15/16 1823 04/16/16 0330  WBC 17.1* 12.2*  RBC 3.88* 3.49*  HCT 35.1* 31.2*  PLT 175 147*    Recent Labs  04/16/16 0330 04/17/16 0439  NA 136 137  K 4.4 4.5  CL 106 106  CO2 23 22  BUN 27* 27*  CREATININE 1.44* 1.47*  GLUCOSE 94 136*  CALCIUM 8.7* 8.3*    Recent Labs  04/15/16 1823  INR 1.01    EXAM General - Patient is Alert, Appropriate and Oriented Extremity - Neurovascular intact Sensation intact distally Intact pulses distally Dorsiflexion/Plantar flexion intact No cellulitis present Compartment soft Dressing - dressing C/D/I and hemovac intact Motor Function - intact, moving foot and toes well on exam.   Past Medical History:  Diagnosis Date  . Anxiety   . BPH (benign prostatic hypertrophy)   . Carotid stenosis   . Chronic back pain   . Coronary artery disease   . Dyslipidemia   . GERD (gastroesophageal reflux disease)   . Hx of [name]   . Hyperlipidemia   . Hypertension   . Inguinal hernia   . 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 Major depression    . Multiple myeloma ([place])   . Pleural effusion    left  . Sleep apnea     Assessment/Plan:   1 Day Post-Op Procedure(s) (LRB): ANTERIOR APPROACH HEMI HIP ARTHROPLASTY (Right) Active Problems:   Closed right hip fracture, initial encounter (HCC)  Estimated body mass index is 29 kg/m as calculated from the following:   Height as of this encounter: _0  (1.778 m).   Weight as of this encounter: 91.7 kg (202 lb 1.6 oz). Advance diet Up with therapy  Needs BM Labs pending this am Pain well controlled, no complaints. Will start PT this am.  DVT Prophylaxis - Lovenox, Foot Pumps and TED hose Weight-Bearing as tolerated to right leg   T. Rachelle Hora, PA-C [place] 04/17/2016, 8:24 AM

## 2016-04-17 NOTE — Progress Notes (Signed)
[place] at [facility]                                                                                                                                                                                  Patient Demographics   [place], is a 83 y.o. male, DOB - 02/02/1924, [id number]  Admit date - 04/15/2016   Admitting Physician Epifanio Lesches, MD  Outpatient Primary MD for the patient is Keith Rake, MD   LOS - 2  Subjective: Complains of nausea and dizziness no chest pain or shortness of breath  Review of Systems:   CONSTITUTIONAL: No documented fever. No fatigue, weakness. No weight gain, no weight loss.  EYES: No blurry or double vision.  [name]: No tinnitus. No postnasal drip. No redness of the oropharynx.  RESPIRATORY: No cough, no wheeze, no hemoptysis. No dyspnea.  CARDIOVASCULAR: No chest pain. No orthopnea. No palpitations. No syncope.  GASTROINTESTINAL:Positive nausea, no vomiting or diarrhea. No abdominal pain. No melena or hematochezia.  GENITOURINARY: No dysuria or hematuria.  ENDOCRINE: No polyuria or nocturia. No heat or cold intolerance.  HEMATOLOGY: No anemia. No bruising. No bleeding.  INTEGUMENTARY: No rashes. No lesions.  MUSCULOSKELETAL: No arthritis. No swelling. No gout. Positive hip pain NEUROLOGIC: No numbness, tingling, or ataxia. No seizure-type activity.  PSYCHIATRIC: No anxiety. No insomnia. No ADD.    Vitals:   Vitals:   04/17/16 0002 04/17/16 0436 04/17/16 0737 04/17/16 1206  BP: (!) 109/46 (!) 120/47 (!) 122/43 (!) 99/34  Pulse: 88 80 78 72  Resp: 18 20 18 18   Temp: 99.7 F (37.6 C) 99.3 F (37.4 C) 99.1 F (37.3 C)   TempSrc: Oral Oral Oral   SpO2: 94% 92% 94% 95%  Weight:  202 lb 1.6 oz (91.7 kg)    Height:        Wt Readings from Last 3 Encounters:  04/17/16 202 lb 1.6 oz (91.7 kg)  03/23/16 193 lb 6 oz (87.7 kg)  02/19/16 198 lb (89.8 kg)     Intake/Output Summary (Last 24 hours) at 04/17/16  1233 Last data filed at 04/17/16 0400  Gross per 24 hour  Intake          2128.33 ml  Output             1830 ml  Net           298.33 ml    Physical Exam:   GENERAL: Pleasant-appearing in no apparent distress.  HEAD, EYES, EARS, NOSE AND THROAT: Atraumatic, normocephalic. Extraocular muscles are intact. Pupils equal and reactive to light. Sclerae anicteric. No conjunctival injection. No oro-pharyngeal erythema.  NECK: Supple. There is  no jugular venous distention. No bruits, no lymphadenopathy, no thyromegaly.  HEART: Regular rate and rhythm,. No murmurs, no rubs, no clicks.  LUNGS: Occasional rhonchus breath sounds no sensory muscle usage no wheezing  ABDOMEN: Soft, flat, nontender, nondistended. Has good bowel sounds. No hepatosplenomegaly appreciated.  EXTREMITIES: No evidence of any cyanosis, clubbing, or peripheral edema.  +2 pedal and radial pulses bilaterally.  NEUROLOGIC: The patient is alert, awake, and oriented x3 with no focal motor or sensory deficits appreciated bilaterally.  SKIN: Moist and warm with no rashes appreciated.  Psych: Not anxious, depressed LN: No inguinal LN enlargement    Antibiotics   Anti-infectives    Start     Dose/Rate Route Frequency Ordered Stop   04/16/16 2000  ceFAZolin (ANCEF) IVPB 2 g/50 mL premix     2 g 100 mL/hr over 30 Minutes Intravenous Every 6 hours 04/16/16 1753 04/17/16 0905   04/16/16 1745  ceFAZolin (ANCEF) IVPB 2g/100 mL premix  Status:  Discontinued     2 g 200 mL/hr over 30 Minutes Intravenous Every 6 hours 04/16/16 1742 04/16/16 1750   04/16/16 1300  ceFAZolin (ANCEF) IVPB 2g/100 mL premix  Status:  Discontinued     2 g 200 mL/hr over 30 Minutes Intravenous  Once 04/15/16 2321 04/15/16 2327   04/15/16 2330  ceFAZolin (ANCEF) IVPB 2 g/50 mL premix     2 g 100 mL/hr over 30 Minutes Intravenous  Once 04/15/16 2327 04/16/16 1455   04/15/16 2200  clindamycin (CLEOCIN) capsule 300 mg  Status:  Discontinued     300 mg Oral 4  times daily 04/15/16 1946 04/15/16 1953   04/15/16 2000  levofloxacin (LEVAQUIN) IVPB 750 mg  Status:  Discontinued     750 mg 100 mL/hr over 90 Minutes Intravenous Every 48 hours 04/15/16 1955 04/16/16 1159      Medications   Scheduled Meds: . acetaminophen  1,000 mg Oral Q6H  . docusate sodium  100 mg Oral BID  . doxazosin  1 mg Oral Daily  . enoxaparin (LOVENOX) injection  40 mg Subcutaneous Q24H  . metoprolol tartrate  25 mg Oral BID  . pantoprazole  40 mg Oral Daily  . polyethylene glycol  17 g Oral Daily  . pravastatin  40 mg Oral Daily  . sertraline  150 mg Oral QHS   Continuous Infusions: . sodium chloride 75 mL/hr at 04/15/16 2137  . sodium chloride 100 mL/hr at 04/17/16 0443  . lactated ringers 50 mL/hr at 04/16/16 1258   PRN Meds:.acetaminophen **OR** acetaminophen, alum & mag hydroxide-simeth, bisacodyl, clonazePAM, diphenhydrAMINE, HYDROmorphone (DILAUDID) injection, magnesium citrate, magnesium hydroxide, menthol-cetylpyridinium **OR** phenol, methocarbamol **OR** methocarbamol (ROBAXIN)  IV, morphine injection, nitroGLYCERIN, ondansetron **OR** ondansetron (ZOFRAN) IV, oxyCODONE, promethazine, traZODone, zolpidem   Data Review:   Micro Results Recent Results (from the past 240 hour(s))  MRSA PCR Screening     Status: None   Collection Time: 04/15/16  9:28 PM  Result Value Ref Range Status   MRSA by PCR NEGATIVE NEGATIVE Final    Comment:        The GeneXpert MRSA Assay (FDA approved for NASAL specimens only), is one component of a comprehensive MRSA colonization surveillance program. It is not intended to diagnose MRSA infection nor to guide or monitor treatment for MRSA infections.     Radiology Reports Dg Chest 1 View  Result Date: 04/15/2016 CLINICAL DATA:  Right hip fracture. EXAM: CHEST 1 VIEW COMPARISON:  Radiographs and CT 02/19/2016, chest radiograph 12/15/2013 FINDINGS: Partially  loculated left pleural effusion and left basilar opacity are  unchanged from comparison exams. The heart size and mediastinal contours are unchanged, left heart border obscured by effusion and basilar changes. There is atherosclerosis of the thoracic aorta. No pneumothorax. Right lung is clear. No acute rib fractures are seen. IMPRESSION: Stable partially loculated left pleural effusion and basilar opacity, chronic. No acute abnormality. Thoracic aortic atherosclerosis. Electronically Signed   By: Jeb Levering M.D.   On: 04/15/2016 18:40   Dg Hip Operative Unilat W Or W/o Pelvis Right  Result Date: 04/16/2016 CLINICAL DATA:  Right hip hemiarthroplasty EXAM: OPERATIVE right HIP (WITH PELVIS IF PERFORMED) 2 VIEWS TECHNIQUE: Fluoroscopic spot image(s) were submitted for interpretation post-operatively. COMPARISON:  Pelvis of right hip films of 04/15/2016 FINDINGS: Two C-arm spot films were returned. These show right hip replacement in good position with no complicating features. The distal stem of the femoral component is not visualized. IMPRESSION: Right total hip replacement with no complicating features. Electronically Signed   By: Ivar Drape M.D.   On: 04/16/2016 16:34   Dg Hip Unilat W Or W/o Pelvis 2-3 Views Right  Result Date: 04/16/2016 CLINICAL DATA:  Status post right hip joint prosthesis placement for fracture. EXAM: DG HIP (WITH OR WITHOUT PELVIS) 2-3V RIGHT COMPARISON:  Pre operative study of today's date FINDINGS: The patient has undergone right hip joint prosthesis placement. Radiographic positioning of the prosthetic components is good. The interface with the native bone appears normal. Two surgical drain lines are present as well as skin staples. IMPRESSION: No immediate postprocedure complication following right hip joint prosthesis placement. Electronically Signed   By: David  Martinique M.D.   On: 04/16/2016 17:18   Dg Hip Unilat W Or Wo Pelvis 2-3 Views Right  Result Date: 04/15/2016 CLINICAL DATA:  Patient fell on right hip.  Right hip pain. EXAM:  DG HIP (WITH OR WITHOUT PELVIS) 2-3V RIGHT COMPARISON:  None. FINDINGS: Bones are diffusely demineralized. Frontal pelvis with AP and frog-leg lateral views of the right hip show a right femoral neck fracture. Assessment is somewhat limited by superimposition of bony anatomy on all projections, but fracture line likely transcervical to subcapital in position. Bony over riding and varus angulation evident. Patient is status post left hip replacement. IMPRESSION: Right femoral neck fracture, likely subcapital to transcervical position although assessment somewhat limited by superimposition of bony anatomy. Electronically Signed   By: Misty [name] M.D.   On: 04/15/2016 18:38     CBC  Recent Labs Lab 04/15/16 1823 04/16/16 0330 04/17/16 0439  WBC 17.1* 12.2* 12.4*  HGB 11.5* 10.6* 8.3*  HCT 35.1* 31.2* 24.3*  PLT 175 147* 133*  MCV 90.4 89.4 88.5  MCH 29.6 30.4 30.2  MCHC 32.8 34.0 34.1  RDW 14.5 14.3 14.5  LYMPHSABS  --   --  0.7*  MONOABS  --   --  1.0  EOSABS  --   --  0.3  BASOSABS  --   --  0.0    Chemistries   Recent Labs Lab 04/15/16 1823 04/16/16 0330 04/17/16 0439  NA 137 136 137  K 4.1 4.4 4.5  CL 104 106 106  CO2 23 23 22   GLUCOSE 118* 94 136*  BUN 31* 27* 27*  CREATININE 1.45* 1.44* 1.47*  CALCIUM 9.3 8.7* 8.3*   ------------------------------------------------------------------------------------------------------------------ estimated creatinine clearance is 38 mL/min (by C-G formula based on SCr of 1.47 mg/dL (H)). ------------------------------------------------------------------------------------------------------------------ No results for input(s): HGBA1C in the last 72 hours. ------------------------------------------------------------------------------------------------------------------ No results  for input(s): CHOL, HDL, LDLCALC, TRIG, CHOLHDL, LDLDIRECT in the last 72  hours. ------------------------------------------------------------------------------------------------------------------ No results for input(s): TSH, T4TOTAL, T3FREE, THYROIDAB in the last 72 hours.  Invalid input(s): FREET3 ------------------------------------------------------------------------------------------------------------------ No results for input(s): VITAMINB12, FOLATE, FERRITIN, TIBC, IRON, RETICCTPCT in the last 72 hours.  Coagulation profile  Recent Labs Lab 04/15/16 1823  INR 1.01    No results for input(s): DDIMER in the last 72 hours.  Cardiac Enzymes No results for input(s): CKMB, TROPONINI, MYOGLOBIN in the last 168 hours.  Invalid input(s): CK ------------------------------------------------------------------------------------------------------------------ Invalid input(s): POCBNP    Assessment & Plan   83 year old male patient with multiple medical problems had a fall, noted to have a right femoral neck fracture 1 .right femoral neck fracture: Status post repair patient having nausea and feeling dizzy supportive care    2 loculated pleural effusion on the left side, appears to be chronic similar type of x-ray findings and CT scan findings last year. No further workup needed I suspect the elevated WBC count was due to stress reaction    3.Marland Kitchenacute on chronic renal insufficiency with the creatinine bumped up to 1.45, baseline is 1.31:  Close to chronic continue to monitor  4. For generalized anxiety:  Continue Klonopin.  5. coronary artery disease: continue metoprolol, Imdur, statins.  6. History of depression: Continue Zoloft.       Code Status Orders        Start     Ordered   04/15/16 1945  Full code  Continuous     04/15/16 1946    Code Status History    Date Active Date Inactive Code Status Order ID Comments User Context   This patient has a current code status but no historical code status.    Advance Directive Documentation    Flowsheet Row Most Recent Value  Type of Advance Directive  Healthcare Power of Attorney, Living will  Pre-existing out of facility DNR order (yellow form or pink MOST form)  No data  "MOST" Form in Place?  No data           Consults  Orthopedics   DVT Prophylaxis recommend Lovenox.  Lab Results  Component Value Date   PLT 133 (L) 04/17/2016     Time Spent in minutes  68min  Greater than 50% of time spent in care coordination and counseling patient regarding the condition and plan of care.   Dustin Flock M.D on 04/17/2016 at 12:33 PM  Between 7am to 6pm - Pager - [phone]  After 6pm go to [email or web address] - password [email or web address] [place] [facility]   Office  [phone]

## 2016-04-17 NOTE — Discharge Instructions (Signed)
[place] at [place]:  Cardiac diet  DISCHARGE CONDITION:  Stable  ACTIVITY:  Activity as tolerated  OXYGEN:  Home Oxygen: No.   Oxygen Delivery: room air  DISCHARGE LOCATION:  nursing home    ADDITIONAL DISCHARGE INSTRUCTION: wound care and staple care per ortho   If you experience worsening of your admission symptoms, develop shortness of breath, life threatening emergency, suicidal or homicidal thoughts you must seek medical attention immediately by calling 911 or calling your MD immediately  if symptoms less severe.  You Must read complete instructions/literature along with all the possible adverse reactions/side effects for all the Medicines you take and that have been prescribed to you. Take any new Medicines after you have completely understood and accpet all the possible adverse reactions/side effects.   Please note  You were cared for by a hospitalist during your hospital stay. If you have any questions about your discharge medications or the care you received while you were in the hospital after you are discharged, you can call the unit and asked to speak with the hospitalist on call if the hospitalist that took care of you is not available. Once you are discharged, your primary care physician will handle any further medical issues. Please note that NO REFILLS for any discharge medications will be authorized once you are discharged, as it is imperative that you return to your primary care physician (or establish a relationship with a primary care physician if you do not have one) for your aftercare needs so that they can reassess your need for medications and monitor your lab values.

## 2016-04-17 NOTE — Evaluation (Signed)
Physical Therapy Evaluation Patient Details Name: [name] MRN: [id number] DOB: 1924/01/18 Today's Date: 04/17/2016   History of Present Illness  Pt admitted for R femoral neck fx and now is s/p R ant. hip hemi arthroplasty on 04/16/16. History includes HTN, anxiety, and carotid stenosis.   Clinical Impression  Pt is a pleasant 83 year old male who was admitted for R hip hemi arthroplasty. Pt performs bed mobility with mod assist, transfers with mod assist +2, and ambulation with mod assist +2 and RW. Post leaning noted along with tremors with OOB mobility. Pt demonstrates deficits with strength/mobility/pain. Pt reports limited pain this date however is far off of baseline status at this time. Would benefit from skilled PT to address above deficits and promote optimal return to PLOF; recommend transition to STR upon discharge from acute hospitalization.       Follow Up Recommendations SNF    Equipment Recommendations  None recommended by PT    Recommendations for Other Services       Precautions / Restrictions Precautions Precautions: Fall;Anterior Hip Precaution Booklet Issued: No Restrictions Weight Bearing Restrictions: Yes RLE Weight Bearing: Weight bearing as tolerated      Mobility  Bed Mobility Overal bed mobility: Needs Assistance Bed Mobility: Supine to Sit     Supine to sit: Mod assist     General bed mobility comments: assist for sliding B LE off bed and assist for trunk assist. Once seated at EOB, pt able to sit with upright posture. Pt complains of dizziness with sitting, however resolves with rest break for about 2 mins.  Transfers Overall transfer level: Needs assistance Equipment used: Rolling walker (2 wheeled) Transfers: Sit to/from Stand Sit to Stand: Mod assist;+2 physical assistance;From elevated surface         General transfer comment: 1st attempt with max assist and RW. Pt with heavy post leaning and heavy tremors. Not able to fully stand  upright. 2nd attempt with +2 assist with improved technique and upright posture. Heavy cues to lean forward prior to standing trials. Decreased tremors noted on 2nd attempt.  Ambulation/Gait Ambulation/Gait assistance: Mod assist;+2 physical assistance Ambulation Distance (Feet): 3 Feet Assistive device: Rolling walker (2 wheeled) Gait Pattern/deviations: Step-to pattern     General Gait Details: ambulated with short step to gait pattern to recliner. Cues given for safe technique of stepping and keeping RW close to body. Pt able to follow commands well. Very unsteady and needs cues for upright posture.   Stairs            Wheelchair Mobility    Modified Rankin (Stroke Patients Only)       Balance Overall balance assessment: History of Falls;Needs assistance Sitting-balance support: Feet supported;Bilateral upper extremity supported Sitting balance-Leahy Scale: Fair   Postural control: Posterior lean Standing balance support: Bilateral upper extremity supported Standing balance-Leahy Scale: Fair                               Pertinent Vitals/Pain Pain Assessment: 0-10 Pain Score: 2  Pain Location: R hip Pain Descriptors / Indicators: Operative site guarding;Discomfort;Dull Pain Intervention(s): Limited activity within patient's tolerance    Home Living Family/patient expects to be discharged to:: Private residence Living Arrangements: Spouse/significant other Available Help at Discharge: Family Type of Home: Independent living facility Home Access: Level entry;Elevator     Home Layout: One level Home Equipment: Environmental consultant - 4 wheels;Cane - single point  Prior Function Level of Independence: Independent with assistive device(s)         Comments: uses rollater for mobility     Hand Dominance        Extremity/Trunk Assessment   Upper Extremity Assessment Upper Extremity Assessment: Generalized weakness (B UE grossly 4/5)    Lower  Extremity Assessment Lower Extremity Assessment: Generalized weakness (R LE grossly 3-/5; L LE grossly 4/5)       Communication   Communication: No difficulties  Cognition Arousal/Alertness: Awake/alert Behavior During Therapy: WFL for tasks assessed/performed Overall Cognitive Status: Within Functional Limits for tasks assessed                      General Comments      Exercises Other Exercises Other Exercises: supine ther-ex performed on R LE including ankle pumps, quad sets, SLRs, and hip abd/add. All ther-ex performed x 10 reps on min assist and cues for correct technique.   Assessment/Plan    PT Assessment Patient needs continued PT services  PT Problem List Decreased strength;Decreased activity tolerance;Decreased balance;Decreased mobility;Decreased safety awareness;Pain       PT Treatment Interventions Gait training;DME instruction;Therapeutic exercise    PT Goals (Current goals can be found in the Care Plan section)  Acute Rehab PT Goals Patient Stated Goal: to get stronger PT Goal Formulation: With patient Time For Goal Achievement: 05/01/16 Potential to Achieve Goals: Good    Frequency BID   Barriers to discharge        Co-evaluation               End of Session Equipment Utilized During Treatment: Gait belt Activity Tolerance: Patient tolerated treatment well Patient left: in chair;with chair alarm set;with family/visitor present;with SCD's reapplied Nurse Communication: Mobility status PT Visit Diagnosis: Unsteadiness on feet (R26.81);Muscle weakness (generalized) (M62.81);History of falling (Z91.81);Pain Pain - Right/Left: Right Pain - part of body: Hip         Time: [id number] PT Time Calculation (min) (ACUTE ONLY): 23 min   Charges:   PT Evaluation $PT Eval Moderate Complexity: 1 Procedure PT Treatments $Therapeutic Exercise: 8-22 mins   PT G Codes:         Mamie Hundertmark Apr 22, 2016, 11:33 AM  Greggory Stallion, PT,  DPT [phone]

## 2016-04-17 NOTE — Evaluation (Signed)
Occupational Therapy Evaluation Patient Details Name: [name] MRN: [id number] DOB: [birth year] Today's Date: 04/17/2016    History of Present Illness Pt. is a 83 y.o. male who was admitted to [facility] with a right anterior approach hemiarthroplasty repair of a femoral neck fracture. pt. PMHx includes: Lymphoblastic Lymphoma, Multiple Myeloma, Anxiety, BPH, Carotid Stenosis, [name], Dyslipidemia, Bells Palsy and Pleural Effusion.   Clinical Impression   Pt. Is a 83 y.o. Male who was admitted to [name] for a right anterior approach hemiarthroplasty repair of a right femoral neck Ffacture. Pt. Presents with weakness, pain, nausea and vomiting, and decreased activity tolerance, and impaired functional mobility which hinder his ability to complete ADL and IADL tasks. Pt. Could benefit from skilled OT services for ADl training, A/E training, UE there. Ex, there. Activity, and pt. education about work simplification strategies, and DME. Pt. Plans to go to SNF upon discharge.    Follow Up Recommendations  SNF    Equipment Recommendations       Recommendations for Other Services Rehab consult     Precautions / Restrictions Precautions Precautions: Fall Precaution Booklet Issued: No Restrictions Weight Bearing Restrictions: Yes RLE Weight Bearing: Weight bearing as tolerated              ADL Overall ADL's : Needs assistance/impaired Eating/Feeding: Set up   Grooming: Set up               Lower Body Dressing: Maximal assistance               Functional mobility during ADLs: Maximal assistance;+2 for physical assistance General ADL Comments: Pt. education was provided about A/E use for LE ADLs.  Pt. has A?E from previous hip surgery.     Vision Baseline Vision/History: Wears glasses Patient Visual Report: No change from baseline       Perception     Praxis      Pertinent Vitals/Pain Pain Assessment: 0-10 Pain Score: 2  Pain Location: Right Hip Pain Descriptors /  Indicators: Operative site guarding;Discomfort;Dull Pain Intervention(s): Limited activity within patient's tolerance     Hand Dominance Right   Extremity/Trunk Assessment Upper Extremity Assessment Upper Extremity Assessment: Generalized weakness   Lower Extremity Assessment Lower Extremity Assessment: Generalized weakness (R LE grossly 3-/5; L LE grossly 4/5)       Communication Communication Communication: No difficulties   Cognition Arousal/Alertness: Awake/alert Behavior During Therapy: WFL for tasks assessed/performed Overall Cognitive Status: Within Functional Limits for tasks assessed                     General Comments       Exercises   Shoulder Instructions      Home Living Family/patient expects to be discharged to:: Private residence Living Arrangements: Spouse/significant other Available Help at Discharge: Family Type of Home: Independent living facility Home Access: Stairs to enter     Home Layout: One level     Bathroom Shower/Tub: Walk-in shower         Home Equipment: Shower seat;Hand held shower head          Prior Functioning/Environment Level of Independence: Independent with assistive device(s)        Comments: uses Marketing executive for mobility        OT Problem List: Decreased strength;Decreased activity tolerance;Pain;Decreased knowledge of use of DME or AE      OT Treatment/Interventions: Self-care/ADL training;Therapeutic exercise;DME and/or AE instruction;Patient/family education;Therapeutic activities;Energy conservation    OT Goals(Current goals can be found  in the care plan section) Acute Rehab OT Goals Patient Stated Goal: To get stronger OT Goal Formulation: With patient Potential to Achieve Goals: Good  OT Frequency: Min 1X/week   Barriers to D/C:            Co-evaluation              End of Session    Activity Tolerance: Patient tolerated treatment well Patient left: in chair;with call bell/phone  within reach;with chair alarm set;with family/visitor present                   ADL either performed or assessed with clinical judgement  Time: 9758-8325 OT Time Calculation (min): 17 min Charges:  OT General Charges $OT Visit: 1 Procedure OT Evaluation $OT Eval Moderate Complexity: 1 Procedure G-Codes:     Harrel Carina, MS, OTR/L    Harrel Carina 04/17/2016, 12:08 PM

## 2016-04-17 NOTE — Progress Notes (Signed)
Physical Therapy Treatment Patient Details Name: [name] MRN: [id number] DOB: 10-15-1923 Today's Date: 04/17/2016    History of Present Illness Pt. is a 83 y.o. male who was admitted to [facility] with a right anterior approach hemiartroplasty repair of a femoral neck fracture. pt. PMHx includes: Lymphoblastic Lymphoma, Multiple Myeloma, Anxiety, BPH, Carotid Stenosis, [name], Dyslipidemia, Bells Palsy and Pleural Effusion.    PT Comments    Pt is making gradual progress towards goals increased tolerance with there-ex. Educated on frequency and duration of there-ex. Pt still requires +2 assist for OOB mobility and is very shaky. High risk for falls. RW used, however pt fatigues quickly, only able to perform chair to bed this session. Will continue to progress.   Follow Up Recommendations  SNF     Equipment Recommendations  None recommended by PT    Recommendations for Other Services       Precautions / Restrictions Precautions Precautions: Fall Precaution Booklet Issued: Yes (comment) Restrictions Weight Bearing Restrictions: Yes RLE Weight Bearing: Weight bearing as tolerated    Mobility  Bed Mobility Overal bed mobility: Needs Assistance Bed Mobility: Sit to Supine     Supine to sit: Mod assist Sit to supine: Mod assist;+2 for physical assistance   General bed mobility comments: assist for bringing legs back in bed as well as scooting up towards HOB.  Transfers Overall transfer level: Needs assistance Equipment used: Rolling walker (2 wheeled) Transfers: Sit to/from Stand Sit to Stand: Mod assist;+2 physical assistance;From elevated surface         General transfer comment: RW used for mobility with heavy post leaning. B knee flexion. Heavy tremors noted upon standing.  Ambulation/Gait Ambulation/Gait assistance: Mod assist;+2 physical assistance Ambulation Distance (Feet): 3 Feet Assistive device: Rolling walker (2 wheeled) Gait Pattern/deviations: Step-to  pattern     General Gait Details: Pt with slight suffling gait with heavy tremors noted. Pt tries to sit prior to therapist instruction. Needs cues for keeping close to RW. Forward flexed posture   Stairs            Wheelchair Mobility    Modified Rankin (Stroke Patients Only)       Balance Overall balance assessment: History of Falls;Needs assistance Sitting-balance support: Feet supported;Bilateral upper extremity supported Sitting balance-Leahy Scale: Fair   Postural control: Posterior lean Standing balance support: Bilateral upper extremity supported Standing balance-Leahy Scale: Fair                      Cognition Arousal/Alertness: Awake/alert Behavior During Therapy: WFL for tasks assessed/performed Overall Cognitive Status: Within Functional Limits for tasks assessed                      Exercises Other Exercises Other Exercises: supine ther-ex performed on R LE including ankle pumps, quad sets, SLRs, glut set, hip abd/add, hip add squeezes, SLRs, and SAQ. All ther-ex performed x 10 reps with cues for correct technique and min assist for performance.    General Comments        Pertinent Vitals/Pain Pain Assessment: 0-10 Pain Score: 5  Pain Location: Right Hip Pain Descriptors / Indicators: Operative site guarding;Discomfort;Dull Pain Intervention(s): Limited activity within patient's tolerance;Premedicated before session    Home Living Family/patient expects to be discharged to:: Private residence Living Arrangements: Spouse/significant other Available Help at Discharge: Family Type of Home: Independent living facility Home Access: Stairs to enter   Home Layout: One level Home Equipment: Shower seat;Hand held shower  head      Prior Function Level of Independence: Independent with assistive device(s)      Comments: uses rollater for mobility   PT Goals (current goals can now be found in the care plan section) Acute Rehab PT  Goals Patient Stated Goal: To get stronger PT Goal Formulation: With patient Time For Goal Achievement: 05/01/16 Potential to Achieve Goals: Good Additional Goals Additional Goal #1: Pt will be able to perform bed mobility/transfers with cga and RW in order to improve functional independence Progress towards PT goals: Progressing toward goals    Frequency    BID      PT Plan Current plan remains appropriate    Co-evaluation             End of Session Equipment Utilized During Treatment: Gait belt Activity Tolerance: Patient tolerated treatment well Patient left: in bed;with bed alarm set;with SCD's reapplied Nurse Communication: Mobility status PT Visit Diagnosis: Unsteadiness on feet (R26.81);Muscle weakness (generalized) (M62.81);History of falling (Z91.81);Pain Pain - Right/Left: Right Pain - part of body: Hip     Time: 6195-0932 PT Time Calculation (min) (ACUTE ONLY): 23 min  Charges:  $Gait Training: 8-22 mins $Therapeutic Exercise: 8-22 mins                    G Codes:       Yassen Kinnett Apr 29, 2016, 2:11 PM  Greggory Stallion, PT, DPT [phone]

## 2016-04-17 NOTE — Discharge Summary (Deleted)
[facility] [place] [facility]  [name], 83 y.o., DOB 28-Nov-1923, MRN [id number]. Admission date: 04/15/2016 Discharge Date 04/18/2016 Primary MD Brayton El, MD Admitting Physician Katha Hamming, MD  Admission Diagnosis  Renal insufficiency [N28.9] Fall, initial encounter [W19.XXXA] Closed displaced fracture of right femoral neck (HCC) [S72.001A]  Discharge Diagnosis   Active Problems:   Closed right hip fracture, initial encounter (HCC)   Chronically loculated pleural effusion   Anxiety   BPH  Coronary artery disease  Dyslipidemia  GERD  Chronic kidney disease  History of [name] Hyperlipidemia unspecified Essential hypertension Invalid hernia\ Lymphoblastic lymphoma Multiple myeloma Sleep apnea       Hospital Course [name]  is a 83 y.o. male with a known history of Essential hypertension, generalized anxiety, hyperlipidemia, carotid stenosis, coronary artery disease, history of BPH had a fall this morning secondary to walking on uneven concrete surface, he lost his balance and fell to the right side. Patient was admitted to the hospital and was seen by orthopedics and underwent a surgery with anterior approach hemi-hip arthroplasty patient has had some nausea and dizziness postop period which is currently being treated.  Otherwise he feeling well denies any other complatins.    Instructions per ortho: Staples will need to be removed 2 weeks postop and apply benzoin and half-inch Steri-Strips Continue Lovenox 40 mg daily for 14 days after discharge TED stockings bilaterally for 6 weeks Elevate heels off bed using rolled towels Do not get the incision wet until staples are removed         Consults orthopedics  Significant Tests:  See full reports for all details     Dg Chest 1 View  Result Date: 04/15/2016 CLINICAL DATA:  Right hip fracture. EXAM: CHEST 1 VIEW COMPARISON:  Radiographs and CT 02/19/2016, chest radiograph  12/15/2013 FINDINGS: Partially loculated left pleural effusion and left basilar opacity are unchanged from comparison exams. The heart size and mediastinal contours are unchanged, left heart border obscured by effusion and basilar changes. There is atherosclerosis of the thoracic aorta. No pneumothorax. Right lung is clear. No acute rib fractures are seen. IMPRESSION: Stable partially loculated left pleural effusion and basilar opacity, chronic. No acute abnormality. Thoracic aortic atherosclerosis. Electronically Signed   By: Rubye Oaks M.D.   On: 04/15/2016 18:40   Dg Hip Operative Unilat W Or W/o Pelvis Right  Result Date: 04/16/2016 CLINICAL DATA:  Right hip hemiarthroplasty EXAM: OPERATIVE right HIP (WITH PELVIS IF PERFORMED) 2 VIEWS TECHNIQUE: Fluoroscopic spot image(s) were submitted for interpretation post-operatively. COMPARISON:  Pelvis of right hip films of 04/15/2016 FINDINGS: Two C-arm spot films were returned. These show right hip replacement in good position with no complicating features. The distal stem of the femoral component is not visualized. IMPRESSION: Right total hip replacement with no complicating features. Electronically Signed   By: Dwyane Dee M.D.   On: 04/16/2016 16:34   Dg Hip Unilat W Or W/o Pelvis 2-3 Views Right  Result Date: 04/16/2016 CLINICAL DATA:  Status post right hip joint prosthesis placement for fracture. EXAM: DG HIP (WITH OR WITHOUT PELVIS) 2-3V RIGHT COMPARISON:  Pre operative study of today's date FINDINGS: The patient has undergone right hip joint prosthesis placement. Radiographic positioning of the prosthetic components is good. The interface with the native bone appears normal. Two surgical drain lines are present as well as skin staples. IMPRESSION: No immediate postprocedure complication following right hip joint prosthesis placement. Electronically Signed   By: David  [place]  M.D.   On: 04/16/2016 17:18   Dg Hip Unilat W Or Wo Pelvis 2-3 Views  Right  Result Date: 04/15/2016 CLINICAL DATA:  Patient fell on right hip.  Right hip pain. EXAM: DG HIP (WITH OR WITHOUT PELVIS) 2-3V RIGHT COMPARISON:  None. FINDINGS: Bones are diffusely demineralized. Frontal pelvis with AP and frog-leg lateral views of the right hip show a right femoral neck fracture. Assessment is somewhat limited by superimposition of bony anatomy on all projections, but fracture line likely transcervical to subcapital in position. Bony over riding and varus angulation evident. Patient is status post left hip replacement. IMPRESSION: Right femoral neck fracture, likely subcapital to transcervical position although assessment somewhat limited by superimposition of bony anatomy. Electronically Signed   By: Misty [name] M.D.   On: 04/15/2016 18:38       Today   Subjective:   [name]  Complains of feeling nausea no chest pain or shortness of breath Blood pressure (!) 141/52, pulse 92, temperature 99.2 F (37.3 C), resp. rate 20, height '5\' 10"'$  (1.778 m), weight 205 lb (93 kg), SpO2 91 %.  .  Intake/Output Summary (Last 24 hours) at 04/18/16 1045 Last data filed at 04/18/16 0900  Gross per 24 hour  Intake          5838.75 ml  Output              590 ml  Net          5248.75 ml    Exam VITAL SIGNS: Blood pressure (!) 141/52, pulse 92, temperature 99.2 F (37.3 C), resp. rate 20, height '5\' 10"'$  (1.778 m), weight 205 lb (93 kg), SpO2 91 %.  GENERAL:  83 y.o.-year-old patient lying in the bed with no acute distress.  EYES: Pupils equal, round, reactive to light and accommodation. No scleral icterus. Extraocular muscles intact.  HEENT: Head atraumatic, normocephalic. Oropharynx and nasopharynx clear.  NECK:  Supple, no jugular venous distention. No thyroid enlargement, no tenderness.  LUNGS: Normal breath sounds bilaterally, no wheezing, rales,rhonchi or crepitation. No use of accessory muscles of respiration.  CARDIOVASCULAR: S1, S2 normal. No murmurs, rubs, or  gallops.  ABDOMEN: Soft, nontender, nondistended. Bowel sounds present. No organomegaly or mass.  EXTREMITIES: No pedal edema, cyanosis, or clubbing.  NEUROLOGIC: Cranial nerves II through XII are intact. Muscle strength 5/5 in all extremities. Sensation intact. Gait not checked.  PSYCHIATRIC: The patient is alert and oriented x 3.  SKIN: No obvious rash, lesion, or ulcer.   Data Review     CBC w Diff:  Lab Results  Component Value Date   WBC 12.7 (H) 04/18/2016   HGB 8.1 (L) 04/18/2016   HGB 8.7 (L) 12/18/2013   HCT 24.2 (L) 04/18/2016   HCT 25.6 (L) 12/17/2013   PLT 124 (L) 04/18/2016   PLT 156 12/17/2013   LYMPHOPCT 6 04/17/2016   LYMPHOPCT 4.0 12/17/2013   MONOPCT 8 04/17/2016   MONOPCT 8.6 12/17/2013   EOSPCT 2 04/17/2016   EOSPCT 0.7 12/17/2013   BASOPCT 0 04/17/2016   BASOPCT 0.1 12/17/2013   CMP:  Lab Results  Component Value Date   NA 137 04/18/2016   NA 139 09/06/2014   NA 136 12/18/2013   K 4.1 04/18/2016   K 4.5 12/18/2013   CL 108 04/18/2016   CL 103 12/18/2013   CO2 23 04/18/2016   CO2 26 12/18/2013   BUN 18 04/18/2016   BUN 27 09/06/2014   BUN 14 12/18/2013   CREATININE  1.14 04/18/2016   CREATININE 1.41 (H) 02/19/2016   PROT 7.7 02/19/2016   PROT 7.9 09/06/2014   PROT 9.3 (H) 06/03/2013   ALBUMIN 3.7 02/19/2016   ALBUMIN 4.0 09/06/2014   ALBUMIN 3.6 06/03/2013   BILITOT 0.5 02/19/2016   BILITOT 0.4 09/06/2014   BILITOT 0.5 06/03/2013   ALKPHOS 93 02/19/2016   ALKPHOS 94 06/03/2013   AST 17 02/19/2016   AST 28 06/03/2013   ALT 9 02/19/2016   ALT 21 06/03/2013  .  Micro Results Recent Results (from the past 240 hour(s))  MRSA PCR Screening     Status: None   Collection Time: 04/15/16  9:28 PM  Result Value Ref Range Status   MRSA by PCR NEGATIVE NEGATIVE Final    Comment:        The GeneXpert MRSA Assay (FDA approved for NASAL specimens only), is one component of a comprehensive MRSA colonization surveillance program. It is  not intended to diagnose MRSA infection nor to guide or monitor treatment for MRSA infections.         Code Status Orders        Start     Ordered   04/15/16 1945  Full code  Continuous     04/15/16 1946    Code Status History    Date Active Date Inactive Code Status Order ID Comments User Context   This patient has a current code status but no historical code status.    Advance Directive Documentation   Flowsheet Row Most Recent Value  Type of Advance Directive  Healthcare Power of Attorney, Living will  Pre-existing out of facility DNR order (yellow form or pink MOST form)  No data  "MOST" Form in Place?  No data          Contact information for after-discharge care    Destination    [facility] .   Specialty:  [place] information: [address] [place] [phone]              Discharge Medications   Allergies as of 04/18/2016      Reactions   Sulfamethoxazole-trimethoprim Nausea Only, Other (See Comments)   Baclofen    Nsaids    Avoids due to liver.      Medication List    STOP taking these medications   clindamycin 300 MG capsule Commonly known as:  CLEOCIN   lisinopril 2.5 MG tablet Commonly known as:  PRINIVIL,ZESTRIL     TAKE these medications   acetaminophen 500 MG tablet Commonly known as:  TYLENOL Take 2 tablets (1,000 mg total) by mouth every 6 (six) hours.   aspirin EC 81 MG tablet Take 1 tablet (81 mg total) by mouth daily.   clonazePAM 0.5 MG tablet Commonly known as:  KLONOPIN Take 1 tablet (0.5 mg total) by mouth 3 (three) times daily as needed for anxiety.   diclofenac sodium 1 % Gel Commonly known as:  VOLTAREN Apply 2 g topically 4 (four) times daily.   docusate sodium 100 MG capsule Commonly known as:  COLACE Take 1 capsule (100 mg total) by mouth 2 (two) times daily.   doxazosin 1 MG tablet Commonly known as:  CARDURA TAKE ONE TABLET BY  MOUTH ONCE DAILY   enoxaparin 40 MG/0.4ML injection Commonly known as:  LOVENOX Inject 0.4 mLs (40 mg total) into the skin daily.   isosorbide mononitrate 30 MG 24 hr tablet Commonly known as:  IMDUR Take 1 tablet (  30 mg total) by mouth daily.   metoprolol tartrate 25 MG tablet Commonly known as:  LOPRESSOR Take 0.5 tablets (12.5 mg total) by mouth 2 (two) times daily. What changed:  how much to take   nitroGLYCERIN 0.4 MG SL tablet Commonly known as:  NITROSTAT Place 0.4 mg under the tongue every 5 (five) minutes as needed for chest pain.   ondansetron 4 MG tablet Commonly known as:  ZOFRAN Take 1 tablet (4 mg total) by mouth every 6 (six) hours as needed for nausea.   oxyCODONE 5 MG immediate release tablet Commonly known as:  Oxy IR/ROXICODONE Take 1-2 tablets (5-10 mg total) by mouth every 3 (three) hours as needed for breakthrough pain.   pantoprazole 40 MG tablet Commonly known as:  PROTONIX Take 1 tablet (40 mg total) by mouth daily.   polyethylene glycol packet Commonly known as:  MIRALAX / GLYCOLAX Take 17 g by mouth daily.   pravastatin 40 MG tablet Commonly known as:  PRAVACHOL TAKE ONE TABLET BY MOUTH ONCE DAILY   PRESERVISION/LUTEIN PO Take by mouth 2 (two) times daily.   sertraline 100 MG tablet Commonly known as:  ZOLOFT Take 1.5 tablets (150 mg total) by mouth at bedtime.            Durable Medical Equipment        Start     Ordered   04/16/16 1743  DME 3 n 1  Once     04/16/16 1742         Total Time in preparing paper work, data evaluation and todays exam - 35 minutes  Dustin Flock M.D on 04/18/2016 at 10:45 AM  [place]  [phone]

## 2016-04-18 LAB — BASIC METABOLIC PANEL
Anion gap: 6 (ref 5–15)
BUN: 18 mg/dL (ref 6–20)
CO2: 23 mmol/L (ref 22–32)
Calcium: 8.2 mg/dL — ABNORMAL LOW (ref 8.9–10.3)
Chloride: 108 mmol/L (ref 101–111)
Creatinine, Ser: 1.14 mg/dL (ref 0.61–1.24)
GFR calc Af Amer: >60 mL/min (ref >60)
GFR calc non Af Amer: 55 mL/min — ABNORMAL LOW (ref >60)
Glucose, Bld: 125 mg/dL — ABNORMAL HIGH (ref 65–99)
Potassium: 4.1 mmol/L (ref 3.5–5.1)
Sodium: 137 mmol/L (ref 135–145)

## 2016-04-18 LAB — CBC
HCT: 24.2 % — ABNORMAL LOW (ref 40.0–52.0)
Hemoglobin: 8.1 g/dL — ABNORMAL LOW (ref 13.0–18.0)
MCH: 29.9 pg (ref 26.0–34.0)
MCHC: 33.2 g/dL (ref 32.0–36.0)
MCV: 90 fL (ref 80.0–100.0)
Platelets: 124 10*3/uL — ABNORMAL LOW (ref 150–440)
RBC: 2.69 MIL/uL — ABNORMAL LOW (ref 4.40–5.90)
RDW: 14.3 % (ref 11.5–14.5)
WBC: 12.7 10*3/uL — ABNORMAL HIGH (ref 3.8–10.6)

## 2016-04-18 LAB — GLUCOSE, CAPILLARY
Glucose-Capillary: 121 mg/dL — ABNORMAL HIGH (ref 65–99)
Glucose-Capillary: 109 mg/dL — ABNORMAL HIGH (ref 65–99)

## 2016-04-18 MED ORDER — METOPROLOL TARTRATE 25 MG PO TABS: 12.5000 mg | ORAL_TABLET | Freq: Two times a day (BID) | ORAL | 0 refills | Status: DC

## 2016-04-18 NOTE — Progress Notes (Signed)
   Subjective: 2 Days Post-Op Procedure(s) (LRB): ANTERIOR APPROACH HEMI HIP ARTHROPLASTY (Right) Patient reports pain as mild.   Patient is well, and has had no acute complaints or problems Continue with physical  therapy today.  Plan is to go Rehab after hospital stay. nausea Patient denies any chest pains or shortness of breath. Objective: Vital signs in last 24 hours: Temp:  [98.6 F (37 C)-99.1 F (37.3 C)] 98.6 F (37 C) (03/02 1946) Pulse Rate:  [72-93] 93 (03/03 0536) Resp:  [18-19] 18 (03/03 0536) BP: (99-135)/(34-55) 135/55 (03/03 0536) SpO2:  [90 %-95 %] 90 % (03/03 0536) Weight:  [93 kg (205 lb)] 93 kg (205 lb) (03/03 0500) well approximated incision Heels are non tender and elevated off the bed using rolled towels Intake/Output from previous day: 03/02 0701 - 03/03 0700 In: 5598.8 [P.O.:240; I.V.:5358.8] Out: 590 [Urine:500; Drains:90] Intake/Output this shift: Total I/O In: 768.3 [I.V.:768.3] Out: 590 [Urine:500; Drains:90]   Recent Labs  04/15/16 1823 04/16/16 0330 04/17/16 0439 04/18/16 0319  HGB 11.5* 10.6* 8.3* 8.1*    Recent Labs  04/17/16 0439 04/18/16 0319  WBC 12.4* 12.7*  RBC 2.74* 2.69*  HCT 24.3* 24.2*  PLT 133* 124*    Recent Labs  04/17/16 0439 04/18/16 0319  NA 137 137  K 4.5 4.1  CL 106 108  CO2 22 23  BUN 27* 18  CREATININE 1.47* 1.14  GLUCOSE 136* 125*  CALCIUM 8.3* 8.2*    Recent Labs  04/15/16 1823  INR 1.01    EXAM General - Patient is Alert and Appropriate Extremity - Neurologically intact Neurovascular intact Sensation intact distally Intact pulses distally Dorsiflexion/Plantar flexion intact No cellulitis present Compartment soft Dressing - scant drainage Motor Function - intact, moving foot and toes well on exam.    Past Medical History:  Diagnosis Date  . Anxiety   . BPH (benign prostatic hypertrophy)   . Carotid stenosis   . Chronic back pain   . Coronary artery disease   . Dyslipidemia    . GERD (gastroesophageal reflux disease)   . Hx of [name]   . Hyperlipidemia   . Hypertension   . Inguinal hernia   . Lymphoblastic lymphoma ([place])   . Major depression   . Multiple myeloma ([place])   . Pleural effusion    left  . Sleep apnea     Assessment/Plan: 2 Days Post-Op Procedure(s) (LRB): ANTERIOR APPROACH HEMI HIP ARTHROPLASTY (Right) Active Problems:   Closed right hip fracture, initial encounter (HCC)  Estimated body mass index is 29.41 kg/m as calculated from the following:   Height as of this encounter: '5\' 10"'$  (1.778 m).   Weight as of this encounter: 93 kg (205 lb). Up with therapy Discharge to SNF when medically stable  Labs: Were reviewed DVT Prophylaxis - Lovenox, Foot Pumps and TED hose Weight-Bearing as tolerated to right leg Hemovac discontinued on today's visit Patient will need follow-up in [facility] in 6 weeks with Dr. Ronalee Red will need to be removed 2 weeks postop and apply benzoin and half-inch Steri-Strips Continue Lovenox 40 mg daily for 14 days after discharge TED stockings bilaterally for 6 weeks Elevate heels off bed using rolled towels Do not get the incision wet until staples are removed  Anai Lipson R. Troy Callender 04/18/2016, 6:56 AM

## 2016-04-18 NOTE — Progress Notes (Signed)
Occupational Therapy Treatment Patient Details Name: BERLEY GAMBRELL MRN: [id number] DOB: Apr 25, 1923 Today's Date: 04/18/2016    History of present illness Pt. is a 83 y.o. male who was admitted to [facility] with a right anterior approach hemiartroplasty repair of a femoral neck fracture. pt. PMHx includes: Lymphoblastic Lymphoma, Multiple Myeloma, Anxiety, BPH, Carotid Stenosis, [name], Dyslipidemia, Bells Palsy and Pleural Effusion.   OT comments  Patient seen for OT tx session with focus on lower body dressing skills with use of adaptive equipment.  Patient able to participate in sitting with use of reacher to doff socks and instruction on sock aid to don socks.  Patient required min to moderate assistance with these tasks.  Patient requires increased assistance with pants and underwear and will need +2 to stand to negotiate over hips. Continue to work towards goals to increase independence in daily tasks. Recommend SNF prior to returning home.   Follow Up Recommendations  SNF    Equipment Recommendations       Recommendations for Other Services Rehab consult    Precautions / Restrictions Precautions Precautions: Fall Precaution Booklet Issued: Yes (comment) Restrictions Weight Bearing Restrictions: Yes RLE Weight Bearing: Weight bearing as tolerated       Mobility Bed Mobility Overal bed mobility: Needs Assistance Bed Mobility: Sit to Supine     Supine to sit: Mod assist Sit to supine: Mod assist;+2 for physical assistance   General bed mobility comments: assist for bringing legs back in bed as well as scooting up towards [place].  Transfers Overall transfer level: Needs assistance Equipment used: Rolling walker (2 wheeled) Transfers: Sit to/from Stand Sit to Stand: Min assist;+2 physical assistance              Balance Overall balance assessment: History of Falls;Needs assistance Sitting-balance support: Feet supported;Bilateral upper extremity supported Sitting balance-Leahy  Scale: Fair   Postural control: Posterior lean Standing balance support: Bilateral upper extremity supported Standing balance-Leahy Scale: Fair                     ADL Overall ADL's : Needs assistance/impaired                     Lower Body Dressing: +2 for physical assistance;Maximal assistance Lower Body Dressing Details (indicate cue type and reason): Patient instructed on LB dressing with use of adaptive equipment, able to follow commands and complete task in sitting for socks with min to moderate assist.  Pants and underwear max assist and patient requires +2 for transfer.                 General ADL Comments: Patient familiar with some of the adaptive equipment from previous surgery but reports he does not have a sock aid.       Vision                     Perception     Praxis      Cognition   Behavior During Therapy: WFL for tasks assessed/performed Overall Cognitive Status: Within Functional Limits for tasks assessed                         Exercises   Shoulder Instructions       General Comments      Pertinent Vitals/ Pain       Pain Assessment: 0-10 Pain Score: 4  Pain Location: Right Hip Pain Descriptors / Indicators: Operative site  guarding;Discomfort;Dull Pain Intervention(s): Limited activity within patient's tolerance;Monitored during session  Home Living                                          Prior Functioning/Environment              Frequency  Min 1X/week        Progress Toward Goals  OT Goals(current goals can now be found in the care plan section)  Progress towards OT goals: Progressing toward goals  Acute Rehab OT Goals Patient Stated Goal: To be able to get back home again.  OT Goal Formulation: With patient Potential to Achieve Goals: Good  Plan Discharge plan remains appropriate    Co-evaluation                 End of Session    OT Visit Diagnosis:  Muscle weakness (generalized) (M62.81);History of falling (Z91.81)   Activity Tolerance Patient tolerated treatment well   Patient Left in chair;with call bell/phone within reach;with chair alarm set;with family/visitor present   Nurse Communication          Time: 1030-1055 OT Time Calculation (min): 25 min  Charges: OT General Charges $OT Visit: 1 Procedure OT Treatments $Therapeutic Activity: 23-37 mins  Mera Gunkel T Etta Gassett, OTR/L, CLT   Vihan Santagata 04/18/2016, 11:16 AM

## 2016-04-18 NOTE — Clinical Social Work Note (Signed)
CSW met with the patient and his wife at bedside to discuss discharge today to [facility]. The patient and his wife chose non-emergent EMS for transport due to his hip pain and high fall risk. The facility is aware, and all documents have been sent. Packet delivered to unit. CSW will con't to follow pending additional dc needs.  Karen Martha White, MSW, LCSWA [phone] 

## 2016-04-18 NOTE — Progress Notes (Signed)
Report called to [facility]. Pt will be going to room 209-B

## 2016-04-18 NOTE — Discharge Summary (Signed)
[name], 83 y.o., DOB Oct 15, 1923, MRN [id number]. Admission date: 04/15/2016 Discharge Date 04/18/2016 Primary MD Brandon Rake, MD Admitting Physician Brandon Lesches, MD  Admission Diagnosis  Renal insufficiency [N28.9] Fall, initial encounter [W19.XXXA] Closed displaced fracture of right femoral neck ([place]) [S72.001A]  Discharge Diagnosis   Active Problems:   Closed right hip fracture, initial encounter ([place])   Chronically loculated pleural effusion   Anxiety   BPH  Coronary artery disease  Dyslipidemia  GERD  Chronic kidney disease  History of [name] Hyperlipidemia unspecified Essential hypertension Invalid hernia\ Lymphoblastic lymphoma Multiple myeloma Sleep apnea       Hospital Course [name]  is a 83 y.o. male with a known history of Essential hypertension, generalized anxiety, hyperlipidemia, carotid stenosis, coronary artery disease, history of BPH had a fall this morning secondary to walking on uneven concrete surface, he lost his balance and fell to the right side. Patient was admitted to the hospital and was seen by orthopedics and underwent a surgery with anterior approach hemi-hip arthroplasty patient has had some nausea and dizziness postop period which is currently being treated.  Otherwise he feeling well denies any other complatins.    Instructions per ortho: Staples will need to be removed 2 weeks postop and apply benzoin and half-inch Steri-Strips Continue Lovenox 40 mg daily for 14 days after discharge TED stockings bilaterally for 6 weeks Elevate heels off bed using rolled towels Do not get the incision wet until staples are removed         Consults orthopedics  Significant Tests:  See full reports for all details     Dg Chest 1 View  Result Date: 04/15/2016 CLINICAL DATA:  Right hip fracture. EXAM: CHEST 1 VIEW COMPARISON:  Radiographs and CT 02/19/2016, chest radiograph  12/15/2013 FINDINGS: Partially loculated left pleural effusion and left basilar opacity are unchanged from comparison exams. The heart size and mediastinal contours are unchanged, left heart border obscured by effusion and basilar changes. There is atherosclerosis of the thoracic aorta. No pneumothorax. Right lung is clear. No acute rib fractures are seen. IMPRESSION: Stable partially loculated left pleural effusion and basilar opacity, chronic. No acute abnormality. Thoracic aortic atherosclerosis. Electronically Signed   By: Jeb Levering M.D.   On: 04/15/2016 18:40   Dg Hip Operative Unilat W Or W/o Pelvis Right  Result Date: 04/16/2016 CLINICAL DATA:  Right hip hemiarthroplasty EXAM: OPERATIVE right HIP (WITH PELVIS IF PERFORMED) 2 VIEWS TECHNIQUE: Fluoroscopic spot image(s) were submitted for interpretation post-operatively. COMPARISON:  Pelvis of right hip films of 04/15/2016 FINDINGS: Two C-arm spot films were returned. These show right hip replacement in good position with no complicating features. The distal stem of the femoral component is not visualized. IMPRESSION: Right total hip replacement with no complicating features. Electronically Signed   By: Ivar Drape M.D.   On: 04/16/2016 16:34   Dg Hip Unilat W Or W/o Pelvis 2-3 Views Right  Result Date: 04/16/2016 CLINICAL DATA:  Status post right hip joint prosthesis placement for fracture. EXAM: DG HIP (WITH OR WITHOUT PELVIS) 2-3V RIGHT COMPARISON:  Pre operative study of today's date FINDINGS: The patient has undergone right hip joint prosthesis placement. Radiographic positioning of the prosthetic components is good. The interface with the native bone appears normal. Two surgical drain lines are present as well as skin staples. IMPRESSION: No immediate postprocedure complication following right hip joint prosthesis placement. Electronically Signed   By: David  Martinique  M.D.   On: 04/16/2016 17:18   Dg Hip Unilat W Or Wo Pelvis 2-3 Views  Right  Result Date: 04/15/2016 CLINICAL DATA:  Patient fell on right hip.  Right hip pain. EXAM: DG HIP (WITH OR WITHOUT PELVIS) 2-3V RIGHT COMPARISON:  None. FINDINGS: Bones are diffusely demineralized. Frontal pelvis with AP and frog-leg lateral views of the right hip show a right femoral neck fracture. Assessment is somewhat limited by superimposition of bony anatomy on all projections, but fracture line likely transcervical to subcapital in position. Bony over riding and varus angulation evident. Patient is status post left hip replacement. IMPRESSION: Right femoral neck fracture, likely subcapital to transcervical position although assessment somewhat limited by superimposition of bony anatomy. Electronically Signed   By: Misty [name] M.D.   On: 04/15/2016 18:38       Today   Subjective:   [name]  Complains of feeling nausea no chest pain or shortness of breath Blood pressure (!) 141/52, pulse 92, temperature 99.2 F (37.3 C), resp. rate 20, height '5\' 10"'$  (1.778 m), weight 205 lb (93 kg), SpO2 91 %.  .  Intake/Output Summary (Last 24 hours) at 04/18/16 1047 Last data filed at 04/18/16 0900  Gross per 24 hour  Intake          5838.75 ml  Output              590 ml  Net          5248.75 ml    Exam VITAL SIGNS: Blood pressure (!) 141/52, pulse 92, temperature 99.2 F (37.3 C), resp. rate 20, height '5\' 10"'$  (1.778 m), weight 205 lb (93 kg), SpO2 91 %.  GENERAL:  83 y.o.-year-old patient lying in the bed with no acute distress.  EYES: Pupils equal, round, reactive to light and accommodation. No scleral icterus. Extraocular muscles intact.  HEENT: Head atraumatic, normocephalic. Oropharynx and nasopharynx clear.  NECK:  Supple, no jugular venous distention. No thyroid enlargement, no tenderness.  LUNGS: Normal breath sounds bilaterally, no wheezing, rales,rhonchi or crepitation. No use of accessory muscles of respiration.  CARDIOVASCULAR: S1, S2 normal. No murmurs, rubs, or  gallops.  ABDOMEN: Soft, nontender, nondistended. Bowel sounds present. No organomegaly or mass.  EXTREMITIES: No pedal edema, cyanosis, or clubbing.  NEUROLOGIC: Cranial nerves II through XII are intact. Muscle strength 5/5 in all extremities. Sensation intact. Gait not checked.  PSYCHIATRIC: The patient is alert and oriented x 3.  SKIN: No obvious rash, lesion, or ulcer.   Data Review     CBC w Diff:  Lab Results  Component Value Date   WBC 12.7 (H) 04/18/2016   HGB 8.1 (L) 04/18/2016   HGB 8.7 (L) 12/18/2013   HCT 24.2 (L) 04/18/2016   HCT 25.6 (L) 12/17/2013   PLT 124 (L) 04/18/2016   PLT 156 12/17/2013   LYMPHOPCT 6 04/17/2016   LYMPHOPCT 4.0 12/17/2013   MONOPCT 8 04/17/2016   MONOPCT 8.6 12/17/2013   EOSPCT 2 04/17/2016   EOSPCT 0.7 12/17/2013   BASOPCT 0 04/17/2016   BASOPCT 0.1 12/17/2013   CMP:  Lab Results  Component Value Date   NA 137 04/18/2016   NA 139 09/06/2014   NA 136 12/18/2013   K 4.1 04/18/2016   K 4.5 12/18/2013   CL 108 04/18/2016   CL 103 12/18/2013   CO2 23 04/18/2016   CO2 26 12/18/2013   BUN 18 04/18/2016   BUN 27 09/06/2014   BUN 14 12/18/2013   CREATININE  1.14 04/18/2016   CREATININE 1.41 (H) 02/19/2016   PROT 7.7 02/19/2016   PROT 7.9 09/06/2014   PROT 9.3 (H) 06/03/2013   ALBUMIN 3.7 02/19/2016   ALBUMIN 4.0 09/06/2014   ALBUMIN 3.6 06/03/2013   BILITOT 0.5 02/19/2016   BILITOT 0.4 09/06/2014   BILITOT 0.5 06/03/2013   ALKPHOS 93 02/19/2016   ALKPHOS 94 06/03/2013   AST 17 02/19/2016   AST 28 06/03/2013   ALT 9 02/19/2016   ALT 21 06/03/2013  .  Micro Results Recent Results (from the past 240 hour(s))  MRSA PCR Screening     Status: None   Collection Time: 04/15/16  9:28 PM  Result Value Ref Range Status   MRSA by PCR NEGATIVE NEGATIVE Final    Comment:        The GeneXpert MRSA Assay (FDA approved for NASAL specimens only), is one component of a comprehensive MRSA colonization surveillance program. It is  not intended to diagnose MRSA infection nor to guide or monitor treatment for MRSA infections.         Code Status Orders        Start     Ordered   04/15/16 1945  Full code  Continuous     04/15/16 1946    Code Status History    Date Active Date Inactive Code Status Order ID Comments User Context   This patient has a current code status but no historical code status.    Advance Directive Documentation   Flowsheet Row Most Recent Value  Type of Advance Directive  Healthcare Power of Attorney, Living will  Pre-existing out of facility DNR order (yellow form or pink MOST form)  No data  "MOST" Form in Place?  No data           Contact information for follow-up providers    MENZ,MICHAEL, MD Follow up in 6 week(s).   Specialty:  Orthopedic Surgery Contact information: [address] [facility] [place] [address] [phone]            Contact information for after-discharge care    Destination    [facility] Follow up.   Specialty:  Skilled Nursing Facility Contact information: [address] [place] [address] [phone]                  Discharge Medications   Allergies as of 04/18/2016      Reactions   Sulfamethoxazole-trimethoprim Nausea Only, Other (See Comments)   Baclofen    Nsaids    Avoids due to liver.      Medication List    STOP taking these medications   clindamycin 300 MG capsule Commonly known as:  CLEOCIN   lisinopril 2.5 MG tablet Commonly known as:  PRINIVIL,ZESTRIL     TAKE these medications   acetaminophen 500 MG tablet Commonly known as:  TYLENOL Take 2 tablets (1,000 mg total) by mouth every 6 (six) hours.   aspirin EC 81 MG tablet Take 1 tablet (81 mg total) by mouth daily.   clonazePAM 0.5 MG tablet Commonly known as:  KLONOPIN Take 1 tablet (0.5 mg total) by mouth 3 (three) times daily as needed for anxiety.   diclofenac sodium 1 %  Gel Commonly known as:  VOLTAREN Apply 2 g topically 4 (four) times daily.   docusate sodium 100 MG capsule Commonly known as:  COLACE Take 1 capsule (100 mg total) by mouth 2 (two) times daily.   doxazosin 1  MG tablet Commonly known as:  CARDURA TAKE ONE TABLET BY MOUTH ONCE DAILY   enoxaparin 40 MG/0.4ML injection Commonly known as:  LOVENOX Inject 0.4 mLs (40 mg total) into the skin daily.   isosorbide mononitrate 30 MG 24 hr tablet Commonly known as:  IMDUR Take 1 tablet (30 mg total) by mouth daily.   metoprolol tartrate 25 MG tablet Commonly known as:  LOPRESSOR Take 0.5 tablets (12.5 mg total) by mouth 2 (two) times daily. What changed:  how much to take   nitroGLYCERIN 0.4 MG SL tablet Commonly known as:  NITROSTAT Place 0.4 mg under the tongue every 5 (five) minutes as needed for chest pain.   ondansetron 4 MG tablet Commonly known as:  ZOFRAN Take 1 tablet (4 mg total) by mouth every 6 (six) hours as needed for nausea.   oxyCODONE 5 MG immediate release tablet Commonly known as:  Oxy IR/ROXICODONE Take 1-2 tablets (5-10 mg total) by mouth every 3 (three) hours as needed for breakthrough pain.   pantoprazole 40 MG tablet Commonly known as:  PROTONIX Take 1 tablet (40 mg total) by mouth daily.   polyethylene glycol packet Commonly known as:  MIRALAX / GLYCOLAX Take 17 g by mouth daily.   pravastatin 40 MG tablet Commonly known as:  PRAVACHOL TAKE ONE TABLET BY MOUTH ONCE DAILY   PRESERVISION/LUTEIN PO Take by mouth 2 (two) times daily.   sertraline 100 MG tablet Commonly known as:  ZOLOFT Take 1.5 tablets (150 mg total) by mouth at bedtime.            Durable Medical Equipment        Start     Ordered   04/16/16 1743  DME 3 n 1  Once     04/16/16 1742         Total Time in preparing paper work, data evaluation and todays exam - 35 minutes  Dustin Flock M.D on 04/18/2016 at 10:47 AM  [facility]   Office   [phone]

## 2016-04-18 NOTE — Progress Notes (Signed)
EMS called for report

## 2016-04-18 NOTE — Progress Notes (Signed)
Physical Therapy Treatment Patient Details Name: [name] MRN: [id number] DOB: 1923/03/16 Today's Date: 04/18/2016    History of Present Illness Pt. is a 83 y.o. male who was admitted to [facility] with a right anterior approach hemiartroplasty repair of a femoral neck fracture. pt. PMHx includes: Lymphoblastic Lymphoma, Multiple Myeloma, Anxiety, BPH, Carotid Stenosis, [name], Dyslipidemia, Bells Palsy and Pleural Effusion.    PT Comments    Pt in bed, incontinent of urine.  Pt aware but did not want to bother staff.  Educated on skin breakdown and need to call staff when wet.  Participated in exercises as described below.  To edge of bed with mod a x 2.  Sitting EOB with min guard x 2.  Stood with min a x 2 and was able to to transfer to recliner at bedside with overall improved mobility skills today.  Once sitting pt with c/o dizziness but relieved with feet elevated and short rest.  He continues to require +2 assist for mobility for pt and staff safety.   Follow Up Recommendations  SNF     Equipment Recommendations  None recommended by PT    Recommendations for Other Services       Precautions / Restrictions Precautions Precautions: Fall Restrictions Weight Bearing Restrictions: Yes RLE Weight Bearing: Weight bearing as tolerated    Mobility  Bed Mobility Overal bed mobility: Needs Assistance Bed Mobility: Sit to Supine     Supine to sit: Mod assist Sit to supine: Mod assist;+2 for physical assistance   General bed mobility comments: assist for bringing legs back in bed as well as scooting up towards [place].  Transfers Overall transfer level: Needs assistance Equipment used: Rolling walker (2 wheeled) Transfers: Sit to/from Stand Sit to Stand: Min assist;+2 physical assistance            Ambulation/Gait   Ambulation Distance (Feet): 3 Feet Assistive device: Rolling walker (2 wheeled) Gait Pattern/deviations: Step-to pattern   Gait velocity interpretation: <1.8  ft/sec, indicative of risk for recurrent falls General Gait Details: shuffling gait, less tremors today.  Does not sit until instructed today.  Overall improved transfer.   Stairs            Wheelchair Mobility    Modified Rankin (Stroke Patients Only)       Balance Overall balance assessment: History of Falls;Needs assistance Sitting-balance support: Feet supported;Bilateral upper extremity supported Sitting balance-Leahy Scale: Fair   Postural control: Posterior lean Standing balance support: Bilateral upper extremity supported Standing balance-Leahy Scale: Fair                      Cognition Arousal/Alertness: Awake/alert Behavior During Therapy: WFL for tasks assessed/performed Overall Cognitive Status: Within Functional Limits for tasks assessed                      Exercises Total Joint Exercises Ankle Circles/Pumps: AROM;Both;10 reps;Supine Quad Sets: AROM;Right;10 reps Gluteal Sets: AROM;Supine;Both;10 reps Towel Squeeze: AROM;Supine;Both;10 reps Short Arc Quad: AROM;Supine;Right;10 reps Heel Slides: Supine;AAROM;Right;10 reps Hip ABduction/ADduction: AAROM;Supine;Right;10 reps Straight Leg Raises: AAROM;Supine;Right;10 reps    General Comments        Pertinent Vitals/Pain Pain Assessment: 0-10 Pain Score: 5  Pain Location: Right Hip Pain Descriptors / Indicators: Operative site guarding;Discomfort;Dull Pain Intervention(s): Limited activity within patient's tolerance    Home Living                      Prior Function  PT Goals (current goals can now be found in the care plan section) Progress towards PT goals: Progressing toward goals    Frequency    BID      PT Plan Current plan remains appropriate    Co-evaluation             End of Session Equipment Utilized During Treatment: Gait belt Activity Tolerance: Patient tolerated treatment well Patient left: in chair;with chair alarm set;with  call bell/phone within reach;with family/visitor present Nurse Communication: Mobility status Pain - Right/Left: Right Pain - part of body: Hip     Time: 1103-1594 PT Time Calculation (min) (ACUTE ONLY): 24 min  Charges:  $Therapeutic Exercise: 8-22 mins $Therapeutic Activity: 8-22 mins                    G Codes:       Chesley Noon 2016-04-29, 9:26 AM

## 2016-04-18 NOTE — Progress Notes (Signed)
Pt discharged via EMS to [facility]. Belongings sent with wife. Pt is alert and oriented. Denies pain. Iv removed prior to discharge

## 2016-04-20 LAB — SURGICAL PATHOLOGY

## 2016-04-29 ENCOUNTER — Non-Acute Institutional Stay (SKILLED_NURSING_FACILITY): Payer: Medicare Other | Admitting: Gerontology

## 2016-04-29 DIAGNOSIS — S72001D Fracture of unspecified part of neck of right femur, subsequent encounter for closed fracture with routine healing: Secondary | ICD-10-CM

## 2016-04-29 DIAGNOSIS — G8918 Other acute postprocedural pain: Secondary | ICD-10-CM | POA: Diagnosis not present

## 2016-05-12 ENCOUNTER — Other Ambulatory Visit
Admission: RE | Admit: 2016-05-12 | Discharge: 2016-05-12 | Disposition: A | Discharge status: Home / Self Care | Payer: Medicare Other | Source: Ambulatory Visit | Attending: Gerontology | Admitting: Gerontology

## 2016-05-12 DIAGNOSIS — N183 Chronic kidney disease, stage 3 (moderate): Secondary | ICD-10-CM | POA: Insufficient documentation

## 2016-05-12 LAB — COMPREHENSIVE METABOLIC PANEL
ALT: 10 U/L — ABNORMAL LOW (ref 17–63)
AST: 22 U/L (ref 15–41)
Albumin: 3.5 g/dL (ref 3.5–5.0)
Alkaline Phosphatase: 115 U/L (ref 38–126)
Anion gap: 7 (ref 5–15)
BUN: 20 mg/dL (ref 6–20)
CO2: 28 mmol/L (ref 22–32)
Calcium: 9.3 mg/dL (ref 8.9–10.3)
Chloride: 99 mmol/L — ABNORMAL LOW (ref 101–111)
Creatinine, Ser: 1.03 mg/dL (ref 0.61–1.24)
GFR calc Af Amer: >60 mL/min (ref >60)
GFR calc non Af Amer: >60 mL/min (ref >60)
Glucose, Bld: 117 mg/dL — ABNORMAL HIGH (ref 65–99)
Potassium: 4.7 mmol/L (ref 3.5–5.1)
Sodium: 134 mmol/L — ABNORMAL LOW (ref 135–145)
Total Bilirubin: 0.6 mg/dL (ref 0.3–1.2)
Total Protein: 8 g/dL (ref 6.5–8.1)

## 2016-05-12 LAB — CBC WITH DIFFERENTIAL/PLATELET
Basophils Absolute: 0 10*3/uL (ref 0–0.1)
Basophils Relative: 1 %
Eosinophils Absolute: 0.1 10*3/uL (ref 0–0.7)
Eosinophils Relative: 1 %
HCT: 29 % — ABNORMAL LOW (ref 40.0–52.0)
Hemoglobin: 9.6 g/dL — ABNORMAL LOW (ref 13.0–18.0)
Lymphocytes Relative: 7 %
Lymphs Abs: 0.5 10*3/uL — ABNORMAL LOW (ref 1.0–3.6)
MCH: 28.9 pg (ref 26.0–34.0)
MCHC: 33 g/dL (ref 32.0–36.0)
MCV: 87.5 fL (ref 80.0–100.0)
Monocytes Absolute: 0.5 10*3/uL (ref 0.2–1.0)
Monocytes Relative: 7 %
Neutro Abs: 6.4 10*3/uL (ref 1.4–6.5)
Neutrophils Relative %: 84 %
Platelets: 233 10*3/uL (ref 150–440)
RBC: 3.32 MIL/uL — ABNORMAL LOW (ref 4.40–5.90)
RDW: 15 % — ABNORMAL HIGH (ref 11.5–14.5)
WBC: 7.6 10*3/uL (ref 3.8–10.6)

## 2016-05-12 NOTE — Progress Notes (Signed)
Location:      Place of Service:  SNF (31) Provider:  Lorenso Quarry, NP-C  Brayton El, MD  Patient Care Team: Ellyn Hack, MD as PCP - General (Family Medicine) Iran Ouch, MD as Consulting Physician (Cardiology)  Extended Emergency Contact Information Primary Emergency Contact: Judeen Hammans Address: [place] Home Phone: [phone] Relation: Daughter Secondary Emergency Contact: Crandell,Gregory  [place] Home Phone: [phone] Relation: Son  Code Status:  Full Goals of care: Advanced Directive information Advanced Directives 04/15/2016  Does Patient Have a Medical Advance Directive? Yes  Type of Estate agent of [place];Living will  Does patient want to make changes to medical advance directive? No - Patient declined  Copy of Healthcare Power of Attorney in Chart? No - copy requested  Would patient like information on creating a medical advance directive? -     Chief Complaint  Patient presents with  . Follow-up    HPI:  Pt is a 83 y.o. male seen today for Follow-up visit following hospitalization at [facility] for closed right hip fracture with surgical intervention and for evaluation of pain. Patient has been undergoing physical therapy and occupational therapy while in facility. She has been tolerating this well. He is now reporting minimal pain. At times, the pain will get as high as 7-8 out of 10 but then will decrease to approximately 3 out of 10 with pain medications. Patient reports the pain is well controlled as long as he takes medications before therapy. Patient reports appetite is good. Having regular BMs. Patient denies nausea, vomiting, diarrhea, fever, chills, chest pain, shortness of breath, headache, abdominal pain, dizziness. Vital signs stable. No other complaints..     Past Medical History:  Diagnosis Date  . Anxiety   . BPH (benign prostatic hypertrophy)   .  Carotid stenosis   . Chronic back pain   . Coronary artery disease   . Dyslipidemia   . GERD (gastroesophageal reflux disease)   . Hx of [name]   . Hyperlipidemia   . Hypertension   . Inguinal hernia   . Lymphoblastic lymphoma (HCC)   . Major depression   . Multiple myeloma (HCC)   . Pleural effusion    left  . Sleep apnea    Past Surgical History:  Procedure Laterality Date  . ANTERIOR APPROACH HEMI HIP ARTHROPLASTY Right 04/16/2016   Procedure: ANTERIOR APPROACH HEMI HIP ARTHROPLASTY;  Surgeon: Kennedy Bucker, MD;  Location: [facility] ORS;  Service: Orthopedics;  Laterality: Right;  . APPENDECTOMY    . CARDIAC CATHETERIZATION  2002  . CARDIAC CATHETERIZATION  06/2013  . COLONOSCOPY    . FEMUR FRACTURE SURGERY Left    with rod  . HIP FRACTURE SURGERY    . JOINT REPLACEMENT     right partial knee replacement    Allergies  Allergen Reactions  . Sulfamethoxazole-Trimethoprim Nausea Only and Other (See Comments)  . Baclofen   . Nsaids     Avoids due to liver.    Allergies as of 04/29/2016      Reactions   Sulfamethoxazole-trimethoprim Nausea Only, Other (See Comments)   Baclofen    Nsaids    Avoids due to liver.      Medication List       Accurate as of 04/29/16 11:59 PM. Always use your most recent med list.          acetaminophen 500 MG tablet  Commonly known as:  TYLENOL Take 2 tablets (1,000 mg total) by mouth every 6 (six) hours.   aspirin EC 81 MG tablet Take 1 tablet (81 mg total) by mouth daily.   clonazePAM 0.5 MG tablet Commonly known as:  KLONOPIN Take 1 tablet (0.5 mg total) by mouth 3 (three) times daily as needed for anxiety.   diclofenac sodium 1 % Gel Commonly known as:  VOLTAREN Apply 2 g topically 4 (four) times daily.   docusate sodium 100 MG capsule Commonly known as:  COLACE Take 1 capsule (100 mg total) by mouth 2 (two) times daily.   doxazosin 1 MG tablet Commonly known as:  CARDURA TAKE ONE TABLET BY MOUTH ONCE DAILY     enoxaparin 40 MG/0.4ML injection Commonly known as:  LOVENOX Inject 0.4 mLs (40 mg total) into the skin daily.   isosorbide mononitrate 30 MG 24 hr tablet Commonly known as:  IMDUR Take 1 tablet (30 mg total) by mouth daily.   metoprolol tartrate 25 MG tablet Commonly known as:  LOPRESSOR Take 0.5 tablets (12.5 mg total) by mouth 2 (two) times daily.   nitroGLYCERIN 0.4 MG SL tablet Commonly known as:  NITROSTAT Place 0.4 mg under the tongue every 5 (five) minutes as needed for chest pain.   ondansetron 4 MG tablet Commonly known as:  ZOFRAN Take 1 tablet (4 mg total) by mouth every 6 (six) hours as needed for nausea.   oxyCODONE 5 MG immediate release tablet Commonly known as:  Oxy IR/ROXICODONE Take 1-2 tablets (5-10 mg total) by mouth every 3 (three) hours as needed for breakthrough pain.   pantoprazole 40 MG tablet Commonly known as:  PROTONIX Take 1 tablet (40 mg total) by mouth daily.   polyethylene glycol packet Commonly known as:  MIRALAX / GLYCOLAX Take 17 g by mouth daily.   pravastatin 40 MG tablet Commonly known as:  PRAVACHOL TAKE ONE TABLET BY MOUTH ONCE DAILY   PRESERVISION/LUTEIN PO Take by mouth 2 (two) times daily.   sertraline 100 MG tablet Commonly known as:  ZOLOFT Take 1.5 tablets (150 mg total) by mouth at bedtime.       Review of Systems  Constitutional: Negative for activity change, appetite change, chills, diaphoresis and fever.  HENT: Negative for congestion, sneezing, sore throat, trouble swallowing and voice change.   Eyes: Negative for pain, redness and visual disturbance.  Respiratory: Negative for apnea, cough, choking, chest tightness, shortness of breath and wheezing.   Cardiovascular: Negative for chest pain, palpitations and leg swelling.  Gastrointestinal: Negative for abdominal distention, abdominal pain, constipation, diarrhea and nausea.  Genitourinary: Negative for difficulty urinating, dysuria, frequency and urgency.   Musculoskeletal: Positive for arthralgias (typical arthritis). Negative for back pain, gait problem and myalgias.  Skin: Positive for wound. Negative for color change, pallor and rash.  Neurological: Negative for dizziness, tremors, syncope, speech difficulty, weakness, numbness and headaches.  Psychiatric/Behavioral: Negative for agitation and behavioral problems.  All other systems reviewed and are negative.   Immunization History  Administered Date(s) Administered  . Influenza, High Dose Seasonal PF 10/24/2014  . Influenza-Unspecified 11/26/2015  . Tdap 11/28/2014, 04/15/2016   Pertinent  Health Maintenance Due  Topic Date Due  . PNA vac Low Risk Adult (1 of 2 - PCV13) 10/24/1990  . INFLUENZA VACCINE  Completed   Fall Risk  02/19/2016 10/14/2015 09/12/2015 07/01/2015 05/30/2015  Falls in the past year? No No No Yes No  Number falls in past yr: - - - 2 or  more -  Injury with Fall? - - - Yes -  Risk Factor Category  - - - High Fall Risk -  Risk for fall due to : - - - Impaired balance/gait;Impaired mobility -  Follow up - - - Falls evaluation completed -   Functional Status Survey:    Vitals:   04/29/16 0400  BP: (!) 117/50  Pulse: 79  Resp: 20  Temp: 97.8 F (36.6 C)  SpO2: 91%   There is no height or weight on file to calculate BMI. Physical Exam  Constitutional: He is oriented to person, place, and time. Vital signs are normal. He appears well-developed and well-nourished. He is active and cooperative. He does not appear ill. No distress.  HENT:  Head: Normocephalic and atraumatic.  Mouth/Throat: Uvula is midline, oropharynx is clear and moist and mucous membranes are normal. Mucous membranes are not pale, not dry and not cyanotic.  Eyes: Conjunctivae, EOM and lids are normal. Pupils are equal, round, and reactive to light.  Neck: Trachea normal, normal range of motion and full passive range of motion without pain. Neck supple. No JVD present. No tracheal deviation, no  edema and no erythema present. No thyromegaly present.  Cardiovascular: Normal rate, regular rhythm, normal heart sounds, intact distal pulses and normal pulses.  Exam reveals no gallop, no distant heart sounds and no friction rub.   No murmur heard. Pulmonary/Chest: Effort normal and breath sounds normal. No accessory muscle usage. No respiratory distress. He has no wheezes. He has no rales. He exhibits no tenderness.  Abdominal: Normal appearance and bowel sounds are normal. He exhibits no distension and no ascites. There is no tenderness.  Musculoskeletal: He exhibits no edema or tenderness.       Right hip: He exhibits decreased range of motion, decreased strength and laceration (incision).  Expected osteoarthritis, stiffness; calves soft, supple. Negative Homans sign.  Neurological: He is alert and oriented to person, place, and time. He has normal strength.  Skin: Skin is warm and dry. Laceration (incision to right hip) noted. No rash noted. He is not diaphoretic. No cyanosis or erythema. No pallor. Nails show no clubbing.  Psychiatric: He has a normal mood and affect. His speech is normal and behavior is normal. Judgment and thought content normal. Cognition and memory are normal.  Nursing note and vitals reviewed.   Labs reviewed:  Recent Labs  04/16/16 0330 04/17/16 0439 04/18/16 0319  NA 136 137 137  K 4.4 4.5 4.1  CL 106 106 108  CO2 '23 22 23  '$ GLUCOSE 94 136* 125*  BUN 27* 27* 18  CREATININE 1.44* 1.47* 1.14  CALCIUM 8.7* 8.3* 8.2*    Recent Labs  02/19/16 1030  AST 17  ALT 9  ALKPHOS 93  BILITOT 0.5  PROT 7.7  ALBUMIN 3.7    Recent Labs  04/16/16 0330 04/17/16 0439 04/18/16 0319  WBC 12.2* 12.4* 12.7*  NEUTROABS  --  10.5*  --   HGB 10.6* 8.3* 8.1*  HCT 31.2* 24.3* 24.2*  MCV 89.4 88.5 90.0  PLT 147* 133* 124*   Lab Results  Component Value Date   TSH 0.267 (L) 12/14/2013   Lab Results  Component Value Date   HGBA1C 5.7 12/14/2013   Lab  Results  Component Value Date   CHOL 68 02/19/2016   HDL 20 (L) 02/19/2016   LDLCALC 23 02/19/2016   TRIG 123 02/19/2016   CHOLHDL 3.4 02/19/2016    Significant Diagnostic Results in last 30 days:  Dg Chest 1 View  Result Date: 04/15/2016 CLINICAL DATA:  Right hip fracture. EXAM: CHEST 1 VIEW COMPARISON:  Radiographs and CT 02/19/2016, chest radiograph 12/15/2013 FINDINGS: Partially loculated left pleural effusion and left basilar opacity are unchanged from comparison exams. The heart size and mediastinal contours are unchanged, left heart border obscured by effusion and basilar changes. There is atherosclerosis of the thoracic aorta. No pneumothorax. Right lung is clear. No acute rib fractures are seen. IMPRESSION: Stable partially loculated left pleural effusion and basilar opacity, chronic. No acute abnormality. Thoracic aortic atherosclerosis. Electronically Signed   By: Jeb Levering M.D.   On: 04/15/2016 18:40   Dg Hip Operative Unilat W Or W/o Pelvis Right  Result Date: 04/16/2016 CLINICAL DATA:  Right hip hemiarthroplasty EXAM: OPERATIVE right HIP (WITH PELVIS IF PERFORMED) 2 VIEWS TECHNIQUE: Fluoroscopic spot image(s) were submitted for interpretation post-operatively. COMPARISON:  Pelvis of right hip films of 04/15/2016 FINDINGS: Two C-arm spot films were returned. These show right hip replacement in good position with no complicating features. The distal stem of the femoral component is not visualized. IMPRESSION: Right total hip replacement with no complicating features. Electronically Signed   By: Ivar Drape M.D.   On: 04/16/2016 16:34   Dg Hip Unilat W Or W/o Pelvis 2-3 Views Right  Result Date: 04/16/2016 CLINICAL DATA:  Status post right hip joint prosthesis placement for fracture. EXAM: DG HIP (WITH OR WITHOUT PELVIS) 2-3V RIGHT COMPARISON:  Pre operative study of today's date FINDINGS: The patient has undergone right hip joint prosthesis placement. Radiographic positioning of  the prosthetic components is good. The interface with the native bone appears normal. Two surgical drain lines are present as well as skin staples. IMPRESSION: No immediate postprocedure complication following right hip joint prosthesis placement. Electronically Signed   By: David  Martinique M.D.   On: 04/16/2016 17:18   Dg Hip Unilat W Or Wo Pelvis 2-3 Views Right  Result Date: 04/15/2016 CLINICAL DATA:  Patient fell on right hip.  Right hip pain. EXAM: DG HIP (WITH OR WITHOUT PELVIS) 2-3V RIGHT COMPARISON:  None. FINDINGS: Bones are diffusely demineralized. Frontal pelvis with AP and frog-leg lateral views of the right hip show a right femoral neck fracture. Assessment is somewhat limited by superimposition of bony anatomy on all projections, but fracture line likely transcervical to subcapital in position. Bony over riding and varus angulation evident. Patient is status post left hip replacement. IMPRESSION: Right femoral neck fracture, likely subcapital to transcervical position although assessment somewhat limited by superimposition of bony anatomy. Electronically Signed   By: Misty [name] M.D.   On: 04/15/2016 18:38    Assessment/Plan 1. Closed right hip fracture, with routine healing, subsequent encounter  Continue Lovenox 40 mg subcutaneous daily 2 more days  Aspirin 81 mg by mouth daily  Continue PT/OT  Pain control as listed below  Follow-up with orthopedics as instructed  2. Pain, acute postoperative Oxycodone 5 mg po TID scheduled Oxycodone 5 mg po Q 3 hours prn Continue complementary therapies including: PT/OT Restorative Nursing Ice pack to site QID and prn Diversional activities Repositioning Q2 hours and prn Scheduled Tylenol 650 mg po QID   Family/ staff Communication:   Total Time:  Documentation:  Face to Face:  Family/Phone:   Labs/tests ordered:    Medication list reviewed and assessed for continued appropriateness. Monthly medication orders reviewed  and signed.  Vikki Ports, NP-C Geriatrics [facility] [phone], [place] [address]  Cell Phone (Mon-Fri 8am-5pm):  [phone] On Call:  [phone] & follow prompts after 5pm & weekends Office Phone:  [phone] Office Fax:  [phone]

## 2016-05-13 ENCOUNTER — Non-Acute Institutional Stay (SKILLED_NURSING_FACILITY): Payer: Medicare Other | Admitting: Gerontology

## 2016-05-13 DIAGNOSIS — G8918 Other acute postprocedural pain: Secondary | ICD-10-CM | POA: Diagnosis not present

## 2016-05-13 DIAGNOSIS — S72001D Fracture of unspecified part of neck of right femur, subsequent encounter for closed fracture with routine healing: Secondary | ICD-10-CM

## 2016-05-17 ENCOUNTER — Encounter
Admission: RE | Admit: 2016-05-17 | Discharge: 2016-05-17 | Disposition: A | Discharge status: Home / Self Care | Payer: Medicare Other | Source: Ambulatory Visit | Attending: Internal Medicine | Admitting: Internal Medicine

## 2016-05-19 ENCOUNTER — Ambulatory Visit: Payer: Medicare Other | Admitting: Family Medicine

## 2016-05-19 NOTE — Progress Notes (Signed)
Location:      Place of Service:  SNF (31) Provider:  Toni Arthurs, NP-C  Keith Rake, MD  Patient Care Team: Roselee Nova, MD as PCP - General (Family Medicine) Wellington Hampshire, MD as Consulting Physician (Cardiology)  Extended Emergency Contact Information Primary Emergency Contact: Berdine Addison Address: [place] of [place] Phone: [phone] Relation: Daughter Secondary Emergency Contact: Barile,Gregory  [place] Phone: [phone] Relation: Son  Code Status:  Full Goals of care: Advanced Directive information Advanced Directives 04/15/2016  Does Patient Have a Medical Advance Directive? Yes  Type of Paramedic of [place];Living will  Does patient want to make changes to medical advance directive? No - Patient declined  Copy of [place] in Chart? No - copy requested  Would patient like information on creating a medical advance directive? -     Chief Complaint  Patient presents with  . Follow-up    HPI:  Pt is a 83 y.o. male seen today for Follow-up visit following hospitalization at [facility] for closed right hip fracture with surgical intervention and for evaluation of pain. Patient has been undergoing physical therapy and occupational therapy while in facility. He has been tolerating this well. He is now reporting moderate pain. At times, the pain will get as high as 7-8 out of 10 but then will decrease to approximately 3 out of 10 with pain medications. Patient reports the pain is somewhat controlled as long as he takes medications before therapy. Patient reports appetite is good. Having regular BMs. Patient denies nausea, vomiting, diarrhea, fever, chills, chest pain, shortness of breath, headache, abdominal pain, dizziness. Vital signs stable. No other complaints..     Past Medical History:  Diagnosis Date  . Anxiety   . BPH (benign prostatic hypertrophy)   .  Carotid stenosis   . Chronic back pain   . Coronary artery disease   . Dyslipidemia   . GERD (gastroesophageal reflux disease)   . Hx of [name]   . Hyperlipidemia   . Hypertension   . Inguinal hernia   . 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 Major depression   . Multiple myeloma ([place])   . Pleural effusion    left  . Sleep apnea    Past Surgical History:  Procedure Laterality Date  . ANTERIOR APPROACH HEMI HIP ARTHROPLASTY Right 04/16/2016   Procedure: ANTERIOR APPROACH HEMI HIP ARTHROPLASTY;  Surgeon: Hessie Knows, MD;  Location: [facility] ORS;  Service: Orthopedics;  Laterality: Right;  . APPENDECTOMY    . CARDIAC CATHETERIZATION  2002  . CARDIAC CATHETERIZATION  06/2013  . COLONOSCOPY    . FEMUR FRACTURE SURGERY Left    with rod  . HIP FRACTURE SURGERY    . JOINT REPLACEMENT     right partial knee replacement    Allergies  Allergen Reactions  . Sulfamethoxazole-Trimethoprim Nausea Only and Other (See Comments)  . Baclofen   . Nsaids     Avoids due to liver.    Allergies as of 05/13/2016      Reactions   Sulfamethoxazole-trimethoprim Nausea Only, Other (See Comments)   Baclofen    Nsaids    Avoids due to liver.      Medication List       Accurate as of 05/13/16 11:59 PM. Always use your most recent med list.          acetaminophen 500 MG tablet  Commonly known as:  TYLENOL Take 2 tablets (1,000 mg total) by mouth every 6 (six) hours.   aspirin EC 81 MG tablet Take 1 tablet (81 mg total) by mouth daily.   clonazePAM 0.5 MG tablet Commonly known as:  KLONOPIN Take 1 tablet (0.5 mg total) by mouth 3 (three) times daily as needed for anxiety.   diclofenac sodium 1 % Gel Commonly known as:  VOLTAREN Apply 2 g topically 4 (four) times daily.   docusate sodium 100 MG capsule Commonly known as:  COLACE Take 1 capsule (100 mg total) by mouth 2 (two) times daily.   doxazosin 1 MG tablet Commonly known as:  CARDURA TAKE ONE TABLET BY MOUTH ONCE DAILY     enoxaparin 40 MG/0.4ML injection Commonly known as:  LOVENOX Inject 0.4 mLs (40 mg total) into the skin daily.   isosorbide mononitrate 30 MG 24 hr tablet Commonly known as:  IMDUR Take 1 tablet (30 mg total) by mouth daily.   metoprolol tartrate 25 MG tablet Commonly known as:  LOPRESSOR Take 0.5 tablets (12.5 mg total) by mouth 2 (two) times daily.   nitroGLYCERIN 0.4 MG SL tablet Commonly known as:  NITROSTAT Place 0.4 mg under the tongue every 5 (five) minutes as needed for chest pain.   ondansetron 4 MG tablet Commonly known as:  ZOFRAN Take 1 tablet (4 mg total) by mouth every 6 (six) hours as needed for nausea.   oxyCODONE 5 MG immediate release tablet Commonly known as:  Oxy IR/ROXICODONE Take 1-2 tablets (5-10 mg total) by mouth every 3 (three) hours as needed for breakthrough pain.   pantoprazole 40 MG tablet Commonly known as:  PROTONIX Take 1 tablet (40 mg total) by mouth daily.   polyethylene glycol packet Commonly known as:  MIRALAX / GLYCOLAX Take 17 g by mouth daily.   pravastatin 40 MG tablet Commonly known as:  PRAVACHOL TAKE ONE TABLET BY MOUTH ONCE DAILY   PRESERVISION/LUTEIN PO Take by mouth 2 (two) times daily.   sertraline 100 MG tablet Commonly known as:  ZOLOFT Take 1.5 tablets (150 mg total) by mouth at bedtime.       Review of Systems  Constitutional: Negative for activity change, appetite change, chills, diaphoresis and fever.  HENT: Negative for congestion, sneezing, sore throat, trouble swallowing and voice change.   Respiratory: Negative for apnea, cough, choking, chest tightness, shortness of breath and wheezing.   Cardiovascular: Negative for chest pain, palpitations and leg swelling.  Gastrointestinal: Negative for abdominal distention, abdominal pain, constipation, diarrhea and nausea.  Genitourinary: Negative for difficulty urinating, dysuria, frequency and urgency.  Musculoskeletal: Positive for arthralgias (typical  arthritis). Negative for back pain, gait problem and myalgias.  Skin: Positive for wound. Negative for color change, pallor and rash.  Neurological: Negative for dizziness, tremors, syncope, speech difficulty, weakness, numbness and headaches.  Psychiatric/Behavioral: Negative for agitation and behavioral problems.  All other systems reviewed and are negative.   Immunization History  Administered Date(s) Administered  . Influenza, High Dose Seasonal PF 10/24/2014  . Influenza-Unspecified 11/26/2015  . Tdap 11/28/2014, 04/15/2016   Pertinent  Health Maintenance Due  Topic Date Due  . PNA vac Low Risk Adult (1 of 2 - PCV13) 10/24/1990  . INFLUENZA VACCINE  09/16/2016   Fall Risk  02/19/2016 10/14/2015 09/12/2015 07/01/2015 05/30/2015  Falls in the past year? No No No Yes No  Number falls in past yr: - - - 2 or more -  Injury with Fall? - - -  Yes -  Risk Factor Category  - - - High Fall Risk -  Risk for fall due to : - - - Impaired balance/gait;Impaired mobility -  Follow up - - - Falls evaluation completed -   Functional Status Survey:    Vitals:   05/13/16 0520  BP: (!) 157/57  Pulse: 78  Resp: 20  Temp: 97.5 F (36.4 C)  SpO2: 98%   There is no height or weight on file to calculate BMI. Physical Exam  Constitutional: He is oriented to person, place, and time. Vital signs are normal. He appears well-developed and well-nourished. He is active and cooperative. He does not appear ill. No distress.  HENT:  Head: Normocephalic and atraumatic.  Mouth/Throat: Uvula is midline, oropharynx is clear and moist and mucous membranes are normal. Mucous membranes are not pale, not dry and not cyanotic.  Eyes: Conjunctivae, EOM and lids are normal. Pupils are equal, round, and reactive to light.  Neck: Trachea normal, normal range of motion and full passive range of motion without pain. Neck supple. No JVD present. No tracheal deviation, no edema and no erythema present. No thyromegaly present.   Cardiovascular: Normal rate, regular rhythm, normal heart sounds, intact distal pulses and normal pulses.  Exam reveals no gallop, no distant heart sounds and no friction rub.   No murmur heard. Pulmonary/Chest: Effort normal and breath sounds normal. No accessory muscle usage. No respiratory distress. He has no wheezes. He has no rales. He exhibits no tenderness.  Abdominal: Normal appearance and bowel sounds are normal. He exhibits no distension and no ascites. There is no tenderness.  Musculoskeletal: He exhibits no edema or tenderness.       Right hip: He exhibits decreased range of motion, decreased strength and laceration (incision).  Expected osteoarthritis, stiffness; calves soft, supple. Negative Homans sign.  Neurological: He is alert and oriented to person, place, and time. He has normal strength.  Skin: Skin is warm and dry. Laceration (incision to right hip) noted. No rash noted. He is not diaphoretic. No cyanosis or erythema. No pallor. Nails show no clubbing.  Psychiatric: He has a normal mood and affect. His speech is normal and behavior is normal. Judgment and thought content normal. Cognition and memory are normal.  Nursing note and vitals reviewed.   Labs reviewed:  Recent Labs  04/17/16 0439 04/18/16 0319 05/12/16 1119  NA 137 137 134*  K 4.5 4.1 4.7  CL 106 108 99*  CO2 _0 GLUCOSE 136* 125* 117*  BUN 27* 18 20  CREATININE 1.47* 1.14 1.03  CALCIUM 8.3* 8.2* 9.3    Recent Labs  02/19/16 1030 05/12/16 1119  AST 17 22  ALT 9 10*  ALKPHOS 93 115  BILITOT 0.5 0.6  PROT 7.7 8.0  ALBUMIN 3.7 3.5    Recent Labs  04/17/16 0439 04/18/16 0319 05/12/16 1119  WBC 12.4* 12.7* 7.6  NEUTROABS 10.5*  --  6.4  HGB 8.3* 8.1* 9.6*  HCT 24.3* 24.2* 29.0*  MCV 88.5 90.0 87.5  PLT 133* 124* 233   Lab Results  Component Value Date   TSH 0.267 (L) 12/14/2013   Lab Results  Component Value Date   HGBA1C 5.7 12/14/2013   Lab Results  Component Value  Date   CHOL 68 02/19/2016   HDL 20 (L) 02/19/2016   LDLCALC 23 02/19/2016   TRIG 123 02/19/2016   CHOLHDL 3.4 02/19/2016    Significant Diagnostic Results in last 30 days:  No results  found.  Assessment/Plan 1. Closed right hip fracture, with routine healing, subsequent encounter  Course of Lovenox 40 mg completed  Aspirin 81 mg by mouth daily  Continue PT/OT  Pain control as listed below  Follow-up with orthopedics as instructed  2. Pain, acute postoperative Oxycodone 10 mg po QID scheduled Oxycodone 5 mg po Q 3 hours prn Continue complementary therapies including: PT/OT Restorative Nursing Ice pack to site QID and prn Diversional activities Repositioning Q2 hours and prn Scheduled Tylenol 650 mg po QID  Clonazepam 0.5 mg po BID scheduled (for anxiety and pain/relaxant)  Family/ staff Communication:   Total Time:  Documentation:  Face to Face:  Family/Phone:   Labs/tests ordered:    Medication list reviewed and assessed for continued appropriateness. Monthly medication orders reviewed and signed.  Vikki Ports, NP-C Geriatrics [facility] [phone], [place] [address] Cell Phone (Mon-Fri 8am-5pm):  [phone] On Call:  [phone] & follow prompts after 5pm & weekends Office Phone:  [phone] Office Fax:  [phone]

## 2016-05-21 ENCOUNTER — Non-Acute Institutional Stay (SKILLED_NURSING_FACILITY): Payer: Medicare Other | Admitting: Gerontology

## 2016-05-21 DIAGNOSIS — S72001D Fracture of unspecified part of neck of right femur, subsequent encounter for closed fracture with routine healing: Secondary | ICD-10-CM | POA: Diagnosis not present

## 2016-05-21 DIAGNOSIS — G8918 Other acute postprocedural pain: Secondary | ICD-10-CM | POA: Diagnosis not present

## 2016-05-21 DIAGNOSIS — F339 Major depressive disorder, recurrent, unspecified: Secondary | ICD-10-CM | POA: Diagnosis not present

## 2016-05-21 NOTE — Progress Notes (Signed)
Location:      Place of Service:  SNF (31) Provider:  Lorenso Quarry, NP-C  Brayton El, MD  Patient Care Team: Ellyn Hack, MD as PCP - General (Family Medicine) Iran Ouch, MD as Consulting Physician (Cardiology)  Extended Emergency Contact Information Primary Emergency Contact: Judeen Hammans Address: [place] of [place] Home Phone: [phone] Relation: Daughter Secondary Emergency Contact: Pinard,Gregory  [place] Home Phone: [phone] Relation: Son  Code Status:  Full Goals of care: Advanced Directive information Advanced Directives 04/15/2016  Does Patient Have a Medical Advance Directive? Yes  Type of Estate agent of [place];Living will  Does patient want to make changes to medical advance directive? No - Patient declined  Copy of Healthcare Power of Attorney in Chart? No - copy requested  Would patient like information on creating a medical advance directive? -     Chief Complaint  Patient presents with  . Follow-up    HPI:  Pt is a 83 y.o. male seen today for Follow-up visit following hospitalization at [facility] for closed right hip fracture with surgical intervention and for evaluation of pain. Patient has been undergoing physical therapy and occupational therapy while in facility. He has been tolerating this well. He is now reporting moderate pain. At times, the pain will get as high as 7-8 out of 10 but then will decrease to approximately 3 out of 10 with pain medications. Patient reports the pain is somewhat controlled as long as he takes medications before therapy. Patient reports appetite is good. Having regular BMs. Patient denies nausea, vomiting, diarrhea, fever, chills, chest pain, shortness of breath, headache, abdominal pain, dizziness. Pt does continue to report worsening depression. Flat affect. Will adjust antidepressant medications. Vital signs stable. No other complaints..      Past Medical History:  Diagnosis Date  . Anxiety   . BPH (benign prostatic hypertrophy)   . Carotid stenosis   . Chronic back pain   . Coronary artery disease   . Dyslipidemia   . GERD (gastroesophageal reflux disease)   . Hx of [name]   . Hyperlipidemia   . Hypertension   . Inguinal hernia   . Lymphoblastic lymphoma ([facility])   . Major depression   . Multiple myeloma ([facility])   . Pleural effusion    left  . Sleep apnea    Past Surgical History:  Procedure Laterality Date  . ANTERIOR APPROACH HEMI HIP ARTHROPLASTY Right 04/16/2016   Procedure: ANTERIOR APPROACH HEMI HIP ARTHROPLASTY;  Surgeon: Kennedy Bucker, MD;  Location: [facility] ORS;  Service: Orthopedics;  Laterality: Right;  . APPENDECTOMY    . CARDIAC CATHETERIZATION  2002  . CARDIAC CATHETERIZATION  06/2013  . COLONOSCOPY    . FEMUR FRACTURE SURGERY Left    with rod  . HIP FRACTURE SURGERY    . JOINT REPLACEMENT     right partial knee replacement    Allergies  Allergen Reactions  . Sulfamethoxazole-Trimethoprim Nausea Only and Other (See Comments)  . Baclofen   . Nsaids     Avoids due to liver.    Allergies as of 05/21/2016      Reactions   Sulfamethoxazole-trimethoprim Nausea Only, Other (See Comments)   Baclofen    Nsaids    Avoids due to liver.      Medication List       Accurate as of 05/21/16 12:48 PM. Always use your most recent med list.  acetaminophen 500 MG tablet Commonly known as:  TYLENOL Take 2 tablets (1,000 mg total) by mouth every 6 (six) hours.   aspirin EC 81 MG tablet Take 1 tablet (81 mg total) by mouth daily.   clonazePAM 0.5 MG tablet Commonly known as:  KLONOPIN Take 1 tablet (0.5 mg total) by mouth 3 (three) times daily as needed for anxiety.   diclofenac sodium 1 % Gel Commonly known as:  VOLTAREN Apply 2 g topically 4 (four) times daily.   docusate sodium 100 MG capsule Commonly known as:  COLACE Take 1 capsule (100 mg total) by mouth 2 (two) times daily.     doxazosin 1 MG tablet Commonly known as:  CARDURA TAKE ONE TABLET BY MOUTH ONCE DAILY   enoxaparin 40 MG/0.4ML injection Commonly known as:  LOVENOX Inject 0.4 mLs (40 mg total) into the skin daily.   isosorbide mononitrate 30 MG 24 hr tablet Commonly known as:  IMDUR Take 1 tablet (30 mg total) by mouth daily.   metoprolol tartrate 25 MG tablet Commonly known as:  LOPRESSOR Take 0.5 tablets (12.5 mg total) by mouth 2 (two) times daily.   nitroGLYCERIN 0.4 MG SL tablet Commonly known as:  NITROSTAT Place 0.4 mg under the tongue every 5 (five) minutes as needed for chest pain.   ondansetron 4 MG tablet Commonly known as:  ZOFRAN Take 1 tablet (4 mg total) by mouth every 6 (six) hours as needed for nausea.   oxyCODONE 5 MG immediate release tablet Commonly known as:  Oxy IR/ROXICODONE Take 1-2 tablets (5-10 mg total) by mouth every 3 (three) hours as needed for breakthrough pain.   pantoprazole 40 MG tablet Commonly known as:  PROTONIX Take 1 tablet (40 mg total) by mouth daily.   polyethylene glycol packet Commonly known as:  MIRALAX / GLYCOLAX Take 17 g by mouth daily.   pravastatin 40 MG tablet Commonly known as:  PRAVACHOL TAKE ONE TABLET BY MOUTH ONCE DAILY   PRESERVISION/LUTEIN PO Take by mouth 2 (two) times daily.   sertraline 100 MG tablet Commonly known as:  ZOLOFT Take 1.5 tablets (150 mg total) by mouth at bedtime.       Review of Systems  Constitutional: Negative for activity change, appetite change, chills, diaphoresis and fever.  HENT: Negative for congestion, sneezing, sore throat, trouble swallowing and voice change.   Respiratory: Negative for apnea, cough, choking, chest tightness, shortness of breath and wheezing.   Cardiovascular: Negative for chest pain, palpitations and leg swelling.  Gastrointestinal: Negative for abdominal distention, abdominal pain, constipation, diarrhea and nausea.  Genitourinary: Negative for difficulty urinating,  dysuria, frequency and urgency.  Musculoskeletal: Positive for arthralgias (typical arthritis). Negative for back pain, gait problem and myalgias.  Skin: Positive for wound. Negative for color change, pallor and rash.  Neurological: Negative for dizziness, tremors, syncope, speech difficulty, weakness, numbness and headaches.  Psychiatric/Behavioral: Negative for agitation and behavioral problems.  All other systems reviewed and are negative.   Immunization History  Administered Date(s) Administered  . Influenza, High Dose Seasonal PF 10/24/2014  . Influenza-Unspecified 11/26/2015  . Tdap 11/28/2014, 04/15/2016   Pertinent  Health Maintenance Due  Topic Date Due  . PNA vac Low Risk Adult (1 of 2 - PCV13) 10/24/1990  . INFLUENZA VACCINE  09/16/2016   Fall Risk  02/19/2016 10/14/2015 09/12/2015 07/01/2015 05/30/2015  Falls in the past year? No No No Yes No  Number falls in past yr: - - - 2 or more -  Injury with  Fall? - - - Yes -  Risk Factor Category  - - - High Fall Risk -  Risk for fall due to : - - - Impaired balance/gait;Impaired mobility -  Follow up - - - Falls evaluation completed -   Functional Status Survey:    Vitals:   05/20/16 2200  BP: (!) 133/44  Pulse: 78  Resp: 20  Temp: 98.1 F (36.7 C)  SpO2: 96%   There is no height or weight on file to calculate BMI. Physical Exam  Constitutional: He is oriented to person, place, and time. Vital signs are normal. He appears well-developed and well-nourished. He is active and cooperative. He does not appear ill. No distress.  HENT:  Head: Normocephalic and atraumatic.  Mouth/Throat: Uvula is midline, oropharynx is clear and moist and mucous membranes are normal. Mucous membranes are not pale, not dry and not cyanotic.  Eyes: Conjunctivae, EOM and lids are normal. Pupils are equal, round, and reactive to light.  Neck: Trachea normal, normal range of motion and full passive range of motion without pain. Neck supple. No JVD  present. No tracheal deviation, no edema and no erythema present. No thyromegaly present.  Cardiovascular: Normal rate, regular rhythm, normal heart sounds, intact distal pulses and normal pulses.  Exam reveals no gallop, no distant heart sounds and no friction rub.   No murmur heard. Pulmonary/Chest: Effort normal and breath sounds normal. No accessory muscle usage. No respiratory distress. He has no decreased breath sounds. He has no wheezes. He has no rhonchi. He has no rales. He exhibits no tenderness.  Abdominal: Normal appearance and bowel sounds are normal. He exhibits no distension and no ascites. There is no tenderness.  Musculoskeletal: He exhibits no edema or tenderness.       Right hip: He exhibits decreased range of motion, decreased strength and laceration (incision).  Expected osteoarthritis, stiffness; calves soft, supple. Negative Homans sign.  Neurological: He is alert and oriented to person, place, and time. He has normal strength.  Skin: Skin is warm and dry. Laceration (incision to right hip) noted. No rash noted. He is not diaphoretic. No cyanosis or erythema. No pallor. Nails show no clubbing.  Psychiatric: He has a normal mood and affect. His speech is normal and behavior is normal. Judgment and thought content normal. Cognition and memory are normal.  Nursing note and vitals reviewed.   Labs reviewed:  Recent Labs  04/17/16 0439 04/18/16 0319 05/12/16 1119  NA 137 137 134*  K 4.5 4.1 4.7  CL 106 108 99*  CO2 '22 23 28  '$ GLUCOSE 136* 125* 117*  BUN 27* 18 20  CREATININE 1.47* 1.14 1.03  CALCIUM 8.3* 8.2* 9.3    Recent Labs  02/19/16 1030 05/12/16 1119  AST 17 22  ALT 9 10*  ALKPHOS 93 115  BILITOT 0.5 0.6  PROT 7.7 8.0  ALBUMIN 3.7 3.5    Recent Labs  04/17/16 0439 04/18/16 0319 05/12/16 1119  WBC 12.4* 12.7* 7.6  NEUTROABS 10.5*  --  6.4  HGB 8.3* 8.1* 9.6*  HCT 24.3* 24.2* 29.0*  MCV 88.5 90.0 87.5  PLT 133* 124* 233   Lab Results    Component Value Date   TSH 0.267 (L) 12/14/2013   Lab Results  Component Value Date   HGBA1C 5.7 12/14/2013   Lab Results  Component Value Date   CHOL 68 02/19/2016   HDL 20 (L) 02/19/2016   LDLCALC 23 02/19/2016   TRIG 123 02/19/2016   CHOLHDL  3.4 02/19/2016    Significant Diagnostic Results in last 30 days:  No results found.  Assessment/Plan 1. Closed right hip fracture, with routine healing, subsequent encounter  Course of Lovenox 40 mg completed  Aspirin 81 mg by mouth daily  Continue PT/OT  Pain control as listed below  Follow-up with orthopedics as instructed  2. Pain, acute postoperative Continue Oxycodone 10 mg po QID scheduled Continue Oxycodone 5 mg po Q 3 hours prn Continue complementary therapies including: PT/OT Restorative Nursing Ice pack to site QID and prn Diversional activities Repositioning Q2 hours and prn Scheduled Tylenol 650 mg po QID  Clonazepam 0.5 mg po BID scheduled (for anxiety and pain/relaxant)  3. Major Depression  Decrease Zoloft to 75 mg po Q Day x 5 days, then DC  Start Effexor XR '75mg'$  po Q Day x 5 days, then  Increase Effexor to 150 mg po Q Day  Follow up with PCP asap after discharge for medication and symptom management  Family/ staff Communication:   Total Time:  Documentation:  Face to Face:  Family/Phone:   Labs/tests ordered:    Medication list reviewed and assessed for continued appropriateness. Monthly medication orders reviewed and signed.  Vikki Ports, NP-C Geriatrics [facility] [phone], [place] [address] Cell Phone (Mon-Fri 8am-5pm):  [phone] On Call:  [phone] & follow prompts after 5pm & weekends Office Phone:  [phone] Office Fax:  [phone]

## 2016-05-28 ENCOUNTER — Telehealth: Payer: Self-pay | Admitting: Family Medicine

## 2016-05-28 NOTE — Telephone Encounter (Signed)
Caitlin from [facility] requesting an order for frequency for physical therapy 1 week 1; 3 week 5; and 2 week 3 [phone]

## 2016-05-28 NOTE — Telephone Encounter (Signed)
Please confirm with Urban Gibson on physical therapy, patient's hospital discharge summary does not list any instructions for physical therapy

## 2016-06-01 ENCOUNTER — Ambulatory Visit
Admission: RE | Admit: 2016-06-01 | Discharge: 2016-06-01 | Disposition: A | Discharge status: Home / Self Care | Payer: Medicare Other | Source: Ambulatory Visit | Attending: Family Medicine | Admitting: Family Medicine

## 2016-06-01 ENCOUNTER — Encounter: Payer: Self-pay | Admitting: Family Medicine

## 2016-06-01 ENCOUNTER — Telehealth: Payer: Self-pay | Admitting: Family Medicine

## 2016-06-01 ENCOUNTER — Ambulatory Visit (INDEPENDENT_AMBULATORY_CARE_PROVIDER_SITE_OTHER): Payer: Medicare Other | Admitting: Family Medicine

## 2016-06-01 VITALS — BP 135/72 | HR 84 | Temp 97.4°F | Resp 16 | Ht 70.0 in | Wt 184.9 lb

## 2016-06-01 DIAGNOSIS — S59902A Unspecified injury of left elbow, initial encounter: Secondary | ICD-10-CM

## 2016-06-01 DIAGNOSIS — S51012A Laceration without foreign body of left elbow, initial encounter: Secondary | ICD-10-CM

## 2016-06-01 DIAGNOSIS — X58XXXA Exposure to other specified factors, initial encounter: Secondary | ICD-10-CM | POA: Insufficient documentation

## 2016-06-01 DIAGNOSIS — Z8781 Personal history of (healed) traumatic fracture: Secondary | ICD-10-CM | POA: Diagnosis not present

## 2016-06-01 DIAGNOSIS — I2510 Atherosclerotic heart disease of native coronary artery without angina pectoris: Secondary | ICD-10-CM

## 2016-06-01 MED ORDER — DOXYCYCLINE HYCLATE 100 MG PO TABS: 100.0000 mg | ORAL_TABLET | Freq: Two times a day (BID) | ORAL | 0 refills | Status: AC

## 2016-06-01 NOTE — Telephone Encounter (Signed)
[name] from [name] physical therapiest requesting verbal order. States she does not feel he is not safe at home due to a lot of falls resulting in skin tear (bilateral elbow and left knee). Requesting 1 month 1 for social worker [phone] it is okay to leave detailed message of voice message.

## 2016-06-01 NOTE — Progress Notes (Signed)
Name: [name]   MRN: [id number]    DOB: 04/11/1923   Date:06/01/2016       Progress Note  Subjective  Chief Complaint  Chief Complaint  Patient presents with  . Fall    pt fell and hurt elbow and left knee    HPI    Past Medical History:  Diagnosis Date  . Anxiety   . BPH (benign prostatic hypertrophy)   . Carotid stenosis   . Chronic back pain   . Coronary artery disease   . Dyslipidemia   . GERD (gastroesophageal reflux disease)   . Hx of [name]   . Hyperlipidemia   . Hypertension   . Inguinal hernia   . 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 Major depression   . Multiple myeloma ([place])   . Pleural effusion    left  . Sleep apnea     Past Surgical History:  Procedure Laterality Date  . ANTERIOR APPROACH HEMI HIP ARTHROPLASTY Right 04/16/2016   Procedure: ANTERIOR APPROACH HEMI HIP ARTHROPLASTY;  Surgeon: Hessie Knows, MD;  Location: [facility] ORS;  Service: Orthopedics;  Laterality: Right;  . APPENDECTOMY    . CARDIAC CATHETERIZATION  2002  . CARDIAC CATHETERIZATION  06/2013  . COLONOSCOPY    . FEMUR FRACTURE SURGERY Left    with rod  . HIP FRACTURE SURGERY    . JOINT REPLACEMENT     right partial knee replacement    Family History  Problem Relation Age of Onset  . Heart disease Mother   . Stroke Mother   . Diabetes Father   . Dementia Father   . Stroke Sister   . Stroke Brother   . Stroke Sister   . Stroke Sister   . Stroke Brother   . Stroke Brother     Social History   Social History  . Marital status: Married    Spouse name: N/A  . Number of children: N/A  . Years of education: N/A   Occupational History  . Not on file.   Social History Main Topics  . Smoking status: Former Smoker    Packs/day: 2.00    Years: 7.00    Types: Cigarettes    Quit date: 09/17/1953  . Smokeless tobacco: Never Used  . Alcohol use No  . Drug use: No  . Sexual activity: Not Currently   Other Topics Concern  . Not on file   Social History Narrative  . No  narrative on file     Current Outpatient Prescriptions:  .  acetaminophen (TYLENOL) 500 MG tablet, Take 2 tablets (1,000 mg total) by mouth every 6 (six) hours., Disp: 30 tablet, Rfl: 0 .  aspirin EC 81 MG tablet, Take 1 tablet (81 mg total) by mouth daily., Disp: 90 tablet, Rfl: 3 .  clonazePAM (KLONOPIN) 0.5 MG tablet, Take 1 tablet (0.5 mg total) by mouth 3 (three) times daily as needed for anxiety., Disp: 90 tablet, Rfl: 2 .  diclofenac sodium (VOLTAREN) 1 % GEL, Apply 2 g topically 4 (four) times daily., Disp: 1 Tube, Rfl: 0 .  docusate sodium (COLACE) 100 MG capsule, Take 1 capsule (100 mg total) by mouth 2 (two) times daily., Disp: 10 capsule, Rfl: 0 .  doxazosin (CARDURA) 1 MG tablet, TAKE ONE TABLET BY MOUTH ONCE DAILY, Disp: 90 tablet, Rfl: 0 .  isosorbide mononitrate (IMDUR) 30 MG 24 hr tablet, Take 1 tablet (30 mg total) by mouth daily., Disp: 90 tablet, Rfl: 1 .  metoprolol tartrate (  LOPRESSOR) 25 MG tablet, Take 0.5 tablets (12.5 mg total) by mouth 2 (two) times daily., Disp: 180 tablet, Rfl: 0 .  Multiple Vitamins-Minerals (PRESERVISION/LUTEIN PO), Take by mouth 2 (two) times daily., Disp: , Rfl:  .  nitroGLYCERIN (NITROSTAT) 0.4 MG SL tablet, Place 0.4 mg under the tongue every 5 (five) minutes as needed for chest pain., Disp: , Rfl:  .  ondansetron (ZOFRAN) 4 MG tablet, Take 1 tablet (4 mg total) by mouth every 6 (six) hours as needed for nausea., Disp: 20 tablet, Rfl: 0 .  oxyCODONE (OXY IR/ROXICODONE) 5 MG immediate release tablet, Take 1-2 tablets (5-10 mg total) by mouth every 3 (three) hours as needed for breakthrough pain., Disp: 30 tablet, Rfl: 0 .  pantoprazole (PROTONIX) 40 MG tablet, Take 1 tablet (40 mg total) by mouth daily., Disp: 90 tablet, Rfl: 0 .  polyethylene glycol (MIRALAX / GLYCOLAX) packet, Take 17 g by mouth daily., Disp: , Rfl:  .  pravastatin (PRAVACHOL) 40 MG tablet, TAKE ONE TABLET BY MOUTH ONCE DAILY, Disp: 90 tablet, Rfl: 0 .  sertraline (ZOLOFT) 100  MG tablet, Take 1.5 tablets (150 mg total) by mouth at bedtime., Disp: 45 tablet, Rfl: 2 .  enoxaparin (LOVENOX) 40 MG/0.4ML injection, Inject 0.4 mLs (40 mg total) into the skin daily., Disp: 0 Syringe, Rfl:   Allergies  Allergen Reactions  . Sulfamethoxazole-Trimethoprim Nausea Only and Other (See Comments)  . Baclofen   . Nsaids     Avoids due to liver.     ROS    Objective  Vitals:   06/01/16 1042  BP: 135/72  Pulse: 84  Resp: 16  Temp: 97.4 F (36.3 C)  TempSrc: Oral  SpO2: 94%  Weight: 184 lb 14.4 oz (83.9 kg)  Height: 5' 10" (1.778 m)    Physical Exam     Recent Results (from the past 2160 hour(s))  CBC     Status: Abnormal   Collection Time: 04/15/16  6:23 PM  Result Value Ref Range   WBC 17.1 (H) 3.8 - 10.6 K/uL   RBC 3.88 (L) 4.40 - 5.90 MIL/uL   Hemoglobin 11.5 (L) 13.0 - 18.0 g/dL   HCT 35.1 (L) 40.0 - 52.0 %   MCV 90.4 80.0 - 100.0 fL   MCH 29.6 26.0 - 34.0 pg   MCHC 32.8 32.0 - 36.0 g/dL   RDW 14.5 11.5 - 14.5 %   Platelets 175 150 - 440 K/uL  Basic metabolic panel     Status: Abnormal   Collection Time: 04/15/16  6:23 PM  Result Value Ref Range   Sodium 137 135 - 145 mmol/L   Potassium 4.1 3.5 - 5.1 mmol/L   Chloride 104 101 - 111 mmol/L   CO2 23 22 - 32 mmol/L   Glucose, Bld 118 (H) 65 - 99 mg/dL   BUN 31 (H) 6 - 20 mg/dL   Creatinine, Ser 1.45 (H) 0.61 - 1.24 mg/dL   Calcium 9.3 8.9 - 10.3 mg/dL   GFR calc non Af Amer 41 (L) >60 mL/min   GFR calc Af Amer 47 (L) >60 mL/min    Comment: (NOTE) The eGFR has been calculated using the CKD EPI equation. This calculation has not been validated in all clinical situations. eGFR's persistently <60 mL/min signify possible Chronic Kidney Disease.    Anion gap 10 5 - 15  Protime-INR     Status: None   Collection Time: 04/15/16  6:23 PM  Result Value Ref Range   Prothrombin  Time 13.3 11.4 - 15.2 seconds   INR 1.01   APTT     Status: None   Collection Time: 04/15/16  6:23 PM  Result Value  Ref Range   aPTT 33 24 - 36 seconds  Type and screen [place]     Status: None   Collection Time: 04/15/16  6:24 PM  Result Value Ref Range   ABO/RH(D) A NEG    Antibody Screen NEG    Sample Expiration 04/18/2016   MRSA PCR Screening     Status: None   Collection Time: 04/15/16  9:28 PM  Result Value Ref Range   MRSA by PCR NEGATIVE NEGATIVE    Comment:        The GeneXpert MRSA Assay (FDA approved for NASAL specimens only), is one component of a comprehensive MRSA colonization surveillance program. It is not intended to diagnose MRSA infection nor to guide or monitor treatment for MRSA infections.   Urinalysis, Complete w Microscopic     Status: Abnormal   Collection Time: 04/15/16 10:27 PM  Result Value Ref Range   Color, Urine YELLOW (A) YELLOW   APPearance CLEAR (A) CLEAR   Specific Gravity, Urine 1.014 1.005 - 1.030   pH 5.0 5.0 - 8.0   Glucose, UA NEGATIVE NEGATIVE mg/dL   Hgb urine dipstick NEGATIVE NEGATIVE   Bilirubin Urine NEGATIVE NEGATIVE   Ketones, ur NEGATIVE NEGATIVE mg/dL   Protein, ur NEGATIVE NEGATIVE mg/dL   Nitrite NEGATIVE NEGATIVE   Leukocytes, UA NEGATIVE NEGATIVE   RBC / HPF 0-5 0 - 5 RBC/hpf   WBC, UA 0-5 0 - 5 WBC/hpf   Bacteria, UA RARE (A) NONE SEEN   Squamous Epithelial / LPF 0-5 (A) NONE SEEN   Hyaline Casts, UA PRESENT   Basic metabolic panel     Status: Abnormal   Collection Time: 04/16/16  3:30 AM  Result Value Ref Range   Sodium 136 135 - 145 mmol/L   Potassium 4.4 3.5 - 5.1 mmol/L   Chloride 106 101 - 111 mmol/L   CO2 23 22 - 32 mmol/L   Glucose, Bld 94 65 - 99 mg/dL   BUN 27 (H) 6 - 20 mg/dL   Creatinine, Ser 1.44 (H) 0.61 - 1.24 mg/dL   Calcium 8.7 (L) 8.9 - 10.3 mg/dL   GFR calc non Af Amer 41 (L) >60 mL/min   GFR calc Af Amer 48 (L) >60 mL/min    Comment: (NOTE) The eGFR has been calculated using the CKD EPI equation. This calculation has not been validated in all clinical situations. eGFR's  persistently <60 mL/min signify possible Chronic Kidney Disease.    Anion gap 7 5 - 15  CBC     Status: Abnormal   Collection Time: 04/16/16  3:30 AM  Result Value Ref Range   WBC 12.2 (H) 3.8 - 10.6 K/uL   RBC 3.49 (L) 4.40 - 5.90 MIL/uL   Hemoglobin 10.6 (L) 13.0 - 18.0 g/dL   HCT 31.2 (L) 40.0 - 52.0 %   MCV 89.4 80.0 - 100.0 fL   MCH 30.4 26.0 - 34.0 pg   MCHC 34.0 32.0 - 36.0 g/dL   RDW 14.3 11.5 - 14.5 %   Platelets 147 (L) 150 - 440 K/uL  Glucose, capillary     Status: None   Collection Time: 04/16/16  7:33 AM  Result Value Ref Range   Glucose-Capillary 92 65 - 99 mg/dL  Surgical pathology     Status: None  Collection Time: 04/16/16  3:59 PM  Result Value Ref Range   SURGICAL PATHOLOGY      Surgical Pathology CASE: [id number] PATIENT: Jamaurion Villari Surgical Pathology Report     SPECIMEN SUBMITTED: A. Femoral head, right  CLINICAL HISTORY: None provided  PRE-OPERATIVE DIAGNOSIS: Right hip fracture  POST-OPERATIVE DIAGNOSIS: Same as pre-op     DIAGNOSIS: A. RIGHT FEMORAL HEAD; HEMIARTHROPLASTY: - FRAGMENTS OF TRABECULAR BONE AND MARROW STROMA WITH HEMORRHAGE AND REACTIVE CHANGES CONSISTENT WITH PROVIDED CLINICAL HISTORY OF FRACTURE.   GROSS DESCRIPTION:  A. Labeled: right femoral head  Size of specimen:      Head -5.1 x 5.0 cm      Neck -shaggy disrupted, 4.5 x 4.8 cm  Articular surface: focally granular brown  Cut surface: tan to brown  Other findings: none noted  Block summary: 1 - representative section(s), post decalcification 2 - representative section(s), post decalcification    Final Diagnosis performed by Delorse Lek, MD.  Electronically signed 04/20/2016 12:34:51PM    The electronic signature indicates that the nam ed Attending Pathologist has evaluated the specimen  Technical component performed at [facility], [address] [place], [place] [address] Lab: [phone] Dir: Darrick Penna. Evette Doffing, MD  Professional component  performed at [facility], [facility], [place], [place], [place] [address] Lab: [phone] Dir: Dellia Nims. Rubinas, MD    CBC     Status: Abnormal   Collection Time: 04/17/16  4:39 AM  Result Value Ref Range   WBC 12.4 (H) 3.8 - 10.6 K/uL   RBC 2.74 (L) 4.40 - 5.90 MIL/uL   Hemoglobin 8.3 (L) 13.0 - 18.0 g/dL   HCT 24.3 (L) 40.0 - 52.0 %   MCV 88.5 80.0 - 100.0 fL   MCH 30.2 26.0 - 34.0 pg   MCHC 34.1 32.0 - 36.0 g/dL   RDW 14.5 11.5 - 14.5 %   Platelets 133 (L) 150 - 440 K/uL  Basic metabolic panel     Status: Abnormal   Collection Time: 04/17/16  4:39 AM  Result Value Ref Range   Sodium 137 135 - 145 mmol/L   Potassium 4.5 3.5 - 5.1 mmol/L   Chloride 106 101 - 111 mmol/L   CO2 22 22 - 32 mmol/L   Glucose, Bld 136 (H) 65 - 99 mg/dL   BUN 27 (H) 6 - 20 mg/dL   Creatinine, Ser 1.47 (H) 0.61 - 1.24 mg/dL   Calcium 8.3 (L) 8.9 - 10.3 mg/dL   GFR calc non Af Amer 40 (L) >60 mL/min   GFR calc Af Amer 47 (L) >60 mL/min    Comment: (NOTE) The eGFR has been calculated using the CKD EPI equation. This calculation has not been validated in all clinical situations. eGFR's persistently <60 mL/min signify possible Chronic Kidney Disease.    Anion gap 9 5 - 15  Differential     Status: Abnormal   Collection Time: 04/17/16  4:39 AM  Result Value Ref Range   Neutrophils Relative % 84 %   Neutro Abs 10.5 (H) 1.4 - 6.5 K/uL   Lymphocytes Relative 6 %   Lymphs Abs 0.7 (L) 1.0 - 3.6 K/uL   Monocytes Relative 8 %   Monocytes Absolute 1.0 0.2 - 1.0 K/uL   Eosinophils Relative 2 %   Eosinophils Absolute 0.3 0 - 0.7 K/uL   Basophils Relative 0 %   Basophils Absolute 0.0 0 - 0.1 K/uL  Glucose, capillary     Status: Abnormal   Collection Time:  04/17/16  7:43 AM  Result Value Ref Range   Glucose-Capillary 122 (H) 65 - 99 mg/dL  Glucose, capillary     Status: Abnormal   Collection Time: 04/17/16 12:16 PM  Result Value Ref Range   Glucose-Capillary 155 (H) 65 - 99  mg/dL  CBC     Status: Abnormal   Collection Time: 04/18/16  3:19 AM  Result Value Ref Range   WBC 12.7 (H) 3.8 - 10.6 K/uL   RBC 2.69 (L) 4.40 - 5.90 MIL/uL   Hemoglobin 8.1 (L) 13.0 - 18.0 g/dL   HCT 24.2 (L) 40.0 - 52.0 %   MCV 90.0 80.0 - 100.0 fL   MCH 29.9 26.0 - 34.0 pg   MCHC 33.2 32.0 - 36.0 g/dL   RDW 14.3 11.5 - 14.5 %   Platelets 124 (L) 150 - 440 K/uL  Basic metabolic panel     Status: Abnormal   Collection Time: 04/18/16  3:19 AM  Result Value Ref Range   Sodium 137 135 - 145 mmol/L   Potassium 4.1 3.5 - 5.1 mmol/L   Chloride 108 101 - 111 mmol/L   CO2 23 22 - 32 mmol/L   Glucose, Bld 125 (H) 65 - 99 mg/dL   BUN 18 6 - 20 mg/dL   Creatinine, Ser 1.14 0.61 - 1.24 mg/dL   Calcium 8.2 (L) 8.9 - 10.3 mg/dL   GFR calc non Af Amer 55 (L) >60 mL/min   GFR calc Af Amer >60 >60 mL/min    Comment: (NOTE) The eGFR has been calculated using the CKD EPI equation. This calculation has not been validated in all clinical situations. eGFR's persistently <60 mL/min signify possible Chronic Kidney Disease.    Anion gap 6 5 - 15  Glucose, capillary     Status: Abnormal   Collection Time: 04/18/16  7:35 AM  Result Value Ref Range   Glucose-Capillary 121 (H) 65 - 99 mg/dL   Comment 1 Notify RN   Glucose, capillary     Status: Abnormal   Collection Time: 04/18/16 11:07 AM  Result Value Ref Range   Glucose-Capillary 109 (H) 65 - 99 mg/dL   Comment 1 Notify RN   CBC with Differential/Platelet     Status: Abnormal   Collection Time: 05/12/16 11:19 AM  Result Value Ref Range   WBC 7.6 3.8 - 10.6 K/uL   RBC 3.32 (L) 4.40 - 5.90 MIL/uL   Hemoglobin 9.6 (L) 13.0 - 18.0 g/dL   HCT 29.0 (L) 40.0 - 52.0 %   MCV 87.5 80.0 - 100.0 fL   MCH 28.9 26.0 - 34.0 pg   MCHC 33.0 32.0 - 36.0 g/dL   RDW 15.0 (H) 11.5 - 14.5 %   Platelets 233 150 - 440 K/uL   Neutrophils Relative % 84 %   Neutro Abs 6.4 1.4 - 6.5 K/uL   Lymphocytes Relative 7 %   Lymphs Abs 0.5 (L) 1.0 - 3.6 K/uL    Monocytes Relative 7 %   Monocytes Absolute 0.5 0.2 - 1.0 K/uL   Eosinophils Relative 1 %   Eosinophils Absolute 0.1 0 - 0.7 K/uL   Basophils Relative 1 %   Basophils Absolute 0.0 0 - 0.1 K/uL  Comprehensive metabolic panel     Status: Abnormal   Collection Time: 05/12/16 11:19 AM  Result Value Ref Range   Sodium 134 (L) 135 - 145 mmol/L   Potassium 4.7 3.5 - 5.1 mmol/L   Chloride 99 (L) 101 - 111 mmol/L  CO2 28 22 - 32 mmol/L   Glucose, Bld 117 (H) 65 - 99 mg/dL   BUN 20 6 - 20 mg/dL   Creatinine, Ser 1.03 0.61 - 1.24 mg/dL   Calcium 9.3 8.9 - 10.3 mg/dL   Total Protein 8.0 6.5 - 8.1 g/dL   Albumin 3.5 3.5 - 5.0 g/dL   AST 22 15 - 41 U/L   ALT 10 (L) 17 - 63 U/L   Alkaline Phosphatase 115 38 - 126 U/L   Total Bilirubin 0.6 0.3 - 1.2 mg/dL   GFR calc non Af Amer >60 >60 mL/min   GFR calc Af Amer >60 >60 mL/min    Comment: (NOTE) The eGFR has been calculated using the CKD EPI equation. This calculation has not been validated in all clinical situations. eGFR's persistently <60 mL/min signify possible Chronic Kidney Disease.    Anion gap 7 5 - 15     Assessment & Plan  There are no diagnoses linked to this encounter.  Syed Asad A. [facility] 06/01/2016 11:01 AM

## 2016-06-02 ENCOUNTER — Encounter: Payer: Medicare Other | Attending: Internal Medicine | Admitting: Internal Medicine

## 2016-06-02 ENCOUNTER — Telehealth: Payer: Self-pay

## 2016-06-02 DIAGNOSIS — M48061 Spinal stenosis, lumbar region without neurogenic claudication: Secondary | ICD-10-CM | POA: Insufficient documentation

## 2016-06-02 DIAGNOSIS — Z87891 Personal history of nicotine dependence: Secondary | ICD-10-CM | POA: Insufficient documentation

## 2016-06-02 DIAGNOSIS — S41112D Laceration without foreign body of left upper arm, subsequent encounter: Secondary | ICD-10-CM | POA: Insufficient documentation

## 2016-06-02 DIAGNOSIS — I2510 Atherosclerotic heart disease of native coronary artery without angina pectoris: Secondary | ICD-10-CM | POA: Insufficient documentation

## 2016-06-02 DIAGNOSIS — Z886 Allergy status to analgesic agent status: Secondary | ICD-10-CM | POA: Diagnosis not present

## 2016-06-02 DIAGNOSIS — X58XXXD Exposure to other specified factors, subsequent encounter: Secondary | ICD-10-CM | POA: Insufficient documentation

## 2016-06-02 DIAGNOSIS — G510 Bell's palsy: Secondary | ICD-10-CM | POA: Diagnosis not present

## 2016-06-02 DIAGNOSIS — S41111D Laceration without foreign body of right upper arm, subsequent encounter: Secondary | ICD-10-CM | POA: Insufficient documentation

## 2016-06-02 DIAGNOSIS — E785 Hyperlipidemia, unspecified: Secondary | ICD-10-CM | POA: Diagnosis not present

## 2016-06-02 DIAGNOSIS — F419 Anxiety disorder, unspecified: Secondary | ICD-10-CM | POA: Diagnosis not present

## 2016-06-02 DIAGNOSIS — I10 Essential (primary) hypertension: Secondary | ICD-10-CM | POA: Diagnosis not present

## 2016-06-02 DIAGNOSIS — G4730 Sleep apnea, unspecified: Secondary | ICD-10-CM | POA: Diagnosis not present

## 2016-06-02 DIAGNOSIS — K219 Gastro-esophageal reflux disease without esophagitis: Secondary | ICD-10-CM | POA: Diagnosis not present

## 2016-06-02 DIAGNOSIS — N400 Enlarged prostate without lower urinary tract symptoms: Secondary | ICD-10-CM | POA: Diagnosis not present

## 2016-06-02 DIAGNOSIS — I952 Hypotension due to drugs: Secondary | ICD-10-CM | POA: Diagnosis not present

## 2016-06-02 NOTE — Telephone Encounter (Signed)
Returned call and left a voice message for Urban Gibson to call back, will like to discuss in detail the frequency of physical therapy and Education officer, museum.

## 2016-06-02 NOTE — Telephone Encounter (Signed)
Routed note to Dr. Manuella Ghazi to return call to [place] worker from [facility] concerning verbal orders for 1 mo 1 social work for this patient, please return call at [phone]

## 2016-06-02 NOTE — Telephone Encounter (Signed)
Returned call and left voice message for Chong Sicilian regarding this patient, would like to discuss the frequency of physical therapy and Education officer, museum.

## 2016-06-03 NOTE — Progress Notes (Signed)
Name: [name]   MRN: [id number]    DOB: [birth year]   Date:06/03/2016       Progress Note   Subjective  Chief Complaint  Chief Complaint  Patient presents with  . Fall    pt fell and hurt elbow and left knee    HPI  Patient presents for follow-up after discharge from hospital for a displaced fracture right femoral neck, status post right hip hemi-arthroplasty. [place] was sent to rehabilitation after surgery and now transitioned to his assisted living facility. He is brought in today with his daughter and wife with concerns about injury to the left elbow region after he had a fall. Patient is unable to determine when he fell but his daughter states that he most likely fell at the rehabilitation place. He has a laceration over the left elbow, Route denies any pain over the affected area, has been wrapped in a dressing  Past Medical History:  Diagnosis Date  . Anxiety   . BPH (benign prostatic hypertrophy)   . Carotid stenosis   . Chronic back pain   . Coronary artery disease   . Dyslipidemia   . GERD (gastroesophageal reflux disease)   . Hx of [name]   . Hyperlipidemia   . Hypertension   . Inguinal hernia   . 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 Major depression   . Multiple myeloma ([place])   . Pleural effusion    left  . Sleep apnea     Past Surgical History:  Procedure Laterality Date  . ANTERIOR APPROACH HEMI HIP ARTHROPLASTY Right 04/16/2016   Procedure: ANTERIOR APPROACH HEMI HIP ARTHROPLASTY;  Surgeon: Hessie Knows, MD;  Location: [facility] ORS;  Service: Orthopedics;  Laterality: Right;  . APPENDECTOMY    . CARDIAC CATHETERIZATION  2002  . CARDIAC CATHETERIZATION  06/2013  . COLONOSCOPY    . FEMUR FRACTURE SURGERY Left    with rod  . HIP FRACTURE SURGERY    . JOINT REPLACEMENT     right partial knee replacement    Family History  Problem Relation Age of Onset  . Heart disease Mother   . Stroke Mother   . Diabetes Father   . Dementia Father   . Stroke Sister     . Stroke Brother   . Stroke Sister   . Stroke Sister   . Stroke Brother   . Stroke Brother     Social History   Social History  . Marital status: Married    Spouse name: N/A  . Number of children: N/A  . Years of education: N/A   Occupational History  . Not on file.   Social History Main Topics  . Smoking status: Former Smoker    Packs/day: 2.00    Years: 7.00    Types: Cigarettes    Quit date: 09/17/1953  . Smokeless tobacco: Never Used  . Alcohol use No  . Drug use: No  . Sexual activity: Not Currently   Other Topics Concern  . Not on file   Social History Narrative  . No narrative on file     Current Outpatient Prescriptions:  .  acetaminophen (TYLENOL) 500 MG tablet, Take 2 tablets (1,000 mg total) by mouth every 6 (six) hours., Disp: 30 tablet, Rfl: 0 .  aspirin EC 81 MG tablet, Take 1 tablet (81 mg total) by mouth daily., Disp: 90 tablet, Rfl: 3 .  clonazePAM (KLONOPIN) 0.5 MG tablet, Take 1 tablet (0.5 mg total) by mouth 3 (three) times  daily as needed for anxiety., Disp: 90 tablet, Rfl: 2 .  diclofenac sodium (VOLTAREN) 1 % GEL, Apply 2 g topically 4 (four) times daily., Disp: 1 Tube, Rfl: 0 .  docusate sodium (COLACE) 100 MG capsule, Take 1 capsule (100 mg total) by mouth 2 (two) times daily., Disp: 10 capsule, Rfl: 0 .  doxazosin (CARDURA) 1 MG tablet, TAKE ONE TABLET BY MOUTH ONCE DAILY, Disp: 90 tablet, Rfl: 0 .  isosorbide mononitrate (IMDUR) 30 MG 24 hr tablet, Take 1 tablet (30 mg total) by mouth daily., Disp: 90 tablet, Rfl: 1 .  metoprolol tartrate (LOPRESSOR) 25 MG tablet, Take 0.5 tablets (12.5 mg total) by mouth 2 (two) times daily., Disp: 180 tablet, Rfl: 0 .  Multiple Vitamins-Minerals (PRESERVISION/LUTEIN PO), Take by mouth 2 (two) times daily., Disp: , Rfl:  .  nitroGLYCERIN (NITROSTAT) 0.4 MG SL tablet, Place 0.4 mg under the tongue every 5 (five) minutes as needed for chest pain., Disp: , Rfl:  .  ondansetron (ZOFRAN) 4 MG tablet, Take 1  tablet (4 mg total) by mouth every 6 (six) hours as needed for nausea., Disp: 20 tablet, Rfl: 0 .  oxyCODONE (OXY IR/ROXICODONE) 5 MG immediate release tablet, Take 1-2 tablets (5-10 mg total) by mouth every 3 (three) hours as needed for breakthrough pain., Disp: 30 tablet, Rfl: 0 .  pantoprazole (PROTONIX) 40 MG tablet, Take 1 tablet (40 mg total) by mouth daily., Disp: 90 tablet, Rfl: 0 .  polyethylene glycol (MIRALAX / GLYCOLAX) packet, Take 17 g by mouth daily., Disp: , Rfl:  .  pravastatin (PRAVACHOL) 40 MG tablet, TAKE ONE TABLET BY MOUTH ONCE DAILY, Disp: 90 tablet, Rfl: 0 .  sertraline (ZOLOFT) 100 MG tablet, Take 1.5 tablets (150 mg total) by mouth at bedtime., Disp: 45 tablet, Rfl: 2 .  doxycycline (VIBRA-TABS) 100 MG tablet, Take 1 tablet (100 mg total) by mouth 2 (two) times daily., Disp: 10 tablet, Rfl: 0 .  enoxaparin (LOVENOX) 40 MG/0.4ML injection, Inject 0.4 mLs (40 mg total) into the skin daily., Disp: 0 Syringe, Rfl:   Allergies  Allergen Reactions  . Sulfamethoxazole-Trimethoprim Nausea Only and Other (See Comments)  . Baclofen   . Nsaids     Avoids due to liver.     ROS    Objective  Vitals:   06/01/16 1042  BP: 135/72  Pulse: 84  Resp: 16  Temp: 97.4 F (36.3 C)  TempSrc: Oral  SpO2: 94%  Weight: 184 lb 14.4 oz (83.9 kg)  Height: 5' 10" (1.778 m)    Physical Exam  Constitutional: He is oriented to person, place, and time and well-developed, well-nourished, and in no distress.  Musculoskeletal:       Arms: 3 separate lacerations over the left arm around the elbow region, the greatest 1 measuring over 3 cm in maximal length, skin has peeled off with fresh blood draining from the lacerations up on removal of the dressing. He has no tenderness to palpation over the affected joint  Neurological: He is alert and oriented to person, place, and time.  Nursing note and vitals reviewed.       Assessment & Plan  1. Laceration of skin of left elbow,  initial encounter After removal of dressing, the affected area was cleansed with normal saline, then topical antibiotics were applied to the laceration, obtain x-rays to rule out acute osseous injury, referral to wound care center as the wound is at risk for infection. - AMB referral to wound care center  2. Injury of left elbow, initial encounter  - DG Elbow Complete Left; Future - doxycycline (VIBRA-TABS) 100 MG tablet; Take 1 tablet (100 mg total) by mouth 2 (two) times daily.  Dispense: 10 tablet; Refill: 0  3. S/P right hip fracture  Hospital discharge summary and notes reviewed   Syed Asad A. [facility] 06/03/2016 5:32 PM

## 2016-06-03 NOTE — Progress Notes (Signed)
SUHAYB, ANZALONE ([id number]) Visit Report for 06/02/2016 Abuse/Suicide Risk Screen Details Patient Name: Gow, Breven L. Date of Service: 06/02/2016 8:00 AM Medical Record Number: [id number] Patient Account Number: [id number] Date of Birth/Sex: [birth year] (83 y.o. Male) Treating RN: Cornell Barman Primary Care Michelina Mexicano: [facility], [facility] Other Clinician: Referring Melanee Cordial: [facility] : [facility], [facility] Treating Javari Bufkin/Extender: Tito Dine in Treatment: 0 Abuse/Suicide Risk Screen Items Answer ABUSE/SUICIDE RISK SCREEN: Has anyone close to you tried to hurt or harm you recentlyo No Do you feel uncomfortable with anyone in your familyo No Has anyone forced you do things that you didnot want to doo No Do you have any thoughts of harming yourselfo No Patient displays signs or symptoms of abuse and/or neglect. No Electronic Signature(s) Signed: 06/02/2016 5:07:37 PM By: Gretta Cool RN, BSN, Kim RN, BSN Entered By: Gretta Cool, RN, BSN, Kim on 06/02/2016 08:33:40 [place], Bassett ([id number]) -------------------------------------------------------------------------------- Activities of Daily Living Details Patient Name: Fristoe, Mcdonald L. Date of Service: 06/02/2016 8:00 AM Medical Record Number: [id number] Patient Account Number: [id number] Date of Birth/Sex: [birth year] (83 y.o. Male) Treating RN: Cornell Barman Primary Care Kazmir Oki: [facility], [place]ossie Der Other Clinician: Referring Latrell Reitan: [facility], [facility] Treating Errin Whitelaw/Extender: Tito Dine in Treatment: 0 Activities of Daily Living Items Answer Activities of Daily Living (Please select one for each item) Drive Automobile Not Able Take Medications Need Assistance Use Telephone Need Assistance Care for Appearance Need Assistance Use Toilet Need Assistance Bath / Shower Need Assistance Dress Self Need Assistance Feed Self Need Assistance Walk Need Assistance Get In / Out Bed Need Assistance Housework Need Assistance Prepare Meals Need Assistance Handle Money Need  Assistance Shop for Self Need Assistance Electronic Signature(s) Signed: 06/02/2016 5:07:37 PM By: Gretta Cool, RN, BSN, Kim RN, BSN Entered By: Gretta Cool, RN, BSN, Kim on 06/02/2016 08:34:02 [place], [place] Carlean Jews ([id number]) -------------------------------------------------------------------------------- Education Assessment Details Patient Name: Ogborn, Hyatt L. Date of Service: 06/02/2016 8:00 AM Medical Record Number: [id number] Patient Account Number: [id number] Date of Birth/Sex: 09-29-1923 (83 y.o. Male) Treating RN: Cornell Barman Primary Care Dalton Mille: [facility], Dossie Der Other Clinician: Referring Corion Sherrod: [facility], [facility] Treating Isak Sotomayor/Extender: Tito Dine in Treatment: 0 Primary Learner Assessed: Patient Learning Preferences/Education Level/Primary Language Highest Education Level: College or Above Preferred Language: English Cognitive Barrier Assessment/Beliefs Language Barrier: No Translator Needed: No Memory Deficit: No Emotional Barrier: No Cultural/Religious Beliefs Affecting Medical No Care: Physical Barrier Assessment Impaired Vision: Yes Glasses Impaired Hearing: Yes Hearing Aid Decreased Hand dexterity: No Knowledge/Comprehension Assessment Knowledge Level: High Comprehension Level: High Ability to understand written High instructions: Ability to understand verbal High instructions: Motivation Assessment Anxiety Level: Calm Cooperation: Cooperative Education Importance: Acknowledges Need Interest in Health Problems: Asks Questions Perception: Coherent Willingness to Engage in Self- High Management Activities: Readiness to Engage in Self- High Management Activities: Electronic Signature(s) Signed: 06/02/2016 5:07:37 PM By: Gretta Cool, RN, BSN, Kim RN, BSN [place], Howell Carlean Jews ([id number]) Entered By: Gretta Cool RN, BSN, Kim on 06/02/2016 08:34:37 [place], Jarquavious Carlean Jews ([id number]) -------------------------------------------------------------------------------- Fall Risk Assessment  Details Patient Name: Spike, Ameya L. Date of Service: 06/02/2016 8:00 AM Medical Record Number: [id number] Patient Account Number: [id number] Date of Birth/Sex: 03-28-1923 (83 y.o. Male) Treating RN: Cornell Barman Primary Care Elya Tarquinio: [facility], [facility] Other Clinician: Referring Carma Dwiggins: [facility], [facility] Treating Remmi Armenteros/Extender: Tito Dine in Treatment: 0 Fall Risk Assessment Items Have you had 2 or more falls in the last 12 monthso 0 Yes Have you had any fall that resulted in injury in the last 12 monthso 0 Yes FALL RISK ASSESSMENT: History of falling -  immediate or within 3 months 25 Yes Secondary diagnosis 0 No Ambulatory aid None/bed rest/wheelchair/nurse 0 No Crutches/cane/walker 15 Yes Furniture 0 No IV Access/Saline Lock 0 No Gait/Training Normal/bed rest/immobile 0 No Weak 10 Yes Impaired 0 No Mental Status Oriented to own ability 0 No Electronic Signature(s) Signed: 06/02/2016 5:07:37 PM By: Gretta Cool, RN, BSN, Kim RN, BSN Entered By: Gretta Cool, RN, BSN, Kim on 06/02/2016 08:35:07 [place], [place] ([id number]) -------------------------------------------------------------------------------- Nutrition Risk Assessment Details Patient Name: Poehlman, Delano L. Date of Service: 06/02/2016 8:00 AM Medical Record Number: [id number] Patient Account Number: [id number] Date of Birth/Sex: May 13, 1923 (83 y.o. Male) Treating RN: Cornell Barman Primary Care Katalyn Matin: [facility], [facility] Other Clinician: Referring Ezmae Speers: [facility], [facility] Treating Temika Sutphin/Extender: Tito Dine in Treatment: 0 Height (in): 71 Weight (lbs): 183 Body Mass Index (BMI): 25.5 Nutrition Risk Assessment Items NUTRITION RISK SCREEN: I have an illness or condition that made me change the kind and/or 0 No amount of food I eat I eat fewer than two meals per day 0 No I eat few fruits and vegetables, or milk products 0 No I have three or more drinks of beer, liquor or wine almost every day 0 No I have tooth or mouth  problems that make it hard for me to eat 0 No I don't always have enough money to buy the food I need 0 No I eat alone most of the time 0 No I take three or more different prescribed or over-the-counter drugs a 1 Yes day Without wanting to, I have lost or gained 10 pounds in the last six 0 No months I am not always physically able to shop, cook and/or feed myself 0 No Nutrition Protocols Good Risk Protocol 0 No interventions needed Moderate Risk Protocol Electronic Signature(s) Signed: 06/02/2016 5:07:37 PM By: Gretta Cool, RN, BSN, Kim RN, BSN Entered By: Gretta Cool, RN, BSN, Kim on 06/02/2016 08:35:24

## 2016-06-04 NOTE — Progress Notes (Signed)
TRAVANTI, MCMANUS ([id number]) Visit Report for 06/02/2016 Allergy List Details Patient Name: [name], [name] Date of Service: 06/02/2016 8:00 AM Medical Record Number: [id number] Patient Account Number: [id number] Date of Birth/Sex: 12-May-1923 (83 y.o. Male) Treating RN: Cornell Barman Primary Care Janaria Mccammon: [facility], Dossie Der Other Clinician: Referring Casady Voshell: [facility], SYED Treating Shakira Los/Extender: Ricard Dillon Weeks in Treatment: 0 Allergies Active Allergies baclofen Bactrim NSAIDS (Non-Steroidal Anti-Inflammatory Drug) Allergy Notes Electronic Signature(s) Signed: 06/02/2016 5:07:37 PM By: Gretta Cool, RN, BSN, Kim RN, BSN Entered By: Gretta Cool, RN, BSN, Kim on 06/02/2016 08:30:14 [place], [place] ([id number]) -------------------------------------------------------------------------------- Arrival Information Details Patient Name: [name], [name] Date of Service: 06/02/2016 8:00 AM Medical Record Number: [id number] Patient Account Number: [id number] Date of Birth/Sex: [birth year] (83 y.o. Male) Treating RN: Cornell Barman Primary Care Reyli Schroth: [facility], SYED Other Clinician: Referring Lavell Supple: [facility], SYED Treating Ocie Tino/Extender: Tito Dine in Treatment: 0 Visit Information Patient Arrived: Wheel Chair Arrival Time: 08:14 Accompanied By: daughter and wife Transfer Assistance: Manual Patient Identification Verified: Yes Secondary Verification Process Yes Completed: History Since Last Visit Added or deleted any medications: No Any new allergies or adverse reactions: No Had a fall or experienced change in activities of daily living that may affect risk of falls: No Signs or symptoms of abuse/neglect since last visito No Hospitalized since last visit: No Pain Present Now: No Electronic Signature(s) Signed: 06/02/2016 5:07:37 PM By: Gretta Cool, RN, BSN, Kim RN, BSN Entered By: Gretta Cool, RN, BSN, Kim on 06/02/2016 08:14:37 [place], [place].  ([id number]) -------------------------------------------------------------------------------- Clinic Level of Care Assessment Details Patient Name: Jarmon, Kshawn L. Date of Service: 06/02/2016 8:00 AM Medical Record Number: [id number] Patient Account Number: [id number] Date of Birth/Sex: 06-11-1923 (83 y.o. Male) Treating RN: Cornell Barman Primary Care Iyania Denne: [facility], Dossie Der Other Clinician: Referring Lashonna Rieke: [facility], SYED Treating Daunte Oestreich/Extender: Tito Dine in Treatment: 0 Clinic Level of Care Assessment Items TOOL 2 Quantity Score []  - Use when only an EandM is performed on the INITIAL visit 0 ASSESSMENTS - Nursing Assessment / Reassessment X - General Physical Exam (combine w/ comprehensive assessment (listed just 1 20 below) when performed on new pt. evals) X - Comprehensive Assessment (HX, ROS, Risk Assessments, Wounds Hx, etc.) 1 25 ASSESSMENTS - Wound and Skin Assessment / Reassessment X - Simple Wound Assessment / Reassessment - one wound 1 5 []  - Complex Wound Assessment / Reassessment - multiple wounds 0 []  - Dermatologic / Skin Assessment (not related to wound area) 0 ASSESSMENTS - Ostomy and/or Continence Assessment and Care []  - Incontinence Assessment and Management 0 []  - Ostomy Care Assessment and Management (repouching, etc.) 0 PROCESS - Coordination of Care X - Simple Patient / Family Education for ongoing care 1 15 []  - Complex (extensive) Patient / Family Education for ongoing care 0 X - Staff obtains Programmer, systems, Records, Test Results / Process Orders 1 10 []  - Staff telephones HHA, Nursing Homes / Clarify orders / etc 0 []  - Routine Transfer to another Facility (non-emergent condition) 0 []  - Routine Hospital Admission (non-emergent condition) 0 X - New Admissions / Biomedical engineer / Ordering NPWT, Apligraf, etc. 1 15 []  - Emergency Hospital Admission (emergent condition) 0 X - Simple Discharge Coordination 1 10 Novakovich, Jerimy L. ([id number]) []  -  Complex (extensive) Discharge Coordination 0 PROCESS - Special Needs []  - Pediatric / Minor Patient Management 0 []  - Isolation Patient Management 0 []  - Hearing / Language / Visual special needs 0 []  - Assessment of Community assistance (transportation, D/C planning, etc.) 0 []  -  Additional assistance / Altered mentation 0 []  - Support Surface(s) Assessment (bed, cushion, seat, etc.) 0 INTERVENTIONS - Wound Cleansing / Measurement X - Wound Imaging (photographs - any number of wounds) 1 5 []  - Wound Tracing (instead of photographs) 0 X - Simple Wound Measurement - one wound 1 5 []  - Complex Wound Measurement - multiple wounds 0 X - Simple Wound Cleansing - one wound 1 5 []  - Complex Wound Cleansing - multiple wounds 0 INTERVENTIONS - Wound Dressings X - Small Wound Dressing one or multiple wounds 1 10 []  - Medium Wound Dressing one or multiple wounds 0 []  - Large Wound Dressing one or multiple wounds 0 []  - Application of Medications - injection 0 INTERVENTIONS - Miscellaneous []  - External ear exam 0 []  - Specimen Collection (cultures, biopsies, blood, body fluids, etc.) 0 []  - Specimen(s) / Culture(s) sent or taken to Lab for analysis 0 []  - Patient Transfer (multiple staff / Civil Service fast streamer / Similar devices) 0 []  - Simple Staple / Suture removal (25 or less) 0 []  - Complex Staple / Suture removal (26 or more) 0 Smallman, Hosey L. ([id number]) []  - Hypo / Hyperglycemic Management (close monitor of Blood Glucose) 0 []  - Ankle / Brachial Index (ABI) - do not check if billed separately 0 Has the patient been seen at the hospital within the last three years: Yes Total Score: 125 Level Of Care: New/Established - Level 4 Electronic Signature(s) Signed: 06/02/2016 5:07:37 PM By: Gretta Cool, RN, BSN, Kim RN, BSN Entered By: Gretta Cool, RN, BSN, Kim on 06/02/2016 08:54:05 Nocera, Mykale L. ([id number]) -------------------------------------------------------------------------------- Encounter Discharge  Information Details Patient Name: Guterrez, Franklyn L. Date of Service: 06/02/2016 8:00 AM Medical Record Number: [id number] Patient Account Number: [id number] Date of Birth/Sex: [birth year] (83 y.o. Male) Treating RN: Montey Hora Primary Care Treylan Mcclintock: [facility], SYED Other Clinician: Referring Ski Polich: [facility], SYED Treating Naoma Boxell/Extender: Tito Dine in Treatment: 0 Encounter Discharge Information Items Discharge Pain Level: 0 Discharge Condition: Stable Ambulatory Status: Wheelchair Discharge Destination: Home Transportation: Private Auto daughter and Accompanied By: wife Schedule Follow-up Appointment: Yes Medication Reconciliation completed and provided to Patient/Care Yes Olean Sangster: Provided on Clinical Summary of Care: 06/02/2016 Form Type Recipient Paper Patient AS Electronic Signature(s) Signed: 06/02/2016 5:07:37 PM By: Gretta Cool RN, BSN, Kim RN, BSN Previous Signature: 06/02/2016 9:07:27 AM Version By: Ruthine Dose Entered By: Gretta Cool RN, BSN, Kim on 06/02/2016 09:09:09 [place], [place]. ([id number]) -------------------------------------------------------------------------------- Lower Extremity Assessment Details Patient Name: Seddon, Ascension L. Date of Service: 06/02/2016 8:00 AM Medical Record Number: [id number] Patient Account Number: [id number] Date of Birth/Sex: 31-May-1923 (83 y.o. Male) Treating RN: Cornell Barman Primary Care Alvester Eads: [facility], Dossie Der Other Clinician: Referring Terez Montee: [facility], Dossie Der Treating Mattisen Pohlmann/Extender: Tito Dine in Treatment: 0 Electronic Signature(s) Signed: 06/02/2016 5:07:37 PM By: Gretta Cool RN, BSN, Kim RN, BSN Entered By: Gretta Cool, RN, BSN, Kim on 06/02/2016 08:29:33 [place], [place] ([id number]) -------------------------------------------------------------------------------- Multi Wound Chart Details Patient Name: Carneal, Byren L. Date of Service: 06/02/2016 8:00 AM Medical Record Number: [id number] Patient Account Number: [id number] Date  of Birth/Sex: [birth year] (83 y.o. Male) Treating RN: Cornell Barman Primary Care Nakisha Chai: [facility], Dossie Der Other Clinician: Referring Ladarrious Kirksey: [facility], SYED Treating Coutney Wildermuth/Extender: Tito Dine in Treatment: 0 Vital Signs Height(in): 71 Pulse(bpm): 68 Weight(lbs): 183 Blood Pressure 116/56 (mmHg): Body Mass Index(BMI): 26 Temperature(F): Respiratory Rate 16 (breaths/min): Photos: [N/A:N/A] Wound Location: Right Upper Arm - Lateral Left Upper Arm - Lateral N/A Wounding Event: Trauma Trauma N/A Primary Etiology: Trauma, Other Trauma, Other N/A Date  Acquired: 05/13/2016 05/19/2016 N/A Weeks of Treatment: 0 0 N/A Wound Status: Open Open N/A Clustered Wound: No Yes N/A Clustered Quantity: N/A 4 N/A Measurements L x W x D 1.4x0.7x0.1 11x7x0.21 N/A (cm) Area (cm) : 0.77 60.476 N/A Volume (cm) : 0.077 12.7 N/A Classification: Partial Thickness Partial Thickness N/A Exudate Amount: Medium Large N/A Exudate Type: Serous Serosanguineous N/A Exudate Color: amber red, brown N/A Wound Margin: Flat and Intact Flat and Intact N/A Granulation Amount: Large (67-100%) Medium (34-66%) N/A Granulation Quality: Red Pink N/A Necrotic Amount: None Present (0%) Medium (34-66%) N/A Exposed Structures: Fascia: No Fascia: No N/A Fat Layer (Subcutaneous Fat Layer (Subcutaneous Tissue) Exposed: No Tissue) Exposed: No Tendon: No Tendon: No Horsch, Pharaoh L. ([id number]) Muscle: No Muscle: No Joint: No Joint: No Bone: No Bone: No Limited to Skin Limited to Skin Breakdown Breakdown Epithelialization: Large (67-100%) Small (1-33%) N/A Periwound Skin Texture: Excoriation: No Excoriation: No N/A Induration: No Induration: No Callus: No Callus: No Crepitus: No Crepitus: No Rash: No Rash: No Scarring: No Scarring: No Periwound Skin Maceration: No Maceration: No N/A Moisture: Dry/Scaly: No Dry/Scaly: No Periwound Skin Color: Atrophie Blanche: No Atrophie Blanche: No N/A Cyanosis:  No Cyanosis: No Ecchymosis: No Ecchymosis: No Erythema: No Erythema: No Hemosiderin Staining: No Hemosiderin Staining: No Mottled: No Mottled: No Pallor: No Pallor: No Rubor: No Rubor: No Tenderness on No No N/A Palpation: Wound Preparation: Ulcer Cleansing: Ulcer Cleansing: N/A Rinsed/Irrigated with Rinsed/Irrigated with Saline Saline Topical Anesthetic Topical Anesthetic Applied: None Applied: Other: Lidocaine 4% Treatment Notes Wound #7 (Right, Lateral Upper Arm) 1. Cleansed with: Clean wound with Normal Saline 2. Anesthetic Topical Lidocaine 4% cream to wound bed prior to debridement 3. Peri-wound Care: Ointment 5. Secondary Dressing Applied Bordered Foam Dressing Notes Neosporin and bandaid Wound #8 (Left, Lateral Upper Arm) 1. Cleansed with: Clean wound with Normal Saline Marzette, Daiquan L. ([id number]) 2. Anesthetic Topical Lidocaine 4% cream to wound bed prior to debridement 4. Dressing Applied: Prisma Ag 5. Secondary Dressing Applied ABD and Kerlix/Conform Notes Right are Neosporin and covered dressing Electronic Signature(s) Signed: 06/03/2016 7:57:33 AM By: Linton Ham MD Entered By: Linton Ham on 06/02/2016 09:12:50 [place], [place] ([id number]) -------------------------------------------------------------------------------- [place] Details Patient Name: Stehle, Kert L. Date of Service: 06/02/2016 8:00 AM Medical Record Number: [id number] Patient Account Number: [id number] Date of Birth/Sex: 1923/07/19 (83 y.o. Male) Treating RN: Cornell Barman Primary Care Craigory Toste: [facility], Dossie Der Other Clinician: Referring Zenobia Kuennen: [facility], SYED Treating Peggy Monk/Extender: Tito Dine in Treatment: 0 Active Inactive ` Abuse / Safety / Falls / Self Care Management Nursing Diagnoses: Impaired physical mobility Potential for falls Goals: Patient will remain injury free Date Initiated: 06/02/2016 Target Resolution Date:  06/09/2016 Goal Status: Active Patient/caregiver will verbalize understanding of skin care regimen Date Initiated: 06/02/2016 Target Resolution Date: 06/09/2016 Goal Status: Active Interventions: Assess fall risk on admission and as needed Notes: ` Necrotic Tissue Nursing Diagnoses: Impaired tissue integrity related to necrotic/devitalized tissue Goals: Necrotic/devitalized tissue will be minimized in the wound bed Date Initiated: 06/02/2016 Target Resolution Date: 06/09/2016 Goal Status: Active Interventions: Assess patient pain level pre-, during and post procedure and prior to discharge Treatment Activities: Apply topical anesthetic as ordered : 06/02/2016 CACE, OSORTO ([id number]) Notes: ` Orientation to the Wound Care Program Nursing Diagnoses: Knowledge deficit related to the wound healing center program Goals: Patient/caregiver will verbalize understanding of the [place] Date Initiated: 06/02/2016 Target Resolution Date: 06/09/2016 Goal Status: Active Interventions: Provide education on orientation to the  wound center Notes: ` Soft Tissue Infection Nursing Diagnoses: Impaired tissue integrity Knowledge deficit related to disease process and management Potential for infection: soft tissue Goals: Patient will remain free of wound infection Date Initiated: 06/02/2016 Target Resolution Date: 06/09/2016 Goal Status: Active Signs and symptoms of infection will be recognized early to allow for prompt treatment Date Initiated: 06/02/2016 Target Resolution Date: 06/09/2016 Goal Status: Active Interventions: Assess signs and symptoms of infection every visit Notes: ` Wound/Skin Impairment Nursing Diagnoses: Impaired tissue integrity Goals: Ulcer/skin breakdown will heal within 14 weeks EDIN, KON ([id number]) Date Initiated: 06/02/2016 Target Resolution Date: 10/02/2016 Goal Status: Active Interventions: Assess ulceration(s) every  visit Notes: Electronic Signature(s) Signed: 06/02/2016 5:07:37 PM By: Gretta Cool, RN, BSN, Kim RN, BSN Entered By: Gretta Cool, RN, BSN, Kim on 06/02/2016 08:37:45 [place], Chidera L. ([id number]) -------------------------------------------------------------------------------- Pain Assessment Details Patient Name: Chevez, Alonzo L. Date of Service: 06/02/2016 8:00 AM Medical Record Number: [id number] Patient Account Number: [id number] Date of Birth/Sex: 1923-06-14 (83 y.o. Male) Treating RN: Cornell Barman Primary Care Rae Sutcliffe: [facility], Dossie Der Other Clinician: Referring Echo Propp: [facility], SYED Treating Deyton Ellenbecker/Extender: Tito Dine in Treatment: 0 Active Problems Location of Pain Severity and Description of Pain Patient Has Paino No Site Locations With Dressing Change: No Pain Management and Medication Current Pain Management: Electronic Signature(s) Signed: 06/02/2016 5:07:37 PM By: Gretta Cool, RN, BSN, Kim RN, BSN Entered By: Gretta Cool, RN, BSN, Kim on 06/02/2016 08:14:45 [place], [place] ([id number]) -------------------------------------------------------------------------------- Patient/Caregiver Education Details Patient Name: Brunner, Jordyn L. Date of Service: 06/02/2016 8:00 AM Medical Record Number: [id number] Patient Account Number: [id number] Date of Birth/Gender: 03/31/1923 (83 y.o. Male) Treating RN: Cornell Barman Primary Care Physician: [facility], Dossie Der Other Clinician: Referring Physician: [facility], Dossie Der Treating Physician/Extender: Tito Dine in Treatment: 0 Education Assessment Education Provided To: Patient Education Topics Provided Welcome To [facility] [place]: Handouts: Welcome To [facility] [place] Methods: Demonstration Responses: State content correctly Wound/Skin Impairment: Handouts: Caring for Your Ulcer Methods: Demonstration, Explain/Verbal Responses: State content correctly Electronic Signature(s) Signed: 06/02/2016 5:07:37 PM By: Gretta Cool, RN, BSN, Kim RN, BSN Entered By:  Gretta Cool, RN, BSN, Kim on 06/02/2016 09:09:25 [place], [place]. ([id number]) -------------------------------------------------------------------------------- Wound Assessment Details Patient Name: Andel, Abriel L. Date of Service: 06/02/2016 8:00 AM Medical Record Number: [id number] Patient Account Number: [id number] Date of Birth/Sex: [birth year] (83 y.o. Male) Treating RN: Cornell Barman Primary Care Delynn Pursley: [facility], SYED Other Clinician: Referring Kewon Statler: [facility], SYED Treating Dorismar Chay/Extender: Ricard Dillon Weeks in Treatment: 0 Wound Status Wound Number: 7 Primary Etiology: Trauma, Other Wound Location: Right Upper Arm - Lateral Wound Status: Open Wounding Event: Trauma Date Acquired: 05/13/2016 Weeks Of Treatment: 0 Clustered Wound: No Photos Wound Measurements Length: (cm) 1.4 Width: (cm) 0.7 Depth: (cm) 0.1 Area: (cm) 0.77 Volume: (cm) 0.077 % Reduction in Area: % Reduction in Volume: Epithelialization: Large (67-100%) Tunneling: No Undermining: No Wound Description Classification: Partial Thickness Wound Margin: Flat and Intact Exudate Amount: Medium Exudate Type: Serous Exudate Color: amber Foul Odor After Cleansing: No Slough/Fibrino No Wound Bed Granulation Amount: Large (67-100%) Exposed Structure Granulation Quality: Red Fascia Exposed: No Necrotic Amount: None Present (0%) Fat Layer (Subcutaneous Tissue) Exposed: No Tendon Exposed: No Muscle Exposed: No Joint Exposed: No Bone Exposed: No Frees, Demetria L. ([id number]) Limited to Skin Breakdown Periwound Skin Texture Texture Color No Abnormalities Noted: No No Abnormalities Noted: No Callus: No Atrophie Blanche: No Crepitus: No Cyanosis: No Excoriation: No Ecchymosis: No Induration: No Erythema: No Rash: No Hemosiderin Staining: No Scarring: No Mottled: No Pallor:  No Moisture Rubor: No No Abnormalities Noted: No Dry / Scaly: No Maceration: No Wound Preparation Ulcer Cleansing:  Rinsed/Irrigated with Saline Topical Anesthetic Applied: None Treatment Notes Wound #7 (Right, Lateral Upper Arm) 1. Cleansed with: Clean wound with Normal Saline 2. Anesthetic Topical Lidocaine 4% cream to wound bed prior to debridement 3. Peri-wound Care: Ointment 5. Secondary Dressing Applied Bordered Foam Dressing Notes Neosporin and bandaid Electronic Signature(s) Signed: 06/02/2016 5:07:37 PM By: Gretta Cool, RN, BSN, Kim RN, BSN Entered By: Gretta Cool, RN, BSN, Kim on 06/02/2016 08:27:35 [place], [place] ([id number]) -------------------------------------------------------------------------------- Wound Assessment Details Patient Name: Anglin, Alyaan L. Date of Service: 06/02/2016 8:00 AM Medical Record Number: [id number] Patient Account Number: [id number] Date of Birth/Sex: [birth year] (83 y.o. Male) Treating RN: Cornell Barman Primary Care Dajah Fischman: [facility], SYED Other Clinician: Referring Monnie Anspach: [facility], SYED Treating Kaelyn Innocent/Extender: Ricard Dillon Weeks in Treatment: 0 Wound Status Wound Number: 8 Primary Etiology: Trauma, Other Wound Location: Left Upper Arm - Lateral Wound Status: Open Wounding Event: Trauma Date Acquired: 05/19/2016 Weeks Of Treatment: 0 Clustered Wound: Yes Photos Wound Measurements Length: (cm) 11 Width: (cm) 7 Depth: (cm) 0.21 Clustered Quantity: 4 Area: (cm) 60.476 Volume: (cm) 12.7 % Reduction in Area: % Reduction in Volume: Epithelialization: Small (1-33%) Tunneling: No Undermining: No Wound Description Classification: Partial Thickness Foul Odor Afte Wound Margin: Flat and Intact Slough/Fibrino Exudate Amount: Large Exudate Type: Serosanguineous Exudate Color: red, brown r Cleansing: No No Wound Bed Granulation Amount: Medium (34-66%) Exposed Structure Granulation Quality: Pink Fascia Exposed: No Necrotic Amount: Medium (34-66%) Fat Layer (Subcutaneous Tissue) Exposed: No Necrotic Quality: Adherent Slough Tendon Exposed: No Muscle  Exposed: No Joint Exposed: No Kleinert, Jaizon L. ([id number]) Bone Exposed: No Limited to Skin Breakdown Periwound Skin Texture Texture Color No Abnormalities Noted: No No Abnormalities Noted: No Callus: No Atrophie Blanche: No Crepitus: No Cyanosis: No Excoriation: No Ecchymosis: No Induration: No Erythema: No Rash: No Hemosiderin Staining: No Scarring: No Mottled: No Pallor: No Moisture Rubor: No No Abnormalities Noted: No Dry / Scaly: No Maceration: No Wound Preparation Ulcer Cleansing: Rinsed/Irrigated with Saline Topical Anesthetic Applied: Other: Lidocaine 4%, Treatment Notes Wound #8 (Left, Lateral Upper Arm) 1. Cleansed with: Clean wound with Normal Saline 2. Anesthetic Topical Lidocaine 4% cream to wound bed prior to debridement 4. Dressing Applied: Prisma Ag 5. Secondary Dressing Applied ABD and Kerlix/Conform Notes Right are Neosporin and covered dressing Electronic Signature(s) Signed: 06/02/2016 5:07:37 PM By: Gretta Cool, RN, BSN, Kim RN, BSN Entered By: Gretta Cool, RN, BSN, Kim on 06/02/2016 08:29:21 Pilch, Nghia Carlean Jews ([id number]) -------------------------------------------------------------------------------- Vitals Details Patient Name: Schroeck, Aldyn L. Date of Service: 06/02/2016 8:00 AM Medical Record Number: [id number] Patient Account Number: [id number] Date of Birth/Sex: 05/03/1923 (83 y.o. Male) Treating RN: Cornell Barman Primary Care Flavia Bruss: [facility] SYED Other Clinician: Referring Ottis Vacha: [facility], SYED Treating Delpha Perko/Extender: Tito Dine in Treatment: 0 Vital Signs Time Taken: 08:14 Pulse (bpm): 68 Height (in): 71 Respiratory Rate (breaths/min): 16 Weight (lbs): 183 Blood Pressure (mmHg): 116/56 Body Mass Index (BMI): 25.5 Reference Range: 80 - 120 mg / dl Electronic Signature(s) Signed: 06/02/2016 5:07:37 PM By: Gretta Cool, RN, BSN, Kim RN, BSN Entered By: Gretta Cool, RN, BSN, Kim on 06/02/2016 08:15:27

## 2016-06-04 NOTE — Progress Notes (Signed)
PRIMUS, GRITTON ([id number]) Visit Report for 06/02/2016 Chief Complaint Document Details Patient Name: [name], [name]. Date of Service: 06/02/2016 8:00 AM Medical Record Number: [id number] Patient Account Number: [id number] Date of Birth/Sex: 1923/09/18 (83 y.o. Male) Treating RN: Montey Hora Primary Care Provider: [facility], Dossie Der Other Clinician: Referring Provider: [facility], [facility] Treating Provider/Extender: Tito Dine in Treatment: 0 Information Obtained from: Patient Chief Complaint Patient presents to the wound care center for a consult due non healing wound to his right elbow, left knee, left upper arm and left upper back which she's had for about a week Electronic Signature(s) Signed: 06/03/2016 7:57:33 AM By: Linton Ham MD Entered By: Linton Ham on 06/02/2016 09:13:06 [place], [place]. ([id number]) -------------------------------------------------------------------------------- HPI Details Patient Name: [name], [name]. Date of Service: 06/02/2016 8:00 AM Medical Record Number: [id number] Patient Account Number: [id number] Date of Birth/Sex: 1923-09-27 (83 y.o. Male) Treating RN: Montey Hora Primary Care Provider: [facility], Dossie Der Other Clinician: Referring Provider: [facility], [facility] Treating Provider/Extender: Tito Dine in Treatment: 0 History of Present Illness Location: right elbow, left knee, left upper arm and left upper back Quality: Patient reports experiencing a dull pain to affected area(s). Severity: Patient states wound (s) are getting better. Duration: Patient has had the wound for < 1 weeks prior to presenting for treatment Timing: Pain in wound is Intermittent (comes and goes Context: The wound occurred when the patient had a fall and hurt his back Modifying Factors: Other treatment(s) tried include:local care with Neosporin ointment Associated Signs and Symptoms: Patient reports having:scar tissue opens out every time they do dressing HPI  Description: 83 year old patient who was in [place] in May a couple of times for a lacerated wound which was healed over a couple of weeks. He now comes with recent injuries to his back and knees and both arms sustained after a fall. He did not seek any medical help and they use dressing material which was previously there. Past medical history significant for carotid stenosis, [name], [name], dyslipidemia, hypertension, anxiety, GERD, multiple myeloma, BPH, inguinal hernia and is status post appendectomy, hip fracture, cardiac catheterization. He smoked in the remote past and has not smoked for several years. He has had long-term low back pain and was evaluated for pain radiating to the left groin. 02-06-16 [name] for evaluation of multiple skin tears, he is accompanied by his wife. Neither patient nor his wife expressed the concerns regarding his wounds and/or the treatment plan. They deny any falls since his last appointment or any new injuries. READMISSION this is a 83 year old man who is been seen in the clinic in 2017 for multiple skin tears after falling. These were fairly easy to close over unfortunately the patient's recent course is not gone well. He fell and suffered a right hip fracture at the beginning of March. He was discharged to a skilled facility and sometime during the stay at the skilled facility his daughter noted a bandage on his left elbow. It wasn't told later that they learned he had fallen out of bed sustaining an injury. After discharge a week ago he apparently had a off- balance issue falling against the wall and hit the left elbow again. Been using peroxide and Neosporin. Seen by the family doctor yesterday and x-ray of the left elbow was negative. He was put on doxycycline. The patient has a history of coronary artery disease status post stent, [name], hypertension, lumbar spinal stenosis, multiple myeloma and BPH. He is apparently had bilateral  hip replacements secondary to falling with the most recent one being on the right. He states that on the times he has fallen he has got up to go to the bathroom and becomes "swimmy headed" although he does not describe the off-balance sensation were reproducibly with changes in position [name], [name]. ([id number]) Electronic Signature(s) Signed: 06/03/2016 7:57:33 AM By: Baltazar Najjar MD Entered By: Baltazar Najjar on 06/02/2016 09:16:36 [name], [name]. ([id number]) -------------------------------------------------------------------------------- Physical Exam Details Patient Name: [name], [name]. Date of Service: 06/02/2016 8:00 AM Medical Record Number: [id number] Patient Account Number: [id number] Date of Birth/Sex: 1923-07-25 (83 y.o. Male) Treating RN: Curtis Sites Primary Care Provider: [facility], Kathi Ludwig Other Clinician: Referring Provider: [facility], [facility] Treating Provider/Extender: Altamese [place] in Treatment: 0 Constitutional Patient is hypotensive. Wide pulse pressure. Pulse regular and within target range for patient.Marland Kitchen Respirations regular, non-labored and within target range.. Temperature is normal and within the target range for the patient.. Somewhat frail-looking man but not in any distress. Alert and responsive. Ears, Nose, Mouth, and Throat Oropharynx within normal limits, without erythema, exudate or ulceration.. Neck No carotid bruits. Respiratory Respiratory effort is easy and symmetric bilaterally. Rate is normal at rest and on room air.. Bilateral breath sounds are clear and equal in all lobes with no wheezes, rales or rhonchi.. Cardiovascular Heart rhythm and rate regular, without murmur or gallop.. Gastrointestinal (GI) Abdomen is soft and non-distended without masses or tenderness. Bowel sounds active in all quadrants.. Lymphatic None palpable in the epitrochlear or axilla. Musculoskeletal There is no effusion in the left elbow range of motion is  normal. Psychiatric No evidence of depression, anxiety, or agitation. Calm, cooperative, and communicative. Appropriate interactions and affect.. Notes Wound exam; the patient has 4 superficial areas over the dorsal left elbow. Surface of these looks like there is healthy granulation. No debridement was required. I don't see any evidence of infection here. No fluid is palpated on the left elbow range of motion seems to be intact oHe also has a much more superficial area over the right elbow Electronic Signature(s) Signed: 06/03/2016 7:57:33 AM By: Baltazar Najjar MD Entered By: Baltazar Najjar on 06/02/2016 09:18:52 [name], [name]. ([id number]) -------------------------------------------------------------------------------- Physician Orders Details Patient Name: [name], [name]. Date of Service: 06/02/2016 8:00 AM Medical Record Number: [id number] Patient Account Number: [id number] Date of Birth/Sex: October 01, 1923 (83 y.o. Male) Treating RN: Huel Coventry Primary Care Provider: [facility], Kathi Ludwig Other Clinician: Referring Provider: [name], [facility] Treating Provider/Extender: Altamese [place] in Treatment: 0 Verbal / Phone Orders: No Diagnosis Coding Wound Cleansing Wound #7 Right,Lateral Upper Arm o Clean wound with wound cleanser. Wound #8 Left,Lateral Upper Arm o Clean wound with wound cleanser. Anesthetic Wound #7 Right,Lateral Upper Arm o Topical Lidocaine 4% cream applied to wound bed prior to debridement Wound #8 Left,Lateral Upper Arm o Topical Lidocaine 4% cream applied to wound bed prior to debridement Primary Wound Dressing Wound #7 Right,Lateral Upper Arm o Prisma Ag Wound #8 Left,Lateral Upper Arm o Prisma Ag Secondary Dressing Wound #7 Right,Lateral Upper Arm o ABD and Kerlix/Conform Wound #8 Left,Lateral Upper Arm o ABD and Kerlix/Conform Dressing Change Frequency Wound #7 Right,Lateral Upper Arm o Three times weekly - Once in clinic Wound #8 Left,Lateral  Upper Arm o Three times weekly - Once in clinic Follow-up Appointments [employer], Armaan [name]. ([id number]) Wound #7 Right,Lateral Upper Arm o Return Appointment in 1 week. Wound #8 Left,Lateral Upper Arm o Return Appointment in 1 week. Home Health Wound #7 Right,Lateral Upper Arm o Initiate Home  Health for [place] Visits - [place] Nurse may visit PRN to address patientos wound care needs. o FACE TO FACE ENCOUNTER: MEDICARE and MEDICAID PATIENTS: I certify that this patient is under my care and that I had a face-to-face encounter that meets the physician face-to-face encounter requirements with this patient on this date. The encounter with the patient was in whole or in part for the following MEDICAL CONDITION: (primary reason for [place]) MEDICAL NECESSITY: I certify, that based on my findings, NURSING services are a medically necessary home health service. HOME BOUND STATUS: I certify that my clinical findings support that this patient is homebound (i.e., Due to illness or injury, pt requires aid of supportive devices such as crutches, cane, wheelchairs, walkers, the use of special transportation or the assistance of another person to leave their place of residence. There is a normal inability to leave the home and doing so requires considerable and taxing effort. Other absences are for medical reasons / religious services and are infrequent or of short duration when for other reasons). o If current dressing causes regression in wound condition, may D/C ordered dressing product/s and apply Normal Saline Moist Dressing daily until next [place] / Other MD appointment. [place] of regression in wound condition at [phone]. o Please direct any NON-WOUND related issues/requests for orders to patient's Primary Care Physician Wound #8 Left,Lateral Upper Arm o [place] for [place] Visits - [place] Nurse may visit PRN to address patientos wound care needs. o FACE TO FACE ENCOUNTER: MEDICARE and MEDICAID PATIENTS: I certify that this patient is under my care and that I had a face-to-face encounter that meets the physician face-to-face encounter requirements with this patient on this date. The encounter with the patient was in whole or in part for the following MEDICAL CONDITION: (primary reason for [address]) MEDICAL NECESSITY: I certify, that based on my findings, NURSING services are a medically necessary home health service. HOME BOUND STATUS: I certify that my clinical findings support that this patient is homebound (i.e., Due to illness or injury, pt requires aid of supportive devices such as crutches, cane, wheelchairs, walkers, the use of special transportation or the assistance of another person to leave their place of residence. There is a normal inability to leave the home and doing so requires considerable and taxing effort. Other absences are for medical reasons / religious services and are infrequent or of short duration when for other reasons). o If current dressing causes regression in wound condition, may D/C ordered dressing product/s and apply Normal Saline Moist Dressing daily until next [place] / Other MD appointment. [place] of regression in wound condition at [phone]. [name], [name] ([id number]) o Please direct any NON-WOUND related issues/requests for orders to patient's Primary Care Physician Medications-please add to medication list. Wound #7 Right,Lateral Upper Arm o P.O. Antibiotics - Complete antibiotics Wound #8 Left,Lateral Upper Arm o P.O. Antibiotics - Complete antibiotics Electronic Signature(s) Signed: 06/02/2016 5:07:37 PM By: Gretta Cool RN, BSN, Kim RN, BSN Signed: 06/03/2016 7:57:33 AM By: Linton Ham MD Entered By: Gretta Cool RN, BSN,  Kim on 06/02/2016 08:56:26 [place], [place] ([id number]) -------------------------------------------------------------------------------- Problem List Details Patient Name: Trani, Terre [name]. Date of Service: 06/02/2016 8:00 AM Medical Record Number: [id number] Patient Account Number: [id number] Date of Birth/Sex: 12-Feb-1924 (83 y.o. Male) Treating RN: Montey Hora Primary Care Provider: [facility], [facility]  Other Clinician: Referring Provider: [facility], [facility] Treating Provider/Extender: Tito Dine in Treatment: 0 Active Problems ICD-10 Encounter Code Description Active Date Diagnosis S41.112D Laceration without foreign body of left upper arm, 06/02/2016 Yes subsequent encounter S41.111D Laceration without foreign body of right upper arm, 06/02/2016 Yes subsequent encounter I95.2 Hypotension due to drugs 06/02/2016 Yes Inactive Problems Resolved Problems Electronic Signature(s) Signed: 06/03/2016 7:57:33 AM By: Linton Ham MD Entered By: Linton Ham on 06/02/2016 09:12:43 [name], [name]. ([id number]) -------------------------------------------------------------------------------- Progress Note Details Patient Name: [name], [name]. Date of Service: 06/02/2016 8:00 AM Medical Record Number: [id number] Patient Account Number: [id number] Date of Birth/Sex: [birth year] (83 y.o. Male) Treating RN: Montey Hora Primary Care Provider: [name], [facility] Other Clinician: Referring Provider: [facility], [facility] Treating Provider/Extender: Tito Dine in Treatment: 0 Subjective Chief Complaint Information obtained from Patient Patient presents to the wound care center for a consult due non healing wound to his right elbow, left knee, left upper arm and left upper back which she's had for about a week History of Present Illness (HPI) The following HPI elements were documented for the patient's wound: Location: right elbow, left knee, left upper arm and left upper back Quality: Patient reports  experiencing a dull pain to affected area(s). Severity: Patient states wound (s) are getting better. Duration: Patient has had the wound for < 1 weeks prior to presenting for treatment Timing: Pain in wound is Intermittent (comes and goes Context: The wound occurred when the patient had a fall and hurt his back Modifying Factors: Other treatment(s) tried include:local care with Neosporin ointment Associated Signs and Symptoms: Patient reports having:scar tissue opens out every time they do dressing 83 year old patient who was in [place] in May a couple of times for a lacerated wound which was healed over a couple of weeks. He now comes with recent injuries to his back and knees and both arms sustained after a fall. He did not seek any medical help and they use dressing material which was previously there. Past medical history significant for carotid stenosis, [name], [name], dyslipidemia, hypertension, anxiety, GERD, multiple myeloma, BPH, inguinal hernia and is status post appendectomy, hip fracture, cardiac catheterization. He smoked in the remote past and has not smoked for several years. He has had long-term low back pain and was evaluated for pain radiating to the left groin. 02-06-16 Mr. Salminen progress for evaluation of multiple skin tears, he is accompanied by his wife. Neither patient nor his wife expressed the concerns regarding his wounds and/or the treatment plan. They deny any falls since his last appointment or any new injuries. READMISSION this is a 83 year old man who is been seen in the clinic in 2017 for multiple skin tears after falling. These were fairly easy to close over unfortunately the patient's recent course is not gone well. He fell and suffered a right hip fracture at the beginning of March. He was discharged to a skilled facility and sometime during the stay at the skilled facility his daughter noted a bandage on his left elbow. It wasn't told later that  they learned he had fallen out of bed sustaining an injury. After discharge a week ago he apparently had a off- balance issue falling against the wall and hit the left elbow again. Been using peroxide and Neosporin. Seen by the family doctor yesterday and x-ray of the left elbow was negative. He was put on doxycycline. [name], [name]. ([id number]) The patient has a history of coronary artery disease status post stent, Bell's  palsy, hypertension, lumbar spinal stenosis, multiple myeloma and BPH. He is apparently had bilateral hip replacements secondary to falling with the most recent one being on the right. He states that on the times he has fallen he has got up to go to the bathroom and becomes "swimmy headed" although he does not describe the off-balance sensation were reproducibly with changes in position Wound History Patient presents with 5 open wounds that have been present for approximately 2 weeks. Patient has been treating wounds in the following manner: peroxide, neosporin. Laboratory tests have not been performed in the last month. Patient reportedly has not tested positive for an antibiotic resistant organism. Patient reportedly has not tested positive for osteomyelitis. Patient reportedly has not had testing performed to evaluate circulation in the legs. Patient History Information obtained from Patient, Caregiver, Chart. Allergies baclofen, Bactrim, NSAIDS (Non-Steroidal Anti-Inflammatory Drug) Family History Diabetes - Father, Heart Disease - Mother, Stroke - Mother, Siblings, No family history of Cancer, Hypertension, Kidney Disease, Lung Disease, Seizures. Social History Former smoker, Marital Status - Married, Alcohol Use - Never, Drug Use - No History, Caffeine Use - Daily. Medical History Psychiatric Denies history of Confinement Anxiety Medical And Surgical History Notes Constitutional Symptoms (General Health) Carotid stenosis; [name]; Mutiple Myeloma; Lymphoma;  BPH Review of Systems (ROS) Eyes The patient has no complaints or symptoms. Ear/Nose/Mouth/Throat The patient has no complaints or symptoms. Hematologic/Lymphatic The patient has no complaints or symptoms. Respiratory The patient has no complaints or symptoms. Cardiovascular The patient has no complaints or symptoms. Gastrointestinal The patient has no complaints or symptoms. Endocrine The patient has no complaints or symptoms. [name], [name]. ([id number]) Genitourinary The patient has no complaints or symptoms. Immunological The patient has no complaints or symptoms. Integumentary (Skin) Complains or has symptoms of Wounds, Bleeding or bruising tendency. Denies complaints or symptoms of Breakdown, Swelling. Musculoskeletal The patient has no complaints or symptoms. Neurologic The patient has no complaints or symptoms. Oncologic The patient has no complaints or symptoms. Psychiatric The patient has no complaints or symptoms. Objective Constitutional Patient is hypotensive. Wide pulse pressure. Pulse regular and within target range for patient.Marland Kitchen Respirations regular, non-labored and within target range.. Temperature is normal and within the target range for the patient.. Somewhat frail-looking man but not in any distress. Alert and responsive. Vitals Time Taken: 8:14 AM, Height: 71 in, Weight: 183 lbs, BMI: 25.5, Pulse: 68 bpm, Respiratory Rate: 16 breaths/min, Blood Pressure: 116/56 mmHg. Ears, Nose, Mouth, and Throat Oropharynx within normal limits, without erythema, exudate or ulceration.. Neck No carotid bruits. Respiratory Respiratory effort is easy and symmetric bilaterally. Rate is normal at rest and on room air.. Bilateral breath sounds are clear and equal in all lobes with no wheezes, rales or rhonchi.. Cardiovascular Heart rhythm and rate regular, without murmur or gallop.. Gastrointestinal (GI) Abdomen is soft and non-distended without masses or tenderness.  Bowel sounds active in all quadrants.. Lymphatic None palpable in the epitrochlear or axilla. [name], [name]. ([id number]) Musculoskeletal There is no effusion in the left elbow range of motion is normal. Psychiatric No evidence of depression, anxiety, or agitation. Calm, cooperative, and communicative. Appropriate interactions and affect.. General Notes: Wound exam; the patient has 4 superficial areas over the dorsal left elbow. Surface of these looks like there is healthy granulation. No debridement was required. I don't see any evidence of infection here. No fluid is palpated on the left elbow range of motion seems to be intact He also has a much more superficial area over the  right elbow Integumentary (Hair, Skin) Wound #7 status is Open. Original cause of wound was Trauma. The wound is located on the Right,Lateral Upper Arm. The wound measures 1.4cm length x 0.7cm width x 0.1cm depth; 0.77cm^2 area and 0.077cm^3 volume. The wound is limited to skin breakdown. There is no tunneling or undermining noted. There is a medium amount of serous drainage noted. The wound margin is flat and intact. There is large (67-100%) red granulation within the wound bed. There is no necrotic tissue within the wound bed. The periwound skin appearance did not exhibit: Callus, Crepitus, Excoriation, Induration, Rash, Scarring, Dry/Scaly, Maceration, Atrophie Blanche, Cyanosis, Ecchymosis, Hemosiderin Staining, Mottled, Pallor, Rubor, Erythema. Wound #8 status is Open. Original cause of wound was Trauma. The wound is located on the Left,Lateral Upper Arm. The wound measures 11cm length x 7cm width x 0.21cm depth; 60.476cm^2 area and 12.7cm^3 volume. The wound is limited to skin breakdown. There is no tunneling or undermining noted. There is a large amount of serosanguineous drainage noted. The wound margin is flat and intact. There is medium (34-66%) pink granulation within the wound bed. There is a medium  (34-66%) amount of necrotic tissue within the wound bed including Adherent Slough. The periwound skin appearance did not exhibit: Callus, Crepitus, Excoriation, Induration, Rash, Scarring, Dry/Scaly, Maceration, Atrophie Blanche, Cyanosis, Ecchymosis, Hemosiderin Staining, Mottled, Pallor, Rubor, Erythema. Assessment Active Problems ICD-10 S41.112D - Laceration without foreign body of left upper arm, subsequent encounter S41.111D - Laceration without foreign body of right upper arm, subsequent encounter I95.2 - Hypotension due to drugs [name], [name]. ([id number]) Plan Wound Cleansing: Wound #7 Right,Lateral Upper Arm: Clean wound with wound cleanser. Wound #8 Left,Lateral Upper Arm: Clean wound with wound cleanser. Anesthetic: Wound #7 Right,Lateral Upper Arm: Topical Lidocaine 4% cream applied to wound bed prior to debridement Wound #8 Left,Lateral Upper Arm: Topical Lidocaine 4% cream applied to wound bed prior to debridement Primary Wound Dressing: Wound #7 Right,Lateral Upper Arm: Prisma Ag Wound #8 Left,Lateral Upper Arm: Prisma Ag Secondary Dressing: Wound #7 Right,Lateral Upper Arm: ABD and Kerlix/Conform Wound #8 Left,Lateral Upper Arm: ABD and Kerlix/Conform Dressing Change Frequency: Wound #7 Right,Lateral Upper Arm: Three times weekly - Once in clinic Wound #8 Left,Lateral Upper Arm: Three times weekly - Once in clinic Follow-up Appointments: Wound #7 Right,Lateral Upper Arm: Return Appointment in 1 week. Wound #8 Left,Lateral Upper Arm: Return Appointment in 1 week. Home Health: Wound #7 Right,Lateral Upper Arm: [place] for [place] Visits - [place] Nurse may visit PRN to address patient s wound care needs. FACE TO FACE ENCOUNTER: MEDICARE and MEDICAID PATIENTS: I certify that this patient is under my care and that I had a face-to-face encounter that meets the physician face-to-face encounter requirements  with this patient on this date. The encounter with the patient was in whole or in part for the following MEDICAL CONDITION: (primary reason for [place]) MEDICAL NECESSITY: I certify, that based on my findings, NURSING services are a medically necessary home health service. HOME BOUND STATUS: I certify that my clinical findings support that this patient is homebound (i.e., Due to illness or injury, pt requires aid of supportive devices such as crutches, cane, wheelchairs, walkers, the use of special transportation or the assistance of another person to leave their place of residence. There is a normal inability to leave the home and doing so requires considerable and taxing effort. Other absences are for medical reasons / religious services and are infrequent or  of short duration when for other reasons). If current dressing causes regression in wound condition, may D/C ordered dressing product/s and apply Normal Saline Moist Dressing daily until next [place] / Other MD appointment. Notify Wound Moll, Quinterrius [name]. ([id number]) [place] of regression in wound condition at [phone]. Please direct any NON-WOUND related issues/requests for orders to patient's Primary Care Physician Wound #8 Left,Lateral Upper Arm: [place] for [place] Visits - [place] Nurse may visit PRN to address patient s wound care needs. FACE TO FACE ENCOUNTER: MEDICARE and MEDICAID PATIENTS: I certify that this patient is under my care and that I had a face-to-face encounter that meets the physician face-to-face encounter requirements with this patient on this date. The encounter with the patient was in whole or in part for the following MEDICAL CONDITION: (primary reason for [place]) MEDICAL NECESSITY: I certify, that based on my findings, NURSING services are a medically necessary home health service. HOME BOUND STATUS: I certify that my  clinical findings support that this patient is homebound (i.e., Due to illness or injury, pt requires aid of supportive devices such as crutches, cane, wheelchairs, walkers, the use of special transportation or the assistance of another person to leave their place of residence. There is a normal inability to leave the home and doing so requires considerable and taxing effort. Other absences are for medical reasons / religious services and are infrequent or of short duration when for other reasons). If current dressing causes regression in wound condition, may D/C ordered dressing product/s and apply Normal Saline Moist Dressing daily until next [place] / Other MD appointment. [place] of regression in wound condition at [phone]. Please direct any NON-WOUND related issues/requests for orders to patient's Primary Care Physician Medications-please add to medication list.: Wound #7 Right,Lateral Upper Arm: P.O. Antibiotics - Complete antibiotics Wound #8 Left,Lateral Upper Arm: P.O. Antibiotics - Complete antibiotics #1 traumatic lacerations as described over the left elbow. These are clean with healthy-looking granulation. We use collagen Kerlix and Coban. [facility] who is already doing physical therapy. #2 recurrent falls with relative hypotension and wide pulse pressure. Apparently the patient is always lived in fear of a stroke which runs in his family [question subarachnoid hemorrhage] therefore he is always demanded very tight blood pressure control of his physicians. He also takes Klonopin which is associated with falling in older people. I've advised an appointment with his primary physician to look at that. He has had multiple falls including when he was in the clinic in December 2017 #3 x-ray of the elbow is already been done that was negative. #4 he is on doxycycline I did not change this although I don't see evidence  of infection Electronic Signature(s) Signed: 06/03/2016 7:57:33 AM By: Linton Ham MD Entered By: Linton Ham on 06/02/2016 09:20:41 [name], [name]. ([id number]) [place], [place] ([id number]) -------------------------------------------------------------------------------- ROS/PFSH Details Patient Name: Soden, Trentan [name]. Date of Service: 06/02/2016 8:00 AM Medical Record Number: [id number] Patient Account Number: [id number] Date of Birth/Sex: 05/30/1923 (83 y.o. Male) Treating RN: Cornell Barman Primary Care Provider: [facility], Dossie Der Other Clinician: Referring Provider: [facility], [facility] Treating Provider/Extender: Tito Dine in Treatment: 0 Information Obtained From Patient Caregiver Chart Wound History Do you currently have one or more open woundso Yes How many open wounds do you currently haveo 5 Approximately how long have you had your woundso 2 weeks How have you  been treating your wound(s) until nowo peroxide, neosporin Has your wound(s) ever healed and then re-openedo No Have you had any lab work done in the past montho No Have you tested positive for an antibiotic resistant organism (MRSA, VRE)o No Have you tested positive for osteomyelitis (bone infection)o No Have you had any tests for circulation on your legso No Integumentary (Skin) Complaints and Symptoms: Positive for: Wounds; Bleeding or bruising tendency Negative for: Breakdown; Swelling Medical History: Negative for: History of Burn; History of pressure wounds Constitutional Symptoms (General Health) Medical History: Past Medical History Notes: Carotid stenosis; [name]; Mutiple Myeloma; Lymphoma; BPH Eyes Complaints and Symptoms: No Complaints or Symptoms Medical History: Positive for: Cataracts - removed Negative for: Glaucoma; Optic Neuritis Ear/Nose/Mouth/Throat Complaints and Symptoms: No Complaints or Symptoms Depner, Hale [name]. ([id number]) Medical History: Positive for: Chronic sinus  problems/congestion Negative for: Middle ear problems Hematologic/Lymphatic Complaints and Symptoms: No Complaints or Symptoms Medical History: Negative for: Anemia; Hemophilia; Human Immunodeficiency Virus; Lymphedema; Sickle Cell Disease Respiratory Complaints and Symptoms: No Complaints or Symptoms Medical History: Positive for: Sleep Apnea Negative for: Aspiration; Asthma; Chronic Obstructive Pulmonary Disease (COPD); Pneumothorax; Tuberculosis Cardiovascular Complaints and Symptoms: No Complaints or Symptoms Medical History: Positive for: Coronary Artery Disease - stent 3 years ago Negative for: Angina; Arrhythmia; Congestive Heart Failure; Deep Vein Thrombosis; Hypertension; Hypotension; Myocardial Infarction; Peripheral Arterial Disease; Peripheral Venous Disease; Phlebitis; Vasculitis Gastrointestinal Complaints and Symptoms: No Complaints or Symptoms Medical History: Negative for: Cirrhosis ; Colitis; Crohnos; Hepatitis A; Hepatitis B; Hepatitis C Endocrine Complaints and Symptoms: No Complaints or Symptoms Medical History: Negative for: Type I Diabetes; Type II Diabetes Genitourinary Patry, Ashawn [name]. ([id number]) Complaints and Symptoms: No Complaints or Symptoms Medical History: Negative for: End Stage Renal Disease Immunological Complaints and Symptoms: No Complaints or Symptoms Medical History: Negative for: Lupus Erythematosus; Raynaudos; Scleroderma Musculoskeletal Complaints and Symptoms: No Complaints or Symptoms Medical History: Positive for: Osteoarthritis - back Negative for: Gout; Rheumatoid Arthritis; Osteomyelitis Neurologic Complaints and Symptoms: No Complaints or Symptoms Medical History: Negative for: Dementia; Neuropathy; Quadriplegia; Paraplegia; Seizure Disorder Oncologic Complaints and Symptoms: No Complaints or Symptoms Medical History: Positive for: Received Chemotherapy Negative for: Received Radiation Psychiatric Complaints  and Symptoms: No Complaints or Symptoms Medical History: Negative for: Anorexia/bulimia; Confinement Anxiety HBO Extended History Items Eyes: Ear/Nose/Mouth/Throat: Cataracts Chronic sinus problems/congestion Lopes, Bell [name]. ([id number]) Immunizations Pneumococcal Vaccine: Received Pneumococcal Vaccination: Yes Family and Social History Cancer: No; Diabetes: Yes - Father; Heart Disease: Yes - Mother; Hypertension: No; Kidney Disease: No; Lung Disease: No; Seizures: No; Stroke: Yes - Mother, Siblings; Former smoker; Marital Status - Married; Alcohol Use: Never; Drug Use: No History; Caffeine Use: Daily; Advanced Directives: Yes (Not Provided); Patient does not want information on Advanced Directives; Living Will: Yes (Not Provided) Electronic Signature(s) Signed: 06/02/2016 5:07:37 PM By: Elliot Gurney RN, BSN, Kim RN, BSN Signed: 06/03/2016 7:57:33 AM By: Baltazar Najjar MD Entered By: Elliot Gurney, RN, BSN, Kim on 06/02/2016 08:33:31 Hodgkins, Joshuah [name]. ([id number]) -------------------------------------------------------------------------------- SuperBill Details Patient Name: Mirante, Gagan [name]. Date of Service: 06/02/2016 Medical Record Number: [id number] Patient Account Number: [id number] Date of Birth/Sex: 11/16/1923 (83 y.o. Male) Treating RN: Curtis Sites Primary Care Provider: [facility] Kathi Ludwig Other Clinician: Referring Provider: [facility], [facility] Treating Provider/Extender: [employer] in Treatment: 0 Diagnosis Coding ICD-10 Codes Code Description S41.112D Laceration without foreign body of left upper arm, subsequent encounter S41.111D Laceration without foreign body of right upper arm, subsequent encounter I95.2 Hypotension due to drugs Facility Procedures CPT4 Code: 83299992 Description: 99214 - WOUND CARE  VISIT-LEV 4 EST PT Modifier: Quantity: 1 Physician Procedures CPT4 Code Description: 9290903 01499 - WC PHYS LEVEL 4 - EST PT ICD-10 Description Diagnosis S41.112D Laceration without  foreign body of left upper arm S41.111D Laceration without foreign body of right upper ar Modifier: , subsequent e m, subsequent Quantity: 1 ncounter encounter Electronic Signature(s) Signed: 06/03/2016 7:57:33 AM By: Linton Ham MD Entered By: Linton Ham on 06/02/2016 09:21:06

## 2016-06-05 NOTE — Telephone Encounter (Signed)
Pateitn came in for appointment. Dr. Manuella Ghazi to address then

## 2016-06-09 ENCOUNTER — Telehealth: Payer: Self-pay | Admitting: Family Medicine

## 2016-06-09 ENCOUNTER — Ambulatory Visit: Payer: Medicare Other | Admitting: Internal Medicine

## 2016-06-09 NOTE — Telephone Encounter (Signed)
Posey Pronto (wife) requesting refill on pantoprazole 40mg  (90 day supply); isosorbide 30mg  (90 day supply); sertraline 100mg  (45 tablets for 1 month). Please send to [facility]. [phone]

## 2016-06-10 ENCOUNTER — Other Ambulatory Visit: Payer: Self-pay | Admitting: Emergency Medicine

## 2016-06-10 ENCOUNTER — Encounter: Payer: Medicare Other | Admitting: Internal Medicine

## 2016-06-10 DIAGNOSIS — K219 Gastro-esophageal reflux disease without esophagitis: Secondary | ICD-10-CM

## 2016-06-10 DIAGNOSIS — I2510 Atherosclerotic heart disease of native coronary artery without angina pectoris: Secondary | ICD-10-CM

## 2016-06-10 DIAGNOSIS — S41112D Laceration without foreign body of left upper arm, subsequent encounter: Secondary | ICD-10-CM | POA: Diagnosis not present

## 2016-06-10 DIAGNOSIS — F3342 Major depressive disorder, recurrent, in full remission: Secondary | ICD-10-CM

## 2016-06-10 MED ORDER — SERTRALINE HCL 100 MG PO TABS: 150.0000 mg | ORAL_TABLET | Freq: Every day | ORAL | 1 refills | Status: DC

## 2016-06-10 MED ORDER — ISOSORBIDE MONONITRATE ER 30 MG PO TB24
30.0000 mg | ORAL_TABLET | Freq: Every day | ORAL | 1 refills | Status: DC
Start: 2016-06-10 — End: 2016-09-07

## 2016-06-10 MED ORDER — PANTOPRAZOLE SODIUM 40 MG PO TBEC: 40.0000 mg | DELAYED_RELEASE_TABLET | Freq: Every day | ORAL | 0 refills | Status: DC

## 2016-06-10 NOTE — Telephone Encounter (Signed)
Script sent to pharmacy.

## 2016-06-11 NOTE — Progress Notes (Signed)
[name] ([id number]) Visit Report for 06/10/2016 Chief Complaint Document Details Patient Name: [name], [name] Date of Service: 06/10/2016 10:15 AM Medical Record Number: [id number] Patient Account Number: [id number] Date of Birth/Sex: 1923/12/22 (83 y.o. Male) Treating RN: Montey Hora Primary Care Provider: [facility], Dossie Der Other Clinician: Referring Provider: [facility], [facility] Treating Provider/Extender: Tito Dine in Treatment: 1 Information Obtained from: Patient Chief Complaint Patient presents to the wound care center for a consult due non healing wound to his right elbow, left knee, left upper arm and left upper back which she's had for about a week Electronic Signature(s) Signed: 06/10/2016 4:33:23 PM By: Linton Ham MD Entered By: Linton Ham on 06/10/2016 12:00:01 [place], [name] ([id number]) -------------------------------------------------------------------------------- HPI Details Patient Name: [name], [name] Date of Service: 06/10/2016 10:15 AM Medical Record Number: [id number] Patient Account Number: [id number] Date of Birth/Sex: 1923/07/11 (83 y.o. Male) Treating RN: Montey Hora Primary Care Provider: [facility], Dossie Der Other Clinician: Referring Provider: [facility], [facility] Treating Provider/Extender: Tito Dine in Treatment: 1 History of Present Illness Location: right elbow, left knee, left upper arm and left upper back Quality: Patient reports experiencing a dull pain to affected area(s). Severity: Patient states wound (s) are getting better. Duration: Patient has had the wound for < 1 weeks prior to presenting for treatment Timing: Pain in wound is Intermittent (comes and goes Context: The wound occurred when the patient had a fall and hurt his back Modifying Factors: Other treatment(s) tried include:local care with Neosporin ointment Associated Signs and Symptoms: Patient reports having:scar tissue opens out every time they do dressing HPI  Description: 83 year old patient who was in [place] in May a couple of times for a lacerated wound which was healed over a couple of weeks. He now comes with recent injuries to his back and knees and both arms sustained after a fall. He did not seek any medical help and they use dressing material which was previously there. Past medical history significant for carotid stenosis, [name], [name], dyslipidemia, hypertension, anxiety, GERD, multiple myeloma, BPH, inguinal hernia and is status post appendectomy, hip fracture, cardiac catheterization. He smoked in the remote past and has not smoked for several years. He has had long-term low back pain and was evaluated for pain radiating to the left groin. 02-06-16 Mr. [name] progress for evaluation of multiple skin tears, he is accompanied by his wife. Neither patient nor his wife expressed the concerns regarding his wounds and/or the treatment plan. They deny any falls since his last appointment or any new injuries. READMISSION this is a 83 year old man who is been seen in the clinic in 2017 for multiple skin tears after falling. These were fairly easy to close over unfortunately the patient's recent course is not gone well. He fell and suffered a right hip fracture at the beginning of March. He was discharged to a skilled facility and sometime during the stay at the skilled facility his daughter noted a bandage on his left elbow. It wasn't told later that they learned he had fallen out of bed sustaining an injury. After discharge a week ago he apparently had a off- balance issue falling against the wall and hit the left elbow again. Been using peroxide and Neosporin. Seen by the family doctor yesterday and x-ray of the left elbow was negative. He was put on doxycycline. The patient has a history of coronary artery disease status post stent, [name], hypertension, lumbar spinal stenosis, multiple myeloma and BPH. He is apparently had bilateral  hip replacements secondary to falling with the most recent one being on the right. He states that on the times he has fallen he has got up to go to the bathroom and becomes "swimmy headed" although he does not describe the off-balance sensation were reproducibly with changes in position 06/10/16; left elbow wounds look improved this is on the posterior aspect over the olecranon area. Healthy- [name], [name] ([id number]) looking granulation. The Prisma apparently stuck to the wound bed at least as being applied by home health. We changed to Hydrofera Blue Electronic Signature(s) Signed: 06/10/2016 4:33:23 PM By: Robson, Michael MD Entered By: Robson, Michael on 06/10/2016 12:02:45 [name], [name] ([id number]) -------------------------------------------------------------------------------- Physical Exam Details Patient Name: [name], [name] Date of Service: 06/10/2016 10:15 AM Medical Record Number: [id number] Patient Account Number: [id number] Date of Birth/Sex: 05/24/1923 (83 y.o. Male) Treating RN: Dorthy, Joanna Primary Care Provider: [facility], [facility] Other Clinician: Referring Provider: [facility], [facility] Treating Provider/Extender: ROBSON, MICHAEL G Weeks in Treatment: 1 Constitutional Wide pulse pressure however the patient appears stable. Pulse regular and within target range for patient.. Respirations regular, non-labored and within target range.. Temperature is normal and within the target range for the patient.. Patient's appearance is neat and clean. Appears in no acute distress. Well nourished and well developed.. Eyes Conjunctivae clear. No discharge.. Respiratory Respiratory effort is easy and symmetric bilaterally. Rate is normal at rest and on room air.. Bilateral breath sounds are clear and equal in all lobes with no wheezes, rales or rhonchi.. Cardiovascular Heart rhythm and rate regular, without murmur or gallop.. Gastrointestinal (GI) Abdomen is soft and non-distended without masses  or tenderness. Bowel sounds active in all quadrants.. Lymphatic None palpable in the troches clear or axilla on the left. Notes Wound exam; he has 3 remaining wounds on the left elbow. The area over the right elbow has closed. The wounds look healthy with advancing epithelialization no debridement was required Electronic Signature(s) Signed: 06/10/2016 4:33:23 PM By: Robson, Michael MD Entered By: Robson, Michael on 06/10/2016 12:05:10 [name], [name] ([id number]) -------------------------------------------------------------------------------- Physician Orders Details Patient Name: [name], [name] Date of Service: 06/10/2016 10:15 AM Medical Record Number: [id number] Patient Account Number: [id number] Date of Birth/Sex: 06/04/1923 (83 y.o. Male) Treating RN: Dorthy, Joanna Primary Care Provider: [facility], [facility] Other Clinician: Referring Provider: [facility], [facility] Treating Provider/Extender: ROBSON, MICHAEL G Weeks in Treatment: 1 Verbal / Phone Orders: No Diagnosis Coding Wound Cleansing Wound #8 Left,Lateral Upper Arm o Clean wound with wound cleanser. Anesthetic Wound #8 Left,Lateral Upper Arm o Topical Lidocaine 4% cream applied to wound bed prior to debridement Primary Wound Dressing Wound #8 Left,Lateral Upper Arm o Prisma Ag Secondary Dressing Wound #8 Left,Lateral Upper Arm o Conform/Kerlix o Coban - secure lightly with coban - do not wrap tight o Non-adherent pad - MUST USE NON ADHERENT PAD Dressing Change Frequency Wound #8 Left,Lateral Upper Arm o Three times weekly - Once in clinic Follow-up Appointments Wound #8 Left,Lateral Upper Arm o Return Appointment in 1 week. Home Health Wound #8 Left,Lateral Upper Arm o Continue [facility] Visits - [name] o Home Health Nurse may visit PRN to address patientos wound care needs. o FACE TO FACE ENCOUNTER: MEDICARE and MEDICAID PATIENTS: I certify that this patient is under my care and that I had a face-to-face  encounter that meets the physician face-to-face encounter requirements with this patient on this date. The encounter with the patient was in whole or in part for the following MEDICAL CONDITION: (primary reason for Home Healthcare) MEDICAL   NECESSITY: I certify, that based on my findings, NURSING services are a medically [place], [place]. ([id number]) necessary home health service. HOME BOUND STATUS: I certify that my clinical findings support that this patient is homebound (i.e., Due to illness or injury, pt requires aid of supportive devices such as crutches, cane, wheelchairs, walkers, the use of special transportation or the assistance of another person to leave their place of residence. There is a normal inability to leave the home and doing so requires considerable and taxing effort. Other absences are for medical reasons / religious services and are infrequent or of short duration when for other reasons). o If current dressing causes regression in wound condition, may D/C ordered dressing product/s and apply Normal Saline Moist Dressing daily until next [place] / Other MD appointment. [place] of regression in wound condition at [phone]. o Please direct any NON-WOUND related issues/requests for orders to patient's Primary Care Physician Electronic Signature(s) Signed: 06/10/2016 4:33:23 PM By: Linton Ham MD Signed: 06/10/2016 5:41:54 PM By: Montey Hora Entered By: Montey Hora on 06/10/2016 10:53:36 [place], [place]. ([id number]) -------------------------------------------------------------------------------- Problem List Details Patient Name: Fitzsimmons, Jaice L. Date of Service: 06/10/2016 10:15 AM Medical Record Number: [id number] Patient Account Number: [id number] Date of Birth/Sex: [birth year] (83 y.o. Male) Treating RN: Montey Hora Primary Care Provider: [facility], Dossie Der Other Clinician: Referring Provider: [facility], [facility] Treating Provider/Extender:  Tito Dine in Treatment: 1 Active Problems ICD-10 Encounter Code Description Active Date Diagnosis S41.112D Laceration without foreign body of left upper arm, 06/02/2016 Yes subsequent encounter S41.111D Laceration without foreign body of right upper arm, 06/02/2016 Yes subsequent encounter I95.2 Hypotension due to drugs 06/02/2016 Yes Inactive Problems Resolved Problems Electronic Signature(s) Signed: 06/10/2016 4:33:23 PM By: Linton Ham MD Entered By: Linton Ham on 06/10/2016 11:57:02 [name], [name] ([id number]) -------------------------------------------------------------------------------- Progress Note Details Patient Name: [name], [name] Date of Service: 06/10/2016 10:15 AM Medical Record Number: [id number] Patient Account Number: [id number] Date of Birth/Sex: 1923/11/12 (83 y.o. Male) Treating RN: Montey Hora Primary Care Provider: [facility], [facility] Other Clinician: Referring Provider: [facility], [facility] Treating Provider/Extender: Tito Dine in Treatment: 1 Subjective Chief Complaint Information obtained from Patient Patient presents to the wound care center for a consult due non healing wound to his right elbow, left knee, left upper arm and left upper back which she's had for about a week History of Present Illness (HPI) The following HPI elements were documented for the patient's wound: Location: right elbow, left knee, left upper arm and left upper back Quality: Patient reports experiencing a dull pain to affected area(s). Severity: Patient states wound (s) are getting better. Duration: Patient has had the wound for < 1 weeks prior to presenting for treatment Timing: Pain in wound is Intermittent (comes and goes Context: The wound occurred when the patient had a fall and hurt his back Modifying Factors: Other treatment(s) tried include:local care with Neosporin ointment Associated Signs and Symptoms: Patient reports having:scar tissue opens out  every time they do dressing 83 year old patient who was in [place] in May a couple of times for a lacerated wound which was healed over a couple of weeks. He now comes with recent injuries to his back and knees and both arms sustained after a fall. He did not seek any medical help and they use dressing material which was previously there. Past medical history significant for carotid stenosis, [name], [name], dyslipidemia, hypertension, anxiety, GERD, multiple myeloma, BPH, inguinal hernia and is status post appendectomy, hip  fracture, cardiac catheterization. He smoked in the remote past and has not smoked for several years. He has had long-term low back pain and was evaluated for pain radiating to the left groin. 02-06-16 Mr. Fancher progress for evaluation of multiple skin tears, he is accompanied by his wife. Neither patient nor his wife expressed the concerns regarding his wounds and/or the treatment plan. They deny any falls since his last appointment or any new injuries. READMISSION this is a 89-year-old man who is been seen in the clinic in 2017 for multiple skin tears after falling. These were fairly easy to close over unfortunately the patient's recent course is not gone well. He fell and suffered a right hip fracture at the beginning of March. He was discharged to a skilled facility and sometime during the stay at the skilled facility his daughter noted a bandage on his left elbow. It wasn't told later that they learned he had fallen out of bed sustaining an injury. After discharge a week ago he apparently had a off- balance issue falling against the wall and hit the left elbow again. Been using peroxide and Neosporin. Seen by the family doctor yesterday and x-ray of the left elbow was negative. He was put on doxycycline. [name], [name] ([id number]) The patient has a history of coronary artery disease status post stent, [name], hypertension, lumbar spinal stenosis, multiple myeloma  and BPH. He is apparently had bilateral hip replacements secondary to falling with the most recent one being on the right. He states that on the times he has fallen he has got up to go to the bathroom and becomes "swimmy headed" although he does not describe the off-balance sensation were reproducibly with changes in position 06/10/16; left elbow wounds look improved this is on the posterior aspect over the olecranon area. Healthy- looking granulation. The Prisma apparently stuck to the wound bed at least as being applied by home health. We changed to Hydrofera Blue Objective Constitutional Wide pulse pressure however the patient appears stable. Pulse regular and within target range for patient.. Respirations regular, non-labored and within target range.. Temperature is normal and within the target range for the patient.. Patient's appearance is neat and clean. Appears in no acute distress. Well nourished and well developed.. Vitals Time Taken: 10:16 AM, Height: 71 in, Weight: 183 lbs, BMI: 25.5, Temperature: 97.4 °F, Pulse: 74 bpm, Respiratory Rate: 16 breaths/min, Blood Pressure: 111/48 mmHg. Eyes Conjunctivae clear. No discharge.. Respiratory Respiratory effort is easy and symmetric bilaterally. Rate is normal at rest and on room air.. Bilateral breath sounds are clear and equal in all lobes with no wheezes, rales or rhonchi.. Cardiovascular Heart rhythm and rate regular, without murmur or gallop.. Gastrointestinal (GI) Abdomen is soft and non-distended without masses or tenderness. Bowel sounds active in all quadrants.. Lymphatic None palpable in the troches clear or axilla on the left. General Notes: Wound exam; he has 3 remaining wounds on the left elbow. The area over the right elbow has closed. The wounds look healthy with advancing epithelialization no debridement was required Integumentary (Hair, Skin) Wound #7 status is Open. Original cause of wound was Trauma. The wound is  located on the Right,Lateral Upper Arm. The wound measures 0cm length x 0cm width x 0cm depth; 0cm^2 area and 0cm^3 volume. [name], [name] ([id number]) Wound #8 status is Open. Original cause of wound was Trauma. The wound is located on the Left,Lateral Upper Arm. The wound measures 10.7cm length x 5.5cm width x 0.1cm depth; 46.221cm^2 area   and 4.622cm^3 volume. The wound is limited to skin breakdown. There is no tunneling or undermining noted. There is a large amount of serosanguineous drainage noted. The wound margin is flat and intact. There is large (67-100%) pink granulation within the wound bed. There is no necrotic tissue within the wound bed. The periwound skin appearance did not exhibit: Callus, Crepitus, Excoriation, Induration, Rash, Scarring, Dry/Scaly, Maceration, Atrophie Blanche, Cyanosis, Ecchymosis, Hemosiderin Staining, Mottled, Pallor, Rubor, Erythema. Assessment Active Problems ICD-10 S41.112D - Laceration without foreign body of left upper arm, subsequent encounter S41.111D - Laceration without foreign body of right upper arm, subsequent encounter I95.2 - Hypotension due to drugs Plan Wound Cleansing: Wound #8 Left,Lateral Upper Arm: Clean wound with wound cleanser. Anesthetic: Wound #8 Left,Lateral Upper Arm: Topical Lidocaine 4% cream applied to wound bed prior to debridement Primary Wound Dressing: Wound #8 Left,Lateral Upper Arm: Prisma Ag Secondary Dressing: Wound #8 Left,Lateral Upper Arm: Conform/Kerlix Coban - secure lightly with coban - do not wrap tight Non-adherent pad - MUST USE NON ADHERENT PAD Dressing Change Frequency: Wound #8 Left,Lateral Upper Arm: Three times weekly - Once in clinic Follow-up Appointments: Wound #8 Left,Lateral Upper Arm: Return Appointment in 1 week. Home Health: [name], [name] ([id number]) Wound #8 Left,Lateral Upper Arm: Continue Home Health Visits - [name] Nurse may visit PRN to address patient s wound  care needs. FACE TO FACE ENCOUNTER: MEDICARE and MEDICAID PATIENTS: I certify that this patient is under my care and that I had a face-to-face encounter that meets the physician face-to-face encounter requirements with this patient on this date. The encounter with the patient was in whole or in part for the following MEDICAL CONDITION: (primary reason for Home Healthcare) MEDICAL NECESSITY: I certify, that based on my findings, NURSING services are a medically necessary home health service. HOME BOUND STATUS: I certify that my clinical findings support that this patient is homebound (i.e., Due to illness or injury, pt requires aid of supportive devices such as crutches, cane, wheelchairs, walkers, the use of special transportation or the assistance of another person to leave their place of residence. There is a normal inability to leave the home and doing so requires considerable and taxing effort. Other absences are for medical reasons / religious services and are infrequent or of short duration when for other reasons). If current dressing causes regression in wound condition, may D/C ordered dressing product/s and apply Normal Saline Moist Dressing daily until next Wound Healing Center / Other MD appointment. Notify Wound Healing Center of regression in wound condition at [phone]. Please direct any NON-WOUND related issues/requests for orders to patient's Primary Care Physician Continue Prisma with a nonadherent had under Kerlix Coban progressive closure here. Wounds look satisfactory Electronic Signature(s) Signed: 06/10/2016 12:05:54 PM By: Robson, Michael MD Entered By: Robson, Michael on 06/10/2016 12:05:54 [name], [name] ([id number]) -------------------------------------------------------------------------------- SuperBill Details Patient Name: [name], [name] Date of Service: 06/10/2016 Medical Record Number: [id number] Patient Account Number: [id number] Date of Birth/Sex: 08/13/1923  (83 y.o. Male) Treating RN: Dorthy, Joanna Primary Care Provider: [facility], [facility] Other Clinician: Referring Provider: [facility], [facility] Treating Provider/Extender: ROBSON, MICHAEL G Weeks in Treatment: 1 Diagnosis Coding ICD-10 Codes Code Description S41.112D Laceration without foreign body of left upper arm, subsequent encounter S41.111D Laceration without foreign body of right upper arm, subsequent encounter I95.2 Hypotension due to drugs Facility Procedures CPT4 Code: [id number] Description: 99213 - WOUND CARE VISIT-LEV 3 EST PT Modifier: Quantity: 1 Physician Procedures CPT4 Code Description: [id number] 99213 - WC PHYS   LEVEL 3 - EST PT ICD-10 Description Diagnosis S41.112D Laceration without foreign body of left upper arm Modifier: , subsequent e Quantity: 1 ncounter Electronic Signature(s) Signed: 06/10/2016 4:33:23 PM By: Robson, Michael MD Entered By: Robson, Michael on 06/10/2016 12:06:09 

## 2016-06-11 NOTE — Progress Notes (Signed)
Brandon, [name] ([id number]) Visit Report for 06/10/2016 Arrival Information Details Patient Name: [name], [name] Date of Service: 06/10/2016 10:15 AM Medical Record Number: [id number] Patient Account Number: [id number] Date of Birth/Sex: 07/18/1923 (83 y.o. Male) Treating RN: Montey Hora Primary Care Neoma Uhrich: [facility], [facility] Other Clinician: Referring Marlowe Cinquemani: [facility], [place] Treating Lonnie Reth/Extender: Tito Dine in Treatment: 1 Visit Information History Since Last Visit Added or deleted any medications: No Patient Arrived: Walker Any new allergies or adverse reactions: No Arrival Time: 10:15 Had a fall or experienced change in No Accompanied By: spouse activities of daily living that may affect Transfer Assistance: None risk of falls: Patient Identification Verified: Yes Signs or symptoms of abuse/neglect since last No Secondary Verification Process Completed: Yes visito Hospitalized since last visit: No Has Dressing in Place as Prescribed: Yes Pain Present Now: No Electronic Signature(s) Signed: 06/10/2016 5:41:54 PM By: Montey Hora Entered By: Montey Hora on 06/10/2016 10:15:34 Collinsworth, Brandn L. ([id number]) -------------------------------------------------------------------------------- Clinic Level of Care Assessment Details Patient Name: Brandon, [name] Date of Service: 06/10/2016 10:15 AM Medical Record Number: [id number] Patient Account Number: [id number] Date of Birth/Sex: [birth year] (83 y.o. Male) Treating RN: Montey Hora Primary Care Isha Seefeld: [facility], [facility] Other Clinician: Referring Kyri Shader: [facility], [facility] Treating Jaymari Cromie/Extender: Tito Dine in Treatment: 1 Clinic Level of Care Assessment Items TOOL 4 Quantity Score []  - Use when only an EandM is performed on FOLLOW-UP visit 0 ASSESSMENTS - Nursing Assessment / Reassessment X - Reassessment of Co-morbidities (includes updates in patient status) 1 10 X - Reassessment of Adherence to Treatment  Plan 1 5 ASSESSMENTS - Wound and Skin Assessment / Reassessment []  - Simple Wound Assessment / Reassessment - one wound 0 X - Complex Wound Assessment / Reassessment - multiple wounds 2 5 []  - Dermatologic / Skin Assessment (not related to wound area) 0 ASSESSMENTS - Focused Assessment []  - Circumferential Edema Measurements - multi extremities 0 []  - Nutritional Assessment / Counseling / Intervention 0 []  - Lower Extremity Assessment (monofilament, tuning fork, pulses) 0 []  - Peripheral Arterial Disease Assessment (using hand held doppler) 0 ASSESSMENTS - Ostomy and/or Continence Assessment and Care []  - Incontinence Assessment and Management 0 []  - Ostomy Care Assessment and Management (repouching, etc.) 0 PROCESS - Coordination of Care X - Simple Patient / Family Education for ongoing care 1 15 []  - Complex (extensive) Patient / Family Education for ongoing care 0 []  - Staff obtains Programmer, systems, Records, Test Results / Process Orders 0 []  - Staff telephones HHA, Nursing Homes / Clarify orders / etc 0 []  - Routine Transfer to another Facility (non-emergent condition) 0 Utecht, Grier L. ([id number]) []  - Routine Hospital Admission (non-emergent condition) 0 []  - New Admissions / Biomedical engineer / Ordering NPWT, Apligraf, etc. 0 []  - Emergency Hospital Admission (emergent condition) 0 X - Simple Discharge Coordination 1 10 []  - Complex (extensive) Discharge Coordination 0 PROCESS - Special Needs []  - Pediatric / Minor Patient Management 0 []  - Isolation Patient Management 0 []  - Hearing / Language / Visual special needs 0 []  - Assessment of Community assistance (transportation, D/C planning, etc.) 0 []  - Additional assistance / Altered mentation 0 []  - Support Surface(s) Assessment (bed, cushion, seat, etc.) 0 INTERVENTIONS - Wound Cleansing / Measurement []  - Simple Wound Cleansing - one wound 0 X - Complex Wound Cleansing - multiple wounds 2 5 X - Wound Imaging (photographs -  any number of wounds) 1 5 []  - Wound Tracing (instead of photographs) 0 []  -  Simple Wound Measurement - one wound 0 X - Complex Wound Measurement - multiple wounds 2 5 INTERVENTIONS - Wound Dressings []  - Small Wound Dressing one or multiple wounds 0 X - Medium Wound Dressing one or multiple wounds 1 15 []  - Large Wound Dressing one or multiple wounds 0 []  - Application of Medications - topical 0 []  - Application of Medications - injection 0 INTERVENTIONS - Miscellaneous []  - External ear exam 0 Faniel, Jiovanni L. ([id number]) []  - Specimen Collection (cultures, biopsies, blood, body fluids, etc.) 0 []  - Specimen(s) / Culture(s) sent or taken to Lab for analysis 0 []  - Patient Transfer (multiple staff / Harrel Lemon Lift / Similar devices) 0 []  - Simple Staple / Suture removal (25 or less) 0 []  - Complex Staple / Suture removal (26 or more) 0 []  - Hypo / Hyperglycemic Management (close monitor of Blood Glucose) 0 []  - Ankle / Brachial Index (ABI) - do not check if billed separately 0 X - Vital Signs 1 5 Has the patient been seen at the hospital within the last three years: Yes Total Score: 95 Level Of Care: New/Established - Level 3 Electronic Signature(s) Signed: 06/10/2016 5:41:54 PM By: Montey Hora Entered By: Montey Hora on 06/10/2016 11:51:43 [place], Petro L. ([id number]) -------------------------------------------------------------------------------- Encounter Discharge Information Details Patient Name: Brandon, [name] Date of Service: 06/10/2016 10:15 AM Medical Record Number: [id number] Patient Account Number: [id number] Date of Birth/Sex: [birth year] (83 y.o. Male) Treating RN: Montey Hora Primary Care Brettany Sydney: [facility], [facility] Other Clinician: Referring Lisa-Marie Rueger: [facility], [facility] Treating Kensy Blizard/Extender: Tito Dine in Treatment: 1 Encounter Discharge Information Items Discharge Pain Level: 0 Discharge Condition: Stable Ambulatory Status: Walker Discharge Destination:  Home Transportation: Private Auto Accompanied By: spouse Schedule Follow-up Appointment: Yes Medication Reconciliation completed No and provided to Patient/Care Betzaira Mentel: Provided on Clinical Summary of Care: 06/10/2016 Form Type Recipient Paper Patient AS Electronic Signature(s) Signed: 06/10/2016 11:52:47 AM By: Montey Hora Previous Signature: 06/10/2016 11:01:25 AM Version By: Ruthine Dose Entered By: Montey Hora on 06/10/2016 11:52:47 [place], Lutz ([id number]) -------------------------------------------------------------------------------- Multi Wound Chart Details Patient Name: Hinnant, Gamaliel L. Date of Service: 06/10/2016 10:15 AM Medical Record Number: [id number] Patient Account Number: [id number] Date of Birth/Sex: 05-28-1923 (83 y.o. Male) Treating RN: Montey Hora Primary Care Luretha Eberly: [facility], [facility] Other Clinician: Referring Rosco Harriott: [facility], [facility] Treating Promise Weldin/Extender: Tito Dine in Treatment: 1 Vital Signs Height(in): 71 Pulse(bpm): 74 Weight(lbs): 183 Blood Pressure 111/48 (mmHg): Body Mass Index(BMI): 26 Temperature(F): 97.4 Respiratory Rate 16 (breaths/min): Photos: [N/A:N/A] Wound Location: Right, Lateral Upper Arm Left Upper Arm - Lateral N/A Wounding Event: Trauma Trauma N/A Primary Etiology: Trauma, Other Trauma, Other N/A Comorbid History: N/A Cataracts, Chronic sinus N/A problems/congestion, Sleep Apnea, Coronary Artery Disease, Osteoarthritis, Received Chemotherapy Date Acquired: 05/13/2016 05/19/2016 N/A Weeks of Treatment: 1 1 N/A Wound Status: Open Open N/A Clustered Wound: No Yes N/A Clustered Quantity: N/A 4 N/A Measurements L x W x D 0x0x0 10.7x5.5x0.1 N/A (cm) Area (cm) : 0 46.221 N/A Volume (cm) : 0 4.622 N/A % Reduction in Area: 100.00% 23.60% N/A % Reduction in Volume: 100.00% 63.60% N/A Classification: Partial Thickness Partial Thickness N/A Exudate Amount: N/A Large N/A Exudate Type: N/A Serosanguineous  N/A Yasin, Antawn L. ([id number]) Exudate Color: N/A red, brown N/A Wound Margin: N/A Flat and Intact N/A Granulation Amount: N/A Large (67-100%) N/A Granulation Quality: N/A Pink N/A Necrotic Amount: N/A None Present (0%) N/A Epithelialization: N/A Small (1-33%) N/A Periwound Skin Texture: No Abnormalities Noted Excoriation: No N/A Induration: No  Callus: No Crepitus: No Rash: No Scarring: No Periwound Skin No Abnormalities Noted Maceration: No N/A Moisture: Dry/Scaly: No Periwound Skin Color: No Abnormalities Noted Atrophie Blanche: No N/A Cyanosis: No Ecchymosis: No Erythema: No Hemosiderin Staining: No Mottled: No Pallor: No Rubor: No Tenderness on No No N/A Palpation: Wound Preparation: N/A Ulcer Cleansing: N/A Rinsed/Irrigated with Saline Topical Anesthetic Applied: None Treatment Notes Wound #8 (Left, Lateral Upper Arm) 1. Cleansed with: Clean wound with Normal Saline 2. Anesthetic Topical Lidocaine 4% cream to wound bed prior to debridement 4. Dressing Applied: Prisma Ag 5. Secondary Dressing Applied Kerlix/Conform Non-Adherent pad 7. Secured with Other (specify in notes) Notes lightly with DARIS, HARKINS ([id number]) Electronic Signature(s) Signed: 06/10/2016 4:33:23 PM By: Linton Ham MD Entered By: Linton Ham on 06/10/2016 11:57:17 Lily, Biscoe ([id number]) -------------------------------------------------------------------------------- Multi-Disciplinary Care Plan Details Patient Name: Sargent, Daundre L. Date of Service: 06/10/2016 10:15 AM Medical Record Number: [id number] Patient Account Number: [id number] Date of Birth/Sex: 10-Jan-1924 (83 y.o. Male) Treating RN: Montey Hora Primary Care Eirik Schueler: [facility], Dossie Der Other Clinician: Referring Primrose Oler: [facility], [facility] Treating Jamiesha Victoria/Extender: Tito Dine in Treatment: 1 Active Inactive ` Abuse / Safety / Falls / Self Care Management Nursing Diagnoses: Impaired physical  mobility Potential for falls Goals: Patient will remain injury free Date Initiated: 06/02/2016 Target Resolution Date: 06/09/2016 Goal Status: Active Patient/caregiver will verbalize understanding of skin care regimen Date Initiated: 06/02/2016 Target Resolution Date: 06/09/2016 Goal Status: Active Interventions: Assess fall risk on admission and as needed Notes: ` Necrotic Tissue Nursing Diagnoses: Impaired tissue integrity related to necrotic/devitalized tissue Goals: Necrotic/devitalized tissue will be minimized in the wound bed Date Initiated: 06/02/2016 Target Resolution Date: 06/09/2016 Goal Status: Active Interventions: Assess patient pain level pre-, during and post procedure and prior to discharge Treatment Activities: Apply topical anesthetic as ordered : 06/02/2016 CAMERAN, PETTEY ([id number]) Notes: ` Orientation to the Wound Care Program Nursing Diagnoses: Knowledge deficit related to the wound healing center program Goals: Patient/caregiver will verbalize understanding of the [place] Program Date Initiated: 06/02/2016 Target Resolution Date: 06/09/2016 Goal Status: Active Interventions: Provide education on orientation to the wound center Notes: ` Soft Tissue Infection Nursing Diagnoses: Impaired tissue integrity Knowledge deficit related to disease process and management Potential for infection: soft tissue Goals: Patient will remain free of wound infection Date Initiated: 06/02/2016 Target Resolution Date: 06/09/2016 Goal Status: Active Signs and symptoms of infection will be recognized early to allow for prompt treatment Date Initiated: 06/02/2016 Target Resolution Date: 06/09/2016 Goal Status: Active Interventions: Assess signs and symptoms of infection every visit Notes: ` Wound/Skin Impairment Nursing Diagnoses: Impaired tissue integrity Goals: Ulcer/skin breakdown will heal within 14 weeks JADORE, VEALS ([id number]) Date Initiated:  06/02/2016 Target Resolution Date: 10/02/2016 Goal Status: Active Interventions: Assess ulceration(s) every visit Notes: Electronic Signature(s) Signed: 06/10/2016 5:41:54 PM By: Montey Hora Entered By: Montey Hora on 06/10/2016 10:52:07 [place], Kody L. ([id number]) -------------------------------------------------------------------------------- Pain Assessment Details Patient Name: Majchrzak, Kenyada L. Date of Service: 06/10/2016 10:15 AM Medical Record Number: [id number] Patient Account Number: [id number] Date of Birth/Sex: Oct 22, 1923 (83 y.o. Male) Treating RN: Montey Hora Primary Care Morgan Keinath: [facility], Dossie Der Other Clinician: Referring Devone Bonilla: [name], [facility] Treating Izan Miron/Extender: Tito Dine in Treatment: 1 Active Problems Location of Pain Severity and Description of Pain Patient Has Paino No Site Locations Pain Management and Medication Current Pain Management: Notes Topical or injectable lidocaine is offered to patient for acute pain when surgical debridement is performed. If needed, Patient is instructed to use  over the counter pain medication for the following 24-48 hours after debridement. Wound care MDs do not prescribed pain medications. Patient has chronic pain or uncontrolled pain. Patient has been instructed to make an appointment with their Primary Care Physician for pain management. Electronic Signature(s) Signed: 06/10/2016 5:41:54 PM By: Montey Hora Entered By: Montey Hora on 06/10/2016 10:15:42 Mariner, Leelan Carlean Jews ([id number]) -------------------------------------------------------------------------------- Patient/Caregiver Education Details Patient Name: Tatro, Jamarrius L. Date of Service: 06/10/2016 10:15 AM Medical Record Number: [id number] Patient Account Number: [id number] Date of Birth/Gender: 02-24-1923 (83 y.o. Male) Treating RN: Montey Hora Primary Care Physician: [facility], [facility] Other Clinician: Referring Physician: [facility], [facility] Treating  Physician/Extender: Tito Dine in Treatment: 1 Education Assessment Education Provided To: Patient and Caregiver Education Topics Provided Wound/Skin Impairment: Handouts: Other: wound care as ordered Methods: Explain/Verbal Responses: State content correctly Electronic Signature(s) Signed: 06/10/2016 5:41:54 PM By: Montey Hora Entered By: Montey Hora on 06/10/2016 11:53:01 Herling, Nakota L. ([id number]) -------------------------------------------------------------------------------- Wound Assessment Details Patient Name: Locicero, Danen L. Date of Service: 06/10/2016 10:15 AM Medical Record Number: [id number] Patient Account Number: [id number] Date of Birth/Sex: [birth year] (83 y.o. Male) Treating RN: Montey Hora Primary Care Konstance Happel: [facility], [facility] Other Clinician: Referring Jecenia Leamer: [facility], [facility] Treating Navjot Loera/Extender: Tito Dine in Treatment: 1 Wound Status Wound Number: 7 Primary Etiology: Trauma, Other Wound Location: Right, Lateral Upper Arm Wound Status: Open Wounding Event: Trauma Date Acquired: 05/13/2016 Weeks Of Treatment: 1 Clustered Wound: No Photos Photo Uploaded By: Montey Hora on 06/10/2016 11:39:45 Wound Measurements Length: (cm) 0 % Reduction Width: (cm) 0 % Reduction Depth: (cm) 0 Area: (cm) 0 Volume: (cm) 0 in Area: 100% in Volume: 100% Wound Description Classification: Partial Thickness Periwound Skin Texture Texture Color No Abnormalities Noted: No No Abnormalities Noted: No Moisture No Abnormalities Noted: No Electronic Signature(s) Signed: 06/10/2016 5:41:54 PM By: Melburn Popper, Sheryl LMarland Kitchen ([id number]) Entered By: Montey Hora on 06/10/2016 10:29:55 Mcweeney, Admir L. ([id number]) -------------------------------------------------------------------------------- Wound Assessment Details Patient Name: Willig, Jasmine L. Date of Service: 06/10/2016 10:15 AM Medical Record Number: [id number] Patient Account  Number: [id number] Date of Birth/Sex: 10-Jul-1923 (83 y.o. Male) Treating RN: Montey Hora Primary Care Kaili Castille: [facility], [facility] Other Clinician: Referring Lluvia Gwynne: [facility], [facility] Treating Gaelyn Tukes/Extender: Tito Dine in Treatment: 1 Wound Status Wound Number: 8 Primary Trauma, Other Etiology: Wound Location: Left Upper Arm - Lateral Wound Open Wounding Event: Trauma Status: Date Acquired: 05/19/2016 Comorbid Cataracts, Chronic sinus Weeks Of Treatment: 1 History: problems/congestion, Sleep Apnea, Clustered Wound: Yes Coronary Artery Disease, Osteoarthritis, Received Chemotherapy Photos Photo Uploaded By: Montey Hora on 06/10/2016 11:39:46 Wound Measurements Length: (cm) 10.7 Width: (cm) 5.5 Depth: (cm) 0.1 Clustered Quantity: 4 Area: (cm) 46.221 Volume: (cm) 4.622 % Reduction in Area: 23.6% % Reduction in Volume: 63.6% Epithelialization: Small (1-33%) Tunneling: No Undermining: No Wound Description Classification: Partial Thickness Foul Odor Afte Wound Margin: Flat and Intact Slough/Fibrino Exudate Amount: Large Exudate Type: Serosanguineous Exudate Color: red, brown r Cleansing: No No Wound Bed Granulation Amount: Large (67-100%) Exposed Structure Granulation Quality: Pink Fascia Exposed: No Coopman, Carmine L. ([id number]) Necrotic Amount: None Present (0%) Fat Layer (Subcutaneous Tissue) Exposed: No Tendon Exposed: No Muscle Exposed: No Joint Exposed: No Bone Exposed: No Limited to Skin Breakdown Periwound Skin Texture Texture Color No Abnormalities Noted: No No Abnormalities Noted: No Callus: No Atrophie Blanche: No Crepitus: No Cyanosis: No Excoriation: No Ecchymosis: No Induration: No Erythema: No Rash: No Hemosiderin Staining: No Scarring: No Mottled: No Pallor: No Moisture Rubor: No No Abnormalities Noted: No Dry /  Scaly: No Maceration: No Wound Preparation Ulcer Cleansing: Rinsed/Irrigated with Saline Topical Anesthetic  Applied: None Treatment Notes Wound #8 (Left, Lateral Upper Arm) 1. Cleansed with: Clean wound with Normal Saline 2. Anesthetic Topical Lidocaine 4% cream to wound bed prior to debridement 4. Dressing Applied: Prisma Ag 5. Secondary Dressing Applied Kerlix/Conform Non-Adherent pad 7. Secured with Other (specify in notes) Notes lightly with coban Electronic Signature(s) Signed: 06/10/2016 5:41:54 PM By: Montey Hora Entered By: Montey Hora on 06/10/2016 10:51:58 Kittrell, Lakeshore ([id number]) -------------------------------------------------------------------------------- Vitals Details Patient Name: Spacek, Nickolai L. Date of Service: 06/10/2016 10:15 AM Medical Record Number: [id number] Patient Account Number: [id number] Date of Birth/Sex: [birth year] (83 y.o. Male) Treating RN: Montey Hora Primary Care Viraat Vanpatten: [facility], [facility] Other Clinician: Referring Shuntell Foody: [facility], [facility] Treating Shela Esses/Extender: Tito Dine in Treatment: 1 Vital Signs Time Taken: 10:16 Temperature (F): 97.4 Height (in): 71 Pulse (bpm): 74 Weight (lbs): 183 Respiratory Rate (breaths/min): 16 Body Mass Index (BMI): 25.5 Blood Pressure (mmHg): 111/48 Reference Range: 80 - 120 mg / dl Electronic Signature(s) Signed: 06/10/2016 5:41:54 PM By: Montey Hora Entered By: Montey Hora on 06/10/2016 10:18:08

## 2016-06-15 ENCOUNTER — Ambulatory Visit (INDEPENDENT_AMBULATORY_CARE_PROVIDER_SITE_OTHER): Payer: Medicare Other | Admitting: Family Medicine

## 2016-06-15 ENCOUNTER — Encounter: Payer: Self-pay | Admitting: Family Medicine

## 2016-06-15 ENCOUNTER — Telehealth: Payer: Self-pay | Admitting: Family Medicine

## 2016-06-15 VITALS — BP 138/70 | HR 87 | Temp 97.4°F | Resp 16 | Ht 70.0 in | Wt 190.2 lb

## 2016-06-15 DIAGNOSIS — W19XXXA Unspecified fall, initial encounter: Secondary | ICD-10-CM | POA: Diagnosis not present

## 2016-06-15 DIAGNOSIS — I2510 Atherosclerotic heart disease of native coronary artery without angina pectoris: Secondary | ICD-10-CM | POA: Diagnosis not present

## 2016-06-15 DIAGNOSIS — R1032 Left lower quadrant pain: Secondary | ICD-10-CM

## 2016-06-15 DIAGNOSIS — S72001S Fracture of unspecified part of neck of right femur, sequela: Secondary | ICD-10-CM

## 2016-06-15 DIAGNOSIS — Y92009 Unspecified place in unspecified non-institutional (private) residence as the place of occurrence of the external cause: Secondary | ICD-10-CM | POA: Diagnosis not present

## 2016-06-15 NOTE — Telephone Encounter (Signed)
Patient was evaluated today, he had no pain on palpation in the left groin, range of motion is improving, he was asked to follow up if he has any pain or limitation of range of motion. I will order an x-ray of left hip to rule out fracture.

## 2016-06-15 NOTE — Telephone Encounter (Signed)
[name] from [name] [phone]): Pt was standing up yesterday around 10:00am and fell on his left side. Had minor skin tear. Have left hip grown pain which does not hurt with weight bearing position but does hurt during internal and external hip rotation. The EMS came out and helped him up but he did not go to the ER.

## 2016-06-15 NOTE — Progress Notes (Signed)
Name: [name]   MRN: [id number]    DOB: 05/17/1923   Date:06/15/2016       Progress Note  Subjective  Chief Complaint  Chief Complaint  Patient presents with  . Fall    paperwork needed to be filled out pt fell yesterday but was not injured    HPI  Pt. Presents for follow up on his progress after surgery for repair of right hip after he fell on concrete resulting in a fracture of the hip. He was ordered to undergo physical and rehabilitative therapy by the Orthopedic surgeon and has been using [name], working with his Nurse, adult. He seems to have made good progress as per wife, here to sign the paperwork for certification of therapy started on April 12th, 2018.  He reports falling yesterday at the time of physcial therapy when his walker turned over to the right as he tried to sit on it, fell on the floor hitting his right arm. He has pain in left groin area although he reprotedly fell to his right side. He reports no pain in right arm, no reports of head trauma.    Past Medical History:  Diagnosis Date  . Anxiety   . BPH (benign prostatic hypertrophy)   . Carotid stenosis   . Chronic back pain   . Coronary artery disease   . Dyslipidemia   . GERD (gastroesophageal reflux disease)   . Hx of [name]   . Hyperlipidemia   . Hypertension   . Inguinal hernia   . 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 Major depression   . Multiple myeloma ([place])   . Pleural effusion    left  . Sleep apnea     Past Surgical History:  Procedure Laterality Date  . ANTERIOR APPROACH HEMI HIP ARTHROPLASTY Right 04/16/2016   Procedure: ANTERIOR APPROACH HEMI HIP ARTHROPLASTY;  Surgeon: Hessie Knows, MD;  Location: [facility] ORS;  Service: Orthopedics;  Laterality: Right;  . APPENDECTOMY    . CARDIAC CATHETERIZATION  2002  . CARDIAC CATHETERIZATION  06/2013  . COLONOSCOPY    . FEMUR FRACTURE SURGERY Left    with rod  . HIP FRACTURE SURGERY    . JOINT REPLACEMENT     right  partial knee replacement    Family History  Problem Relation Age of Onset  . Heart disease Mother   . Stroke Mother   . Diabetes Father   . Dementia Father   . Stroke Sister   . Stroke Brother   . Stroke Sister   . Stroke Sister   . Stroke Brother   . Stroke Brother     Social History   Social History  . Marital status: Married    Spouse name: N/A  . Number of children: N/A  . Years of education: N/A   Occupational History  . Not on file.   Social History Main Topics  . Smoking status: Former Smoker    Packs/day: 2.00    Years: 7.00    Types: Cigarettes    Quit date: 09/17/1953  . Smokeless tobacco: Never Used  . Alcohol use No  . Drug use: No  . Sexual activity: Not Currently   Other Topics Concern  . Not on file   Social History Narrative  . No narrative on file     Current Outpatient Prescriptions:  .  acetaminophen (TYLENOL) 500 MG tablet, Take 2 tablets (1,000 mg total) by mouth every 6 (six) hours., Disp: 30 tablet,  Rfl: 0 .  aspirin EC 81 MG tablet, Take 1 tablet (81 mg total) by mouth daily., Disp: 90 tablet, Rfl: 3 .  clonazePAM (KLONOPIN) 0.5 MG tablet, Take 1 tablet (0.5 mg total) by mouth 3 (three) times daily as needed for anxiety., Disp: 90 tablet, Rfl: 2 .  diclofenac sodium (VOLTAREN) 1 % GEL, Apply 2 g topically 4 (four) times daily., Disp: 1 Tube, Rfl: 0 .  docusate sodium (COLACE) 100 MG capsule, Take 1 capsule (100 mg total) by mouth 2 (two) times daily., Disp: 10 capsule, Rfl: 0 .  doxazosin (CARDURA) 1 MG tablet, TAKE ONE TABLET BY MOUTH ONCE DAILY, Disp: 90 tablet, Rfl: 0 .  enoxaparin (LOVENOX) 40 MG/0.4ML injection, Inject 0.4 mLs (40 mg total) into the skin daily., Disp: 0 Syringe, Rfl:  .  isosorbide mononitrate (IMDUR) 30 MG 24 hr tablet, Take 1 tablet (30 mg total) by mouth daily., Disp: 90 tablet, Rfl: 1 .  metoprolol tartrate (LOPRESSOR) 25 MG tablet, Take 0.5 tablets (12.5 mg total) by mouth 2 (two) times daily., Disp: 180 tablet,  Rfl: 0 .  Multiple Vitamins-Minerals (PRESERVISION/LUTEIN PO), Take by mouth 2 (two) times daily., Disp: , Rfl:  .  nitroGLYCERIN (NITROSTAT) 0.4 MG SL tablet, Place 0.4 mg under the tongue every 5 (five) minutes as needed for chest pain., Disp: , Rfl:  .  ondansetron (ZOFRAN) 4 MG tablet, Take 1 tablet (4 mg total) by mouth every 6 (six) hours as needed for nausea., Disp: 20 tablet, Rfl: 0 .  oxyCODONE (OXY IR/ROXICODONE) 5 MG immediate release tablet, Take 1-2 tablets (5-10 mg total) by mouth every 3 (three) hours as needed for breakthrough pain., Disp: 30 tablet, Rfl: 0 .  pantoprazole (PROTONIX) 40 MG tablet, Take 1 tablet (40 mg total) by mouth daily., Disp: 90 tablet, Rfl: 0 .  polyethylene glycol (MIRALAX / GLYCOLAX) packet, Take 17 g by mouth daily., Disp: , Rfl:  .  pravastatin (PRAVACHOL) 40 MG tablet, TAKE ONE TABLET BY MOUTH ONCE DAILY, Disp: 90 tablet, Rfl: 0 .  sertraline (ZOLOFT) 100 MG tablet, Take 1.5 tablets (150 mg total) by mouth at bedtime., Disp: 45 tablet, Rfl: 1  Allergies  Allergen Reactions  . Sulfamethoxazole-Trimethoprim Nausea Only and Other (See Comments)  . Baclofen   . Nsaids     Avoids due to liver.     ROS  Please see history of present illness for complete discussion of ROS  Objective  Vitals:   06/15/16 1133  BP: 138/70  Pulse: 87  Resp: 16  Temp: 97.4 F (36.3 C)  SpO2: 92%  Weight: 190 lb 3 oz (86.3 kg)  Height: _0  (1.778 m)    Physical Exam  Constitutional: He is oriented to person, place, and time and well-developed, well-nourished, and in no distress.  Cardiovascular: Normal rate, regular rhythm and normal heart sounds.   Pulmonary/Chest: Effort normal and breath sounds normal. He has no wheezes.  Musculoskeletal:       Left hip: He exhibits no tenderness, no swelling and no deformity.  Neurological: He is alert and oriented to person, place, and time.  Psychiatric: Mood, memory, affect and judgment normal.  Nursing note and  vitals reviewed.       Assessment & Plan  1. Closed fracture of right hip requiring operative repair, sequela Completed Home Health plan of care, certified and signed.  2. Fall at home, initial encounter Patient is now poorly off the oxycodone which was prescribed for initial postop pain and  which was likely responsible for causing multiple falls, reviewed medication history with patient and his wife.  3. Groin pain, left Have ordered an x-ray of left hip to rule out fracture (unlikely).  Jerrold Haskell [facility] 06/15/2016 11:47 AM

## 2016-06-17 ENCOUNTER — Encounter: Payer: Medicare Other | Attending: Internal Medicine | Admitting: Internal Medicine

## 2016-06-17 DIAGNOSIS — F419 Anxiety disorder, unspecified: Secondary | ICD-10-CM | POA: Insufficient documentation

## 2016-06-17 DIAGNOSIS — G4730 Sleep apnea, unspecified: Secondary | ICD-10-CM | POA: Diagnosis not present

## 2016-06-17 DIAGNOSIS — I952 Hypotension due to drugs: Secondary | ICD-10-CM | POA: Insufficient documentation

## 2016-06-17 DIAGNOSIS — N400 Enlarged prostate without lower urinary tract symptoms: Secondary | ICD-10-CM | POA: Insufficient documentation

## 2016-06-17 DIAGNOSIS — M48061 Spinal stenosis, lumbar region without neurogenic claudication: Secondary | ICD-10-CM | POA: Diagnosis not present

## 2016-06-17 DIAGNOSIS — S41111D Laceration without foreign body of right upper arm, subsequent encounter: Secondary | ICD-10-CM | POA: Insufficient documentation

## 2016-06-17 DIAGNOSIS — K219 Gastro-esophageal reflux disease without esophagitis: Secondary | ICD-10-CM | POA: Insufficient documentation

## 2016-06-17 DIAGNOSIS — I2510 Atherosclerotic heart disease of native coronary artery without angina pectoris: Secondary | ICD-10-CM | POA: Diagnosis not present

## 2016-06-17 DIAGNOSIS — S41112D Laceration without foreign body of left upper arm, subsequent encounter: Secondary | ICD-10-CM | POA: Diagnosis present

## 2016-06-17 DIAGNOSIS — Z886 Allergy status to analgesic agent status: Secondary | ICD-10-CM | POA: Diagnosis not present

## 2016-06-17 DIAGNOSIS — Z87891 Personal history of nicotine dependence: Secondary | ICD-10-CM | POA: Insufficient documentation

## 2016-06-17 DIAGNOSIS — G510 Bell's palsy: Secondary | ICD-10-CM | POA: Insufficient documentation

## 2016-06-17 DIAGNOSIS — E785 Hyperlipidemia, unspecified: Secondary | ICD-10-CM | POA: Diagnosis not present

## 2016-06-17 DIAGNOSIS — X58XXXD Exposure to other specified factors, subsequent encounter: Secondary | ICD-10-CM | POA: Diagnosis not present

## 2016-06-17 DIAGNOSIS — I110 Hypertensive heart disease with heart failure: Secondary | ICD-10-CM | POA: Insufficient documentation

## 2016-06-19 NOTE — Progress Notes (Signed)
ODDIS, WESTLING ([id number]) Visit Report for 06/17/2016 Arrival Information Details Patient Name: [name], [name] Date of Service: 06/17/2016 2:30 PM Medical Record Number: [id number] Patient Account Number: [id number] Date of Birth/Sex: Jul 03, 1923 (83 y.o. Male) Treating RN: Montey Hora Primary Care Marki Frede: [facility], [facility] Other Clinician: Referring Venisa Frampton: [facility], [facility] Treating Lurlie Wigen/Extender: Tito Dine in Treatment: 2 Visit Information History Since Last Visit Added or deleted any medications: No Patient Arrived: Walker Any new allergies or adverse reactions: No Arrival Time: 14:41 Had a fall or experienced change in No Accompanied By: spouse activities of daily living that may affect Transfer Assistance: None risk of falls: Patient Identification Verified: Yes Signs or symptoms of abuse/neglect since last No Secondary Verification Process Completed: Yes visito Hospitalized since last visit: No Has Dressing in Place as Prescribed: Yes Pain Present Now: No Electronic Signature(s) Signed: 06/17/2016 4:53:48 PM By: Montey Hora Entered By: Montey Hora on 06/17/2016 14:41:25 [name], [name] ([id number]) -------------------------------------------------------------------------------- Clinic Level of Care Assessment Details Patient Name: [name], [name] Date of Service: 06/17/2016 2:30 PM Medical Record Number: [id number] Patient Account Number: [id number] Date of Birth/Sex: [birth year] (83 y.o. Male) Treating RN: Montey Hora Primary Care Whitman Meinhardt: [facility], [facility] Other Clinician: Referring Davied Nocito: [facility], [facility] Treating Graceland Wachter/Extender: Tito Dine in Treatment: 2 Clinic Level of Care Assessment Items TOOL 4 Quantity Score []  - Use when only an EandM is performed on FOLLOW-UP visit 0 ASSESSMENTS - Nursing Assessment / Reassessment X - Reassessment of Co-morbidities (includes updates in patient status) 1 10 X - Reassessment of Adherence to Treatment Plan 1  5 ASSESSMENTS - Wound and Skin Assessment / Reassessment X - Simple Wound Assessment / Reassessment - one wound 1 5 []  - Complex Wound Assessment / Reassessment - multiple wounds 0 []  - Dermatologic / Skin Assessment (not related to wound area) 0 ASSESSMENTS - Focused Assessment []  - Circumferential Edema Measurements - multi extremities 0 []  - Nutritional Assessment / Counseling / Intervention 0 []  - Lower Extremity Assessment (monofilament, tuning fork, pulses) 0 []  - Peripheral Arterial Disease Assessment (using hand held doppler) 0 ASSESSMENTS - Ostomy and/or Continence Assessment and Care []  - Incontinence Assessment and Management 0 []  - Ostomy Care Assessment and Management (repouching, etc.) 0 PROCESS - Coordination of Care X - Simple Patient / Family Education for ongoing care 1 15 []  - Complex (extensive) Patient / Family Education for ongoing care 0 []  - Staff obtains Programmer, systems, Records, Test Results / Process Orders 0 []  - Staff telephones HHA, Nursing Homes / Clarify orders / etc 0 []  - Routine Transfer to another Facility (non-emergent condition) 0 Reffett, Matej L. ([id number]) []  - Routine Hospital Admission (non-emergent condition) 0 []  - New Admissions / Biomedical engineer / Ordering NPWT, Apligraf, etc. 0 []  - Emergency Hospital Admission (emergent condition) 0 X - Simple Discharge Coordination 1 10 []  - Complex (extensive) Discharge Coordination 0 PROCESS - Special Needs []  - Pediatric / Minor Patient Management 0 []  - Isolation Patient Management 0 []  - Hearing / Language / Visual special needs 0 []  - Assessment of Community assistance (transportation, D/C planning, etc.) 0 []  - Additional assistance / Altered mentation 0 []  - Support Surface(s) Assessment (bed, cushion, seat, etc.) 0 INTERVENTIONS - Wound Cleansing / Measurement X - Simple Wound Cleansing - one wound 1 5 []  - Complex Wound Cleansing - multiple wounds 0 X - Wound Imaging (photographs - any  number of wounds) 1 5 []  - Wound Tracing (instead of photographs) 0 X -  Simple Wound Measurement - one wound 1 5 []  - Complex Wound Measurement - multiple wounds 0 INTERVENTIONS - Wound Dressings X - Small Wound Dressing one or multiple wounds 1 10 []  - Medium Wound Dressing one or multiple wounds 0 []  - Large Wound Dressing one or multiple wounds 0 []  - Application of Medications - topical 0 []  - Application of Medications - injection 0 INTERVENTIONS - Miscellaneous []  - External ear exam 0 [name], [name] ([id number]) []  - Specimen Collection (cultures, biopsies, blood, body fluids, etc.) 0 []  - Specimen(s) / Culture(s) sent or taken to Lab for analysis 0 []  - Patient Transfer (multiple staff / Harrel Lemon Lift / Similar devices) 0 []  - Simple Staple / Suture removal (25 or less) 0 []  - Complex Staple / Suture removal (26 or more) 0 []  - Hypo / Hyperglycemic Management (close monitor of Blood Glucose) 0 []  - Ankle / Brachial Index (ABI) - do not check if billed separately 0 X - Vital Signs 1 5 Has the patient been seen at the hospital within the last three years: Yes Total Score: 75 Level Of Care: New/Established - Level 2 Electronic Signature(s) Signed: 06/17/2016 4:53:48 PM By: Montey Hora Entered By: Montey Hora on 06/17/2016 16:15:06 [name], [name] ([id number]) -------------------------------------------------------------------------------- Encounter Discharge Information Details Patient Name: [name], [name] Date of Service: 06/17/2016 2:30 PM Medical Record Number: [id number] Patient Account Number: [id number] Date of Birth/Sex: Jan 16, 1924 (83 y.o. Male) Treating RN: Montey Hora Primary Care Javonn Gauger: [facility], [facility] Other Clinician: Referring Tranika Scholler: [facility], [facility] Treating Choya Tornow/Extender: Tito Dine in Treatment: 2 Encounter Discharge Information Items Discharge Pain Level: 0 Discharge Condition: Stable Ambulatory Status: Walker Discharge Destination:  Home Transportation: Private Auto Accompanied By: spouse Schedule Follow-up Appointment: Yes Medication Reconciliation completed No and provided to Patient/Care Eli Pattillo: Provided on Clinical Summary of Care: 06/17/2016 Form Type Recipient Paper Patient AS Electronic Signature(s) Signed: 06/17/2016 4:16:56 PM By: Montey Hora Previous Signature: 06/17/2016 3:14:37 PM Version By: Sharon Mt Entered By: Montey Hora on 06/17/2016 16:16:56 Sweetser, Ozil L. ([id number]) -------------------------------------------------------------------------------- Multi Wound Chart Details Patient Name: [name], [name] Date of Service: 06/17/2016 2:30 PM Medical Record Number: [id number] Patient Account Number: [id number] Date of Birth/Sex: Jan 26, 1924 (83 y.o. Male) Treating RN: Montey Hora Primary Care Melina Mosteller: [facility], Dossie Der Other Clinician: Referring Enriqueta Augusta: [facility], [facility] Treating Jeannie Mallinger/Extender: Tito Dine in Treatment: 2 Vital Signs Height(in): 71 Pulse(bpm): 87 Weight(lbs): 183 Blood Pressure 114/53 (mmHg): Body Mass Index(BMI): 26 Temperature(F): 97.9 Respiratory Rate 18 (breaths/min): Photos: [N/A:N/A] Wound Location: Left, Lateral Upper Arm N/A N/A Wounding Event: Trauma N/A N/A Primary Etiology: Trauma, Other N/A N/A Comorbid History: Cataracts, Chronic sinus N/A N/A problems/congestion, Sleep Apnea, Coronary Artery Disease, Osteoarthritis, Received Chemotherapy Date Acquired: 05/19/2016 N/A N/A Weeks of Treatment: 2 N/A N/A Wound Status: Open N/A N/A Clustered Wound: Yes N/A N/A Clustered Quantity: 4 N/A N/A Measurements L x W x D 1.1x1.2x0.1 N/A N/A (cm) Area (cm) : 1.037 N/A N/A Volume (cm) : 0.104 N/A N/A % Reduction in Area: 98.30% N/A N/A % Reduction in Volume: 99.20% N/A N/A Classification: Partial Thickness N/A N/A Exudate Amount: Large N/A N/A Exudate Type: Serosanguineous N/A N/A Michl, Hendricks L. ([id number]) Exudate Color: red, brown N/A  N/A Wound Margin: Flat and Intact N/A N/A Granulation Amount: Large (67-100%) N/A N/A Granulation Quality: Pink N/A N/A Necrotic Amount: None Present (0%) N/A N/A Exposed Structures: Fascia: No N/A N/A Fat Layer (Subcutaneous Tissue) Exposed: No Tendon: No Muscle: No Joint: No Bone: No Limited to  Skin Breakdown Epithelialization: Medium (34-66%) N/A N/A Periwound Skin Texture: Excoriation: No N/A N/A Induration: No Callus: No Crepitus: No Rash: No Scarring: No Periwound Skin Maceration: No N/A N/A Moisture: Dry/Scaly: No Periwound Skin Color: Atrophie Blanche: No N/A N/A Cyanosis: No Ecchymosis: No Erythema: No Hemosiderin Staining: No Mottled: No Pallor: No Rubor: No Tenderness on No N/A N/A Palpation: Wound Preparation: Ulcer Cleansing: N/A N/A Rinsed/Irrigated with Saline Topical Anesthetic Applied: None Treatment Notes Wound #8 (Left, Lateral Upper Arm) 1. Cleansed with: Clean wound with Normal Saline 2. Anesthetic Topical Lidocaine 4% cream to wound bed prior to debridement 4. Dressing Applied: Prisma Ag 5. Secondary Dressing Applied BRIGGS, EDELEN ([id number]) Bordered Foam Dressing Dry Gauze Electronic Signature(s) Signed: 06/17/2016 5:52:53 PM By: Linton Ham MD Previous Signature: 06/17/2016 4:53:48 PM Version By: Montey Hora Entered By: Linton Ham on 06/17/2016 17:33:48 [place], [place] ([id number]) -------------------------------------------------------------------------------- Multi-Disciplinary Care Plan Details Patient Name: [name], [name] Date of Service: 06/17/2016 2:30 PM Medical Record Number: [id number] Patient Account Number: [id number] Date of Birth/Sex: 01/01/1924 (83 y.o. Male) Treating RN: Montey Hora Primary Care Kelvis Berger: [facility], Dossie Der Other Clinician: Referring Renlee Floor: [name], [facility] Treating Leeah Politano/Extender: Tito Dine in Treatment: 2 Active Inactive ` Abuse / Safety / Falls / Self Care Management Nursing  Diagnoses: Impaired physical mobility Potential for falls Goals: Patient will remain injury free Date Initiated: 06/02/2016 Target Resolution Date: 06/09/2016 Goal Status: Active Patient/caregiver will verbalize understanding of skin care regimen Date Initiated: 06/02/2016 Target Resolution Date: 06/09/2016 Goal Status: Active Interventions: Assess fall risk on admission and as needed Notes: ` Necrotic Tissue Nursing Diagnoses: Impaired tissue integrity related to necrotic/devitalized tissue Goals: Necrotic/devitalized tissue will be minimized in the wound bed Date Initiated: 06/02/2016 Target Resolution Date: 06/09/2016 Goal Status: Active Interventions: Assess patient pain level pre-, during and post procedure and prior to discharge Treatment Activities: Apply topical anesthetic as ordered : 06/02/2016 JERMIE, HIPPE ([id number]) Notes: ` Orientation to the Wound Care Program Nursing Diagnoses: Knowledge deficit related to the wound healing center program Goals: Patient/caregiver will verbalize understanding of the [place] Date Initiated: 06/02/2016 Target Resolution Date: 06/09/2016 Goal Status: Active Interventions: Provide education on orientation to the wound center Notes: ` Soft Tissue Infection Nursing Diagnoses: Impaired tissue integrity Knowledge deficit related to disease process and management Potential for infection: soft tissue Goals: Patient will remain free of wound infection Date Initiated: 06/02/2016 Target Resolution Date: 06/09/2016 Goal Status: Active Signs and symptoms of infection will be recognized early to allow for prompt treatment Date Initiated: 06/02/2016 Target Resolution Date: 06/09/2016 Goal Status: Active Interventions: Assess signs and symptoms of infection every visit Notes: ` Wound/Skin Impairment Nursing Diagnoses: Impaired tissue integrity Goals: Ulcer/skin breakdown will heal within 14 weeks GAYNOR, FERRERAS  ([id number]) Date Initiated: 06/02/2016 Target Resolution Date: 10/02/2016 Goal Status: Active Interventions: Assess ulceration(s) every visit Notes: Electronic Signature(s) Signed: 06/17/2016 4:53:48 PM By: Montey Hora Entered By: Montey Hora on 06/17/2016 15:03:04 [name], [name] ([id number]) -------------------------------------------------------------------------------- Pain Assessment Details Patient Name: [name], [name] Date of Service: 06/17/2016 2:30 PM Medical Record Number: [id number] Patient Account Number: [id number] Date of Birth/Sex: [birth year] (83 y.o. Male) Treating RN: Montey Hora Primary Care Mykeal Carrick: [facility], [place]ossie Der Other Clinician: Referring Jem Castro: [facility], [facility] Treating Job Holtsclaw/Extender: Tito Dine in Treatment: 2 Active Problems Location of Pain Severity and Description of Pain Patient Has Paino No Site Locations Pain Management and Medication Current Pain Management: Notes Topical or injectable lidocaine is offered to patient for acute pain  when surgical debridement is performed. If needed, Patient is instructed to use over the counter pain medication for the following 24-48 hours after debridement. Wound care MDs do not prescribed pain medications. Patient has chronic pain or uncontrolled pain. Patient has been instructed to make an appointment with their Primary Care Physician for pain management. Electronic Signature(s) Signed: 06/17/2016 4:53:48 PM By: Montey Hora Entered By: Montey Hora on 06/17/2016 14:41:33 [place], TsaileMarland Kitchen ([id number]) -------------------------------------------------------------------------------- Patient/Caregiver Education Details Patient Name: [name], [name] Date of Service: 06/17/2016 2:30 PM Medical Record Number: [id number] Patient Account Number: [id number] Date of Birth/Gender: Jan 10, 1924 (83 y.o. Male) Treating RN: Montey Hora Primary Care Physician: [facility], [facility] Other Clinician: Referring Physician: [facility],  [facility] Treating Physician/Extender: Tito Dine in Treatment: 2 Education Assessment Education Provided To: Patient Education Topics Provided Wound/Skin Impairment: Handouts: Other: wound care as ordered Methods: Demonstration, Explain/Verbal Responses: State content correctly Electronic Signature(s) Signed: 06/17/2016 4:53:48 PM By: Montey Hora Entered By: Montey Hora on 06/17/2016 16:17:14 [name], [name] ([id number]) -------------------------------------------------------------------------------- Wound Assessment Details Patient Name: [name], [name] Date of Service: 06/17/2016 2:30 PM Medical Record Number: [id number] Patient Account Number: [id number] Date of Birth/Sex: 1923-12-01 (83 y.o. Male) Treating RN: Montey Hora Primary Care Pieper Kasik: [facility], [facility] Other Clinician: Referring Wendle Kina: [facility], [facility] Treating Itzia Cunliffe/Extender: Tito Dine in Treatment: 2 Wound Status Wound Number: 8 Primary Trauma, Other Etiology: Wound Location: Left, Lateral Upper Arm Wound Open Wounding Event: Trauma Status: Date Acquired: 05/19/2016 Comorbid Cataracts, Chronic sinus Weeks Of Treatment: 2 History: problems/congestion, Sleep Apnea, Clustered Wound: Yes Coronary Artery Disease, Osteoarthritis, Received Chemotherapy Photos Photo Uploaded By: Montey Hora on 06/17/2016 15:51:16 Wound Measurements Length: (cm) 1.1 Width: (cm) 1.2 Depth: (cm) 0.1 Clustered Quantity: 4 Area: (cm) 1.037 Volume: (cm) 0.104 % Reduction in Area: 98.3% % Reduction in Volume: 99.2% Epithelialization: Medium (34-66%) Tunneling: No Undermining: No Wound Description Classification: Partial Thickness Foul Odor Afte Wound Margin: Flat and Intact Slough/Fibrino Exudate Amount: Large Exudate Type: Serosanguineous Exudate Color: red, brown r Cleansing: No No Wound Bed Granulation Amount: Large (67-100%) Exposed Structure Granulation Quality: Pink Fascia Exposed:  No Weyman, Damico L. ([id number]) Necrotic Amount: None Present (0%) Fat Layer (Subcutaneous Tissue) Exposed: No Tendon Exposed: No Muscle Exposed: No Joint Exposed: No Bone Exposed: No Limited to Skin Breakdown Periwound Skin Texture Texture Color No Abnormalities Noted: No No Abnormalities Noted: No Callus: No Atrophie Blanche: No Crepitus: No Cyanosis: No Excoriation: No Ecchymosis: No Induration: No Erythema: No Rash: No Hemosiderin Staining: No Scarring: No Mottled: No Pallor: No Moisture Rubor: No No Abnormalities Noted: No Dry / Scaly: No Maceration: No Wound Preparation Ulcer Cleansing: Rinsed/Irrigated with Saline Topical Anesthetic Applied: None Treatment Notes Wound #8 (Left, Lateral Upper Arm) 1. Cleansed with: Clean wound with Normal Saline 2. Anesthetic Topical Lidocaine 4% cream to wound bed prior to debridement 4. Dressing Applied: Prisma Ag 5. Secondary Dressing Applied Bordered Foam Dressing Dry Gauze Electronic Signature(s) Signed: 06/17/2016 4:53:48 PM By: Montey Hora Entered By: Montey Hora on 06/17/2016 15:04:58 [place], [place] ([id number]) -------------------------------------------------------------------------------- Vitals Details Patient Name: [name], Shaheem L. Date of Service: 06/17/2016 2:30 PM Medical Record Number: [id number] Patient Account Number: [id number] Date of Birth/Sex: 12/30/1923 (83 y.o. Male) Treating RN: Montey Hora Primary Care Arlo Buffone: [facility], [facility] Other Clinician: Referring Khalila Buechner: [facility] Treating Winefred Hillesheim/Extender: Tito Dine in Treatment: 2 Vital Signs Time Taken: 14:44 Temperature (F): 97.9 Height (in): 71 Pulse (bpm): 87 Weight (lbs): 183 Respiratory Rate (breaths/min): 18 Body Mass Index (BMI): 25.5 Blood  Pressure (mmHg): 114/53 Reference Range: 80 - 120 mg / dl Electronic Signature(s) Signed: 06/17/2016 4:53:48 PM By: Montey Hora Entered By: Montey Hora on 06/17/2016  14:48:24

## 2016-06-19 NOTE — Progress Notes (Signed)
JOHATHAN, PROVINCE ([id number]) Visit Report for 06/17/2016 Chief Complaint Document Details Patient Name: Bair, Daisy L. Date of Service: 06/17/2016 2:30 PM Medical Record Number: [id number] Patient Account Number: [id number] Date of Birth/Sex: 04-17-1923 (83 y.o. Male) Treating RN: Montey Hora Primary Care Provider: [facility], Dossie Der Other Clinician: Referring Provider: [facility], [facility] Treating Provider/Extender: Tito Dine in Treatment: 2 Information Obtained from: Patient Chief Complaint Patient presents to the wound care center for a consult due non healing wound to his right elbow, left knee, left upper arm and left upper back which she's had for about a week Electronic Signature(s) Signed: 06/17/2016 5:52:53 PM By: Linton Ham MD Entered By: Linton Ham on 06/17/2016 17:33:56 [place], Bryn L. ([id number]) -------------------------------------------------------------------------------- HPI Details Patient Name: Spallone, Jalal L. Date of Service: 06/17/2016 2:30 PM Medical Record Number: [id number] Patient Account Number: [id number] Date of Birth/Sex: Sep 13, 1923 (83 y.o. Male) Treating RN: Montey Hora Primary Care Provider: [facility], Dossie Der Other Clinician: Referring Provider: [facility], [facility] Treating Provider/Extender: Tito Dine in Treatment: 2 History of Present Illness Location: right elbow, left knee, left upper arm and left upper back Quality: Patient reports experiencing a dull pain to affected area(s). Severity: Patient states wound (s) are getting better. Duration: Patient has had the wound for < 1 weeks prior to presenting for treatment Timing: Pain in wound is Intermittent (comes and goes Context: The wound occurred when the patient had a fall and hurt his back Modifying Factors: Other treatment(s) tried include:local care with Neosporin ointment Associated Signs and Symptoms: Patient reports having:scar tissue opens out every time they do dressing HPI Description:  83 year old patient who was in [place] in May a couple of times for a lacerated wound which was healed over a couple of weeks. He now comes with recent injuries to his back and knees and both arms sustained after a fall. He did not seek any medical help and they use dressing material which was previously there. Past medical history significant for carotid stenosis, [name], [name], dyslipidemia, hypertension, anxiety, GERD, multiple myeloma, BPH, inguinal hernia and is status post appendectomy, hip fracture, cardiac catheterization. He smoked in the remote past and has not smoked for several years. He has had long-term low back pain and was evaluated for pain radiating to the left groin. 02-06-16 Mr. Tinoco progress for evaluation of multiple skin tears, he is accompanied by his wife. Neither patient nor his wife expressed the concerns regarding his wounds and/or the treatment plan. They deny any falls since his last appointment or any new injuries. READMISSION this is a 83 year old man who is been seen in the clinic in 2017 for multiple skin tears after falling. These were fairly easy to close over unfortunately the patient's recent course is not gone well. He fell and suffered a right hip fracture at the beginning of March. He was discharged to a skilled facility and sometime during the stay at the skilled facility his daughter noted a bandage on his left elbow. It wasn't told later that they learned he had fallen out of bed sustaining an injury. After discharge a week ago he apparently had a off- balance issue falling against the wall and hit the left elbow again. Been using peroxide and Neosporin. Seen by the family doctor yesterday and x-ray of the left elbow was negative. He was put on doxycycline. The patient has a history of coronary artery disease status post stent, [name], hypertension, lumbar spinal stenosis, multiple myeloma and BPH. He is apparently had bilateral hip  replacements secondary to falling with the most recent one being on the right. He states that on the times he has fallen he has got up to go to the bathroom and becomes "swimmy headed" although he does not describe the off-balance sensation were reproducibly with changes in position 06/10/16; left elbow wounds look improved this is on the posterior aspect over the olecranon area. Healthy- Neumeister, Faizan L. ([id number]) looking granulation. The Prisma apparently stuck to the wound bed at least as being applied by home health. We changed to [facility]. 06/17/16; the areas over the left elbow look improved. There are 2 small open areas. Light surface debridement to remove surface eschar. Electronic Signature(s) Signed: 06/17/2016 5:52:53 PM By: Linton Ham MD Entered By: Linton Ham on 06/17/2016 17:35:50 Kline, Clarence ([id number]) -------------------------------------------------------------------------------- Physical Exam Details Patient Name: Sallas, Tyronn L. Date of Service: 06/17/2016 2:30 PM Medical Record Number: [id number] Patient Account Number: [id number] Date of Birth/Sex: 1923/07/31 (83 y.o. Male) Treating RN: Montey Hora Primary Care Provider: [facility], Dossie Der Other Clinician: Referring Provider: [facility], [facility] Treating Provider/Extender: Tito Dine in Treatment: 2 Constitutional Sitting or standing Blood Pressure is within target range for patient.. Pulse regular and within target range for patient.Marland Kitchen Respirations regular, non-labored and within target range.. Temperature is normal and within the target range for the patient.. Patient's appearance is neat and clean. Appears in no acute distress. Well nourished and well developed.. Notes Wound exam; 2 open areas healthy granulation. Using a #3 curet I removed eschar without finding any additional wound areas under these. Electronic Signature(s) Signed: 06/17/2016 5:52:53 PM By: Linton Ham MD Entered By: Linton Ham on 06/17/2016 17:36:33 [place], Niles LMarland Kitchen ([id number]) -------------------------------------------------------------------------------- Physician Orders Details Patient Name: Mohamud, Myson L. Date of Service: 06/17/2016 2:30 PM Medical Record Number: [id number] Patient Account Number: [id number] Date of Birth/Sex: [birth year] (83 y.o. Male) Treating RN: Montey Hora Primary Care Provider: [facility], Dossie Der Other Clinician: Referring Provider: [facility], [facility] Treating Provider/Extender: Tito Dine in Treatment: 2 Verbal / Phone Orders: No Diagnosis Coding Wound Cleansing Wound #8 Left,Lateral Upper Arm o Clean wound with wound cleanser. Anesthetic Wound #8 Left,Lateral Upper Arm o Topical Lidocaine 4% cream applied to wound bed prior to debridement Primary Wound Dressing Wound #8 Left,Lateral Upper Arm o Prisma Ag Secondary Dressing Wound #8 Left,Lateral Upper Arm o Dry Gauze o Boardered Foam Dressing Dressing Change Frequency Wound #8 Left,Lateral Upper Arm o Three times weekly - Once in clinic Follow-up Appointments Wound #8 Left,Lateral Upper Arm o Return Appointment in 1 week. Home Health Wound #8 Left,Lateral Upper Arm o [place] Visits - [place] Nurse may visit PRN to address patientos wound care needs. o FACE TO FACE ENCOUNTER: MEDICARE and MEDICAID PATIENTS: I certify that this patient is under my care and that I had a face-to-face encounter that meets the physician face-to-face encounter requirements with this patient on this date. The encounter with the patient was in whole or in part for the following MEDICAL CONDITION: (primary reason for [place]) MEDICAL NECESSITY: I certify, that based on my findings, NURSING services are a medically necessary home health service. HOME BOUND STATUS: I certify that my clinical findings Sear, Edcouch ([id number]) support that this patient is homebound (i.e., Due to illness or  injury, pt requires aid of supportive devices such as crutches, cane, wheelchairs, walkers, the use of special transportation or the assistance of another person to leave their place of residence. There is a normal inability to  leave the home and doing so requires considerable and taxing effort. Other absences are for medical reasons / religious services and are infrequent or of short duration when for other reasons). o If current dressing causes regression in wound condition, may D/C ordered dressing product/s and apply Normal Saline Moist Dressing daily until next [place] / Other MD appointment. [place] of regression in wound condition at [phone]. o Please direct any NON-WOUND related issues/requests for orders to patient's Primary Care Physician Electronic Signature(s) Signed: 06/17/2016 4:53:48 PM By: Montey Hora Signed: 06/17/2016 5:52:53 PM By: Linton Ham MD Entered By: Montey Hora on 06/17/2016 15:04:29 [place], Lonell L. ([id number]) -------------------------------------------------------------------------------- Problem List Details Patient Name: Baty, Kiev L. Date of Service: 06/17/2016 2:30 PM Medical Record Number: [id number] Patient Account Number: [id number] Date of Birth/Sex: 1923-09-18 (83 y.o. Male) Treating RN: Montey Hora Primary Care Provider: [facility], Dossie Der Other Clinician: Referring Provider: [facility], [facility] Treating Provider/Extender: Tito Dine in Treatment: 2 Active Problems ICD-10 Encounter Code Description Active Date Diagnosis S41.112D Laceration without foreign body of left upper arm, 06/02/2016 Yes subsequent encounter S41.111D Laceration without foreign body of right upper arm, 06/02/2016 Yes subsequent encounter I95.2 Hypotension due to drugs 06/02/2016 Yes Inactive Problems Resolved Problems Electronic Signature(s) Signed: 06/17/2016 5:52:53 PM By: Linton Ham MD Entered By: Linton Ham on  06/17/2016 17:33:29 [place], Kyandre L. ([id number]) -------------------------------------------------------------------------------- Progress Note Details Patient Name: Siharath, Chijioke L. Date of Service: 06/17/2016 2:30 PM Medical Record Number: [id number] Patient Account Number: [id number] Date of Birth/Sex: Aug 29, 1923 (83 y.o. Male) Treating RN: Montey Hora Primary Care Provider: [facility], [facility] Other Clinician: Referring Provider: [facility], [facility] Treating Provider/Extender: Tito Dine in Treatment: 2 Subjective Chief Complaint Information obtained from Patient Patient presents to the wound care center for a consult due non healing wound to his right elbow, left knee, left upper arm and left upper back which she's had for about a week History of Present Illness (HPI) The following HPI elements were documented for the patient's wound: Location: right elbow, left knee, left upper arm and left upper back Quality: Patient reports experiencing a dull pain to affected area(s). Severity: Patient states wound (s) are getting better. Duration: Patient has had the wound for < 1 weeks prior to presenting for treatment Timing: Pain in wound is Intermittent (comes and goes Context: The wound occurred when the patient had a fall and hurt his back Modifying Factors: Other treatment(s) tried include:local care with Neosporin ointment Associated Signs and Symptoms: Patient reports having:scar tissue opens out every time they do dressing 83 year old patient who was in [place] in May a couple of times for a lacerated wound which was healed over a couple of weeks. He now comes with recent injuries to his back and knees and both arms sustained after a fall. He did not seek any medical help and they use dressing material which was previously there. Past medical history significant for carotid stenosis, [name], [name], dyslipidemia, hypertension, anxiety, GERD, multiple myeloma, BPH, inguinal hernia and is  status post appendectomy, hip fracture, cardiac catheterization. He smoked in the remote past and has not smoked for several years. He has had long-term low back pain and was evaluated for pain radiating to the left groin. 02-06-16 Mr. Coronado progress for evaluation of multiple skin tears, he is accompanied by his wife. Neither patient nor his wife expressed the concerns regarding his wounds and/or the treatment plan. They deny any falls since his last appointment or any new injuries. READMISSION  this is a 83 year old man who is been seen in the clinic in 2017 for multiple skin tears after falling. These were fairly easy to close over unfortunately the patient's recent course is not gone well. He fell and suffered a right hip fracture at the beginning of March. He was discharged to a skilled facility and sometime during the stay at the skilled facility his daughter noted a bandage on his left elbow. It wasn't told later that they learned he had fallen out of bed sustaining an injury. After discharge a week ago he apparently had a off- balance issue falling against the wall and hit the left elbow again. Been using peroxide and Neosporin. Seen by the family doctor yesterday and x-ray of the left elbow was negative. He was put on doxycycline. Ellett, Reinhart L. ([id number]) The patient has a history of coronary artery disease status post stent, [name], hypertension, lumbar spinal stenosis, multiple myeloma and BPH. He is apparently had bilateral hip replacements secondary to falling with the most recent one being on the right. He states that on the times he has fallen he has got up to go to the bathroom and becomes "swimmy headed" although he does not describe the off-balance sensation were reproducibly with changes in position 06/10/16; left elbow wounds look improved this is on the posterior aspect over the olecranon area. Healthy- looking granulation. The Prisma apparently stuck to the wound bed  at least as being applied by home health. We changed to [facility]. 06/17/16; the areas over the left elbow look improved. There are 2 small open areas. Light surface debridement to remove surface eschar. Objective Constitutional Sitting or standing Blood Pressure is within target range for patient.. Pulse regular and within target range for patient.Marland Kitchen Respirations regular, non-labored and within target range.. Temperature is normal and within the target range for the patient.. Patient's appearance is neat and clean. Appears in no acute distress. Well nourished and well developed.. Vitals Time Taken: 2:44 PM, Height: 71 in, Weight: 183 lbs, BMI: 25.5, Temperature: 97.9 F, Pulse: 87 bpm, Respiratory Rate: 18 breaths/min, Blood Pressure: 114/53 mmHg. General Notes: Wound exam; 2 open areas healthy granulation. Using a #3 curet I removed eschar without finding any additional wound areas under these. Integumentary (Hair, Skin) Wound #8 status is Open. Original cause of wound was Trauma. The wound is located on the Left,Lateral Upper Arm. The wound measures 1.1cm length x 1.2cm width x 0.1cm depth; 1.037cm^2 area and 0.104cm^3 volume. The wound is limited to skin breakdown. There is no tunneling or undermining noted. There is a large amount of serosanguineous drainage noted. The wound margin is flat and intact. There is large (67-100%) pink granulation within the wound bed. There is no necrotic tissue within the wound bed. The periwound skin appearance did not exhibit: Callus, Crepitus, Excoriation, Induration, Rash, Scarring, Dry/Scaly, Maceration, Atrophie Blanche, Cyanosis, Ecchymosis, Hemosiderin Staining, Mottled, Pallor, Rubor, Erythema. Assessment Active Problems Heckmann, [name] ([id number]) ICD-10 S41.112D - Laceration without foreign body of left upper arm, subsequent encounter S41.111D - Laceration without foreign body of right upper arm, subsequent encounter I95.2 - Hypotension  due to drugs Plan Wound Cleansing: Wound #8 Left,Lateral Upper Arm: Clean wound with wound cleanser. Anesthetic: Wound #8 Left,Lateral Upper Arm: Topical Lidocaine 4% cream applied to wound bed prior to debridement Primary Wound Dressing: Wound #8 Left,Lateral Upper Arm: Prisma Ag Secondary Dressing: Wound #8 Left,Lateral Upper Arm: Dry Gauze Boardered Foam Dressing Dressing Change Frequency: Wound #8 Left,Lateral Upper Arm: Three times  weekly - Once in clinic Follow-up Appointments: Wound #8 Left,Lateral Upper Arm: Return Appointment in 1 week. Home Health: Wound #8 Left,Lateral Upper Arm: Continue Home Health Visits - [place] Nurse may visit PRN to address patient s wound care needs. FACE TO FACE ENCOUNTER: MEDICARE and MEDICAID PATIENTS: I certify that this patient is under my care and that I had a face-to-face encounter that meets the physician face-to-face encounter requirements with this patient on this date. The encounter with the patient was in whole or in part for the following MEDICAL CONDITION: (primary reason for [place]) MEDICAL NECESSITY: I certify, that based on my findings, NURSING services are a medically necessary home health service. HOME BOUND STATUS: I certify that my clinical findings support that this patient is homebound (i.e., Due to illness or injury, pt requires aid of supportive devices such as crutches, cane, wheelchairs, walkers, the use of special transportation or the assistance of another person to leave their place of residence. There is a normal inability to leave the home and doing so requires considerable and taxing effort. Other absences are for medical reasons / religious services and are infrequent or of short duration when for other reasons). If current dressing causes regression in wound condition, may D/C ordered dressing product/s and apply Normal Saline Moist Dressing daily until next [place] / Other MD  appointment. [place] of regression in wound condition at [phone]. Please direct any NON-WOUND related issues/requests for orders to patient's Primary Care Physician JARRAH, BABICH ([id number]) #1 continue Prisma with border foam. Only to well granulated small wounds remained Electronic Signature(s) Signed: 06/17/2016 5:52:53 PM By: Linton Ham MD Entered By: Linton Ham on 06/17/2016 17:37:14 [place], [place]. ([id number]) -------------------------------------------------------------------------------- SuperBill Details Patient Name: Hechavarria, Ario L. Date of Service: 06/17/2016 Medical Record Number: [id number] Patient Account Number: [id number] Date of Birth/Sex: Jul 15, 1923 (83 y.o. Male) Treating RN: Montey Hora Primary Care Provider: [facility], Dossie Der Other Clinician: Referring Provider: [facility], [facility] Treating Provider/Extender: Tito Dine in Treatment: 2 Diagnosis Coding ICD-10 Codes Code Description S41.112D Laceration without foreign body of left upper arm, subsequent encounter S41.111D Laceration without foreign body of right upper arm, subsequent encounter I95.2 Hypotension due to drugs Facility Procedures CPT4 Code: [id number] Description: [phone] - WOUND CARE VISIT-LEV 2 EST PT Modifier: Quantity: 1 Physician Procedures CPT4 Code Description: [id number] - WC PHYS LEVEL 2 - EST PT ICD-10 Description Diagnosis S41.112D Laceration without foreign body of left upper arm S41.111D Laceration without foreign body of right upper ar Modifier: , subsequent e m, subsequent Quantity: 1 ncounter encounter Electronic Signature(s) Signed: 06/17/2016 5:52:53 PM By: Linton Ham MD Entered By: Linton Ham on 06/17/2016 17:37:36

## 2016-06-22 ENCOUNTER — Other Ambulatory Visit: Payer: Self-pay | Admitting: Family Medicine

## 2016-06-22 ENCOUNTER — Ambulatory Visit (INDEPENDENT_AMBULATORY_CARE_PROVIDER_SITE_OTHER): Payer: Medicare Other | Admitting: Family Medicine

## 2016-06-22 ENCOUNTER — Encounter: Payer: Self-pay | Admitting: Family Medicine

## 2016-06-22 DIAGNOSIS — I2510 Atherosclerotic heart disease of native coronary artery without angina pectoris: Secondary | ICD-10-CM

## 2016-06-22 NOTE — Progress Notes (Signed)
This encounter was created in error - please disregard.Name: [name]   MRN: [id number]    DOB: [birth year]   Date:06/22/2016       Progress Note  Subjective  Chief Complaint  Chief Complaint  Patient presents with  . Follow-up    3 mo  . Medication Refill    HPI    Past Medical History:  Diagnosis Date  . Anxiety   . BPH (benign prostatic hypertrophy)   . Carotid stenosis   . Chronic back pain   . Coronary artery disease   . Dyslipidemia   . GERD (gastroesophageal reflux disease)   . Hx of [name]   . Hyperlipidemia   . Hypertension   . Inguinal hernia   . 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 Major depression   . Multiple myeloma ([place])   . Pleural effusion    left  . Sleep apnea     Past Surgical History:  Procedure Laterality Date  . ANTERIOR APPROACH HEMI HIP ARTHROPLASTY Right 04/16/2016   Procedure: ANTERIOR APPROACH HEMI HIP ARTHROPLASTY;  Surgeon: Hessie Knows, MD;  Location: [facility] ORS;  Service: Orthopedics;  Laterality: Right;  . APPENDECTOMY    . CARDIAC CATHETERIZATION  2002  . CARDIAC CATHETERIZATION  06/2013  . COLONOSCOPY    . FEMUR FRACTURE SURGERY Left    with rod  . HIP FRACTURE SURGERY    . JOINT REPLACEMENT     right partial knee replacement    Family History  Problem Relation Age of Onset  . Heart disease Mother   . Stroke Mother   . Diabetes Father   . Dementia Father   . Stroke Sister   . Stroke Brother   . Stroke Sister   . Stroke Sister   . Stroke Brother   . Stroke Brother     Social History   Social History  . Marital status: Married    Spouse name: N/A  . Number of children: N/A  . Years of education: N/A   Occupational History  . Not on file.   Social History Main Topics  . Smoking status: Former Smoker    Packs/day: 2.00    Years: 7.00    Types: Cigarettes    Quit date: 09/17/1953  . Smokeless tobacco: Never Used  . Alcohol use No  . Drug use: No  . Sexual activity: Not Currently   Other Topics Concern   . Not on file   Social History Narrative  . No narrative on file     Current Outpatient Prescriptions:  .  acetaminophen (TYLENOL) 500 MG tablet, Take 2 tablets (1,000 mg total) by mouth every 6 (six) hours., Disp: 30 tablet, Rfl: 0 .  aspirin EC 81 MG tablet, Take 1 tablet (81 mg total) by mouth daily., Disp: 90 tablet, Rfl: 3 .  clonazePAM (KLONOPIN) 0.5 MG tablet, Take 1 tablet (0.5 mg total) by mouth 3 (three) times daily as needed for anxiety., Disp: 90 tablet, Rfl: 2 .  diclofenac sodium (VOLTAREN) 1 % GEL, Apply 2 g topically 4 (four) times daily., Disp: 1 Tube, Rfl: 0 .  docusate sodium (COLACE) 100 MG capsule, Take 1 capsule (100 mg total) by mouth 2 (two) times daily., Disp: 10 capsule, Rfl: 0 .  doxazosin (CARDURA) 1 MG tablet, TAKE ONE TABLET BY MOUTH ONCE DAILY, Disp: 90 tablet, Rfl: 0 .  isosorbide mononitrate (IMDUR) 30 MG 24 hr tablet, Take 1 tablet (30 mg total) by mouth daily., Disp: 90 tablet,  Rfl: 1 .  metoprolol tartrate (LOPRESSOR) 25 MG tablet, Take 0.5 tablets (12.5 mg total) by mouth 2 (two) times daily., Disp: 180 tablet, Rfl: 0 .  Multiple Vitamins-Minerals (PRESERVISION/LUTEIN PO), Take by mouth 2 (two) times daily., Disp: , Rfl:  .  nitroGLYCERIN (NITROSTAT) 0.4 MG SL tablet, Place 0.4 mg under the tongue every 5 (five) minutes as needed for chest pain., Disp: , Rfl:  .  ondansetron (ZOFRAN) 4 MG tablet, Take 1 tablet (4 mg total) by mouth every 6 (six) hours as needed for nausea., Disp: 20 tablet, Rfl: 0 .  oxyCODONE (OXY IR/ROXICODONE) 5 MG immediate release tablet, Take 1-2 tablets (5-10 mg total) by mouth every 3 (three) hours as needed for breakthrough pain., Disp: 30 tablet, Rfl: 0 .  pantoprazole (PROTONIX) 40 MG tablet, Take 1 tablet (40 mg total) by mouth daily., Disp: 90 tablet, Rfl: 0 .  polyethylene glycol (MIRALAX / GLYCOLAX) packet, Take 17 g by mouth daily., Disp: , Rfl:  .  pravastatin (PRAVACHOL) 40 MG tablet, TAKE ONE TABLET BY MOUTH ONCE DAILY,  Disp: 90 tablet, Rfl: 0 .  sertraline (ZOLOFT) 100 MG tablet, Take 1.5 tablets (150 mg total) by mouth at bedtime., Disp: 45 tablet, Rfl: 1 .  enoxaparin (LOVENOX) 40 MG/0.4ML injection, Inject 0.4 mLs (40 mg total) into the skin daily., Disp: 0 Syringe, Rfl:   Allergies  Allergen Reactions  . Sulfamethoxazole-Trimethoprim Nausea Only and Other (See Comments)  . Baclofen   . Nsaids     Avoids due to liver.     ROS    Objective  Vitals:   06/22/16 1355  BP: 138/72  Pulse: 93  Resp: 17  Temp: 97.6 F (36.4 C)  TempSrc: Oral  SpO2: 96%  Weight: 188 lb 8 oz (85.5 kg)  Height: '5\' 10"'$  (1.778 m)    Physical Exam       Assessment & Plan  There are no diagnoses linked to this encounter.  Dayanira Giovannetti [facility] 06/22/2016 2:12 PM

## 2016-06-23 ENCOUNTER — Telehealth: Payer: Self-pay

## 2016-06-23 DIAGNOSIS — I2510 Atherosclerotic heart disease of native coronary artery without angina pectoris: Secondary | ICD-10-CM

## 2016-06-23 MED ORDER — METOPROLOL TARTRATE 25 MG PO TABS: 12.5000 mg | ORAL_TABLET | Freq: Two times a day (BID) | ORAL | 0 refills | Status: DC

## 2016-06-23 NOTE — Telephone Encounter (Signed)
Medication has been refilled and sent to [facility] [address] 

## 2016-06-24 ENCOUNTER — Encounter: Payer: Medicare Other | Admitting: Internal Medicine

## 2016-06-24 DIAGNOSIS — S41112D Laceration without foreign body of left upper arm, subsequent encounter: Secondary | ICD-10-CM | POA: Diagnosis not present

## 2016-06-26 NOTE — Progress Notes (Signed)
JEROMEY, KRUER ([id number]) Visit Report for 06/24/2016 Chief Complaint Document Details Patient Name: [name], [name] Date of Service: 06/24/2016 1:30 PM Medical Record Number: [id number] Patient Account Number: [id number] Date of Birth/Sex: [birth year] (83 y.o. Male) Treating RN: Montey Hora Primary Care Provider: [facility], Dossie Der Other Clinician: Referring Provider: [facility], [facility] Treating Provider/Extender: Tito Dine in Treatment: 3 Information Obtained from: Patient Chief Complaint Patient presents to the wound care center for a consult due non healing wound to his right elbow, left knee, left upper arm and left upper back which she's had for about a week Electronic Signature(s) Signed: 06/24/2016 5:27:22 PM By: Linton Ham MD Entered By: Linton Ham on 06/24/2016 13:56:34 Pace, Holloway. ([id number]) -------------------------------------------------------------------------------- HPI Details Patient Name: [name], [name] Date of Service: 06/24/2016 1:30 PM Medical Record Number: [id number] Patient Account Number: [id number] Date of Birth/Sex: August 09, 1923 (83 y.o. Male) Treating RN: Montey Hora Primary Care Provider: [facility], Dossie Der Other Clinician: Referring Provider: [facility], [facility] Treating Provider/Extender: Tito Dine in Treatment: 3 History of Present Illness Location: right elbow, left knee, left upper arm and left upper back Quality: Patient reports experiencing a dull pain to affected area(s). Severity: Patient states wound (s) are getting better. Duration: Patient has had the wound for < 1 weeks prior to presenting for treatment Timing: Pain in wound is Intermittent (comes and goes Context: The wound occurred when the patient had a fall and hurt his back Modifying Factors: Other treatment(s) tried include:local care with Neosporin ointment Associated Signs and Symptoms: Patient reports having:scar tissue opens out every time they do dressing HPI Description:  83 year old patient who was in [place] in May a couple of times for a lacerated wound which was healed over a couple of weeks. He now comes with recent injuries to his back and knees and both arms sustained after a fall. He did not seek any medical help and they use dressing material which was previously there. Past medical history significant for carotid stenosis, [name], [name], dyslipidemia, hypertension, anxiety, GERD, multiple myeloma, BPH, inguinal hernia and is status post appendectomy, hip fracture, cardiac catheterization. He smoked in the remote past and has not smoked for several years. He has had long-term low back pain and was evaluated for pain radiating to the left groin. 02-06-16 Mr. Lightsey progress for evaluation of multiple skin tears, he is accompanied by his wife. Neither patient nor his wife expressed the concerns regarding his wounds and/or the treatment plan. They deny any falls since his last appointment or any new injuries. READMISSION this is a 83 year old man who is been seen in the clinic in 2017 for multiple skin tears after falling. These were fairly easy to close over unfortunately the patient's recent course is not gone well. He fell and suffered a right hip fracture at the beginning of March. He was discharged to a skilled facility and sometime during the stay at the skilled facility his daughter noted a bandage on his left elbow. It wasn't told later that they learned he had fallen out of bed sustaining an injury. After discharge a week ago he apparently had a off- balance issue falling against the wall and hit the left elbow again. Been using peroxide and Neosporin. Seen by the family doctor yesterday and x-ray of the left elbow was negative. He was put on doxycycline. The patient has a history of coronary artery disease status post stent, [name], hypertension, lumbar spinal stenosis, multiple myeloma and BPH. He is apparently had bilateral hip  replacements secondary to falling with the most recent one being on the right. He states that on the times he has fallen he has got up to go to the bathroom and becomes "swimmy headed" although he does not describe the off-balance sensation were reproducibly with changes in position 06/10/16; left elbow wounds look improved this is on the posterior aspect over the olecranon area. Healthy- Kreft, Jarret L. ([id number]) looking granulation. The Prisma apparently stuck to the wound bed at least as being applied by home health. We changed to [facility]. 06/17/16; the areas over the left elbow look improved. There are 2 small open areas. Light surface debridement to remove surface eschar. 06/24/16; left elbow as predicted last week has epithelialized as predicted HOWEVER today he arrives with blisters in a perfect distribution limited to the wound. Electronic Signature(s) Signed: 06/24/2016 5:27:22 PM By: Linton Ham MD Entered By: Linton Ham on 06/24/2016 14:04:00 [place], Crenshaw ([id number]) -------------------------------------------------------------------------------- Physical Exam Details Patient Name: [name], [name] Date of Service: 06/24/2016 1:30 PM Medical Record Number: [id number] Patient Account Number: [id number] Date of Birth/Sex: 10-Sep-1923 (83 y.o. Male) Treating RN: Montey Hora Primary Care Provider: [facility], Dossie Der Other Clinician: Referring Provider: [facility], [facility] Treating Provider/Extender: Tito Dine in Treatment: 3 Constitutional Sitting or standing Blood Pressure is within target range for patient.. Pulse regular and within target range for patient.Marland Kitchen Respirations regular, non-labored and within target range.. Temperature is normal and within the target range for the patient.. Patient's appearance is neat and clean. Appears in no acute distress. Well nourished and well developed.. Notes Wound exam; 2 wounds are blistered. I did not open these. not clear of the  etiology of this Electronic Signature(s) Signed: 06/24/2016 5:27:22 PM By: Linton Ham MD Entered By: Linton Ham on 06/24/2016 14:05:07 [place], [place]. ([id number]) -------------------------------------------------------------------------------- Physician Orders Details Patient Name: [name], [name] Date of Service: 06/24/2016 1:30 PM Medical Record Number: [id number] Patient Account Number: [id number] Date of Birth/Sex: 06-13-1923 (83 y.o. Male) Treating RN: Montey Hora Primary Care Provider: [facility], Dossie Der Other Clinician: Referring Provider: [facility], [facility] Treating Provider/Extender: Tito Dine in Treatment: 3 Verbal / Phone Orders: No Diagnosis Coding Wound Cleansing Wound #8 Left,Lateral Upper Arm o Clean wound with wound cleanser. Anesthetic Wound #8 Left,Lateral Upper Arm o Topical Lidocaine 4% cream applied to wound bed prior to debridement Primary Wound Dressing Wound #8 Left,Lateral Upper Arm o Prisma Ag Secondary Dressing Wound #8 Left,Lateral Upper Arm o Other - bandaid Dressing Change Frequency Wound #8 Left,Lateral Upper Arm o Three times weekly - Once in clinic Follow-up Appointments Wound #8 Left,Lateral Upper Arm o Return Appointment in 1 week. Home Health Wound #8 Left,Lateral Upper Arm o [place] Visits - [place] Nurse may visit PRN to address patientos wound care needs. o FACE TO FACE ENCOUNTER: MEDICARE and MEDICAID PATIENTS: I certify that this patient is under my care and that I had a face-to-face encounter that meets the physician face-to-face encounter requirements with this patient on this date. The encounter with the patient was in whole or in part for the following MEDICAL CONDITION: (primary reason for [place]) MEDICAL NECESSITY: I certify, that based on my findings, NURSING services are a medically necessary home health service. HOME BOUND STATUS: I certify that my clinical  findings support that this patient is homebound (i.e., Due to illness or injury, pt requires aid of [place], Xander L. ([id number]) supportive devices such as crutches, cane, wheelchairs, walkers, the use of special transportation  or the assistance of another person to leave their place of residence. There is a normal inability to leave the home and doing so requires considerable and taxing effort. Other absences are for medical reasons / religious services and are infrequent or of short duration when for other reasons). o If current dressing causes regression in wound condition, may D/C ordered dressing product/s and apply Normal Saline Moist Dressing daily until next [place] / Other MD appointment. Hand of regression in wound condition at [phone]. o Please direct any NON-WOUND related issues/requests for orders to patient's Primary Care Physician Electronic Signature(s) Signed: 06/24/2016 5:11:41 PM By: Montey Hora Signed: 06/24/2016 5:27:22 PM By: Linton Ham MD Entered By: Montey Hora on 06/24/2016 13:44:59 [place], Christy L. ([id number]) -------------------------------------------------------------------------------- Problem List Details Patient Name: [name], [name] Date of Service: 06/24/2016 1:30 PM Medical Record Number: [id number] Patient Account Number: [id number] Date of Birth/Sex: Apr 09, 1923 (83 y.o. Male) Treating RN: Montey Hora Primary Care Provider: [facility], [place]ossie Der Other Clinician: Referring Provider: [facility], [facility] Treating Provider/Extender: Tito Dine in Treatment: 3 Active Problems ICD-10 Encounter Code Description Active Date Diagnosis S41.112D Laceration without foreign body of left upper arm, 06/02/2016 Yes subsequent encounter S41.111D Laceration without foreign body of right upper arm, 06/02/2016 Yes subsequent encounter I95.2 Hypotension due to drugs 06/02/2016 Yes Inactive Problems Resolved  Problems Electronic Signature(s) Signed: 06/24/2016 5:27:22 PM By: Linton Ham MD Entered By: Linton Ham on 06/24/2016 13:56:18 Benefiel, Mancel L. ([id number]) -------------------------------------------------------------------------------- Progress Note Details Patient Name: [name], [name] Date of Service: 06/24/2016 1:30 PM Medical Record Number: [id number] Patient Account Number: [id number] Date of Birth/Sex: [birth year] (83 y.o. Male) Treating RN: Montey Hora Primary Care Provider: [facility], [facility] Other Clinician: Referring Provider: [facility], [facility] Treating Provider/Extender: Tito Dine in Treatment: 3 Subjective Chief Complaint Information obtained from Patient Patient presents to the wound care center for a consult due non healing wound to his right elbow, left knee, left upper arm and left upper back which she's had for about a week History of Present Illness (HPI) The following HPI elements were documented for the patient's wound: Location: right elbow, left knee, left upper arm and left upper back Quality: Patient reports experiencing a dull pain to affected area(s). Severity: Patient states wound (s) are getting better. Duration: Patient has had the wound for < 1 weeks prior to presenting for treatment Timing: Pain in wound is Intermittent (comes and goes Context: The wound occurred when the patient had a fall and hurt his back Modifying Factors: Other treatment(s) tried include:local care with Neosporin ointment Associated Signs and Symptoms: Patient reports having:scar tissue opens out every time they do dressing 82 year old patient who was in [place] in May a couple of times for a lacerated wound which was healed over a couple of weeks. He now comes with recent injuries to his back and knees and both arms sustained after a fall. He did not seek any medical help and they use dressing material which was previously there. Past medical history significant for carotid  stenosis, [name], [name], dyslipidemia, hypertension, anxiety, GERD, multiple myeloma, BPH, inguinal hernia and is status post appendectomy, hip fracture, cardiac catheterization. He smoked in the remote past and has not smoked for several years. He has had long-term low back pain and was evaluated for pain radiating to the left groin. 02-06-16 Mr. Buxton progress for evaluation of multiple skin tears, he is accompanied by his wife. Neither patient nor his wife expressed the concerns regarding his  wounds and/or the treatment plan. They deny any falls since his last appointment or any new injuries. READMISSION this is a 83 year old man who is been seen in the clinic in 2017 for multiple skin tears after falling. These were fairly easy to close over unfortunately the patient's recent course is not gone well. He fell and suffered a right hip fracture at the beginning of March. He was discharged to a skilled facility and sometime during the stay at the skilled facility his daughter noted a bandage on his left elbow. It wasn't told later that they learned he had fallen out of bed sustaining an injury. After discharge a week ago he apparently had a off- balance issue falling against the wall and hit the left elbow again. Been using peroxide and Neosporin. Seen by the family doctor yesterday and x-ray of the left elbow was negative. He was put on doxycycline. Mole, Jossue L. ([id number]) The patient has a history of coronary artery disease status post stent, [name], hypertension, lumbar spinal stenosis, multiple myeloma and BPH. He is apparently had bilateral hip replacements secondary to falling with the most recent one being on the right. He states that on the times he has fallen he has got up to go to the bathroom and becomes "swimmy headed" although he does not describe the off-balance sensation were reproducibly with changes in position 06/10/16; left elbow wounds look improved this is on the  posterior aspect over the olecranon area. Healthy- looking granulation. The Prisma apparently stuck to the wound bed at least as being applied by home health. We changed to [facility]. 06/17/16; the areas over the left elbow look improved. There are 2 small open areas. Light surface debridement to remove surface eschar. 06/24/16; left elbow as predicted last week has epithelialized as predicted HOWEVER today he arrives with blisters in a perfect distribution limited to the wound. Objective Constitutional Sitting or standing Blood Pressure is within target range for patient.. Pulse regular and within target range for patient.Marland Kitchen Respirations regular, non-labored and within target range.. Temperature is normal and within the target range for the patient.. Patient's appearance is neat and clean. Appears in no acute distress. Well nourished and well developed.. Vitals Time Taken: 1:34 PM, Height: 71 in, Weight: 183 lbs, BMI: 25.5, Temperature: 97.7 F, Pulse: 75 bpm, Respiratory Rate: 18 breaths/min, Blood Pressure: 117/52 mmHg. General Notes: Wound exam; 2 wounds are blistered. I did not open these. not clear of the etiology of this Integumentary (Hair, Skin) Wound #8 status is Open. Original cause of wound was Trauma. The wound is located on the Left,Lateral Upper Arm. The wound measures 1.1cm length x 1.2cm width x 0.1cm depth; 1.037cm^2 area and 0.104cm^3 volume. The wound is limited to skin breakdown. There is no tunneling or undermining noted. There is a none present amount of drainage noted. The wound margin is flat and intact. There is no granulation within the wound bed. There is no necrotic tissue within the wound bed. The periwound skin appearance did not exhibit: Callus, Crepitus, Excoriation, Induration, Rash, Scarring, Dry/Scaly, Maceration, Atrophie Blanche, Cyanosis, Ecchymosis, Hemosiderin Staining, Mottled, Pallor, Rubor, Erythema. General Notes: blistered  area Assessment Mccain, Izeah L. ([id number]) Active Problems ICD-10 S41.112D - Laceration without foreign body of left upper arm, subsequent encounter S41.111D - Laceration without foreign body of right upper arm, subsequent encounter I95.2 - Hypotension due to drugs Plan Wound Cleansing: Wound #8 Left,Lateral Upper Arm: Clean wound with wound cleanser. Anesthetic: Wound #8 Left,Lateral Upper Arm: Topical Lidocaine  4% cream applied to wound bed prior to debridement Primary Wound Dressing: Wound #8 Left,Lateral Upper Arm: Prisma Ag Secondary Dressing: Wound #8 Left,Lateral Upper Arm: Other - bandaid Dressing Change Frequency: Wound #8 Left,Lateral Upper Arm: Three times weekly - Once in clinic Follow-up Appointments: Wound #8 Left,Lateral Upper Arm: Return Appointment in 1 week. Home Health: Wound #8 Left,Lateral Upper Arm: Continue Home Health Visits - [place] Nurse may visit PRN to address patient s wound care needs. FACE TO FACE ENCOUNTER: MEDICARE and MEDICAID PATIENTS: I certify that this patient is under my care and that I had a face-to-face encounter that meets the physician face-to-face encounter requirements with this patient on this date. The encounter with the patient was in whole or in part for the following MEDICAL CONDITION: (primary reason for [place]) MEDICAL NECESSITY: I certify, that based on my findings, NURSING services are a medically necessary home health service. HOME BOUND STATUS: I certify that my clinical findings support that this patient is homebound (i.e., Due to illness or injury, pt requires aid of supportive devices such as crutches, cane, wheelchairs, walkers, the use of special transportation or the assistance of another person to leave their place of residence. There is a normal inability to leave the home and doing so requires considerable and taxing effort. Other absences are for medical reasons / religious services and are  infrequent or of short duration when for other reasons). If current dressing causes regression in wound condition, may D/C ordered dressing product/s and apply Normal Saline Moist Dressing daily until next [place] / Other MD appointment. [name] of regression in wound condition at [phone]. Please direct any NON-WOUND related issues/requests for orders to patient's Primary Care Physician [name], [name] ([id number]) 1 apparently the home health dnurse took his dressing off on Monday and called it "healed" 2 FRICTION but from what oo Electronic Signature(s) Signed: 06/24/2016 5:27:22 PM By: Linton Ham MD Entered By: Linton Ham on 06/24/2016 14:06:36 [place], Kalai L. ([id number]) -------------------------------------------------------------------------------- SuperBill Details Patient Name: Karn, [name] Date of Service: 06/24/2016 Medical Record Number: [id number] Patient Account Number: [id number] Date of Birth/Sex: 11-24-1923 (83 y.o. Male) Treating RN: Montey Hora Primary Care Provider: [facility], Dossie Der Other Clinician: Referring Provider: [facility], [facility] Treating Provider/Extender: Tito Dine in Treatment: 3 Diagnosis Coding ICD-10 Codes Code Description S41.112D Laceration without foreign body of left upper arm, subsequent encounter S41.111D Laceration without foreign body of right upper arm, subsequent encounter I95.2 Hypotension due to drugs Facility Procedures CPT4 Code: [id number] Description: [phone] - WOUND CARE VISIT-LEV 2 EST PT Modifier: Quantity: 1 Physician Procedures CPT4 Code Description: [id number] - WC PHYS LEVEL 2 - EST PT ICD-10 Description Diagnosis S41.112D Laceration without foreign body of left upper arm Modifier: , subsequent e Quantity: 1 ncounter Electronic Signature(s) Signed: 06/24/2016 2:10:54 PM By: Montey Hora Signed: 06/24/2016 5:27:22 PM By: Linton Ham MD Entered By: Montey Hora on 06/24/2016  14:10:53

## 2016-06-26 NOTE — Progress Notes (Signed)
[name], [name] ([id number]) Visit Report for 06/24/2016 Arrival Information Details Patient Name: [name], [name] Date of Service: 06/24/2016 1:30 PM Medical Record Number: [id number] Patient Account Number: [id number] Date of Birth/Sex: May 30, 1923 (83 y.o. Male) Treating RN: Montey Hora Primary Care Naythan Douthit: [facility], [facility] Other Clinician: Referring Alashia Brownfield: [facility], [facility] Treating Marlina Cataldi/Extender: Tito Dine in Treatment: 3 Visit Information History Since Last Visit Added or deleted any medications: No Patient Arrived: Walker Any new allergies or adverse reactions: No Arrival Time: 13:32 Had a fall or experienced change in No Accompanied By: spouse activities of daily living that may affect Transfer Assistance: None risk of falls: Patient Identification Verified: Yes Signs or symptoms of abuse/neglect since last No Secondary Verification Process Completed: Yes visito Hospitalized since last visit: No Has Dressing in Place as Prescribed: No Pain Present Now: No Electronic Signature(s) Signed: 06/24/2016 5:11:41 PM By: Montey Hora Entered By: Montey Hora on 06/24/2016 13:33:02 [name], [name] ([id number]) -------------------------------------------------------------------------------- Clinic Level of Care Assessment Details Patient Name: [name], [name] Date of Service: 06/24/2016 1:30 PM Medical Record Number: [id number] Patient Account Number: [id number] Date of Birth/Sex: 1923/12/17 (83 y.o. Male) Treating RN: Montey Hora Primary Care Arlett Goold: [facility], [facility] Other Clinician: Referring Tushar Enns: [facility], [facility] Treating Imogean Ciampa/Extender: Tito Dine in Treatment: 3 Clinic Level of Care Assessment Items TOOL 4 Quantity Score []  - Use when only an EandM is performed on FOLLOW-UP visit 0 ASSESSMENTS - Nursing Assessment / Reassessment X - Reassessment of Co-morbidities (includes updates in patient status) 1 10 X - Reassessment of Adherence to Treatment Plan 1  5 ASSESSMENTS - Wound and Skin Assessment / Reassessment X - Simple Wound Assessment / Reassessment - one wound 1 5 []  - Complex Wound Assessment / Reassessment - multiple wounds 0 []  - Dermatologic / Skin Assessment (not related to wound area) 0 ASSESSMENTS - Focused Assessment []  - Circumferential Edema Measurements - multi extremities 0 []  - Nutritional Assessment / Counseling / Intervention 0 []  - Lower Extremity Assessment (monofilament, tuning fork, pulses) 0 []  - Peripheral Arterial Disease Assessment (using hand held doppler) 0 ASSESSMENTS - Ostomy and/or Continence Assessment and Care []  - Incontinence Assessment and Management 0 []  - Ostomy Care Assessment and Management (repouching, etc.) 0 PROCESS - Coordination of Care X - Simple Patient / Family Education for ongoing care 1 15 []  - Complex (extensive) Patient / Family Education for ongoing care 0 []  - Staff obtains Programmer, systems, Records, Test Results / Process Orders 0 []  - Staff telephones HHA, Nursing Homes / Clarify orders / etc 0 []  - Routine Transfer to another Facility (non-emergent condition) 0 [name], [name] ([id number]) []  - Routine Hospital Admission (non-emergent condition) 0 []  - New Admissions / Biomedical engineer / Ordering NPWT, Apligraf, etc. 0 []  - Emergency Hospital Admission (emergent condition) 0 X - Simple Discharge Coordination 1 10 []  - Complex (extensive) Discharge Coordination 0 PROCESS - Special Needs []  - Pediatric / Minor Patient Management 0 []  - Isolation Patient Management 0 []  - Hearing / Language / Visual special needs 0 []  - Assessment of Community assistance (transportation, D/C planning, etc.) 0 []  - Additional assistance / Altered mentation 0 []  - Support Surface(s) Assessment (bed, cushion, seat, etc.) 0 INTERVENTIONS - Wound Cleansing / Measurement X - Simple Wound Cleansing - one wound 1 5 []  - Complex Wound Cleansing - multiple wounds 0 X - Wound Imaging (photographs - any  number of wounds) 1 5 []  - Wound Tracing (instead of photographs) 0 X -  Simple Wound Measurement - one wound 1 5 []  - Complex Wound Measurement - multiple wounds 0 INTERVENTIONS - Wound Dressings X - Small Wound Dressing one or multiple wounds 1 10 []  - Medium Wound Dressing one or multiple wounds 0 []  - Large Wound Dressing one or multiple wounds 0 []  - Application of Medications - topical 0 []  - Application of Medications - injection 0 INTERVENTIONS - Miscellaneous []  - External ear exam 0 [name], [name] ([id number]) []  - Specimen Collection (cultures, biopsies, blood, body fluids, etc.) 0 []  - Specimen(s) / Culture(s) sent or taken to Lab for analysis 0 []  - Patient Transfer (multiple staff / Harrel Lemon Lift / Similar devices) 0 []  - Simple Staple / Suture removal (25 or less) 0 []  - Complex Staple / Suture removal (26 or more) 0 []  - Hypo / Hyperglycemic Management (close monitor of Blood Glucose) 0 []  - Ankle / Brachial Index (ABI) - do not check if billed separately 0 X - Vital Signs 1 5 Has the patient been seen at the hospital within the last three years: Yes Total Score: 75 Level Of Care: New/Established - Level 2 Electronic Signature(s) Signed: 06/24/2016 5:11:41 PM By: Montey Hora Entered By: Montey Hora on 06/24/2016 14:10:41 [name], [name] ([id number]) -------------------------------------------------------------------------------- Encounter Discharge Information Details Patient Name: [name], [name] Date of Service: 06/24/2016 1:30 PM Medical Record Number: [id number] Patient Account Number: [id number] Date of Birth/Sex: 1923-06-05 (83 y.o. Male) Treating RN: Montey Hora Primary Care Levert Heslop: [facility], [facility] Other Clinician: Referring Wilkins Elpers: [facility], [facility] Treating Kenesha Moshier/Extender: Tito Dine in Treatment: 3 Encounter Discharge Information Items Discharge Pain Level: 0 Discharge Condition: Stable Ambulatory Status: Walker Discharge Destination:  Home Transportation: Private Auto Accompanied By: spouse Schedule Follow-up Appointment: Yes Medication Reconciliation completed No and provided to Patient/Care Gregoria Selvy: Provided on Clinical Summary of Care: 06/24/2016 Form Type Recipient Paper Patient AS Electronic Signature(s) Signed: 06/24/2016 2:11:35 PM By: Montey Hora Previous Signature: 06/24/2016 1:50:27 PM Version By: Ruthine Dose Entered By: Montey Hora on 06/24/2016 14:11:35 Wignall, Lonzy L. ([id number]) -------------------------------------------------------------------------------- Multi Wound Chart Details Patient Name: [name], [name] Date of Service: 06/24/2016 1:30 PM Medical Record Number: [id number] Patient Account Number: [id number] Date of Birth/Sex: 09/03/1923 (83 y.o. Male) Treating RN: Montey Hora Primary Care Juston Goheen: [facility], [place]ossie Der Other Clinician: Referring Aman Bonet: [facility], [facility] Treating Shem Plemmons/Extender: Tito Dine in Treatment: 3 Vital Signs Height(in): 71 Pulse(bpm): 75 Weight(lbs): 183 Blood Pressure 117/52 (mmHg): Body Mass Index(BMI): 26 Temperature(F): 97.7 Respiratory Rate 18 (breaths/min): Photos: [8:No Photos] [N/A:N/A] Wound Location: [8:Left Upper Arm - Lateral] [N/A:N/A] Wounding Event: [8:Trauma] [N/A:N/A] Primary Etiology: [8:Trauma, Other] [N/A:N/A] Comorbid History: [8:Cataracts, Chronic sinus problems/congestion, Sleep Apnea, Coronary Artery Disease, Osteoarthritis, Received Chemotherapy] [N/A:N/A] Date Acquired: [8:05/19/2016] [N/A:N/A] Weeks of Treatment: [8:3] [N/A:N/A] Wound Status: [8:Open] [N/A:N/A] Clustered Wound: [8:Yes] [N/A:N/A] Clustered Quantity: [8:4] [N/A:N/A] Measurements L x W x D 1.1x1.2x0.1 [N/A:N/A] (cm) Area (cm) : [8:1.037] [N/A:N/A] Volume (cm) : [8:0.104] [N/A:N/A] % Reduction in Area: [8:98.30%] [N/A:N/A] % Reduction in Volume: 99.20% [N/A:N/A] Classification: [8:Partial Thickness] [N/A:N/A] Exudate Amount: [8:None Present]  [N/A:N/A] Wound Margin: [8:Flat and Intact] [N/A:N/A] Granulation Amount: [8:None Present (0%)] [N/A:N/A] Necrotic Amount: [8:None Present (0%)] [N/A:N/A] Exposed Structures: [8:Fascia: No Fat Layer (Subcutaneous Tissue) Exposed: No Tendon: No] [N/A:N/A] Muscle: No Joint: No Bone: No Limited to Skin Breakdown Epithelialization: Large (67-100%) N/A N/A Periwound Skin Texture: Excoriation: No N/A N/A Induration: No Callus: No Crepitus: No Rash: No Scarring: No Periwound Skin Maceration: No N/A N/A Moisture: Dry/Scaly: No Periwound Skin  Color: Atrophie Blanche: No N/A N/A Cyanosis: No Ecchymosis: No Erythema: No Hemosiderin Staining: No Mottled: No Pallor: No Rubor: No Tenderness on No N/A N/A Palpation: Wound Preparation: Ulcer Cleansing: N/A N/A Rinsed/Irrigated with Saline Topical Anesthetic Applied: None Assessment Notes: blistered area N/A N/A Treatment Notes Electronic Signature(s) Signed: 06/24/2016 5:27:22 PM By: Linton Ham MD Entered By: Linton Ham on 06/24/2016 13:56:22 [name], [name] ([id number]) -------------------------------------------------------------------------------- [place] Details Patient Name: [name], [name] Date of Service: 06/24/2016 1:30 PM Medical Record Number: [id number] Patient Account Number: [id number] Date of Birth/Sex: 1923/03/28 (83 y.o. Male) Treating RN: Montey Hora Primary Care Ozie Lupe: [facility] Dossie Der Other Clinician: Referring Adleigh Mcmasters: [facility], [facility] Treating Emeric Novinger/Extender: Tito Dine in Treatment: 3 Active Inactive ` Abuse / Safety / Falls / Self Care Management Nursing Diagnoses: Impaired physical mobility Potential for falls Goals: Patient will remain injury free Date Initiated: 06/02/2016 Target Resolution Date: 06/09/2016 Goal Status: Active Patient/caregiver will verbalize understanding of skin care regimen Date Initiated: 06/02/2016 Target Resolution Date: 06/09/2016 Goal  Status: Active Interventions: Assess fall risk on admission and as needed Notes: ` Necrotic Tissue Nursing Diagnoses: Impaired tissue integrity related to necrotic/devitalized tissue Goals: Necrotic/devitalized tissue will be minimized in the wound bed Date Initiated: 06/02/2016 Target Resolution Date: 06/09/2016 Goal Status: Active Interventions: Assess patient pain level pre-, during and post procedure and prior to discharge Treatment Activities: Apply topical anesthetic as ordered : 06/02/2016 [name], [name] ([id number]) Notes: ` Orientation to the Wound Care Program Nursing Diagnoses: Knowledge deficit related to the wound healing center program Goals: Patient/caregiver will verbalize understanding of the [place] Program Date Initiated: 06/02/2016 Target Resolution Date: 06/09/2016 Goal Status: Active Interventions: Provide education on orientation to the wound center Notes: ` Soft Tissue Infection Nursing Diagnoses: Impaired tissue integrity Knowledge deficit related to disease process and management Potential for infection: soft tissue Goals: Patient will remain free of wound infection Date Initiated: 06/02/2016 Target Resolution Date: 06/09/2016 Goal Status: Active Signs and symptoms of infection will be recognized early to allow for prompt treatment Date Initiated: 06/02/2016 Target Resolution Date: 06/09/2016 Goal Status: Active Interventions: Assess signs and symptoms of infection every visit Notes: ` Wound/Skin Impairment Nursing Diagnoses: Impaired tissue integrity Goals: Ulcer/skin breakdown will heal within 14 weeks [name], COLARUSSO ([id number]) Date Initiated: 06/02/2016 Target Resolution Date: 10/02/2016 Goal Status: Active Interventions: Assess ulceration(s) every visit Notes: Electronic Signature(s) Signed: 06/24/2016 5:11:41 PM By: Montey Hora Entered By: Montey Hora on 06/24/2016 13:41:45 [place], [place].  ([id number]) -------------------------------------------------------------------------------- Pain Assessment Details Patient Name: Sisemore, Kydan L. Date of Service: 06/24/2016 1:30 PM Medical Record Number: [id number] Patient Account Number: [id number] Date of Birth/Sex: 03-07-1923 (83 y.o. Male) Treating RN: Montey Hora Primary Care Chrysten Woulfe: [facility], Dossie Der Other Clinician: Referring Stacey Maura: [facility], [facility] Treating Lona Six/Extender: Tito Dine in Treatment: 3 Active Problems Location of Pain Severity and Description of Pain Patient Has Paino No Site Locations Pain Management and Medication Current Pain Management: Notes Topical or injectable lidocaine is offered to patient for acute pain when surgical debridement is performed. If needed, Patient is instructed to use over the counter pain medication for the following 24-48 hours after debridement. Wound care MDs do not prescribed pain medications. Patient has chronic pain or uncontrolled pain. Patient has been instructed to make an appointment with their Primary Care Physician for pain management. Electronic Signature(s) Signed: 06/24/2016 5:11:41 PM By: Montey Hora Entered By: Montey Hora on 06/24/2016 13:34:07 Lozano, Kuba Carlean Jews ([id number]) -------------------------------------------------------------------------------- Patient/Caregiver Education Details Patient Name: VANVB,  [place] L. Date of Service: 06/24/2016 1:30 PM Medical Record Number: [id number] Patient Account Number: [id number] Date of Birth/Gender: 06-30-1923 (83 y.o. Male) Treating RN: Montey Hora Primary Care Physician: [facility], [facility] Other Clinician: Referring Physician: [facility], [facility] Treating Physician/Extender: Tito Dine in Treatment: 3 Education Assessment Education Provided To: Patient and Caregiver Education Topics Provided Wound/Skin Impairment: Handouts: Other: wound care as ordered Methods: Demonstration, Explain/Verbal Responses: State  content correctly Electronic Signature(s) Signed: 06/24/2016 5:11:41 PM By: Montey Hora Entered By: Montey Hora on 06/24/2016 14:11:54 Forrester, Iokepa L. ([id number]) -------------------------------------------------------------------------------- Wound Assessment Details Patient Name: Wampole, Onur L. Date of Service: 06/24/2016 1:30 PM Medical Record Number: [id number] Patient Account Number: [id number] Date of Birth/Sex: 1923/04/24 (83 y.o. Male) Treating RN: Montey Hora Primary Care Aaima Gaddie: [facility], [facility] Other Clinician: Referring Twanisha Foulk: [facility], [facility] Treating Granger Chui/Extender: Tito Dine in Treatment: 3 Wound Status Wound Number: 8 Primary Trauma, Other Etiology: Wound Location: Left Upper Arm - Lateral Wound Open Wounding Event: Trauma Status: Date Acquired: 05/19/2016 Comorbid Cataracts, Chronic sinus Weeks Of Treatment: 3 History: problems/congestion, Sleep Apnea, Clustered Wound: Yes Coronary Artery Disease, Osteoarthritis, Received Chemotherapy Photos Photo Uploaded By: Montey Hora on 06/24/2016 17:10:44 Wound Measurements Length: (cm) 1.1 Width: (cm) 1.2 Depth: (cm) 0.1 Clustered Quantity: 4 Area: (cm) 1.037 Volume: (cm) 0.104 % Reduction in Area: 98.3% % Reduction in Volume: 99.2% Epithelialization: Large (67-100%) Tunneling: No Undermining: No Wound Description Classification: Partial Thickness Foul Odor Afte Wound Margin: Flat and Intact Slough/Fibrino Exudate Amount: None Present r Cleansing: No No Wound Bed Granulation Amount: None Present (0%) Exposed Structure Necrotic Amount: None Present (0%) Fascia Exposed: No Fat Layer (Subcutaneous Tissue) Exposed: No Tendon Exposed: No Nile, Westley L. ([id number]) Muscle Exposed: No Joint Exposed: No Bone Exposed: No Limited to Skin Breakdown Periwound Skin Texture Texture Color No Abnormalities Noted: No No Abnormalities Noted: No Callus: No Atrophie Blanche: No Crepitus:  No Cyanosis: No Excoriation: No Ecchymosis: No Induration: No Erythema: No Rash: No Hemosiderin Staining: No Scarring: No Mottled: No Pallor: No Moisture Rubor: No No Abnormalities Noted: No Dry / Scaly: No Maceration: No Wound Preparation Ulcer Cleansing: Rinsed/Irrigated with Saline Topical Anesthetic Applied: None Assessment Notes blistered area Treatment Notes Wound #8 (Left, Lateral Upper Arm) 1. Cleansed with: Clean wound with Normal Saline 4. Dressing Applied: Prisma Ag Other dressing (specify in notes) Notes coverlet Electronic Signature(s) Signed: 06/24/2016 5:11:41 PM By: Montey Hora Entered By: Montey Hora on 06/24/2016 13:40:45 [place], [place] ([id number]) -------------------------------------------------------------------------------- Vitals Details Patient Name: Devera, Shady L. Date of Service: 06/24/2016 1:30 PM Medical Record Number: [id number] Patient Account Number: [id number] Date of Birth/Sex: [birth year] (83 y.o. Male) Treating RN: Montey Hora Primary Care Desirie Minteer: [facility], [facility] Other Clinician: Referring Ryenne Lynam: [facility], [facility] Treating Rosamond Andress/Extender: Tito Dine in Treatment: 3 Vital Signs Time Taken: 13:34 Temperature (F): 97.7 Height (in): 71 Pulse (bpm): 75 Weight (lbs): 183 Respiratory Rate (breaths/min): 18 Body Mass Index (BMI): 25.5 Blood Pressure (mmHg): 117/52 Reference Range: 80 - 120 mg / dl Electronic Signature(s) Signed: 06/24/2016 5:11:41 PM By: Montey Hora Entered By: Montey Hora on 06/24/2016 13:35:09

## 2016-06-29 ENCOUNTER — Telehealth: Payer: Self-pay | Admitting: Family Medicine

## 2016-06-29 NOTE — Telephone Encounter (Signed)
PT NEEDS REFILL ON METOPROLOL 25MG . PHARM IS [facility] ON [address] PHARM REQUESTED THIS LAST WEEK.

## 2016-07-01 ENCOUNTER — Encounter: Payer: Medicare Other | Admitting: Internal Medicine

## 2016-07-01 ENCOUNTER — Other Ambulatory Visit: Payer: Self-pay | Admitting: Emergency Medicine

## 2016-07-01 ENCOUNTER — Other Ambulatory Visit: Payer: Self-pay | Admitting: Family Medicine

## 2016-07-01 DIAGNOSIS — I2510 Atherosclerotic heart disease of native coronary artery without angina pectoris: Secondary | ICD-10-CM

## 2016-07-01 DIAGNOSIS — S41112D Laceration without foreign body of left upper arm, subsequent encounter: Secondary | ICD-10-CM | POA: Diagnosis not present

## 2016-07-01 MED ORDER — METOPROLOL TARTRATE 25 MG PO TABS: 12.5000 mg | ORAL_TABLET | Freq: Two times a day (BID) | ORAL | 0 refills | Status: DC

## 2016-07-01 NOTE — Telephone Encounter (Signed)
Script sent to pharmacy.

## 2016-07-03 NOTE — Progress Notes (Signed)
SAMARTH, OGLE ([id number]) Visit Report for 07/01/2016 Arrival Information Details Patient Name: [name], [name] Date of Service: 07/01/2016 3:30 PM Medical Record Number: [id number] Patient Account Number: [id number] Date of Birth/Sex: 02-Sep-1923 (83 y.o. Male) Treating RN: Cornell Barman Primary Care Souleymane Saiki: [facility], [facility] Other Clinician: Referring Kamariyah Timberlake: [facility], [facility]ED Treating Wardell Pokorski/Extender: Tito Dine in Treatment: 4 Visit Information History Since Last Visit Added or deleted any medications: No Patient Arrived: Walker Any new allergies or adverse reactions: No Arrival Time: 15:36 Had a fall or experienced change in No Accompanied By: wife activities of daily living that may affect Transfer Assistance: None risk of falls: Patient Identification Verified: Yes Signs or symptoms of abuse/neglect since last No Secondary Verification Process Completed: Yes visito Hospitalized since last visit: No Has Dressing in Place as Prescribed: Yes Pain Present Now: No Electronic Signature(s) Signed: 07/02/2016 9:24:36 AM By: Gretta Cool, RN, BSN, Kim RN, BSN Entered By: Gretta Cool, RN, BSN, Kim on 07/01/2016 15:37:10 [place], [place]. ([id number]) -------------------------------------------------------------------------------- Clinic Level of Care Assessment Details Patient Name: [name], [name] Date of Service: 07/01/2016 3:30 PM Medical Record Number: [id number] Patient Account Number: [id number] Date of Birth/Sex: [birth year] (83 y.o. Male) Treating RN: Cornell Barman Primary Care Kailea Dannemiller: [facility], SYED Other Clinician: Referring Maryl Blalock: [facility], SYED Treating Suly Vukelich/Extender: Tito Dine in Treatment: 4 Clinic Level of Care Assessment Items TOOL 4 Quantity Score []  - Use when only an EandM is performed on FOLLOW-UP visit 0 ASSESSMENTS - Nursing Assessment / Reassessment []  - Reassessment of Co-morbidities (includes updates in patient status) 0 []  - Reassessment of Adherence to  Treatment Plan 0 ASSESSMENTS - Wound and Skin Assessment / Reassessment X - Simple Wound Assessment / Reassessment - one wound 1 5 []  - Complex Wound Assessment / Reassessment - multiple wounds 0 []  - Dermatologic / Skin Assessment (not related to wound area) 0 ASSESSMENTS - Focused Assessment []  - Circumferential Edema Measurements - multi extremities 0 []  - Nutritional Assessment / Counseling / Intervention 0 []  - Lower Extremity Assessment (monofilament, tuning fork, pulses) 0 []  - Peripheral Arterial Disease Assessment (using hand held doppler) 0 ASSESSMENTS - Ostomy and/or Continence Assessment and Care []  - Incontinence Assessment and Management 0 []  - Ostomy Care Assessment and Management (repouching, etc.) 0 PROCESS - Coordination of Care X - Simple Patient / Family Education for ongoing care 1 15 []  - Complex (extensive) Patient / Family Education for ongoing care 0 X - Staff obtains Programmer, systems, Records, Test Results / Process Orders 1 10 []  - Staff telephones HHA, Nursing Homes / Clarify orders / etc 0 []  - Routine Transfer to another Facility (non-emergent condition) 0 Weidinger, [name] ([id number]) []  - Routine Hospital Admission (non-emergent condition) 0 []  - New Admissions / Biomedical engineer / Ordering NPWT, Apligraf, etc. 0 []  - Emergency Hospital Admission (emergent condition) 0 X - Simple Discharge Coordination 1 10 []  - Complex (extensive) Discharge Coordination 0 PROCESS - Special Needs []  - Pediatric / Minor Patient Management 0 []  - Isolation Patient Management 0 []  - Hearing / Language / Visual special needs 0 []  - Assessment of Community assistance (transportation, D/C planning, etc.) 0 []  - Additional assistance / Altered mentation 0 []  - Support Surface(s) Assessment (bed, cushion, seat, etc.) 0 INTERVENTIONS - Wound Cleansing / Measurement X - Simple Wound Cleansing - one wound 1 5 []  - Complex Wound Cleansing - multiple wounds 0 X - Wound Imaging  (photographs - any number of wounds) 1 5 []  - Wound Tracing (instead  of photographs) 0 X - Simple Wound Measurement - one wound 1 5 []  - Complex Wound Measurement - multiple wounds 0 INTERVENTIONS - Wound Dressings X - Small Wound Dressing one or multiple wounds 1 10 []  - Medium Wound Dressing one or multiple wounds 0 []  - Large Wound Dressing one or multiple wounds 0 []  - Application of Medications - topical 0 []  - Application of Medications - injection 0 INTERVENTIONS - Miscellaneous []  - External ear exam 0 [name], [name] ([id number]) []  - Specimen Collection (cultures, biopsies, blood, body fluids, etc.) 0 []  - Specimen(s) / Culture(s) sent or taken to Lab for analysis 0 []  - Patient Transfer (multiple staff / Harrel Lemon Lift / Similar devices) 0 []  - Simple Staple / Suture removal (25 or less) 0 []  - Complex Staple / Suture removal (26 or more) 0 []  - Hypo / Hyperglycemic Management (close monitor of Blood Glucose) 0 []  - Ankle / Brachial Index (ABI) - do not check if billed separately 0 X - Vital Signs 1 5 Has the patient been seen at the hospital within the last three years: Yes Total Score: 70 Level Of Care: New/Established - Level 2 Electronic Signature(s) Signed: 07/02/2016 9:24:36 AM By: Gretta Cool, RN, BSN, Kim RN, BSN Entered By: Gretta Cool, RN, BSN, Kim on 07/01/2016 15:54:41 [place], [place]. ([id number]) -------------------------------------------------------------------------------- Encounter Discharge Information Details Patient Name: [name], [name] Date of Service: 07/01/2016 3:30 PM Medical Record Number: [id number] Patient Account Number: [id number] Date of Birth/Sex: 08-09-1923 (83 y.o. Male) Treating RN: Montey Hora Primary Care Kelcee Bjorn: [facility], [facility] Other Clinician: Referring Cillian Gwinner: [facility], [facility]ED Treating Manami Tutor/Extender: Tito Dine in Treatment: 4 Encounter Discharge Information Items Discharge Pain Level: 0 Discharge Condition: Stable Ambulatory Status:  Walker Discharge Destination: Home Transportation: Private Auto Accompanied By: wife Schedule Follow-up Appointment: Yes Medication Reconciliation completed Yes and provided to Patient/Care Orel Hord: Provided on Clinical Summary of Care: 07/01/2016 Form Type Recipient Paper Patient AS Electronic Signature(s) Signed: 07/01/2016 4:06:15 PM By: Gretta Cool RN, BSN, Kim RN, BSN Previous Signature: 07/01/2016 3:55:38 PM Version By: Ruthine Dose Entered By: Gretta Cool RN, BSN, Kim on 07/01/2016 16:06:15 [place], [place]. ([id number]) -------------------------------------------------------------------------------- Lower Extremity Assessment Details Patient Name: Girgis, Amer L. Date of Service: 07/01/2016 3:30 PM Medical Record Number: [id number] Patient Account Number: [id number] Date of Birth/Sex: 08-11-1923 (83 y.o. Male) Treating RN: Cornell Barman Primary Care Lashara Urey: [facility], [place] Other Clinician: Referring Makynlie Rossini: [facility], [place] Treating Rayshaun Needle/Extender: Tito Dine in Treatment: 4 Electronic Signature(s) Signed: 07/02/2016 9:24:36 AM By: Gretta Cool RN, BSN, Kim RN, BSN Entered By: Gretta Cool, RN, BSN, Kim on 07/01/2016 15:49:47 [place], [place] ([id number]) -------------------------------------------------------------------------------- Multi Wound Chart Details Patient Name: [name], [name] Date of Service: 07/01/2016 3:30 PM Medical Record Number: [id number] Patient Account Number: [id number] Date of Birth/Sex: August 28, 1923 (83 y.o. Male) Treating RN: Cornell Barman Primary Care Anelisse Jacobson: [facility], [place] Other Clinician: Referring Meleny Tregoning: [facility], [facility] Treating Jorel Gravlin/Extender: Tito Dine in Treatment: 4 Vital Signs Height(in): 71 Pulse(bpm): 79 Weight(lbs): 183 Blood Pressure 113/46 (mmHg): Body Mass Index(BMI): 26 Temperature(F): 97.6 Respiratory Rate 16 (breaths/min): Photos: [N/A:N/A] Wound Location: Left Upper Arm - Lateral N/A N/A Wounding Event: Trauma N/A N/A Primary  Etiology: Trauma, Other N/A N/A Comorbid History: Cataracts, Chronic sinus N/A N/A problems/congestion, Sleep Apnea, Coronary Artery Disease, Osteoarthritis, Received Chemotherapy Date Acquired: 05/19/2016 N/A N/A Weeks of Treatment: 4 N/A N/A Wound Status: Open N/A N/A Clustered Wound: Yes N/A N/A Clustered Quantity: 4 N/A N/A Measurements L x W x D 0.8x0.9x0.1 N/A N/A (  cm) Area (cm) : 0.565 N/A N/A Volume (cm) : 0.057 N/A N/A % Reduction in Area: 99.10% N/A N/A % Reduction in Volume: 99.60% N/A N/A Classification: Partial Thickness N/A N/A Exudate Amount: None Present N/A N/A Wound Margin: Flat and Intact N/A N/A Granulation Amount: Large (67-100%) N/A N/A [name], [name] ([id number]) Granulation Quality: Red N/A N/A Necrotic Amount: None Present (0%) N/A N/A Exposed Structures: Fascia: No N/A N/A Fat Layer (Subcutaneous Tissue) Exposed: No Tendon: No Muscle: No Joint: No Bone: No Limited to Skin Breakdown Epithelialization: Large (67-100%) N/A N/A Periwound Skin Texture: Excoriation: Yes N/A N/A Induration: No Callus: No Crepitus: No Rash: No Scarring: No Periwound Skin Maceration: No N/A N/A Moisture: Dry/Scaly: No Periwound Skin Color: Atrophie Blanche: No N/A N/A Cyanosis: No Ecchymosis: No Erythema: No Hemosiderin Staining: No Mottled: No Pallor: No Rubor: No Tenderness on No N/A N/A Palpation: Wound Preparation: Ulcer Cleansing: N/A N/A Rinsed/Irrigated with Saline Topical Anesthetic Applied: None Treatment Notes Wound #8 (Left, Lateral Upper Arm) 1. Cleansed with: Clean wound with Normal Saline 4. Dressing Applied: Prisma Ag Notes coverlet Electronic Signature(s) BRENSON, HARTMAN ([id number]) Signed: 07/01/2016 4:43:34 PM By: Linton Ham MD Entered By: Linton Ham on 07/01/2016 16:40:18 [name], Brandon LMarland Kitchen ([id number]) -------------------------------------------------------------------------------- Multi-Disciplinary Care Plan  Details Patient Name: [name], [name] Date of Service: 07/01/2016 3:30 PM Medical Record Number: [id number] Patient Account Number: [id number] Date of Birth/Sex: 18-Feb-1923 (84 y.o. Male) Treating RN: Cornell Barman Primary Care Glenice Ciccone: [facility], [place] Other Clinician: Referring Keavon Sensing: [facility], SYED Treating Cherree Conerly/Extender: Tito Dine in Treatment: 4 Active Inactive ` Abuse / Safety / Falls / Self Care Management Nursing Diagnoses: Impaired physical mobility Potential for falls Goals: Patient will remain injury free Date Initiated: 06/02/2016 Target Resolution Date: 06/09/2016 Goal Status: Active Patient/caregiver will verbalize understanding of skin care regimen Date Initiated: 06/02/2016 Target Resolution Date: 06/09/2016 Goal Status: Active Interventions: Assess fall risk on admission and as needed Notes: ` Necrotic Tissue Nursing Diagnoses: Impaired tissue integrity related to necrotic/devitalized tissue Goals: Necrotic/devitalized tissue will be minimized in the wound bed Date Initiated: 06/02/2016 Target Resolution Date: 06/09/2016 Goal Status: Active Interventions: Assess patient pain level pre-, during and post procedure and prior to discharge Treatment Activities: Apply topical anesthetic as ordered : 06/02/2016 Brandon, [name] ([id number]) Notes: ` Orientation to the Wound Care Program Nursing Diagnoses: Knowledge deficit related to the wound healing center program Goals: Patient/caregiver will verbalize understanding of the [place] Date Initiated: 06/02/2016 Target Resolution Date: 06/09/2016 Goal Status: Active Interventions: Provide education on orientation to the wound center Notes: ` Soft Tissue Infection Nursing Diagnoses: Impaired tissue integrity Knowledge deficit related to disease process and management Potential for infection: soft tissue Goals: Patient will remain free of wound infection Date Initiated:  06/02/2016 Target Resolution Date: 06/09/2016 Goal Status: Active Signs and symptoms of infection will be recognized early to allow for prompt treatment Date Initiated: 06/02/2016 Target Resolution Date: 06/09/2016 Goal Status: Active Interventions: Assess signs and symptoms of infection every visit Notes: ` Wound/Skin Impairment Nursing Diagnoses: Impaired tissue integrity Goals: Ulcer/skin breakdown will heal within 14 weeks Brandon, [name] ([id number]) Date Initiated: 06/02/2016 Target Resolution Date: 10/02/2016 Goal Status: Active Interventions: Assess ulceration(s) every visit Notes: Electronic Signature(s) Signed: 07/02/2016 9:24:36 AM By: Gretta Cool, RN, BSN, Kim RN, BSN Entered By: Gretta Cool, RN, BSN, Kim on 07/01/2016 15:50:22 [place], Koleman L. ([id number]) -------------------------------------------------------------------------------- Pain Assessment Details Patient Name: [name], [name] Date of Service: 07/01/2016 3:30 PM Medical Record Number: [id number] Patient Account Number:  [id number] Date of Birth/Sex: [birth year] (83 y.o. Male) Treating RN: Cornell Barman Primary Care Mairany Bruno: [facility], [place] Other Clinician: Referring Sparkle Aube: [facility], SYED Treating Charlis Harner/Extender: Tito Dine in Treatment: 4 Active Problems Location of Pain Severity and Description of Pain Patient Has Paino Yes Site Locations Pain Location: Generalized Pain Pain Management and Medication Current Pain Management: Goals for Pain Management PAIN IN BACK. Topical or injectable lidocaine is offered to patient for acute pain when surgical debridement is performed. If needed, Patient is instructed to use over the counter pain medication for the following 24-48 hours after debridement. Wound care MDs do not prescribed pain medications. Patient has chronic pain or uncontrolled pain. Patient has been instructed to make an appointment with their Primary Care Physician for pain management. Electronic  Signature(s) Signed: 07/02/2016 9:24:36 AM By: Gretta Cool, RN, BSN, Kim RN, BSN Entered By: Gretta Cool, RN, BSN, Kim on 07/01/2016 15:37:41 [place], Tonia [place] ([id number]) -------------------------------------------------------------------------------- Patient/Caregiver Education Details Patient Name: Rebman, Gatlin L. Date of Service: 07/01/2016 3:30 PM Medical Record Number: [id number] Patient Account Number: [id number] Date of Birth/Gender: 1923-03-02 (83 y.o. Male) Treating RN: Cornell Barman Primary Care Physician: [facility], [place] Other Clinician: Referring Physician: [facility], [place] Treating Physician/Extender: Tito Dine in Treatment: 4 Education Assessment Education Provided To: Patient Education Topics Provided Wound/Skin Impairment: Handouts: Caring for Your Ulcer Methods: Demonstration Responses: State content correctly Electronic Signature(s) Signed: 07/01/2016 4:08:54 PM By: Gretta Cool, RN, BSN, Kim RN, BSN Entered By: Gretta Cool, RN, BSN, Kim on 07/01/2016 16:08:53 [place], [place]. ([id number]) -------------------------------------------------------------------------------- Wound Assessment Details Patient Name: [name], [name] Date of Service: 07/01/2016 3:30 PM Medical Record Number: [id number] Patient Account Number: [id number] Date of Birth/Sex: [birth year] (83 y.o. Male) Treating RN: Cornell Barman Primary Care Tarik Teixeira: [facility], [facility]ED Other Clinician: Referring Arasely Akkerman: [facility], SYED Treating Kashtyn Jankowski/Extender: Tito Dine in Treatment: 4 Wound Status Wound Number: 8 Primary Trauma, Other Etiology: Wound Location: Left Upper Arm - Lateral Wound Open Wounding Event: Trauma Status: Date Acquired: 05/19/2016 Comorbid Cataracts, Chronic sinus Weeks Of Treatment: 4 History: problems/congestion, Sleep Apnea, Clustered Wound: Yes Coronary Artery Disease, Osteoarthritis, Received Chemotherapy Photos Photo Uploaded By: Gretta Cool, RN, BSN, Kim on 07/01/2016 16:10:10 Wound Measurements Length: (cm)  0.8 Width: (cm) 0.9 Depth: (cm) 0.1 Clustered Quantity: 4 Area: (cm) 0.565 Volume: (cm) 0.057 % Reduction in Area: 99.1% % Reduction in Volume: 99.6% Epithelialization: Large (67-100%) Tunneling: No Undermining: No Wound Description Classification: Partial Thickness Foul Odor Afte Wound Margin: Flat and Intact Slough/Fibrino Exudate Amount: None Present r Cleansing: No No Wound Bed Granulation Amount: Large (67-100%) Exposed Structure Granulation Quality: Red Fascia Exposed: No Necrotic Amount: None Present (0%) Fat Layer (Subcutaneous Tissue) Exposed: No Tendon Exposed: No Muscle Exposed: No Joint Exposed: No [name], [name] ([id number]) Bone Exposed: No Limited to Skin Breakdown Periwound Skin Texture Texture Color No Abnormalities Noted: No No Abnormalities Noted: No Callus: No Atrophie Blanche: No Crepitus: No Cyanosis: No Excoriation: Yes Ecchymosis: No Induration: No Erythema: No Rash: No Hemosiderin Staining: No Scarring: No Mottled: No Pallor: No Moisture Rubor: No No Abnormalities Noted: No Dry / Scaly: No Maceration: No Wound Preparation Ulcer Cleansing: Rinsed/Irrigated with Saline Topical Anesthetic Applied: None Treatment Notes Wound #8 (Left, Lateral Upper Arm) 1. Cleansed with: Clean wound with Normal Saline 4. Dressing Applied: Prisma Ag Notes coverlet Electronic Signature(s) Signed: 07/02/2016 9:24:36 AM By: Gretta Cool, RN, BSN, Kim RN, BSN Entered By: Gretta Cool, RN, BSN, Kim on 07/01/2016 15:41:21 [place], [place] ([id number]) -------------------------------------------------------------------------------- Vitals Details Patient Name: Pickron, Roderick L. Date  of Service: 07/01/2016 3:30 PM Medical Record Number: [id number] Patient Account Number: [id number] Date of Birth/Sex: [birth year] (83 y.o. Male) Treating RN: Cornell Barman Primary Care Elnita Surprenant: [facility], SYED Other Clinician: Referring Brantleigh Mifflin: [name], SYED Treating Toyoko Silos/Extender: Tito Dine in Treatment: 4 Vital Signs Time Taken: 15:37 Temperature (F): 97.6 Height (in): 71 Pulse (bpm): 79 Weight (lbs): 183 Respiratory Rate (breaths/min): 16 Body Mass Index (BMI): 25.5 Blood Pressure (mmHg): 113/46 Reference Range: 80 - 120 mg / dl Electronic Signature(s) Signed: 07/02/2016 9:24:36 AM By: Gretta Cool, RN, BSN, Kim RN, BSN Entered By: Gretta Cool, RN, BSN, Kim on 07/01/2016 15:38:43

## 2016-07-03 NOTE — Progress Notes (Signed)
[name] ([id number]) Visit Report for 07/01/2016 Chief Complaint Document Details Patient Name: [name], [name] Date of Service: 07/01/2016 3:30 PM Medical Record Number: [id number] Patient Account Number: [id number] Date of Birth/Sex: [birth year] (83 y.o. Male) Treating RN: Montey Hora Primary Care Provider: [facility], Dossie Der Other Clinician: Referring Provider: [facility], [facility] Treating Provider/Extender: Tito Dine in Treatment: 4 Information Obtained from: Patient Chief Complaint Patient presents to the wound care center for a consult due non healing wound to his right elbow, left knee, left upper arm and left upper back which she's had for about a week Electronic Signature(s) Signed: 07/01/2016 4:43:34 PM By: Linton Ham MD Entered By: Linton Ham on 07/01/2016 16:40:25 [name], [name] ([id number]) -------------------------------------------------------------------------------- HPI Details Patient Name: [name], [name] Date of Service: 07/01/2016 3:30 PM Medical Record Number: [id number] Patient Account Number: [id number] Date of Birth/Sex: 20-Dec-1923 (83 y.o. Male) Treating RN: Montey Hora Primary Care Provider: [facility], [place]ossie Der Other Clinician: Referring Provider: [facility], [facility] Treating Provider/Extender: Tito Dine in Treatment: 4 History of Present Illness Location: right elbow, left knee, left upper arm and left upper back Quality: Patient reports experiencing a dull pain to affected area(s). Severity: Patient states wound (s) are getting better. Duration: Patient has had the wound for < 1 weeks prior to presenting for treatment Timing: Pain in wound is Intermittent (comes and goes Context: The wound occurred when the patient had a fall and hurt his back Modifying Factors: Other treatment(s) tried include:local care with Neosporin ointment Associated Signs and Symptoms: Patient reports having:scar tissue opens out every time they do dressing HPI  Description: 83 year old patient who was in [place] in May a couple of times for a lacerated wound which was healed over a couple of weeks. He now comes with recent injuries to his back and knees and both arms sustained after a fall. He did not seek any medical help and they use dressing material which was previously there. Past medical history significant for carotid stenosis, [name], [name], dyslipidemia, hypertension, anxiety, GERD, multiple myeloma, BPH, inguinal hernia and is status post appendectomy, hip fracture, cardiac catheterization. He smoked in the remote past and has not smoked for several years. He has had long-term low back pain and was evaluated for pain radiating to the left groin. 02-06-16 [name] progress for evaluation of multiple skin tears, he is accompanied by his wife. Neither patient nor his wife expressed the concerns regarding his wounds and/or the treatment plan. They deny any falls since his last appointment or any new injuries. READMISSION this is a 83 year old man who is been seen in the clinic in 2017 for multiple skin tears after falling. These were fairly easy to close over unfortunately the patient's recent course is not gone well. He fell and suffered a right hip fracture at the beginning of March. He was discharged to a skilled facility and sometime during the stay at the skilled facility his daughter noted a bandage on his left elbow. It wasn't told later that they learned he had fallen out of bed sustaining an injury. After discharge a week ago he apparently had a off- balance issue falling against the wall and hit the left elbow again. Been using peroxide and Neosporin. Seen by the family doctor yesterday and x-ray of the left elbow was negative. He was put on doxycycline. The patient has a history of coronary artery disease status post stent, [name], hypertension, lumbar spinal stenosis, multiple myeloma and BPH. He is apparently had bilateral  hip replacements secondary to falling with the most recent one being on the right. He states that on the times he has fallen he has got up to go to the bathroom and becomes "swimmy headed" although he does not describe the off-balance sensation were reproducibly with changes in position 06/10/16; left elbow wounds look improved this is on the posterior aspect over the olecranon area. Healthy- [name], [name] ([id number]) looking granulation. The Prisma apparently stuck to the wound bed at least as being applied by home health. We changed to [facility]. 06/17/16; the areas over the left elbow look improved. There are 2 small open areas. Light surface debridement to remove surface eschar. 06/24/16; left elbow as predicted last week has epithelialized as predicted HOWEVER today he arrives with blisters in a perfect distribution limited to the wound. 07/01/16; the entire area of his elbow looks better this week. Only one open area remains Electronic Signature(s) Signed: 07/01/2016 4:43:34 PM By: Linton Ham MD Entered By: Linton Ham on 07/01/2016 [name], [name] ([id number]) -------------------------------------------------------------------------------- Physical Exam Details Patient Name: [name], [name] Date of Service: 07/01/2016 3:30 PM Medical Record Number: [id number] Patient Account Number: [id number] Date of Birth/Sex: Jun 28, 1923 (83 y.o. Male) Treating RN: Montey Hora Primary Care Provider: [facility], Dossie Der Other Clinician: Referring Provider: [facility], [facility] Treating Provider/Extender: Tito Dine in Treatment: 4 Constitutional Sitting or standing Blood Pressure is within target range for patient.. Pulse regular and within target range for patient.Marland Kitchen Respirations regular, non-labored and within target range.. Temperature is normal and within the target range for the patient.. Patient's appearance is neat and clean. Appears in no acute distress. Well nourished and well  developed.. Notes Wound exam; 1 area remains opened Electronic Signature(s) Signed: 07/01/2016 4:43:34 PM By: Linton Ham MD Entered By: Linton Ham on 07/01/2016 16:41:28 [name], [name] ([id number]) -------------------------------------------------------------------------------- Physician Orders Details Patient Name: [name], [name] Date of Service: 07/01/2016 3:30 PM Medical Record Number: [id number] Patient Account Number: [id number] Date of Birth/Sex: 09-Mar-1923 (83 y.o. Male) Treating RN: Cornell Barman Primary Care Provider: [facility], Dossie Der Other Clinician: Referring Provider: [facility], [facility] Treating Provider/Extender: Tito Dine in Treatment: 4 Verbal / Phone Orders: No Diagnosis Coding Wound Cleansing Wound #8 Left,Lateral Upper Arm o Clean wound with wound cleanser. Anesthetic Wound #8 Left,Lateral Upper Arm o Topical Lidocaine 4% cream applied to wound bed prior to debridement Primary Wound Dressing Wound #8 Left,Lateral Upper Arm o Prisma Ag Secondary Dressing Wound #8 Left,Lateral Upper Arm o Other - COVERLET Dressing Change Frequency Wound #8 Left,Lateral Upper Arm o Three times weekly - Once in clinic Follow-up Appointments Wound #8 Left,Lateral Upper Arm o Return Appointment in 1 week. Home Health Wound #8 Left,Lateral Upper Arm o [place] Visits - [place] Nurse may visit PRN to address patientos wound care needs. o FACE TO FACE ENCOUNTER: MEDICARE and MEDICAID PATIENTS: I certify that this patient is under my care and that I had a face-to-face encounter that meets the physician face-to-face encounter requirements with this patient on this date. The encounter with the patient was in whole or in part for the following MEDICAL CONDITION: (primary reason for [place]) MEDICAL NECESSITY: I certify, that based on my findings, NURSING services are a medically necessary home health service. HOME BOUND STATUS:  I certify that my clinical findings support that this patient is homebound (i.e., Due to illness or injury, pt requires aid of Mitnick, [place] L. ([id number]) supportive devices such as crutches, cane, wheelchairs, walkers,  the use of special transportation or the assistance of another person to leave their place of residence. There is a normal inability to leave the home and doing so requires considerable and taxing effort. Other absences are for medical reasons / religious services and are infrequent or of short duration when for other reasons). o If current dressing causes regression in wound condition, may D/C ordered dressing product/s and apply Normal Saline Moist Dressing daily until next [place] / Other MD appointment. [place] of regression in wound condition at [phone]. o Please direct any NON-WOUND related issues/requests for orders to patient's Primary Care Physician Electronic Signature(s) Signed: 07/01/2016 4:43:34 PM By: Linton Ham MD Signed: 07/02/2016 9:24:36 AM By: Gretta Cool RN, BSN, Kim RN, BSN Entered By: Gretta Cool, RN, BSN, Kim on 07/01/2016 15:51:08 [place], [place] ([id number]) -------------------------------------------------------------------------------- Problem List Details Patient Name: [name], [name] Date of Service: 07/01/2016 3:30 PM Medical Record Number: [id number] Patient Account Number: [id number] Date of Birth/Sex: 10-Jul-1923 (82 y.o. Male) Treating RN: Montey Hora Primary Care Provider: [facility], Dossie Der Other Clinician: Referring Provider: [facility], [facility] Treating Provider/Extender: Tito Dine in Treatment: 4 Active Problems ICD-10 Encounter Code Description Active Date Diagnosis S41.112D Laceration without foreign body of left upper arm, 06/02/2016 Yes subsequent encounter S41.111D Laceration without foreign body of right upper arm, 06/02/2016 Yes subsequent encounter I95.2 Hypotension due to drugs 06/02/2016  Yes Inactive Problems Resolved Problems Electronic Signature(s) Signed: 07/01/2016 4:43:34 PM By: Linton Ham MD Entered By: Linton Ham on 07/01/2016 16:40:06 [place], Jaheim L. ([id number]) -------------------------------------------------------------------------------- Progress Note Details Patient Name: [name], [name] Date of Service: 07/01/2016 3:30 PM Medical Record Number: [id number] Patient Account Number: [id number] Date of Birth/Sex: 05/18/1923 (83 y.o. Male) Treating RN: Montey Hora Primary Care Provider: [facility], [facility] Other Clinician: Referring Provider: [facility], [facility] Treating Provider/Extender: Tito Dine in Treatment: 4 Subjective Chief Complaint Information obtained from Patient Patient presents to the wound care center for a consult due non healing wound to his right elbow, left knee, left upper arm and left upper back which she's had for about a week History of Present Illness (HPI) The following HPI elements were documented for the patient's wound: Location: right elbow, left knee, left upper arm and left upper back Quality: Patient reports experiencing a dull pain to affected area(s). Severity: Patient states wound (s) are getting better. Duration: Patient has had the wound for < 1 weeks prior to presenting for treatment Timing: Pain in wound is Intermittent (comes and goes Context: The wound occurred when the patient had a fall and hurt his back Modifying Factors: Other treatment(s) tried include:local care with Neosporin ointment Associated Signs and Symptoms: Patient reports having:scar tissue opens out every time they do dressing 83 year old patient who was in [place] in May a couple of times for a lacerated wound which was healed over a couple of weeks. He now comes with recent injuries to his back and knees and both arms sustained after a fall. He did not seek any medical help and they use dressing material which was previously there. Past medical  history significant for carotid stenosis, [name], [name], dyslipidemia, hypertension, anxiety, GERD, multiple myeloma, BPH, inguinal hernia and is status post appendectomy, hip fracture, cardiac catheterization. He smoked in the remote past and has not smoked for several years. He has had long-term low back pain and was evaluated for pain radiating to the left groin. 02-06-16 Mr. Longoria progress for evaluation of multiple skin tears, he is accompanied by his  wife. Neither patient nor his wife expressed the concerns regarding his wounds and/or the treatment plan. They deny any falls since his last appointment or any new injuries. READMISSION this is a 83 year old man who is been seen in the clinic in 2017 for multiple skin tears after falling. These were fairly easy to close over unfortunately the patient's recent course is not gone well. He fell and suffered a right hip fracture at the beginning of March. He was discharged to a skilled facility and sometime during the stay at the skilled facility his daughter noted a bandage on his left elbow. It wasn't told later that they learned he had fallen out of bed sustaining an injury. After discharge a week ago he apparently had a off- balance issue falling against the wall and hit the left elbow again. Been using peroxide and Neosporin. Seen by the family doctor yesterday and x-ray of the left elbow was negative. He was put on doxycycline. [name], [name] ([id number]) The patient has a history of coronary artery disease status post stent, [name], hypertension, lumbar spinal stenosis, multiple myeloma and BPH. He is apparently had bilateral hip replacements secondary to falling with the most recent one being on the right. He states that on the times he has fallen he has got up to go to the bathroom and becomes "swimmy headed" although he does not describe the off-balance sensation were reproducibly with changes in position 06/10/16; left elbow  wounds look improved this is on the posterior aspect over the olecranon area. Healthy- looking granulation. The Prisma apparently stuck to the wound bed at least as being applied by home health. We changed to [facility]. 06/17/16; the areas over the left elbow look improved. There are 2 small open areas. Light surface debridement to remove surface eschar. 06/24/16; left elbow as predicted last week has epithelialized as predicted HOWEVER today he arrives with blisters in a perfect distribution limited to the wound. 07/01/16; the entire area of his elbow looks better this week. Only one open area remains Objective Constitutional Sitting or standing Blood Pressure is within target range for patient.. Pulse regular and within target range for patient.Marland Kitchen Respirations regular, non-labored and within target range.. Temperature is normal and within the target range for the patient.. Patient's appearance is neat and clean. Appears in no acute distress. Well nourished and well developed.. Vitals Time Taken: 3:37 PM, Height: 71 in, Weight: 183 lbs, BMI: 25.5, Temperature: 97.6 F, Pulse: 79 bpm, Respiratory Rate: 16 breaths/min, Blood Pressure: 113/46 mmHg. General Notes: Wound exam; 1 area remains opened Integumentary (Hair, Skin) Wound #8 status is Open. Original cause of wound was Trauma. The wound is located on the Left,Lateral Upper Arm. The wound measures 0.8cm length x 0.9cm width x 0.1cm depth; 0.565cm^2 area and 0.057cm^3 volume. The wound is limited to skin breakdown. There is no tunneling or undermining noted. There is a none present amount of drainage noted. The wound margin is flat and intact. There is large (67- 100%) red granulation within the wound bed. There is no necrotic tissue within the wound bed. The periwound skin appearance exhibited: Excoriation. The periwound skin appearance did not exhibit: Callus, Crepitus, Induration, Rash, Scarring, Dry/Scaly, Maceration, Atrophie Blanche,  Cyanosis, Ecchymosis, Hemosiderin Staining, Mottled, Pallor, Rubor, Erythema. Assessment [name], [name] ([id number]) Active Problems ICD-10 S41.112D - Laceration without foreign body of left upper arm, subsequent encounter S41.111D - Laceration without foreign body of right upper arm, subsequent encounter I95.2 - Hypotension due to drugs Plan Wound  Cleansing: Wound #8 Left,Lateral Upper Arm: Clean wound with wound cleanser. Anesthetic: Wound #8 Left,Lateral Upper Arm: Topical Lidocaine 4% cream applied to wound bed prior to debridement Primary Wound Dressing: Wound #8 Left,Lateral Upper Arm: Prisma Ag Secondary Dressing: Wound #8 Left,Lateral Upper Arm: Other - COVERLET Dressing Change Frequency: Wound #8 Left,Lateral Upper Arm: Three times weekly - Once in clinic Follow-up Appointments: Wound #8 Left,Lateral Upper Arm: Return Appointment in 1 week. Home Health: Wound #8 Left,Lateral Upper Arm: Continue Home Health Visits - [place] Nurse may visit PRN to address patient s wound care needs. FACE TO FACE ENCOUNTER: MEDICARE and MEDICAID PATIENTS: I certify that this patient is under my care and that I had a face-to-face encounter that meets the physician face-to-face encounter requirements with this patient on this date. The encounter with the patient was in whole or in part for the following MEDICAL CONDITION: (primary reason for [place]) MEDICAL NECESSITY: I certify, that based on my findings, NURSING services are a medically necessary home health service. HOME BOUND STATUS: I certify that my clinical findings support that this patient is homebound (i.e., Due to illness or injury, pt requires aid of supportive devices such as crutches, cane, wheelchairs, walkers, the use of special transportation or the assistance of another person to leave their place of residence. There is a normal inability to leave the home and doing so requires considerable and taxing  effort. Other absences are for medical reasons / religious services and are infrequent or of short duration when for other reasons). If current dressing causes regression in wound condition, may D/C ordered dressing product/s and apply Normal Saline Moist Dressing daily until next [place] / Other MD appointment. [place] of regression in wound condition at [phone]. Please direct any NON-WOUND related issues/requests for orders to patient's Primary Care Physician [name], [name] ([id number]) #1 Prisma with a Band-Aid #2 this should be done next week. #3 I am not sure the reason why this deteriorated last week. Apparently a home health nurse named Merry Proud said that this was healed left the bandage off and then it deteriorated. I am not sure whether there is unintentional trauma here or what the issue is however this is almost closed Electronic Signature(s) Signed: 07/01/2016 4:43:34 PM By: Linton Ham MD Entered By: Linton Ham on 07/01/2016 16:42:21 [name], [name] ([id number]) -------------------------------------------------------------------------------- SuperBill Details Patient Name: Suttles, Aarian L. Date of Service: 07/01/2016 Medical Record Number: [id number] Patient Account Number: [id number] Date of Birth/Sex: 1923/05/25 (83 y.o. Male) Treating RN: Montey Hora Primary Care Provider: [facility], Dossie Der Other Clinician: Referring Provider: [facility], [facility] Treating Provider/Extender: Tito Dine in Treatment: 4 Diagnosis Coding ICD-10 Codes Code Description S41.112D Laceration without foreign body of left upper arm, subsequent encounter S41.111D Laceration without foreign body of right upper arm, subsequent encounter I95.2 Hypotension due to drugs Facility Procedures CPT4 Code: 25003704 Description: [id number] - WOUND CARE VISIT-LEV 2 EST PT Modifier: Quantity: 1 Physician Procedures CPT4 Code Description: [id number] - WC PHYS LEVEL 2 - EST  PT ICD-10 Description Diagnosis S41.112D Laceration without foreign body of left upper arm Modifier: , subsequent e Quantity: 1 ncounter Electronic Signature(s) Signed: 07/01/2016 4:43:34 PM By: Linton Ham MD Entered By: Linton Ham on 07/01/2016 16:42:36

## 2016-07-04 ENCOUNTER — Other Ambulatory Visit: Payer: Self-pay | Admitting: Family Medicine

## 2016-07-04 DIAGNOSIS — I2510 Atherosclerotic heart disease of native coronary artery without angina pectoris: Secondary | ICD-10-CM

## 2016-07-07 ENCOUNTER — Encounter: Payer: Self-pay | Admitting: Nurse Practitioner

## 2016-07-07 ENCOUNTER — Ambulatory Visit (INDEPENDENT_AMBULATORY_CARE_PROVIDER_SITE_OTHER): Payer: Medicare Other | Admitting: Nurse Practitioner

## 2016-07-07 VITALS — BP 122/60 | HR 78 | Ht 70.0 in | Wt 187.5 lb

## 2016-07-07 DIAGNOSIS — I10 Essential (primary) hypertension: Secondary | ICD-10-CM

## 2016-07-07 DIAGNOSIS — R42 Dizziness and giddiness: Secondary | ICD-10-CM

## 2016-07-07 DIAGNOSIS — I2510 Atherosclerotic heart disease of native coronary artery without angina pectoris: Secondary | ICD-10-CM

## 2016-07-07 DIAGNOSIS — E785 Hyperlipidemia, unspecified: Secondary | ICD-10-CM

## 2016-07-07 NOTE — Progress Notes (Signed)
Office Visit    Patient Name: [name] Date of Encounter: 07/07/2016  Primary Care Provider:  Roselee Nova, MD Primary Cardiologist:  Jerilynn Mages. Fletcher Anon, MD   Chief Complaint    83 year old male with a prior history of coronary artery disease, hypertension, hyperlipidemia, and lightheadedness who presents for follow-up related to lightheadedness and dizziness.  Past Medical History    Past Medical History:  Diagnosis Date  . Anxiety   . BPH (benign prostatic hypertrophy)   . Carotid stenosis    a. 05/2015 Carotid U/S: mild nonobs atherosclerosis. No need for f/u.  Marland Kitchen Chronic back pain   . Coronary artery disease    a. 06/2013 Cath/PCI: LAD 50-70p (FFR 0.82-->PCI w/ 3.5x24 Promus DES), RCA subtotally occluded w/ L->R collats, LCX 30ost.  . Dyslipidemia   . GERD (gastroesophageal reflux disease)   . Hx of [name]   . Hyperlipidemia   . Hypertension   . Inguinal hernia   . Lightheadedness    a. chronic, somewhat positional.  . 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 Major depression   . Multiple myeloma ([place])   . Pleural effusion    left  . Sleep apnea    Past Surgical History:  Procedure Laterality Date  . ANTERIOR APPROACH HEMI HIP ARTHROPLASTY Right 04/16/2016   Procedure: ANTERIOR APPROACH HEMI HIP ARTHROPLASTY;  Surgeon: Hessie Knows, MD;  Location: [facility] ORS;  Service: Orthopedics;  Laterality: Right;  . APPENDECTOMY    . CARDIAC CATHETERIZATION  2002  . CARDIAC CATHETERIZATION  06/2013  . COLONOSCOPY    . FEMUR FRACTURE SURGERY Left    with rod  . HIP FRACTURE SURGERY    . JOINT REPLACEMENT     right partial knee replacement    Allergies  Allergies  Allergen Reactions  . Sulfamethoxazole-Trimethoprim Nausea Only and Other (See Comments)  . Baclofen   . Nsaids     Avoids due to liver.    History of Present Illness    83 year old male with the above past medical history including coronary artery disease status post PCI and stenting of the LAD in May 2015.  He has a known subtotal occlusion of the right coronary artery and mild disease in the left circumflex. Other history includes hypertension, hyperlipidemia, somewhat chronic positional lightheadedness, multiple myeloma, GERD, and chronic back pain. He was last seen in clinic just about a year ago. Since then, he has done reasonably well from a cardiac standpoint though did require hospitalization in February with a right hip fracture requiring surgical correction. He has since recovered well from this.  He continues to be somewhat limited by chronic lower back pain which radiates down his legs. He does not experience chest pain or dyspnea. He is some degree of chronic lightheadedness that occurs with position changes. This may occur when lying down as well as when sitting back up. He says this is been present for about 2 and half years and isn't necessarily any worse. He denies palpitations, PND, orthopnea, syncope, edema, or early satiety.  Home Medications    Prior to Admission medications   Medication Sig Start Date End Date Taking? Authorizing Provider  acetaminophen (TYLENOL) 500 MG tablet Take 2 tablets (1,000 mg total) by mouth every 6 (six) hours. 04/17/16  Yes Dustin Flock, MD  aspirin EC 81 MG tablet Take 1 tablet (81 mg total) by mouth daily. 07/18/14  Yes Wellington Hampshire, MD  clonazePAM (KLONOPIN) 0.5 MG tablet Take 1 tablet (0.5 mg  total) by mouth 3 (three) times daily as needed for anxiety. 04/17/16  Yes Dustin Flock, MD  docusate sodium (COLACE) 100 MG capsule Take 1 capsule (100 mg total) by mouth 2 (two) times daily. 04/17/16  Yes Dustin Flock, MD  doxazosin (CARDURA) 1 MG tablet TAKE ONE TABLET BY MOUTH ONCE DAILY 04/08/16  Yes Roselee Nova, MD  isosorbide mononitrate (IMDUR) 30 MG 24 hr tablet Take 1 tablet (30 mg total) by mouth daily. 06/10/16  Yes Roselee Nova, MD  metoprolol tartrate (LOPRESSOR) 25 MG tablet Take 0.5 tablets (12.5 mg total) by mouth 2 (two) times  daily. 07/01/16  Yes Roselee Nova, MD  Multiple Vitamins-Minerals (PRESERVISION/LUTEIN PO) Take by mouth 2 (two) times daily.   Yes [provider]  nitroGLYCERIN (NITROSTAT) 0.4 MG SL tablet Place 0.4 mg under the tongue every 5 (five) minutes as needed for chest pain.   Yes [provider]  pantoprazole (PROTONIX) 40 MG tablet Take 1 tablet (40 mg total) by mouth daily. 06/10/16  Yes Roselee Nova, MD  polyethylene glycol [facility] / GLYCOLAX) packet Take 17 g by mouth daily.   Yes [provider]  pravastatin (PRAVACHOL) 40 MG tablet TAKE ONE TABLET BY MOUTH ONCE DAILY 04/08/16  Yes Roselee Nova, MD  sertraline (ZOLOFT) 100 MG tablet Take 1.5 tablets (150 mg total) by mouth at bedtime. 06/10/16  Yes Roselee Nova, MD    Review of Systems    Chronic low back pain as outlined above. also chronic lightheadedness with position changes. He denies chest pain, dyspnea, PND, orthopnea, syncope, edema, or early satiety..  All other systems reviewed and are otherwise negative except as noted above.  Physical Exam    VS:  BP 122/60 (BP Location: Left Arm, Patient Position: Sitting, Cuff Size: Normal)   Pulse 78   Ht '5\' 10"'$  (1.778 m)   Wt 187 lb 8 oz (85 kg)   BMI 26.90 kg/m  , BMI Body mass index is 26.9 kg/m.  Blood pressure lying 120/70, blood pressure sitting 130/68, blood pressure standing 120/70, blood pressure standing 3 minutes 138/70. He was mildly dizzy when he went from lying to sitting. GEN: Well nourished, well developed, in no acute distress.  HEENT: normal.  Neck: Supple, no JVD, carotid bruits, or masses. Cardiac: RRR, no murmurs, rubs, or gallops. No clubbing, cyanosis, edema.  Radials/DP/PT 2+ and equal bilaterally.  Respiratory:  Respirations regular and unlabored, clear to auscultation bilaterally. GI: Soft, nontender, nondistended, BS + x 4. MS: no deformity or atrophy. Skin: warm and dry, no rash. Neuro:  Strength and sensation are  intact. Psych: Normal affect.  Accessory Clinical Findings    ECG - Regular sinus rhythm, 78, first degree AV block, no acute ST or T changes.  Assessment & Plan    1.  Coronary artery disease: Status post prior PCI undergoing stent placement to the LAD in 2015. He is a known subtotal occlusion of the right coronary artery with left-to-right collaterals and mild nonobstructive left circumflex complex disease. He has been doing well without any chest pain or dyspnea. He remains on aspirin, beta blocker, nitrate, and statin therapy.  2. essential hypertension: Blood pressure stable today. He is chronic lightheadedness and his wife just noted that he has been taking metoprolol 25 mg twice a day but is actually prescribed 12.5 mg twice a day. She thinks this may have been a recent change. She has still been giving  him 25 twice a day. This may have contributed to his orthostatic symptoms, though he was not orthostatic by blood pressure today.  3. Hyperlipidemia: He is on Pravachol. LDL was 23 in January 2018.  4. Lightheadedness: This is somewhat chronic and has been going on for at least 2+ years. His metoprolol was recently reduced. He will start taking the reduced dose this evening.  5. Disposition: He will follow-up with Dr. Fletcher Anon in 6 months or sooner if necessary.   Murray Hodgkins, NP 07/07/2016, 5:14 PM

## 2016-07-07 NOTE — Patient Instructions (Signed)
Follow-Up: Your physician wants you to follow-up in: 6 months. You will receive a reminder letter in the mail two months in advance. If you don't receive a letter, please call our office to schedule the follow-up appointment.  It was a pleasure seeing you today here in the office. Please do not hesitate to give us a call back if you have any further questions. [phone]  Atara Paterson A. RN, BSN     

## 2016-07-08 ENCOUNTER — Encounter: Payer: Medicare Other | Admitting: Internal Medicine

## 2016-07-08 DIAGNOSIS — S41112D Laceration without foreign body of left upper arm, subsequent encounter: Secondary | ICD-10-CM | POA: Diagnosis not present

## 2016-07-09 ENCOUNTER — Ambulatory Visit (INDEPENDENT_AMBULATORY_CARE_PROVIDER_SITE_OTHER): Payer: Medicare Other | Admitting: Family Medicine

## 2016-07-09 ENCOUNTER — Encounter: Payer: Self-pay | Admitting: Family Medicine

## 2016-07-09 VITALS — BP 136/71 | HR 86 | Temp 98.1°F | Resp 16 | Ht 70.0 in | Wt 187.1 lb

## 2016-07-09 DIAGNOSIS — Z029 Encounter for administrative examinations, unspecified: Secondary | ICD-10-CM

## 2016-07-09 DIAGNOSIS — Z7409 Other reduced mobility: Secondary | ICD-10-CM

## 2016-07-09 DIAGNOSIS — I2510 Atherosclerotic heart disease of native coronary artery without angina pectoris: Secondary | ICD-10-CM | POA: Diagnosis not present

## 2016-07-09 DIAGNOSIS — Z789 Other specified health status: Secondary | ICD-10-CM

## 2016-07-09 NOTE — Progress Notes (Signed)
Name: [name]   MRN: [id number]    DOB: April 14, 1923   Date:07/09/2016       Progress Note  Subjective  Chief Complaint  Chief Complaint  Patient presents with  . Follow-up    paperwork    HPI  Pt. Presents for completion of paperwork for the [place] to assist with paying for an aide.  Patient is residing in [place] his wife.    Past Medical History:  Diagnosis Date  . Anxiety   . BPH (benign prostatic hypertrophy)   . Carotid stenosis    a. 05/2015 Carotid U/S: mild nonobs atherosclerosis. No need for f/u.  Marland Kitchen Chronic back pain   . Coronary artery disease    a. 06/2013 Cath/PCI: LAD 50-70p (FFR 0.82-->PCI w/ 3.5x24 Promus DES), RCA subtotally occluded w/ L->R collats, LCX 30ost.  . Dyslipidemia   . GERD (gastroesophageal reflux disease)   . Hx of [name]   . Hyperlipidemia   . Hypertension   . Inguinal hernia   . Lightheadedness    a. chronic, somewhat positional.  . 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 Major depression   . Multiple myeloma ([place])   . Pleural effusion    left  . Sleep apnea     Past Surgical History:  Procedure Laterality Date  . ANTERIOR APPROACH HEMI HIP ARTHROPLASTY Right 04/16/2016   Procedure: ANTERIOR APPROACH HEMI HIP ARTHROPLASTY;  Surgeon: Hessie Knows, MD;  Location: [facility] ORS;  Service: Orthopedics;  Laterality: Right;  . APPENDECTOMY    . CARDIAC CATHETERIZATION  2002  . CARDIAC CATHETERIZATION  06/2013  . COLONOSCOPY    . FEMUR FRACTURE SURGERY Left    with rod  . HIP FRACTURE SURGERY    . JOINT REPLACEMENT     right partial knee replacement    Family History  Problem Relation Age of Onset  . Heart disease Mother   . Stroke Mother   . Diabetes Father   . Dementia Father   . Stroke Sister   . Stroke Brother   . Stroke Sister   . Stroke Sister   . Stroke Brother   . Stroke Brother     Social History   Social History  . Marital status: Married    Spouse name: N/A  . Number  of children: N/A  . Years of education: N/A   Occupational History  . Not on file.   Social History Main Topics  . Smoking status: Former Smoker    Packs/day: 2.00    Years: 7.00    Types: Cigarettes    Quit date: 09/17/1953  . Smokeless tobacco: Never Used  . Alcohol use No  . Drug use: No  . Sexual activity: Not Currently   Other Topics Concern  . Not on file   Social History Narrative  . No narrative on file     Current Outpatient Prescriptions:  .  acetaminophen (TYLENOL) 500 MG tablet, Take 2 tablets (1,000 mg total) by mouth every 6 (six) hours., Disp: 30 tablet, Rfl: 0 .  aspirin EC 81 MG tablet, Take 1 tablet (81 mg total) by mouth daily., Disp: 90 tablet, Rfl: 3 .  clonazePAM (KLONOPIN) 0.5 MG tablet, Take 1 tablet (0.5 mg total) by mouth 3 (three) times daily as needed for anxiety., Disp: 90 tablet, Rfl: 2 .  docusate sodium (COLACE) 100 MG capsule, Take 1 capsule (100 mg total) by mouth 2 (two) times daily., Disp: 10 capsule, Rfl: 0 .  doxazosin (CARDURA) 1 MG tablet, TAKE ONE TABLET BY MOUTH ONCE DAILY, Disp: 90 tablet, Rfl: 0 .  isosorbide mononitrate (IMDUR) 30 MG 24 hr tablet, Take 1 tablet (30 mg total) by mouth daily., Disp: 90 tablet, Rfl: 1 .  metoprolol tartrate (LOPRESSOR) 25 MG tablet, Take 0.5 tablets (12.5 mg total) by mouth 2 (two) times daily., Disp: 180 tablet, Rfl: 0 .  Multiple Vitamins-Minerals (PRESERVISION/LUTEIN PO), Take by mouth 2 (two) times daily., Disp: , Rfl:  .  nitroGLYCERIN (NITROSTAT) 0.4 MG SL tablet, Place 0.4 mg under the tongue every 5 (five) minutes as needed for chest pain., Disp: , Rfl:  .  pantoprazole (PROTONIX) 40 MG tablet, Take 1 tablet (40 mg total) by mouth daily., Disp: 90 tablet, Rfl: 0 .  polyethylene glycol (MIRALAX / GLYCOLAX) packet, Take 17 g by mouth daily., Disp: , Rfl:  .  pravastatin (PRAVACHOL) 40 MG tablet, TAKE ONE TABLET BY MOUTH ONCE DAILY, Disp: 90 tablet, Rfl: 0 .  sertraline (ZOLOFT) 100 MG tablet, Take  1.5 tablets (150 mg total) by mouth at bedtime., Disp: 45 tablet, Rfl: 1  Allergies  Allergen Reactions  . Sulfamethoxazole-Trimethoprim Nausea Only and Other (See Comments)  . Baclofen   . Nsaids     Avoids due to liver.     ROS  Please see history of present illness.  Objective  Vitals:   07/09/16 0838  BP: 136/71  Pulse: 86  Resp: 16  Temp: 98.1 F (36.7 C)  TempSrc: Oral  SpO2: 93%  Weight: 187 lb 1.6 oz (84.9 kg)  Height: '5\' 10"'$  (1.778 m)    Physical Exam  Constitutional: He is oriented to person, place, and time and well-developed, well-nourished, and in no distress.  Musculoskeletal:       Back:  Neurological: He is alert and oriented to person, place, and time.  Psychiatric: Memory, affect and judgment normal. His mood appears anxious.  Nursing note and vitals reviewed.     Assessment & Plan  1. Impaired mobility and ADLs Difficulty with ambulation because of chronic low back pain, right hip fracture, deconditioning because of advanced age.  2. Encounter for administrative examinations Completed the Department [employer] form to obtain aide for assistance with some activities of daily living. Form completed and signed, copy for our records  Note total face-to-face time: 40 minutes, greater than 50% was spent in counseling and coordination of care with the patient. This included reviewing the form in its entirety, completing the required assessment and examination, fillingthe form with pertinent findings and providing the results to the patient.  Mattix Imhof As[facility] 07/09/2016 8:53 AM

## 2016-07-10 NOTE — Progress Notes (Signed)
Brandon, [name] ([id number]) Visit Report for 07/08/2016 Arrival Information Details Patient Name: [name], [name] Date of Service: 07/08/2016 10:15 AM Medical Record Number: [id number] Patient Account Number: [id number] Date of Birth/Sex: [birth year] (83 y.o. Male) Treating RN: Montey Hora Primary Care Yuritzi Kamp: [facility], [facility] Other Clinician: Referring Jahlisa Rossitto: [facility], [facility] Treating Christabel Camire/Extender: Tito Dine in Treatment: 5 Visit Information History Since Last Visit Added or deleted any medications: No Patient Arrived: [name] Any new allergies or adverse reactions: No Arrival Time: 09:54 Had a fall or experienced change in No Accompanied By: spouse activities of daily living that may affect Transfer Assistance: None risk of falls: Patient Identification Verified: Yes Signs or symptoms of abuse/neglect since last No Secondary Verification Process Completed: Yes visito Hospitalized since last visit: No Has Dressing in Place as Prescribed: Yes Pain Present Now: No Electronic Signature(s) Signed: 07/08/2016 5:24:32 PM By: Montey Hora Entered By: Montey Hora on 07/08/2016 09:54:30 [name], [name] ([id number]) -------------------------------------------------------------------------------- Clinic Level of Care Assessment Details Patient Name: [name], [name] Date of Service: 07/08/2016 10:15 AM Medical Record Number: [id number] Patient Account Number: [id number] Date of Birth/Sex: 02-28-1923 (83 y.o. Male) Treating RN: Montey Hora Primary Care Lawrance Wiedemann: [facility], [facility] Other Clinician: Referring Abdurahman Rugg: [facility], [facility] Treating Lyberti Thrush/Extender: Tito Dine in Treatment: 5 Clinic Level of Care Assessment Items TOOL 4 Quantity Score []  - Use when only an EandM is performed on FOLLOW-UP visit 0 ASSESSMENTS - Nursing Assessment / Reassessment X - Reassessment of Co-morbidities (includes updates in patient status) 1 10 X - Reassessment of Adherence to Treatment  Plan 1 5 ASSESSMENTS - Wound and Skin Assessment / Reassessment X - Simple Wound Assessment / Reassessment - one wound 1 5 []  - Complex Wound Assessment / Reassessment - multiple wounds 0 []  - Dermatologic / Skin Assessment (not related to wound area) 0 ASSESSMENTS - Focused Assessment []  - Circumferential Edema Measurements - multi extremities 0 []  - Nutritional Assessment / Counseling / Intervention 0 []  - Lower Extremity Assessment (monofilament, tuning fork, pulses) 0 []  - Peripheral Arterial Disease Assessment (using hand held doppler) 0 ASSESSMENTS - Ostomy and/or Continence Assessment and Care []  - Incontinence Assessment and Management 0 []  - Ostomy Care Assessment and Management (repouching, etc.) 0 PROCESS - Coordination of Care X - Simple Patient / Family Education for ongoing care 1 15 []  - Complex (extensive) Patient / Family Education for ongoing care 0 []  - Staff obtains Programmer, systems, Records, Test Results / Process Orders 0 []  - Staff telephones HHA, Nursing Homes / Clarify orders / etc 0 []  - Routine Transfer to another Facility (non-emergent condition) 0 [name], [name] ([id number]) []  - Routine Hospital Admission (non-emergent condition) 0 []  - New Admissions / Biomedical engineer / Ordering NPWT, Apligraf, etc. 0 []  - Emergency Hospital Admission (emergent condition) 0 X - Simple Discharge Coordination 1 10 []  - Complex (extensive) Discharge Coordination 0 PROCESS - Special Needs []  - Pediatric / Minor Patient Management 0 []  - Isolation Patient Management 0 []  - Hearing / Language / Visual special needs 0 []  - Assessment of Community assistance (transportation, D/C planning, etc.) 0 []  - Additional assistance / Altered mentation 0 []  - Support Surface(s) Assessment (bed, cushion, seat, etc.) 0 INTERVENTIONS - Wound Cleansing / Measurement X - Simple Wound Cleansing - one wound 1 5 []  - Complex Wound Cleansing - multiple wounds 0 X - Wound Imaging (photographs -  any number of wounds) 1 5 []  - Wound Tracing (instead of photographs) 0 X -  Simple Wound Measurement - one wound 1 5 []  - Complex Wound Measurement - multiple wounds 0 INTERVENTIONS - Wound Dressings []  - Small Wound Dressing one or multiple wounds 0 []  - Medium Wound Dressing one or multiple wounds 0 []  - Large Wound Dressing one or multiple wounds 0 []  - Application of Medications - topical 0 []  - Application of Medications - injection 0 INTERVENTIONS - Miscellaneous []  - External ear exam 0 [name], [name] ([id number]) []  - Specimen Collection (cultures, biopsies, blood, body fluids, etc.) 0 []  - Specimen(s) / Culture(s) sent or taken to Lab for analysis 0 []  - Patient Transfer (multiple staff / Harrel Lemon Lift / Similar devices) 0 []  - Simple Staple / Suture removal (25 or less) 0 []  - Complex Staple / Suture removal (26 or more) 0 []  - Hypo / Hyperglycemic Management (close monitor of Blood Glucose) 0 []  - Ankle / Brachial Index (ABI) - do not check if billed separately 0 X - Vital Signs 1 5 Has the patient been seen at the hospital within the last three years: Yes Total Score: 65 Level Of Care: New/Established - Level 2 Electronic Signature(s) Signed: 07/08/2016 5:24:32 PM By: Montey Hora Entered By: Montey Hora on 07/08/2016 10:05:05 [name], [name] ([id number]) -------------------------------------------------------------------------------- Encounter Discharge Information Details Patient Name: [name], [name] Date of Service: 07/08/2016 10:15 AM Medical Record Number: [id number] Patient Account Number: [id number] Date of Birth/Sex: Mar 10, 1923 (83 y.o. Male) Treating RN: Montey Hora Primary Care Daryn Pisani: [facility], [facility] Other Clinician: Referring Lawanda Holzheimer: [facility], [facility] Treating Orlinda Slomski/Extender: Tito Dine in Treatment: 5 Encounter Discharge Information Items Discharge Pain Level: 0 Discharge Condition: Stable Ambulatory Status: [name] Discharge Destination:  Home Transportation: Private Auto Accompanied By: spouse Schedule Follow-up Appointment: No Medication Reconciliation completed No and provided to Patient/Care Berk Pilot: Provided on Clinical Summary of Care: 07/08/2016 Form Type Recipient Paper Patient AS Electronic Signature(s) Signed: 07/08/2016 10:07:07 AM By: Ruthine Dose Entered By: Ruthine Dose on 07/08/2016 10:07:06 [place], Azarel L. ([id number]) -------------------------------------------------------------------------------- Multi Wound Chart Details Patient Name: [name], [name] Date of Service: 07/08/2016 10:15 AM Medical Record Number: [id number] Patient Account Number: [id number] Date of Birth/Sex: November 29, 1923 (83 y.o. Male) Treating RN: Montey Hora Primary Care Bryer Cozzolino: [facility], [facility] Other Clinician: Referring Takelia Urieta: [facility], [facility] Treating Pearly Apachito/Extender: Tito Dine in Treatment: 5 Vital Signs Height(in): 71 Pulse(bpm): 71 Weight(lbs): 183 Blood Pressure 115/63 (mmHg): Body Mass Index(BMI): 26 Temperature(F): 97.8 Respiratory Rate 16 (breaths/min): Photos: [8:No Photos] [N/A:N/A] Wound Location: [8:Left, Lateral Upper Arm] [N/A:N/A] Wounding Event: [8:Trauma] [N/A:N/A] Primary Etiology: [8:Trauma, Other] [N/A:N/A] Date Acquired: [8:05/19/2016] [N/A:N/A] Weeks of Treatment: [8:5] [N/A:N/A] Wound Status: [8:Open] [N/A:N/A] Clustered Wound: [8:Yes] [N/A:N/A] Measurements L x W x D 0x0x0 [N/A:N/A] (cm) Area (cm) : [8:0] [N/A:N/A] Volume (cm) : [8:0] [N/A:N/A] % Reduction in Area: [8:100.00%] [N/A:N/A] % Reduction in Volume: 100.00% [N/A:N/A] Classification: [8:Partial Thickness] [N/A:N/A] Periwound Skin Texture: No Abnormalities Noted [N/A:N/A] Periwound Skin [8:No Abnormalities Noted] [N/A:N/A] Moisture: Periwound Skin Color: No Abnormalities Noted [N/A:N/A] Tenderness on [8:No] [N/A:N/A] Treatment Notes Electronic Signature(s) Signed: 07/08/2016 5:34:29 PM By: Linton Ham  MD Entered By: Linton Ham on 07/08/2016 10:16:12 Syler, Kamaal L. ([id number]) Cohill, Alim L. ([id number]) -------------------------------------------------------------------------------- [place] Details Patient Name: [name], [name] Date of Service: 07/08/2016 10:15 AM Medical Record Number: [id number] Patient Account Number: [id number] Date of Birth/Sex: 07-10-1923 (83 y.o. Male) Treating RN: Montey Hora Primary Care Maygen Sirico: [facility], [place]ossie Der Other Clinician: Referring Miriam Kestler: [facility], [facility] Treating Callen Zuba/Extender: Tito Dine in Treatment: 5 Active Inactive  Electronic Signature(s) Signed: 07/08/2016 5:24:32 PM By: Montey Hora Entered By: Montey Hora on 07/08/2016 10:03:39 [name], [name] ([id number]) -------------------------------------------------------------------------------- Pain Assessment Details Patient Name: [name], [name] Date of Service: 07/08/2016 10:15 AM Medical Record Number: [id number] Patient Account Number: [id number] Date of Birth/Sex: 1923-04-26 (83 y.o. Male) Treating RN: Montey Hora Primary Care Omair Dettmer: [facility], [place]ie Der Other Clinician: Referring Aimee Timmons: [facility], [facility] Treating Lakethia Coppess/Extender: Tito Dine in Treatment: 5 Active Problems Location of Pain Severity and Description of Pain Patient Has Paino No Site Locations Pain Management and Medication Current Pain Management: Notes Topical or injectable lidocaine is offered to patient for acute pain when surgical debridement is performed. If needed, Patient is instructed to use over the counter pain medication for the following 24-48 hours after debridement. Wound care MDs do not prescribed pain medications. Patient has chronic pain or uncontrolled pain. Patient has been instructed to make an appointment with their Primary Care Physician for pain management. Electronic Signature(s) Signed: 07/08/2016 5:24:32 PM By: Montey Hora Entered By: Montey Hora  on 07/08/2016 09:54:38 [place], [place] ([id number]) -------------------------------------------------------------------------------- Patient/Caregiver Education Details Patient Name: [name], [name] Date of Service: 07/08/2016 10:15 AM Medical Record Number: [id number] Patient Account Number: [id number] Date of Birth/Gender: 1923-12-05 (83 y.o. Male) Treating RN: Montey Hora Primary Care Physician: [facility], [facility] Other Clinician: Referring Physician: [facility], [facility] Treating Physician/Extender: Tito Dine in Treatment: 5 Education Assessment Education Provided To: Patient and Caregiver Education Topics Provided Basic Hygiene: Handouts: Other: lotion skin Methods: Explain/Verbal Responses: State content correctly Electronic Signature(s) Signed: 07/08/2016 5:24:32 PM By: Montey Hora Entered By: Montey Hora on 07/08/2016 10:04:38 [name], [name] ([id number]) -------------------------------------------------------------------------------- Wound Assessment Details Patient Name: [name], [name] Date of Service: 07/08/2016 10:15 AM Medical Record Number: [id number] Patient Account Number: [id number] Date of Birth/Sex: 02/18/1923 (83 y.o. Male) Treating RN: Montey Hora Primary Care Kilo Eshelman: [facility], [facility] Other Clinician: Referring Cheron Coryell: [facility], [facility] Treating Kelyse Pask/Extender: Tito Dine in Treatment: 5 Wound Status Wound Number: [id number] Primary Etiology: Trauma, Other Wound Location: Left, Lateral Upper Arm Wound Status: Open Wounding Event: Trauma Date Acquired: 05/19/2016 Weeks Of Treatment: 5 Clustered Wound: Yes Photos Photo Uploaded By: Montey Hora on 07/08/2016 10:59:01 Wound Measurements Length: (cm) 0 % Reduction i Width: (cm) 0 % Reduction i Depth: (cm) 0 Area: (cm) 0 Volume: (cm) 0 n Area: 100% n Volume: 100% Wound Description Classification: Partial Thickness Periwound Skin Texture Texture Color No Abnormalities Noted: No No  Abnormalities Noted: No Moisture No Abnormalities Noted: No Electronic Signature(s) Signed: 07/08/2016 5:24:32 PM By: Melburn Popper, Sotirios LMarland Kitchen ([id number]) Entered By: Montey Hora on 07/08/2016 09:57:46 [name], Lige L. ([id number]) -------------------------------------------------------------------------------- Vitals Details Patient Name: [name], [name] Date of Service: 07/08/2016 10:15 AM Medical Record Number: [id number] Patient Account Number: [id number] Date of Birth/Sex: [birth year] (83 y.o. Male) Treating RN: Montey Hora Primary Care Roshell Brigham: [facility], [facility] Other Clinician: Referring Khalik Pewitt: [facility], [facility] Treating Vannah Nadal/Extender: Tito Dine in Treatment: 5 Vital Signs Time Taken: 09:54 Temperature (F): 97.8 Height (in): 71 Pulse (bpm): 71 Weight (lbs): 183 Respiratory Rate (breaths/min): 16 Body Mass Index (BMI): 25.5 Blood Pressure (mmHg): 115/63 Reference Range: 80 - 120 mg / dl Electronic Signature(s) Signed: 07/08/2016 5:24:32 PM By: Montey Hora Entered By: Montey Hora on 07/08/2016 09:57:03

## 2016-07-10 NOTE — Progress Notes (Addendum)
Brandon, [name] ([id number]) Visit Report for 07/08/2016 HPI Details Patient Name: [name], [name] Date of Service: 07/08/2016 10:15 AM Medical Record Number: [id number] Patient Account Number: [id number] Date of Birth/Sex: November 15, 1923 (83 y.o. Male) Treating RN: Montey Hora Primary Care Provider: [facility], Dossie Der Other Clinician: Referring Provider: [facility], [facility] Treating Provider/Extender: Tito Dine in Treatment: 5 History of Present Illness Location: right elbow, left knee, left upper arm and left upper back Quality: Patient reports experiencing a dull pain to affected area(s). Severity: Patient states wound (s) are getting better. Duration: Patient has had the wound for < 1 weeks prior to presenting for treatment Timing: Pain in wound is Intermittent (comes and goes Context: The wound occurred when the patient had a fall and hurt his back Modifying Factors: Other treatment(s) tried include:local care with Neosporin ointment Associated Signs and Symptoms: Patient reports having:scar tissue opens out every time they do dressing HPI Description: 83 year old patient who was in [place] in May a couple of times for a lacerated wound which was healed over a couple of weeks. He now comes with recent injuries to his back and knees and both arms sustained after a fall. He did not seek any medical help and they use dressing material which was previously there. Past medical history significant for carotid stenosis, [name], [name], dyslipidemia, hypertension, anxiety, GERD, multiple myeloma, BPH, inguinal hernia and is status post appendectomy, hip fracture, cardiac catheterization. He smoked in the remote past and has not smoked for several years. He has had long-term low back pain and was evaluated for pain radiating to the left groin. 02-06-16 [name] progress for evaluation of multiple skin tears, he is accompanied by his wife. Neither patient nor his wife expressed the concerns  regarding his wounds and/or the treatment plan. They deny any falls since his last appointment or any new injuries. READMISSION this is a 83 year old man who is been seen in the clinic in 2017 for multiple skin tears after falling. These were fairly easy to close over unfortunately the patient's recent course is not gone well. He fell and suffered a right hip fracture at the beginning of March. He was discharged to a skilled facility and sometime during the stay at the skilled facility his daughter noted a bandage on his left elbow. It wasn't told later that they learned he had fallen out of bed sustaining an injury. After discharge a week ago he apparently had a off- balance issue falling against the wall and hit the left elbow again. Been using peroxide and Neosporin. Seen by the family doctor yesterday and x-ray of the left elbow was negative. He was put on doxycycline. The patient has a history of coronary artery disease status post stent, [name], hypertension, lumbar spinal stenosis, multiple myeloma and BPH. He is apparently had bilateral hip replacements secondary to falling with the most recent one being on the right. He states that on the times he has fallen he has got up to go to the bathroom and becomes "swimmy headed" although he does not describe the off-balance [place], [name] ([id number]) sensation were reproducibly with changes in position 06/10/16; left elbow wounds look improved this is on the posterior aspect over the olecranon area. Healthy- looking granulation. The Prisma apparently stuck to the wound bed at least as being applied by home health. We changed to [facility]. 06/17/16; the areas over the left elbow look improved. There are 2 small open areas. Light surface debridement to remove surface eschar. 06/24/16;  left elbow as predicted last week has epithelialized as predicted HOWEVER today he arrives with blisters in a perfect distribution limited to the  wound. 07/01/16; the entire area of his elbow looks better this week. Only one open area remains 07/08/16; the open areas around his left elbow look better. This is a frail man with a history of falls. He has multiple myeloma by history and lumbar spinal stenosis. He uses a walker. He has actinic keratosis, actinic purpura and xerotic skin. He is severely damaged skin on his forearms Electronic Signature(s) Signed: 07/08/2016 5:34:29 PM By: Linton Ham MD Entered By: Linton Ham on 07/08/2016 10:17:56 [place], Usbaldo L. ([id number]) -------------------------------------------------------------------------------- Physical Exam Details Patient Name: [name], [name] Date of Service: 07/08/2016 10:15 AM Medical Record Number: [id number] Patient Account Number: [id number] Date of Birth/Sex: June 06, 1923 (83 y.o. Male) Treating RN: Montey Hora Primary Care Provider: [facility], Dossie Der Other Clinician: Referring Provider: [facility], [facility] Treating Provider/Extender: Tito Dine in Treatment: 5 Constitutional Sitting or standing Blood Pressure is within target range for patient.. Pulse regular and within target range for patient.Marland Kitchen Respirations regular, non-labored and within target range.. Temperature is normal and within the target range for the patient.Marland Kitchen appears in no distress. Notes Wound exam; the patient has no open areas surrounding the problematic left elbow area/olecranon area. The skin on his bilateral forearms is severely damaged mostly sun-related damage including actinic keratosis actinic purpura and severely dry skin. I've advised him to keep this lotioned he has no open area. Electronic Signature(s) Signed: 07/08/2016 5:34:29 PM By: Linton Ham MD Entered By: Linton Ham on 07/08/2016 10:19:17 [place], [name]. ([id number]) -------------------------------------------------------------------------------- Physician Orders Details Patient Name: [name], [name] Date of Service:  07/08/2016 10:15 AM Medical Record Number: [id number] Patient Account Number: [id number] Date of Birth/Sex: 09-07-1923 (82 y.o. Male) Treating RN: Montey Hora Primary Care Provider: [facility], [facility] Other Clinician: Referring Provider: [facility], [facility] Treating Provider/Extender: Tito Dine in Treatment: 5 Verbal / Phone Orders: No Diagnosis [place] - Patient has been discharged from [place]. Please discontinue wound care orders and discharge patient from [facility] services unless he is being seen for something other than wound care. Thank you Discharge From [facility] Services o Discharge from [place] Signature(s) Signed: 07/08/2016 10:37:52 AM By: Montey Hora Signed: 07/08/2016 5:34:29 PM By: Linton Ham MD Entered By: Montey Hora on 07/08/2016 10:37:51 [place], [place] ([id number]) -------------------------------------------------------------------------------- Problem List Details Patient Name: [name], [name] Date of Service: 07/08/2016 10:15 AM Medical Record Number: [id number] Patient Account Number: [id number] Date of Birth/Sex: 06/19/1923 (83 y.o. Male) Treating RN: Montey Hora Primary Care Provider: [facility], Dossie Der Other Clinician: Referring Provider: [facility], [facility] Treating Provider/Extender: Tito Dine in Treatment: 5 Active Problems ICD-10 Encounter Code Description Active Date Diagnosis S41.112D Laceration without foreign body of left upper arm, 06/02/2016 Yes subsequent encounter S41.111D Laceration without foreign body of right upper arm, 06/02/2016 Yes subsequent encounter I95.2 Hypotension due to drugs 06/02/2016 Yes Inactive Problems Resolved Problems Electronic Signature(s) Signed: 07/08/2016 5:34:29 PM By: Linton Ham MD Entered By: Linton Ham on 07/08/2016 10:15:53 Levings, Jaxden L.  ([id number]) -------------------------------------------------------------------------------- Progress Note Details Patient Name: [name], [name] Date of Service: 07/08/2016 10:15 AM Medical Record Number: [id number] Patient Account Number: [id number] Date of Birth/Sex: 1923-03-04 (83 y.o. Male) Treating RN: Montey Hora Primary Care Provider: [facility], Dossie Der Other Clinician: Referring Provider: [facility], [facility] Treating Provider/Extender: Ricard Dillon Weeks in Treatment: 5 Subjective History of  Present Illness (HPI) The following HPI elements were documented for the patient's wound: Location: right elbow, left knee, left upper arm and left upper back Quality: Patient reports experiencing a dull pain to affected area(s). Severity: Patient states wound (s) are getting better. Duration: Patient has had the wound for < 1 weeks prior to presenting for treatment Timing: Pain in wound is Intermittent (comes and goes Context: The wound occurred when the patient had a fall and hurt his back Modifying Factors: Other treatment(s) tried include:local care with Neosporin ointment Associated Signs and Symptoms: Patient reports having:scar tissue opens out every time they do dressing 83 year old patient who was in [place] in May a couple of times for a lacerated wound which was healed over a couple of weeks. He now comes with recent injuries to his back and knees and both arms sustained after a fall. He did not seek any medical help and they use dressing material which was previously there. Past medical history significant for carotid stenosis, [name], [name], dyslipidemia, hypertension, anxiety, GERD, multiple myeloma, BPH, inguinal hernia and is status post appendectomy, hip fracture, cardiac catheterization. He smoked in the remote past and has not smoked for several years. He has had long-term low back pain and was evaluated for pain radiating to the left groin. 02-06-16 Mr. [name] progress for evaluation  of multiple skin tears, he is accompanied by his wife. Neither patient nor his wife expressed the concerns regarding his wounds and/or the treatment plan. They deny any falls since his last appointment or any new injuries. READMISSION this is a 83 year old man who is been seen in the clinic in 2017 for multiple skin tears after falling. These were fairly easy to close over unfortunately the patient's recent course is not gone well. He fell and suffered a right hip fracture at the beginning of March. He was discharged to a skilled facility and sometime during the stay at the skilled facility his daughter noted a bandage on his left elbow. It wasn't told later that they learned he had fallen out of bed sustaining an injury. After discharge a week ago he apparently had a off- balance issue falling against the wall and hit the left elbow again. Been using peroxide and Neosporin. Seen by the family doctor yesterday and x-ray of the left elbow was negative. He was put on doxycycline. The patient has a history of coronary artery disease status post stent, [name], hypertension, lumbar spinal stenosis, multiple myeloma and BPH. He is apparently had bilateral hip replacements secondary to falling with the most recent one being on the right. He states that on the times he has fallen he has got up to go to the bathroom and becomes "swimmy headed" although he does not describe the off-balance sensation were reproducibly with changes in position [name], [name] ([id number]) 06/10/16; left elbow wounds look improved this is on the posterior aspect over the olecranon area. Healthy- looking granulation. The Prisma apparently stuck to the wound bed at least as being applied by home health. We changed to [facility]. 06/17/16; the areas over the left elbow look improved. There are 2 small open areas. Light surface debridement to remove surface eschar. 06/24/16; left elbow as predicted last week has  epithelialized as predicted HOWEVER today he arrives with blisters in a perfect distribution limited to the wound. 07/01/16; the entire area of his elbow looks better this week. Only one open area remains 07/08/16; the open areas around his left elbow look better. This is  a frail man with a history of falls. He has multiple myeloma by history and lumbar spinal stenosis. He uses a walker. He has actinic keratosis, actinic purpura and xerotic skin. He is severely damaged skin on his forearms Objective Constitutional Sitting or standing Blood Pressure is within target range for patient.. Pulse regular and within target range for patient.Marland Kitchen Respirations regular, non-labored and within target range.. Temperature is normal and within the target range for the patient.Marland Kitchen appears in no distress. Vitals Time Taken: 9:54 AM, Height: 71 in, Weight: 183 lbs, BMI: 25.5, Temperature: 97.8 F, Pulse: 71 bpm, Respiratory Rate: 16 breaths/min, Blood Pressure: 115/63 mmHg. General Notes: Wound exam; the patient has no open areas surrounding the problematic left elbow area/olecranon area. The skin on his bilateral forearms is severely damaged mostly sun-related damage including actinic keratosis actinic purpura and severely dry skin. I've advised him to keep this lotioned he has no open area. Integumentary (Hair, Skin) Wound #8 status is Open. Original cause of wound was Trauma. The wound is located on the Left,Lateral Upper Arm. The wound measures 0cm length x 0cm width x 0cm depth; 0cm^2 area and 0cm^3 volume. Assessment Active Problems ICD-10 S41.112D - Laceration without foreign body of left upper arm, subsequent encounter S41.111D - Laceration without foreign body of right upper arm, subsequent encounter I95.2 - Hypotension due to drugs [name], [name] ([id number]) Plan Home Health: D/C [place] - Patient has been discharged from [place]. Please discontinue wound care orders  and discharge patient from [facility] services unless he is being seen for something other than wound care. Thank you Discharge From [facility] Services: Discharge from [place] #1 I think the patient can be discharged from our clinic #2 I've advised continue with lotion eating of the skin of both arms #3 if the left olecranon area becomes problematic then an elbow protector might be in order however most of the wounds he has appear related to falls/trauma Electronic Signature(s) Signed: 07/17/2016 10:32:33 AM By: Gretta Cool, BSN, RN, CWS, Kim RN, BSN Signed: 07/24/2016 5:56:02 PM By: Linton Ham MD Previous Signature: 07/08/2016 5:34:29 PM Version By: Linton Ham MD Entered By: Gretta Cool, BSN, RN, CWS, Kim on 07/17/2016 10:32:33 [place], [place] ([id number]) -------------------------------------------------------------------------------- SuperBill Details Patient Name: [name], [name] Date of Service: 07/08/2016 Medical Record Number: [id number] Patient Account Number: [id number] Date of Birth/Sex: 07/13/1923 (83 y.o. Male) Treating RN: Montey Hora Primary Care Provider: [facility], Dossie Der Other Clinician: Referring Provider: [facility], [facility] Treating Provider/Extender: Tito Dine in Treatment: 5 Diagnosis Coding ICD-10 Codes Code Description S41.112D Laceration without foreign body of left upper arm, subsequent encounter S41.111D Laceration without foreign body of right upper arm, subsequent encounter I95.2 Hypotension due to drugs Facility Procedures CPT4 Code: [id number] Description: [phone] - WOUND CARE VISIT-LEV 2 EST PT Modifier: Quantity: 1 Physician Procedures CPT4 Code Description: [id number] - WC PHYS LEVEL 2 - EST PT ICD-10 Description Diagnosis S41.112D Laceration without foreign body of left upper arm S41.111D Laceration without foreign body of right upper ar Modifier: , subsequent e m, subsequent Quantity: 1 ncounter encounter Electronic Signature(s) Signed: 07/08/2016  5:34:29 PM By: Linton Ham MD Entered By: Linton Ham on 07/08/2016 10:21:09

## 2016-07-22 ENCOUNTER — Other Ambulatory Visit: Payer: Self-pay | Admitting: Family Medicine

## 2016-07-22 DIAGNOSIS — F411 Generalized anxiety disorder: Secondary | ICD-10-CM

## 2016-07-22 NOTE — Telephone Encounter (Signed)
Requesting refill on clonazepam

## 2016-07-24 ENCOUNTER — Telehealth: Payer: Self-pay | Admitting: Family Medicine

## 2016-07-24 NOTE — Telephone Encounter (Signed)
Pt wife says that [name] needs his clonazepam. Says that the meds for [name] she did not get refilled and that the patient needs this by the first of the week.

## 2016-07-28 ENCOUNTER — Other Ambulatory Visit: Payer: Self-pay | Admitting: Emergency Medicine

## 2016-07-28 DIAGNOSIS — F411 Generalized anxiety disorder: Secondary | ICD-10-CM

## 2016-07-28 MED ORDER — CLONAZEPAM 0.5 MG PO TABS: 0.5000 mg | ORAL_TABLET | Freq: Three times a day (TID) | ORAL | 2 refills | Status: DC | PRN

## 2016-07-28 NOTE — Telephone Encounter (Signed)
Script ready for pick up 

## 2016-07-31 ENCOUNTER — Other Ambulatory Visit: Payer: Self-pay | Admitting: Family Medicine

## 2016-07-31 DIAGNOSIS — I10 Essential (primary) hypertension: Secondary | ICD-10-CM

## 2016-08-04 ENCOUNTER — Other Ambulatory Visit: Payer: Self-pay | Admitting: Family Medicine

## 2016-08-04 ENCOUNTER — Telehealth: Payer: Self-pay | Admitting: Family Medicine

## 2016-08-04 DIAGNOSIS — I10 Essential (primary) hypertension: Secondary | ICD-10-CM

## 2016-08-04 NOTE — Telephone Encounter (Signed)
After hours call received from nurse Patient has requested lisinopril, out It appears that was denied, taken off of his medicine list I recommended patient talk to primary about this No refill approved (5:09 pm)

## 2016-08-04 NOTE — Telephone Encounter (Signed)
Spoke with patient's wife [name] and explained that when he was admitted in February with a right femoral neck fracture, he was taken off of lisinopril because of renal insufficiency and he should not be taking it. Advised to discontinue lisinopril and check his blood pressure regularly and will review logs in one week. Patient's wife verbalized agreement and will call in the morning to make appointment

## 2016-08-06 ENCOUNTER — Telehealth: Payer: Self-pay | Admitting: Family Medicine

## 2016-08-06 NOTE — Telephone Encounter (Signed)
Pt was told to contact provider if blood pressure reached over 140/90. Last night it was 148/62 at 10:30pm and this morning it was 147/77. [phone]

## 2016-08-06 NOTE — Telephone Encounter (Signed)
Reached out to patient and he will be seen in clinic today 08/06/2016

## 2016-08-10 ENCOUNTER — Ambulatory Visit (INDEPENDENT_AMBULATORY_CARE_PROVIDER_SITE_OTHER): Payer: Medicare Other | Admitting: Family Medicine

## 2016-08-10 ENCOUNTER — Encounter: Payer: Self-pay | Admitting: Family Medicine

## 2016-08-10 VITALS — BP 116/58 | HR 80 | Temp 98.4°F | Resp 16 | Wt 191.1 lb

## 2016-08-10 DIAGNOSIS — I2510 Atherosclerotic heart disease of native coronary artery without angina pectoris: Secondary | ICD-10-CM

## 2016-08-10 DIAGNOSIS — R739 Hyperglycemia, unspecified: Secondary | ICD-10-CM | POA: Diagnosis not present

## 2016-08-10 DIAGNOSIS — I10 Essential (primary) hypertension: Secondary | ICD-10-CM | POA: Diagnosis not present

## 2016-08-10 DIAGNOSIS — E7800 Pure hypercholesterolemia, unspecified: Secondary | ICD-10-CM | POA: Diagnosis not present

## 2016-08-10 DIAGNOSIS — F3342 Major depressive disorder, recurrent, in full remission: Secondary | ICD-10-CM | POA: Diagnosis not present

## 2016-08-10 LAB — POCT GLYCOSYLATED HEMOGLOBIN (HGB A1C): Hemoglobin A1C: 6

## 2016-08-10 MED ORDER — SERTRALINE HCL 100 MG PO TABS: 150.0000 mg | ORAL_TABLET | Freq: Every day | ORAL | 1 refills | Status: DC

## 2016-08-10 MED ORDER — PRAVASTATIN SODIUM 40 MG PO TABS: 40.0000 mg | ORAL_TABLET | Freq: Every day | ORAL | 0 refills | Status: DC

## 2016-08-10 NOTE — Progress Notes (Signed)
Name: [name]   MRN: [id number]    DOB: [birth year]   Date:08/10/2016       Progress Note  Subjective  Chief Complaint  Chief Complaint  Patient presents with  . Follow-up    blood pressure    Hypertension  This is a chronic problem. The problem is unchanged. The problem is controlled. Pertinent negatives include no blurred vision, chest pain, headaches, palpitations or shortness of breath. Past treatments include beta blockers. Hypertensive end-organ damage includes [name]/MI. There is no history of kidney disease or CVA.  Hyperlipidemia  This is a chronic problem. The problem is uncontrolled. Recent lipid tests were reviewed and are low (HDL abnormally low). Pertinent negatives include no chest pain, leg pain, myalgias or shortness of breath. Current antihyperlipidemic treatment includes statins.  Depression         This is a chronic problem.  The onset quality is gradual. The problem is unchanged.  Associated symptoms include fatigue, hopelessness, decreased interest and sad.  Associated symptoms include no myalgias, no headaches and no suicidal ideas.  Past treatments include SSRIs - Selective serotonin reuptake inhibitors.  Compliance with treatment is good.    Past Medical History:  Diagnosis Date  . Anxiety   . BPH (benign prostatic hypertrophy)   . Carotid stenosis    a. 05/2015 Carotid U/S: mild nonobs atherosclerosis. No need for f/u.  Marland Kitchen Chronic back pain   . Coronary artery disease    a. 06/2013 Cath/PCI: LAD 50-70p (FFR 0.82-->PCI w/ 3.5x24 Promus DES), RCA subtotally occluded w/ L->R collats, LCX 30ost.  . Dyslipidemia   . GERD (gastroesophageal reflux disease)   . Hx of [name]   . Hyperlipidemia   . Hypertension   . Inguinal hernia   . Lightheadedness    a. chronic, somewhat positional.  . 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 Major depression   . Multiple myeloma ([place])   . Pleural effusion    left  . Sleep apnea     Past Surgical History:  Procedure  Laterality Date  . ANTERIOR APPROACH HEMI HIP ARTHROPLASTY Right 04/16/2016   Procedure: ANTERIOR APPROACH HEMI HIP ARTHROPLASTY;  Surgeon: Hessie Knows, MD;  Location: [facility] ORS;  Service: Orthopedics;  Laterality: Right;  . APPENDECTOMY    . CARDIAC CATHETERIZATION  2002  . CARDIAC CATHETERIZATION  06/2013  . COLONOSCOPY    . FEMUR FRACTURE SURGERY Left    with rod  . HIP FRACTURE SURGERY    . JOINT REPLACEMENT     right partial knee replacement    Family History  Problem Relation Age of Onset  . Heart disease Mother   . Stroke Mother   . Diabetes Father   . Dementia Father   . Stroke Sister   . Stroke Brother   . Stroke Sister   . Stroke Sister   . Stroke Brother   . Stroke Brother     Social History   Social History  . Marital status: Married    Spouse name: N/A  . Number of children: N/A  . Years of education: N/A   Occupational History  . Not on file.   Social History Main Topics  . Smoking status: Former Smoker    Packs/day: 2.00    Years: 7.00    Types: Cigarettes    Quit date: 09/17/1953  . Smokeless tobacco: Never Used  . Alcohol use No  . Drug use: No  . Sexual activity: Not Currently   Other Topics Concern  .  Not on file   Social History Narrative  . No narrative on file     Current Outpatient Prescriptions:  .  traMADol (ULTRAM) 50 MG tablet, Take by mouth., Disp: , Rfl:  .  acetaminophen (TYLENOL) 500 MG tablet, Take 2 tablets (1,000 mg total) by mouth every 6 (six) hours., Disp: 30 tablet, Rfl: 0 .  clonazePAM (KLONOPIN) 0.5 MG tablet, Take 1 tablet (0.5 mg total) by mouth 3 (three) times daily as needed for anxiety., Disp: 90 tablet, Rfl: 2 .  docusate sodium (COLACE) 100 MG capsule, Take 1 capsule (100 mg total) by mouth 2 (two) times daily., Disp: 10 capsule, Rfl: 0 .  doxazosin (CARDURA) 1 MG tablet, TAKE ONE TABLET BY MOUTH ONCE DAILY, Disp: 90 tablet, Rfl: 0 .  isosorbide mononitrate (IMDUR) 30 MG 24 hr tablet, Take 1 tablet (30 mg total)  by mouth daily., Disp: 90 tablet, Rfl: 1 .  metoprolol tartrate (LOPRESSOR) 25 MG tablet, Take 0.5 tablets (12.5 mg total) by mouth 2 (two) times daily., Disp: 180 tablet, Rfl: 0 .  Multiple Vitamins-Minerals (PRESERVISION/LUTEIN PO), Take by mouth 2 (two) times daily., Disp: , Rfl:  .  nitroGLYCERIN (NITROSTAT) 0.4 MG SL tablet, Place 0.4 mg under the tongue every 5 (five) minutes as needed for chest pain., Disp: , Rfl:  .  pantoprazole (PROTONIX) 40 MG tablet, Take 1 tablet (40 mg total) by mouth daily., Disp: 90 tablet, Rfl: 0 .  polyethylene glycol (MIRALAX / GLYCOLAX) packet, Take 17 g by mouth daily., Disp: , Rfl:  .  pravastatin (PRAVACHOL) 40 MG tablet, TAKE ONE TABLET BY MOUTH ONCE DAILY, Disp: 90 tablet, Rfl: 0 .  sertraline (ZOLOFT) 100 MG tablet, Take 1.5 tablets (150 mg total) by mouth at bedtime., Disp: 45 tablet, Rfl: 1  Allergies  Allergen Reactions  . Sulfamethoxazole-Trimethoprim Nausea Only and Other (See Comments)  . Baclofen   . Nsaids     Avoids due to liver.  . Tolmetin Other (See Comments)    Avoids due to liver.     Review of Systems  Constitutional: Positive for fatigue.  Eyes: Negative for blurred vision.  Respiratory: Negative for shortness of breath.   Cardiovascular: Negative for chest pain and palpitations.  Musculoskeletal: Negative for myalgias.  Neurological: Negative for headaches.  Psychiatric/Behavioral: Positive for depression. Negative for suicidal ideas.      Objective  Vitals:   08/10/16 1024  BP: (!) 116/58  Pulse: 80  Resp: 16  Temp: 98.4 F (36.9 C)  TempSrc: Oral  SpO2: 91%  Weight: 191 lb 1.6 oz (86.7 kg)    Physical Exam  Constitutional: He is oriented to person, place, and time and well-developed, well-nourished, and in no distress.  HENT:  Head: Normocephalic and atraumatic.  Cardiovascular: Normal rate, regular rhythm and normal heart sounds.   No murmur heard. Pulmonary/Chest: Effort normal and breath sounds  normal. He has no wheezes.  Abdominal: Soft. Bowel sounds are normal. There is no tenderness.  Neurological: He is alert and oriented to person, place, and time.  Psychiatric: Mood, memory, affect and judgment normal.  Nursing note and vitals reviewed.    Assessment & Plan  1. Hyperglycemia   A1c 6.0%, considered prediabetes - POCT HgB A1C  2. Recurrent major depressive disorder, in full remission ([place])  - sertraline (ZOLOFT) 100 MG tablet; Take 1.5 tablets (150 mg total) by mouth at bedtime.  Dispense: 45 tablet; Refill: 1  3. Hypercholesteremia  - pravastatin (PRAVACHOL) 40 MG tablet; Take 1  tablet (40 mg total) by mouth daily.  Dispense: 90 tablet; Refill: 0 - Lipid panel  4. Benign hypertension BP stable on present antihypertensive therapy, he is no longer taking lisinopril, continue on metoprolol   Erikson Danzy [facility] 08/10/2016 10:42 AM

## 2016-08-11 ENCOUNTER — Ambulatory Visit: Payer: Medicare Other | Admitting: Family Medicine

## 2016-08-25 ENCOUNTER — Telehealth: Payer: Self-pay | Admitting: Family Medicine

## 2016-08-25 DIAGNOSIS — N400 Enlarged prostate without lower urinary tract symptoms: Secondary | ICD-10-CM

## 2016-08-25 NOTE — Telephone Encounter (Signed)
[name] (wife) requesting refill on Doxazosin 1 mg. Please send to [facility]

## 2016-08-26 MED ORDER — DOXAZOSIN MESYLATE 1 MG PO TABS: 1.0000 mg | ORAL_TABLET | Freq: Every day | ORAL | 0 refills | Status: DC

## 2016-08-26 NOTE — Telephone Encounter (Signed)
Medication has been refilled and sent to [facility] [address] 

## 2016-08-31 ENCOUNTER — Encounter: Payer: Self-pay | Admitting: Family Medicine

## 2016-08-31 ENCOUNTER — Ambulatory Visit (INDEPENDENT_AMBULATORY_CARE_PROVIDER_SITE_OTHER): Payer: Medicare Other | Admitting: Family Medicine

## 2016-08-31 VITALS — BP 142/58 | HR 78 | Temp 98.2°F | Resp 16 | Ht 70.0 in | Wt 194.9 lb

## 2016-08-31 DIAGNOSIS — K219 Gastro-esophageal reflux disease without esophagitis: Secondary | ICD-10-CM

## 2016-08-31 DIAGNOSIS — I10 Essential (primary) hypertension: Secondary | ICD-10-CM | POA: Diagnosis not present

## 2016-08-31 DIAGNOSIS — I2510 Atherosclerotic heart disease of native coronary artery without angina pectoris: Secondary | ICD-10-CM

## 2016-08-31 MED ORDER — METOPROLOL TARTRATE 25 MG PO TABS: ORAL_TABLET | ORAL | 0 refills | Status: DC

## 2016-08-31 MED ORDER — PANTOPRAZOLE SODIUM 40 MG PO TBEC: 40.0000 mg | DELAYED_RELEASE_TABLET | Freq: Every day | ORAL | 0 refills | Status: DC

## 2016-08-31 NOTE — Progress Notes (Signed)
Name: [name]   MRN: [id number]    DOB: 1923-04-13   Date:08/31/2016       Progress Note  Subjective  Chief Complaint  Chief Complaint  Patient presents with  . Follow-up  . Hypertension    Has been elevated so had to increase BP medication to 2 daily due to BP has been running 176/72 to 162/63    Hypertension  This is a chronic problem. The problem is unchanged. The problem is controlled. Pertinent negatives include no blurred vision, chest pain, headaches, palpitations or shortness of breath. Past treatments include beta blockers (now taking Metoprolol 50 mg BID, BP logs reviewed. ). Hypertensive end-organ damage includes [name]/MI. There is no history of kidney disease or CVA.  Gastroesophageal Reflux  He reports no abdominal pain, no belching, no chest pain, no coughing, no heartburn, no nausea or no sore throat. This is a chronic problem. The problem has been unchanged. He has tried a PPI for the symptoms. Past procedures do not include an EGD.     Past Medical History:  Diagnosis Date  . Anxiety   . BPH (benign prostatic hypertrophy)   . Carotid stenosis    a. 05/2015 Carotid U/S: mild nonobs atherosclerosis. No need for f/u.  Marland Kitchen Chronic back pain   . Coronary artery disease    a. 06/2013 Cath/PCI: LAD 50-70p (FFR 0.82-->PCI w/ 3.5x24 Promus DES), RCA subtotally occluded w/ L->R collats, LCX 30ost.  . Dyslipidemia   . GERD (gastroesophageal reflux disease)   . Hx of [name]   . Hyperlipidemia   . Hypertension   . Inguinal hernia   . Lightheadedness    a. chronic, somewhat positional.  . 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 Major depression   . Multiple myeloma ([place])   . Pleural effusion    left  . Sleep apnea     Past Surgical History:  Procedure Laterality Date  . ANTERIOR APPROACH HEMI HIP ARTHROPLASTY Right 04/16/2016   Procedure: ANTERIOR APPROACH HEMI HIP ARTHROPLASTY;  Surgeon: Hessie Knows, MD;  Location: [facility] ORS;  Service: Orthopedics;  Laterality: Right;   . APPENDECTOMY    . CARDIAC CATHETERIZATION  2002  . CARDIAC CATHETERIZATION  06/2013  . COLONOSCOPY    . FEMUR FRACTURE SURGERY Left    with rod  . HIP FRACTURE SURGERY    . JOINT REPLACEMENT     right partial knee replacement    Family History  Problem Relation Age of Onset  . Heart disease Mother   . Stroke Mother   . Diabetes Father   . Dementia Father   . Stroke Sister   . Stroke Brother   . Stroke Sister   . Stroke Sister   . Stroke Brother   . Stroke Brother     Social History   Social History  . Marital status: Married    Spouse name: N/A  . Number of children: N/A  . Years of education: N/A   Occupational History  . Not on file.   Social History Main Topics  . Smoking status: Former Smoker    Packs/day: 2.00    Years: 7.00    Types: Cigarettes    Quit date: 09/17/1953  . Smokeless tobacco: Never Used  . Alcohol use No  . Drug use: No  . Sexual activity: Not Currently   Other Topics Concern  . Not on file   Social History Narrative  . No narrative on file     Current Outpatient Prescriptions:  .  acetaminophen (TYLENOL) 500 MG tablet, Take 2 tablets (1,000 mg total) by mouth every 6 (six) hours., Disp: 30 tablet, Rfl: 0 .  clonazePAM (KLONOPIN) 0.5 MG tablet, Take 1 tablet (0.5 mg total) by mouth 3 (three) times daily as needed for anxiety., Disp: 90 tablet, Rfl: 2 .  docusate sodium (COLACE) 100 MG capsule, Take 1 capsule (100 mg total) by mouth 2 (two) times daily., Disp: 10 capsule, Rfl: 0 .  doxazosin (CARDURA) 1 MG tablet, Take 1 tablet (1 mg total) by mouth daily., Disp: 90 tablet, Rfl: 0 .  isosorbide mononitrate (IMDUR) 30 MG 24 hr tablet, Take 1 tablet (30 mg total) by mouth daily., Disp: 90 tablet, Rfl: 1 .  metoprolol tartrate (LOPRESSOR) 25 MG tablet, Take 0.5 tablets (12.5 mg total) by mouth 2 (two) times daily. (Patient taking differently: Take 25 mg by mouth 2 (two) times daily. ), Disp: 180 tablet, Rfl: 0 .  Multiple  Vitamins-Minerals (PRESERVISION/LUTEIN PO), Take by mouth 2 (two) times daily., Disp: , Rfl:  .  nitroGLYCERIN (NITROSTAT) 0.4 MG SL tablet, Place 0.4 mg under the tongue every 5 (five) minutes as needed for chest pain., Disp: , Rfl:  .  pantoprazole (PROTONIX) 40 MG tablet, Take 1 tablet (40 mg total) by mouth daily., Disp: 90 tablet, Rfl: 0 .  polyethylene glycol (MIRALAX / GLYCOLAX) packet, Take 17 g by mouth daily., Disp: , Rfl:  .  pravastatin (PRAVACHOL) 40 MG tablet, Take 1 tablet (40 mg total) by mouth daily., Disp: 90 tablet, Rfl: 0 .  sertraline (ZOLOFT) 100 MG tablet, Take 1.5 tablets (150 mg total) by mouth at bedtime., Disp: 45 tablet, Rfl: 1 .  traMADol (ULTRAM) 50 MG tablet, Take by mouth., Disp: , Rfl:   Allergies  Allergen Reactions  . Sulfamethoxazole-Trimethoprim Nausea Only and Other (See Comments)  . Baclofen   . Nsaids     Avoids due to liver.  . Tolmetin Other (See Comments)    Avoids due to liver.     Review of Systems  HENT: Negative for sore throat.   Eyes: Negative for blurred vision.  Respiratory: Negative for cough and shortness of breath.   Cardiovascular: Negative for chest pain and palpitations.  Gastrointestinal: Negative for abdominal pain, heartburn and nausea.  Neurological: Negative for headaches.      Objective  Vitals:   08/31/16 1531  BP: (!) 142/58  Pulse: 78  Resp: 16  Temp: 98.2 F (36.8 C)  TempSrc: Oral  SpO2: 94%  Weight: 194 lb 14.4 oz (88.4 kg)  Height: 5' 10" (1.778 m)    Physical Exam  Constitutional: He is oriented to person, place, and time and well-developed, well-nourished, and in no distress.  HENT:  Head: Normocephalic and atraumatic.  Cardiovascular: Normal rate, regular rhythm and normal heart sounds.   No murmur heard. Pulmonary/Chest: Effort normal and breath sounds normal. He has no wheezes.  Abdominal: Soft. Bowel sounds are normal. There is no tenderness.  Neurological: He is alert and oriented to  person, place, and time.  Psychiatric: Mood, memory, affect and judgment normal.  Nursing note and vitals reviewed.      Recent Results (from the past 2160 hour(s))  POCT HgB A1C     Status: Normal   Collection Time: 08/10/16 11:05 AM  Result Value Ref Range   Hemoglobin A1C 6.0      Assessment & Plan   1. Benign hypertension continue on Metoprolol 50 mg in a.m., decrease to 25 mg at night for  below normal diastolic blood pressure - metoprolol tartrate (LOPRESSOR) 25 MG tablet; 50 mg po qAM, 25 mg po qHS  Dispense: 270 tablet; Refill: 0  2. Gastroesophageal reflux disease without esophagitis  - pantoprazole (PROTONIX) 40 MG tablet; Take 1 tablet (40 mg total) by mouth daily.  Dispense: 90 tablet; Refill: 0   Arrionna Serena Asad A. [facility] 08/31/2016 4:12 PM

## 2016-09-06 ENCOUNTER — Emergency Department
Admission: EM | Admit: 2016-09-06 | Discharge: 2016-09-06 | Disposition: A | Discharge status: Home / Self Care | Payer: Medicare Other | Attending: Emergency Medicine | Admitting: Emergency Medicine

## 2016-09-06 ENCOUNTER — Emergency Department: Payer: Medicare Other

## 2016-09-06 DIAGNOSIS — Y998 Other external cause status: Secondary | ICD-10-CM | POA: Insufficient documentation

## 2016-09-06 DIAGNOSIS — I10 Essential (primary) hypertension: Secondary | ICD-10-CM | POA: Insufficient documentation

## 2016-09-06 DIAGNOSIS — Z87891 Personal history of nicotine dependence: Secondary | ICD-10-CM | POA: Diagnosis not present

## 2016-09-06 DIAGNOSIS — S0990XA Unspecified injury of head, initial encounter: Secondary | ICD-10-CM | POA: Diagnosis present

## 2016-09-06 DIAGNOSIS — W06XXXA Fall from bed, initial encounter: Secondary | ICD-10-CM | POA: Diagnosis not present

## 2016-09-06 DIAGNOSIS — S0101XA Laceration without foreign body of scalp, initial encounter: Secondary | ICD-10-CM | POA: Diagnosis not present

## 2016-09-06 DIAGNOSIS — Y92122 Bedroom in nursing home as the place of occurrence of the external cause: Secondary | ICD-10-CM | POA: Insufficient documentation

## 2016-09-06 DIAGNOSIS — Y93E8 Activity, other personal hygiene: Secondary | ICD-10-CM | POA: Insufficient documentation

## 2016-09-06 DIAGNOSIS — Z7902 Long term (current) use of antithrombotics/antiplatelets: Secondary | ICD-10-CM | POA: Insufficient documentation

## 2016-09-06 DIAGNOSIS — I2510 Atherosclerotic heart disease of native coronary artery without angina pectoris: Secondary | ICD-10-CM | POA: Insufficient documentation

## 2016-09-06 DIAGNOSIS — Z79899 Other long term (current) drug therapy: Secondary | ICD-10-CM | POA: Diagnosis not present

## 2016-09-06 NOTE — ED Triage Notes (Addendum)
Per EMS pt comes from [facility] apts and the pt was sitting on the edge of the bed trying to get his pants on and fell forward and hit is head on the edge of the door.  Pt denies LOC.  Pt states 4/10 pain on top of head.  Pt also has a skin tear on neck.  Pt is A&Ox4 and hard of hearing.

## 2016-09-06 NOTE — ED Provider Notes (Signed)
[facility] Emergency Department Provider Note ____________________________________________   I have reviewed the triage vital signs and the triage nursing note.  HISTORY  Chief Complaint Fall   Historian Patient and spouse  HPI [name] is a 83 y.o. male here for evaluation of head injury, scalp laceration. He was sitting on the side of the bed put 1 foot in his pants and as he went but the other foot into his pants leg he slid off the bed and his head/scalp struck the corner of the wall which was relatively sharp and in [place] a large scalp laceration. Patient did not lose consciousness. Denies any new neck pain. He does have chronic neck pain but states no new neck pain from the fall. No additional traumatic injuries including noted extremity trauma, back trauma chest or abdominal trauma.  Symptoms are moderate, not made worse or better by anything.    Past Medical History:  Diagnosis Date  . Anxiety   . BPH (benign prostatic hypertrophy)   . Carotid stenosis    a. 05/2015 Carotid U/S: mild nonobs atherosclerosis. No need for f/u.  Marland Kitchen Chronic back pain   . Coronary artery disease    a. 06/2013 Cath/PCI: LAD 50-70p (FFR 0.82-->PCI w/ 3.5x24 Promus DES), RCA subtotally occluded w/ L->R collats, LCX 30ost.  . Dyslipidemia   . GERD (gastroesophageal reflux disease)   . Hx of [name]   . Hyperlipidemia   . Hypertension   . Inguinal hernia   . Lightheadedness    a. chronic, somewhat positional.  . 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 Major depression   . Multiple myeloma ([place])   . Pleural effusion    left  . Sleep apnea     Patient Active Problem List   Diagnosis Date Noted  . Groin pain, left 06/15/2016  . Closed right hip fracture, with routine healing, subsequent encounter 04/15/2016  . Productive cough 03/23/2016  . Medicare annual wellness visit, subsequent 02/19/2016  . Major depression 10/14/2015  . Behavior disturbance 09/12/2015  .  Mobility impaired 08/27/2015  . Skin ulcer of back ([place]) 07/01/2015  . Laceration of right hand 07/01/2015  . Traumatic hematoma of lower back 07/01/2015  . Encounters for administrative purpose 04/01/2015  . Skin lesion of face 12/24/2014  . Contusion of right hand 11/28/2014  . Herpes zoster 10/30/2014  . Need for immunization against influenza 10/24/2014  . Compression fracture of L4 lumbar vertebra ([facility]) 09/28/2014  . Rectal or anal pain 08/13/2014  . Anxiety disorder 07/23/2014  . BPH without obstruction/lower urinary tract symptoms 07/23/2014  . Atherosclerosis of coronary artery 07/23/2014  . Chronic LBP 07/23/2014  . Hypercholesteremia 07/23/2014  . Benign hypertension 07/23/2014  . Acid reflux 07/23/2014  . Chronic recurrent major depressive disorder ([place]) 07/23/2014  . GERD (gastroesophageal reflux disease) 07/23/2014  . Coronary artery disease involving native coronary artery without angina pectoris 07/06/2014  . Essential hypertension 07/06/2014  . Carotid stenosis 07/06/2014  . Hyperlipidemia   . H/O [name] 12/20/2011    Past Surgical History:  Procedure Laterality Date  . ANTERIOR APPROACH HEMI HIP ARTHROPLASTY Right 04/16/2016   Procedure: ANTERIOR APPROACH HEMI HIP ARTHROPLASTY;  Surgeon: Hessie Knows, MD;  Location: [facility] ORS;  Service: Orthopedics;  Laterality: Right;  . APPENDECTOMY    . CARDIAC CATHETERIZATION  2002  . CARDIAC CATHETERIZATION  06/2013  . COLONOSCOPY    . FEMUR FRACTURE SURGERY Left    with rod  . HIP FRACTURE SURGERY    .  JOINT REPLACEMENT     right partial knee replacement    Prior to Admission medications   Medication Sig Start Date End Date Taking? Authorizing Provider  acetaminophen (TYLENOL) 500 MG tablet Take 2 tablets (1,000 mg total) by mouth every 6 (six) hours. 04/17/16  Yes Dustin Flock, MD  clonazePAM (KLONOPIN) 0.5 MG tablet Take 1 tablet (0.5 mg total) by mouth 3 (three) times daily as needed for anxiety. 07/28/16  Yes  Roselee Nova, MD  docusate sodium (COLACE) 100 MG capsule Take 1 capsule (100 mg total) by mouth 2 (two) times daily. 04/17/16  Yes Dustin Flock, MD  doxazosin (CARDURA) 1 MG tablet Take 1 tablet (1 mg total) by mouth daily. 08/26/16  Yes Roselee Nova, MD  isosorbide mononitrate (IMDUR) 30 MG 24 hr tablet Take 1 tablet (30 mg total) by mouth daily. 06/10/16  Yes Rochel Brome A, MD  lisinopril (PRINIVIL,ZESTRIL) 2.5 MG tablet Take 2.5 mg by mouth daily.   Yes [provider]  metoprolol tartrate (LOPRESSOR) 25 MG tablet 50 mg po qAM, 25 mg po qHS Patient taking differently: Take 25-50 mg by mouth 2 (two) times daily. 50 mg po qAM, 25 mg po qHS 08/31/16  Yes Roselee Nova, MD  Multiple Vitamins-Minerals (PRESERVISION/LUTEIN PO) Take by mouth 2 (two) times daily.   Yes [provider]  nitroGLYCERIN (NITROSTAT) 0.4 MG SL tablet Place 0.4 mg under the tongue every 5 (five) minutes as needed for chest pain.   Yes [provider]  pantoprazole (PROTONIX) 40 MG tablet Take 1 tablet (40 mg total) by mouth daily. 08/31/16  Yes Roselee Nova, MD  polyethylene glycol [facility] / GLYCOLAX) packet Take 17 g by mouth daily.   Yes [provider]  pravastatin (PRAVACHOL) 40 MG tablet Take 1 tablet (40 mg total) by mouth daily. 08/10/16  Yes Roselee Nova, MD  sertraline (ZOLOFT) 100 MG tablet Take 1.5 tablets (150 mg total) by mouth at bedtime. 08/10/16  Yes Roselee Nova, MD  traMADol (ULTRAM) 50 MG tablet Take 50 mg by mouth every 6 (six) hours as needed for moderate pain or severe pain.  07/10/16  Yes [provider]    Allergies  Allergen Reactions  . Sulfamethoxazole-Trimethoprim Nausea Only and Other (See Comments)  . Baclofen   . Nsaids     Avoids due to liver.  . Tolmetin Other (See Comments)    Avoids due to liver.    Family History  Problem Relation Age of Onset  . Heart disease Mother   . Stroke Mother   . Diabetes Father    . Dementia Father   . Stroke Sister   . Stroke Brother   . Stroke Sister   . Stroke Sister   . Stroke Brother   . Stroke Brother     Social History Social History  Substance Use Topics  . Smoking status: Former Smoker    Packs/day: 2.00    Years: 7.00    Types: Cigarettes    Quit date: 09/17/1953  . Smokeless tobacco: Never Used  . Alcohol use No    Review of Systems  Constitutional: Negative for fever. Eyes: Negative for visual changes. [name]: Negative for sore throat. Cardiovascular: Negative for chest pain. Respiratory: Negative for shortness of breath. Gastrointestinal: Negative for abdominal pain, vomiting and diarrhea. Genitourinary: Negative for dysuria. Musculoskeletal:Positive for chronic low back pain, no acute worsening of back pain. Skin: Negative for rash. Neurological:  Negative for headache.  ____________________________________________   PHYSICAL EXAM:  VITAL SIGNS: ED Triage Vitals  Enc Vitals Group     BP 09/06/16 0805 (!) 146/59     Pulse Rate 09/06/16 0805 69     Resp 09/06/16 0805 16     Temp 09/06/16 0805 98.4 F (36.9 C)     Temp Source 09/06/16 0805 Oral     SpO2 09/06/16 0805 98 %     Weight 09/06/16 0754 190 lb (86.2 kg)     Height 09/06/16 0754 '5\' 10"'$  (1.778 m)     Head Circumference --      Peak Flow --      Pain Score 09/06/16 0754 4     Pain Loc --      Pain Edu? --      Excl. in [place]? --      Constitutional: Alert and oriented. Well appearing and in no distress. HEENT   Head: Normocephalic.  Large scalp laceration, hemostatic.      Eyes: Conjunctivae are normal. Pupils equal and round.       Ears:         Nose: No congestion/rhinnorhea.   Mouth/Throat: Mucous membranes are moist.   Neck: No stridor.  No C-spine tenderness to palpation or range of motion although he does have limited range of motion at baseline. Cardiovascular/Chest: Normal rate, regular rhythm.  No murmurs, rubs, or gallops. Respiratory: Normal  respiratory effort without tachypnea nor retractions. Breath sounds are clear and equal bilaterally. No wheezes/rales/rhonchi. Gastrointestinal: Soft. No distention, no guarding, no rebound. Nontender.    Genitourinary/rectal:Deferred Musculoskeletal: Pelvis stable and nontender. Nontender with normal range of motion in all extremities. No joint effusions.  No lower extremity tenderness.  No edema. Neurologic:  Normal speech and language. No gross or focal neurologic deficits are appreciated. Skin:  Skin is warm, dry and intact. No rash noted. Psychiatric: Mood and affect are normal. Speech and behavior are normal. Patient exhibits appropriate insight and judgment.   ____________________________________________  LABS (pertinent positives/negatives)  Labs Reviewed - No data to display  ____________________________________________    EKG I, Lisa Roca, MD, the attending physician have personally viewed and interpreted all ECGs.  None ____________________________________________  RADIOLOGY All Xrays were viewed by me. Imaging interpreted by Radiologist.  CT head without contrast:IMPRESSION: No acute intracranial findings.  Mild soft tissue swelling over the high left posterior parietal scalp.  Chronic ischemic microvascular disease. __________________________________________  PROCEDURES  Procedure(s) performed: LACERATION REPAIR Performed by: Lisa Roca Authorized by: Lisa Roca Consent: Verbal consent obtained. Risks and benefits: risks, benefits and alternatives were discussed Consent given by: patient Patient identity confirmed: provided demographic data Prepped and Draped in normal sterile fashion Wound explored  Laceration Location: scalp  Laceration Length: approx 20cm  No Foreign Bodies seen or palpated   Irrigation method: syringe Amount of cleaning: standard  Skin closure:staples Number of staples:  16  Patient tolerance: Patient tolerated the  procedure well with no immediate complications.   Critical Care performed: None  ____________________________________________   ED COURSE / ASSESSMENT AND PLAN  Pertinent labs & imaging results that were available during my care of the patient were reviewed by me and considered in my medical decision making (see chart for details).   No additional traumatic injury aside from the visible scalp laceration. I did discuss obtaining a head CT given the significance of the laceration. Patient is alert and oriented and C-spine is cleared clinically.  Tetanus is up-to-date.  Scalp laceration  repaired with staples.  CT reassuring for no intracranial injury.    CONSULTATIONS:  None   Patient / Family / Caregiver informed of clinical course, medical decision-making process, and agree with plan.   I discussed return precautions, follow-up instructions, and discharge instructions with patient and/or family.  Discharge Instructions : You were evaluated after head injury, and scalp wound was repaired with 16 staples. Please call your primary care doctor or the [facility] for staple removal in approximately 14 days.  Return to emergency room immediately for any worsening condition including confusion altered mental status, continued bleeding, worsening pain, or any signs of infection such as fever or foul drainage.  ___________________________________________   FINAL CLINICAL IMPRESSION(S) / ED DIAGNOSES   Final diagnoses:  Laceration of scalp, initial encounter              Note: This dictation was prepared with Dragon dictation. Any transcriptional errors that result from this process are unintentional    Lisa Roca, MD 09/06/16 1140

## 2016-09-06 NOTE — Discharge Instructions (Signed)
You were evaluated after head injury, and scalp wound was repaired with 16 staples. Please call your primary care doctor or the [facility] for staple removal in approximately 14 days.  Return to emergency room immediately for any worsening condition including confusion altered mental status, continued bleeding, worsening pain, or any signs of infection such as fever or foul drainage.

## 2016-09-07 ENCOUNTER — Ambulatory Visit (INDEPENDENT_AMBULATORY_CARE_PROVIDER_SITE_OTHER): Payer: Medicare Other | Admitting: Family Medicine

## 2016-09-07 ENCOUNTER — Encounter: Payer: Self-pay | Admitting: Family Medicine

## 2016-09-07 VITALS — BP 102/54 | HR 83 | Temp 98.0°F | Resp 17 | Ht 70.0 in | Wt 196.0 lb

## 2016-09-07 DIAGNOSIS — S06890D Other specified intracranial injury without loss of consciousness, subsequent encounter: Secondary | ICD-10-CM

## 2016-09-07 DIAGNOSIS — S069X0A Unspecified intracranial injury without loss of consciousness, initial encounter: Secondary | ICD-10-CM | POA: Insufficient documentation

## 2016-09-07 DIAGNOSIS — I10 Essential (primary) hypertension: Secondary | ICD-10-CM | POA: Diagnosis not present

## 2016-09-07 DIAGNOSIS — I2510 Atherosclerotic heart disease of native coronary artery without angina pectoris: Secondary | ICD-10-CM

## 2016-09-07 DIAGNOSIS — S1191XD Laceration without foreign body of unspecified part of neck, subsequent encounter: Secondary | ICD-10-CM | POA: Diagnosis not present

## 2016-09-07 DIAGNOSIS — S069X0D Unspecified intracranial injury without loss of consciousness, subsequent encounter: Secondary | ICD-10-CM

## 2016-09-07 MED ORDER — ISOSORBIDE MONONITRATE ER 30 MG PO TB24: 30.0000 mg | ORAL_TABLET | Freq: Every day | ORAL | 1 refills | Status: DC

## 2016-09-07 NOTE — Progress Notes (Signed)
Name: [name]   MRN: [id number]    DOB: [birth year]   Date:09/07/2016       Progress Note  Subjective  Chief Complaint  Chief Complaint  Patient presents with  . Hospitalization Follow-up    Pt fell yesterday morning 09/06/16 and cut top of head    HPI  Pt. Presents from the ED after he was seen yesterday morning s/p fall on the floor from the edge of the bed, hitting his head on the projected edge of the wall and causing a moderate sized laceration requiring stitches for closure. His CT brain and Spine were unremarkable except for soft tissue swelling, he feels well, reports no pain at this time.    Past Medical History:  Diagnosis Date  . Anxiety   . BPH (benign prostatic hypertrophy)   . Carotid stenosis    a. 05/2015 Carotid U/S: mild nonobs atherosclerosis. No need for f/u.  Marland Kitchen Chronic back pain   . Coronary artery disease    a. 06/2013 Cath/PCI: LAD 50-70p (FFR 0.82-->PCI w/ 3.5x24 Promus DES), RCA subtotally occluded w/ L->R collats, LCX 30ost.  . Dyslipidemia   . GERD (gastroesophageal reflux disease)   . Hx of [name]   . Hyperlipidemia   . Hypertension   . Inguinal hernia   . Lightheadedness    a. chronic, somewhat positional.  . 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 Major depression   . Multiple myeloma ([place])   . Pleural effusion    left  . Sleep apnea     Past Surgical History:  Procedure Laterality Date  . ANTERIOR APPROACH HEMI HIP ARTHROPLASTY Right 04/16/2016   Procedure: ANTERIOR APPROACH HEMI HIP ARTHROPLASTY;  Surgeon: Hessie Knows, MD;  Location: [facility] ORS;  Service: Orthopedics;  Laterality: Right;  . APPENDECTOMY    . CARDIAC CATHETERIZATION  2002  . CARDIAC CATHETERIZATION  06/2013  . COLONOSCOPY    . FEMUR FRACTURE SURGERY Left    with rod  . HIP FRACTURE SURGERY    . JOINT REPLACEMENT     right partial knee replacement    Family History  Problem Relation Age of Onset  . Heart disease Mother   . Stroke Mother   . Diabetes Father   .  Dementia Father   . Stroke Sister   . Stroke Brother   . Stroke Sister   . Stroke Sister   . Stroke Brother   . Stroke Brother     Social History   Social History  . Marital status: Married    Spouse name: N/A  . Number of children: N/A  . Years of education: N/A   Occupational History  . Not on file.   Social History Main Topics  . Smoking status: Former Smoker    Packs/day: 2.00    Years: 7.00    Types: Cigarettes    Quit date: 09/17/1953  . Smokeless tobacco: Never Used  . Alcohol use No  . Drug use: No  . Sexual activity: Not Currently   Other Topics Concern  . Not on file   Social History Narrative  . No narrative on file     Current Outpatient Prescriptions:  .  acetaminophen (TYLENOL) 500 MG tablet, Take 2 tablets (1,000 mg total) by mouth every 6 (six) hours., Disp: 30 tablet, Rfl: 0 .  clonazePAM (KLONOPIN) 0.5 MG tablet, Take 1 tablet (0.5 mg total) by mouth 3 (three) times daily as needed for anxiety., Disp: 90 tablet, Rfl: 2 .  docusate  sodium (COLACE) 100 MG capsule, Take 1 capsule (100 mg total) by mouth 2 (two) times daily., Disp: 10 capsule, Rfl: 0 .  doxazosin (CARDURA) 1 MG tablet, Take 1 tablet (1 mg total) by mouth daily., Disp: 90 tablet, Rfl: 0 .  isosorbide mononitrate (IMDUR) 30 MG 24 hr tablet, Take 1 tablet (30 mg total) by mouth daily., Disp: 90 tablet, Rfl: 1 .  lisinopril (PRINIVIL,ZESTRIL) 2.5 MG tablet, Take 2.5 mg by mouth daily., Disp: , Rfl:  .  metoprolol tartrate (LOPRESSOR) 25 MG tablet, 50 mg po qAM, 25 mg po qHS (Patient taking differently: Take 25-50 mg by mouth 2 (two) times daily. 50 mg po qAM, 25 mg po qHS), Disp: 270 tablet, Rfl: 0 .  Multiple Vitamins-Minerals (PRESERVISION/LUTEIN PO), Take by mouth 2 (two) times daily., Disp: , Rfl:  .  nitroGLYCERIN (NITROSTAT) 0.4 MG SL tablet, Place 0.4 mg under the tongue every 5 (five) minutes as needed for chest pain., Disp: , Rfl:  .  pantoprazole (PROTONIX) 40 MG tablet, Take 1  tablet (40 mg total) by mouth daily., Disp: 90 tablet, Rfl: 0 .  polyethylene glycol (MIRALAX / GLYCOLAX) packet, Take 17 g by mouth daily., Disp: , Rfl:  .  pravastatin (PRAVACHOL) 40 MG tablet, Take 1 tablet (40 mg total) by mouth daily., Disp: 90 tablet, Rfl: 0 .  sertraline (ZOLOFT) 100 MG tablet, Take 1.5 tablets (150 mg total) by mouth at bedtime., Disp: 45 tablet, Rfl: 1 .  traMADol (ULTRAM) 50 MG tablet, Take 50 mg by mouth every 6 (six) hours as needed for moderate pain or severe pain. , Disp: , Rfl:   Allergies  Allergen Reactions  . Sulfamethoxazole-Trimethoprim Nausea Only and Other (See Comments)  . Baclofen   . Nsaids     Avoids due to liver.  . Tolmetin Other (See Comments)    Avoids due to liver.     ROS  Please see history of present illness for complete discussion of ROS  Objective  Vitals:   09/07/16 1352  BP: (!) 102/54  Pulse: 83  Resp: 17  Temp: 98 F (36.7 C)  TempSrc: Oral  SpO2: 93%  Weight: 196 lb (88.9 kg)  Height: '5\' 10"'$  (1.778 m)    Physical Exam  Constitutional: He is oriented to person, place, and time and well-developed, well-nourished, and in no distress.  HENT:  Head: Normocephalic. Head is with laceration.    Large sized laceration over the left lateral parietal forehead with dried blood visible slong the staple-line, no evidence of fresh bleeding, no tenderness to palpation.   Neck:    Small laceration on the right anterior side of neck with no evidence of fresh bleeding,  Cardiovascular: Normal rate, regular rhythm and normal heart sounds.   No murmur heard. Pulmonary/Chest: Effort normal and breath sounds normal. He has no wheezes.  Neurological: He is alert and oriented to person, place, and time.  Psychiatric: Mood, memory, affect and judgment normal.  Nursing note and vitals reviewed.     Assessment & Plan  1. Intracranial injury without loss of consciousness or fracture, subsequent encounter Reviewed ER notes,  patient received staples for hemostasis in the ER, changed dressing and schedule for an appointment for follow-up in one week.  2. Laceration of neck, subsequent encounter Cleansed with normal saline, changed dressing, routine care  3. Benign hypertension BP stable on present antihypertensive treatment  4. Coronary artery disease involving native coronary artery of native heart without angina pectoris  - isosorbide  mononitrate (IMDUR) 30 MG 24 hr tablet; Take 1 tablet (30 mg total) by mouth daily.  Dispense: 90 tablet; Refill: 1   [facility] 09/07/2016 2:04 PM

## 2016-09-12 ENCOUNTER — Emergency Department: Payer: Medicare Other

## 2016-09-12 ENCOUNTER — Emergency Department
Admission: EM | Admit: 2016-09-12 | Discharge: 2016-09-12 | Disposition: A | Discharge status: Home / Self Care | Payer: Medicare Other | Attending: Emergency Medicine | Admitting: Emergency Medicine

## 2016-09-12 ENCOUNTER — Encounter: Payer: Self-pay | Admitting: Emergency Medicine

## 2016-09-12 DIAGNOSIS — Z87891 Personal history of nicotine dependence: Secondary | ICD-10-CM | POA: Insufficient documentation

## 2016-09-12 DIAGNOSIS — M6281 Muscle weakness (generalized): Secondary | ICD-10-CM | POA: Insufficient documentation

## 2016-09-12 DIAGNOSIS — Z96651 Presence of right artificial knee joint: Secondary | ICD-10-CM | POA: Diagnosis not present

## 2016-09-12 DIAGNOSIS — I10 Essential (primary) hypertension: Secondary | ICD-10-CM | POA: Insufficient documentation

## 2016-09-12 DIAGNOSIS — I259 Chronic ischemic heart disease, unspecified: Secondary | ICD-10-CM | POA: Insufficient documentation

## 2016-09-12 DIAGNOSIS — R531 Weakness: Secondary | ICD-10-CM | POA: Diagnosis present

## 2016-09-12 DIAGNOSIS — E860 Dehydration: Secondary | ICD-10-CM

## 2016-09-12 DIAGNOSIS — Z79899 Other long term (current) drug therapy: Secondary | ICD-10-CM | POA: Insufficient documentation

## 2016-09-12 LAB — BASIC METABOLIC PANEL
Anion gap: 7 (ref 5–15)
BUN: 32 mg/dL — ABNORMAL HIGH (ref 6–20)
CO2: 24 mmol/L (ref 22–32)
Calcium: 9.1 mg/dL (ref 8.9–10.3)
Chloride: 106 mmol/L (ref 101–111)
Creatinine, Ser: 1.55 mg/dL — ABNORMAL HIGH (ref 0.61–1.24)
GFR calc Af Amer: 44 mL/min — ABNORMAL LOW (ref >60)
GFR calc non Af Amer: 38 mL/min — ABNORMAL LOW (ref >60)
Glucose, Bld: 112 mg/dL — ABNORMAL HIGH (ref 65–99)
Potassium: 4.7 mmol/L (ref 3.5–5.1)
Sodium: 137 mmol/L (ref 135–145)

## 2016-09-12 LAB — CBC
HCT: 31.4 % — ABNORMAL LOW (ref 40.0–52.0)
Hemoglobin: 10.4 g/dL — ABNORMAL LOW (ref 13.0–18.0)
MCH: 27 pg (ref 26.0–34.0)
MCHC: 33.1 g/dL (ref 32.0–36.0)
MCV: 81.6 fL (ref 80.0–100.0)
Platelets: 200 10*3/uL (ref 150–440)
RBC: 3.85 MIL/uL — ABNORMAL LOW (ref 4.40–5.90)
RDW: 17.6 % — ABNORMAL HIGH (ref 11.5–14.5)
WBC: 12.6 10*3/uL — ABNORMAL HIGH (ref 3.8–10.6)

## 2016-09-12 LAB — URINALYSIS, COMPLETE (UACMP) WITH MICROSCOPIC
Bacteria, UA: NONE SEEN
Bilirubin Urine: NEGATIVE
Glucose, UA: NEGATIVE mg/dL
Hgb urine dipstick: NEGATIVE
Ketones, ur: NEGATIVE mg/dL
Leukocytes, UA: NEGATIVE
Nitrite: NEGATIVE
Protein, ur: NEGATIVE mg/dL
Specific Gravity, Urine: 1.015 (ref 1.005–1.030)
Squamous Epithelial / LPF: NONE SEEN
WBC, UA: NONE SEEN WBC/hpf (ref 0–5)
pH: 5 (ref 5.0–8.0)

## 2016-09-12 LAB — TROPONIN I: Troponin I: <0.03 ng/mL (ref <0.03)

## 2016-09-12 NOTE — ED Triage Notes (Signed)
Pt here for weakness to both knees and legs.  Does have noted right facial droop to mouth and right eye that is NOT knew and is r/t his bells palsy per pt.   Pt unable to explain why here.  Pt fell 5 days ago and was here; had large laceration to head with staples present.  Pt seems to have trouble getting words out in triage and per granddaughter this is new since last night.  Pt has had increasing weakness as well since yesterday.  Family verified right facial droop is not new.

## 2016-09-12 NOTE — ED Notes (Signed)
Patient's grand-daughter is at the RN station talking to the MD about leaving. MD Convinced family to stay for neuro exam

## 2016-09-12 NOTE — ED Provider Notes (Signed)
[facility] Emergency Department Provider Note  Time seen: 6:17 PM  I have reviewed the triage vital signs and the nursing notes.   HISTORY  Chief Complaint Weakness    HPI [name] is a 83 y.o. male with a past medical history of gastric reflux, hypertension, hyperlipidemia, history of multiple falls in the past presents to the emergency department for generalized weakness. According to the patient and his wife they live at [facility], she has noticed over the past several days some increased weakness of his legs bilaterally. States they are having to sit down and rest more often than he used to. Patient states he still is able to ambulate with a walker which is baseline for the patient. Denies any weakness or numbness of any arm or leg in particular.Denies any confusion or slurred speech. Patient does have a right facial droop which is long-standing due to [name]. Patient did have a recent fall several days ago. Denies any new pains besides his chronic lower back pain.  Past Medical History:  Diagnosis Date  . Anxiety   . BPH (benign prostatic hypertrophy)   . Carotid stenosis    a. 05/2015 Carotid U/S: mild nonobs atherosclerosis. No need for f/u.  Marland Kitchen Chronic back pain   . Coronary artery disease    a. 06/2013 Cath/PCI: LAD 50-70p (FFR 0.82-->PCI w/ 3.5x24 Promus DES), RCA subtotally occluded w/ L->R collats, LCX 30ost.  . Dyslipidemia   . GERD (gastroesophageal reflux disease)   . Hx of [name]   . Hyperlipidemia   . Hypertension   . Inguinal hernia   . Lightheadedness    a. chronic, somewhat positional.  . 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 Major depression   . Multiple myeloma ([place])   . Pleural effusion    left  . Sleep apnea     Patient Active Problem List   Diagnosis Date Noted  . Head injury, intracranial, without loss of consciousness or fracture ([place]) 09/07/2016  . Groin pain, left 06/15/2016  . Closed right hip fracture, with  routine healing, subsequent encounter 04/15/2016  . Productive cough 03/23/2016  . Medicare annual wellness visit, subsequent 02/19/2016  . Major depression 10/14/2015  . Behavior disturbance 09/12/2015  . Mobility impaired 08/27/2015  . Skin ulcer of back ([place]) 07/01/2015  . Laceration of right hand 07/01/2015  . Traumatic hematoma of lower back 07/01/2015  . Encounters for administrative purpose 04/01/2015  . Skin lesion of face 12/24/2014  . Contusion of right hand 11/28/2014  . Herpes zoster 10/30/2014  . Need for immunization against influenza 10/24/2014  . Compression fracture of L4 lumbar vertebra ([facility]) 09/28/2014  . Rectal or anal pain 08/13/2014  . Anxiety disorder 07/23/2014  . BPH without obstruction/lower urinary tract symptoms 07/23/2014  . Atherosclerosis of coronary artery 07/23/2014  . Chronic LBP 07/23/2014  . Hypercholesteremia 07/23/2014  . Benign hypertension 07/23/2014  . Acid reflux 07/23/2014  . Chronic recurrent major depressive disorder ([place]) 07/23/2014  . GERD (gastroesophageal reflux disease) 07/23/2014  . Coronary artery disease involving native coronary artery without angina pectoris 07/06/2014  . Essential hypertension 07/06/2014  . Carotid stenosis 07/06/2014  . Hyperlipidemia   . H/O [name] 12/20/2011    Past Surgical History:  Procedure Laterality Date  . ANTERIOR APPROACH HEMI HIP ARTHROPLASTY Right 04/16/2016   Procedure: ANTERIOR APPROACH HEMI HIP ARTHROPLASTY;  Surgeon: Hessie Knows, MD;  Location: [facility] ORS;  Service: Orthopedics;  Laterality: Right;  . APPENDECTOMY    .  CARDIAC CATHETERIZATION  2002  . CARDIAC CATHETERIZATION  06/2013  . COLONOSCOPY    . FEMUR FRACTURE SURGERY Left    with rod  . HIP FRACTURE SURGERY    . JOINT REPLACEMENT     right partial knee replacement    Prior to Admission medications   Medication Sig Start Date End Date Taking? Authorizing Provider  acetaminophen (TYLENOL) 500 MG tablet Take 2 tablets  (1,000 mg total) by mouth every 6 (six) hours. 04/17/16   Dustin Flock, MD  clonazePAM (KLONOPIN) 0.5 MG tablet Take 1 tablet (0.5 mg total) by mouth 3 (three) times daily as needed for anxiety. 07/28/16   Roselee Nova, MD  docusate sodium (COLACE) 100 MG capsule Take 1 capsule (100 mg total) by mouth 2 (two) times daily. 04/17/16   Dustin Flock, MD  doxazosin (CARDURA) 1 MG tablet Take 1 tablet (1 mg total) by mouth daily. 08/26/16   Roselee Nova, MD  isosorbide mononitrate (IMDUR) 30 MG 24 hr tablet Take 1 tablet (30 mg total) by mouth daily. 09/07/16   Roselee Nova, MD  lisinopril (PRINIVIL,ZESTRIL) 2.5 MG tablet Take 2.5 mg by mouth daily.    [provider]  metoprolol tartrate (LOPRESSOR) 25 MG tablet 50 mg po qAM, 25 mg po qHS Patient taking differently: Take 25-50 mg by mouth 2 (two) times daily. 50 mg po qAM, 25 mg po qHS 08/31/16   Roselee Nova, MD  Multiple Vitamins-Minerals (PRESERVISION/LUTEIN PO) Take by mouth 2 (two) times daily.    [provider]  nitroGLYCERIN (NITROSTAT) 0.4 MG SL tablet Place 0.4 mg under the tongue every 5 (five) minutes as needed for chest pain.    [provider]  pantoprazole (PROTONIX) 40 MG tablet Take 1 tablet (40 mg total) by mouth daily. 08/31/16   Roselee Nova, MD  polyethylene glycol [facility] / Floria Raveling) packet Take 17 g by mouth daily.    [provider]  pravastatin (PRAVACHOL) 40 MG tablet Take 1 tablet (40 mg total) by mouth daily. 08/10/16   Roselee Nova, MD  sertraline (ZOLOFT) 100 MG tablet Take 1.5 tablets (150 mg total) by mouth at bedtime. 08/10/16   Roselee Nova, MD  traMADol (ULTRAM) 50 MG tablet Take 50 mg by mouth every 6 (six) hours as needed for moderate pain or severe pain.  07/10/16   [provider]    Allergies  Allergen Reactions  . Sulfamethoxazole-Trimethoprim Nausea Only and Other (See Comments)  . Baclofen   . Nsaids     Avoids due to liver.  .  Tolmetin Other (See Comments)    Avoids due to liver.    Family History  Problem Relation Age of Onset  . Heart disease Mother   . Stroke Mother   . Diabetes Father   . Dementia Father   . Stroke Sister   . Stroke Brother   . Stroke Sister   . Stroke Sister   . Stroke Brother   . Stroke Brother     Social History Social History  Substance Use Topics  . Smoking status: Former Smoker    Packs/day: 2.00    Years: 7.00    Types: Cigarettes    Quit date: 09/17/1953  . Smokeless tobacco: Never Used  . Alcohol use No    Review of Systems Constitutional: Negative for fever.Positive for generalized weakness. [name]: Negative for congestion Cardiovascular: Negative for chest pain. Respiratory: Negative for shortness  of breath. Gastrointestinal: Negative for abdominal pain Musculoskeletal: Negative for back pain Neurological: Negative for headaches, focal weakness or numbness. All other ROS negative  ____________________________________________   PHYSICAL EXAM:  VITAL SIGNS: ED Triage Vitals  Enc Vitals Group     BP 09/12/16 1546 140/61     Pulse Rate 09/12/16 1546 96     Resp 09/12/16 1546 20     Temp 09/12/16 1546 98.4 F (36.9 C)     Temp Source 09/12/16 1546 Oral     SpO2 09/12/16 1546 98 %     Weight 09/12/16 1547 196 lb (88.9 kg)     Height 09/12/16 1547 5' 10" (1.778 m)     Head Circumference --      Peak Flow --      Pain Score 09/12/16 1545 4     Pain Loc --      Pain Edu? --      Excl. in GC? --     Constitutional: Alert and oriented. Well appearing and in no distress. Eyes: Normal exam [name]   Head: Normocephalic and atraumatic.   Mouth/Throat: Mucous membranes are moist. Cardiovascular: Normal rate, regular rhythm. Respiratory: Normal respiratory effort without tachypnea nor retractions. Breath sounds are clear a Gastrointestinal: Soft and nontender. No distention.  Musculoskeletal: Nontender with normal range of motion in all extremities. No  lower extremity tenderness or edema. Neurologic:  Normal speech and language. No gross focal neurologic deficits. Equal grip strengths bilaterally. No pronator drift. 5/5 motor in all extremities. No lower extremity drift. Patient does have a right facial droop which is long-standing per family. Skin:  Skin is warm, dry and intact.  Psychiatric: Mood and affect are normal.   ____________________________________________    EKG  EKG reviewed and interpreted by myself shows normal sinus rhythm at 93 bpm, narrow QRS, normal axis, prolonged PR interval otherwise normal intervals, nonspecific ST changes without ST elevation.  ____________________________________________    RADIOLOGY  CT head negative for acute abnormality  ____________________________________________   INITIAL IMPRESSION / ASSESSMENT AND PLAN / ED COURSE  Pertinent labs & imaging results that were available during my care of the patient were reviewed by me and considered in my medical decision making (see chart for details).  Patient presents to the emergency department with increased weakness. Patient states of legs have been feeling weak for years and denies this being an acute issue. The wife states she feels the weakness has become more significant over the past several days. Patient was in [employer] and was wishing to go home, we were able to move him back to a room for a neurological examination. His exam is reassuring with no focal deficits identified. Patient's CT scan is negative, labs are negative besides mild renal insufficiency. I discussed with the patient and family to increase oral fluids over the next 2-3 days and follow-up with his primary care doctor Tuesday or Wednesday of this coming week for recheck of his labs. I also discussed very strict return precautions. I discussed the possibility of proceeding with an MRI, they wish to go home but if the weakness worsened they states they will  return.  ____________________________________________   FINAL CLINICAL IMPRESSION(S) / ED DIAGNOSES  Generalized weakness    Paduchowski, Kevin, MD 09/12/16 1822  

## 2016-09-12 NOTE — Discharge Instructions (Signed)
As we discussed please increase her fluid consumption over the next 2-3 days and have your kidney function rechecked next week by her primary care doctor. Your blood work today shows overall normal results besides mild degree of renal dysfunction, current creatinine is 1.55. The urinalysis is normal. The CT scan of the head is normal. If your weakness progresses or you become weak in any one arm or leg confused or develop slurred speech please return immediately to the emergency department for further evaluation.

## 2016-09-14 ENCOUNTER — Ambulatory Visit (INDEPENDENT_AMBULATORY_CARE_PROVIDER_SITE_OTHER): Payer: Medicare Other | Admitting: Family Medicine

## 2016-09-14 ENCOUNTER — Encounter: Payer: Self-pay | Admitting: Family Medicine

## 2016-09-14 VITALS — BP 112/73 | HR 73 | Temp 97.7°F | Resp 16 | Ht 70.0 in | Wt 193.8 lb

## 2016-09-14 DIAGNOSIS — N179 Acute kidney failure, unspecified: Secondary | ICD-10-CM

## 2016-09-14 DIAGNOSIS — I2510 Atherosclerotic heart disease of native coronary artery without angina pectoris: Secondary | ICD-10-CM | POA: Diagnosis not present

## 2016-09-14 DIAGNOSIS — S0003XD Contusion of scalp, subsequent encounter: Secondary | ICD-10-CM

## 2016-09-14 DIAGNOSIS — D72829 Elevated white blood cell count, unspecified: Secondary | ICD-10-CM

## 2016-09-14 LAB — CBC WITH DIFFERENTIAL/PLATELET
Basophils Absolute: 0 cells/uL (ref 0–200)
Basophils Relative: 0 %
Eosinophils Absolute: 99 cells/uL (ref 15–500)
Eosinophils Relative: 1 %
HCT: 31.4 % — ABNORMAL LOW (ref 38.5–50.0)
Hemoglobin: 10.3 g/dL — ABNORMAL LOW (ref 13.2–17.1)
Lymphocytes Relative: 18 %
Lymphs Abs: 1782 cells/uL (ref 850–3900)
MCH: 27.4 pg (ref 27.0–33.0)
MCHC: 32.8 g/dL (ref 32.0–36.0)
MCV: 83.5 fL (ref 80.0–100.0)
MPV: 10.5 fL (ref 7.5–12.5)
Monocytes Absolute: 990 cells/uL — ABNORMAL HIGH (ref 200–950)
Monocytes Relative: 10 %
Neutro Abs: 7029 cells/uL (ref 1500–7800)
Neutrophils Relative %: 71 %
Platelets: 229 10*3/uL (ref 140–400)
RBC: 3.76 MIL/uL — ABNORMAL LOW (ref 4.20–5.80)
RDW: 17.4 % — ABNORMAL HIGH (ref 11.0–15.0)
WBC: 9.9 10*3/uL (ref 3.8–10.8)

## 2016-09-14 LAB — BASIC METABOLIC PANEL WITH GFR
BUN: 32 mg/dL — ABNORMAL HIGH (ref 7–25)
CO2: 23 mmol/L (ref 20–31)
Calcium: 8.9 mg/dL (ref 8.6–10.3)
Chloride: 99 mmol/L (ref 98–110)
Creat: 1.38 mg/dL — ABNORMAL HIGH (ref 0.70–1.11)
GFR, Est African American: 52 mL/min — ABNORMAL LOW (ref >=60)
GFR, Est Non African American: 45 mL/min — ABNORMAL LOW (ref >=60)
Glucose, Bld: 80 mg/dL (ref 65–99)
Potassium: 4.9 mmol/L (ref 3.5–5.3)
Sodium: 134 mmol/L — ABNORMAL LOW (ref 135–146)

## 2016-09-14 NOTE — Progress Notes (Signed)
Name: [name]   MRN: [id number]    DOB: 15-Apr-1923   Date:09/14/2016       Progress Note  Subjective  Chief Complaint  Chief Complaint  Patient presents with  . Follow-up    check staples    HPI  Pt. Presents for ER follow up, He was taken to the ER yesterday when his wife believed that patient was no longer eating and drinking as well, specifically he drinks apple juice, milk and coffee at breakfast and yesterday he did not want any drinks, also felt weak in legs which was concerning to the wife and she brought to the ER. At the ER, patient was found to be in acute renal failure, head CT was unremarkable except for hematoma, patient was discharged with instructions for close follow-up with PCP.  Patient reports feeling fine, wife reports that he is eating better and drinking as he used to, able to walk with a walker which is at baseline, no other complaints reported.  Past Medical History:  Diagnosis Date  . Anxiety   . BPH (benign prostatic hypertrophy)   . Carotid stenosis    a. 05/2015 Carotid U/S: mild nonobs atherosclerosis. No need for f/u.  Marland Kitchen Chronic back pain   . Coronary artery disease    a. 06/2013 Cath/PCI: LAD 50-70p (FFR 0.82-->PCI w/ 3.5x24 Promus DES), RCA subtotally occluded w/ L->R collats, LCX 30ost.  . Dyslipidemia   . GERD (gastroesophageal reflux disease)   . Hx of [name]   . Hyperlipidemia   . Hypertension   . Inguinal hernia   . Lightheadedness    a. chronic, somewhat positional.  . 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 Major depression   . Multiple myeloma ([place])   . Pleural effusion    left  . Sleep apnea     Past Surgical History:  Procedure Laterality Date  . ANTERIOR APPROACH HEMI HIP ARTHROPLASTY Right 04/16/2016   Procedure: ANTERIOR APPROACH HEMI HIP ARTHROPLASTY;  Surgeon: Hessie Knows, MD;  Location: [facility] ORS;  Service: Orthopedics;  Laterality: Right;  . APPENDECTOMY    . CARDIAC CATHETERIZATION  2002  . CARDIAC CATHETERIZATION   06/2013  . COLONOSCOPY    . FEMUR FRACTURE SURGERY Left    with rod  . HIP FRACTURE SURGERY    . JOINT REPLACEMENT     right partial knee replacement    Family History  Problem Relation Age of Onset  . Heart disease Mother   . Stroke Mother   . Diabetes Father   . Dementia Father   . Stroke Sister   . Stroke Brother   . Stroke Sister   . Stroke Sister   . Stroke Brother   . Stroke Brother     Social History   Social History  . Marital status: Married    Spouse name: N/A  . Number of children: N/A  . Years of education: N/A   Occupational History  . Not on file.   Social History Main Topics  . Smoking status: Former Smoker    Packs/day: 2.00    Years: 7.00    Types: Cigarettes    Quit date: 09/17/1953  . Smokeless tobacco: Never Used  . Alcohol use No  . Drug use: No  . Sexual activity: Not Currently   Other Topics Concern  . Not on file   Social History Narrative  . No narrative on file     Current Outpatient Prescriptions:  .  acetaminophen (TYLENOL) 500 MG  tablet, Take 2 tablets (1,000 mg total) by mouth every 6 (six) hours., Disp: 30 tablet, Rfl: 0 .  clonazePAM (KLONOPIN) 0.5 MG tablet, Take 1 tablet (0.5 mg total) by mouth 3 (three) times daily as needed for anxiety., Disp: 90 tablet, Rfl: 2 .  docusate sodium (COLACE) 100 MG capsule, Take 1 capsule (100 mg total) by mouth 2 (two) times daily., Disp: 10 capsule, Rfl: 0 .  doxazosin (CARDURA) 1 MG tablet, Take 1 tablet (1 mg total) by mouth daily., Disp: 90 tablet, Rfl: 0 .  isosorbide mononitrate (IMDUR) 30 MG 24 hr tablet, Take 1 tablet (30 mg total) by mouth daily., Disp: 90 tablet, Rfl: 1 .  lisinopril (PRINIVIL,ZESTRIL) 2.5 MG tablet, Take 2.5 mg by mouth daily., Disp: , Rfl:  .  metoprolol tartrate (LOPRESSOR) 25 MG tablet, 50 mg po qAM, 25 mg po qHS (Patient taking differently: Take 25-50 mg by mouth 2 (two) times daily. 50 mg po qAM, 25 mg po qHS), Disp: 270 tablet, Rfl: 0 .  Multiple  Vitamins-Minerals (PRESERVISION/LUTEIN PO), Take by mouth 2 (two) times daily., Disp: , Rfl:  .  nitroGLYCERIN (NITROSTAT) 0.4 MG SL tablet, Place 0.4 mg under the tongue every 5 (five) minutes as needed for chest pain., Disp: , Rfl:  .  pantoprazole (PROTONIX) 40 MG tablet, Take 1 tablet (40 mg total) by mouth daily., Disp: 90 tablet, Rfl: 0 .  polyethylene glycol (MIRALAX / GLYCOLAX) packet, Take 17 g by mouth daily., Disp: , Rfl:  .  pravastatin (PRAVACHOL) 40 MG tablet, Take 1 tablet (40 mg total) by mouth daily., Disp: 90 tablet, Rfl: 0 .  sertraline (ZOLOFT) 100 MG tablet, Take 1.5 tablets (150 mg total) by mouth at bedtime., Disp: 45 tablet, Rfl: 1 .  traMADol (ULTRAM) 50 MG tablet, Take 50 mg by mouth every 6 (six) hours as needed for moderate pain or severe pain. , Disp: , Rfl:   Allergies  Allergen Reactions  . Sulfamethoxazole-Trimethoprim Nausea Only and Other (See Comments)  . Baclofen   . Nsaids     Avoids due to liver.  . Tolmetin Other (See Comments)    Avoids due to liver.     ROS  Please see history of present illness for complete discussion of ROS  Objective  Vitals:   09/14/16 1211  BP: 112/73  Pulse: 73  Resp: 16  Temp: 97.7 F (36.5 C)  TempSrc: Oral  SpO2: 95%  Weight: 193 lb 12.8 oz (87.9 kg)  Height: 5' 10" (1.778 m)    Physical Exam  Constitutional: He is oriented to person, place, and time and well-developed, well-nourished, and in no distress.  HENT:  Head: Normocephalic and atraumatic.  Staples on the head injury are intact, no evidence of fresh bleeding  Cardiovascular: Normal rate, regular rhythm and normal heart sounds.   No murmur heard. Pulmonary/Chest: Effort normal and breath sounds normal. He has no wheezes.  Abdominal: Soft. Bowel sounds are normal.  Neurological: He is alert and oriented to person, place, and time.  Psychiatric: Memory, affect and judgment normal.  Nursing note and vitals reviewed.    Recent Results (from the  past 2160 hour(s))  POCT HgB A1C     Status: Normal   Collection Time: 08/10/16 11:05 AM  Result Value Ref Range   Hemoglobin A1C 6.0   Basic metabolic panel     Status: Abnormal   Collection Time: 09/12/16  3:55 PM  Result Value Ref Range   Sodium 137 135 -  145 mmol/L   Potassium 4.7 3.5 - 5.1 mmol/L   Chloride 106 101 - 111 mmol/L   CO2 24 22 - 32 mmol/L   Glucose, Bld 112 (H) 65 - 99 mg/dL   BUN 32 (H) 6 - 20 mg/dL   Creatinine, Ser 1.55 (H) 0.61 - 1.24 mg/dL   Calcium 9.1 8.9 - 10.3 mg/dL   GFR calc non Af Amer 38 (L) >60 mL/min   GFR calc Af Amer 44 (L) >60 mL/min    Comment: (NOTE) The eGFR has been calculated using the CKD EPI equation. This calculation has not been validated in all clinical situations. eGFR's persistently <60 mL/min signify possible Chronic Kidney Disease.    Anion gap 7 5 - 15  CBC     Status: Abnormal   Collection Time: 09/12/16  3:55 PM  Result Value Ref Range   WBC 12.6 (H) 3.8 - 10.6 K/uL   RBC 3.85 (L) 4.40 - 5.90 MIL/uL   Hemoglobin 10.4 (L) 13.0 - 18.0 g/dL   HCT 31.4 (L) 40.0 - 52.0 %   MCV 81.6 80.0 - 100.0 fL   MCH 27.0 26.0 - 34.0 pg   MCHC 33.1 32.0 - 36.0 g/dL   RDW 17.6 (H) 11.5 - 14.5 %   Platelets 200 150 - 440 K/uL  Urinalysis, Complete w Microscopic     Status: Abnormal   Collection Time: 09/12/16  3:55 PM  Result Value Ref Range   Color, Urine YELLOW (A) YELLOW   APPearance CLEAR (A) CLEAR   Specific Gravity, Urine 1.015 1.005 - 1.030   pH 5.0 5.0 - 8.0   Glucose, UA NEGATIVE NEGATIVE mg/dL   Hgb urine dipstick NEGATIVE NEGATIVE   Bilirubin Urine NEGATIVE NEGATIVE   Ketones, ur NEGATIVE NEGATIVE mg/dL   Protein, ur NEGATIVE NEGATIVE mg/dL   Nitrite NEGATIVE NEGATIVE   Leukocytes, UA NEGATIVE NEGATIVE   RBC / HPF 0-5 0 - 5 RBC/hpf   WBC, UA NONE SEEN 0 - 5 WBC/hpf   Bacteria, UA NONE SEEN NONE SEEN   Squamous Epithelial / LPF NONE SEEN NONE SEEN  Troponin I     Status: None   Collection Time: 09/12/16  3:55 PM   Result Value Ref Range   Troponin I <0.03 <0.03 ng/mL     Assessment & Plan  1. Acute kidney injury ([place]) Likely because of dehydration, recheck BMP, patient has resumed taking enough liquids - BASIC METABOLIC PANEL WITH GFR  2. Hematoma of scalp, subsequent encounter Stable, patient advised to follow up with ER for removal of staples  3. Leukocytosis, unspecified type  - CBC with Differential/Platelet   Syed Asad A. [facility] 09/14/2016 12:29 PM

## 2016-10-12 ENCOUNTER — Ambulatory Visit (INDEPENDENT_AMBULATORY_CARE_PROVIDER_SITE_OTHER): Payer: Medicare Other | Admitting: Family Medicine

## 2016-10-12 ENCOUNTER — Encounter: Payer: Self-pay | Admitting: Family Medicine

## 2016-10-12 ENCOUNTER — Telehealth: Payer: Self-pay | Admitting: Family Medicine

## 2016-10-12 DIAGNOSIS — F3342 Major depressive disorder, recurrent, in full remission: Secondary | ICD-10-CM | POA: Diagnosis not present

## 2016-10-12 DIAGNOSIS — I2510 Atherosclerotic heart disease of native coronary artery without angina pectoris: Secondary | ICD-10-CM

## 2016-10-12 MED ORDER — SERTRALINE HCL 100 MG PO TABS: 150.0000 mg | ORAL_TABLET | Freq: Every day | ORAL | 0 refills | Status: DC

## 2016-10-12 NOTE — Telephone Encounter (Signed)
Pt needs refill on Tramodol

## 2016-10-12 NOTE — Progress Notes (Signed)
Name: [name]   MRN: [id number]    DOB: [birth year]   Date:10/12/2016       Progress Note  Subjective  Chief Complaint  Chief Complaint  Patient presents with  . Medication Refill    Depression         This is a chronic problem.  The onset quality is gradual. The problem is unchanged.  Associated symptoms include sad.  Associated symptoms include no fatigue, does not have insomnia, no restlessness, no body aches, no headaches and no suicidal ideas.  Past treatments include SSRIs - Selective serotonin reuptake inhibitors.  Compliance with treatment is good.  Previous treatment provided significant relief.   Past Medical History:  Diagnosis Date  . Anxiety   . BPH (benign prostatic hypertrophy)   . Carotid stenosis    a. 05/2015 Carotid U/S: mild nonobs atherosclerosis. No need for f/u.  Marland Kitchen Chronic back pain   . Coronary artery disease    a. 06/2013 Cath/PCI: LAD 50-70p (FFR 0.82-->PCI w/ 3.5x24 Promus DES), RCA subtotally occluded w/ L->R collats, LCX 30ost.  . Dyslipidemia   . GERD (gastroesophageal reflux disease)   . Hx of [name]   . Hyperlipidemia   . Hypertension   . Inguinal hernia   . Lightheadedness    a. chronic, somewhat positional.  . 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 Major depression   . Multiple myeloma ([place])   . Pleural effusion    left  . Sleep apnea     Past Surgical History:  Procedure Laterality Date  . ANTERIOR APPROACH HEMI HIP ARTHROPLASTY Right 04/16/2016   Procedure: ANTERIOR APPROACH HEMI HIP ARTHROPLASTY;  Surgeon: Hessie Knows, MD;  Location: [facility] ORS;  Service: Orthopedics;  Laterality: Right;  . APPENDECTOMY    . CARDIAC CATHETERIZATION  2002  . CARDIAC CATHETERIZATION  06/2013  . COLONOSCOPY    . FEMUR FRACTURE SURGERY Left    with rod  . HIP FRACTURE SURGERY    . JOINT REPLACEMENT     right partial knee replacement    Family History  Problem Relation Age of Onset  . Heart disease Mother   . Stroke Mother   . Diabetes Father   .  Dementia Father   . Stroke Sister   . Stroke Brother   . Stroke Sister   . Stroke Sister   . Stroke Brother   . Stroke Brother     Social History   Social History  . Marital status: Married    Spouse name: N/A  . Number of children: N/A  . Years of education: N/A   Occupational History  . Not on file.   Social History Main Topics  . Smoking status: Former Smoker    Packs/day: 2.00    Years: 7.00    Types: Cigarettes    Quit date: 09/17/1953  . Smokeless tobacco: Never Used  . Alcohol use No  . Drug use: No  . Sexual activity: Not Currently   Other Topics Concern  . Not on file   Social History Narrative  . No narrative on file     Current Outpatient Prescriptions:  .  acetaminophen (TYLENOL) 500 MG tablet, Take 2 tablets (1,000 mg total) by mouth every 6 (six) hours., Disp: 30 tablet, Rfl: 0 .  clonazePAM (KLONOPIN) 0.5 MG tablet, Take 1 tablet (0.5 mg total) by mouth 3 (three) times daily as needed for anxiety., Disp: 90 tablet, Rfl: 2 .  docusate sodium (COLACE) 100 MG capsule, Take 1  capsule (100 mg total) by mouth 2 (two) times daily., Disp: 10 capsule, Rfl: 0 .  doxazosin (CARDURA) 1 MG tablet, Take 1 tablet (1 mg total) by mouth daily., Disp: 90 tablet, Rfl: 0 .  isosorbide mononitrate (IMDUR) 30 MG 24 hr tablet, Take 1 tablet (30 mg total) by mouth daily., Disp: 90 tablet, Rfl: 1 .  lisinopril (PRINIVIL,ZESTRIL) 2.5 MG tablet, Take 2.5 mg by mouth daily., Disp: , Rfl:  .  metoprolol tartrate (LOPRESSOR) 25 MG tablet, 50 mg po qAM, 25 mg po qHS (Patient taking differently: Take 25-50 mg by mouth 2 (two) times daily. 50 mg po qAM, 25 mg po qHS), Disp: 270 tablet, Rfl: 0 .  Multiple Vitamins-Minerals (PRESERVISION/LUTEIN PO), Take by mouth 2 (two) times daily., Disp: , Rfl:  .  nitroGLYCERIN (NITROSTAT) 0.4 MG SL tablet, Place 0.4 mg under the tongue every 5 (five) minutes as needed for chest pain., Disp: , Rfl:  .  pantoprazole (PROTONIX) 40 MG tablet, Take 1  tablet (40 mg total) by mouth daily., Disp: 90 tablet, Rfl: 0 .  polyethylene glycol (MIRALAX / GLYCOLAX) packet, Take 17 g by mouth daily., Disp: , Rfl:  .  pravastatin (PRAVACHOL) 40 MG tablet, Take 1 tablet (40 mg total) by mouth daily., Disp: 90 tablet, Rfl: 0 .  sertraline (ZOLOFT) 100 MG tablet, Take 1.5 tablets (150 mg total) by mouth at bedtime., Disp: 45 tablet, Rfl: 1 .  traMADol (ULTRAM) 50 MG tablet, Take 50 mg by mouth every 6 (six) hours as needed for moderate pain or severe pain. , Disp: , Rfl:   Allergies  Allergen Reactions  . Sulfamethoxazole-Trimethoprim Nausea Only and Other (See Comments)  . Baclofen   . Nsaids     Avoids due to liver.  . Tolmetin Other (See Comments)    Avoids due to liver.     Review of Systems  Constitutional: Negative for fatigue.  Neurological: Negative for headaches.  Psychiatric/Behavioral: Positive for depression. Negative for suicidal ideas. The patient does not have insomnia.      Objective  Vitals:   10/12/16 1114  BP: 115/71  Pulse: 73  Resp: 16  Temp: (!) 97.4 F (36.3 C)  TempSrc: Oral  SpO2: 96%  Weight: 196 lb 12.8 oz (89.3 kg)  Height: _0  (1.778 m)    Physical Exam  Constitutional: He is oriented to person, place, and time and well-developed, well-nourished, and in no distress.  HENT:  Head: Normocephalic and atraumatic.  Cardiovascular: Normal rate, regular rhythm and normal heart sounds.   No murmur heard. Pulmonary/Chest: Effort normal and breath sounds normal. He has no wheezes.  Neurological: He is alert and oriented to person, place, and time.  Psychiatric: Mood, memory, affect and judgment normal.  Nursing note and vitals reviewed.       Assessment & Plan  1. Recurrent major depressive disorder, in full remission ([place]) Symptoms of depression are responsive to SSRI taken at the present dosage, refills provided - sertraline (ZOLOFT) 100 MG tablet; Take 1.5 tablets (150 mg total) by mouth at  bedtime.  Dispense: 135 tablet; Refill: 0    Arya Luttrull Asad A. [facility] 10/12/2016 11:19 AM

## 2016-10-12 NOTE — Telephone Encounter (Signed)
Tramadol is not being prescribed by [place], he needs to contact the prescribing physician

## 2016-10-12 NOTE — Telephone Encounter (Signed)
As far as tramadol is concerned, it is being prescribed by a specialist and not by this office, please confirm with patient and they should contact the prescribing physician.

## 2016-10-12 NOTE — Telephone Encounter (Signed)
Pts wife is upset and states she was told that our office would give him the medication when he needs it.

## 2016-10-13 ENCOUNTER — Ambulatory Visit: Payer: Medicare Other | Admitting: Family Medicine

## 2016-10-13 NOTE — Telephone Encounter (Signed)
Informed pt .

## 2016-11-05 ENCOUNTER — Telehealth: Payer: Self-pay | Admitting: Family Medicine

## 2016-11-05 DIAGNOSIS — F411 Generalized anxiety disorder: Secondary | ICD-10-CM

## 2016-11-05 DIAGNOSIS — E7800 Pure hypercholesterolemia, unspecified: Secondary | ICD-10-CM

## 2016-11-05 NOTE — Telephone Encounter (Signed)
Requesting prescription refill on clonazepam. Patient will be out before Monday. Also requesting refill for pravastatin please send to [facility]. Please call when complete [phone]

## 2016-11-06 MED ORDER — PRAVASTATIN SODIUM 40 MG PO TABS: 40.0000 mg | ORAL_TABLET | Freq: Every day | ORAL | 0 refills | Status: DC

## 2016-11-06 MED ORDER — CLONAZEPAM 0.5 MG PO TABS: 0.5000 mg | ORAL_TABLET | Freq: Three times a day (TID) | ORAL | 2 refills | Status: DC | PRN

## 2016-11-06 NOTE — Telephone Encounter (Signed)
Prescription for clonazepam is ready for pickup and prescription for pravastatin has been sent to patient's pharmacy

## 2016-11-09 NOTE — Telephone Encounter (Signed)
Left voice mail informing script is ready for pick up

## 2016-11-24 ENCOUNTER — Ambulatory Visit (INDEPENDENT_AMBULATORY_CARE_PROVIDER_SITE_OTHER): Payer: Medicare Other | Admitting: Family Medicine

## 2016-11-24 ENCOUNTER — Encounter: Payer: Self-pay | Admitting: Family Medicine

## 2016-11-24 VITALS — BP 124/84 | HR 84 | Ht 70.0 in | Wt 193.4 lb

## 2016-11-24 DIAGNOSIS — I2510 Atherosclerotic heart disease of native coronary artery without angina pectoris: Secondary | ICD-10-CM | POA: Diagnosis not present

## 2016-11-24 DIAGNOSIS — L089 Local infection of the skin and subcutaneous tissue, unspecified: Secondary | ICD-10-CM | POA: Diagnosis not present

## 2016-11-24 DIAGNOSIS — S81801A Unspecified open wound, right lower leg, initial encounter: Secondary | ICD-10-CM

## 2016-11-24 DIAGNOSIS — N400 Enlarged prostate without lower urinary tract symptoms: Secondary | ICD-10-CM | POA: Diagnosis not present

## 2016-11-24 MED ORDER — DOXAZOSIN MESYLATE 1 MG PO TABS: 1.0000 mg | ORAL_TABLET | Freq: Every day | ORAL | 0 refills | Status: DC

## 2016-11-24 MED ORDER — BACITRACIN 500 UNIT/GM EX OINT: 1.0000 "application " | TOPICAL_OINTMENT | Freq: Two times a day (BID) | CUTANEOUS | 0 refills | Status: DC

## 2016-11-24 NOTE — Progress Notes (Signed)
Name: [name]   MRN: [id number]    DOB: 1924-02-03   Date:11/24/2016       Progress Note  Subjective  Chief Complaint  Chief Complaint  Patient presents with  . Wound Infection    Pt states that he was seen in urgent care recently for leg wound, was given antibiotics and cream     Benign Prostatic Hypertrophy  This is a recurrent problem. The problem is unchanged. Obstructive symptoms do not include dribbling, incomplete emptying, straining or a weak stream. Past treatments include doxazosin.   Patient presents for urgent care follow-up after experiencing right leg wound, this was reportedly because he was leaning on his walker and his right leg was constantly rubbing against it, he developed a wound on the right anterior leg and was seen at the urgent care this past weekend, he was started on cephalex, he has continued taking it and reports his wound is much better today.in   Past Medical History:  Diagnosis Date  . Anxiety   . BPH (benign prostatic hypertrophy)   . Carotid stenosis    a. 05/2015 Carotid U/S: mild nonobs atherosclerosis. No need for f/u.  Marland Kitchen Chronic back pain   . Coronary artery disease    a. 06/2013 Cath/PCI: LAD 50-70p (FFR 0.82-->PCI w/ 3.5x24 Promus DES), RCA subtotally occluded w/ L->R collats, LCX 30ost.  . Dyslipidemia   . GERD (gastroesophageal reflux disease)   . Hx of [name]   . Hyperlipidemia   . Hypertension   . Inguinal hernia   . Lightheadedness    a. chronic, somewhat positional.  . 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 Major depression   . Multiple myeloma ([place])   . Pleural effusion    left  . Sleep apnea     Past Surgical History:  Procedure Laterality Date  . ANTERIOR APPROACH HEMI HIP ARTHROPLASTY Right 04/16/2016   Procedure: ANTERIOR APPROACH HEMI HIP ARTHROPLASTY;  Surgeon: Hessie Knows, MD;  Location: [facility] ORS;  Service: Orthopedics;  Laterality: Right;  . APPENDECTOMY    . CARDIAC CATHETERIZATION  2002  . CARDIAC  CATHETERIZATION  06/2013  . COLONOSCOPY    . FEMUR FRACTURE SURGERY Left    with rod  . HIP FRACTURE SURGERY    . JOINT REPLACEMENT     right partial knee replacement    Family History  Problem Relation Age of Onset  . Heart disease Mother   . Stroke Mother   . Diabetes Father   . Dementia Father   . Stroke Sister   . Stroke Brother   . Stroke Sister   . Stroke Sister   . Stroke Brother   . Stroke Brother     Social History   Social History  . Marital status: Married    Spouse name: N/A  . Number of children: N/A  . Years of education: N/A   Occupational History  . Not on file.   Social History Main Topics  . Smoking status: Former Smoker    Packs/day: 2.00    Years: 7.00    Types: Cigarettes    Quit date: 09/17/1953  . Smokeless tobacco: Never Used  . Alcohol use No  . Drug use: No  . Sexual activity: Not Currently   Other Topics Concern  . Not on file   Social History Narrative  . No narrative on file     Current Outpatient Prescriptions:  .  acetaminophen (TYLENOL) 500 MG tablet, Take 2 tablets (1,000 mg total)  by mouth every 6 (six) hours., Disp: 30 tablet, Rfl: 0 .  acidophilus (RISAQUAD) CAPS capsule, Take by mouth., Disp: , Rfl:  .  cephALEXin (KEFLEX) 500 MG capsule, Take 500 mg by mouth 2 (two) times daily., Disp: , Rfl:  .  clonazePAM (KLONOPIN) 0.5 MG tablet, Take 1 tablet (0.5 mg total) by mouth 3 (three) times daily as needed for anxiety., Disp: 90 tablet, Rfl: 2 .  docusate sodium (COLACE) 100 MG capsule, Take 1 capsule (100 mg total) by mouth 2 (two) times daily., Disp: 10 capsule, Rfl: 0 .  doxazosin (CARDURA) 1 MG tablet, Take 1 tablet (1 mg total) by mouth daily., Disp: 90 tablet, Rfl: 0 .  isosorbide mononitrate (IMDUR) 30 MG 24 hr tablet, Take 1 tablet (30 mg total) by mouth daily., Disp: 90 tablet, Rfl: 1 .  lisinopril (PRINIVIL,ZESTRIL) 2.5 MG tablet, Take 2.5 mg by mouth daily., Disp: , Rfl:  .  metoprolol tartrate (LOPRESSOR) 25 MG  tablet, 50 mg po qAM, 25 mg po qHS (Patient taking differently: Take 25-50 mg by mouth 2 (two) times daily. 50 mg po qAM, 25 mg po qHS), Disp: 270 tablet, Rfl: 0 .  Multiple Vitamins-Minerals (PRESERVISION/LUTEIN PO), Take by mouth 2 (two) times daily., Disp: , Rfl:  .  nitroGLYCERIN (NITROSTAT) 0.4 MG SL tablet, Place 0.4 mg under the tongue every 5 (five) minutes as needed for chest pain., Disp: , Rfl:  .  pantoprazole (PROTONIX) 40 MG tablet, Take 1 tablet (40 mg total) by mouth daily., Disp: 90 tablet, Rfl: 0 .  polyethylene glycol (MIRALAX / GLYCOLAX) packet, Take 17 g by mouth daily., Disp: , Rfl:  .  pravastatin (PRAVACHOL) 40 MG tablet, Take 1 tablet (40 mg total) by mouth daily., Disp: 90 tablet, Rfl: 0 .  sertraline (ZOLOFT) 100 MG tablet, Take 1.5 tablets (150 mg total) by mouth at bedtime., Disp: 135 tablet, Rfl: 0 .  traMADol (ULTRAM) 50 MG tablet, Take 50 mg by mouth every 6 (six) hours as needed for moderate pain or severe pain. , Disp: , Rfl:   Allergies  Allergen Reactions  . Sulfamethoxazole-Trimethoprim Nausea Only and Other (See Comments)  . Baclofen   . Nsaids     Avoids due to liver.  . Tolmetin Other (See Comments)    Avoids due to liver.     Review of Systems  Genitourinary: Negative for incomplete emptying.      Objective  Vitals:   11/24/16 1416  BP: 124/84  Pulse: 84  SpO2: 90%  Weight: 193 lb 6.4 oz (87.7 kg)  Height: '5\' 10"'$  (1.778 m)    Physical Exam  Constitutional: He is oriented to person, place, and time and well-developed, well-nourished, and in no distress.  HENT:  Head: Normocephalic and atraumatic.  Musculoskeletal:       Legs: Healing wound on right anterior lower leg, granulation tissue visible, clear borders, no pus or drainage,   Neurological: He is alert and oriented to person, place, and time.  Psychiatric: Mood, memory, affect and judgment normal.  Nursing note and vitals reviewed.      Recent Results (from the past 2160  hour(s))  Basic metabolic panel     Status: Abnormal   Collection Time: 09/12/16  3:55 PM  Result Value Ref Range   Sodium 137 135 - 145 mmol/L   Potassium 4.7 3.5 - 5.1 mmol/L   Chloride 106 101 - 111 mmol/L   CO2 24 22 - 32 mmol/L   Glucose, Bld 112 (  H) 65 - 99 mg/dL   BUN 32 (H) 6 - 20 mg/dL   Creatinine, Ser 1.55 (H) 0.61 - 1.24 mg/dL   Calcium 9.1 8.9 - 10.3 mg/dL   GFR calc non Af Amer 38 (L) >60 mL/min   GFR calc Af Amer 44 (L) >60 mL/min    Comment: (NOTE) The eGFR has been calculated using the CKD EPI equation. This calculation has not been validated in all clinical situations. eGFR's persistently <60 mL/min signify possible Chronic Kidney Disease.    Anion gap 7 5 - 15  CBC     Status: Abnormal   Collection Time: 09/12/16  3:55 PM  Result Value Ref Range   WBC 12.6 (H) 3.8 - 10.6 K/uL   RBC 3.85 (L) 4.40 - 5.90 MIL/uL   Hemoglobin 10.4 (L) 13.0 - 18.0 g/dL   HCT 31.4 (L) 40.0 - 52.0 %   MCV 81.6 80.0 - 100.0 fL   MCH 27.0 26.0 - 34.0 pg   MCHC 33.1 32.0 - 36.0 g/dL   RDW 17.6 (H) 11.5 - 14.5 %   Platelets 200 150 - 440 K/uL  Urinalysis, Complete w Microscopic     Status: Abnormal   Collection Time: 09/12/16  3:55 PM  Result Value Ref Range   Color, Urine YELLOW (A) YELLOW   APPearance CLEAR (A) CLEAR   Specific Gravity, Urine 1.015 1.005 - 1.030   pH 5.0 5.0 - 8.0   Glucose, UA NEGATIVE NEGATIVE mg/dL   Hgb urine dipstick NEGATIVE NEGATIVE   Bilirubin Urine NEGATIVE NEGATIVE   Ketones, ur NEGATIVE NEGATIVE mg/dL   Protein, ur NEGATIVE NEGATIVE mg/dL   Nitrite NEGATIVE NEGATIVE   Leukocytes, UA NEGATIVE NEGATIVE   RBC / HPF 0-5 0 - 5 RBC/hpf   WBC, UA NONE SEEN 0 - 5 WBC/hpf   Bacteria, UA NONE SEEN NONE SEEN   Squamous Epithelial / LPF NONE SEEN NONE SEEN  Troponin I     Status: None   Collection Time: 09/12/16  3:55 PM  Result Value Ref Range   Troponin I <0.03 <0.03 ng/mL  BASIC METABOLIC PANEL WITH GFR     Status: Abnormal   Collection Time:  09/14/16 12:44 PM  Result Value Ref Range   Sodium 134 (L) 135 - 146 mmol/L   Potassium 4.9 3.5 - 5.3 mmol/L   Chloride 99 98 - 110 mmol/L   CO2 23 20 - 31 mmol/L   Glucose, Bld 80 65 - 99 mg/dL   BUN 32 (H) 7 - 25 mg/dL   Creat 1.38 (H) 0.70 - 1.11 mg/dL    Comment:   For patients > or = 80 years of age: The upper reference limit for Creatinine is approximately 13% higher for people identified as African-American.      Calcium 8.9 8.6 - 10.3 mg/dL   GFR, Est African American 52 (L) >=60 mL/min   GFR, Est Non African American 45 (L) >=60 mL/min  CBC with Differential/Platelet     Status: Abnormal   Collection Time: 09/14/16 12:44 PM  Result Value Ref Range   WBC 9.9 3.8 - 10.8 K/uL   RBC 3.76 (L) 4.20 - 5.80 MIL/uL   Hemoglobin 10.3 (L) 13.2 - 17.1 g/dL   HCT 31.4 (L) 38.5 - 50.0 %   MCV 83.5 80.0 - 100.0 fL   MCH 27.4 27.0 - 33.0 pg   MCHC 32.8 32.0 - 36.0 g/dL   RDW 17.4 (H) 11.0 - 15.0 %   Platelets 229  140 - 400 K/uL   MPV 10.5 7.5 - 12.5 fL   Neutro Abs 7,029 1,500 - 7,800 cells/uL   Lymphs Abs 1,782 850 - 3,900 cells/uL   Monocytes Absolute 990 (H) 200 - 950 cells/uL   Eosinophils Absolute 99 15 - 500 cells/uL   Basophils Absolute 0 0 - 200 cells/uL   Neutrophils Relative % 71 %   Lymphocytes Relative 18 %   Monocytes Relative 10 %   Eosinophils Relative 1 %   Basophils Relative 0 %   Smear Review Criteria for review not met      Assessment & Plan  1. Traumatic open wound of right lower leg with infection, initial encounter Advised to continue taking cephalexin and finished a course. We will prescribe bacitracin ointment for topical application to expedite healing - bacitracin 500 UNIT/GM ointment; Apply 1 application topically 2 (two) times daily.  Dispense: 15 g; Refill: 0  2. BPH without obstruction/lower urinary tract symptoms - doxazosin (CARDURA) 1 MG tablet; Take 1 tablet (1 mg total) by mouth daily.  Dispense: 90 tablet; Refill: 0  Cleon Signorelli Asad A.  [facility] 11/24/2016 2:30 PM

## 2016-11-30 ENCOUNTER — Encounter: Payer: Self-pay | Admitting: Family Medicine

## 2016-11-30 ENCOUNTER — Ambulatory Visit (INDEPENDENT_AMBULATORY_CARE_PROVIDER_SITE_OTHER): Payer: Medicare Other | Admitting: Family Medicine

## 2016-11-30 ENCOUNTER — Ambulatory Visit: Payer: Medicare Other

## 2016-11-30 VITALS — BP 108/74 | HR 92 | Temp 97.4°F | Resp 16 | Ht 70.0 in | Wt 194.9 lb

## 2016-11-30 DIAGNOSIS — I2510 Atherosclerotic heart disease of native coronary artery without angina pectoris: Secondary | ICD-10-CM | POA: Diagnosis not present

## 2016-11-30 DIAGNOSIS — M62830 Muscle spasm of back: Secondary | ICD-10-CM

## 2016-11-30 DIAGNOSIS — I10 Essential (primary) hypertension: Secondary | ICD-10-CM | POA: Diagnosis not present

## 2016-11-30 MED ORDER — TIZANIDINE HCL 2 MG PO TABS: 2.0000 mg | ORAL_TABLET | Freq: Three times a day (TID) | ORAL | 0 refills | Status: DC | PRN

## 2016-11-30 NOTE — Progress Notes (Signed)
Name: [name]   MRN: [id number]    DOB: [birth year]   Date:11/30/2016       Progress Note  Subjective  Chief Complaint  Chief Complaint  Patient presents with  . Follow-up    Right leg infection   . Hypertension    Blood pressure is going down.    Hypertension  This is a recurrent problem. The problem is unchanged (some nights, his DBP is in 50s or 60s). The problem is controlled. Pertinent negatives include no blurred vision, chest pain, headaches or palpitations. Past treatments include beta blockers, ACE inhibitors and alpha 1 blockers. Hypertensive end-organ damage includes [name]/MI. There is no history of kidney disease or CVA.   Patient has been complaining of low back stiffness for 3 weeks after falling backwards while turning around, landed on the carpet on his back. He has history of chronic low back pain which has been stable lately but now he is experiencing muscle stiffness especially with certain motions.    Past Medical History:  Diagnosis Date  . Anxiety   . BPH (benign prostatic hypertrophy)   . Carotid stenosis    a. 05/2015 Carotid U/S: mild nonobs atherosclerosis. No need for f/u.  Marland Kitchen Chronic back pain   . Coronary artery disease    a. 06/2013 Cath/PCI: LAD 50-70p (FFR 0.82-->PCI w/ 3.5x24 Promus DES), RCA subtotally occluded w/ L->R collats, LCX 30ost.  . Dyslipidemia   . GERD (gastroesophageal reflux disease)   . Hx of [name]   . Hyperlipidemia   . Hypertension   . Inguinal hernia   . Lightheadedness    a. chronic, somewhat positional.  . 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 Major depression   . Multiple myeloma ([place])   . Pleural effusion    left  . Sleep apnea     Past Surgical History:  Procedure Laterality Date  . ANTERIOR APPROACH HEMI HIP ARTHROPLASTY Right 04/16/2016   Procedure: ANTERIOR APPROACH HEMI HIP ARTHROPLASTY;  Surgeon: Hessie Knows, MD;  Location: [facility] ORS;  Service: Orthopedics;  Laterality: Right;  . APPENDECTOMY    . CARDIAC  CATHETERIZATION  2002  . CARDIAC CATHETERIZATION  06/2013  . COLONOSCOPY    . FEMUR FRACTURE SURGERY Left    with rod  . HIP FRACTURE SURGERY    . JOINT REPLACEMENT     right partial knee replacement    Family History  Problem Relation Age of Onset  . Heart disease Mother   . Stroke Mother   . Diabetes Father   . Dementia Father   . Stroke Sister   . Stroke Brother   . Stroke Sister   . Stroke Sister   . Stroke Brother   . Stroke Brother     Social History   Social History  . Marital status: Married    Spouse name: N/A  . Number of children: N/A  . Years of education: N/A   Occupational History  . Not on file.   Social History Main Topics  . Smoking status: Former Smoker    Packs/day: 2.00    Years: 7.00    Types: Cigarettes    Quit date: 09/17/1953  . Smokeless tobacco: Never Used  . Alcohol use No  . Drug use: No  . Sexual activity: Not Currently   Other Topics Concern  . Not on file   Social History Narrative  . No narrative on file     Current Outpatient Prescriptions:  .  acetaminophen (TYLENOL) 500 MG  tablet, Take 2 tablets (1,000 mg total) by mouth every 6 (six) hours., Disp: 30 tablet, Rfl: 0 .  acidophilus (RISAQUAD) CAPS capsule, Take by mouth., Disp: , Rfl:  .  bacitracin 500 UNIT/GM ointment, Apply 1 application topically 2 (two) times daily., Disp: 15 g, Rfl: 0 .  clonazePAM (KLONOPIN) 0.5 MG tablet, Take 1 tablet (0.5 mg total) by mouth 3 (three) times daily as needed for anxiety., Disp: 90 tablet, Rfl: 2 .  docusate sodium (COLACE) 100 MG capsule, Take 1 capsule (100 mg total) by mouth 2 (two) times daily., Disp: 10 capsule, Rfl: 0 .  doxazosin (CARDURA) 1 MG tablet, Take 1 tablet (1 mg total) by mouth daily., Disp: 90 tablet, Rfl: 0 .  isosorbide mononitrate (IMDUR) 30 MG 24 hr tablet, Take 1 tablet (30 mg total) by mouth daily., Disp: 90 tablet, Rfl: 1 .  metoprolol tartrate (LOPRESSOR) 25 MG tablet, 50 mg po qAM, 25 mg po qHS (Patient  taking differently: Take 25-50 mg by mouth 2 (two) times daily. 50 mg po qAM, 25 mg po qHS), Disp: 270 tablet, Rfl: 0 .  Multiple Vitamins-Minerals (PRESERVISION/LUTEIN PO), Take by mouth 2 (two) times daily., Disp: , Rfl:  .  pantoprazole (PROTONIX) 40 MG tablet, Take 1 tablet (40 mg total) by mouth daily., Disp: 90 tablet, Rfl: 0 .  polyethylene glycol (MIRALAX / GLYCOLAX) packet, Take 17 g by mouth daily., Disp: , Rfl:  .  pravastatin (PRAVACHOL) 40 MG tablet, Take 1 tablet (40 mg total) by mouth daily., Disp: 90 tablet, Rfl: 0 .  sertraline (ZOLOFT) 100 MG tablet, Take 1.5 tablets (150 mg total) by mouth at bedtime., Disp: 135 tablet, Rfl: 0 .  traMADol (ULTRAM) 50 MG tablet, Take 50 mg by mouth every 6 (six) hours as needed for moderate pain or severe pain. , Disp: , Rfl:  .  cephALEXin (KEFLEX) 500 MG capsule, Take 500 mg by mouth 2 (two) times daily., Disp: , Rfl:  .  lisinopril (PRINIVIL,ZESTRIL) 2.5 MG tablet, Take 2.5 mg by mouth daily., Disp: , Rfl:  .  nitroGLYCERIN (NITROSTAT) 0.4 MG SL tablet, Place 0.4 mg under the tongue every 5 (five) minutes as needed for chest pain., Disp: , Rfl:   Allergies  Allergen Reactions  . Sulfamethoxazole-Trimethoprim Nausea Only and Other (See Comments)  . Baclofen   . Nsaids     Avoids due to liver.  . Tolmetin Other (See Comments)    Avoids due to liver.     Review of Systems  Eyes: Negative for blurred vision.  Cardiovascular: Negative for chest pain and palpitations.  Neurological: Negative for headaches.     Objective  Vitals:   11/30/16 0948 11/30/16 0950  BP: 110/76 108/74  Pulse: 92   Resp: 16   Temp: (!) 97.4 F (36.3 C)   TempSrc: Oral   SpO2: 96%   Weight: 194 lb 14.4 oz (88.4 kg)   Height: 5\' 10"  (1.778 m)     Physical Exam  Constitutional: He is oriented to person, place, and time and well-developed, well-nourished, and in no distress.  Cardiovascular: Normal rate, regular rhythm and normal heart sounds.   No  murmur heard. Pulmonary/Chest: Effort normal and breath sounds normal. He has no wheezes.  Abdominal: Soft. Bowel sounds are normal. There is no tenderness.  Musculoskeletal:       Thoracic back: He exhibits pain and spasm.       Back:  Neurological: He is alert and oriented to person, place, and  time.  Nursing note and vitals reviewed.     Recent Results (from the past 2160 hour(s))  Basic metabolic panel     Status: Abnormal   Collection Time: 09/12/16  3:55 PM  Result Value Ref Range   Sodium 137 135 - 145 mmol/L   Potassium 4.7 3.5 - 5.1 mmol/L   Chloride 106 101 - 111 mmol/L   CO2 24 22 - 32 mmol/L   Glucose, Bld 112 (H) 65 - 99 mg/dL   BUN 32 (H) 6 - 20 mg/dL   Creatinine, Ser 1.55 (H) 0.61 - 1.24 mg/dL   Calcium 9.1 8.9 - 10.3 mg/dL   GFR calc non Af Amer 38 (L) >60 mL/min   GFR calc Af Amer 44 (L) >60 mL/min    Comment: (NOTE) The eGFR has been calculated using the CKD EPI equation. This calculation has not been validated in all clinical situations. eGFR's persistently <60 mL/min signify possible Chronic Kidney Disease.    Anion gap 7 5 - 15  CBC     Status: Abnormal   Collection Time: 09/12/16  3:55 PM  Result Value Ref Range   WBC 12.6 (H) 3.8 - 10.6 K/uL   RBC 3.85 (L) 4.40 - 5.90 MIL/uL   Hemoglobin 10.4 (L) 13.0 - 18.0 g/dL   HCT 31.4 (L) 40.0 - 52.0 %   MCV 81.6 80.0 - 100.0 fL   MCH 27.0 26.0 - 34.0 pg   MCHC 33.1 32.0 - 36.0 g/dL   RDW 17.6 (H) 11.5 - 14.5 %   Platelets 200 150 - 440 K/uL  Urinalysis, Complete w Microscopic     Status: Abnormal   Collection Time: 09/12/16  3:55 PM  Result Value Ref Range   Color, Urine YELLOW (A) YELLOW   APPearance CLEAR (A) CLEAR   Specific Gravity, Urine 1.015 1.005 - 1.030   pH 5.0 5.0 - 8.0   Glucose, UA NEGATIVE NEGATIVE mg/dL   Hgb urine dipstick NEGATIVE NEGATIVE   Bilirubin Urine NEGATIVE NEGATIVE   Ketones, ur NEGATIVE NEGATIVE mg/dL   Protein, ur NEGATIVE NEGATIVE mg/dL   Nitrite NEGATIVE NEGATIVE     Leukocytes, UA NEGATIVE NEGATIVE   RBC / HPF 0-5 0 - 5 RBC/hpf   WBC, UA NONE SEEN 0 - 5 WBC/hpf   Bacteria, UA NONE SEEN NONE SEEN   Squamous Epithelial / LPF NONE SEEN NONE SEEN  Troponin I     Status: None   Collection Time: 09/12/16  3:55 PM  Result Value Ref Range   Troponin I <0.03 <0.03 ng/mL  BASIC METABOLIC PANEL WITH GFR     Status: Abnormal   Collection Time: 09/14/16 12:44 PM  Result Value Ref Range   Sodium 134 (L) 135 - 146 mmol/L   Potassium 4.9 3.5 - 5.3 mmol/L   Chloride 99 98 - 110 mmol/L   CO2 23 20 - 31 mmol/L   Glucose, Bld 80 65 - 99 mg/dL   BUN 32 (H) 7 - 25 mg/dL   Creat 1.38 (H) 0.70 - 1.11 mg/dL    Comment:   For patients > or = 81 years of age: The upper reference limit for Creatinine is approximately 13% higher for people identified as African-American.      Calcium 8.9 8.6 - 10.3 mg/dL   GFR, Est African American 52 (L) >=60 mL/min   GFR, Est Non African American 45 (L) >=60 mL/min  CBC with Differential/Platelet     Status: Abnormal   Collection Time: 09/14/16  12:44 PM  Result Value Ref Range   WBC 9.9 3.8 - 10.8 K/uL   RBC 3.76 (L) 4.20 - 5.80 MIL/uL   Hemoglobin 10.3 (L) 13.2 - 17.1 g/dL   HCT 31.4 (L) 38.5 - 50.0 %   MCV 83.5 80.0 - 100.0 fL   MCH 27.4 27.0 - 33.0 pg   MCHC 32.8 32.0 - 36.0 g/dL   RDW 17.4 (H) 11.0 - 15.0 %   Platelets 229 140 - 400 K/uL   MPV 10.5 7.5 - 12.5 fL   Neutro Abs 7,029 1,500 - 7,800 cells/uL   Lymphs Abs 1,782 850 - 3,900 cells/uL   Monocytes Absolute 990 (H) 200 - 950 cells/uL   Eosinophils Absolute 99 15 - 500 cells/uL   Basophils Absolute 0 0 - 200 cells/uL   Neutrophils Relative % 71 %   Lymphocytes Relative 18 %   Monocytes Relative 10 %   Eosinophils Relative 1 %   Basophils Relative 0 %   Smear Review Criteria for review not met      Assessment & Plan  1. Essential hypertension Pressures stable and at goal today, no change in pharmacotherapy, advised patient to stay hydrated  2. Back  muscle spasm Back stiffness associated with recent fall, has history of lower back facet disease. Start on tizanidine for relief of muscle spasm - tiZANidine (ZANAFLEX) 2 MG tablet; Take 1 tablet (2 mg total) by mouth every 8 (eight) hours as needed for muscle spasms.  Dispense: 21 tablet; Refill: 0   Kadi Hession Asad A. [facility] 11/30/2016 9:58 AM

## 2016-12-04 ENCOUNTER — Emergency Department: Payer: Medicare Other

## 2016-12-04 ENCOUNTER — Emergency Department
Admission: EM | Admit: 2016-12-04 | Discharge: 2016-12-04 | Disposition: A | Discharge status: Home / Self Care | Payer: Medicare Other | Attending: Emergency Medicine | Admitting: Emergency Medicine

## 2016-12-04 ENCOUNTER — Encounter: Payer: Self-pay | Admitting: Emergency Medicine

## 2016-12-04 DIAGNOSIS — Z87891 Personal history of nicotine dependence: Secondary | ICD-10-CM | POA: Insufficient documentation

## 2016-12-04 DIAGNOSIS — G8929 Other chronic pain: Secondary | ICD-10-CM | POA: Insufficient documentation

## 2016-12-04 DIAGNOSIS — I2510 Atherosclerotic heart disease of native coronary artery without angina pectoris: Secondary | ICD-10-CM | POA: Diagnosis not present

## 2016-12-04 DIAGNOSIS — I10 Essential (primary) hypertension: Secondary | ICD-10-CM | POA: Insufficient documentation

## 2016-12-04 DIAGNOSIS — M546 Pain in thoracic spine: Secondary | ICD-10-CM | POA: Insufficient documentation

## 2016-12-04 DIAGNOSIS — Z79899 Other long term (current) drug therapy: Secondary | ICD-10-CM | POA: Diagnosis not present

## 2016-12-04 DIAGNOSIS — M549 Dorsalgia, unspecified: Secondary | ICD-10-CM | POA: Diagnosis present

## 2016-12-04 MED ORDER — CYCLOBENZAPRINE HCL 10 MG PO TABS: 10.0000 mg | ORAL_TABLET | Freq: Three times a day (TID) | ORAL | 0 refills | Status: DC | PRN

## 2016-12-04 MED ORDER — ORPHENADRINE CITRATE 30 MG/ML IJ SOLN
30.0000 mg | Freq: Two times a day (BID) | INTRAMUSCULAR | Status: DC
  Administered 2016-12-04: 30 mg via INTRAMUSCULAR
  Filled 2016-12-04: qty 2

## 2016-12-04 NOTE — ED Notes (Addendum)
Patient stated back pain much less than it was on arrival 4/10 currently, patient in no apparent distress or discomfort

## 2016-12-04 NOTE — ED Notes (Signed)
Back right side lower thoracic area reddened, bilaterally muscle feels tight.

## 2016-12-04 NOTE — ED Provider Notes (Signed)
[facility] Emergency Department Provider Note  ____________________________________________  Time seen: Approximately 3:18 PM  I have reviewed the triage vital signs and the nursing notes.   HISTORY  Chief Complaint Back Pain and Fall    HPI [name] is a 83 y.o. male presents to the emergency department with 10/10 low back and upper back pain after a fall 2 weeks ago. Patient reports that he has a history of chronic low back pain and has been taking tramadol and Tylenol. Patient ambulates at home with a walker. Patient denies weakness or changes in sensation in the upper or lower extremities. He denies radiculopathy. Patient is accompanied by his wife who does not look patient to have any form of narcotic pain medication as she is apprehensive about patient falling. He denies chest pain, chest tightness, shortness of breath, nausea, vomiting or abdominal pain. Patient sustained no skin compromise during fall.   Past Medical History:  Diagnosis Date  . Anxiety   . BPH (benign prostatic hypertrophy)   . Carotid stenosis    a. 05/2015 Carotid U/S: mild nonobs atherosclerosis. No need for f/u.  Marland Kitchen Chronic back pain   . Coronary artery disease    a. 06/2013 Cath/PCI: LAD 50-70p (FFR 0.82-->PCI w/ 3.5x24 Promus DES), RCA subtotally occluded w/ L->R collats, LCX 30ost.  . Dyslipidemia   . GERD (gastroesophageal reflux disease)   . Hx of [name]   . Hyperlipidemia   . Hypertension   . Inguinal hernia   . Lightheadedness    a. chronic, somewhat positional.  . 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 Major depression   . Multiple myeloma ([place])   . Pleural effusion    left  . Sleep apnea     Patient Active Problem List   Diagnosis Date Noted  . Acute kidney injury ([place]) 09/14/2016  . Head injury, intracranial, without loss of consciousness or fracture ([place]) 09/07/2016  . Groin pain, left 06/15/2016  . Closed right hip fracture, with routine healing,  subsequent encounter 04/15/2016  . Productive cough 03/23/2016  . Medicare annual wellness visit, subsequent 02/19/2016  . Major depression 10/14/2015  . Behavior disturbance 09/12/2015  . Mobility impaired 08/27/2015  . Skin ulcer of back ([place]) 07/01/2015  . Laceration of right hand 07/01/2015  . Traumatic hematoma of lower back 07/01/2015  . Encounters for administrative purpose 04/01/2015  . Skin lesion of face 12/24/2014  . Contusion of right hand 11/28/2014  . Herpes zoster 10/30/2014  . Need for immunization against influenza 10/24/2014  . Compression fracture of L4 lumbar vertebra ([facility]) 09/28/2014  . Rectal or anal pain 08/13/2014  . Anxiety disorder 07/23/2014  . BPH without obstruction/lower urinary tract symptoms 07/23/2014  . Atherosclerosis of coronary artery 07/23/2014  . Chronic LBP 07/23/2014  . Hypercholesteremia 07/23/2014  . Benign hypertension 07/23/2014  . Acid reflux 07/23/2014  . Chronic recurrent major depressive disorder ([place]) 07/23/2014  . GERD (gastroesophageal reflux disease) 07/23/2014  . Coronary artery disease involving native coronary artery without angina pectoris 07/06/2014  . Essential hypertension 07/06/2014  . Carotid stenosis 07/06/2014  . Hyperlipidemia   . H/O [name] 12/20/2011    Past Surgical History:  Procedure Laterality Date  . ANTERIOR APPROACH HEMI HIP ARTHROPLASTY Right 04/16/2016   Procedure: ANTERIOR APPROACH HEMI HIP ARTHROPLASTY;  Surgeon: Hessie Knows, MD;  Location: [facility] ORS;  Service: Orthopedics;  Laterality: Right;  . APPENDECTOMY    . CARDIAC CATHETERIZATION  2002  . CARDIAC CATHETERIZATION  06/2013  .  COLONOSCOPY    . FEMUR FRACTURE SURGERY Left    with rod  . HIP FRACTURE SURGERY    . JOINT REPLACEMENT     right partial knee replacement    Prior to Admission medications   Medication Sig Start Date End Date Taking? Authorizing Provider  acetaminophen (TYLENOL) 500 MG tablet Take 2 tablets (1,000 mg total) by  mouth every 6 (six) hours. 04/17/16   Dustin Flock, MD  acidophilus (RISAQUAD) CAPS capsule Take by mouth.    [provider]  bacitracin 500 UNIT/GM ointment Apply 1 application topically 2 (two) times daily. 11/24/16   Roselee Nova, MD  cephALEXin (KEFLEX) 500 MG capsule Take 500 mg by mouth 2 (two) times daily.    [provider]  clonazePAM (KLONOPIN) 0.5 MG tablet Take 1 tablet (0.5 mg total) by mouth 3 (three) times daily as needed for anxiety. 11/06/16   Roselee Nova, MD  cyclobenzaprine (FLEXERIL) 10 MG tablet Take 1 tablet (10 mg total) by mouth 3 (three) times daily as needed. 12/04/16 12/09/16  Lannie Fields, PA-C  docusate sodium (COLACE) 100 MG capsule Take 1 capsule (100 mg total) by mouth 2 (two) times daily. 04/17/16   Dustin Flock, MD  doxazosin (CARDURA) 1 MG tablet Take 1 tablet (1 mg total) by mouth daily. 11/24/16   Roselee Nova, MD  isosorbide mononitrate (IMDUR) 30 MG 24 hr tablet Take 1 tablet (30 mg total) by mouth daily. 09/07/16   Roselee Nova, MD  lisinopril (PRINIVIL,ZESTRIL) 2.5 MG tablet Take 2.5 mg by mouth daily.    [provider]  metoprolol tartrate (LOPRESSOR) 25 MG tablet 50 mg po qAM, 25 mg po qHS Patient taking differently: Take 25-50 mg by mouth 2 (two) times daily. 50 mg po qAM, 25 mg po qHS 08/31/16   Roselee Nova, MD  Multiple Vitamins-Minerals (PRESERVISION/LUTEIN PO) Take by mouth 2 (two) times daily.    [provider]  nitroGLYCERIN (NITROSTAT) 0.4 MG SL tablet Place 0.4 mg under the tongue every 5 (five) minutes as needed for chest pain.    [provider]  pantoprazole (PROTONIX) 40 MG tablet Take 1 tablet (40 mg total) by mouth daily. 08/31/16   Roselee Nova, MD  polyethylene glycol [facility] / Floria Raveling) packet Take 17 g by mouth daily.    [provider]  pravastatin (PRAVACHOL) 40 MG tablet Take 1 tablet (40 mg total) by mouth daily. 11/06/16   Roselee Nova, MD   sertraline (ZOLOFT) 100 MG tablet Take 1.5 tablets (150 mg total) by mouth at bedtime. 10/12/16   Roselee Nova, MD  tiZANidine (ZANAFLEX) 2 MG tablet Take 1 tablet (2 mg total) by mouth every 8 (eight) hours as needed for muscle spasms. 11/30/16 12/07/16  Roselee Nova, MD  traMADol (ULTRAM) 50 MG tablet Take 50 mg by mouth every 6 (six) hours as needed for moderate pain or severe pain.  07/10/16   [provider]    Allergies Sulfamethoxazole-trimethoprim; Baclofen; Nsaids; and Tolmetin  Family History  Problem Relation Age of Onset  . Heart disease Mother   . Stroke Mother   . Diabetes Father   . Dementia Father   . Stroke Sister   . Stroke Brother   . Stroke Sister   . Stroke Sister   . Stroke Brother   . Stroke Brother     Social History Social History  Substance Use  Topics  . Smoking status: Former Smoker    Packs/day: 2.00    Years: 7.00    Types: Cigarettes    Quit date: 09/17/1953  . Smokeless tobacco: Never Used  . Alcohol use No     Review of Systems  Constitutional: No fever/chills Eyes: No visual changes. No discharge [name]: No upper respiratory complaints. Cardiovascular: no chest pain. Respiratory: no cough. No SOB. Gastrointestinal: No abdominal pain.  No nausea, no vomiting.  No diarrhea.  No constipation. Musculoskeletal: Patient has low back pain and thoracic back pain.  Skin: Negative for rash, abrasions, lacerations, ecchymosis. Neurological: Negative for headaches, focal weakness or numbness.  ____________________________________________   PHYSICAL EXAM:  VITAL SIGNS: ED Triage Vitals  Enc Vitals Group     BP 12/04/16 1344 130/61     Pulse Rate 12/04/16 1344 72     Resp 12/04/16 1344 20     Temp 12/04/16 1344 98.2 F (36.8 C)     Temp Source 12/04/16 1344 Oral     SpO2 12/04/16 1344 94 %     Weight 12/04/16 1344 196 lb (88.9 kg)     Height 12/04/16 1344 '5\' 10"'$  (1.778 m)     Head Circumference --      Peak Flow --       Pain Score 12/04/16 1343 8     Pain Loc --      Pain Edu? --      Excl. in [place]? --      Constitutional: Alert and oriented. Well appearing and in no acute distress. Eyes: Conjunctivae are normal. PERRL. EOMI. Head: Atraumatic. Cardiovascular: Normal rate, regular rhythm. Normal S1 and S2.  Good peripheral circulation. Respiratory: Normal respiratory effort without tachypnea or retractions. Lungs CTAB. Good air entry to the bases with no decreased or absent breath sounds. Musculoskeletal:Patient has 5/5 strength in the upper and lower extremities bilaterally. Full range of motion at the shoulder, elbow and wrist bilaterally. Full range of motion at the hip, knee and ankle bilaterally. No changes in gait. Patient has paraspinal muscle tenderness along the thoracic spine. Neurologic:  Normal speech and language. No gross focal neurologic deficits are appreciated.  Skin:  Skin is warm, dry and intact. No rash noted. Psychiatric: Mood and affect are normal. Speech and behavior are normal. Patient exhibits appropriate insight and judgement.   ____________________________________________   LABS (all labs ordered are listed, but only abnormal results are displayed)  Labs Reviewed - No data to display ____________________________________________  EKG   ____________________________________________  RADIOLOGY Unk Pinto, personally viewed and evaluated these images (plain radiographs) as part of my medical decision making, as well as reviewing the written report by the radiologist.    Dg Thoracic Spine 2 View  Result Date: 12/04/2016 CLINICAL DATA:  [place] 2 weeks ago.  Back pain. EXAM: THORACIC SPINE 2 VIEWS; LUMBAR SPINE - COMPLETE 4+ VIEW COMPARISON:  Lumbar spine radiographs September 06, 2014 and MRI of the lumbar spine September 26, 2014 FINDINGS: Thoracic spine: Thoracic vertebral bodies intact and aligned with maintenance of thoracic kyphosis. Intervertebral disc heights preserved,  multilevel ventral bridging osteophytes. No destructive bony lesions. Osteopenia. Prevertebral and paraspinal soft tissue planes are non-suspicious. Calcified aortic arch. Lumbar spine: Mild-to-moderate old, similar L4 compression fracture. Remaining lumbar vertebral bodies intact. Mild L5-S1 disc height loss with multilevel endplate spurring compatible with degenerative discs. Moderate lower lumbar facet arthropathy. Osteopenia without destructive bony lesions. Status post bilateral total hip replacements. Aortoiliac atherosclerosis. Sacroiliac joints are symmetric.  IMPRESSION: Thoracic spine: Degenerative change without fracture deformity or malalignment. Lumbar spine: Old mild-to-moderate L4 compression fracture. No acute fracture deformity or malalignment. Stable degenerative change. Aortic Atherosclerosis (ICD10-I70.0). Electronically Signed   By: Elon Alas M.D.   On: 12/04/2016 16:16   Dg Lumbar Spine Complete  Result Date: 12/04/2016 CLINICAL DATA:  [place] 2 weeks ago.  Back pain. EXAM: THORACIC SPINE 2 VIEWS; LUMBAR SPINE - COMPLETE 4+ VIEW COMPARISON:  Lumbar spine radiographs September 06, 2014 and MRI of the lumbar spine September 26, 2014 FINDINGS: Thoracic spine: Thoracic vertebral bodies intact and aligned with maintenance of thoracic kyphosis. Intervertebral disc heights preserved, multilevel ventral bridging osteophytes. No destructive bony lesions. Osteopenia. Prevertebral and paraspinal soft tissue planes are non-suspicious. Calcified aortic arch. Lumbar spine: Mild-to-moderate old, similar L4 compression fracture. Remaining lumbar vertebral bodies intact. Mild L5-S1 disc height loss with multilevel endplate spurring compatible with degenerative discs. Moderate lower lumbar facet arthropathy. Osteopenia without destructive bony lesions. Status post bilateral total hip replacements. Aortoiliac atherosclerosis. Sacroiliac joints are symmetric. IMPRESSION: Thoracic spine: Degenerative change  without fracture deformity or malalignment. Lumbar spine: Old mild-to-moderate L4 compression fracture. No acute fracture deformity or malalignment. Stable degenerative change. Aortic Atherosclerosis (ICD10-I70.0). Electronically Signed   By: Elon Alas M.D.   On: 12/04/2016 16:16    ____________________________________________    PROCEDURES  Procedure(s) performed:    Procedures    Medications  orphenadrine (NORFLEX) injection 30 mg (30 mg Intramuscular Given 12/04/16 1631)     ____________________________________________   INITIAL IMPRESSION / ASSESSMENT AND PLAN / ED COURSE  Pertinent labs & imaging results that were available during my care of the patient were reviewed by me and considered in my medical decision making (see chart for details).  Review of the [place] CSRS was performed in accordance of the [place] prior to dispensing any controlled drugs.    Assessment and Plan: Back Pain  Low back pain differential diagnosis includes muscle spasm versus compression fracture versus herniated disc versus sciatica. Patient localizes his pain to the upper and lower back. Patient reported that his pain was relieved with Norflex administered in the emergency department, increasing suspicion for muscle spasm. Patient had no midline spinal tenderness on physical exam and no acute compression fractures were visualized on x-rays conducted in the emergency department. Patient declined additional pain medications aside from muscle relaxers due to concern for falls. Patient was referred back to primary care and advised that if muscle relaxers fail, a consultation with  pain management might be conducive to long term symptomatic relief. Patient's wife and granddaughter voiced understanding regarding this recommendation. Patient was discharged with Flexeril and advised to discontinue tizanidine.    ____________________________________________  FINAL CLINICAL IMPRESSION(S) / ED  DIAGNOSES  Final diagnoses:  Chronic bilateral thoracic back pain      NEW MEDICATIONS STARTED DURING THIS VISIT:  New Prescriptions   CYCLOBENZAPRINE (FLEXERIL) 10 MG TABLET    Take 1 tablet (10 mg total) by mouth 3 (three) times daily as needed.        This chart was dictated using voice recognition software/Dragon. Despite best efforts to proofread, errors can occur which can change the meaning. Any change was purely unintentional.    Lannie Fields, PA-C 12/04/16 1738    Lavonia Drafts, MD 12/04/16 [phone]

## 2016-12-04 NOTE — ED Triage Notes (Signed)
Pt reports tripped and fell two weeks ago onto back. Pt reports increasing low and mid back pain since the fall. Pt reports it feels like a grabbing sensation. Pt reports when he is sitting still he has no pain but if he moves it hurts and feels the grabbing sensation. Pt states has been taking muscle relaxers that his PCP gave him without relief. Pt denies having had any imaging done.

## 2016-12-05 ENCOUNTER — Emergency Department: Payer: Medicare Other

## 2016-12-05 ENCOUNTER — Emergency Department
Admission: EM | Admit: 2016-12-05 | Discharge: 2016-12-05 | Disposition: A | Discharge status: Home / Self Care | Payer: Medicare Other | Attending: Emergency Medicine | Admitting: Emergency Medicine

## 2016-12-05 DIAGNOSIS — F329 Major depressive disorder, single episode, unspecified: Secondary | ICD-10-CM | POA: Diagnosis not present

## 2016-12-05 DIAGNOSIS — W19XXXA Unspecified fall, initial encounter: Secondary | ICD-10-CM

## 2016-12-05 DIAGNOSIS — Z87891 Personal history of nicotine dependence: Secondary | ICD-10-CM | POA: Diagnosis not present

## 2016-12-05 DIAGNOSIS — Z96641 Presence of right artificial hip joint: Secondary | ICD-10-CM | POA: Diagnosis not present

## 2016-12-05 DIAGNOSIS — I2510 Atherosclerotic heart disease of native coronary artery without angina pectoris: Secondary | ICD-10-CM | POA: Diagnosis not present

## 2016-12-05 DIAGNOSIS — S0101XA Laceration without foreign body of scalp, initial encounter: Secondary | ICD-10-CM | POA: Insufficient documentation

## 2016-12-05 DIAGNOSIS — I10 Essential (primary) hypertension: Secondary | ICD-10-CM | POA: Insufficient documentation

## 2016-12-05 DIAGNOSIS — F419 Anxiety disorder, unspecified: Secondary | ICD-10-CM | POA: Insufficient documentation

## 2016-12-05 DIAGNOSIS — Y92129 Unspecified place in nursing home as the place of occurrence of the external cause: Secondary | ICD-10-CM | POA: Insufficient documentation

## 2016-12-05 DIAGNOSIS — Y998 Other external cause status: Secondary | ICD-10-CM | POA: Insufficient documentation

## 2016-12-05 DIAGNOSIS — L03115 Cellulitis of right lower limb: Secondary | ICD-10-CM | POA: Diagnosis not present

## 2016-12-05 DIAGNOSIS — Y9389 Activity, other specified: Secondary | ICD-10-CM | POA: Insufficient documentation

## 2016-12-05 DIAGNOSIS — Z96651 Presence of right artificial knee joint: Secondary | ICD-10-CM | POA: Insufficient documentation

## 2016-12-05 DIAGNOSIS — S0990XA Unspecified injury of head, initial encounter: Secondary | ICD-10-CM | POA: Diagnosis present

## 2016-12-05 DIAGNOSIS — W01198A Fall on same level from slipping, tripping and stumbling with subsequent striking against other object, initial encounter: Secondary | ICD-10-CM | POA: Insufficient documentation

## 2016-12-05 LAB — CBC
HCT: 29.3 % — ABNORMAL LOW (ref 40.0–52.0)
Hemoglobin: 9.9 g/dL — ABNORMAL LOW (ref 13.0–18.0)
MCH: 28.4 pg (ref 26.0–34.0)
MCHC: 33.8 g/dL (ref 32.0–36.0)
MCV: 84 fL (ref 80.0–100.0)
Platelets: 207 10*3/uL (ref 150–440)
RBC: 3.49 MIL/uL — ABNORMAL LOW (ref 4.40–5.90)
RDW: 15.6 % — ABNORMAL HIGH (ref 11.5–14.5)
WBC: 10.2 10*3/uL (ref 3.8–10.6)

## 2016-12-05 LAB — BASIC METABOLIC PANEL
Anion gap: 10 (ref 5–15)
BUN: 23 mg/dL — ABNORMAL HIGH (ref 6–20)
CO2: 26 mmol/L (ref 22–32)
Calcium: 8.9 mg/dL (ref 8.9–10.3)
Chloride: 99 mmol/L — ABNORMAL LOW (ref 101–111)
Creatinine, Ser: 1.4 mg/dL — ABNORMAL HIGH (ref 0.61–1.24)
GFR calc Af Amer: 49 mL/min — ABNORMAL LOW (ref >60)
GFR calc non Af Amer: 42 mL/min — ABNORMAL LOW (ref >60)
Glucose, Bld: 98 mg/dL (ref 65–99)
Potassium: 4.4 mmol/L (ref 3.5–5.1)
Sodium: 135 mmol/L (ref 135–145)

## 2016-12-05 MED ORDER — ACETAMINOPHEN 500 MG PO TABS
1000.0000 mg | ORAL_TABLET | Freq: Once | ORAL | Status: AC
  Administered 2016-12-05: 1000 mg via ORAL
  Filled 2016-12-05: qty 2

## 2016-12-05 MED ORDER — BACITRACIN ZINC 500 UNIT/GM EX OINT
TOPICAL_OINTMENT | CUTANEOUS | Status: AC
  Administered 2016-12-05: 1
  Filled 2016-12-05: qty 3.6

## 2016-12-05 MED ORDER — LIDOCAINE-EPINEPHRINE-TETRACAINE (LET) SOLUTION
3.0000 mL | Freq: Once | NASAL | Status: AC
  Administered 2016-12-05: 3 mL via TOPICAL
  Filled 2016-12-05: qty 3

## 2016-12-05 MED ORDER — CEPHALEXIN 500 MG PO CAPS
500.0000 mg | ORAL_CAPSULE | Freq: Once | ORAL | Status: AC
  Administered 2016-12-05: 500 mg via ORAL
  Filled 2016-12-05: qty 1

## 2016-12-05 MED ORDER — BACITRACIN ZINC 500 UNIT/GM EX OINT
TOPICAL_OINTMENT | Freq: Once | CUTANEOUS | Status: AC
  Administered 2016-12-05: 1 via TOPICAL

## 2016-12-05 MED ORDER — LIDOCAINE-EPINEPHRINE 1 %-1:100000 IJ SOLN
10.0000 mL | Freq: Once | INTRAMUSCULAR | Status: AC
  Administered 2016-12-05: 10 mL via INTRADERMAL
  Filled 2016-12-05: qty 10

## 2016-12-05 MED ORDER — CEPHALEXIN 500 MG PO CAPS
500.0000 mg | ORAL_CAPSULE | Freq: Four times a day (QID) | ORAL | 0 refills | Status: AC
Start: 2016-12-05 — End: 2016-12-15

## 2016-12-05 MED ORDER — ACETAMINOPHEN 500 MG PO TABS
ORAL_TABLET | ORAL | Status: AC
  Filled 2016-12-05: qty 2

## 2016-12-05 MED ORDER — CEPHALEXIN 500 MG PO CAPS
ORAL_CAPSULE | ORAL | Status: AC
  Filled 2016-12-05: qty 1

## 2016-12-05 NOTE — Clinical Social Work Note (Signed)
Clinical Social Work Assessment  Patient Details  Name: [name] MRN: [id number] Date of Birth: February 26, 1923  Date of referral:  12/05/16               Reason for consult:  Discharge Planning, Other (Comment Required) (From [facility])                Permission sought to share information with:  Family Supports Permission granted to share information::  Yes, Verbal Permission Granted  Name::     Spouse: [place] [phone]  Agency::  [facility]  Relationship::     Contact Information:     Housing/Transportation Living arrangements for the past 2 months:  [place], [place] of Information:  Patient, Spouse Patient Interpreter Needed:  None Criminal Activity/Legal Involvement Pertinent to Current Situation/Hospitalization:  No - Comment as needed Significant Relationships:  Adult Children, Spouse, Other Family Members Lives with:  Spouse Do you feel safe going back to the place where you live?  Yes Need for family participation in patient care:  Yes (Comment)  Care giving concerns:  Wife wants him to return to their apartment. Grandaughter feels its time to have her grandfather movoed to a SNF in the near future.  Social Worker assessment / plan: LCSW met with patient a 83 year old male who has had another recent fall and injured his head. He is married to wife for 86 years and resides at [facility] for last 2-3 years. Patient gave verbal consent to answer questions to complete his assessment. He was recently at [facility] for [place] back in Feb 2018. Patient uses a walker and is independent with all his ADL's His speech is OK and he uses hearing aids. He is oriented x4.  He is insured with Medicare AARP. He is awaiting results from CT scan of his head and his plan is to return home to [facility] once discharged. Grandaughter asked for additional information of SNF and was provided some resources for grandparentsfuture needs. Patient does not wear 02,  is not incontinent showers on his own and dresses himself, just a bit challenged with button shirts. It appears this patient has loving spouse and caring family members. Will await to see what CT and EDP reveal.  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 He wants to return home : [facility].  Emotional Assessment Appearance:  Appears stated age Attitude/Demeanor/Rapport:   (Polite, calm) Affect (typically observed):  Accepting, Calm, Appropriate Orientation:  Oriented to Self, Oriented to Place, Oriented to  Time, Oriented to Situation Alcohol / Substance use:  Not Applicable Psych involvement (Current and /or in the community):  No (Comment)  Discharge Needs  Concerns to be addressed:  No discharge needs identified Readmission within the last 30 days:    Current discharge risk:  None Barriers to Discharge:  Continued Medical Work up   [place], [place], LCSW 12/05/2016, 1:56 PM

## 2016-12-05 NOTE — ED Provider Notes (Signed)
[facility] Emergency Department Provider Note  ____________________________________________  Time seen: Approximately 1:06 PM  I have reviewed the triage vital signs and the nursing notes.   HISTORY  Chief Complaint Fall    HPI [name] is a 83 y.o. male, not anticoagulated, w/ hx of recurrent falls, coming from [place] with his wife for mechanical fall. The patient stood up from his chair, with his walker, and the walker became unsteady, "it flipped over" patient fell, striking his head on a speaker was on the ground. He did not lose consciousness, and is not having any new neck or back pain. He was seen here yesterday for back pain - no new issues today.  No new numbness, tingling, weakness.  Pt has baseline R facial droop from [name] decades ago; this is unchanged.  No HA, n/v, change in mental status. Pt recently treated for R tibial abrasion w/ overlying cellulitis; Keflex course completed w/ initial improvement, but now noted to have increased surrounding erythema w/o purulent discharge and not improving with topical antibiotic cream.   Past Medical History:  Diagnosis Date  . Anxiety   . BPH (benign prostatic hypertrophy)   . Carotid stenosis    a. 05/2015 Carotid U/S: mild nonobs atherosclerosis. No need for f/u.  Marland Kitchen Chronic back pain   . Coronary artery disease    a. 06/2013 Cath/PCI: LAD 50-70p (FFR 0.82-->PCI w/ 3.5x24 Promus DES), RCA subtotally occluded w/ L->R collats, LCX 30ost.  . Dyslipidemia   . GERD (gastroesophageal reflux disease)   . Hx of [name]   . Hyperlipidemia   . Hypertension   . Inguinal hernia   . Lightheadedness    a. chronic, somewhat positional.  . 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 Major depression   . Multiple myeloma ([place])   . Pleural effusion    left  . Sleep apnea     Patient Active Problem List   Diagnosis Date Noted  . Acute kidney injury ([place]) 09/14/2016  . Head injury, intracranial,  without loss of consciousness or fracture ([place]) 09/07/2016  . Groin pain, left 06/15/2016  . Closed right hip fracture, with routine healing, subsequent encounter 04/15/2016  . Productive cough 03/23/2016  . Medicare annual wellness visit, subsequent 02/19/2016  . Major depression 10/14/2015  . Behavior disturbance 09/12/2015  . Mobility impaired 08/27/2015  . Skin ulcer of back ([place]) 07/01/2015  . Laceration of right hand 07/01/2015  . Traumatic hematoma of lower back 07/01/2015  . Encounters for administrative purpose 04/01/2015  . Skin lesion of face 12/24/2014  . Contusion of right hand 11/28/2014  . Herpes zoster 10/30/2014  . Need for immunization against influenza 10/24/2014  . Compression fracture of L4 lumbar vertebra (HCC) 09/28/2014  . Rectal or anal pain 08/13/2014  . Anxiety disorder 07/23/2014  . BPH without obstruction/lower urinary tract symptoms 07/23/2014  . Atherosclerosis of coronary artery 07/23/2014  . Chronic LBP 07/23/2014  . Hypercholesteremia 07/23/2014  . Benign hypertension 07/23/2014  . Acid reflux 07/23/2014  . Chronic recurrent major depressive disorder ([place]) 07/23/2014  . GERD (gastroesophageal reflux disease) 07/23/2014  . Coronary artery disease involving native coronary artery without angina pectoris 07/06/2014  . Essential hypertension 07/06/2014  . Carotid stenosis 07/06/2014  . Hyperlipidemia   . H/O [name] 12/20/2011    Past Surgical History:  Procedure Laterality Date  . ANTERIOR APPROACH HEMI HIP ARTHROPLASTY Right 04/16/2016   Procedure: ANTERIOR APPROACH HEMI HIP ARTHROPLASTY;  Surgeon: Hessie Knows, MD;  Location: [facility] ORS;  Service: Orthopedics;  Laterality: Right;  . APPENDECTOMY    . CARDIAC CATHETERIZATION  2002  . CARDIAC CATHETERIZATION  06/2013  . COLONOSCOPY    . FEMUR FRACTURE SURGERY Left    with rod  . HIP FRACTURE SURGERY    . JOINT REPLACEMENT     right partial knee replacement    Current Outpatient Rx  .  Order #: [id number] Class: OTC  . Order #: [id number] Class: Historical Med  . Order #: [id number] Class: Normal  . Order #: [id number] Class: Print  . Order #: [id number] Class: Print  . Order #: [id number] Class: Print  . Order #: [id number] Class: No Print  . Order #: [id number] Class: Normal  . Order #: [id number] Class: Print  . Order #: [id number] Class: Historical Med  . Order #: [id number] Class: Normal  . Order #: [id number] Class: Historical Med  . Order #: [id number] Class: Historical Med  . Order #: [id number] Class: Normal  . Order #: [id number] Class: Historical Med  . Order #: [id number] Class: Normal  . Order #: [id number] Class: Normal  . Order #: [id number] Class: Normal  . Order #: [id number] Class: Historical Med    Allergies Sulfamethoxazole-trimethoprim; Baclofen; Nsaids; and Tolmetin  Family History  Problem Relation Age of Onset  . Heart disease Mother   . Stroke Mother   . Diabetes Father   . Dementia Father   . Stroke Sister   . Stroke Brother   . Stroke Sister   . Stroke Sister   . Stroke Brother   . Stroke Brother     Social History Social History  Substance Use Topics  . Smoking status: Former Smoker    Packs/day: 2.00    Years: 7.00    Types: Cigarettes    Quit date: 09/17/1953  . Smokeless tobacco: Never Used  . Alcohol use No    Review of Systems Constitutional: No fever/chills.no lightheadedness or syncope. Positive mechanical fall. negative loss of consciousness Eyes: No visual changes.no blurred or double vision. [name]: No sore throat. No congestion or rhinorrhea. Cardiovascular: Denies chest pain. Denies palpitations. Respiratory: Denies shortness of breath.  No cough. Gastrointestinal: No abdominal pain.  No nausea, no vomiting.  No diarrhea.  No constipation. Genitourinary: Negative for dysuria.no urinary frequency. Musculoskeletal: positive for chronic back pain.no neck pain. Skin: positivefor abrasion and rash. + for scalp lac w/ surrounding  pain. Neurological: Negative for headaches. No focal numbness, tingling or weakness. No visual or speech changes. No mental status changes. + right facial droop from prior [name].    ____________________________________________   PHYSICAL EXAM:  VITAL SIGNS: ED Triage Vitals [12/05/16 1236]  Enc Vitals Group     BP 124/64     Pulse Rate 63     Resp 19     Temp 98 F (36.7 C)     Temp Source Oral     SpO2 99 %     Weight 208 lb (94.3 kg)     Height '5\' 10"'$  (1.778 m)     Head Circumference      Peak Flow      Pain Score 8     Pain Loc      Pain Edu?      Excl. in [place]?     Constitutional: Alert and oriented.  Answers questions appropriately. Comfortable appearing. GCS 15. Eyes: Conjunctivae are normal.  EOMI. PERRLA No scleral icterus. Head: Large complex scalp lac on the L top scalp that is approximately 20cm long, jagged, and may have skin loss, to the [place].  No palpable  unstable skull.. Nose: No congestion/rhinnorhea. No swelling over the nose or hematoma. Mouth/Throat: Mucous membranes are moist. No malocclusion or dental injury. Neck: No stridor.  Supple.  No midline cspine ttp, step offs or deformities. Cardiovascular: Normal rate, regular rhythm. No murmurs, rubs or gallops.  Respiratory: Normal respiratory effort.  No accessory muscle use or retractions. Lungs CTAB.  No wheezes, rales or ronchi. Gastrointestinal: Soft, nontender and nondistended.  No guarding or rebound.  No peritoneal signs. Musculoskeletal: pelvis is stable. Full range of motion of the bilateral shoulder, elbows, wrists, hips, knees, and ankles. No midline t or l spine ttp, stepoffs or deformities. No LE edema. No ttp in the calves or palpable cords.  Negative Homan's sign. R tibia has a 5cm superficial abrasions w/ a scab, w/ surrounding erythema approx 3cm from the abrasion site. Neurologic:  A&Ox3.  Speech is clear.  R facial droop, including forehead. EOMI.  PERRLA. Moves all extremities well.  No pronator drift. Skin:  Skin is warm, dry. No rash noted. 20cm scalp lac as described above. Psychiatric: Mood and affect are normal.   ____________________________________________   LABS (all labs ordered are listed, but only abnormal results are displayed)  Labs Reviewed  CBC - Abnormal; Notable for the following:       Result Value   RBC 3.49 (*)    Hemoglobin 9.9 (*)    HCT 29.3 (*)    RDW 15.6 (*)    All other components within normal limits  BASIC METABOLIC PANEL - Abnormal; Notable for the following:    Chloride 99 (*)    BUN 23 (*)    Creatinine, Ser 1.40 (*)    GFR calc non Af Amer 42 (*)    GFR calc Af Amer 49 (*)    All other components within normal limits  URINALYSIS, COMPLETE (UACMP) WITH MICROSCOPIC   ____________________________________________  EKG  ED ECG REPORT I, Eula Listen, the attending physician, personally viewed and interpreted this ECG.   Date: 12/05/2016  EKG Time: 1402  Rate: 60  Rhythm: normal sinus rhythm  Axis: normal  Intervals:first-degree A-V block   ST&T Change: nonspecific twave inversions V1; no STEMI  ____________________________________________  RADIOLOGY  Dg Thoracic Spine 2 View  Result Date: 12/04/2016 CLINICAL DATA:  [place] 2 weeks ago.  Back pain. EXAM: THORACIC SPINE 2 VIEWS; LUMBAR SPINE - COMPLETE 4+ VIEW COMPARISON:  Lumbar spine radiographs September 06, 2014 and MRI of the lumbar spine September 26, 2014 FINDINGS: Thoracic spine: Thoracic vertebral bodies intact and aligned with maintenance of thoracic kyphosis. Intervertebral disc heights preserved, multilevel ventral bridging osteophytes. No destructive bony lesions. Osteopenia. Prevertebral and paraspinal soft tissue planes are non-suspicious. Calcified aortic arch. Lumbar spine: Mild-to-moderate old, similar L4 compression fracture. Remaining lumbar vertebral bodies intact. Mild L5-S1 disc height loss with multilevel endplate spurring compatible with degenerative  discs. Moderate lower lumbar facet arthropathy. Osteopenia without destructive bony lesions. Status post bilateral total hip replacements. Aortoiliac atherosclerosis. Sacroiliac joints are symmetric. IMPRESSION: Thoracic spine: Degenerative change without fracture deformity or malalignment. Lumbar spine: Old mild-to-moderate L4 compression fracture. No acute fracture deformity or malalignment. Stable degenerative change. Aortic Atherosclerosis (ICD10-I70.0). Electronically Signed   By: Elon Alas M.D.   On: 12/04/2016 16:16   Dg Lumbar Spine Complete  Result Date: 12/04/2016 CLINICAL DATA:  [place] 2 weeks ago.  Back pain. EXAM: THORACIC SPINE 2 VIEWS; LUMBAR SPINE - COMPLETE 4+ VIEW COMPARISON:  Lumbar spine radiographs September 06, 2014 and MRI of the lumbar spine September 26, 2014 FINDINGS: Thoracic spine: Thoracic vertebral bodies intact and aligned with maintenance of thoracic kyphosis. Intervertebral disc heights preserved, multilevel ventral bridging osteophytes. No destructive bony lesions. Osteopenia. Prevertebral and paraspinal soft tissue planes are non-suspicious. Calcified aortic arch. Lumbar spine: Mild-to-moderate old, similar L4 compression fracture. Remaining lumbar vertebral bodies intact. Mild L5-S1 disc height loss with multilevel endplate spurring compatible with degenerative discs. Moderate lower lumbar facet arthropathy. Osteopenia without destructive bony lesions. Status post bilateral total hip replacements. Aortoiliac atherosclerosis. Sacroiliac joints are symmetric. IMPRESSION: Thoracic spine: Degenerative change without fracture deformity or malalignment. Lumbar spine: Old mild-to-moderate L4 compression fracture. No acute fracture deformity or malalignment. Stable degenerative change. Aortic Atherosclerosis (ICD10-I70.0). Electronically Signed   By: Elon Alas M.D.   On: 12/04/2016 16:16   Ct Head Wo Contrast  Result Date: 12/05/2016 CLINICAL DATA:  83 year old male with  acute head and neck injury from fall today. Initial encounter. EXAM: CT HEAD WITHOUT CONTRAST CT CERVICAL SPINE WITHOUT CONTRAST TECHNIQUE: Multidetector CT imaging of the head and cervical spine was performed following the standard protocol without intravenous contrast. Multiplanar CT image reconstructions of the cervical spine were also generated. COMPARISON:  09/12/2016 and prior CTs FINDINGS: CT HEAD FINDINGS Brain: No evidence of acute infarction, hemorrhage, hydrocephalus, extra-axial collection or mass lesion/mass effect. Atrophy and chronic small-vessel white matter ischemic changes again noted. Vascular: Atherosclerotic calcifications noted. Skull: Normal. Negative for fracture or focal lesion. Sinuses/Orbits: No acute abnormality Other: High left scalp soft tissue swelling identified. CT CERVICAL SPINE FINDINGS Alignment: Normal. Skull base and vertebrae: No acute fracture. No primary bone lesion or focal pathologic process. Soft tissues and spinal canal: No prevertebral fluid or swelling. No visible canal hematoma. Disc levels: Multilevel degenerative disc disease, spondylosis and facet arthropathy identified, greatest at C3-4. Upper chest: No acute abnormality Other: None IMPRESSION: 1. No evidence of acute intracranial abnormality 2. Left scalp soft tissue swelling without fracture. 3. No static evidence of acute injury to the cervical spine 4. Cerebral atrophy and chronic small-vessel white matter ischemic changes 5. Moderate multilevel degenerative changes within the cervical spine. Electronically Signed   By: Margarette Canada M.D.   On: 12/05/2016 13:59   Ct Cervical Spine Wo Contrast  Result Date: 12/05/2016 CLINICAL DATA:  83 year old male with acute head and neck injury from fall today. Initial encounter. EXAM: CT HEAD WITHOUT CONTRAST CT CERVICAL SPINE WITHOUT CONTRAST TECHNIQUE: Multidetector CT imaging of the head and cervical spine was performed following the standard protocol without  intravenous contrast. Multiplanar CT image reconstructions of the cervical spine were also generated. COMPARISON:  09/12/2016 and prior CTs FINDINGS: CT HEAD FINDINGS Brain: No evidence of acute infarction, hemorrhage, hydrocephalus, extra-axial collection or mass lesion/mass effect. Atrophy and chronic small-vessel white matter ischemic changes again noted. Vascular: Atherosclerotic calcifications noted. Skull: Normal. Negative for fracture or focal lesion. Sinuses/Orbits: No acute abnormality Other: High left scalp soft tissue swelling identified. CT CERVICAL SPINE FINDINGS Alignment: Normal. Skull base and vertebrae: No acute fracture. No primary bone lesion or focal pathologic process. Soft tissues and spinal canal: No prevertebral fluid or swelling. No visible canal hematoma. Disc levels: Multilevel degenerative disc disease, spondylosis and facet arthropathy identified, greatest at C3-4. Upper chest: No acute abnormality Other: None IMPRESSION: 1. No evidence of acute intracranial abnormality 2. Left scalp soft tissue swelling without fracture. 3. No static evidence of acute injury to the cervical spine 4. Cerebral atrophy and chronic small-vessel white matter ischemic changes 5. Moderate multilevel degenerative changes within the cervical spine.  Electronically Signed   By: Margarette Canada M.D.   On: 12/05/2016 13:59    ____________________________________________   PROCEDURES  Procedure(s) performed: None  Procedures  Critical Care performed: No ____________________________________________   INITIAL IMPRESSION / ASSESSMENT AND PLAN / ED COURSE  Pertinent labs & imaging results that were available during my care of the patient were reviewed by me and considered in my medical decision making (see chart for details).  83 y.o. Male with a hx of recurrent falls presenting w/ mechanical fall resulting in significant scalp laceration.  The pt is also found to have sx's of recurrent R tibial  cellulitis around a previous abrasion; no evidence for abscess or deeper infection.  Plan CT head and cspine to r/o any injury from fall.  Screening EKG w/ basic labs and UA.  Plan LET and lidocaine w/ lac repair to scalp laceration.  Last tetanus booster < 5y. Re-eval for final dispo.  ----------------------------------------- 3:10 PM on 12/05/2016 -----------------------------------------  The patient's traumatic workup in the emergency department has been reassuring.  He has a stable anemia, and stable renal insufficiency.  His EKG does not show arrhythmia or ischemic changes.  His CT does not show any acute intracranial process, or cspine injury.  I have repaired the patient's very complicated scalp laceration with sutures.  I have given the patient and his family strict instructions about monitoring his wounds, and suture care/removal.  Return precautions were discussed.  He will go home with oral Keflex for his cellulitis, which should also prophylactically cover his laceration.  Plan discharge at this time.  LACERATION REPAIR Performed by: Eula Listen Authorized by: Eula Listen Consent: Verbal consent obtained. Risks and benefits: risks, benefits and alternatives were discussed Consent given by: patient Patient identity confirmed: provided demographic data Prepped and Draped in normal sterile fashion Wound explored  Laceration Location: scalp  Laceration Length: 20cm, jagged  No Foreign Bodies seen or palpated  Anesthesia: topical and local infiltration  Local anesthetic: LET, lidocaine 1% with epinephrine  Anesthetic total: 6 ml  Irrigation method: syringe Amount of cleaning: standard  Skin closure: 4-0 Prolene  Number of sutures: 22  Technique: simple interrupted.  Complicated lac w/ difficult approximation given jagged nature of the lac, and missing pieces of scalp. There was some debridement of tissue at 4 o'clock, and two areas with some  approximation but inability for full closure due to missing scalp.    Patient tolerance: Patient tolerated the procedure well with no immediate complications.   ____________________________________________  FINAL CLINICAL IMPRESSION(S) / ED DIAGNOSES  Final diagnoses:  Cellulitis of right leg without foot  Fall, initial encounter  Laceration of scalp, initial encounter         NEW MEDICATIONS STARTED DURING THIS VISIT:  New Prescriptions   CEPHALEXIN (KEFLEX) 500 MG CAPSULE    Take 1 capsule (500 mg total) by mouth 4 (four) times daily.      Eula Listen, MD 12/05/16 1515

## 2016-12-05 NOTE — NC FL2 (Signed)
[place] LEVEL OF CARE SCREENING TOOL     IDENTIFICATION  Patient Name: [name] Birthdate: 1924-02-10 Sex: male Admission Date (Current Location): 12/05/2016  [facility] and [place] Number:  Engineering geologist and Address:  [facility], [address], [place], [place] [address]      Provider Number: [id number]  Attending Physician Name and Address:  Eula Listen, MD  Relative Name and Phone Number:       Current Level of Care: Hospital Recommended Level of Care: [place] Prior Approval Number:    Date Approved/Denied:   PASRR Number:    Discharge Plan: Home    Current Diagnoses: Patient Active Problem List   Diagnosis Date Noted  . Acute kidney injury ([place]) 09/14/2016  . Head injury, intracranial, without loss of consciousness or fracture ([place]) 09/07/2016  . Groin pain, left 06/15/2016  . Closed right hip fracture, with routine healing, subsequent encounter 04/15/2016  . Productive cough 03/23/2016  . Medicare annual wellness visit, subsequent 02/19/2016  . Major depression 10/14/2015  . Behavior disturbance 09/12/2015  . Mobility impaired 08/27/2015  . Skin ulcer of back ([place]) 07/01/2015  . Laceration of right hand 07/01/2015  . Traumatic hematoma of lower back 07/01/2015  . Encounters for administrative purpose 04/01/2015  . Skin lesion of face 12/24/2014  . Contusion of right hand 11/28/2014  . Herpes zoster 10/30/2014  . Need for immunization against influenza 10/24/2014  . Compression fracture of L4 lumbar vertebra (HCC) 09/28/2014  . Rectal or anal pain 08/13/2014  . Anxiety disorder 07/23/2014  . BPH without obstruction/lower urinary tract symptoms 07/23/2014  . Atherosclerosis of coronary artery 07/23/2014  . Chronic LBP 07/23/2014  . Hypercholesteremia 07/23/2014  . Benign hypertension 07/23/2014  . Acid reflux 07/23/2014  . Chronic recurrent major depressive disorder ([place])  07/23/2014  . GERD (gastroesophageal reflux disease) 07/23/2014  . Coronary artery disease involving native coronary artery without angina pectoris 07/06/2014  . Essential hypertension 07/06/2014  . Carotid stenosis 07/06/2014  . Hyperlipidemia   . H/O [name] 12/20/2011    Orientation RESPIRATION BLADDER Height & Weight     Time, Situation, Self, Place  Normal Incontinent Weight: 208 lb (94.3 kg) Height:  5\' 10"  (177.8 cm)  BEHAVIORAL SYMPTOMS/MOOD NEUROLOGICAL BOWEL NUTRITION STATUS      Continent    AMBULATORY STATUS COMMUNICATION OF NEEDS Skin   Independent Verbally Normal                       Personal Care Assistance Level of Assistance  Bathing Bathing Assistance: Independent         Functional Limitations Info  Sight, Hearing, Speech Sight Info: Adequate Hearing Info: Impaired (uses 2 hearing aids) Speech Info: Adequate    SPECIAL CARE FACTORS FREQUENCY  PT (By licensed PT), OT (By licensed OT)     PT Frequency: 5x OT Frequency: 3x            Contractures Contractures Info: Not present    Additional Factors Info  Allergies   Allergies Info: Sulfamethoxazole,baclofin,Nsauds, Tolmentin           Current Medications (12/05/2016):  This is the current hospital active medication list Current Facility-Administered Medications  Medication Dose Route Frequency Provider Last Rate Last Dose  . acetaminophen (TYLENOL) 500 MG tablet           . cephALEXin (KEFLEX) 500 MG capsule           .  lidocaine-EPINEPHrine (XYLOCAINE W/EPI) 1 %-1:100000 (with pres) injection 10 mL  10 mL Intradermal Once Eula Listen, MD       Current Outpatient Prescriptions  Medication Sig Dispense Refill  . acetaminophen (TYLENOL) 500 MG tablet Take 2 tablets (1,000 mg total) by mouth every 6 (six) hours. 30 tablet 0  . acidophilus (RISAQUAD) CAPS capsule Take by mouth.    . bacitracin 500 UNIT/GM ointment Apply 1 application topically 2 (two) times daily. 15 g  0  . cephALEXin (KEFLEX) 500 MG capsule Take 500 mg by mouth 2 (two) times daily.    . clonazePAM (KLONOPIN) 0.5 MG tablet Take 1 tablet (0.5 mg total) by mouth 3 (three) times daily as needed for anxiety. 90 tablet 2  . cyclobenzaprine (FLEXERIL) 10 MG tablet Take 1 tablet (10 mg total) by mouth 3 (three) times daily as needed. 15 tablet 0  . docusate sodium (COLACE) 100 MG capsule Take 1 capsule (100 mg total) by mouth 2 (two) times daily. 10 capsule 0  . doxazosin (CARDURA) 1 MG tablet Take 1 tablet (1 mg total) by mouth daily. 90 tablet 0  . isosorbide mononitrate (IMDUR) 30 MG 24 hr tablet Take 1 tablet (30 mg total) by mouth daily. 90 tablet 1  . lisinopril (PRINIVIL,ZESTRIL) 2.5 MG tablet Take 2.5 mg by mouth daily.    . metoprolol tartrate (LOPRESSOR) 25 MG tablet 50 mg po qAM, 25 mg po qHS (Patient taking differently: Take 25-50 mg by mouth 2 (two) times daily. 50 mg po qAM, 25 mg po qHS) 270 tablet 0  . Multiple Vitamins-Minerals (PRESERVISION/LUTEIN PO) Take by mouth 2 (two) times daily.    . nitroGLYCERIN (NITROSTAT) 0.4 MG SL tablet Place 0.4 mg under the tongue every 5 (five) minutes as needed for chest pain.    . pantoprazole (PROTONIX) 40 MG tablet Take 1 tablet (40 mg total) by mouth daily. 90 tablet 0  . polyethylene glycol (MIRALAX / GLYCOLAX) packet Take 17 g by mouth daily.    . pravastatin (PRAVACHOL) 40 MG tablet Take 1 tablet (40 mg total) by mouth daily. 90 tablet 0  . sertraline (ZOLOFT) 100 MG tablet Take 1.5 tablets (150 mg total) by mouth at bedtime. 135 tablet 0  . tiZANidine (ZANAFLEX) 2 MG tablet Take 1 tablet (2 mg total) by mouth every 8 (eight) hours as needed for muscle spasms. 21 tablet 0  . traMADol (ULTRAM) 50 MG tablet Take 50 mg by mouth every 6 (six) hours as needed for moderate pain or severe pain.        Discharge Medications: Please see discharge summary for a list of discharge medications.  Relevant Imaging Results:  Relevant Lab  Results:   Additional Information [place], [place], [place]

## 2016-12-05 NOTE — NC FL2 (Signed)
[place] LEVEL OF CARE SCREENING TOOL     IDENTIFICATION  Patient Name: [name] Birthdate: 1923-05-20 Sex: male Admission Date (Current Location): 12/05/2016  [place] and [place] Number:  Engineering geologist and Address:  [facility], [address] [place], [place] [address]      Provider Number: [id number]  Attending Physician Name and Address:  Eula Listen, MD  Relative Name and Phone Number:       Current Level of Care: Hospital Recommended Level of Care: [place] Prior Approval Number:    Date Approved/Denied:   PASRR Number:   [id number] Discharge Plan: Home    Current Diagnoses: Patient Active Problem List   Diagnosis Date Noted  . Acute kidney injury ([place]) 09/14/2016  . Head injury, intracranial, without loss of consciousness or fracture ([place]) 09/07/2016  . Groin pain, left 06/15/2016  . Closed right hip fracture, with routine healing, subsequent encounter 04/15/2016  . Productive cough 03/23/2016  . Medicare annual wellness visit, subsequent 02/19/2016  . Major depression 10/14/2015  . Behavior disturbance 09/12/2015  . Mobility impaired 08/27/2015  . Skin ulcer of back ([place]) 07/01/2015  . Laceration of right hand 07/01/2015  . Traumatic hematoma of lower back 07/01/2015  . Encounters for administrative purpose 04/01/2015  . Skin lesion of face 12/24/2014  . Contusion of right hand 11/28/2014  . Herpes zoster 10/30/2014  . Need for immunization against influenza 10/24/2014  . Compression fracture of L4 lumbar vertebra (HCC) 09/28/2014  . Rectal or anal pain 08/13/2014  . Anxiety disorder 07/23/2014  . BPH without obstruction/lower urinary tract symptoms 07/23/2014  . Atherosclerosis of coronary artery 07/23/2014  . Chronic LBP 07/23/2014  . Hypercholesteremia 07/23/2014  . Benign hypertension 07/23/2014  . Acid reflux 07/23/2014  . Chronic recurrent major depressive disorder  ([place]) 07/23/2014  . GERD (gastroesophageal reflux disease) 07/23/2014  . Coronary artery disease involving native coronary artery without angina pectoris 07/06/2014  . Essential hypertension 07/06/2014  . Carotid stenosis 07/06/2014  . Hyperlipidemia   . H/O [name] 12/20/2011    Orientation RESPIRATION BLADDER Height & Weight     Time, Situation, Self, Place  Normal Incontinent Weight: 208 lb (94.3 kg) Height:  5\' 10"  (177.8 cm)  BEHAVIORAL SYMPTOMS/MOOD NEUROLOGICAL BOWEL NUTRITION STATUS      Continent    AMBULATORY STATUS COMMUNICATION OF NEEDS Skin   Independent Verbally Normal                       Personal Care Assistance Level of Assistance  Bathing Bathing Assistance: Independent         Functional Limitations Info  Sight, Hearing, Speech Sight Info: Adequate Hearing Info: Impaired (uses 2 hearing aids) Speech Info: Adequate    SPECIAL CARE FACTORS FREQUENCY  PT (By licensed PT), OT (By licensed OT)     PT Frequency: 5x OT Frequency: 3x            Contractures Contractures Info: Not present    Additional Factors Info  Allergies   Allergies Info: Sulfamethoxazole,baclofin,Nsauds, Tolmentin           Current Medications (12/05/2016):  This is the current hospital active medication list Current Facility-Administered Medications  Medication Dose Route Frequency Provider Last Rate Last Dose  . acetaminophen (TYLENOL) 500 MG tablet           . cephALEXin (KEFLEX) 500 MG capsule           .  lidocaine-EPINEPHrine (XYLOCAINE W/EPI) 1 %-1:100000 (with pres) injection 10 mL  10 mL Intradermal Once Eula Listen, MD       Current Outpatient Prescriptions  Medication Sig Dispense Refill  . acetaminophen (TYLENOL) 500 MG tablet Take 2 tablets (1,000 mg total) by mouth every 6 (six) hours. 30 tablet 0  . acidophilus (RISAQUAD) CAPS capsule Take by mouth.    . bacitracin 500 UNIT/GM ointment Apply 1 application topically 2 (two) times  daily. 15 g 0  . cephALEXin (KEFLEX) 500 MG capsule Take 500 mg by mouth 2 (two) times daily.    . clonazePAM (KLONOPIN) 0.5 MG tablet Take 1 tablet (0.5 mg total) by mouth 3 (three) times daily as needed for anxiety. 90 tablet 2  . cyclobenzaprine (FLEXERIL) 10 MG tablet Take 1 tablet (10 mg total) by mouth 3 (three) times daily as needed. 15 tablet 0  . docusate sodium (COLACE) 100 MG capsule Take 1 capsule (100 mg total) by mouth 2 (two) times daily. 10 capsule 0  . doxazosin (CARDURA) 1 MG tablet Take 1 tablet (1 mg total) by mouth daily. 90 tablet 0  . isosorbide mononitrate (IMDUR) 30 MG 24 hr tablet Take 1 tablet (30 mg total) by mouth daily. 90 tablet 1  . lisinopril (PRINIVIL,ZESTRIL) 2.5 MG tablet Take 2.5 mg by mouth daily.    . metoprolol tartrate (LOPRESSOR) 25 MG tablet 50 mg po qAM, 25 mg po qHS (Patient taking differently: Take 25-50 mg by mouth 2 (two) times daily. 50 mg po qAM, 25 mg po qHS) 270 tablet 0  . Multiple Vitamins-Minerals (PRESERVISION/LUTEIN PO) Take by mouth 2 (two) times daily.    . nitroGLYCERIN (NITROSTAT) 0.4 MG SL tablet Place 0.4 mg under the tongue every 5 (five) minutes as needed for chest pain.    . pantoprazole (PROTONIX) 40 MG tablet Take 1 tablet (40 mg total) by mouth daily. 90 tablet 0  . polyethylene glycol (MIRALAX / GLYCOLAX) packet Take 17 g by mouth daily.    . pravastatin (PRAVACHOL) 40 MG tablet Take 1 tablet (40 mg total) by mouth daily. 90 tablet 0  . sertraline (ZOLOFT) 100 MG tablet Take 1.5 tablets (150 mg total) by mouth at bedtime. 135 tablet 0  . tiZANidine (ZANAFLEX) 2 MG tablet Take 1 tablet (2 mg total) by mouth every 8 (eight) hours as needed for muscle spasms. 21 tablet 0  . traMADol (ULTRAM) 50 MG tablet Take 50 mg by mouth every 6 (six) hours as needed for moderate pain or severe pain.        Discharge Medications: Please see discharge summary for a list of discharge medications.  Relevant Imaging Results:  Relevant Lab  Results:   Additional Information [place], [place], [place]

## 2016-12-05 NOTE — Progress Notes (Signed)
LCSW met with patient and his wife and with verbal consent completed assessment. Patient lives at [facility] and remains oriented x4. Family is hopeful he can return home vs a SNF. LCSW will follow this family in ED until doctor decides plan of care. LCSW will complete assessment and Fl2  Maudy Yonan LCSW

## 2016-12-05 NOTE — ED Triage Notes (Signed)
Per EMS pt comes from [facility] and was getting up from his chair and fell.  Pt denies LOC and is A&Ox4.  Pt states 8/10 pain.

## 2016-12-05 NOTE — Discharge Instructions (Signed)
Please keep your sutured area clean and dry.  Apply Neosporin, or any triple antibiotic cream, in a thick coat three times daily.    Please seek immediate medical attention if you develop swelling, redness, warmth or pus drainage at the laceration site.  Return to the emergency department for severe pain, headache, vomiting, changes in mental status, fever, or any other symptoms concerning to you.

## 2016-12-07 ENCOUNTER — Observation Stay
Admission: EM | Admit: 2016-12-07 | Discharge: 2016-12-09 | Disposition: A | Discharge status: Home / Self Care | Payer: Medicare Other | Attending: Internal Medicine | Admitting: Internal Medicine

## 2016-12-07 ENCOUNTER — Emergency Department: Payer: Medicare Other

## 2016-12-07 ENCOUNTER — Encounter: Payer: Self-pay | Admitting: Emergency Medicine

## 2016-12-07 DIAGNOSIS — G4733 Obstructive sleep apnea (adult) (pediatric): Secondary | ICD-10-CM | POA: Diagnosis not present

## 2016-12-07 DIAGNOSIS — I10 Essential (primary) hypertension: Secondary | ICD-10-CM | POA: Insufficient documentation

## 2016-12-07 DIAGNOSIS — Z9181 History of falling: Secondary | ICD-10-CM | POA: Diagnosis not present

## 2016-12-07 DIAGNOSIS — T50905A Adverse effect of unspecified drugs, medicaments and biological substances, initial encounter: Secondary | ICD-10-CM | POA: Insufficient documentation

## 2016-12-07 DIAGNOSIS — N400 Enlarged prostate without lower urinary tract symptoms: Secondary | ICD-10-CM | POA: Insufficient documentation

## 2016-12-07 DIAGNOSIS — M549 Dorsalgia, unspecified: Secondary | ICD-10-CM | POA: Diagnosis not present

## 2016-12-07 DIAGNOSIS — I2510 Atherosclerotic heart disease of native coronary artery without angina pectoris: Secondary | ICD-10-CM | POA: Insufficient documentation

## 2016-12-07 DIAGNOSIS — R4182 Altered mental status, unspecified: Secondary | ICD-10-CM | POA: Diagnosis not present

## 2016-12-07 DIAGNOSIS — G8929 Other chronic pain: Secondary | ICD-10-CM | POA: Diagnosis not present

## 2016-12-07 DIAGNOSIS — Z888 Allergy status to other drugs, medicaments and biological substances status: Secondary | ICD-10-CM | POA: Diagnosis not present

## 2016-12-07 DIAGNOSIS — F419 Anxiety disorder, unspecified: Secondary | ICD-10-CM | POA: Diagnosis not present

## 2016-12-07 DIAGNOSIS — Z79899 Other long term (current) drug therapy: Secondary | ICD-10-CM | POA: Diagnosis not present

## 2016-12-07 DIAGNOSIS — Z791 Long term (current) use of non-steroidal anti-inflammatories (NSAID): Secondary | ICD-10-CM | POA: Diagnosis not present

## 2016-12-07 DIAGNOSIS — Z87891 Personal history of nicotine dependence: Secondary | ICD-10-CM | POA: Insufficient documentation

## 2016-12-07 DIAGNOSIS — E785 Hyperlipidemia, unspecified: Secondary | ICD-10-CM | POA: Diagnosis not present

## 2016-12-07 DIAGNOSIS — Y9289 Other specified places as the place of occurrence of the external cause: Secondary | ICD-10-CM | POA: Diagnosis not present

## 2016-12-07 DIAGNOSIS — K219 Gastro-esophageal reflux disease without esophagitis: Secondary | ICD-10-CM | POA: Diagnosis not present

## 2016-12-07 DIAGNOSIS — F329 Major depressive disorder, single episode, unspecified: Secondary | ICD-10-CM | POA: Diagnosis not present

## 2016-12-07 DIAGNOSIS — R531 Weakness: Secondary | ICD-10-CM | POA: Insufficient documentation

## 2016-12-07 DIAGNOSIS — C9000 Multiple myeloma not having achieved remission: Secondary | ICD-10-CM | POA: Insufficient documentation

## 2016-12-07 LAB — URINALYSIS, COMPLETE (UACMP) WITH MICROSCOPIC
Bacteria, UA: NONE SEEN
Bilirubin Urine: NEGATIVE
Glucose, UA: NEGATIVE mg/dL
Hgb urine dipstick: NEGATIVE
Ketones, ur: NEGATIVE mg/dL
Leukocytes, UA: NEGATIVE
Nitrite: NEGATIVE
Protein, ur: NEGATIVE mg/dL
RBC / HPF: NONE SEEN RBC/hpf (ref 0–5)
Specific Gravity, Urine: 1.024 (ref 1.005–1.030)
Squamous Epithelial / LPF: NONE SEEN
pH: 5 (ref 5.0–8.0)

## 2016-12-07 LAB — BASIC METABOLIC PANEL
Anion gap: 8 (ref 5–15)
BUN: 24 mg/dL — ABNORMAL HIGH (ref 6–20)
CO2: 25 mmol/L (ref 22–32)
Calcium: 9.2 mg/dL (ref 8.9–10.3)
Chloride: 103 mmol/L (ref 101–111)
Creatinine, Ser: 1.65 mg/dL — ABNORMAL HIGH (ref 0.61–1.24)
GFR calc Af Amer: 40 mL/min — ABNORMAL LOW (ref >60)
GFR calc non Af Amer: 35 mL/min — ABNORMAL LOW (ref >60)
Glucose, Bld: 125 mg/dL — ABNORMAL HIGH (ref 65–99)
Potassium: 4.4 mmol/L (ref 3.5–5.1)
Sodium: 136 mmol/L (ref 135–145)

## 2016-12-07 LAB — CBC
HCT: 29 % — ABNORMAL LOW (ref 40.0–52.0)
Hemoglobin: 9.6 g/dL — ABNORMAL LOW (ref 13.0–18.0)
MCH: 27.7 pg (ref 26.0–34.0)
MCHC: 33 g/dL (ref 32.0–36.0)
MCV: 84.1 fL (ref 80.0–100.0)
Platelets: 198 10*3/uL (ref 150–440)
RBC: 3.45 MIL/uL — ABNORMAL LOW (ref 4.40–5.90)
RDW: 16.1 % — ABNORMAL HIGH (ref 11.5–14.5)
WBC: 12.5 10*3/uL — ABNORMAL HIGH (ref 3.8–10.6)

## 2016-12-07 LAB — TROPONIN I: Troponin I: <0.03 ng/mL (ref <0.03)

## 2016-12-07 MED ORDER — PANTOPRAZOLE SODIUM 40 MG PO TBEC
40.0000 mg | DELAYED_RELEASE_TABLET | Freq: Every day | ORAL | Status: DC
  Administered 2016-12-08 – 2016-12-09 (×2): 40 mg via ORAL
  Filled 2016-12-07 (×2): qty 1

## 2016-12-07 MED ORDER — METOPROLOL TARTRATE 25 MG PO TABS
25.0000 mg | ORAL_TABLET | Freq: Two times a day (BID) | ORAL | Status: DC
  Administered 2016-12-07: 21:00:00 25 mg via ORAL
  Filled 2016-12-07: qty 1

## 2016-12-07 MED ORDER — LIDOCAINE 5 % EX PTCH
MEDICATED_PATCH | CUTANEOUS | Status: AC
  Administered 2016-12-07: 1 via TRANSDERMAL
  Filled 2016-12-07: qty 1

## 2016-12-07 MED ORDER — SERTRALINE HCL 50 MG PO TABS
150.0000 mg | ORAL_TABLET | Freq: Every day | ORAL | Status: DC
  Administered 2016-12-07 – 2016-12-08 (×2): 150 mg via ORAL
  Filled 2016-12-07 (×2): qty 3

## 2016-12-07 MED ORDER — SODIUM CHLORIDE 0.9 % IV BOLUS (SEPSIS)
500.0000 mL | Freq: Once | INTRAVENOUS | Status: AC
  Administered 2016-12-07: 500 mL via INTRAVENOUS

## 2016-12-07 MED ORDER — ISOSORBIDE MONONITRATE ER 30 MG PO TB24
30.0000 mg | ORAL_TABLET | Freq: Every day | ORAL | Status: DC
  Administered 2016-12-08 – 2016-12-09 (×2): 30 mg via ORAL
  Filled 2016-12-07 (×2): qty 1

## 2016-12-07 MED ORDER — ONDANSETRON HCL 4 MG PO TABS: 4.0000 mg | ORAL_TABLET | Freq: Four times a day (QID) | ORAL | Status: DC | PRN

## 2016-12-07 MED ORDER — LIDOCAINE 5 % EX PTCH
1.0000 | MEDICATED_PATCH | CUTANEOUS | Status: DC
  Administered 2016-12-07 – 2016-12-09 (×3): 1 via TRANSDERMAL
  Filled 2016-12-07 (×3): qty 1

## 2016-12-07 MED ORDER — ENOXAPARIN SODIUM 40 MG/0.4ML ~~LOC~~ SOLN
40.0000 mg | SUBCUTANEOUS | Status: DC
  Administered 2016-12-07 – 2016-12-08 (×2): 40 mg via SUBCUTANEOUS
  Filled 2016-12-07 (×2): qty 0.4

## 2016-12-07 MED ORDER — TRAMADOL HCL 50 MG PO TABS
50.0000 mg | ORAL_TABLET | Freq: Four times a day (QID) | ORAL | Status: DC | PRN
Start: 2016-12-07 — End: 2016-12-09
  Administered 2016-12-07 – 2016-12-09 (×4): 50 mg via ORAL
  Filled 2016-12-07 (×4): qty 1

## 2016-12-07 MED ORDER — ACETAMINOPHEN 325 MG PO TABS
650.0000 mg | ORAL_TABLET | Freq: Four times a day (QID) | ORAL | Status: DC | PRN
  Administered 2016-12-07 – 2016-12-09 (×4): 650 mg via ORAL
  Filled 2016-12-07 (×4): qty 2

## 2016-12-07 MED ORDER — DOXAZOSIN MESYLATE 1 MG PO TABS
1.0000 mg | ORAL_TABLET | Freq: Every day | ORAL | Status: DC
  Administered 2016-12-08 – 2016-12-09 (×2): 1 mg via ORAL
  Filled 2016-12-07 (×3): qty 1

## 2016-12-07 MED ORDER — ACETAMINOPHEN 650 MG RE SUPP: 650.0000 mg | Freq: Four times a day (QID) | RECTAL | Status: DC | PRN

## 2016-12-07 MED ORDER — NITROGLYCERIN 0.4 MG SL SUBL: 0.4000 mg | SUBLINGUAL_TABLET | SUBLINGUAL | Status: DC | PRN

## 2016-12-07 MED ORDER — BACITRACIN 500 UNIT/GM EX OINT
1.0000 "application " | TOPICAL_OINTMENT | Freq: Two times a day (BID) | CUTANEOUS | Status: DC
  Administered 2016-12-07 – 2016-12-09 (×4): 1 via TOPICAL
  Filled 2016-12-07 (×6): qty 0.9

## 2016-12-07 MED ORDER — TIZANIDINE HCL 2 MG PO TABS
2.0000 mg | ORAL_TABLET | Freq: Three times a day (TID) | ORAL | Status: DC | PRN
  Administered 2016-12-07 – 2016-12-09 (×3): 2 mg via ORAL
  Filled 2016-12-07 (×4): qty 1

## 2016-12-07 MED ORDER — LIDOCAINE 5 % EX PTCH: 1.0000 | MEDICATED_PATCH | CUTANEOUS | 1 refills | Status: DC

## 2016-12-07 MED ORDER — ONDANSETRON HCL 4 MG/2ML IJ SOLN: 4.0000 mg | Freq: Four times a day (QID) | INTRAMUSCULAR | Status: DC | PRN

## 2016-12-07 MED ORDER — CLONAZEPAM 0.5 MG PO TABS
0.5000 mg | ORAL_TABLET | Freq: Three times a day (TID) | ORAL | Status: DC | PRN
  Administered 2016-12-07 – 2016-12-09 (×3): 0.5 mg via ORAL
  Filled 2016-12-07 (×3): qty 1

## 2016-12-07 MED ORDER — POLYETHYLENE GLYCOL 3350 17 G PO PACK
17.0000 g | PACK | Freq: Every day | ORAL | Status: DC
  Administered 2016-12-08 – 2016-12-09 (×2): 17 g via ORAL
  Filled 2016-12-07 (×2): qty 1

## 2016-12-07 MED ORDER — PRAVASTATIN SODIUM 20 MG PO TABS
40.0000 mg | ORAL_TABLET | Freq: Every day | ORAL | Status: DC
Start: 2016-12-07 — End: 2016-12-09
  Administered 2016-12-07 – 2016-12-08 (×2): 40 mg via ORAL
  Filled 2016-12-07 (×2): qty 2

## 2016-12-07 NOTE — ED Notes (Addendum)
pts wife called this RN into room to assist to bathroom, after PT consult pt was deemed unsteady on his feet, upon entering room pt sat on side of bed and assisted with urinal, pt was pulling at leads and stating "I cant I want to get up", pt informed that PT stated pt was very weak, pts wife at bedside stating "he cant go while someone is watching him", this RN informed pt and wife again that it was unsafe for pt to get up at this time, this RN continued to Hold urinal for pt while he was sitting on side of bed, pt then leaned back and states "I cant go", this RN then called another RN (Cassie) into room to help put pullup on pt and readjust in bed, pt was then given call bell and urinal was left on bed rail and pt was told to call if he felt the need to urinate again but the pull up was incase pt had accident and thus his clothes would not get wet, this RN never stated for pt to wet the bed but to call for assistance when he felt the need to urinate again, pt pulled up in bed, wife at bedside

## 2016-12-07 NOTE — Care Management Note (Signed)
Case Management Note  Patient Details  Name: [name] MRN: [id number] Date of Birth: [birth year]  Subjective/Objective:    Saw patient and spouse at bedside with csw [name] also in attendance.  Shewas in the process of answering a phone call on her cell, and when we offered to come back she said"my time is just as valuable as yours". I replied to her that although all time is valuable, we do have more patients to see.               Spoke to the patient and wife at length about Medicare requirements for SNF placement  . Let them know the MD is thinking of sending the patient home with [name] PT and CSW. The spouse is not happy with nursing rolling the patients shirt in a ball. She states she already Has [name] coming to the home for 5 hrs weekly which they pay for out of pocket. The spouse also says she is talking to someone from the [place] tomorrow, an appt. That was cancelled earlier, about services they may cover for the patient. The MD will order [name] services and I will call [name] to see if they can accomodate the patient for P.T. And CSW. Action/Plan:   Expected Discharge Date:                  Expected Discharge Plan:     In-House Referral:     Discharge planning Services     Post Acute Care Choice:    Choice offered to:     DME Arranged:    DME Agency:     HH Arranged:    HH Agency:     Status of Service:     If discussed at [employer] of Stay Meetings, dates discussed:    Additional Comments:  [name], RN 12/07/2016, 10:46 AM

## 2016-12-07 NOTE — Care Management Note (Signed)
Case Management Note  Patient Details  Name: [name] MRN: [id number] Date of Birth: November 29, 1923  Subjective/Objective:    Spoke to MD and the pt. Is being admitted now under observation for hallucinations and safety concerns because it took 3 to assist the patient at bedside. I have asked the RN for the patient to get the [facility] declination form signed by the wife. It is my understanding from the MD that the wife feels the [place] is too far away.                Action/Plan:   Expected Discharge Date:                  Expected Discharge Plan:     In-House Referral:     Discharge planning Services     Post Acute Care Choice:    Choice offered to:     DME Arranged:    DME Agency:     HH Arranged:    HH Agency:     Status of Service:     If discussed at [employer] of Stay Meetings, dates discussed:    Additional Comments:  [name], RN 12/07/2016, 3:25 PM

## 2016-12-07 NOTE — ED Notes (Signed)
Patient is resting comfortably. Wife at bedside.

## 2016-12-07 NOTE — ED Notes (Signed)
Resumed care from [facility].  Iv started.  Family with pt.  Pt waiting on admission.  Pt alert.

## 2016-12-07 NOTE — ED Notes (Addendum)
This RN sat patient up on side of bed and got patient dressed. Pt is noted to have difficulty standing at this time. Pt's wife now states that she is unable to handle him due to him not being able to ambulate without maximum assistance. Requesting EMS transport home. Brayton Layman, Agricultural consultant notified.

## 2016-12-07 NOTE — Care Management Note (Signed)
Case Management Note  Patient Details  Name: ANDREWJAMES WEIRAUCH MRN: [id number] Date of Birth: 11-Nov-1923  Subjective/Objective:                 Referral to [name] taken by [facility] at the main [place]. I did let her know the spouse asked for [facility], and she will check to see if she is available.  Action/Plan:   Expected Discharge Date:                  Expected Discharge Plan:     In-House Referral:     Discharge planning Services     Post Acute Care Choice:    Choice offered to:     DME Arranged:    DME Agency:     HH Arranged:    HH Agency:     Status of Service:     If discussed at [employer] of Stay Meetings, dates discussed:    Additional Comments:  [name], RN 12/07/2016, 10:55 AM

## 2016-12-07 NOTE — ED Provider Notes (Signed)
[facility] Emergency Department Provider Note   ____________________________________________   First MD Initiated Contact with Patient 12/07/16 [phone]     (approximate)  I have reviewed the triage vital signs and the nursing notes.   HISTORY  Chief Complaint Altered Mental Status    HPI [name] is a 83 y.o. male Who comes into the hospital today with altered mental status. EMS states that the patient fell 2 days ago and hit his head. He was given some antibiotic for cellulitis and a muscle relaxer. According to EMS and the patient's wife he has been more confused and weak since the fall 2 days ago. He has also been hallucinating in complaining of back pain. The patient denies any chest pain, abdominal pain, headache, nausea, vomiting. The patient is here today for evaluation.   Past Medical History:  Diagnosis Date  . Anxiety   . BPH (benign prostatic hypertrophy)   . Carotid stenosis    a. 05/2015 Carotid U/S: mild nonobs atherosclerosis. No need for f/u.  Marland Kitchen Chronic back pain   . Coronary artery disease    a. 06/2013 Cath/PCI: LAD 50-70p (FFR 0.82-->PCI w/ 3.5x24 Promus DES), RCA subtotally occluded w/ L->R collats, LCX 30ost.  . Dyslipidemia   . GERD (gastroesophageal reflux disease)   . Hx of [name]   . Hyperlipidemia   . Hypertension   . Inguinal hernia   . Lightheadedness    a. chronic, somewhat positional.  . 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 Major depression   . Multiple myeloma ([place])   . Pleural effusion    left  . Sleep apnea     Patient Active Problem List   Diagnosis Date Noted  . Acute kidney injury ([place]) 09/14/2016  . Head injury, intracranial, without loss of consciousness or fracture ([place]) 09/07/2016  . Groin pain, left 06/15/2016  . Closed right hip fracture, with routine healing, subsequent encounter 04/15/2016  . Productive cough 03/23/2016  . Medicare annual wellness visit, subsequent 02/19/2016  . Major  depression 10/14/2015  . Behavior disturbance 09/12/2015  . Mobility impaired 08/27/2015  . Skin ulcer of back ([place]) 07/01/2015  . Laceration of right hand 07/01/2015  . Traumatic hematoma of lower back 07/01/2015  . Encounters for administrative purpose 04/01/2015  . Skin lesion of face 12/24/2014  . Contusion of right hand 11/28/2014  . Herpes zoster 10/30/2014  . Need for immunization against influenza 10/24/2014  . Compression fracture of L4 lumbar vertebra (HCC) 09/28/2014  . Rectal or anal pain 08/13/2014  . Anxiety disorder 07/23/2014  . BPH without obstruction/lower urinary tract symptoms 07/23/2014  . Atherosclerosis of coronary artery 07/23/2014  . Chronic LBP 07/23/2014  . Hypercholesteremia 07/23/2014  . Benign hypertension 07/23/2014  . Acid reflux 07/23/2014  . Chronic recurrent major depressive disorder ([place]) 07/23/2014  . GERD (gastroesophageal reflux disease) 07/23/2014  . Coronary artery disease involving native coronary artery without angina pectoris 07/06/2014  . Essential hypertension 07/06/2014  . Carotid stenosis 07/06/2014  . Hyperlipidemia   . H/O [name] 12/20/2011    Past Surgical History:  Procedure Laterality Date  . ANTERIOR APPROACH HEMI HIP ARTHROPLASTY Right 04/16/2016   Procedure: ANTERIOR APPROACH HEMI HIP ARTHROPLASTY;  Surgeon: Hessie Knows, MD;  Location: [facility] ORS;  Service: Orthopedics;  Laterality: Right;  . APPENDECTOMY    . CARDIAC CATHETERIZATION  2002  . CARDIAC CATHETERIZATION  06/2013  . COLONOSCOPY    . FEMUR FRACTURE SURGERY Left    with rod  .  HIP FRACTURE SURGERY    . JOINT REPLACEMENT     right partial knee replacement    Prior to Admission medications   Medication Sig Start Date End Date Taking? Authorizing Provider  acetaminophen (TYLENOL) 500 MG tablet Take 2 tablets (1,000 mg total) by mouth every 6 (six) hours. 04/17/16  Yes Dustin Flock, MD  cephALEXin (KEFLEX) 500 MG capsule Take 1 capsule (500 mg total) by  mouth 4 (four) times daily. 12/05/16 12/15/16 Yes Eula Listen, MD  clonazePAM (KLONOPIN) 0.5 MG tablet Take 1 tablet (0.5 mg total) by mouth 3 (three) times daily as needed for anxiety. 11/06/16  Yes Roselee Nova, MD  doxazosin (CARDURA) 1 MG tablet Take 1 tablet (1 mg total) by mouth daily. 11/24/16  Yes Roselee Nova, MD  isosorbide mononitrate (IMDUR) 30 MG 24 hr tablet Take 1 tablet (30 mg total) by mouth daily. 09/07/16  Yes Roselee Nova, MD  metoprolol tartrate (LOPRESSOR) 25 MG tablet 50 mg po qAM, 25 mg po qHS Patient taking differently: Take 25-50 mg by mouth 2 (two) times daily. 50 mg po qAM, 25 mg po qHS 08/31/16  Yes Roselee Nova, MD  Multiple Vitamins-Minerals (PRESERVISION/LUTEIN PO) Take by mouth 2 (two) times daily.   Yes [provider]  orphenadrine (NORFLEX) 100 MG tablet Take 100 mg by mouth 2 (two) times daily.   Yes [provider]  pantoprazole (PROTONIX) 40 MG tablet Take 1 tablet (40 mg total) by mouth daily. 08/31/16  Yes Roselee Nova, MD  polyethylene glycol [facility] / GLYCOLAX) packet Take 17 g by mouth daily.   Yes [provider]  pravastatin (PRAVACHOL) 40 MG tablet Take 1 tablet (40 mg total) by mouth daily. 11/06/16  Yes Roselee Nova, MD  sertraline (ZOLOFT) 100 MG tablet Take 1.5 tablets (150 mg total) by mouth at bedtime. 10/12/16  Yes Roselee Nova, MD  tiZANidine (ZANAFLEX) 2 MG tablet Take 1 tablet (2 mg total) by mouth every 8 (eight) hours as needed for muscle spasms. 11/30/16 12/07/16 Yes Rochel Brome A, MD  bacitracin 500 UNIT/GM ointment Apply 1 application topically 2 (two) times daily. 11/24/16   Roselee Nova, MD  cyclobenzaprine (FLEXERIL) 10 MG tablet Take 1 tablet (10 mg total) by mouth 3 (three) times daily as needed. Patient not taking: Reported on 12/07/2016 12/04/16 12/09/16  Lannie Fields, PA-C  nitroGLYCERIN (NITROSTAT) 0.4 MG SL tablet Place 0.4 mg under the tongue every 5  (five) minutes as needed for chest pain.    [provider]  traMADol (ULTRAM) 50 MG tablet Take 50 mg by mouth every 6 (six) hours as needed for moderate pain or severe pain.  07/10/16   [provider]    Allergies Sulfamethoxazole-trimethoprim; Baclofen; Nsaids; and Tolmetin  Family History  Problem Relation Age of Onset  . Heart disease Mother   . Stroke Mother   . Diabetes Father   . Dementia Father   . Stroke Sister   . Stroke Brother   . Stroke Sister   . Stroke Sister   . Stroke Brother   . Stroke Brother     Social History Social History  Substance Use Topics  . Smoking status: Former Smoker    Packs/day: 2.00    Years: 7.00    Types: Cigarettes    Quit date: 09/17/1953  . Smokeless tobacco: Never Used  . Alcohol use No    Review  of Systems  Constitutional: No fever/chills Eyes: No visual changes. [name]: No sore throat. Cardiovascular: Denies chest pain. Respiratory: Denies shortness of breath. Gastrointestinal: No abdominal pain.  No nausea, no vomiting.  No diarrhea.  No constipation. Genitourinary: Negative for dysuria. Musculoskeletal: Negative for back pain. Skin: Negative for rash. Neurological: confusion   ____________________________________________   PHYSICAL EXAM:  VITAL SIGNS: ED Triage Vitals  Enc Vitals Group     BP 12/07/16 0524 137/68     Pulse Rate 12/07/16 0524 81     Resp 12/07/16 0524 18     Temp 12/07/16 0524 98.7 F (37.1 C)     Temp Source 12/07/16 0524 Oral     SpO2 12/07/16 0524 94 %     Weight 12/07/16 0516 208 lb (94.3 kg)     Height 12/07/16 0516 '5\' 10"'$  (1.778 m)     Head Circumference --      Peak Flow --      Pain Score --      Pain Loc --      Pain Edu? --      Excl. in [place]? --     Constitutional: Alert and oriented to self and location. Well appearing and in no acute distress. Eyes: Conjunctivae are normal. PERRL. EOMI. Head: Atraumatic. Nose: No congestion/rhinnorhea. Mouth/Throat: Mucous  membranes are moist.  Oropharynx non-erythematous. Cardiovascular: Normal rate, regular rhythm. Grossly normal heart sounds.  Good peripheral circulation. Respiratory: Normal respiratory effort.  No retractions. Lungs CTAB. Gastrointestinal: Soft and nontender. No distention. positive bowel sounds Musculoskeletal: No lower extremity tenderness nor edema.  Neurologic:  Normal speech and language.  Skin:  Skin is warm, dry and intact.  Psychiatric: Mood and affect are normal.   ____________________________________________   LABS (all labs ordered are listed, but only abnormal results are displayed)  Labs Reviewed  CBC - Abnormal; Notable for the following:       Result Value   WBC 12.5 (*)    RBC 3.45 (*)    Hemoglobin 9.6 (*)    HCT 29.0 (*)    RDW 16.1 (*)    All other components within normal limits  BASIC METABOLIC PANEL - Abnormal; Notable for the following:    Glucose, Bld 125 (*)    BUN 24 (*)    Creatinine, Ser 1.65 (*)    GFR calc non Af Amer 35 (*)    GFR calc Af Amer 40 (*)    All other components within normal limits  URINALYSIS, COMPLETE (UACMP) WITH MICROSCOPIC - Abnormal; Notable for the following:    Color, Urine YELLOW (*)    APPearance CLEAR (*)    All other components within normal limits  TROPONIN I   ____________________________________________  EKG  ED ECG REPORT I, Loney Hering, the attending physician, personally viewed and interpreted this ECG.   Date: 12/07/2016  EKG Time: 519  Rate: 76  Rhythm: normal sinus rhythm  Axis: normal  Intervals:none  ST&T Change: none  ____________________________________________  RADIOLOGY  Ct Head Wo Contrast  Result Date: 12/07/2016 CLINICAL DATA:  Status post fall. Hit head. Generalized weakness and confusion. Initial encounter. EXAM: CT HEAD WITHOUT CONTRAST TECHNIQUE: Contiguous axial images were obtained from the base of the skull through the vertex without intravenous contrast. COMPARISON:   CT of the head performed 12/05/2016 FINDINGS: Brain: No evidence of acute infarction, hemorrhage, hydrocephalus, extra-axial collection or mass lesion/mass effect. Prominence of the ventricles and sulci reflects mild to moderate cortical volume loss. Cerebellar atrophy  is noted. Scattered periventricular and subcortical white matter change likely reflects small vessel ischemic microangiopathy. The brainstem and fourth ventricle are within normal limits. The basal ganglia are unremarkable in appearance. The cerebral hemispheres demonstrate grossly normal gray-white differentiation. No mass effect or midline shift is seen. Vascular: No hyperdense vessel or unexpected calcification. Skull: There is no evidence of fracture; visualized osseous structures are unremarkable in appearance. Sinuses/Orbits: The orbits are within normal limits. The paranasal sinuses and mastoid air cells are well-aerated. Other: A soft tissue laceration is noted at the vertex. IMPRESSION: 1. No evidence of traumatic intracranial injury or fracture. 2. Soft tissue laceration at the vertex. 3. Mild to moderate cortical volume loss and scattered small vessel ischemic microangiopathy. Electronically Signed   By: Garald Balding M.D.   On: 12/07/2016 06:31    ____________________________________________   PROCEDURES  Procedure(s) performed: None  Procedures  Critical Care performed: No  ____________________________________________   INITIAL IMPRESSION / ASSESSMENT AND PLAN / ED COURSE  As part of my medical decision making, I reviewed the following data within the electronic MEDICAL RECORD NUMBER Notes from prior ED visits and [place] Controlled Substance Database   This is a 83 year old male who comes into the hospital today with some confusion and altered mental status after a fall 2 days ago.  My differential diagnosis includes intracranial hemorrhage, stroke, urinary tract infection, sepsis, dehydration  I sent the patient for a  CT scan of his head. It was unremarkable and showed no acute intracranial hemorrhage. The patient also received some blood work which was unremarkable. We will add a lactic acid on to the patient's blood. He did receive a 500 mL bolus of normal saline. I will have a conversation with the patient's wife and then reassess the patient.     I did go back into the room to evaluate the patient. The patient at the time was pulling off his stickers in his leads. The patient's wife states that he has been having hallucinations and bad dreams. She reports that she cannot take care of him at their independent living facility. She reports that she her own back and she cannot hold him up and help him out. She reports that something needs to be done because she just can't do it all by herself. Given that history I will order a physical therapy consult on the patient as well as a Education officer, museum consult. The patient's wife states that he started doing this after he started taking the medication. She reports that once when he took baclofen he had these hallucinations as well but this time is much worse. The patient's care will be signed out to Dr.Paduchowski ____________________________________________   FINAL CLINICAL IMPRESSION(S) / ED DIAGNOSES  Final diagnoses:  Altered mental status, unspecified altered mental status type  Adverse effect of drug, initial encounter      NEW MEDICATIONS STARTED DURING THIS VISIT:  New Prescriptions   No medications on file     Note:  This document was prepared using Dragon voice recognition software and may include unintentional dictation errors.    Loney Hering, MD 12/07/16 [phone]

## 2016-12-07 NOTE — ED Notes (Signed)
physical therapy called for consult

## 2016-12-07 NOTE — ED Notes (Signed)
This RN spoke with patient's wife at bedside and daughter via telephone, regarding situation involving urinal. This RN explained to patient's daughter with patient's wife's permission that this RN was not in room at the time and could not speak to situation, however, this RN had offered for Charge RN to speak with patient's wife and she refused. Pt's wife then became visibly upset and stated "well he's got to go to the bathroom and nobody will take him to the bathroom!" This RN explained to patient's wife that this was the first time that it was mentioned that patient had to go to the bathroom and that this RN would take patient to the bathroom ASAP.

## 2016-12-07 NOTE — Clinical Social Work Note (Signed)
CSW and [facility] Malachy Mood met with pt and wife at bedside to address CSW consult for "placement or home health." Pt is from home ([place]) with spouse. CSW an [facility] discussed discharge planning. Pt receives 5 hours of home health services through [name] that is paid for out of pocket.   CSW explained that Medicare will not cover cost of Skilled Nursing placement without a 3 night qualifying stay and, per MD, pt to be discharged today. Spouse states she cannot pay privately for a [place] (SNF) or [place] (ALF). CSW explained and provided resources for Long Term Care (LTC) medicaid. CSW also provided spouse with a SNF listing. Spouse states she has an appointment with the [facility] tomorrow about additional benefits/services. Pt previously received home health PT through [name] and is agreeable to restart [facility] care at home. Spouse to coordinate with family for assistance with transporting pt into the home once discharged. MD updated. RNCM to make referral for home health services. CSW asked that a Education officer, museum be added also, if possible.   CSW signing off as no further Social Work needs identified.   Oretha Ellis, Latanya Presser, [place] Social Worker-ED [phone]

## 2016-12-07 NOTE — H&P (Signed)
[facility] [place] NAME: [name]    MR#:  [id number]  DATE OF BIRTH:  Jan 12, 1924  DATE OF ADMISSION:  12/07/2016  PRIMARY CARE PHYSICIAN: Roselee Nova, MD   REQUESTING/REFERRING PHYSICIAN: Dr. Harvest Dark  CHIEF COMPLAINT:   Chief Complaint  Patient presents with  . Altered Mental Status    HISTORY OF PRESENT ILLNESS:  [name]  is a 83 y.o. male with a known history of anxiety, BPH, carotid stenosis, chronic back pain, coronary artery disease, hyperlipidemia, GERD, hypertension, history of multiple myeloma, obstructive sleep apnea presents to the hospital due to hallucinations confusions and frequent falls. Patient apparently was in the ER 2 days ago after a fall and had a laceration to his scalp. He was having some back pain and was discharged on some Flexeril and was given a Norflex shot while in the ER. As per the wife patient has been more confused and weak and lethargic at home and she was not able to get him up this morning. She brought him back to the emergency room. Patient underwent a metabolic workup including a urinalysis, CT head, basic labs which are all within normal limits. Patient belongs to the [place] but she does not want to take him to the [place] presently. Hospitalist services were contacted for admission and observation due to his altered mental status and difficulty walking.  PAST MEDICAL HISTORY:   Past Medical History:  Diagnosis Date  . Anxiety   . BPH (benign prostatic hypertrophy)   . Carotid stenosis    a. 05/2015 Carotid U/S: mild nonobs atherosclerosis. No need for f/u.  Marland Kitchen Chronic back pain   . Coronary artery disease    a. 06/2013 Cath/PCI: LAD 50-70p (FFR 0.82-->PCI w/ 3.5x24 Promus DES), RCA subtotally occluded w/ L->R collats, LCX 30ost.  . Dyslipidemia   . GERD (gastroesophageal reflux disease)   . Hx of [name]   . Hyperlipidemia   . Hypertension   . Inguinal hernia   . Lightheadedness     a. chronic, somewhat positional.  . 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 Major depression   . Multiple myeloma ([place])   . Pleural effusion    left  . Sleep apnea     PAST SURGICAL HISTORY:   Past Surgical History:  Procedure Laterality Date  . ANTERIOR APPROACH HEMI HIP ARTHROPLASTY Right 04/16/2016   Procedure: ANTERIOR APPROACH HEMI HIP ARTHROPLASTY;  Surgeon: Hessie Knows, MD;  Location: [facility] ORS;  Service: Orthopedics;  Laterality: Right;  . APPENDECTOMY    . CARDIAC CATHETERIZATION  2002  . CARDIAC CATHETERIZATION  06/2013  . COLONOSCOPY    . FEMUR FRACTURE SURGERY Left    with rod  . HIP FRACTURE SURGERY    . JOINT REPLACEMENT     right partial knee replacement    SOCIAL HISTORY:   Social History  Substance Use Topics  . Smoking status: Former Smoker    Packs/day: 2.00    Years: 7.00    Types: Cigarettes    Quit date: 09/17/1953  . Smokeless tobacco: Never Used  . Alcohol use No    FAMILY HISTORY:   Family History  Problem Relation Age of Onset  . Heart disease Mother   . Stroke Mother   . Diabetes Father   . Dementia Father   . Stroke Sister   . Stroke Brother   . Stroke Sister   . Stroke Sister   . Stroke Brother   .  Stroke Brother     DRUG ALLERGIES:   Allergies  Allergen Reactions  . Sulfamethoxazole-Trimethoprim Nausea Only and Other (See Comments)  . Baclofen Other (See Comments)    Confusion and weakness  . Nsaids     Avoids due to liver.  . Tolmetin Other (See Comments)    Avoids due to liver.    REVIEW OF SYSTEMS:   Review of Systems  Constitutional: Negative for fever and weight loss.  HENT: Negative for congestion, nosebleeds and tinnitus.   Eyes: Negative for blurred vision, double vision and redness.  Respiratory: Negative for cough, hemoptysis and shortness of breath.   Cardiovascular: Negative for chest pain, orthopnea, leg swelling and PND.  Gastrointestinal: Negative for abdominal pain, diarrhea, melena, nausea and vomiting.   Genitourinary: Negative for dysuria, hematuria and urgency.  Musculoskeletal: Positive for back pain and falls. Negative for joint pain.  Neurological: Positive for weakness. Negative for dizziness, tingling, sensory change, focal weakness, seizures and headaches.  Endo/Heme/Allergies: Negative for polydipsia. Does not bruise/bleed easily.  Psychiatric/Behavioral: Negative for depression and memory loss. The patient is not nervous/anxious.     MEDICATIONS AT HOME:   Prior to Admission medications   Medication Sig Start Date End Date Taking? Authorizing Provider  acetaminophen (TYLENOL) 500 MG tablet Take 2 tablets (1,000 mg total) by mouth every 6 (six) hours. 04/17/16  Yes Auburn Bilberry, MD  cephALEXin (KEFLEX) 500 MG capsule Take 1 capsule (500 mg total) by mouth 4 (four) times daily. 12/05/16 12/15/16 Yes Rockne Menghini, MD  clonazePAM (KLONOPIN) 0.5 MG tablet Take 1 tablet (0.5 mg total) by mouth 3 (three) times daily as needed for anxiety. 11/06/16  Yes Ellyn Hack, MD  doxazosin (CARDURA) 1 MG tablet Take 1 tablet (1 mg total) by mouth daily. 11/24/16  Yes Ellyn Hack, MD  isosorbide mononitrate (IMDUR) 30 MG 24 hr tablet Take 1 tablet (30 mg total) by mouth daily. 09/07/16  Yes Ellyn Hack, MD  metoprolol tartrate (LOPRESSOR) 25 MG tablet 50 mg po qAM, 25 mg po qHS Patient taking differently: Take 25-50 mg by mouth 2 (two) times daily. 50 mg po qAM, 25 mg po qHS 08/31/16  Yes Ellyn Hack, MD  Multiple Vitamins-Minerals (PRESERVISION/LUTEIN PO) Take by mouth 2 (two) times daily.   Yes [provider]  orphenadrine (NORFLEX) 100 MG tablet Take 100 mg by mouth 2 (two) times daily.   Yes [provider]  pantoprazole (PROTONIX) 40 MG tablet Take 1 tablet (40 mg total) by mouth daily. 08/31/16  Yes Ellyn Hack, MD  polyethylene glycol [facility] / GLYCOLAX) packet Take 17 g by mouth daily.   Yes [provider]  pravastatin  (PRAVACHOL) 40 MG tablet Take 1 tablet (40 mg total) by mouth daily. 11/06/16  Yes Ellyn Hack, MD  sertraline (ZOLOFT) 100 MG tablet Take 1.5 tablets (150 mg total) by mouth at bedtime. 10/12/16  Yes Ellyn Hack, MD  tiZANidine (ZANAFLEX) 2 MG tablet Take 1 tablet (2 mg total) by mouth every 8 (eight) hours as needed for muscle spasms. 11/30/16 12/07/16 Yes Velta Addison A, MD  bacitracin 500 UNIT/GM ointment Apply 1 application topically 2 (two) times daily. 11/24/16   Ellyn Hack, MD  cyclobenzaprine (FLEXERIL) 10 MG tablet Take 1 tablet (10 mg total) by mouth 3 (three) times daily as needed. Patient not taking: Reported on 12/07/2016 12/04/16 12/09/16  Orvil Feil, PA-C  lidocaine (LIDODERM)  5 % Place 1 patch onto the skin daily. Remove & Discard patch within 12 hours or as directed by MD 12/07/16 12/07/17  Harvest Dark, MD  nitroGLYCERIN (NITROSTAT) 0.4 MG SL tablet Place 0.4 mg under the tongue every 5 (five) minutes as needed for chest pain.    [provider]  traMADol (ULTRAM) 50 MG tablet Take 50 mg by mouth every 6 (six) hours as needed for moderate pain or severe pain.  07/10/16   [provider]      VITAL SIGNS:  Blood pressure (!) 136/55, pulse 95, temperature 98.5 F (36.9 C), temperature source Oral, resp. rate 19, height '5\' 10"'$  (1.778 m), weight 94.3 kg (208 lb), SpO2 96 %.  PHYSICAL EXAMINATION:  Physical Exam  GENERAL:  83 y.o.-year-old patient lying in the bed in no acute distress.  EYES: Pupils equal, round, reactive to light and accommodation. No scleral icterus. Extraocular muscles intact.  HEENT: + Scalp laceration with staples and dressing in place, normocephalic. Oropharynx and nasopharynx clear. No oropharyngeal erythema, moist oral mucosa  NECK:  Supple, no jugular venous distention. No thyroid enlargement, no tenderness.  LUNGS: Normal breath sounds bilaterally, no wheezing, rales, rhonchi. No use of accessory muscles of  respiration.  CARDIOVASCULAR: S1, S2 RRR. No murmurs, rubs, gallops, clicks.  ABDOMEN: Soft, nontender, nondistended. Bowel sounds present. No organomegaly or mass.  EXTREMITIES: No pedal edema, cyanosis, or clubbing. + 2 pedal & radial pulses b/l.   NEUROLOGIC: Cranial nerves II through XII are intact. No focal Motor or sensory deficits appreciated b/l. Globally weak. PSYCHIATRIC: The patient is alert and oriented x 3.  SKIN: No obvious rash, lesion, or ulcer.   LABORATORY PANEL:   CBC  Recent Labs Lab 12/07/16 0531  WBC 12.5*  HGB 9.6*  HCT 29.0*  PLT 198   ------------------------------------------------------------------------------------------------------------------  Chemistries   Recent Labs Lab 12/07/16 0531  NA 136  K 4.4  CL 103  CO2 25  GLUCOSE 125*  BUN 24*  CREATININE 1.65*  CALCIUM 9.2   ------------------------------------------------------------------------------------------------------------------  Cardiac Enzymes  Recent Labs Lab 12/07/16 0531  TROPONINI <0.03   ------------------------------------------------------------------------------------------------------------------  RADIOLOGY:  Ct Head Wo Contrast  Result Date: 12/07/2016 CLINICAL DATA:  Status post fall. Hit head. Generalized weakness and confusion. Initial encounter. EXAM: CT HEAD WITHOUT CONTRAST TECHNIQUE: Contiguous axial images were obtained from the base of the skull through the vertex without intravenous contrast. COMPARISON:  CT of the head performed 12/05/2016 FINDINGS: Brain: No evidence of acute infarction, hemorrhage, hydrocephalus, extra-axial collection or mass lesion/mass effect. Prominence of the ventricles and sulci reflects mild to moderate cortical volume loss. Cerebellar atrophy is noted. Scattered periventricular and subcortical white matter change likely reflects small vessel ischemic microangiopathy. The brainstem and fourth ventricle are within normal limits. The  basal ganglia are unremarkable in appearance. The cerebral hemispheres demonstrate grossly normal gray-white differentiation. No mass effect or midline shift is seen. Vascular: No hyperdense vessel or unexpected calcification. Skull: There is no evidence of fracture; visualized osseous structures are unremarkable in appearance. Sinuses/Orbits: The orbits are within normal limits. The paranasal sinuses and mastoid air cells are well-aerated. Other: A soft tissue laceration is noted at the vertex. IMPRESSION: 1. No evidence of traumatic intracranial injury or fracture. 2. Soft tissue laceration at the vertex. 3. Mild to moderate cortical volume loss and scattered small vessel ischemic microangiopathy. Electronically Signed   By: Garald Balding M.D.   On: 12/07/2016 06:31     IMPRESSION AND PLAN:   83 year old  male with past medical history of multiple myeloma, hypertension, hyperlipidemia, GERD, urinary artery disease, chronic back pain, carotid stenosis, anxiety who presents to the hospital due to altered mental status, hallucinations and frequent falls.  1. Altered mental status-etiology unclear but suspected to be metabolic encephalopathy secondary to polypharmacy. -Patient was just in the ER due to back pain and a fall and discharge on some Flexeril and given Norflex in the ER. This is probably contributed to patient's hallucinations and confusion. I will hold those medications for now. Patient CT head is negative for acute pathology presently. No other evidence of acute infectious source. Follow mental status. Patient has no previous history of dementia.  2. Frequent falls/weakness-etiology unclear and suspected to be secondary to deconditioning and back pain. -We'll get a physical therapy consult to assess mobility. Patient may need placement.  3. Essential hypertension-continue metoprolol, Imdur.  4. Anxiety-continue Klonopin as needed.  5. Hyperlipidemia-continue Pravachol.  6.  Depression-continue Zoloft.  7. GERD-continue Protonix.    All the records are reviewed and case discussed with ED provider. Management plans discussed with the patient, family and they are in agreement.  CODE STATUS: Full code  TOTAL TIME TAKING CARE OF THIS PATIENT: 45 minutes.    Henreitta Leber M.D on 12/07/2016 at 4:13 PM  Between 7am to 6pm - Pager - [phone]  After 6pm go to [email or web address] - password [email or web address]  [facility]  Office  [phone]  CC: Primary care physician; Roselee Nova, MD

## 2016-12-07 NOTE — ED Notes (Signed)
This RN to bedside at this time to introduce self to patient and family. Pt's wife states that the previous 2 nurses were the most uncompassionate nurses that she had ever met, pt's wife states that they made patient use urinal and "put a diaper on him". Pt's wife appears very upset at this time. This RN offered to have charge RN speak with patient and wife, pt's wife refused stating "no, in my experience they tend to stick together". Brayton Layman, Agricultural consultant made aware of situation.

## 2016-12-07 NOTE — ED Triage Notes (Signed)
Patient brought in by ems from home. Patient fell 2 days ago and hit his head. Wife reports that patient has had increase in weakness and has been confused since the fall. Patient was started on norflex and cephalexin 2 days ago.

## 2016-12-07 NOTE — Evaluation (Signed)
Physical Therapy Evaluation Patient Details Name: [name] MRN: [id number] DOB: April 23, 1923 Today's Date: 12/07/2016   History of Present Illness  presented to ER secondary to progressive weakness, AMS/hallucinations since fall 2 days prior.  Wife unable to manage care at home. Of note, head lac sustained during fall (head CT negative for acute injury), sutured in ER during previous presentation.  Reporting consistent back pain (imaging negative for acute injury), unchanged with most recent fall.  Clinical Impression  Upon evaluation, patient alert and oriented to self, general location; difficulty comprehending new information or processing more complex situations.  Bilat UE/LE generally weak and deconditioned, with strength at least 3- to 4-/5 throughout.  Currently requiring mod assist for bed mobility; mod assist with RW for sit/stand, short-distance gait (5).  Very poor standing balance and righting reactions; very high risk for falls/self injury.  Unsafe to attempt without steady +1 assist at all times. Would benefit from skilled PT to address above deficits and promote optimal return to PLOF; recommend transition to STR upon discharge from acute hospitalization.  **If patient/wife opt for discharge home, recommend 24 hour physical assist (other than wife) with HHPT/OT/RN/aide to maximize support in home environment.     Follow Up Recommendations SNF    Equipment Recommendations       Recommendations for Other Services       Precautions / Restrictions Precautions Precautions: Fall Restrictions Weight Bearing Restrictions: No      Mobility  Bed Mobility Overal bed mobility: Needs Assistance Bed Mobility: Supine to Sit;Sit to Supine     Supine to sit: Mod assist Sit to supine: Mod assist   General bed mobility comments: assist for LE management, truncal elevation; attempted instruction in log rolling, but patient unable to comprehend, comply due to  AMS  Transfers Overall transfer level: Needs assistance Equipment used: Rolling walker (2 wheeled) Transfers: Sit to/from Stand Sit to Stand: Mod assist         General transfer comment: excessive posterior weight shift, difficulty maintaining feet in contact with floor (tend to slide forward); relies heavily on UEs to pull self to walker  Ambulation/Gait Ambulation/Gait assistance: Mod assist Ambulation Distance (Feet): 5 Feet Assistive device: Rolling walker (2 wheeled)       General Gait Details: broad BOS, choppy, staggered steps with excessive posterior weight shift; poor balance; unsafe to attempt without hands-on assist at all times  Stairs            Wheelchair Mobility    Modified Rankin (Stroke Patients Only)       Balance Overall balance assessment: Needs assistance Sitting-balance support: No upper extremity supported;Feet supported Sitting balance-Leahy Scale: Fair     Standing balance support: Bilateral upper extremity supported Standing balance-Leahy Scale: Poor                               Pertinent Vitals/Pain Pain Assessment: Faces Faces Pain Scale: Hurts whole lot Pain Location: back Pain Descriptors / Indicators: Aching;Grimacing;Guarding Pain Intervention(s): Limited activity within patient's tolerance;Monitored during session;Repositioned;Patient requesting pain meds-RN notified    Home Living Family/patient expects to be discharged to:: Private residence Living Arrangements: Spouse/significant other Available Help at Discharge: Family;Available 24 hours/day Type of Home: Independent living facility       Home Layout: One level Home Equipment: Shower seat;Hand held Tourist information centre manager - 4 wheels      Prior Function Level of Independence: Independent with assistive device(s)  Comments: Mod indep for ADLs, household and limited community mobilization with [phone]; staff/wife assist with ADLs as needed.  Does  endorse multiple fall history, at least 4-5 in previous year (2-3 in previous two weeks).     Hand Dominance        Extremity/Trunk Assessment   Upper Extremity Assessment Upper Extremity Assessment: Overall WFL for tasks assessed (grossly at least 4-/5 throughout)    Lower Extremity Assessment Lower Extremity Assessment: Generalized weakness (grossly at least 3- to 4-/5 throughout)       Communication   Communication: HOH  Cognition Arousal/Alertness: Awake/alert Behavior During Therapy: WFL for tasks assessed/performed                                   General Comments: oriented to self, general location; unaware fully of situation, deficits and need for assist      General Comments      Exercises Other Exercises Other Exercises: Sit/stand x3 with RW, mod assist-significant assist for forward trunk lean/anterior weight translation; foot position/stabilization; standing balance Other Exercises: Forward/backward and lateral stepping with RW x5' each direction, mod assist for posterior weight shift, overall balance   Assessment/Plan    PT Assessment Patient needs continued PT services  PT Problem List Decreased strength;Decreased range of motion;Decreased activity tolerance;Decreased balance;Decreased mobility;Decreased coordination;Decreased cognition;Decreased knowledge of use of DME;Decreased safety awareness;Decreased knowledge of precautions;Pain;Decreased skin integrity       PT Treatment Interventions DME instruction;Gait training;Functional mobility training;Therapeutic activities;Therapeutic exercise;Balance training;Patient/family education    PT Goals (Current goals can be found in the Care Plan section)  Acute Rehab PT Goals Patient Stated Goal: to get some help PT Goal Formulation: With patient/family Time For Goal Achievement: 12/21/16 Potential to Achieve Goals: Fair    Frequency Min 2X/week   Barriers to discharge Decreased caregiver  support      Co-evaluation               AM-PAC PT "6 Clicks" Daily Activity  Outcome Measure Difficulty turning over in bed (including adjusting bedclothes, sheets and blankets)?: Unable Difficulty moving from lying on back to sitting on the side of the bed? : Unable Difficulty sitting down on and standing up from a chair with arms (e.g., wheelchair, bedside commode, etc,.)?: Unable Help needed moving to and from a bed to chair (including a wheelchair)?: A Lot Help needed walking in hospital room?: A Lot Help needed climbing 3-5 steps with a railing? : Total 6 Click Score: 8    End of Session Equipment Utilized During Treatment: Gait belt Activity Tolerance: Patient limited by pain Patient left: in bed;with call bell/phone within reach;with family/visitor present Nurse Communication: Mobility status PT Visit Diagnosis: Difficulty in walking, not elsewhere classified (R26.2);Muscle weakness (generalized) (M62.81);Pain;Unsteadiness on feet (R26.81)    Time: 3154-0086 PT Time Calculation (min) (ACUTE ONLY): 39 min   Charges:   PT Evaluation $PT Eval Moderate Complexity: 1 Mod PT Treatments $Therapeutic Activity: 23-37 mins   PT G Codes:   PT G-Codes **NOT FOR INPATIENT CLASS** Functional Assessment Tool Used: AM-PAC 6 Clicks Basic Mobility Functional Limitation: Mobility: Walking and moving around Mobility: Walking and Moving Around Current Status (P6195): At least 60 percent but less than 80 percent impaired, limited or restricted Mobility: Walking and Moving Around Goal Status [phone]): At least 20 percent but less than 40 percent impaired, limited or restricted    Manas Hickling H. Owens Shark, PT, DPT, NCS 12/07/16,  10:15 AM [phone]

## 2016-12-07 NOTE — ED Notes (Signed)
Declination of transfer to [place] signed by patient's wife and placed in chart. Dr. Verdell Carmine at bedside at this time.

## 2016-12-07 NOTE — Clinical Social Work Note (Signed)
Clinical Social Work Assessment  Patient Details  Name: [name] MRN: [id number] Date of Birth: 1923/10/20  Date of referral:  12/07/16               Reason for consult:  Discharge Planning, Facility Placement                Permission sought to share information with:  Family Supports Permission granted to share information::  Yes, Verbal Permission Granted  Name::     [name]  Agency::     Relationship::  Spouse  Contact Information:  [phone]  Housing/Transportation Living arrangements for the past 2 months:  [place] of Information:  Patient, Spouse Patient Interpreter Needed:  None Criminal Activity/Legal Involvement Pertinent to Current Situation/Hospitalization:  No - Comment as needed Significant Relationships:  Adult Children, Spouse Lives with:  Spouse Do you feel safe going back to the place where you live?  Yes Need for family participation in patient care:  Yes (Comment)  Care giving concerns: Pt from home with spouse who reportedly can no longer care for pt.    Social Worker assessment / plan:  CSW and RNCM Malachy Mood met with pt earlier today. See note copied from earlier today: "CSW and [facility] Malachy Mood met with pt and wife at bedside to address CSW consult for "placement or home health." Pt is from home ([place]) with spouse. CSW an [facility] discussed discharge planning. Pt receives 5 hours of home health services through [name] that is paid for out of pocket. CSW explained that Medicare will not cover cost of Skilled Nursing placement without a 3 night qualifying stay and, per MD, pt to be discharged today. Spouse states she cannot pay privately for a [place] (SNF) or [place] (ALF). CSW explained and provided resources for Long Term Care (LTC) medicaid. CSW also provided spouse with a SNF listing. Spouse states she has an appointment with the [facility] tomorrow about additional  benefits/services. Pt previously received home health PT through [name] and is agreeable to restart [facility] care at home. Spouse to coordinate with family for assistance with transporting pt into the home once discharged. MD updated. RNCM to make referral for home health services. CSW asked that a Education officer, museum be added also, if possible. CSW signing off as no further Social Work needs identified."  CSW updated at 3:20pm that pt will be admitted to the hospital under observation for hallucinations and safety concerns for pt needing a 3 person assist for transport, per note. However, this obs admission will not allow for Medicare to pay for placement at a [place]. CSW continuing to follow for discharge needs.     Employment status:  Retired Forensic scientist:  Medicare PT Recommendations:  [place] / Referral to community resources:  [place]  Patient/Family's Response to care:  Spouse is pleased pt to be admitted.   Patient/Family's Understanding of and Emotional Response to Diagnosis, Current Treatment, and Prognosis:  Pt experiencing hallucinations. Spouse is aware of pt's current medical state and is unhappy with resources available to pt for placement.   Emotional Assessment Appearance:  Appears stated age Attitude/Demeanor/Rapport:  Other (Appropriate) Affect (typically observed):  Accepting Orientation:   (Per note, Pt hallucinating) Alcohol / Substance use:  Other Psych involvement (Current and /or in the community):  No (Comment)  Discharge Needs  Concerns to be addressed:  Discharge Planning Concerns Readmission within the last 30  days:  No Current discharge risk:  Dependent with Mobility Barriers to Discharge:  Continued Medical Work up   [employer], LCSW 12/07/2016, 5:05 PM

## 2016-12-07 NOTE — ED Provider Notes (Addendum)
-----------------------------------------   10:51 AM on 12/07/2016 -----------------------------------------  Patient has been seen by the clinical social worker as well as case management.  [facility] is [facility]'s [facility].  She has been seen by physical therapy.  They recommend sniff placement.  Case management and social work have discussed this with the patient and wife, they do not have the resources for a nursing facility currently.  Patient does not meet inpatient criteria at this time.  We are able to arrange for home physical therapy.  I have seen the patient, discussed this with the wife.  I have filled out the patient's face-to-face home health needs forms.  We will discharge the patient home.  Wife agreeable.   Harvest Dark, MD 12/07/16 1134  ----------------------------------------- 2:56 PM on 12/07/2016 -----------------------------------------  Patient took 3 people to assist 10 feet to the toilet.  Patient continues to hallucinate, wife says he just thought he was at home in the living room.  Given the continued hallucinations thought to be due to his medications and significant assistance needed, I once again offered admission to the hospital.  The wife spoke to family and is now agreeable to admission even if it is only observation stay.     Harvest Dark, MD 12/07/16 [phone]

## 2016-12-07 NOTE — ED Notes (Signed)
MD notified that per patient's wife, pt's wife wants him to be kept observation over night.

## 2016-12-07 NOTE — ED Notes (Signed)
This RN and 2 [facility] paramedics assisted patient to the bathroom. Pt refused bedpan and urinal. Pt is noted to be highly unsteady on his feet with maximum 3 person assist. Pt is noted to be stumbling and his feet are noted to go in different directions while walking. Pt states it is because he does not have a walker. Pt is noted to get agitated while standing, does not listen or follow direction by this RN.

## 2016-12-07 NOTE — ED Notes (Signed)
Report called to shea rn floor nurse 

## 2016-12-08 DIAGNOSIS — R4182 Altered mental status, unspecified: Secondary | ICD-10-CM | POA: Diagnosis not present

## 2016-12-08 MED ORDER — METOPROLOL TARTRATE 50 MG PO TABS
50.0000 mg | ORAL_TABLET | Freq: Every day | ORAL | Status: DC
  Administered 2016-12-08 – 2016-12-09 (×2): 50 mg via ORAL
  Filled 2016-12-08 (×2): qty 1

## 2016-12-08 MED ORDER — CALCIUM CARBONATE ANTACID 500 MG PO CHEW
1.0000 | CHEWABLE_TABLET | Freq: Two times a day (BID) | ORAL | Status: DC | PRN
  Administered 2016-12-08: 22:00:00 200 mg via ORAL
  Filled 2016-12-08: qty 1

## 2016-12-08 MED ORDER — CEPHALEXIN 500 MG PO CAPS
500.0000 mg | ORAL_CAPSULE | Freq: Three times a day (TID) | ORAL | Status: DC
  Administered 2016-12-08 – 2016-12-09 (×4): 500 mg via ORAL
  Filled 2016-12-08 (×4): qty 1

## 2016-12-08 MED ORDER — METOPROLOL TARTRATE 25 MG PO TABS
25.0000 mg | ORAL_TABLET | Freq: Every day | ORAL | Status: DC
  Administered 2016-12-08 – 2016-12-09 (×2): 25 mg via ORAL
  Filled 2016-12-08: qty 1

## 2016-12-08 MED ORDER — ALUM & MAG HYDROXIDE-SIMETH 200-200-20 MG/5ML PO SUSP
15.0000 mL | Freq: Once | ORAL | Status: AC
  Administered 2016-12-08: 15 mL via ORAL
  Filled 2016-12-08: qty 30

## 2016-12-08 NOTE — Progress Notes (Signed)
Pt complaining of heartburn after taking antibiotic tab.  Gave Tums but pt continues to complain of heartburn and wants Maalox.  Ordered Maalox per standing orders. Dorna Bloom RN

## 2016-12-08 NOTE — Progress Notes (Signed)
Physical Therapy Treatment Patient Details Name: [name] MRN: [id number] DOB: 1923/10/02 Today's Date: 12/08/2016    History of Present Illness presented to ER secondary to progressive weakness, AMS/hallucinations since fall 2 days prior.  Wife unable to manage care at home. Of note, head lac sustained during fall (head CT negative for acute injury), sutured in ER during previous presentation.  Reporting consistent back pain (imaging negative for acute injury), unchanged with most recent fall.    PT Comments    Pt in bed willing and ready to ambulate this am.  Pt required moderate assist x 1 to get to the edge of bed with moderate verbal and tactile cues for sequencing and an overall awkward and inefficient movement pattern.  PT with complaints of dizziness upon sitting which relieved mostly with time sitting at edge of bed.  Stood with min/mod a x 2 for safety due to feet sliding on floor.  Pt was given time to stand prior to gait for dizziness to decline.  Pt was able to increase his ambulation distance slightly this session to 10', making it to the doorway before fatigue.  A recliner follow was obtained for safety as he relied heavily on walker, had an overall unsteady gait patten and poor balance.  Pt reported fatigue causing him to request the recliner along with back pain.  Pt is generally unsteady and unsafe to ambulate on his own or with assist of wife.  She would have difficulty transferring to wheelchair for chair level activities and bed mobility skills.  He will require assist from a skilled person upon discharge for safety with mobility.   Follow Up Recommendations  SNF     Equipment Recommendations       Recommendations for Other Services       Precautions / Restrictions Precautions Precautions: Fall Restrictions Weight Bearing Restrictions: No    Mobility  Bed Mobility Overal bed mobility: Needs Assistance Bed Mobility: Supine to Sit     Supine to sit: Mod  assist     General bed mobility comments: Seemed more alert and orientated this session but continued with difficulty and moderate assist with awkward movements to get sitting on edge of bed.  Transfers Overall transfer level: Needs assistance Equipment used: Rolling walker (2 wheeled)   Sit to Stand: Mod assist;+2 safety/equipment;Min assist         General transfer comment: +2 assist for safety with gait this session.  Feet slipping on floor during attempts at standing.  Ambulation/Gait Ambulation/Gait assistance: Mod assist Ambulation Distance (Feet): 10 Feet Assistive device: Rolling walker (2 wheeled) Gait Pattern/deviations: Step-through pattern;Decreased step length - right;Decreased step length - left;Wide base of support   Gait velocity interpretation: <1.8 ft/sec, indicative of risk for recurrent falls General Gait Details: heavy reliance on walker, reached for end of bed for support during rest break stating it felt more stable than walker.   Stairs            Wheelchair Mobility    Modified Rankin (Stroke Patients Only)       Balance Overall balance assessment: Needs assistance Sitting-balance support: No upper extremity supported;Feet supported Sitting balance-Leahy Scale: Fair     Standing balance support: Bilateral upper extremity supported Standing balance-Leahy Scale: Poor                              Cognition Arousal/Alertness: Awake/alert Behavior During Therapy: WFL for tasks assessed/performed Overall Cognitive  Status: Within Functional Limits for tasks assessed                                        Exercises      General Comments        Pertinent Vitals/Pain Pain Assessment: 0-10 Pain Score: 6  Pain Location: back Pain Descriptors / Indicators: Aching;Grimacing;Guarding Pain Intervention(s): Limited activity within patient's tolerance;Patient requesting pain meds-RN notified    Home Living                       Prior Function            PT Goals (current goals can now be found in the care plan section) Progress towards PT goals: Progressing toward goals    Frequency    Min 2X/week      PT Plan Current plan remains appropriate    Co-evaluation              AM-PAC PT "6 Clicks" Daily Activity  Outcome Measure  Difficulty turning over in bed (including adjusting bedclothes, sheets and blankets)?: Unable Difficulty moving from lying on back to sitting on the side of the bed? : Unable Difficulty sitting down on and standing up from a chair with arms (e.g., wheelchair, bedside commode, etc,.)?: Unable Help needed moving to and from a bed to chair (including a wheelchair)?: A Lot Help needed walking in hospital room?: A Lot Help needed climbing 3-5 steps with a railing? : Total 6 Click Score: 8    End of Session Equipment Utilized During Treatment: Gait belt Activity Tolerance: Patient limited by pain Patient left: in chair;with chair alarm set;with call bell/phone within reach Nurse Communication: Patient requests pain meds;Mobility status       Time: 2671-2458 PT Time Calculation (min) (ACUTE ONLY): 15 min  Charges:  $Gait Training: 8-22 mins                    G Codes:       Chesley Noon, PTA 12/08/16, 9:48 AM

## 2016-12-08 NOTE — Care Management Obs Status (Signed)
MEDICARE OBSERVATION STATUS NOTIFICATION   Patient Details  Name: [name] MRN: [id number] Date of Birth: 13-Feb-1924   Medicare Observation Status Notification Given:  Yes. Wife signed    Shelbie Ammons, RN 12/08/2016, 10:58 AM

## 2016-12-08 NOTE — Progress Notes (Signed)
Pt w/ c/o "pain all over" tonight.  Administered prn tylenol and baclofen and repositioned pt off his back.  Pt then requested and received prn klonopin.  After this, pt rested well t/o the night.  Confused to location and date for entire shift.  He kept thinking he was at his assisted living facility with his wife.  Head wound continues to ooze sanguineous drainage.  Dressing changed twice.

## 2016-12-08 NOTE — Progress Notes (Signed)
[place] at [facility]                                                                                                                                                                                  Patient Demographics   Sholes, is a 83 y.o. male, DOB - 1923/09/16, MAU:[id number]  Admit date - 12/07/2016   Admitting Physician Henreitta Leber, MD  Outpatient Primary MD for the patient is Roselee Nova, MD   LOS - 0  Subjective: Patient mental status back to baseline. Currently denies any symptoms his wife at bedside    Review of Systems:   CONSTITUTIONAL: No documented fever. No fatigue, weakness. No weight gain, no weight loss.  EYES: No blurry or double vision.  [name]: No tinnitus. No postnasal drip. No redness of the oropharynx.  RESPIRATORY: No cough, no wheeze, no hemoptysis. No dyspnea.  CARDIOVASCULAR: No chest pain. No orthopnea. No palpitations. No syncope.  GASTROINTESTINAL: No nausea, no vomiting or diarrhea. No abdominal pain. No melena or hematochezia.  GENITOURINARY: No dysuria or hematuria.  ENDOCRINE: No polyuria or nocturia. No heat or cold intolerance.  HEMATOLOGY: No anemia. No bruising. No bleeding.  INTEGUMENTARY: No rashes. No lesions.  MUSCULOSKELETAL: No arthritis. No swelling. No gout.  NEUROLOGIC: No numbness, tingling, or ataxia. No seizure-type activity.  PSYCHIATRIC: No anxiety. No insomnia. No ADD.    Vitals:   Vitals:   12/07/16 1759 12/07/16 1936 12/08/16 0459 12/08/16 1327  BP: (!) 152/59 (!) 133/50 (!) 113/54 (!) 97/52  Pulse: 87 84 73 71  Resp: '20 16 17   '$ Temp: 98.1 F (36.7 C) 98.9 F (37.2 C) 98.4 F (36.9 C) 98.1 F (36.7 C)  TempSrc: Oral Oral Oral Oral  SpO2: 97% 96% 97% 97%  Weight: 181 lb 1.6 oz (82.1 kg)     Height: '5\' 10"'$  (1.778 m)       Wt Readings from Last 3 Encounters:  12/07/16 181 lb 1.6 oz (82.1 kg)  12/05/16 208 lb (94.3 kg)  12/04/16 196 lb (88.9 kg)     Intake/Output  Summary (Last 24 hours) at 12/08/16 1540 Last data filed at 12/08/16 1404  Gross per 24 hour  Intake              200 ml  Output              300 ml  Net             -100 ml    Physical Exam:   GENERAL: Pleasant-appearing in no apparent distress.  HEAD, EYES, EARS, NOSE AND THROAT: Atraumatic, normocephalic. Extraocular muscles are intact. Pupils equal  and reactive to light. Sclerae anicteric. No conjunctival injection. No oro-pharyngeal erythema.  NECK: Supple. There is no jugular venous distention. No bruits, no lymphadenopathy, no thyromegaly.  HEART: Regular rate and rhythm,. No murmurs, no rubs, no clicks.  LUNGS: Clear to auscultation bilaterally. No rales or rhonchi. No wheezes.  ABDOMEN: Soft, flat, nontender, nondistended. Has good bowel sounds. No hepatosplenomegaly appreciated.  EXTREMITIES: No evidence of any cyanosis, clubbing, or peripheral edema.  +2 pedal and radial pulses bilaterally.  NEUROLOGIC: The patient is alert, awake, and oriented x3 with no focal motor or sensory deficits appreciated bilaterally.  SKIN: Moist and warm with no rashes appreciated.  Psych: Not anxious, depressed LN: No inguinal LN enlargement    Antibiotics   Anti-infectives    Start     Dose/Rate Route Frequency Ordered Stop   12/08/16 1400  cephALEXin (KEFLEX) capsule 500 mg     500 mg Oral Every 8 hours 12/08/16 1149        Medications   Scheduled Meds: . bacitracin  1 application Topical BID  . cephALEXin  500 mg Oral Q8H  . doxazosin  1 mg Oral Daily  . enoxaparin (LOVENOX) injection  40 mg Subcutaneous Q24H  . isosorbide mononitrate  30 mg Oral Daily  . lidocaine  1 patch Transdermal Q24H  . metoprolol tartrate  50 mg Oral Daily   And  . metoprolol tartrate  25 mg Oral Q2200  . pantoprazole  40 mg Oral Daily  . polyethylene glycol  17 g Oral Daily  . pravastatin  40 mg Oral QPC supper  . sertraline  150 mg Oral QHS   Continuous Infusions: PRN Meds:.acetaminophen **OR**  acetaminophen, clonazePAM, nitroGLYCERIN, ondansetron **OR** ondansetron (ZOFRAN) IV, tiZANidine, traMADol   Data Review:   Micro Results No results found for this or any previous visit (from the past 240 hour(s)).  Radiology Reports Dg Thoracic Spine 2 View  Result Date: 12/04/2016 CLINICAL DATA:  [place] 2 weeks ago.  Back pain. EXAM: THORACIC SPINE 2 VIEWS; LUMBAR SPINE - COMPLETE 4+ VIEW COMPARISON:  Lumbar spine radiographs September 06, 2014 and MRI of the lumbar spine September 26, 2014 FINDINGS: Thoracic spine: Thoracic vertebral bodies intact and aligned with maintenance of thoracic kyphosis. Intervertebral disc heights preserved, multilevel ventral bridging osteophytes. No destructive bony lesions. Osteopenia. Prevertebral and paraspinal soft tissue planes are non-suspicious. Calcified aortic arch. Lumbar spine: Mild-to-moderate old, similar L4 compression fracture. Remaining lumbar vertebral bodies intact. Mild L5-S1 disc height loss with multilevel endplate spurring compatible with degenerative discs. Moderate lower lumbar facet arthropathy. Osteopenia without destructive bony lesions. Status post bilateral total hip replacements. Aortoiliac atherosclerosis. Sacroiliac joints are symmetric. IMPRESSION: Thoracic spine: Degenerative change without fracture deformity or malalignment. Lumbar spine: Old mild-to-moderate L4 compression fracture. No acute fracture deformity or malalignment. Stable degenerative change. Aortic Atherosclerosis (ICD10-I70.0). Electronically Signed   By: Elon Alas M.D.   On: 12/04/2016 16:16   Dg Lumbar Spine Complete  Result Date: 12/04/2016 CLINICAL DATA:  [place] 2 weeks ago.  Back pain. EXAM: THORACIC SPINE 2 VIEWS; LUMBAR SPINE - COMPLETE 4+ VIEW COMPARISON:  Lumbar spine radiographs September 06, 2014 and MRI of the lumbar spine September 26, 2014 FINDINGS: Thoracic spine: Thoracic vertebral bodies intact and aligned with maintenance of thoracic kyphosis. Intervertebral  disc heights preserved, multilevel ventral bridging osteophytes. No destructive bony lesions. Osteopenia. Prevertebral and paraspinal soft tissue planes are non-suspicious. Calcified aortic arch. Lumbar spine: Mild-to-moderate old, similar L4 compression fracture. Remaining lumbar vertebral bodies intact. Mild L5-S1  disc height loss with multilevel endplate spurring compatible with degenerative discs. Moderate lower lumbar facet arthropathy. Osteopenia without destructive bony lesions. Status post bilateral total hip replacements. Aortoiliac atherosclerosis. Sacroiliac joints are symmetric. IMPRESSION: Thoracic spine: Degenerative change without fracture deformity or malalignment. Lumbar spine: Old mild-to-moderate L4 compression fracture. No acute fracture deformity or malalignment. Stable degenerative change. Aortic Atherosclerosis (ICD10-I70.0). Electronically Signed   By: Elon Alas M.D.   On: 12/04/2016 16:16   Ct Head Wo Contrast  Result Date: 12/07/2016 CLINICAL DATA:  Status post fall. Hit head. Generalized weakness and confusion. Initial encounter. EXAM: CT HEAD WITHOUT CONTRAST TECHNIQUE: Contiguous axial images were obtained from the base of the skull through the vertex without intravenous contrast. COMPARISON:  CT of the head performed 12/05/2016 FINDINGS: Brain: No evidence of acute infarction, hemorrhage, hydrocephalus, extra-axial collection or mass lesion/mass effect. Prominence of the ventricles and sulci reflects mild to moderate cortical volume loss. Cerebellar atrophy is noted. Scattered periventricular and subcortical white matter change likely reflects small vessel ischemic microangiopathy. The brainstem and fourth ventricle are within normal limits. The basal ganglia are unremarkable in appearance. The cerebral hemispheres demonstrate grossly normal gray-white differentiation. No mass effect or midline shift is seen. Vascular: No hyperdense vessel or unexpected calcification.  Skull: There is no evidence of fracture; visualized osseous structures are unremarkable in appearance. Sinuses/Orbits: The orbits are within normal limits. The paranasal sinuses and mastoid air cells are well-aerated. Other: A soft tissue laceration is noted at the vertex. IMPRESSION: 1. No evidence of traumatic intracranial injury or fracture. 2. Soft tissue laceration at the vertex. 3. Mild to moderate cortical volume loss and scattered small vessel ischemic microangiopathy. Electronically Signed   By: Garald Balding M.D.   On: 12/07/2016 06:31   Ct Head Wo Contrast  Result Date: 12/05/2016 CLINICAL DATA:  83 year old male with acute head and neck injury from fall today. Initial encounter. EXAM: CT HEAD WITHOUT CONTRAST CT CERVICAL SPINE WITHOUT CONTRAST TECHNIQUE: Multidetector CT imaging of the head and cervical spine was performed following the standard protocol without intravenous contrast. Multiplanar CT image reconstructions of the cervical spine were also generated. COMPARISON:  09/12/2016 and prior CTs FINDINGS: CT HEAD FINDINGS Brain: No evidence of acute infarction, hemorrhage, hydrocephalus, extra-axial collection or mass lesion/mass effect. Atrophy and chronic small-vessel white matter ischemic changes again noted. Vascular: Atherosclerotic calcifications noted. Skull: Normal. Negative for fracture or focal lesion. Sinuses/Orbits: No acute abnormality Other: High left scalp soft tissue swelling identified. CT CERVICAL SPINE FINDINGS Alignment: Normal. Skull base and vertebrae: No acute fracture. No primary bone lesion or focal pathologic process. Soft tissues and spinal canal: No prevertebral fluid or swelling. No visible canal hematoma. Disc levels: Multilevel degenerative disc disease, spondylosis and facet arthropathy identified, greatest at C3-4. Upper chest: No acute abnormality Other: None IMPRESSION: 1. No evidence of acute intracranial abnormality 2. Left scalp soft tissue swelling without  fracture. 3. No static evidence of acute injury to the cervical spine 4. Cerebral atrophy and chronic small-vessel white matter ischemic changes 5. Moderate multilevel degenerative changes within the cervical spine. Electronically Signed   By: Margarette Canada M.D.   On: 12/05/2016 13:59   Ct Cervical Spine Wo Contrast  Result Date: 12/05/2016 CLINICAL DATA:  82 year old male with acute head and neck injury from fall today. Initial encounter. EXAM: CT HEAD WITHOUT CONTRAST CT CERVICAL SPINE WITHOUT CONTRAST TECHNIQUE: Multidetector CT imaging of the head and cervical spine was performed following the standard protocol without intravenous contrast. Multiplanar CT image  reconstructions of the cervical spine were also generated. COMPARISON:  09/12/2016 and prior CTs FINDINGS: CT HEAD FINDINGS Brain: No evidence of acute infarction, hemorrhage, hydrocephalus, extra-axial collection or mass lesion/mass effect. Atrophy and chronic small-vessel white matter ischemic changes again noted. Vascular: Atherosclerotic calcifications noted. Skull: Normal. Negative for fracture or focal lesion. Sinuses/Orbits: No acute abnormality Other: High left scalp soft tissue swelling identified. CT CERVICAL SPINE FINDINGS Alignment: Normal. Skull base and vertebrae: No acute fracture. No primary bone lesion or focal pathologic process. Soft tissues and spinal canal: No prevertebral fluid or swelling. No visible canal hematoma. Disc levels: Multilevel degenerative disc disease, spondylosis and facet arthropathy identified, greatest at C3-4. Upper chest: No acute abnormality Other: None IMPRESSION: 1. No evidence of acute intracranial abnormality 2. Left scalp soft tissue swelling without fracture. 3. No static evidence of acute injury to the cervical spine 4. Cerebral atrophy and chronic small-vessel white matter ischemic changes 5. Moderate multilevel degenerative changes within the cervical spine. Electronically Signed   By: Margarette Canada  M.D.   On: 12/05/2016 13:59     CBC  Recent Labs Lab 12/05/16 1347 12/07/16 0531  WBC 10.2 12.5*  HGB 9.9* 9.6*  HCT 29.3* 29.0*  PLT 207 198  MCV 84.0 84.1  MCH 28.4 27.7  MCHC 33.8 33.0  RDW 15.6* 16.1*    Chemistries   Recent Labs Lab 12/05/16 1347 12/07/16 0531  NA 135 136  K 4.4 4.4  CL 99* 103  CO2 26 25  GLUCOSE 98 125*  BUN 23* 24*  CREATININE 1.40* 1.65*  CALCIUM 8.9 9.2   ------------------------------------------------------------------------------------------------------------------ estimated creatinine clearance is 30.1 mL/min (A) (by C-G formula based on SCr of 1.65 mg/dL (H)). ------------------------------------------------------------------------------------------------------------------ No results for input(s): HGBA1C in the last 72 hours. ------------------------------------------------------------------------------------------------------------------ No results for input(s): CHOL, HDL, LDLCALC, TRIG, CHOLHDL, LDLDIRECT in the last 72 hours. ------------------------------------------------------------------------------------------------------------------ No results for input(s): TSH, T4TOTAL, T3FREE, THYROIDAB in the last 72 hours.  Invalid input(s): FREET3 ------------------------------------------------------------------------------------------------------------------ No results for input(s): VITAMINB12, FOLATE, FERRITIN, TIBC, IRON, RETICCTPCT in the last 72 hours.  Coagulation profile No results for input(s): INR, PROTIME in the last 168 hours.  No results for input(s): DDIMER in the last 72 hours.  Cardiac Enzymes  Recent Labs Lab 12/07/16 0531  TROPONINI <0.03   ------------------------------------------------------------------------------------------------------------------ Invalid input(s): POCBNP    Assessment & Plan   83 year old male with past medical history of multiple myeloma, hypertension, hyperlipidemia, GERD,  urinary artery disease, chronic back pain, carotid stenosis, anxiety who presents to the hospital due to altered mental status, hallucinations and frequent falls.  1. Altered mental status-related to medications prescribed for his back and polypharmacy Now his mental status is improved and back to baseline   2. Frequent falls/weakness-e secondary to deconditioning and back pain. PT eval pending  3. Essential hypertension-continue metoprolol, Imdur.  4. Anxiety-continue Klonopin as needed.  5. Hyperlipidemia-continue Pravachol.  6. Depression-continue Zoloft.  7. GERD-continue Protonix.      Code Status Orders        Start     Ordered   12/07/16 1759  Full code  Continuous     12/07/16 1758    Code Status History    Date Active Date Inactive Code Status Order ID Comments User Context   04/15/2016  7:46 PM 04/18/2016  4:15 PM Full Code [id number]  Epifanio Lesches, MD ED    Advance Directive Documentation     Most Recent Value  Type of Advance Directive  Healthcare Power of [place]  Pre-existing out of facility DNR order (yellow form or pink MOST form)  -  "MOST" Form in Place?  -           Consults  none  DVT Prophylaxis  Lovenox   Lab Results  Component Value Date   PLT 198 12/07/2016     Time Spent in minutes  50mn Greater than 50% of time spent in care coordination and counseling patient regarding the condition and plan of care.   PDustin FlockM.D on 12/08/2016 at 3:40 PM  Between 7am to 6pm - Pager - [phone]  After 6pm go to [email or web address] - password [email or web address]   Office  [phone]

## 2016-12-08 NOTE — Plan of Care (Signed)
Problem: Safety: Goal: Ability to remain free from injury will improve Outcome: Progressing Pt adhering to fall precautions.  Not attempting to get out of bed without assistance.  Remains safe from injury at this time.  Problem: Pain Managment: Goal: General experience of comfort will improve Outcome: Progressing Resting comfortably throughout the night with prescribed pain medications administered.

## 2016-12-09 DIAGNOSIS — R4182 Altered mental status, unspecified: Secondary | ICD-10-CM | POA: Diagnosis not present

## 2016-12-09 MED ORDER — LIDOCAINE 5 % EX PTCH: 1.0000 | MEDICATED_PATCH | CUTANEOUS | 1 refills | Status: DC

## 2016-12-09 MED ORDER — DEXAMETHASONE 4 MG PO TABS
4.0000 mg | ORAL_TABLET | Freq: Every day | ORAL | Status: DC
  Administered 2016-12-09: 4 mg via ORAL
  Filled 2016-12-09: qty 1

## 2016-12-09 MED ORDER — DEXAMETHASONE 4 MG PO TABS: 4.0000 mg | ORAL_TABLET | Freq: Two times a day (BID) | ORAL | 0 refills | Status: AC

## 2016-12-09 NOTE — Progress Notes (Signed)
Physical Therapy Treatment Patient Details Name: [name] MRN: [id number] DOB: 1923/06/01 Today's Date: 12/09/2016    History of Present Illness presented to ER secondary to progressive weakness, AMS/hallucinations since fall 2 days prior.  Wife unable to manage care at home. Of note, head lac sustained during fall (head CT negative for acute injury), sutured in ER during previous presentation.  Reporting consistent back pain (imaging negative for acute injury), unchanged with most recent fall.    PT Comments    Pt agreeable to PT; reports 6/10 whole back pain. Pt wishes to try and walk. Requires Mod A for STS transfer with heavy posterior lean initially; corrects with assist and verbal cues. Pt able to progress ambulation distance once balance found with Min A for straight walking increasing to Mod A for turning. Pt tends to turn walker to far throwing balance posterior again; pt also becomes mildly anxious at this point, but is able to correct with assist and walk again. Increased assist for chair approach with posterior lean and reliance on back of legs on chair to brace for sitting. Ambulated 35 ft with shoes donned for better traction. Pt lunch arrived and pt wishes to eat lunch. No further treatment at this time; pt and spouse feel pt is going to skilled nursing facility today. No current discharge at this time. Continue PT to progress strength, endurance and balance to improve all functional mobility.    Follow Up Recommendations  SNF     Equipment Recommendations       Recommendations for Other Services       Precautions / Restrictions Precautions Precautions: Fall Restrictions Weight Bearing Restrictions: No    Mobility  Bed Mobility               General bed mobility comments: Not tested; up in chair  Transfers Overall transfer level: Needs assistance Equipment used: Rolling walker (2 wheeled) Transfers: Sit to/from Stand Sit to Stand: Mod assist          General transfer comment: Posterior lean; takes time, cues and assist to right self and find center of gravity  Ambulation/Gait Ambulation/Gait assistance: Min assist;Mod assist Ambulation Distance (Feet): 35 Feet Assistive device: Rolling walker (2 wheeled) Gait Pattern/deviations: Step-through pattern;Decreased step length - right;Decreased step length - left;Wide base of support     General Gait Details: Shoes used for improved traction, as pt complains socks are too slippery. Heavy lean on rw. Min A on straight path; Mod A for turns, as pt increases posterior lean and turns walker too far.    Stairs            Wheelchair Mobility    Modified Rankin (Stroke Patients Only)       Balance Overall balance assessment: Needs assistance Sitting-balance support: Feet supported;Bilateral upper extremity supported Sitting balance-Leahy Scale: Fair     Standing balance support: Bilateral upper extremity supported Standing balance-Leahy Scale: Poor Standing balance comment: Posterior lean intially and with turning                            Cognition Arousal/Alertness: Awake/alert Behavior During Therapy: WFL for tasks assessed/performed;Anxious (becomes anxious with walk as pain and fatigue increases) Overall Cognitive Status: Within Functional Limits for tasks assessed  Exercises      General Comments        Pertinent Vitals/Pain Pain Assessment: 0-10 Pain Score: 6  Pain Location: whole back Pain Descriptors / Indicators: Constant Pain Intervention(s): Premedicated before session    Home Living                      Prior Function            PT Goals (current goals can now be found in the care plan section) Progress towards PT goals: Progressing toward goals    Frequency    Min 2X/week      PT Plan Current plan remains appropriate    Co-evaluation               AM-PAC PT "6 Clicks" Daily Activity  Outcome Measure  Difficulty turning over in bed (including adjusting bedclothes, sheets and blankets)?: Unable Difficulty moving from lying on back to sitting on the side of the bed? : Unable Difficulty sitting down on and standing up from a chair with arms (e.g., wheelchair, bedside commode, etc,.)?: Unable Help needed moving to and from a bed to chair (including a wheelchair)?: A Lot Help needed walking in hospital room?: A Lot Help needed climbing 3-5 steps with a railing? : Total 6 Click Score: 8    End of Session Equipment Utilized During Treatment: Gait belt Activity Tolerance: Patient limited by pain;Other (comment) (general weakness) Patient left: in chair;with call bell/phone within reach;with family/visitor present;Other (comment) (Pt sitting forward with feet dependent to eat meal)   PT Visit Diagnosis: Difficulty in walking, not elsewhere classified (R26.2);Muscle weakness (generalized) (M62.81);Pain;Unsteadiness on feet (R26.81)     Time: 1281-1886 PT Time Calculation (min) (ACUTE ONLY): 20 min  Charges:  $Gait Training: 8-22 mins                    G Codes:        Larae Grooms, PTA 12/09/2016, 11:59 AM

## 2016-12-09 NOTE — Discharge Summary (Signed)
[place] [facility] [place], 83 y.o., DOB 24-Feb-1923, MRN [id number]. Admission date: 12/07/2016 Discharge Date 12/09/2016 Primary MD Roselee Nova, MD Admitting Physician Henreitta Leber, MD  Admission Diagnosis  Altered mental status, unspecified altered mental status type [R41.82] Adverse effect of drug, initial encounter [T50.905A]  Discharge Diagnosis   Active Problems:   Altered mental status due to medication    Acute on chronic back pain   Frequent falls and weakness   Essential hypertension   Anxiety   Hyperlipidemia   Depression   GERD       [name]  is a 83 y.o. male with a known history of anxiety, BPH, carotid stenosis, chronic back pain, coronary artery disease, hyperlipidemia, GERD, hypertension, history of multiple myeloma, obstructive sleep apnea presents to the hospital due to hallucinations confusions and frequent falls. Patient apparently was in the ER 2 days ago after a fall and had a laceration to his scalp. He was having some back pain and was discharged on some Flexeril and was given a Norflex shot while in the ER. As per the wife patient has been more confused and weak and lethargic at home and she was not able to get him up this morning. She brought him back to the emergency room. Patient underwent a metabolic workup including a urinalysis, CT head, basic labs which are all within normal limits. Patient's muscle relaxants were held. Which she was prescribed him. His mental status improved greatly. Now he is back to baseline. He was seen by PT and recommended to go to skilled nursing facility however due to him having just Medicare in observation status he was not able to go to a skilled nursing facility. Home health has been arranged and case manager will be working with them as outpatient to assist with possible transitioning to a skilled nursing facility.         Consults  None  Significant  Tests:  See full reports for all details     Dg Thoracic Spine 2 View  Result Date: 12/04/2016 CLINICAL DATA:  [place] 2 weeks ago.  Back pain. EXAM: THORACIC SPINE 2 VIEWS; LUMBAR SPINE - COMPLETE 4+ VIEW COMPARISON:  Lumbar spine radiographs September 06, 2014 and MRI of the lumbar spine September 26, 2014 FINDINGS: Thoracic spine: Thoracic vertebral bodies intact and aligned with maintenance of thoracic kyphosis. Intervertebral disc heights preserved, multilevel ventral bridging osteophytes. No destructive bony lesions. Osteopenia. Prevertebral and paraspinal soft tissue planes are non-suspicious. Calcified aortic arch. Lumbar spine: Mild-to-moderate old, similar L4 compression fracture. Remaining lumbar vertebral bodies intact. Mild L5-S1 disc height loss with multilevel endplate spurring compatible with degenerative discs. Moderate lower lumbar facet arthropathy. Osteopenia without destructive bony lesions. Status post bilateral total hip replacements. Aortoiliac atherosclerosis. Sacroiliac joints are symmetric. IMPRESSION: Thoracic spine: Degenerative change without fracture deformity or malalignment. Lumbar spine: Old mild-to-moderate L4 compression fracture. No acute fracture deformity or malalignment. Stable degenerative change. Aortic Atherosclerosis (ICD10-I70.0). Electronically Signed   By: Elon Alas M.D.   On: 12/04/2016 16:16   Dg Lumbar Spine Complete  Result Date: 12/04/2016 CLINICAL DATA:  [place] 2 weeks ago.  Back pain. EXAM: THORACIC SPINE 2 VIEWS; LUMBAR SPINE - COMPLETE 4+ VIEW COMPARISON:  Lumbar spine radiographs September 06, 2014 and MRI of the lumbar spine September 26, 2014 FINDINGS: Thoracic spine: Thoracic vertebral bodies intact and aligned with maintenance of thoracic kyphosis. Intervertebral disc heights preserved, multilevel ventral bridging osteophytes.  No destructive bony lesions. Osteopenia. Prevertebral and paraspinal soft tissue planes are non-suspicious. Calcified aortic arch.  Lumbar spine: Mild-to-moderate old, similar L4 compression fracture. Remaining lumbar vertebral bodies intact. Mild L5-S1 disc height loss with multilevel endplate spurring compatible with degenerative discs. Moderate lower lumbar facet arthropathy. Osteopenia without destructive bony lesions. Status post bilateral total hip replacements. Aortoiliac atherosclerosis. Sacroiliac joints are symmetric. IMPRESSION: Thoracic spine: Degenerative change without fracture deformity or malalignment. Lumbar spine: Old mild-to-moderate L4 compression fracture. No acute fracture deformity or malalignment. Stable degenerative change. Aortic Atherosclerosis (ICD10-I70.0). Electronically Signed   By: Elon Alas M.D.   On: 12/04/2016 16:16   Ct Head Wo Contrast  Result Date: 12/07/2016 CLINICAL DATA:  Status post fall. Hit head. Generalized weakness and confusion. Initial encounter. EXAM: CT HEAD WITHOUT CONTRAST TECHNIQUE: Contiguous axial images were obtained from the base of the skull through the vertex without intravenous contrast. COMPARISON:  CT of the head performed 12/05/2016 FINDINGS: Brain: No evidence of acute infarction, hemorrhage, hydrocephalus, extra-axial collection or mass lesion/mass effect. Prominence of the ventricles and sulci reflects mild to moderate cortical volume loss. Cerebellar atrophy is noted. Scattered periventricular and subcortical white matter change likely reflects small vessel ischemic microangiopathy. The brainstem and fourth ventricle are within normal limits. The basal ganglia are unremarkable in appearance. The cerebral hemispheres demonstrate grossly normal gray-white differentiation. No mass effect or midline shift is seen. Vascular: No hyperdense vessel or unexpected calcification. Skull: There is no evidence of fracture; visualized osseous structures are unremarkable in appearance. Sinuses/Orbits: The orbits are within normal limits. The paranasal sinuses and mastoid air cells  are well-aerated. Other: A soft tissue laceration is noted at the vertex. IMPRESSION: 1. No evidence of traumatic intracranial injury or fracture. 2. Soft tissue laceration at the vertex. 3. Mild to moderate cortical volume loss and scattered small vessel ischemic microangiopathy. Electronically Signed   By: Garald Balding M.D.   On: 12/07/2016 06:31   Ct Head Wo Contrast  Result Date: 12/05/2016 CLINICAL DATA:  83 year old male with acute head and neck injury from fall today. Initial encounter. EXAM: CT HEAD WITHOUT CONTRAST CT CERVICAL SPINE WITHOUT CONTRAST TECHNIQUE: Multidetector CT imaging of the head and cervical spine was performed following the standard protocol without intravenous contrast. Multiplanar CT image reconstructions of the cervical spine were also generated. COMPARISON:  09/12/2016 and prior CTs FINDINGS: CT HEAD FINDINGS Brain: No evidence of acute infarction, hemorrhage, hydrocephalus, extra-axial collection or mass lesion/mass effect. Atrophy and chronic small-vessel white matter ischemic changes again noted. Vascular: Atherosclerotic calcifications noted. Skull: Normal. Negative for fracture or focal lesion. Sinuses/Orbits: No acute abnormality Other: High left scalp soft tissue swelling identified. CT CERVICAL SPINE FINDINGS Alignment: Normal. Skull base and vertebrae: No acute fracture. No primary bone lesion or focal pathologic process. Soft tissues and spinal canal: No prevertebral fluid or swelling. No visible canal hematoma. Disc levels: Multilevel degenerative disc disease, spondylosis and facet arthropathy identified, greatest at C3-4. Upper chest: No acute abnormality Other: None IMPRESSION: 1. No evidence of acute intracranial abnormality 2. Left scalp soft tissue swelling without fracture. 3. No static evidence of acute injury to the cervical spine 4. Cerebral atrophy and chronic small-vessel white matter ischemic changes 5. Moderate multilevel degenerative changes within the  cervical spine. Electronically Signed   By: Margarette Canada M.D.   On: 12/05/2016 13:59   Ct Cervical Spine Wo Contrast  Result Date: 12/05/2016 CLINICAL DATA:  83 year old male with acute head and neck injury from fall today. Initial encounter.  EXAM: CT HEAD WITHOUT CONTRAST CT CERVICAL SPINE WITHOUT CONTRAST TECHNIQUE: Multidetector CT imaging of the head and cervical spine was performed following the standard protocol without intravenous contrast. Multiplanar CT image reconstructions of the cervical spine were also generated. COMPARISON:  09/12/2016 and prior CTs FINDINGS: CT HEAD FINDINGS Brain: No evidence of acute infarction, hemorrhage, hydrocephalus, extra-axial collection or mass lesion/mass effect. Atrophy and chronic small-vessel white matter ischemic changes again noted. Vascular: Atherosclerotic calcifications noted. Skull: Normal. Negative for fracture or focal lesion. Sinuses/Orbits: No acute abnormality Other: High left scalp soft tissue swelling identified. CT CERVICAL SPINE FINDINGS Alignment: Normal. Skull base and vertebrae: No acute fracture. No primary bone lesion or focal pathologic process. Soft tissues and spinal canal: No prevertebral fluid or swelling. No visible canal hematoma. Disc levels: Multilevel degenerative disc disease, spondylosis and facet arthropathy identified, greatest at C3-4. Upper chest: No acute abnormality Other: None IMPRESSION: 1. No evidence of acute intracranial abnormality 2. Left scalp soft tissue swelling without fracture. 3. No static evidence of acute injury to the cervical spine 4. Cerebral atrophy and chronic small-vessel white matter ischemic changes 5. Moderate multilevel degenerative changes within the cervical spine. Electronically Signed   By: Margarette Canada M.D.   On: 12/05/2016 13:59       Today   Subjective:   [name] complains of pain in his back  Objective:   Blood pressure (!) 99/53, pulse 81, temperature 98.1 F (36.7 C), temperature  source Oral, resp. rate 20, height '5\' 10"'$  (1.778 m), weight 181 lb 1.6 oz (82.1 kg), SpO2 97 %.  .  Intake/Output Summary (Last 24 hours) at 12/09/16 1455 Last data filed at 12/09/16 1354  Gross per 24 hour  Intake              954 ml  Output              500 ml  Net              454 ml    Exam VITAL SIGNS: Blood pressure (!) 99/53, pulse 81, temperature 98.1 F (36.7 C), temperature source Oral, resp. rate 20, height '5\' 10"'$  (1.778 m), weight 181 lb 1.6 oz (82.1 kg), SpO2 97 %.  GENERAL:  83 y.o.-year-old patient lying in the bed with no acute distress.  EYES: Pupils equal, round, reactive to light and accommodation. No scleral icterus. Extraocular muscles intact.  HEENT: Head atraumatic, normocephalic. Oropharynx and nasopharynx clear.  NECK:  Supple, no jugular venous distention. No thyroid enlargement, no tenderness.  LUNGS: Normal breath sounds bilaterally, no wheezing, rales,rhonchi or crepitation. No use of accessory muscles of respiration.  CARDIOVASCULAR: S1, S2 normal. No murmurs, rubs, or gallops.  ABDOMEN: Soft, nontender, nondistended. Bowel sounds present. No organomegaly or mass.  EXTREMITIES: No pedal edema, cyanosis, or clubbing.  NEUROLOGIC: Cranial nerves II through XII are intact. Muscle strength 5/5 in all extremities. Sensation intact. Gait not checked.  PSYCHIATRIC: The patient is alert and oriented x 3.  SKIN: No obvious rash, lesion, or ulcer.   Data Review     CBC w Diff: Lab Results  Component Value Date   WBC 12.5 (H) 12/07/2016   HGB 9.6 (L) 12/07/2016   HGB 8.7 (L) 12/18/2013   HCT 29.0 (L) 12/07/2016   HCT 25.6 (L) 12/17/2013   PLT 198 12/07/2016   PLT 156 12/17/2013   LYMPHOPCT 18 09/14/2016   LYMPHOPCT 4.0 12/17/2013   MONOPCT 10 09/14/2016   MONOPCT 8.6 12/17/2013   EOSPCT  1 09/14/2016   EOSPCT 0.7 12/17/2013   BASOPCT 0 09/14/2016   BASOPCT 0.1 12/17/2013   CMP: Lab Results  Component Value Date   NA 136 12/07/2016   NA 139  09/06/2014   NA 136 12/18/2013   K 4.4 12/07/2016   K 4.5 12/18/2013   CL 103 12/07/2016   CL 103 12/18/2013   CO2 25 12/07/2016   CO2 26 12/18/2013   BUN 24 (H) 12/07/2016   BUN 27 09/06/2014   BUN 14 12/18/2013   CREATININE 1.65 (H) 12/07/2016   CREATININE 1.38 (H) 09/14/2016   PROT 8.0 05/12/2016   PROT 7.9 09/06/2014   PROT 9.3 (H) 06/03/2013   ALBUMIN 3.5 05/12/2016   ALBUMIN 4.0 09/06/2014   ALBUMIN 3.6 06/03/2013   BILITOT 0.6 05/12/2016   BILITOT 0.4 09/06/2014   BILITOT 0.5 06/03/2013   ALKPHOS 115 05/12/2016   ALKPHOS 94 06/03/2013   AST 22 05/12/2016   AST 28 06/03/2013   ALT 10 (L) 05/12/2016   ALT 21 06/03/2013  .  Micro Results No results found for this or any previous visit (from the past 240 hour(s)).      Code Status Orders        Start     Ordered   12/07/16 1759  Full code  Continuous     12/07/16 1758    Code Status History    Date Active Date Inactive Code Status Order ID Comments User Context   04/15/2016  7:46 PM 04/18/2016  4:15 PM Full Code [id number]  Katha Hamming, MD ED    Advance Directive Documentation     Most Recent Value  Type of Advance Directive  Healthcare Power of Attorney  Pre-existing out of facility DNR order (yellow form or pink MOST form)  -  "MOST" Form in Place?  -          Follow-up Information    Ellyn Hack, MD. Go on 12/15/2016.   Specialty:  Family Medicine Why:  at 4:00 p.m. for hospital f/u Contact information: [address] [place] [address] [place] [address] [phone]           Discharge Medications   Allergies as of 12/09/2016      Reactions   Sulfamethoxazole-trimethoprim Nausea Only, Other (See Comments)   Baclofen Other (See Comments)   Confusion and weakness   Nsaids    Avoids due to liver.   Tolmetin Other (See Comments)   Avoids due to liver.      Medication List    STOP taking these medications   cyclobenzaprine 10 MG tablet Commonly known as:   FLEXERIL   orphenadrine 100 MG tablet Commonly known as:  NORFLEX   tiZANidine 2 MG tablet Commonly known as:  ZANAFLEX     TAKE these medications   acetaminophen 500 MG tablet Commonly known as:  TYLENOL Take 2 tablets (1,000 mg total) by mouth every 6 (six) hours.   bacitracin 500 UNIT/GM ointment Apply 1 application topically 2 (two) times daily.   cephALEXin 500 MG capsule Commonly known as:  KEFLEX Take 1 capsule (500 mg total) by mouth 4 (four) times daily.   clonazePAM 0.5 MG tablet Commonly known as:  KLONOPIN Take 1 tablet (0.5 mg total) by mouth 3 (three) times daily as needed for anxiety.   dexamethasone 4 MG tablet Commonly known as:  DECADRON Take 1 tablet (4 mg total) by mouth 2 (two) times daily.   doxazosin 1 MG tablet Commonly known  as:  CARDURA Take 1 tablet (1 mg total) by mouth daily.   isosorbide mononitrate 30 MG 24 hr tablet Commonly known as:  IMDUR Take 1 tablet (30 mg total) by mouth daily.   lidocaine 5 % Commonly known as:  LIDODERM Place 1 patch onto the skin daily. Remove & Discard patch within 12 hours or as directed by MD   metoprolol tartrate 25 MG tablet Commonly known as:  LOPRESSOR 50 mg po qAM, 25 mg po qHS What changed:  how much to take  how to take this  when to take this  additional instructions   nitroGLYCERIN 0.4 MG SL tablet Commonly known as:  NITROSTAT Place 0.4 mg under the tongue every 5 (five) minutes as needed for chest pain.   pantoprazole 40 MG tablet Commonly known as:  PROTONIX Take 1 tablet (40 mg total) by mouth daily.   polyethylene glycol packet Commonly known as:  MIRALAX / GLYCOLAX Take 17 g by mouth daily.   pravastatin 40 MG tablet Commonly known as:  PRAVACHOL Take 1 tablet (40 mg total) by mouth daily.   PRESERVISION/LUTEIN PO Take by mouth 2 (two) times daily.   sertraline 100 MG tablet Commonly known as:  ZOLOFT Take 1.5 tablets (150 mg total) by mouth at bedtime.   traMADol  50 MG tablet Commonly known as:  ULTRAM Take 50 mg by mouth every 6 (six) hours as needed for moderate pain or severe pain.          Total Time in preparing paper work, data evaluation and todays exam - 35 minutes  Dustin Flock M.D on 12/09/2016 at 2:55 PM  [facility]   Office  [phone]

## 2016-12-09 NOTE — Discharge Instructions (Signed)
[facility] [place] [facility] ° °DIET:  °Cardiac diet ° °DISCHARGE CONDITION:  °Stable ° °ACTIVITY:  °Activity as tolerated ° °OXYGEN:  °Home Oxygen: No. °  °Oxygen Delivery: room air ° °DISCHARGE LOCATION:  °home  ° ° °ADDITIONAL DISCHARGE INSTRUCTION: ° ° °If you experience worsening of your admission symptoms, develop shortness of breath, life threatening emergency, suicidal or homicidal thoughts you must seek medical attention immediately by calling 911 or calling your MD immediately  if symptoms less severe. ° °You Must read complete instructions/literature along with all the possible adverse reactions/side effects for all the Medicines you take and that have been prescribed to you. Take any new Medicines after you have completely understood and accpet all the possible adverse reactions/side effects.  ° °Please note ° °You were cared for by a hospitalist during your hospital stay. If you have any questions about your discharge medications or the care you received while you were in the hospital after you are discharged, you can call the unit and asked to speak with the hospitalist on call if the hospitalist that took care of you is not available. Once you are discharged, your primary care physician will handle any further medical issues. Please note that NO REFILLS for any discharge medications will be authorized once you are discharged, as it is imperative that you return to your primary care physician (or establish a relationship with a primary care physician if you do not have one) for your aftercare needs so that they can reassess your need for medications and monitor your lab values. ° ° °

## 2016-12-09 NOTE — Care Management Note (Signed)
Case Management Note  Patient Details  Name: [name] MRN: [id number] Date of Birth: Oct 31, 1923  Subjective/Objective:  Patient from [facility]. Wife is caregiver. Met with liaison from [facility] and she states that wife called her regarding a referral. She has seen patient and wife at bedside to discuss home health. Referral for SN, PT and HHA. Met with wife and she states she did call referral and would like to have [facility] at DC. Patient uses a walker and rolator at home.PCP id Dr. Manuella Ghazi. Wife denies issues paying for medication.  No further needs identified.                   Action/Plan: [facility] for SN,  PT and HHA  Expected Discharge Date:  12/10/16               Expected Discharge Plan:  [place]  In-House Referral:     Discharge planning Services  CM Consult  Post Acute Care Choice:  Home Health Choice offered to:  Spouse  DME Arranged:    DME Agency:     HH Arranged:  PT, RN, Nurse's Aide HH Agency:  [facility]  Status of Service:  In process, will continue to follow  If discussed at Long Length of Stay Meetings, dates discussed:    Additional Comments:  Jolly Mango, RN 12/09/2016, 11:36 AM

## 2016-12-09 NOTE — Progress Notes (Signed)
Pt is being discharged today. Discharge instructions reviewed with the patient and his husband, they both verified understanding. No paper prescriptions given to them. All belongings packed and returned to the patient. He was rolled out in a wheelchair by staff.

## 2016-12-14 ENCOUNTER — Inpatient Hospital Stay: Payer: Medicare Other | Admitting: Family Medicine

## 2016-12-15 ENCOUNTER — Inpatient Hospital Stay: Payer: Medicare Other | Admitting: Family Medicine

## 2016-12-16 ENCOUNTER — Other Ambulatory Visit: Payer: Self-pay | Admitting: Physical Medicine and Rehabilitation

## 2016-12-16 DIAGNOSIS — M546 Pain in thoracic spine: Secondary | ICD-10-CM

## 2016-12-18 ENCOUNTER — Encounter: Payer: Self-pay | Admitting: Emergency Medicine

## 2016-12-18 ENCOUNTER — Emergency Department
Admission: EM | Admit: 2016-12-18 | Discharge: 2016-12-18 | Disposition: A | Discharge status: Home / Self Care | Payer: Medicare Other | Attending: Emergency Medicine | Admitting: Emergency Medicine

## 2016-12-18 DIAGNOSIS — Z4801 Encounter for change or removal of surgical wound dressing: Secondary | ICD-10-CM | POA: Diagnosis not present

## 2016-12-18 DIAGNOSIS — I2510 Atherosclerotic heart disease of native coronary artery without angina pectoris: Secondary | ICD-10-CM | POA: Insufficient documentation

## 2016-12-18 DIAGNOSIS — Z5189 Encounter for other specified aftercare: Secondary | ICD-10-CM

## 2016-12-18 DIAGNOSIS — I10 Essential (primary) hypertension: Secondary | ICD-10-CM | POA: Diagnosis not present

## 2016-12-18 DIAGNOSIS — Z87891 Personal history of nicotine dependence: Secondary | ICD-10-CM | POA: Diagnosis not present

## 2016-12-18 DIAGNOSIS — Z79899 Other long term (current) drug therapy: Secondary | ICD-10-CM | POA: Diagnosis not present

## 2016-12-18 NOTE — ED Notes (Signed)
Pt verbalized understanding of discharge instructions. NAD at this time. 

## 2016-12-18 NOTE — ED Provider Notes (Signed)
[facility] Emergency Department Provider Note  ____________________________________________  Time seen: Approximately 12:32 PM  I have reviewed the triage vital signs and the nursing notes.   HISTORY  Chief Complaint Laceration   HPI [name] is a 83 y.o. male who presents for evaluation of a wound check. Patient sustained a fall on 12/05/16 with a large scalp laceration which was repaired with several staples and stitches. Today home health nurse was checking in the wound and noted a small little area that was oozing blood. After the nurse left that area started bleeding more which prompted visit to the emergency room. There is no current bleeding or open areas noted.  Past Medical History:  Diagnosis Date  . Anxiety   . BPH (benign prostatic hypertrophy)   . Carotid stenosis    a. 05/2015 Carotid U/S: mild nonobs atherosclerosis. No need for f/u.  Marland Kitchen Chronic back pain   . Coronary artery disease    a. 06/2013 Cath/PCI: LAD 50-70p (FFR 0.82-->PCI w/ 3.5x24 Promus DES), RCA subtotally occluded w/ L->R collats, LCX 30ost.  . Dyslipidemia   . GERD (gastroesophageal reflux disease)   . Hx of [name]   . Hyperlipidemia   . Hypertension   . Inguinal hernia   . Lightheadedness    a. chronic, somewhat positional.  . 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 Major depression   . Multiple myeloma ([place])   . Pleural effusion    left  . Sleep apnea     Patient Active Problem List   Diagnosis Date Noted  . Altered mental status 12/07/2016  . Acute kidney injury ([place]) 09/14/2016  . Head injury, intracranial, without loss of consciousness or fracture ([place]) 09/07/2016  . Groin pain, left 06/15/2016  . Closed right hip fracture, with routine healing, subsequent encounter 04/15/2016  . Productive cough 03/23/2016  . Medicare annual wellness visit, subsequent 02/19/2016  . Major depression 10/14/2015  . Behavior disturbance 09/12/2015  . Mobility impaired  08/27/2015  . Skin ulcer of back ([place]) 07/01/2015  . Laceration of right hand 07/01/2015  . Traumatic hematoma of lower back 07/01/2015  . Encounters for administrative purpose 04/01/2015  . Skin lesion of face 12/24/2014  . Contusion of right hand 11/28/2014  . Herpes zoster 10/30/2014  . Need for immunization against influenza 10/24/2014  . Compression fracture of L4 lumbar vertebra (HCC) 09/28/2014  . Rectal or anal pain 08/13/2014  . Anxiety disorder 07/23/2014  . BPH without obstruction/lower urinary tract symptoms 07/23/2014  . Atherosclerosis of coronary artery 07/23/2014  . Chronic LBP 07/23/2014  . Hypercholesteremia 07/23/2014  . Benign hypertension 07/23/2014  . Acid reflux 07/23/2014  . Chronic recurrent major depressive disorder ([place]) 07/23/2014  . GERD (gastroesophageal reflux disease) 07/23/2014  . Coronary artery disease involving native coronary artery without angina pectoris 07/06/2014  . Essential hypertension 07/06/2014  . Carotid stenosis 07/06/2014  . Hyperlipidemia   . H/O [name] 12/20/2011    Past Surgical History:  Procedure Laterality Date  . ANTERIOR APPROACH HEMI HIP ARTHROPLASTY Right 04/16/2016   Procedure: ANTERIOR APPROACH HEMI HIP ARTHROPLASTY;  Surgeon: Hessie Knows, MD;  Location: [facility] ORS;  Service: Orthopedics;  Laterality: Right;  . APPENDECTOMY    . CARDIAC CATHETERIZATION  2002  . CARDIAC CATHETERIZATION  06/2013  . COLONOSCOPY    . FEMUR FRACTURE SURGERY Left    with rod  . HIP FRACTURE SURGERY    . JOINT REPLACEMENT     right partial knee replacement  Prior to Admission medications   Medication Sig Start Date End Date Taking? Authorizing Provider  acetaminophen (TYLENOL) 500 MG tablet Take 2 tablets (1,000 mg total) by mouth every 6 (six) hours. 04/17/16   Dustin Flock, MD  bacitracin 500 UNIT/GM ointment Apply 1 application topically 2 (two) times daily. 11/24/16   Roselee Nova, MD  clonazePAM (KLONOPIN) 0.5 MG tablet  Take 1 tablet (0.5 mg total) by mouth 3 (three) times daily as needed for anxiety. 11/06/16   Roselee Nova, MD  doxazosin (CARDURA) 1 MG tablet Take 1 tablet (1 mg total) by mouth daily. 11/24/16   Roselee Nova, MD  isosorbide mononitrate (IMDUR) 30 MG 24 hr tablet Take 1 tablet (30 mg total) by mouth daily. 09/07/16   Roselee Nova, MD  lidocaine (LIDODERM) 5 % Place 1 patch onto the skin daily. Remove & Discard patch within 12 hours or as directed by MD 12/09/16 12/09/17  Dustin Flock, MD  metoprolol tartrate (LOPRESSOR) 25 MG tablet 50 mg po qAM, 25 mg po qHS Patient taking differently: Take 25-50 mg by mouth 2 (two) times daily. 50 mg po qAM, 25 mg po qHS 08/31/16   Roselee Nova, MD  Multiple Vitamins-Minerals (PRESERVISION/LUTEIN PO) Take by mouth 2 (two) times daily.    [provider]  nitroGLYCERIN (NITROSTAT) 0.4 MG SL tablet Place 0.4 mg under the tongue every 5 (five) minutes as needed for chest pain.    [provider]  pantoprazole (PROTONIX) 40 MG tablet Take 1 tablet (40 mg total) by mouth daily. 08/31/16   Roselee Nova, MD  polyethylene glycol [facility] / Floria Raveling) packet Take 17 g by mouth daily.    [provider]  pravastatin (PRAVACHOL) 40 MG tablet Take 1 tablet (40 mg total) by mouth daily. 11/06/16   Roselee Nova, MD  sertraline (ZOLOFT) 100 MG tablet Take 1.5 tablets (150 mg total) by mouth at bedtime. 10/12/16   Roselee Nova, MD  traMADol (ULTRAM) 50 MG tablet Take 50 mg by mouth every 6 (six) hours as needed for moderate pain or severe pain.  07/10/16   [provider]    Allergies Sulfamethoxazole-trimethoprim; Baclofen; Nsaids; and Tolmetin  Family History  Problem Relation Age of Onset  . Heart disease Mother   . Stroke Mother   . Diabetes Father   . Dementia Father   . Stroke Sister   . Stroke Brother   . Stroke Sister   . Stroke Sister   . Stroke Brother   . Stroke Brother     Social  History Social History  Substance Use Topics  . Smoking status: Former Smoker    Packs/day: 2.00    Years: 7.00    Types: Cigarettes    Quit date: 09/17/1953  . Smokeless tobacco: Never Used  . Alcohol use No    Review of Systems  Constitutional: Negative for fever. Cardiovascular: Negative for chest pain. Respiratory: Negative for shortness of breath. Gastrointestinal: Negative for abdominal pain, vomiting or diarrhea. Skin: + wound bleeding Neurological: Negative for headaches, weakness or numbness. Psych: No SI or HI  ____________________________________________   PHYSICAL EXAM:  VITAL SIGNS: ED Triage Vitals  Enc Vitals Group     BP 12/18/16 1109 (!) 117/54     Pulse Rate 12/18/16 1109 68     Resp 12/18/16 1109 16     Temp 12/18/16 1109 98.1 F (36.7 C)  Temp Source 12/18/16 1109 Oral     SpO2 12/18/16 1109 98 %     Weight 12/18/16 1109 196 lb (88.9 kg)     Height 12/18/16 1109 _0  (1.778 m)     Head Circumference --      Peak Flow --      Pain Score 12/18/16 1202 0     Pain Loc --      Pain Edu? --      Excl. in [place]? --     Constitutional: Alert and oriented. Well appearing and in no apparent distress. HEENT:      Head: Normocephalic and atraumatic. There is a large healing laceration area of the scalp with several stitches in place, no active bleeding. No erythema, warmth, purulent discharge, or ttp        Eyes: Conjunctivae are normal. Sclera is non-icteric.       Mouth/Throat: Mucous membranes are moist.       Neck: Supple with no signs of meningismus. Cardiovascular: Regular rate and rhythm. No murmurs, gallops, or rubs. 2+ symmetrical distal pulses are present in all extremities. No JVD. Respiratory: Normal respiratory effort. Lungs are clear to auscultation bilaterally. No wheezes, crackles, or rhonchi.  Neurologic: Normal speech and language. Face is symmetric. Moving all extremities. No gross focal neurologic deficits are appreciated. Skin: Skin  is warm, dry and intact. No rash noted. Psychiatric: Mood and affect are normal. Speech and behavior are normal.  ____________________________________________   LABS (all labs ordered are listed, but only abnormal results are displayed)  Labs Reviewed - No data to display ____________________________________________  EKG  none  ____________________________________________  RADIOLOGY  none  ____________________________________________   PROCEDURES  Procedure(s) performed: None Procedures Critical Care performed:  None ____________________________________________   INITIAL IMPRESSION / ASSESSMENT AND PLAN / ED COURSE   83 y.o. male who presents for evaluation of a wound check. Patient noted some oozing/bleeding from scalp laceration sustained 2 weeks ago. No evidence of active bleeding or in my evaluation, no evidence of infection with no warmth, no erythema, no fever, no tenderness. Discussed with patient that if the bleeding recurs that they should apply pressure for 20 minutes. Discussed close follow-up with primary care doctor in 2 days for stitch removal.      As part of my medical decision making, I reviewed the following data within the [place] History obtained from family, Old chart reviewed, Notes from prior ED visits and [place] Controlled Substance Database    Pertinent labs & imaging results that were available during my care of the patient were reviewed by me and considered in my medical decision making (see chart for details).    ____________________________________________   FINAL CLINICAL IMPRESSION(S) / ED DIAGNOSES  Final diagnoses:  Visit for wound check      NEW MEDICATIONS STARTED DURING THIS VISIT:  New Prescriptions   No medications on file     Note:  This document was prepared using Dragon voice recognition software and may include unintentional dictation errors.    Rudene Re, MD 12/18/16 1236

## 2016-12-18 NOTE — ED Triage Notes (Signed)
Pt fell and hit his head on October 19 th and had staples put in, 2 days later pt fell again and the staples were taken out and stitches put in. Pt has been having bleeding on and off since Monday. Home health nurse advised pt to come back to ED to have laceration re-evaluated.

## 2016-12-20 ENCOUNTER — Emergency Department
Admission: EM | Admit: 2016-12-20 | Discharge: 2016-12-20 | Disposition: A | Discharge status: Home / Self Care | Payer: Medicare Other | Attending: Student in an Organized Health Care Education/Training Program | Admitting: Student in an Organized Health Care Education/Training Program

## 2016-12-20 ENCOUNTER — Encounter: Payer: Self-pay | Admitting: Emergency Medicine

## 2016-12-20 ENCOUNTER — Other Ambulatory Visit: Payer: Self-pay

## 2016-12-20 DIAGNOSIS — I2510 Atherosclerotic heart disease of native coronary artery without angina pectoris: Secondary | ICD-10-CM | POA: Insufficient documentation

## 2016-12-20 DIAGNOSIS — I10 Essential (primary) hypertension: Secondary | ICD-10-CM | POA: Diagnosis not present

## 2016-12-20 DIAGNOSIS — S0102XD Laceration with foreign body of scalp, subsequent encounter: Secondary | ICD-10-CM | POA: Diagnosis not present

## 2016-12-20 DIAGNOSIS — Z79899 Other long term (current) drug therapy: Secondary | ICD-10-CM | POA: Diagnosis not present

## 2016-12-20 DIAGNOSIS — Z87891 Personal history of nicotine dependence: Secondary | ICD-10-CM | POA: Diagnosis not present

## 2016-12-20 DIAGNOSIS — X58XXXD Exposure to other specified factors, subsequent encounter: Secondary | ICD-10-CM | POA: Insufficient documentation

## 2016-12-20 DIAGNOSIS — Z96651 Presence of right artificial knee joint: Secondary | ICD-10-CM | POA: Insufficient documentation

## 2016-12-20 DIAGNOSIS — Z4802 Encounter for removal of sutures: Secondary | ICD-10-CM

## 2016-12-20 NOTE — ED Notes (Signed)
Pt's head wrapped with Curity wrap and non stick dressing

## 2016-12-20 NOTE — ED Provider Notes (Signed)
[facility] Emergency Department Provider Note  ____________________________________________  Time seen: Approximately 11:14 AM  I have reviewed the triage vital signs and the nursing notes.   HISTORY  Chief Complaint Suture / Staple Removal    HPI [name] is a 83 y.o. male that presents to emergency department for suture removal. Patient had multiple sutures placed in his scalp 2 weeks ago. It has been oozing blood off and on for the last couple of days. No concerns for infection. He does not take any blood thinners.   Past Medical History:  Diagnosis Date  . Anxiety   . BPH (benign prostatic hypertrophy)   . Carotid stenosis    a. 05/2015 Carotid U/S: mild nonobs atherosclerosis. No need for f/u.  Marland Kitchen Chronic back pain   . Coronary artery disease    a. 06/2013 Cath/PCI: LAD 50-70p (FFR 0.82-->PCI w/ 3.5x24 Promus DES), RCA subtotally occluded w/ L->R collats, LCX 30ost.  . Dyslipidemia   . GERD (gastroesophageal reflux disease)   . Hx of [name]   . Hyperlipidemia   . Hypertension   . Inguinal hernia   . Lightheadedness    a. chronic, somewhat positional.  . 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 Major depression   . Multiple myeloma ([place])   . Pleural effusion    left  . Sleep apnea     Patient Active Problem List   Diagnosis Date Noted  . Altered mental status 12/07/2016  . Acute kidney injury ([place]) 09/14/2016  . Head injury, intracranial, without loss of consciousness or fracture ([place]) 09/07/2016  . Groin pain, left 06/15/2016  . Closed right hip fracture, with routine healing, subsequent encounter 04/15/2016  . Productive cough 03/23/2016  . Medicare annual wellness visit, subsequent 02/19/2016  . Major depression 10/14/2015  . Behavior disturbance 09/12/2015  . Mobility impaired 08/27/2015  . Skin ulcer of back ([place]) 07/01/2015  . Laceration of right hand 07/01/2015  . Traumatic hematoma of lower back 07/01/2015  . Encounters for  administrative purpose 04/01/2015  . Skin lesion of face 12/24/2014  . Contusion of right hand 11/28/2014  . Herpes zoster 10/30/2014  . Need for immunization against influenza 10/24/2014  . Compression fracture of L4 lumbar vertebra (HCC) 09/28/2014  . Rectal or anal pain 08/13/2014  . Anxiety disorder 07/23/2014  . BPH without obstruction/lower urinary tract symptoms 07/23/2014  . Atherosclerosis of coronary artery 07/23/2014  . Chronic LBP 07/23/2014  . Hypercholesteremia 07/23/2014  . Benign hypertension 07/23/2014  . Acid reflux 07/23/2014  . Chronic recurrent major depressive disorder ([place]) 07/23/2014  . GERD (gastroesophageal reflux disease) 07/23/2014  . Coronary artery disease involving native coronary artery without angina pectoris 07/06/2014  . Essential hypertension 07/06/2014  . Carotid stenosis 07/06/2014  . Hyperlipidemia   . H/O [name] 12/20/2011    Past Surgical History:  Procedure Laterality Date  . APPENDECTOMY    . CARDIAC CATHETERIZATION  2002  . CARDIAC CATHETERIZATION  06/2013  . COLONOSCOPY    . FEMUR FRACTURE SURGERY Left    with rod  . HIP FRACTURE SURGERY    . JOINT REPLACEMENT     right partial knee replacement    Prior to Admission medications   Medication Sig Start Date End Date Taking? Authorizing Provider  acetaminophen (TYLENOL) 500 MG tablet Take 2 tablets (1,000 mg total) by mouth every 6 (six) hours. 04/17/16   Dustin Flock, MD  bacitracin 500 UNIT/GM ointment Apply 1 application topically 2 (two) times daily.  11/24/16   Roselee Nova, MD  clonazePAM (KLONOPIN) 0.5 MG tablet Take 1 tablet (0.5 mg total) by mouth 3 (three) times daily as needed for anxiety. 11/06/16   Roselee Nova, MD  doxazosin (CARDURA) 1 MG tablet Take 1 tablet (1 mg total) by mouth daily. 11/24/16   Roselee Nova, MD  isosorbide mononitrate (IMDUR) 30 MG 24 hr tablet Take 1 tablet (30 mg total) by mouth daily. 09/07/16   Roselee Nova, MD  lidocaine  (LIDODERM) 5 % Place 1 patch onto the skin daily. Remove & Discard patch within 12 hours or as directed by MD 12/09/16 12/09/17  Dustin Flock, MD  metoprolol tartrate (LOPRESSOR) 25 MG tablet 50 mg po qAM, 25 mg po qHS Patient taking differently: Take 25-50 mg by mouth 2 (two) times daily. 50 mg po qAM, 25 mg po qHS 08/31/16   Roselee Nova, MD  Multiple Vitamins-Minerals (PRESERVISION/LUTEIN PO) Take by mouth 2 (two) times daily.    [provider]  nitroGLYCERIN (NITROSTAT) 0.4 MG SL tablet Place 0.4 mg under the tongue every 5 (five) minutes as needed for chest pain.    [provider]  pantoprazole (PROTONIX) 40 MG tablet Take 1 tablet (40 mg total) by mouth daily. 08/31/16   Roselee Nova, MD  polyethylene glycol [facility] / Floria Raveling) packet Take 17 g by mouth daily.    [provider]  pravastatin (PRAVACHOL) 40 MG tablet Take 1 tablet (40 mg total) by mouth daily. 11/06/16   Roselee Nova, MD  sertraline (ZOLOFT) 100 MG tablet Take 1.5 tablets (150 mg total) by mouth at bedtime. 10/12/16   Roselee Nova, MD  traMADol (ULTRAM) 50 MG tablet Take 50 mg by mouth every 6 (six) hours as needed for moderate pain or severe pain.  07/10/16   [provider]    Allergies Sulfamethoxazole-trimethoprim; Baclofen; Nsaids; and Tolmetin  Family History  Problem Relation Age of Onset  . Heart disease Mother   . Stroke Mother   . Diabetes Father   . Dementia Father   . Stroke Sister   . Stroke Brother   . Stroke Sister   . Stroke Sister   . Stroke Brother   . Stroke Brother     Social History Social History   Tobacco Use  . Smoking status: Former Smoker    Packs/day: 2.00    Years: 7.00    Pack years: 14.00    Types: Cigarettes    Last attempt to quit: 09/17/1953    Years since quitting: 63.3  . Smokeless tobacco: Never Used  Substance Use Topics  . Alcohol use: No    Alcohol/week: 0.0 oz  . Drug use: No     Review of Systems   Constitutional: No fever/chills Cardiovascular: No chest pain. Respiratory: No SOB. Gastrointestinal: No abdominal pain.  No nausea, no vomiting.  Musculoskeletal: Negative for musculoskeletal pain.   ____________________________________________   PHYSICAL EXAM:  VITAL SIGNS: ED Triage Vitals  Enc Vitals Group     BP 12/20/16 0950 (!) 111/49     Pulse Rate 12/20/16 0950 72     Resp 12/20/16 0950 18     Temp 12/20/16 0950 98.5 F (36.9 C)     Temp Source 12/20/16 0950 Oral     SpO2 12/20/16 0950 97 %     Weight 12/20/16 0951 195 lb (88.5 kg)     Height 12/20/16 0951 _0  (  1.778 m)     Head Circumference --      Peak Flow --      Pain Score --      Pain Loc --      Pain Edu? --      Excl. in [place]? --      Constitutional: Alert and oriented. Well appearing and in no acute distress. Eyes: Conjunctivae are normal. PERRL. EOMI. Head: Large healing laceration area of the scalp with several visible stitches. No active bleeding. No erythema, warmth, and purulent discharge, tenderness to palpation. [name]:      Ears:      Nose: No congestion/rhinnorhea.      Mouth/Throat: Mucous membranes are moist.  Neck: No stridor.  Cardiovascular: Normal rate, regular rhythm.  Good peripheral circulation. Respiratory: Normal respiratory effort without tachypnea or retractions. Lungs CTAB. Good air entry to the bases with no decreased or absent breath sounds. Musculoskeletal: Full range of motion to all extremities. No gross deformities appreciated. Neurologic:  Normal speech and language. No gross focal neurologic deficits are appreciated.  Skin:  Skin is warm, dry.   ____________________________________________   LABS (all labs ordered are listed, but only abnormal results are displayed)  Labs Reviewed - No data to display ____________________________________________  EKG   ____________________________________________  RADIOLOGY  No results  found.  ____________________________________________    PROCEDURES  Procedure(s) performed:    Procedures  17 sutures removed with scissors and [place]. Absorbable hemostat was placed over using blood.  Medications - No data to display   ____________________________________________   INITIAL IMPRESSION / ASSESSMENT AND PLAN / ED COURSE  Pertinent labs & imaging results that were available during my care of the patient were reviewed by me and considered in my medical decision making (see chart for details).  Review of the [place] CSRS was performed in accordance of the [place] prior to dispensing any controlled drugs.   Patient presented to the emergency department for suture removal. Vital signs and exam are reassuring. 17 sutures were removed and I am unable to see any more sutures over top of the large scab. I do not think I'm able to remove any portion of the scab to look for additional sutures without causing a significant amount of bleeding. Previous note states that 22 were placed. Laceration started oozing blood after sutures were removed. A small amount of hemostat was placed over open wound. Pressure dressing was applied. Patient is to follow up with wound care as directed. Patient is given ED precautions to return to the ED for any worsening or new symptoms.     ____________________________________________  FINAL CLINICAL IMPRESSION(S) / ED DIAGNOSES  Final diagnoses:  Visit for suture removal      NEW MEDICATIONS STARTED DURING THIS VISIT:  This SmartLink is deprecated. Use AVSMEDLIST instead to display the medication list for a patient.      This chart was dictated using voice recognition software/Dragon. Despite best efforts to proofread, errors can occur which can change the meaning. Any change was purely unintentional.    Laban Emperor, PA-C 12/20/16 1607    Merlyn Lot, MD 12/21/16 1115

## 2016-12-20 NOTE — ED Triage Notes (Signed)
Patient presents to the ED to have stitches removed.  Patient had some bleeding to wound on Friday and wife states bandage is now slightly stuck to wound and she is afraid to take it off incase bleeding would start again.  Patient is in no obvious distress at this time.

## 2016-12-21 ENCOUNTER — Encounter: Payer: Self-pay | Admitting: Family Medicine

## 2016-12-21 ENCOUNTER — Other Ambulatory Visit: Payer: Self-pay

## 2016-12-21 ENCOUNTER — Emergency Department
Admission: EM | Admit: 2016-12-21 | Discharge: 2016-12-21 | Disposition: A | Discharge status: Home / Self Care | Payer: Medicare Other | Attending: Student in an Organized Health Care Education/Training Program | Admitting: Student in an Organized Health Care Education/Training Program

## 2016-12-21 ENCOUNTER — Ambulatory Visit (INDEPENDENT_AMBULATORY_CARE_PROVIDER_SITE_OTHER): Payer: Medicare Other | Admitting: Family Medicine

## 2016-12-21 VITALS — BP 104/58 | HR 74 | Temp 97.9°F | Resp 14 | Ht 70.0 in | Wt 181.2 lb

## 2016-12-21 DIAGNOSIS — K219 Gastro-esophageal reflux disease without esophagitis: Secondary | ICD-10-CM

## 2016-12-21 DIAGNOSIS — I10 Essential (primary) hypertension: Secondary | ICD-10-CM | POA: Diagnosis not present

## 2016-12-21 DIAGNOSIS — R140 Abdominal distension (gaseous): Secondary | ICD-10-CM

## 2016-12-21 DIAGNOSIS — Z8579 Personal history of other malignant neoplasms of lymphoid, hematopoietic and related tissues: Secondary | ICD-10-CM | POA: Insufficient documentation

## 2016-12-21 DIAGNOSIS — Z87891 Personal history of nicotine dependence: Secondary | ICD-10-CM | POA: Insufficient documentation

## 2016-12-21 DIAGNOSIS — Z79899 Other long term (current) drug therapy: Secondary | ICD-10-CM | POA: Diagnosis not present

## 2016-12-21 DIAGNOSIS — X58XXXD Exposure to other specified factors, subsequent encounter: Secondary | ICD-10-CM | POA: Diagnosis not present

## 2016-12-21 DIAGNOSIS — Z96651 Presence of right artificial knee joint: Secondary | ICD-10-CM | POA: Diagnosis not present

## 2016-12-21 DIAGNOSIS — Z7902 Long term (current) use of antithrombotics/antiplatelets: Secondary | ICD-10-CM | POA: Diagnosis not present

## 2016-12-21 DIAGNOSIS — S0101XD Laceration without foreign body of scalp, subsequent encounter: Secondary | ICD-10-CM | POA: Insufficient documentation

## 2016-12-21 DIAGNOSIS — I2510 Atherosclerotic heart disease of native coronary artery without angina pectoris: Secondary | ICD-10-CM

## 2016-12-21 LAB — CBC
HCT: 31.2 % — ABNORMAL LOW (ref 40.0–52.0)
Hemoglobin: 10 g/dL — ABNORMAL LOW (ref 13.0–18.0)
MCH: 27.6 pg (ref 26.0–34.0)
MCHC: 32.1 g/dL (ref 32.0–36.0)
MCV: 86.1 fL (ref 80.0–100.0)
Platelets: 259 10*3/uL (ref 150–440)
RBC: 3.63 MIL/uL — ABNORMAL LOW (ref 4.40–5.90)
RDW: 16.3 % — ABNORMAL HIGH (ref 11.5–14.5)
WBC: 15.2 10*3/uL — ABNORMAL HIGH (ref 3.8–10.6)

## 2016-12-21 LAB — PROTIME-INR
INR: 1.19
Prothrombin Time: 15 seconds (ref 11.4–15.2)

## 2016-12-21 MED ORDER — PANTOPRAZOLE SODIUM 40 MG PO TBEC: 40.0000 mg | DELAYED_RELEASE_TABLET | Freq: Every day | ORAL | 0 refills | Status: DC

## 2016-12-21 MED ORDER — BACITRACIN ZINC 500 UNIT/GM EX OINT
TOPICAL_OINTMENT | Freq: Once | CUTANEOUS | Status: AC
  Administered 2016-12-21: 1 via TOPICAL
  Filled 2016-12-21: qty 0.9

## 2016-12-21 MED ORDER — PROBIOTIC 250 MG PO CAPS: 1.0000 | ORAL_CAPSULE | Freq: Every day | ORAL | 1 refills | Status: DC

## 2016-12-21 MED ORDER — LIDOCAINE-EPINEPHRINE (PF) 2 %-1:200000 IJ SOLN
20.0000 mL | Freq: Once | INTRAMUSCULAR | Status: AC
  Administered 2016-12-21: 20 mL
  Filled 2016-12-21: qty 20

## 2016-12-21 MED ORDER — CEPHALEXIN 250 MG PO CAPS
250.0000 mg | ORAL_CAPSULE | Freq: Three times a day (TID) | ORAL | 0 refills | Status: DC
Start: 2016-12-21 — End: 2016-12-30

## 2016-12-21 NOTE — Progress Notes (Signed)
Name: [name]   MRN: [id number]    DOB: 08/14/1923   Date:12/21/2016       Progress Note  Subjective  Chief Complaint  Chief Complaint  Patient presents with  . Hospitalization Follow-up    [place] backwords and hit the speaker   . GI Problem    pain in the stomach due to anitiboitc from the hospital. Was taking off of it week ago. Getting better but still having problems  . Medication Refill    protonix    HPI  Pt. presents for hospital follow up, he was admitted for Altered Mental Status after being seen in the ER and receiving a shot of Norflex for low back pain. He was confused and lethargic, was brought in to the ER and was admitted, muscle relaxants were held, mental status is improved and is now back to baseline. He was discharged to live in assisted living.  Prior to the ER admission, he experienced a mechanical fall sustaining  a laceration to his scalp, he had sutures to control the bleeding, which were removed yesterday. He was prescribed Keflex for sutures and he reports feeling bloated, gas, anorexia, etc. He has been taking Beano, Mylanta, and Gas-X, which have helped relieve his stomach discomfort. He is also taking Protonix.     Past Medical History:  Diagnosis Date  . Anxiety   . BPH (benign prostatic hypertrophy)   . Carotid stenosis    a. 05/2015 Carotid U/S: mild nonobs atherosclerosis. No need for f/u.  Marland Kitchen Chronic back pain   . Coronary artery disease    a. 06/2013 Cath/PCI: LAD 50-70p (FFR 0.82-->PCI w/ 3.5x24 Promus DES), RCA subtotally occluded w/ L->R collats, LCX 30ost.  . Dyslipidemia   . GERD (gastroesophageal reflux disease)   . Hx of [name]   . Hyperlipidemia   . Hypertension   . Inguinal hernia   . Lightheadedness    a. chronic, somewhat positional.  . 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 Major depression   . Multiple myeloma ([place])   . Pleural effusion    left  . Sleep apnea     Past Surgical History:  Procedure Laterality Date  .  APPENDECTOMY    . CARDIAC CATHETERIZATION  2002  . CARDIAC CATHETERIZATION  06/2013  . COLONOSCOPY    . FEMUR FRACTURE SURGERY Left    with rod  . HIP FRACTURE SURGERY    . JOINT REPLACEMENT     right partial knee replacement    Family History  Problem Relation Age of Onset  . Heart disease Mother   . Stroke Mother   . Diabetes Father   . Dementia Father   . Stroke Sister   . Stroke Brother   . Stroke Sister   . Stroke Sister   . Stroke Brother   . Stroke Brother     Social History   Socioeconomic History  . Marital status: Married    Spouse name: Not on file  . Number of children: Not on file  . Years of education: Not on file  . Highest education level: Not on file  Social Needs  . Financial resource strain: Not on file  . Food insecurity - worry: Not on file  . Food insecurity - inability: Not on file  . Transportation needs - medical: Not on file  . Transportation needs - non-medical: Not on file  Occupational History  . Not on file  Tobacco Use  . Smoking status: Former Smoker  Packs/day: 2.00    Years: 7.00    Pack years: 14.00    Types: Cigarettes    Last attempt to quit: 09/17/1953    Years since quitting: 63.3  . Smokeless tobacco: Never Used  Substance and Sexual Activity  . Alcohol use: No    Alcohol/week: 0.0 oz  . Drug use: No  . Sexual activity: Not Currently  Other Topics Concern  . Not on file  Social History Narrative  . Not on file     Current Outpatient Medications:  .  acetaminophen (TYLENOL) 500 MG tablet, Take 2 tablets (1,000 mg total) by mouth every 6 (six) hours., Disp: 30 tablet, Rfl: 0 .  bacitracin 500 UNIT/GM ointment, Apply 1 application topically 2 (two) times daily., Disp: 15 g, Rfl: 0 .  clonazePAM (KLONOPIN) 0.5 MG tablet, Take 1 tablet (0.5 mg total) by mouth 3 (three) times daily as needed for anxiety., Disp: 90 tablet, Rfl: 2 .  doxazosin (CARDURA) 1 MG tablet, Take 1 tablet (1 mg total) by mouth daily., Disp: 90  tablet, Rfl: 0 .  isosorbide mononitrate (IMDUR) 30 MG 24 hr tablet, Take 1 tablet (30 mg total) by mouth daily., Disp: 90 tablet, Rfl: 1 .  lidocaine (LIDODERM) 5 %, Place 1 patch onto the skin daily. Remove & Discard patch within 12 hours or as directed by MD, Disp: 30 patch, Rfl: 1 .  metoprolol tartrate (LOPRESSOR) 25 MG tablet, 50 mg po qAM, 25 mg po qHS (Patient taking differently: Take 25-50 mg 3 (three) times daily by mouth. 50 mg po qAM, 25 mg po qHS), Disp: 270 tablet, Rfl: 0 .  Multiple Vitamins-Minerals (PRESERVISION/LUTEIN PO), Take by mouth 2 (two) times daily., Disp: , Rfl:  .  nitroGLYCERIN (NITROSTAT) 0.4 MG SL tablet, Place 0.4 mg under the tongue every 5 (five) minutes as needed for chest pain., Disp: , Rfl:  .  pantoprazole (PROTONIX) 40 MG tablet, Take 1 tablet (40 mg total) by mouth daily., Disp: 90 tablet, Rfl: 0 .  polyethylene glycol (MIRALAX / GLYCOLAX) packet, Take 17 g by mouth daily., Disp: , Rfl:  .  pravastatin (PRAVACHOL) 40 MG tablet, Take 1 tablet (40 mg total) by mouth daily., Disp: 90 tablet, Rfl: 0 .  sertraline (ZOLOFT) 100 MG tablet, Take 1.5 tablets (150 mg total) by mouth at bedtime., Disp: 135 tablet, Rfl: 0 .  traMADol (ULTRAM) 50 MG tablet, Take 25 mg 3 (three) times daily by mouth. , Disp: , Rfl:   Allergies  Allergen Reactions  . Sulfamethoxazole-Trimethoprim Nausea Only and Other (See Comments)  . Baclofen Other (See Comments)    Confusion and weakness  . Cyclobenzaprine Other (See Comments)    Hallucination  . Nsaids     Avoids due to liver.  . Tolmetin Other (See Comments)    Avoids due to liver.     ROS  Please see HPI for complete discussion of ROS.  Objective  Vitals:   12/21/16 1133 12/21/16 1203  BP: (!) 99/52 (!) 104/58  Pulse: 74   Resp: 14   Temp: 97.9 F (36.6 C)   TempSrc: Oral   SpO2: 95%   Weight: 181 lb 3.2 oz (82.2 kg)   Height: '5\' 10"'$  (1.778 m)     Physical Exam  Constitutional: He is well-developed,  well-nourished, and in no distress.  sitting on wheelchair.  HENT:  Head: Head is with laceration.    Laceration with dressing around it, active bledding  Cardiovascular: Normal rate, regular rhythm,  S1 normal, S2 normal and normal heart sounds.  No murmur heard. Pulmonary/Chest: Effort normal. He has wheezes in the right lower field and the left lower field.  Abdominal: Soft. Bowel sounds are normal. There is no tenderness.  Nursing note and vitals reviewed.    Recent Results (from the past 2160 hour(s))  CBC     Status: Abnormal   Collection Time: 12/05/16  1:47 PM  Result Value Ref Range   WBC 10.2 3.8 - 10.6 K/uL   RBC 3.49 (L) 4.40 - 5.90 MIL/uL   Hemoglobin 9.9 (L) 13.0 - 18.0 g/dL   HCT 29.3 (L) 40.0 - 52.0 %   MCV 84.0 80.0 - 100.0 fL   MCH 28.4 26.0 - 34.0 pg   MCHC 33.8 32.0 - 36.0 g/dL   RDW 15.6 (H) 11.5 - 14.5 %   Platelets 207 150 - 440 K/uL  Basic metabolic panel     Status: Abnormal   Collection Time: 12/05/16  1:47 PM  Result Value Ref Range   Sodium 135 135 - 145 mmol/L   Potassium 4.4 3.5 - 5.1 mmol/L   Chloride 99 (L) 101 - 111 mmol/L   CO2 26 22 - 32 mmol/L   Glucose, Bld 98 65 - 99 mg/dL   BUN 23 (H) 6 - 20 mg/dL   Creatinine, Ser 1.40 (H) 0.61 - 1.24 mg/dL   Calcium 8.9 8.9 - 10.3 mg/dL   GFR calc non Af Amer 42 (L) >60 mL/min   GFR calc Af Amer 49 (L) >60 mL/min    Comment: (NOTE) The eGFR has been calculated using the CKD EPI equation. This calculation has not been validated in all clinical situations. eGFR's persistently <60 mL/min signify possible Chronic Kidney Disease.    Anion gap 10 5 - 15  CBC     Status: Abnormal   Collection Time: 12/07/16  5:31 AM  Result Value Ref Range   WBC 12.5 (H) 3.8 - 10.6 K/uL   RBC 3.45 (L) 4.40 - 5.90 MIL/uL   Hemoglobin 9.6 (L) 13.0 - 18.0 g/dL   HCT 29.0 (L) 40.0 - 52.0 %   MCV 84.1 80.0 - 100.0 fL   MCH 27.7 26.0 - 34.0 pg   MCHC 33.0 32.0 - 36.0 g/dL   RDW 16.1 (H) 11.5 - 14.5 %   Platelets  198 150 - 440 K/uL  Basic metabolic panel     Status: Abnormal   Collection Time: 12/07/16  5:31 AM  Result Value Ref Range   Sodium 136 135 - 145 mmol/L   Potassium 4.4 3.5 - 5.1 mmol/L   Chloride 103 101 - 111 mmol/L   CO2 25 22 - 32 mmol/L   Glucose, Bld 125 (H) 65 - 99 mg/dL   BUN 24 (H) 6 - 20 mg/dL   Creatinine, Ser 1.65 (H) 0.61 - 1.24 mg/dL   Calcium 9.2 8.9 - 10.3 mg/dL   GFR calc non Af Amer 35 (L) >60 mL/min   GFR calc Af Amer 40 (L) >60 mL/min    Comment: (NOTE) The eGFR has been calculated using the CKD EPI equation. This calculation has not been validated in all clinical situations. eGFR's persistently <60 mL/min signify possible Chronic Kidney Disease.    Anion gap 8 5 - 15  Troponin I     Status: None   Collection Time: 12/07/16  5:31 AM  Result Value Ref Range   Troponin I <0.03 <0.03 ng/mL  Urinalysis, Complete w Microscopic  Status: Abnormal   Collection Time: 12/07/16  6:21 AM  Result Value Ref Range   Color, Urine YELLOW (A) YELLOW   APPearance CLEAR (A) CLEAR   Specific Gravity, Urine 1.024 1.005 - 1.030   pH 5.0 5.0 - 8.0   Glucose, UA NEGATIVE NEGATIVE mg/dL   Hgb urine dipstick NEGATIVE NEGATIVE   Bilirubin Urine NEGATIVE NEGATIVE   Ketones, ur NEGATIVE NEGATIVE mg/dL   Protein, ur NEGATIVE NEGATIVE mg/dL   Nitrite NEGATIVE NEGATIVE   Leukocytes, UA NEGATIVE NEGATIVE   RBC / HPF NONE SEEN 0 - 5 RBC/hpf   WBC, UA 0-5 0 - 5 WBC/hpf   Bacteria, UA NONE SEEN NONE SEEN   Squamous Epithelial / LPF NONE SEEN NONE SEEN   Mucus PRESENT      Assessment & Plan  1. Gastroesophageal reflux disease without esophagitis Stable on PPI - pantoprazole (PROTONIX) 40 MG tablet; Take 1 tablet (40 mg total) daily by mouth.  Dispense: 90 tablet; Refill: 0  2. Laceration of scalp, subsequent encounter Active bleeding, applied pressure, pt. referred to ER.  3. Bloating    Rendy Lazard Asad A[facility]  [facility] 12/21/2016 12:12 PM

## 2016-12-21 NOTE — ED Notes (Signed)
Dr. Quentin Cornwall at bedside to suture back head laceration.

## 2016-12-21 NOTE — ED Triage Notes (Signed)
Pt arrives to ED via [facility] from [facility] practice. Per EMS pt fell about a month ago and had 16 stitches to L top and L back of head. Denies blood thinner use. States yesterday went to have a few stitches removed and laceration started oozing blood. Went back today for more stitches to be removed and started bleeding more heavy. Noted saturated head wrap from [facility]. Pt cleaned of blood to posterior head, face, neck, and hands.

## 2016-12-21 NOTE — Discharge Instructions (Signed)
Place antibiotic ointment to laceration twice daily.  Follow up with wound care.  Apply pressure to scalp for bleeding. Follow up with any additional questions or concerns.

## 2016-12-21 NOTE — ED Notes (Signed)
Pt head wrapped with gauze. No bleeding noted at current time.

## 2016-12-21 NOTE — ED Provider Notes (Signed)
[facility] Emergency Department Provider Note    First MD Initiated Contact with Patient 12/21/16 1316     (approximate)  I have reviewed the triage vital signs and the nursing notes.   HISTORY  Chief Complaint Head Laceration    HPI [name] is a 83 y.o. male with the above listed chronic medical conditions resents with persistent bleeding from poorly healing scalp laceration.  Denies any interval falls.  Was recently started on antibiotics for concern for possible infection related to laceration but has stopped taking them.  Denies any fevers.  Is not on any blood thinners.  Denies any interval trauma.  Was here yesterday to have sutures removed and since having the started having significant bleeding.  Denies any pain.  Past Medical History:  Diagnosis Date  . Anxiety   . BPH (benign prostatic hypertrophy)   . Carotid stenosis    a. 05/2015 Carotid U/S: mild nonobs atherosclerosis. No need for f/u.  Marland Kitchen Chronic back pain   . Coronary artery disease    a. 06/2013 Cath/PCI: LAD 50-70p (FFR 0.82-->PCI w/ 3.5x24 Promus DES), RCA subtotally occluded w/ L->R collats, LCX 30ost.  . Dyslipidemia   . GERD (gastroesophageal reflux disease)   . Hx of [name]   . Hyperlipidemia   . Hypertension   . Inguinal hernia   . Lightheadedness    a. chronic, somewhat positional.  . 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 Major depression   . Multiple myeloma ([place])   . Pleural effusion    left  . Sleep apnea    Family History  Problem Relation Age of Onset  . Heart disease Mother   . Stroke Mother   . Diabetes Father   . Dementia Father   . Stroke Sister   . Stroke Brother   . Stroke Sister   . Stroke Sister   . Stroke Brother   . Stroke Brother    Past Surgical History:  Procedure Laterality Date  . APPENDECTOMY    . CARDIAC CATHETERIZATION  2002  . CARDIAC CATHETERIZATION  06/2013  . COLONOSCOPY    . FEMUR FRACTURE SURGERY Left    with rod  . HIP  FRACTURE SURGERY    . JOINT REPLACEMENT     right partial knee replacement   Patient Active Problem List   Diagnosis Date Noted  . Altered mental status 12/07/2016  . Acute kidney injury ([place]) 09/14/2016  . Head injury, intracranial, without loss of consciousness or fracture ([place]) 09/07/2016  . Groin pain, left 06/15/2016  . Closed right hip fracture, with routine healing, subsequent encounter 04/15/2016  . Productive cough 03/23/2016  . Medicare annual wellness visit, subsequent 02/19/2016  . Major depression 10/14/2015  . Behavior disturbance 09/12/2015  . Mobility impaired 08/27/2015  . Skin ulcer of back ([place]) 07/01/2015  . Laceration of right hand 07/01/2015  . Traumatic hematoma of lower back 07/01/2015  . Encounters for administrative purpose 04/01/2015  . Skin lesion of face 12/24/2014  . Contusion of right hand 11/28/2014  . Herpes zoster 10/30/2014  . Need for immunization against influenza 10/24/2014  . Compression fracture of L4 lumbar vertebra ([facility]) 09/28/2014  . Rectal or anal pain 08/13/2014  . Anxiety disorder 07/23/2014  . BPH without obstruction/lower urinary tract symptoms 07/23/2014  . Atherosclerosis of coronary artery 07/23/2014  . Chronic LBP 07/23/2014  . Hypercholesteremia 07/23/2014  . Benign hypertension 07/23/2014  . Acid reflux 07/23/2014  . Chronic recurrent major depressive disorder (  [place]) 07/23/2014  . GERD (gastroesophageal reflux disease) 07/23/2014  . Coronary artery disease involving native coronary artery without angina pectoris 07/06/2014  . Essential hypertension 07/06/2014  . Carotid stenosis 07/06/2014  . Hyperlipidemia   . H/O [name] 12/20/2011      Prior to Admission medications   Medication Sig Start Date End Date Taking? Authorizing Provider  acetaminophen (TYLENOL) 500 MG tablet Take 2 tablets (1,000 mg total) by mouth every 6 (six) hours. 04/17/16  Yes Dustin Flock, MD  clonazePAM (KLONOPIN) 0.5 MG tablet Take 1  tablet (0.5 mg total) by mouth 3 (three) times daily as needed for anxiety. 11/06/16  Yes Roselee Nova, MD  doxazosin (CARDURA) 1 MG tablet Take 1 tablet (1 mg total) by mouth daily. 11/24/16  Yes Roselee Nova, MD  isosorbide mononitrate (IMDUR) 30 MG 24 hr tablet Take 1 tablet (30 mg total) by mouth daily. 09/07/16  Yes Keith Rake Asad A, MD  lidocaine (LIDODERM) 5 % Place 1 patch onto the skin daily. Remove & Discard patch within 12 hours or as directed by MD 12/09/16 12/09/17 Yes Dustin Flock, MD  metoprolol tartrate (LOPRESSOR) 25 MG tablet 50 mg po qAM, 25 mg po qHS Patient taking differently: Take 25-50 mg 2 (two) times daily by mouth. Take 2 tablets (32m) in the morning and 1 tablet (256m at bedtime 08/31/16  Yes ShRoselee NovaMD  Multiple Vitamins-Minerals (PRESERVISION/LUTEIN PO) Take by mouth 2 (two) times daily.   Yes [provider]  pantoprazole (PROTONIX) 40 MG tablet Take 1 tablet (40 mg total) daily by mouth. 12/21/16  Yes ShRoselee NovaMD  pravastatin (PRAVACHOL) 40 MG tablet Take 1 tablet (40 mg total) by mouth daily. 11/06/16  Yes ShRoselee NovaMD  sertraline (ZOLOFT) 100 MG tablet Take 1.5 tablets (150 mg total) by mouth at bedtime. 10/12/16  Yes ShRoselee NovaMD  traMADol (ULTRAM) 50 MG tablet Take 25 mg 3 (three) times daily by mouth.  07/10/16  Yes [provider]  bacitracin 500 UNIT/GM ointment Apply 1 application topically 2 (two) times daily. 11/24/16   ShRoselee NovaMD  cephALEXin (KEFLEX) 250 MG capsule Take 1 capsule (250 mg total) 3 (three) times daily for 10 days by mouth. 12/21/16 12/31/16  RoMerlyn LotMD  nitroGLYCERIN (NITROSTAT) 0.4 MG SL tablet Place 0.4 mg under the tongue every 5 (five) minutes as needed for chest pain.    [provider]  polyethylene glycol (MIRALAX / GLYCOLAX) packet Take 17 g by mouth daily.    [provider]  Saccharomyces boulardii (PROBIOTIC) 250 MG CAPS Take 1  tablet daily at 6 (six) AM by mouth. 12/21/16   RoMerlyn LotMD    Allergies Sulfamethoxazole-trimethoprim; Baclofen; Cyclobenzaprine; Nsaids; and Tolmetin    Social History Social History   Tobacco Use  . Smoking status: Former Smoker    Packs/day: 2.00    Years: 7.00    Pack years: 14.00    Types: Cigarettes    Last attempt to quit: 09/17/1953    Years since quitting: 63.3  . Smokeless tobacco: Never Used  Substance Use Topics  . Alcohol use: No    Alcohol/week: 0.0 oz  . Drug use: No    Review of Systems Patient denies headaches, rhinorrhea, blurry vision, numbness, shortness of breath, chest pain, edema, cough, abdominal pain, nausea, vomiting, diarrhea, dysuria, fevers, rashes or hallucinations unless otherwise stated above in HPI. ____________________________________________  PHYSICAL EXAM:  VITAL SIGNS: Vitals:   12/21/16 1600 12/21/16 1615  BP: 124/83 114/64  Pulse: 67 66  Resp:    Temp:    SpO2: 100% 95%    Constitutional: Alert and oriented. Blood soaked guaze dressing on scalp, no acute distress. Eyes: Conjunctivae are normal.  Head: Matted blood soaked hair the left parietal scalp.  After cutting here he does have partially healing scalp laceration with 2 remaining sutures that were removed but he does have persistent nonpulsatile bleeding coming from area of granulation tissue and not adhered to skin flap on the anterior aspect of the wound.  No evidence of significant purulence or drainage. Nose: No congestion/rhinnorhea. Mouth/Throat: Mucous membranes are moist.   Neck: No stridor. Painless ROM.  Cardiovascular: Normal rate, regular rhythm. Grossly normal heart sounds.  Good peripheral circulation. Respiratory: Normal respiratory effort.  No retractions. Lungs CTAB. Gastrointestinal: Soft and nontender. No distention. No abdominal bruits. No CVA tenderness. Musculoskeletal: No lower extremity tenderness nor edema.  No joint  effusions. Neurologic:  Normal speech and language. No gross focal neurologic deficits are appreciated. No facial droop Skin:  Skin is warm, dry and intact. No rash noted. Psychiatric: Mood and affect are normal. Speech and behavior are normal.  ____________________________________________   LABS (all labs ordered are listed, but only abnormal results are displayed)  Results for orders placed or performed during the hospital encounter of 12/21/16 (from the past 24 hour(s))  CBC     Status: Abnormal   Collection Time: 12/21/16  2:09 PM  Result Value Ref Range   WBC 15.2 (H) 3.8 - 10.6 K/uL   RBC 3.63 (L) 4.40 - 5.90 MIL/uL   Hemoglobin 10.0 (L) 13.0 - 18.0 g/dL   HCT 31.2 (L) 40.0 - 52.0 %   MCV 86.1 80.0 - 100.0 fL   MCH 27.6 26.0 - 34.0 pg   MCHC 32.1 32.0 - 36.0 g/dL   RDW 16.3 (H) 11.5 - 14.5 %   Platelets 259 150 - 440 K/uL  Protime-INR     Status: None   Collection Time: 12/21/16  2:09 PM  Result Value Ref Range   Prothrombin Time 15.0 11.4 - 15.2 seconds   INR 1.19    ____________________________________________ ____________________________________________  RADIOLOGY   ____________________________________________   PROCEDURES  Procedure(s) performed:  Marland KitchenMarland KitchenLaceration Repair Date/Time: 12/21/2016 3:57 PM Performed by: Merlyn Lot, MD Authorized by: Merlyn Lot, MD   Consent:    Consent obtained:  Verbal   Consent given by:  Patient   Risks discussed:  Infection, need for additional repair, nerve damage, pain, poor cosmetic result, poor wound healing and vascular damage   Alternatives discussed:  Delayed treatment and observation Anesthesia (see MAR for exact dosages):    Anesthesia method:  Local infiltration   Local anesthetic:  Lidocaine 2% WITH epi Laceration details:    Location:  Scalp   Scalp location:  L parietal   Length (cm):  4   Depth (mm):  4 Repair type:    Repair type:  Complex Exploration:    Limited defect created (wound  extended): yes     Hemostasis achieved with:  Direct pressure Treatment:    Area cleansed with:  Betadine   Amount of cleaning:  Standard   Debridement:  Moderate   Undermining:  None   Scar revision: yes   Subcutaneous repair:    Number of sutures:  2 Skin repair:    Repair method:  Staples and sutures   Suture size:  3-0   Suture material:  Nylon   Suture technique:  Retention suture   Number of sutures:  2   Number of staples:  12 Approximation:    Approximation:  Close Post-procedure details:    Dressing:  Antibiotic ointment       Critical Care performed: no ____________________________________________   INITIAL IMPRESSION / ASSESSMENT AND PLAN / ED COURSE  Pertinent labs & imaging results that were available during my care of the patient were reviewed by me and considered in my medical decision making (see chart for details).  DDX: laceration, dehiscence, cellulitis, abscess,   Taavi L Lusty is a 84 y.o. who presents to the ED with persistent bleeding from complex scalp laceration as described above.  Extensive debridement of granulation tissue and redundant skin flap was performed to more appropriately approximate skin borders.  After staples and extensive wound repair as described above the patient remained hemodynamically stable and the wound was hemostatic.  No evidence of acute blood loss anemia.  Patient's home care nurse was at bedside and I verbally explained wound care instructions to her.  Patient will be seen this afternoon by home wound care specialist.  Patient will follow-up in wound care as I do suspect this patient may benefit from wound VAC placement due to poor healing.  Will give more appropriate prescription for Keflex as well as a probiotic and instructed patient to be taking this due to his increasing white count and poor skin healing due to concern for wound infection.  Discussed signs and symptoms for which he should return immediately to the.       ____________________________________________   FINAL CLINICAL IMPRESSION(S) / ED DIAGNOSES  Final diagnoses:  Laceration of scalp, subsequent encounter  Laceration of scalp without foreign body, subsequent encounter      NEW MEDICATIONS STARTED DURING THIS VISIT:  This SmartLink is deprecated. Use AVSMEDLIST instead to display the medication list for a patient.   Note:  This document was prepared using Dragon voice recognition software and may include unintentional dictation errors.    Merlyn Lot, MD 12/21/16 Despina Pole

## 2016-12-21 NOTE — ED Notes (Signed)
Called pharmacy to get lidocaine with epi sent to ED

## 2016-12-21 NOTE — ED Notes (Signed)
Pt states he lives at [facility].

## 2016-12-23 ENCOUNTER — Ambulatory Visit
Admission: RE | Admit: 2016-12-23 | Discharge: 2016-12-23 | Disposition: A | Discharge status: Home / Self Care | Payer: Medicare Other | Source: Ambulatory Visit | Attending: Physical Medicine and Rehabilitation | Admitting: Physical Medicine and Rehabilitation

## 2016-12-23 DIAGNOSIS — M546 Pain in thoracic spine: Secondary | ICD-10-CM | POA: Diagnosis present

## 2016-12-23 DIAGNOSIS — J90 Pleural effusion, not elsewhere classified: Secondary | ICD-10-CM | POA: Diagnosis not present

## 2016-12-23 DIAGNOSIS — R937 Abnormal findings on diagnostic imaging of other parts of musculoskeletal system: Secondary | ICD-10-CM | POA: Insufficient documentation

## 2016-12-23 DIAGNOSIS — M5124 Other intervertebral disc displacement, thoracic region: Secondary | ICD-10-CM | POA: Diagnosis not present

## 2016-12-29 ENCOUNTER — Encounter
Admission: RE | Admit: 2016-12-29 | Discharge: 2016-12-29 | Disposition: A | Discharge status: Home / Self Care | Payer: Medicare Other | Source: Ambulatory Visit | Attending: Orthopedic Surgery | Admitting: Orthopedic Surgery

## 2016-12-29 ENCOUNTER — Other Ambulatory Visit: Payer: Self-pay

## 2016-12-29 HISTORY — DX: Angina pectoris, unspecified: I20.9

## 2016-12-29 LAB — BASIC METABOLIC PANEL
Anion gap: 9 (ref 5–15)
BUN: 28 mg/dL — ABNORMAL HIGH (ref 6–20)
CO2: 26 mmol/L (ref 22–32)
Calcium: 9.1 mg/dL (ref 8.9–10.3)
Chloride: 102 mmol/L (ref 101–111)
Creatinine, Ser: 1.33 mg/dL — ABNORMAL HIGH (ref 0.61–1.24)
GFR calc Af Amer: 52 mL/min — ABNORMAL LOW (ref >60)
GFR calc non Af Amer: 45 mL/min — ABNORMAL LOW (ref >60)
Glucose, Bld: 90 mg/dL (ref 65–99)
Potassium: 4.7 mmol/L (ref 3.5–5.1)
Sodium: 137 mmol/L (ref 135–145)

## 2016-12-29 LAB — CBC
HCT: 30 % — ABNORMAL LOW (ref 40.0–52.0)
Hemoglobin: 9.8 g/dL — ABNORMAL LOW (ref 13.0–18.0)
MCH: 28.3 pg (ref 26.0–34.0)
MCHC: 32.6 g/dL (ref 32.0–36.0)
MCV: 87 fL (ref 80.0–100.0)
Platelets: 182 10*3/uL (ref 150–440)
RBC: 3.45 MIL/uL — ABNORMAL LOW (ref 4.40–5.90)
RDW: 16.6 % — ABNORMAL HIGH (ref 11.5–14.5)
WBC: 8.6 10*3/uL (ref 3.8–10.6)

## 2016-12-29 MED ORDER — CEFAZOLIN SODIUM-DEXTROSE 2-4 GM/100ML-% IV SOLN
2.0000 g | Freq: Once | INTRAVENOUS | Status: AC
  Administered 2016-12-30: 2 g via INTRAVENOUS

## 2016-12-29 NOTE — Patient Instructions (Signed)
  Your procedure is scheduled on: December 30, 2016  Report to the medical mall, 2nd floor  PLEASE ARRIVE AT 10:15 AM FOR SURGERY  Remember: Instructions that are not followed completely may result in serious medical risk, up to and including death, or upon the discretion of your surgeon and anesthesiologist your surgery may need to be rescheduled.     _X__ 1. Do not eat food after midnight the night before your procedure.                 No gum chewing or hard candies. You may drink clear liquids up to 2 hours                 before you are scheduled to arrive for your surgery- DO not drink clear                 liquids within 2 hours of the start of your surgery.                 Clear Liquids include:  water, apple juice without pulp, clear carbohydrate                 drink such as Clearfast of Gartorade, Black Coffee or Tea (Do not add                 anything to coffee or tea).     _X__ 2.  No Alcohol for 24 hours before or after surgery.   _X__ 3.  Do Not Smoke or use e-cigarettes For 24 Hours Prior to Your Surgery.                 Do not use any chewable tobacco products for at least 6 hours prior to                 surgery.  ____  4.  Bring all medications with you on the day of surgery if instructed.   ____  5.  Notify your doctor if there is any change in your medical condition      (cold, fever, infections).     Do not wear jewelry, make-up, hairpins, clips or nail polish. Do not wear lotions, powders, or perfumes. You may wear deodorant. Do not shave 48 hours prior to surgery. Men may shave face and neck. Do not bring valuables to the hospital.    [facility] is not responsible for any belongings or valuables.  Contacts, dentures or bridgework may not be worn into surgery. Leave your suitcase in the car. After surgery it may be brought to your room. For patients admitted to the hospital, discharge time is determined by your treatment  team.   Patients discharged the day of surgery will not be allowed to drive home.   Please read over the following fact sheets that you were given:   PREPARING FOR SURGERY       ____ Take these medicines the morning of surgery with A SIP OF WATER:    1. TYLENOL ALONG WITH TRAMADOL  2.  METOPROLOL  3.  IMDUR  4.  PROTONIX  5.  KLONOPIN  6.    ____ Fleet Enema (as directed)   _ X__ Use CHG Soap as directed  ___X  USE STOOL SOFTENERS AFTER SURGERY  DO NOT TAKE PROBIOTICS, BEANO, MAALOX IN THE MORNING.

## 2016-12-29 NOTE — Pre-Procedure Instructions (Signed)
Patient has had several episodes of abrasion on top of head, opening and bleeding excessively.  He went to the ER two days ago and currently has 12 staples and 2 stitches over the wound.  He wears a mesh cap on top to keep it covered.  Bleeding has stopped since most recent repair.

## 2016-12-30 ENCOUNTER — Ambulatory Visit: Payer: Medicare Other | Admitting: Anesthesiology

## 2016-12-30 ENCOUNTER — Other Ambulatory Visit: Payer: Self-pay | Admitting: Family Medicine

## 2016-12-30 ENCOUNTER — Ambulatory Visit: Payer: Medicare Other

## 2016-12-30 ENCOUNTER — Encounter
Admission: RE | Disposition: A | Discharge status: Home Health | Payer: Self-pay | Source: Ambulatory Visit | Attending: Internal Medicine

## 2016-12-30 ENCOUNTER — Encounter: Payer: Self-pay | Admitting: *Deleted

## 2016-12-30 ENCOUNTER — Inpatient Hospital Stay
Admission: RE | Admit: 2016-12-30 | Discharge: 2017-01-01 | DRG: 477 | Disposition: A | Discharge status: Home Health | Payer: Medicare Other | Source: Ambulatory Visit | Attending: Internal Medicine | Admitting: Internal Medicine

## 2016-12-30 DIAGNOSIS — K219 Gastro-esophageal reflux disease without esophagitis: Secondary | ICD-10-CM | POA: Diagnosis present

## 2016-12-30 DIAGNOSIS — Z79899 Other long term (current) drug therapy: Secondary | ICD-10-CM

## 2016-12-30 DIAGNOSIS — J9601 Acute respiratory failure with hypoxia: Secondary | ICD-10-CM

## 2016-12-30 DIAGNOSIS — R042 Hemoptysis: Secondary | ICD-10-CM | POA: Diagnosis not present

## 2016-12-30 DIAGNOSIS — Z883 Allergy status to other anti-infective agents status: Secondary | ICD-10-CM

## 2016-12-30 DIAGNOSIS — Z7409 Other reduced mobility: Secondary | ICD-10-CM

## 2016-12-30 DIAGNOSIS — I2510 Atherosclerotic heart disease of native coronary artery without angina pectoris: Secondary | ICD-10-CM | POA: Diagnosis present

## 2016-12-30 DIAGNOSIS — R0902 Hypoxemia: Secondary | ICD-10-CM

## 2016-12-30 DIAGNOSIS — G510 Bell's palsy: Secondary | ICD-10-CM | POA: Diagnosis present

## 2016-12-30 DIAGNOSIS — F329 Major depressive disorder, single episode, unspecified: Secondary | ICD-10-CM | POA: Diagnosis present

## 2016-12-30 DIAGNOSIS — E8770 Fluid overload, unspecified: Secondary | ICD-10-CM | POA: Diagnosis present

## 2016-12-30 DIAGNOSIS — R55 Syncope and collapse: Secondary | ICD-10-CM | POA: Diagnosis present

## 2016-12-30 DIAGNOSIS — J189 Pneumonia, unspecified organism: Secondary | ICD-10-CM | POA: Diagnosis not present

## 2016-12-30 DIAGNOSIS — S22070A Wedge compression fracture of T9-T10 vertebra, initial encounter for closed fracture: Principal | ICD-10-CM | POA: Diagnosis present

## 2016-12-30 DIAGNOSIS — Z87891 Personal history of nicotine dependence: Secondary | ICD-10-CM

## 2016-12-30 DIAGNOSIS — I10 Essential (primary) hypertension: Secondary | ICD-10-CM

## 2016-12-30 DIAGNOSIS — J9600 Acute respiratory failure, unspecified whether with hypoxia or hypercapnia: Secondary | ICD-10-CM | POA: Diagnosis present

## 2016-12-30 DIAGNOSIS — N400 Enlarged prostate without lower urinary tract symptoms: Secondary | ICD-10-CM | POA: Diagnosis present

## 2016-12-30 DIAGNOSIS — R7981 Abnormal blood-gas level: Secondary | ICD-10-CM

## 2016-12-30 DIAGNOSIS — Z955 Presence of coronary angioplasty implant and graft: Secondary | ICD-10-CM

## 2016-12-30 DIAGNOSIS — F419 Anxiety disorder, unspecified: Secondary | ICD-10-CM | POA: Diagnosis present

## 2016-12-30 DIAGNOSIS — Z886 Allergy status to analgesic agent status: Secondary | ICD-10-CM

## 2016-12-30 DIAGNOSIS — I129 Hypertensive chronic kidney disease with stage 1 through stage 4 chronic kidney disease, or unspecified chronic kidney disease: Secondary | ICD-10-CM | POA: Diagnosis present

## 2016-12-30 DIAGNOSIS — J95821 Acute postprocedural respiratory failure: Secondary | ICD-10-CM | POA: Diagnosis present

## 2016-12-30 DIAGNOSIS — G8929 Other chronic pain: Secondary | ICD-10-CM | POA: Diagnosis present

## 2016-12-30 DIAGNOSIS — E785 Hyperlipidemia, unspecified: Secondary | ICD-10-CM | POA: Diagnosis present

## 2016-12-30 DIAGNOSIS — S22079A Unspecified fracture of T9-T10 vertebra, initial encounter for closed fracture: Secondary | ICD-10-CM

## 2016-12-30 DIAGNOSIS — Z8249 Family history of ischemic heart disease and other diseases of the circulatory system: Secondary | ICD-10-CM

## 2016-12-30 DIAGNOSIS — Z96653 Presence of artificial knee joint, bilateral: Secondary | ICD-10-CM | POA: Diagnosis present

## 2016-12-30 DIAGNOSIS — N183 Chronic kidney disease, stage 3 (moderate): Secondary | ICD-10-CM | POA: Diagnosis present

## 2016-12-30 DIAGNOSIS — Z79891 Long term (current) use of opiate analgesic: Secondary | ICD-10-CM

## 2016-12-30 DIAGNOSIS — G4730 Sleep apnea, unspecified: Secondary | ICD-10-CM | POA: Diagnosis present

## 2016-12-30 DIAGNOSIS — Z888 Allergy status to other drugs, medicaments and biological substances status: Secondary | ICD-10-CM

## 2016-12-30 DIAGNOSIS — Y95 Nosocomial condition: Secondary | ICD-10-CM | POA: Diagnosis present

## 2016-12-30 DIAGNOSIS — J9811 Atelectasis: Secondary | ICD-10-CM | POA: Diagnosis not present

## 2016-12-30 DIAGNOSIS — J90 Pleural effusion, not elsewhere classified: Secondary | ICD-10-CM | POA: Diagnosis present

## 2016-12-30 HISTORY — PX: KYPHOPLASTY: SHX5884

## 2016-12-30 SURGERY — KYPHOPLASTY
Anesthesia: General | Site: Back | Wound class: Clean

## 2016-12-30 MED ORDER — SODIUM CHLORIDE FLUSH 0.9 % IV SOLN
INTRAVENOUS | Status: AC
  Filled 2016-12-30: qty 3

## 2016-12-30 MED ORDER — IOPAMIDOL (ISOVUE-M 200) INJECTION 41%
INTRAMUSCULAR | Status: AC
  Filled 2016-12-30: qty 20

## 2016-12-30 MED ORDER — IPRATROPIUM-ALBUTEROL 0.5-2.5 (3) MG/3ML IN SOLN
3.0000 mL | Freq: Once | RESPIRATORY_TRACT | Status: AC
  Administered 2016-12-30: 3 mL via RESPIRATORY_TRACT

## 2016-12-30 MED ORDER — MIDAZOLAM HCL 2 MG/2ML IJ SOLN
INTRAMUSCULAR | Status: AC
  Filled 2016-12-30: qty 2

## 2016-12-30 MED ORDER — IPRATROPIUM-ALBUTEROL 0.5-2.5 (3) MG/3ML IN SOLN
RESPIRATORY_TRACT | Status: AC
  Administered 2016-12-30: 3 mL via RESPIRATORY_TRACT
  Filled 2016-12-30: qty 3

## 2016-12-30 MED ORDER — OXYCODONE HCL 5 MG PO TABS
5.0000 mg | ORAL_TABLET | ORAL | Status: DC | PRN
  Administered 2016-12-30 – 2017-01-01 (×4): 5 mg via ORAL
  Filled 2016-12-30 (×4): qty 1

## 2016-12-30 MED ORDER — SACCHAROMYCES BOULARDII 250 MG PO CAPS
250.0000 mg | ORAL_CAPSULE | Freq: Every day | ORAL | Status: DC
  Administered 2016-12-31 – 2017-01-01 (×2): 250 mg via ORAL
  Filled 2016-12-30 (×2): qty 1

## 2016-12-30 MED ORDER — FUROSEMIDE 10 MG/ML IJ SOLN
40.0000 mg | Freq: Every day | INTRAMUSCULAR | Status: DC
  Administered 2016-12-31 – 2017-01-01 (×2): 40 mg via INTRAVENOUS
  Filled 2016-12-30 (×2): qty 4

## 2016-12-30 MED ORDER — ENOXAPARIN SODIUM 40 MG/0.4ML ~~LOC~~ SOLN
40.0000 mg | SUBCUTANEOUS | Status: DC
  Administered 2016-12-31: 40 mg via SUBCUTANEOUS
  Filled 2016-12-30: qty 0.4

## 2016-12-30 MED ORDER — ACETAMINOPHEN 325 MG PO TABS: 650.0000 mg | ORAL_TABLET | Freq: Four times a day (QID) | ORAL | Status: DC | PRN

## 2016-12-30 MED ORDER — PROPOFOL 500 MG/50ML IV EMUL
INTRAVENOUS | Status: DC | PRN
  Administered 2016-12-30: 25 ug/kg/min via INTRAVENOUS

## 2016-12-30 MED ORDER — METOPROLOL TARTRATE 25 MG PO TABS: 25.0000 mg | ORAL_TABLET | Freq: Two times a day (BID) | ORAL | 0 refills | Status: DC

## 2016-12-30 MED ORDER — FENTANYL CITRATE (PF) 100 MCG/2ML IJ SOLN: 25.0000 ug | INTRAMUSCULAR | Status: DC | PRN

## 2016-12-30 MED ORDER — OXYCODONE HCL 5 MG/5ML PO SOLN: 5.0000 mg | Freq: Once | ORAL | Status: DC | PRN

## 2016-12-30 MED ORDER — OCUVITE-LUTEIN PO CAPS
1.0000 | ORAL_CAPSULE | Freq: Two times a day (BID) | ORAL | Status: DC
  Administered 2016-12-30 – 2017-01-01 (×4): 1 via ORAL
  Filled 2016-12-30 (×5): qty 1

## 2016-12-30 MED ORDER — PROPOFOL 10 MG/ML IV BOLUS
INTRAVENOUS | Status: DC | PRN
  Administered 2016-12-30: 30 mg via INTRAVENOUS

## 2016-12-30 MED ORDER — IPRATROPIUM-ALBUTEROL 0.5-2.5 (3) MG/3ML IN SOLN
RESPIRATORY_TRACT | Status: AC
  Filled 2016-12-30: qty 3

## 2016-12-30 MED ORDER — SERTRALINE HCL 50 MG PO TABS
150.0000 mg | ORAL_TABLET | Freq: Every day | ORAL | Status: DC
  Administered 2016-12-30 – 2016-12-31 (×2): 150 mg via ORAL
  Filled 2016-12-30 (×3): qty 1

## 2016-12-30 MED ORDER — LIDOCAINE HCL 1 % IJ SOLN
INTRAMUSCULAR | Status: DC | PRN
  Administered 2016-12-30: 25 mL

## 2016-12-30 MED ORDER — ACETAMINOPHEN 650 MG RE SUPP
650.0000 mg | Freq: Four times a day (QID) | RECTAL | Status: DC | PRN
  Filled 2016-12-30: qty 1

## 2016-12-30 MED ORDER — FUROSEMIDE 10 MG/ML IJ SOLN
INTRAMUSCULAR | Status: AC
  Administered 2016-12-30: 10 mg via INTRAVENOUS
  Filled 2016-12-30: qty 2

## 2016-12-30 MED ORDER — LACTATED RINGERS IV SOLN
INTRAVENOUS | Status: DC
  Administered 2016-12-30: 11:00:00 via INTRAVENOUS

## 2016-12-30 MED ORDER — BUDESONIDE 0.5 MG/2ML IN SUSP
0.5000 mg | Freq: Two times a day (BID) | RESPIRATORY_TRACT | Status: DC
  Administered 2016-12-30 – 2017-01-01 (×4): 0.5 mg via RESPIRATORY_TRACT
  Filled 2016-12-30 (×4): qty 2

## 2016-12-30 MED ORDER — PRAVASTATIN SODIUM 40 MG PO TABS
40.0000 mg | ORAL_TABLET | Freq: Every evening | ORAL | Status: DC
  Administered 2016-12-30 – 2016-12-31 (×2): 40 mg via ORAL
  Filled 2016-12-30 (×2): qty 1

## 2016-12-30 MED ORDER — OXYCODONE HCL 5 MG PO TABS: 5.0000 mg | ORAL_TABLET | Freq: Once | ORAL | Status: DC | PRN

## 2016-12-30 MED ORDER — FLUMAZENIL 0.5 MG/5ML IV SOLN
0.2000 mg | Freq: Once | INTRAVENOUS | Status: AC
  Administered 2016-12-30: 0.2 mg via INTRAVENOUS

## 2016-12-30 MED ORDER — LIDOCAINE HCL (PF) 1 % IJ SOLN
INTRAMUSCULAR | Status: AC
  Filled 2016-12-30: qty 60

## 2016-12-30 MED ORDER — CEFAZOLIN SODIUM-DEXTROSE 2-4 GM/100ML-% IV SOLN
INTRAVENOUS | Status: AC
  Filled 2016-12-30: qty 100

## 2016-12-30 MED ORDER — IPRATROPIUM-ALBUTEROL 0.5-2.5 (3) MG/3ML IN SOLN
3.0000 mL | Freq: Four times a day (QID) | RESPIRATORY_TRACT | Status: DC
  Administered 2016-12-30 – 2017-01-01 (×7): 3 mL via RESPIRATORY_TRACT
  Filled 2016-12-30 (×7): qty 3

## 2016-12-30 MED ORDER — SEVOFLURANE IN SOLN
RESPIRATORY_TRACT | Status: AC
Start: 2016-12-30 — End: ?
  Filled 2016-12-30: qty 250

## 2016-12-30 MED ORDER — POLYETHYLENE GLYCOL 3350 17 G PO PACK
17.0000 g | PACK | Freq: Every day | ORAL | Status: DC
  Administered 2016-12-31 – 2017-01-01 (×2): 17 g via ORAL
  Filled 2016-12-30 (×2): qty 1

## 2016-12-30 MED ORDER — NITROGLYCERIN 0.4 MG SL SUBL: 0.4000 mg | SUBLINGUAL_TABLET | SUBLINGUAL | Status: DC | PRN

## 2016-12-30 MED ORDER — BUPIVACAINE-EPINEPHRINE (PF) 0.5% -1:200000 IJ SOLN
INTRAMUSCULAR | Status: AC
  Filled 2016-12-30: qty 30

## 2016-12-30 MED ORDER — FLUMAZENIL 0.5 MG/5ML IV SOLN
INTRAVENOUS | Status: AC
  Administered 2016-12-30: 0.2 mg via INTRAVENOUS
  Filled 2016-12-30: qty 5

## 2016-12-30 MED ORDER — SODIUM CHLORIDE FLUSH 0.9 % IV SOLN
INTRAVENOUS | Status: AC
  Filled 2016-12-30: qty 10

## 2016-12-30 MED ORDER — PROPOFOL 10 MG/ML IV BOLUS
INTRAVENOUS | Status: AC
  Filled 2016-12-30: qty 20

## 2016-12-30 MED ORDER — DOXAZOSIN MESYLATE 1 MG PO TABS
1.0000 mg | ORAL_TABLET | Freq: Every day | ORAL | Status: DC
  Filled 2016-12-30: qty 1

## 2016-12-30 MED ORDER — FUROSEMIDE 10 MG/ML IJ SOLN
40.0000 mg | Freq: Once | INTRAMUSCULAR | Status: AC
  Administered 2016-12-30: 40 mg via INTRAVENOUS

## 2016-12-30 MED ORDER — ISOSORBIDE MONONITRATE ER 30 MG PO TB24
30.0000 mg | ORAL_TABLET | Freq: Every day | ORAL | Status: DC
  Administered 2016-12-31: 30 mg via ORAL
  Filled 2016-12-30 (×2): qty 1

## 2016-12-30 MED ORDER — DIPHENHYDRAMINE HCL 25 MG PO CAPS
25.0000 mg | ORAL_CAPSULE | Freq: Every evening | ORAL | Status: DC | PRN
  Administered 2016-12-30: 25 mg via ORAL
  Filled 2016-12-30: qty 1

## 2016-12-30 MED ORDER — METOPROLOL TARTRATE 25 MG PO TABS
25.0000 mg | ORAL_TABLET | Freq: Two times a day (BID) | ORAL | Status: DC
  Administered 2016-12-30 – 2016-12-31 (×3): 25 mg via ORAL
  Filled 2016-12-30 (×4): qty 1

## 2016-12-30 MED ORDER — ORAL CARE MOUTH RINSE
15.0000 mL | Freq: Two times a day (BID) | OROMUCOSAL | Status: DC
  Administered 2016-12-30 – 2016-12-31 (×2): 15 mL via OROMUCOSAL

## 2016-12-30 MED ORDER — PANTOPRAZOLE SODIUM 40 MG PO TBEC: 40.0000 mg | DELAYED_RELEASE_TABLET | Freq: Every day | ORAL | Status: DC

## 2016-12-30 MED ORDER — FUROSEMIDE 10 MG/ML IJ SOLN
INTRAMUSCULAR | Status: AC
  Administered 2016-12-30: 40 mg via INTRAVENOUS
  Filled 2016-12-30: qty 4

## 2016-12-30 MED ORDER — MIDAZOLAM HCL 2 MG/2ML IJ SOLN
INTRAMUSCULAR | Status: DC | PRN
  Administered 2016-12-30: 2 mg via INTRAVENOUS

## 2016-12-30 MED ORDER — CLONAZEPAM 0.5 MG PO TABS
0.5000 mg | ORAL_TABLET | Freq: Three times a day (TID) | ORAL | Status: DC | PRN
  Administered 2016-12-30 – 2017-01-01 (×5): 0.5 mg via ORAL
  Filled 2016-12-30 (×6): qty 1

## 2016-12-30 MED ORDER — FUROSEMIDE 10 MG/ML IJ SOLN
10.0000 mg | Freq: Once | INTRAMUSCULAR | Status: AC
  Administered 2016-12-30: 10 mg via INTRAVENOUS

## 2016-12-30 MED ORDER — BUPIVACAINE-EPINEPHRINE (PF) 0.5% -1:200000 IJ SOLN
INTRAMUSCULAR | Status: DC | PRN
  Administered 2016-12-30: 15 mL via PERINEURAL

## 2016-12-30 SURGICAL SUPPLY — 17 items
CEMENT KYPHON CX01A KIT/MIXER (Cement) ×2 IMPLANT
DECANTER SPIKE VIAL GLASS SM (MISCELLANEOUS) ×6 IMPLANT
DERMABOND ADVANCED (GAUZE/BANDAGES/DRESSINGS) ×1
DERMABOND ADVANCED .7 DNX12 (GAUZE/BANDAGES/DRESSINGS) ×1 IMPLANT
DEVICE BIOPSY BONE KYPHX (INSTRUMENTS) ×2 IMPLANT
DRAPE C-ARM XRAY 36X54 (DRAPES) ×2 IMPLANT
DURAPREP 26ML APPLICATOR (WOUND CARE) ×2 IMPLANT
GLOVE SURG SYN 9.0  PF PI (GLOVE) ×2
GLOVE SURG SYN 9.0 PF PI (GLOVE) ×2 IMPLANT
GOWN SRG 2XL LVL 4 RGLN SLV (GOWNS) ×1 IMPLANT
GOWN STRL NON-REIN 2XL LVL4 (GOWNS) ×1
GOWN STRL REUS W/ TWL LRG LVL3 (GOWN DISPOSABLE) ×1 IMPLANT
GOWN STRL REUS W/TWL LRG LVL3 (GOWN DISPOSABLE) ×1
PACK KYPHOPLASTY (MISCELLANEOUS) ×2 IMPLANT
STRAP SAFETY BODY (MISCELLANEOUS) ×2 IMPLANT
TRAY KYPHOPAK 15/3 EXPRESS 1ST (MISCELLANEOUS) ×2 IMPLANT
TRAY KYPHOPAK 20/3 EXPRESS 1ST (MISCELLANEOUS) IMPLANT

## 2016-12-30 NOTE — Transfer of Care (Signed)
Immediate Anesthesia Transfer of Care Note  Patient: [name]  Procedure(s) Performed: KYPHOPLASTY-T10 (N/A Back)  Patient Location: PACU  Anesthesia Type:General  Level of Consciousness: sedated  Airway & Oxygen Therapy: Patient Spontanous Breathing and Patient connected to nasal cannula oxygen  Post-op Assessment: Report given to RN and Post -op Vital signs reviewed and stable  Post vital signs: Reviewed and stable  Last Vitals:  Vitals:   12/30/16 1610 12/30/16 1620  BP: 119/64 117/62  Pulse: 83 88  Resp: (!) 22 (!) 21  Temp:    SpO2: 91% 91%    Last Pain:  Vitals:   12/30/16 1600  TempSrc:   PainSc: Asleep      Patients Stated Pain Goal: 0 (67/70/34 0352)  Complications: No apparent anesthesia complications

## 2016-12-30 NOTE — OR Nursing (Signed)
Took over care at 1700, moved patient from chair to bed he was short of breath with labored respirations upon moving oxygen sats dropped in the 77% with O2 @ 5L via nasal canal.  Dr. Angie Fava the hospitalitis notified to make sure that patient's admission status and level of care did not need to be increased.  Patient short of breath stating that something did not feel right when he stood up.  Dr. Angie Fava came looked at patient order a stat ABG and listened to patient.  Patient sounded better then before.  Going to send patient to floor on monitor no need to increase level of care at this time.

## 2016-12-30 NOTE — H&P (Signed)
[place] [facility] [place] NAME: [name]    MR#:  [id number]  DATE OF BIRTH:  September 28, 1923  DATE OF ADMISSION:  12/30/2016  PRIMARY CARE PHYSICIAN: Roselee Nova, MD   REQUESTING/REFERRING PHYSICIAN: Dr Kayleen Memos anesthesia  CHIEF COMPLAINT:  Called me because of a syncopal episode in the PACU.  HISTORY OF PRESENT ILLNESS:  [name]  is a 83 y.o. male that had an elective kyphoplasty today by Dr. Rudene Christians.  Postoperatively they were trying to sit him up and he just urinated 300 cc after getting a dose of Lasix.  His eyes rolled back into his head and then he had 10 seconds of some shaking episode no loss of urine or tongue bite after that.  He was hypoxic on 8 L post procedure.  The patient did not want to stay in the hospital.  I convinced him that we need to get him off the oxygen first.  The patient did not want any further testing such as an echocardiogram, carotid ultrasound or CT scan of the head.  The patient states he is not in any back pain at this time.  PAST MEDICAL HISTORY:   Past Medical History:  Diagnosis Date  . Anginal pain ([place])    c/o heaviness a few years ago.  stent placed and all resolved  . Anxiety   . BPH (benign prostatic hypertrophy)   . Carotid stenosis    a. 05/2015 Carotid U/S: mild nonobs atherosclerosis. No need for f/u.  Marland Kitchen Chronic back pain   . Coronary artery disease    a. 06/2013 Cath/PCI: LAD 50-70p (FFR 0.82-->PCI w/ 3.5x24 Promus DES), RCA subtotally occluded w/ L->R collats, LCX 30ost.  . Dyslipidemia   . GERD (gastroesophageal reflux disease)   . Hx of [name]   . Hyperlipidemia   . Hypertension   . Inguinal hernia   . Lightheadedness    a. chronic, somewhat positional.  . 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 Major depression   . Pleural effusion    left  . Sleep apnea    will not use cpap    PAST SURGICAL HISTORY:    Past Surgical History:  Procedure Laterality Date  . APPENDECTOMY     83 years old  . arm fracture Right 2008   rod in arm  . CARDIAC CATHETERIZATION  2002  . CARDIAC CATHETERIZATION  06/2013   stent placed  . COLONOSCOPY    . EYE SURGERY Bilateral 2008   cataract extraction  . FEMUR FRACTURE SURGERY Left    with rod  . HEMORROIDECTOMY     75 years ago  . HIP FRACTURE SURGERY    . JOINT REPLACEMENT Bilateral 03/2016   right partial knee replacement, bilateral THR    SOCIAL HISTORY:   Social History   Tobacco Use  . Smoking status: Former Smoker    Packs/day: 2.00    Years: 7.00    Pack years: 14.00    Types: Cigarettes    Last attempt to quit: 09/17/1953    Years since quitting: 63.3  . Smokeless tobacco: Never Used  Substance Use Topics  . Alcohol use: No    Alcohol/week: 0.0 oz    FAMILY HISTORY:   Family History  Problem Relation Age of Onset  . Heart disease Mother   . Stroke Mother   . Diabetes Father   .  Dementia Father   . Stroke Sister   . Stroke Brother   . Stroke Sister   . Stroke Sister   . Stroke Brother   . Stroke Brother     DRUG ALLERGIES:   Allergies  Allergen Reactions  . Cyclobenzaprine Other (See Comments)    Hallucination. Mental instability.  DO NOT GIVE ANY MUSCLE RELAXANTS  . Baclofen Other (See Comments)    Confusion and weakness  . Nsaids     Avoids due to liver.  . Sulfamethoxazole-Trimethoprim Nausea Only and Other (See Comments)    Patient unaware of this as an allergy  . Tolmetin Other (See Comments)    Avoids due to liver.it is an nsaid    REVIEW OF SYSTEMS:  CONSTITUTIONAL: No fever.  Positive for fatigue.  EYES: Wears glasses.  Right eyelid droop. EARS, NOSE, AND THROAT: No tinnitus or ear pain. No sore throat.  Decreased hearing.  Positive for runny nose. RESPIRATORY: Some cough, no shortness of breath, wheezing or hemoptysis.  CARDIOVASCULAR: No chest pain, orthopnea, edema.  GASTROINTESTINAL: No nausea,  vomiting, diarrhea or abdominal pain. No blood in bowel movements.  Occasional gas in the stomach GENITOURINARY: No dysuria, hematuria.  ENDOCRINE: No polyuria, nocturia,  HEMATOLOGY: No anemia, easy bruising or bleeding SKIN: No rash or lesion. MUSCULOSKELETAL: Positive for joint pain.   NEUROLOGIC: No tingling, numbness, weakness.  PSYCHIATRY: No anxiety or depression.   MEDICATIONS AT HOME:   Prior to Admission medications   Medication Sig Start Date End Date Taking? Authorizing Provider  acetaminophen (TYLENOL) 500 MG tablet Take 2 tablets (1,000 mg total) by mouth every 6 (six) hours. Patient taking differently: Take 1,000 mg every 8 (eight) hours by mouth.  04/17/16  Yes Dustin Flock, MD  bacitracin 500 UNIT/GM ointment Apply 1 application topically 2 (two) times daily. 11/24/16  Yes Roselee Nova, MD  clonazePAM (KLONOPIN) 0.5 MG tablet Take 1 tablet (0.5 mg total) by mouth 3 (three) times daily as needed for anxiety. 11/06/16  Yes Roselee Nova, MD  doxazosin (CARDURA) 1 MG tablet Take 1 tablet (1 mg total) by mouth daily. 11/24/16  Yes Roselee Nova, MD  isosorbide mononitrate (IMDUR) 30 MG 24 hr tablet Take 1 tablet (30 mg total) by mouth daily. 09/07/16  Yes Keith Rake Asad A, MD  lidocaine (LIDODERM) 5 % Place 1 patch onto the skin daily. Remove & Discard patch within 12 hours or as directed by MD 12/09/16 12/09/17 Yes Dustin Flock, MD  Multiple Vitamins-Minerals (PRESERVISION/LUTEIN PO) Take by mouth 2 (two) times daily.   Yes [provider]  pantoprazole (PROTONIX) 40 MG tablet Take 1 tablet (40 mg total) daily by mouth. 12/21/16  Yes Rochel Brome A, MD  polyethylene glycol (MIRALAX / GLYCOLAX) packet Take 17 g by mouth daily.   Yes [provider]  pravastatin (PRAVACHOL) 40 MG tablet Take 1 tablet (40 mg total) by mouth daily. Patient taking differently: Take 40 mg daily by mouth.  11/06/16  Yes Roselee Nova, MD  Saccharomyces boulardii  (PROBIOTIC) 250 MG CAPS Take 1 tablet daily at 6 (six) AM by mouth. 12/21/16  Yes Merlyn Lot, MD  sertraline (ZOLOFT) 100 MG tablet Take 1.5 tablets (150 mg total) by mouth at bedtime. 10/12/16  Yes Roselee Nova, MD  traMADol (ULTRAM) 50 MG tablet Take 25 mg 3 (three) times daily by mouth.  07/10/16  Yes [provider]  metoprolol tartrate (LOPRESSOR) 25 MG tablet  Take 1-2 tablets (25-50 mg total) 2 (two) times daily by mouth. Take 2 tablets (50mg ) in the morning and 1 tablet (25mg ) at bedtime 12/30/16   Roselee Nova, MD  nitroGLYCERIN (NITROSTAT) 0.4 MG SL tablet Place 0.4 mg under the tongue every 5 (five) minutes as needed for chest pain.    [provider]      VITAL SIGNS:  Blood pressure 119/60, pulse 84, temperature (!) 97.1 F (36.2 C), resp. rate (!) 22, height 5\' 10"  (1.778 m), weight 82.1 kg (181 lb), SpO2 (!) 89 %.  PHYSICAL EXAMINATION:  GENERAL:  83 y.o.-year-old patient lying in the bed with no acute distress.  EYES: Pupils equal, round, reactive to light and accommodation. No scleral icterus. Extraocular muscles intact.  HEENT: Head atraumatic, normocephalic. Oropharynx and nasopharynx clear.  NECK:  Supple, no jugular venous distention. No thyroid enlargement, no tenderness.  LUNGS: Decreased breath sounds bilaterally, positive rales halfway up the lung field, no rhonchi or crepitation. No use of accessory muscles of respiration.  CARDIOVASCULAR: S1, S2 normal. No murmurs, rubs, or gallops.  ABDOMEN: Soft, nontender, nondistended. Bowel sounds present. No organomegaly or mass.  EXTREMITIES: Trace edema, no cyanosis, or clubbing.  NEUROLOGIC: Right facial droop.  Muscle strength 5/5 in all extremities. Sensation intact. Gait not checked.  PSYCHIATRIC: The patient is alert and oriented x 3.  SKIN: No rash, lesion, or ulcer.   LABORATORY PANEL:   CBC Recent Labs  Lab 12/29/16 1245  WBC 8.6  HGB 9.8*  HCT 30.0*  PLT 182    ------------------------------------------------------------------------------------------------------------------  Chemistries  Recent Labs  Lab 12/29/16 1245  NA 137  K 4.7  CL 102  CO2 26  GLUCOSE 90  BUN 28*  CREATININE 1.33*  CALCIUM 9.1   ------------------------------------------------------------------------------------------------------------------    RADIOLOGY:  X-ray Chest Pa Or Ap  Result Date: 12/30/2016 CLINICAL DATA:  Hypoxia post kyphoplasty. EXAM: CHEST 1 VIEW COMPARISON:  04/15/2016 and CT 02/19/2016 FINDINGS: Examination demonstrates no change in opacification over the lower 1/3 to 1/2 of the left hemithorax compatible with known loculated effusion and atelectasis. There is new hazy prominence of the interstitial markings likely interstitial edema. Cardiomediastinal silhouette and remainder of the exam is unchanged. IMPRESSION: New moderate hazy interstitial prominence of the perihilar markings likely interstitial edema. Stable chronic opacification of the lower 1/2 to 1/3 of the left hemithorax compatible with known loculated effusion and atelectasis. Electronically Signed   By: Marin Olp M.D.   On: 12/30/2016 14:31   Dg Thoracic Spine 2 View  Result Date: 12/30/2016 CLINICAL DATA:  T10 vertebral fracture EXAM: DG C-ARM 61-120 MIN; THORACIC SPINE 2 VIEWS COMPARISON:  None. FLUOROSCOPY TIME:  Fluoroscopy Time:  1 minutes 39 seconds Radiation Exposure Index (if provided by the fluoroscopic device): 22.3 mGy Number of Acquired Spot Images: 2 FINDINGS: Contrast laden cement is noted along the superior aspect of T10 within the known fracture line. No significant extravasation of cement is noted. IMPRESSION: T10 kyphoplasty Electronically Signed   By: Inez Catalina M.D.   On: 12/30/2016 13:09   Dg C-arm 1-60 Min  Result Date: 12/30/2016 CLINICAL DATA:  T10 vertebral fracture EXAM: DG C-ARM 61-120 MIN; THORACIC SPINE 2 VIEWS COMPARISON:  None. FLUOROSCOPY TIME:   Fluoroscopy Time:  1 minutes 39 seconds Radiation Exposure Index (if provided by the fluoroscopic device): 22.3 mGy Number of Acquired Spot Images: 2 FINDINGS: Contrast laden cement is noted along the superior aspect of T10 within the known fracture line. No significant  extravasation of cement is noted. IMPRESSION: T10 kyphoplasty Electronically Signed   By: Inez Catalina M.D.   On: 12/30/2016 13:09    EKG:   Ordered by me  IMPRESSION AND PLAN:   1.  Acute hypoxic respiratory failure with pulse ox in the 80s.  Likely a combination of fluid overload from IV fluids in the OR, anesthesia and not taking a deep breath.  Will give 40 mg IV Lasix.  Stop IV fluids.  Oxygen supplementation. 2.  Syncope postoperatively and after urination.  I do not think this was seizure.  Patient refused any further testing except for telemetry monitoring. 3.  Status post kyphoplasty physical therapy evaluation tomorrow 4.  Chronic kidney disease stage III check again tomorrow morning 5.  Chronic left pleural effusion 6.  Essential hypertension on metoprolol 7.  History of [name] 8.  Hyperlipidemia unspecified on pravastatin  All the records are reviewed and case discussed with ED provider. Management plans discussed with the patient, family and they are in agreement.  CODE STATUS: DNR  TOTAL TIME TAKING CARE OF THIS PATIENT: 50 minutes.    Loletha Grayer M.D on 12/30/2016 at 4:09 PM  Between 7am to 6pm - Pager - [phone]  After 6pm call admission pager [phone]  [facility]  [phone]  CC: Primary care physician; Roselee Nova, MD

## 2016-12-30 NOTE — Anesthesia Procedure Notes (Signed)
Date/Time: 12/30/2016 12:05 PM Performed by: Nelda Marseille, CRNA Pre-anesthesia Checklist: Patient identified, Emergency Drugs available, Suction available, Patient being monitored and Timeout performed Oxygen Delivery Method: Nasal cannula

## 2016-12-30 NOTE — Addendum Note (Signed)
Addended by: Bud Face N on: 12/30/2016 09:09 AM   Modules accepted: Orders

## 2016-12-30 NOTE — Anesthesia Post-op Follow-up Note (Signed)
Anesthesia QCDR form completed.        

## 2016-12-30 NOTE — Progress Notes (Signed)
Patient ID: [name], male   DOB: Jul 14, 1923, 83 y.o.   MRN: [id number]  Called back too see patient with hypoxia.  I dialed the patients oxygen to 3 liters and got blood gas.  Patient is stil hypoxic.  Lungs now with better air entry after nebulizers and lasix  I think the patient can go to the floor Will repeat cxr tomorrow Give another dose of IV lasix tomorrow am Incentive spirometry  Dr Leslye Peer

## 2016-12-30 NOTE — Anesthesia Postprocedure Evaluation (Signed)
Anesthesia Post Note  Patient: [name]  Procedure(s) Performed: KYPHOPLASTY-T10 (N/A Back)  Patient location during evaluation: PACU Anesthesia Type: General Level of consciousness: awake and alert Pain management: pain level controlled Vital Signs Assessment: post-procedure vital signs reviewed and stable Respiratory status: spontaneous breathing, nonlabored ventilation and patient connected to nasal cannula oxygen Cardiovascular status: blood pressure returned to baseline and stable Postop Assessment: no apparent nausea or vomiting Comments: Hypoxia and syncopal episode in the PACU.  Concern for acute pulmonary edema even though patient only received 400 ml of fluid during the case.  Medicine consulted with plan to admit patient overnight for treatment and observation.     Last Vitals:  Vitals:   12/30/16 1610 12/30/16 1620  BP: 119/64 117/62  Pulse: 83 88  Resp: (!) 22 (!) 21  Temp:    SpO2: 91% 91%    Last Pain:  Vitals:   12/30/16 1600  TempSrc:   PainSc: Asleep                 Precious Haws Piscitello

## 2016-12-30 NOTE — Op Note (Signed)
12/30/2016  1:03 PM  PATIENT:  [name]  83 y.o. male  PRE-OPERATIVE DIAGNOSIS:  closed wedge compression fracture of tenth thoracic vertebra  POST-OPERATIVE DIAGNOSIS:  closed wedge compression fracture of tenth thoracic vertebra  PROCEDURE:  Procedure(s): KYPHOPLASTY-T10 (N/A)  SURGEON: Laurene Footman, MD  ASSISTANTS: None  ANESTHESIA:   local and MAC  EBL:  No intake/output data recorded.  BLOOD ADMINISTERED:none  DRAINS: none   LOCAL MEDICATIONS USED:  MARCAINE    and XYLOCAINE   SPECIMEN:  Source of Specimen:  T 10 vertebral body  DISPOSITION OF SPECIMEN:  PATHOLOGY  COUNTS:  YES  TOURNIQUET:  * No tourniquets in log *  IMPLANTS: Bone cement  DICTATION: .Dragon Dictation  Patient brought the operating room and after adequate sedation was obtained the patient was placed prone and C-arm brought in with good visualization of T10on the C-arm in both AP and lateral projections. After appropriate patient identification and timeout procedure local anesthetic was infiltratedon the rightat T10. The back was then prepped and draped in sterile fashion and repeat timeout procedure carried out. Spinal needle was used to get local asthenic down to the pedicle on theright at T10. Small incision was then made and trocar advanced into the vertebral body and an extra pedicular fashion taking frequent C-arm views to make sure the neural foramen and spinal canal were not entered. Specimen was obtained during biopsy part of the procedure followed by drilling carried out followed by placement of balloon inflation ofT 11 balloon to 2 cc . Next the cement was mixed and was appropriate consistency it was used to fill the vertebral bodies with about3.5cc into T10with good fill and interdigitation. After the cement was set the trochar wasremoved and permanent C-arm views showed adequate position of the cement with good fill superior to inferior endplates medial and lateral. The  wound wasclosed with Dermabond followed by Band aid     PLAN OF CARE: Discharge to home after PACU  PATIENT DISPOSITION:  PACU - hemodynamically stable.

## 2016-12-30 NOTE — Anesthesia Preprocedure Evaluation (Signed)
Anesthesia Evaluation  Patient identified by MRN, date of birth, ID band Patient awake    Reviewed: Allergy & Precautions, H&P , NPO status , Patient's Chart, lab work & pertinent test results  History of Anesthesia Complications Negative for: history of anesthetic complications  Airway Mallampati: III  TM Distance: <3 FB Neck ROM: limited    Dental  (+) Chipped, Poor Dentition, Missing   Pulmonary neg shortness of breath, sleep apnea , former smoker,           Cardiovascular hypertension, (-) angina+ [name], + Past MI, + Cardiac Stents and + Peripheral Vascular Disease       Neuro/Psych PSYCHIATRIC DISORDERS Anxiety Depression negative neurological ROS     GI/Hepatic negative GI ROS, Neg liver ROS, GERD  ,  Endo/Other  negative endocrine ROS  Renal/GU Renal disease  negative genitourinary   Musculoskeletal   Abdominal   Peds  Hematology negative hematology ROS (+)   Anesthesia Other Findings Past Medical History: No date: Anginal pain ([facility])     Comment:  c/o heaviness a few years ago.  stent placed and all               resolved No date: Anxiety No date: BPH (benign prostatic hypertrophy) No date: Carotid stenosis     Comment:  a. 05/2015 Carotid U/S: mild nonobs atherosclerosis. No               need for f/u. No date: Chronic back pain No date: Coronary artery disease     Comment:  a. 06/2013 Cath/PCI: LAD 50-70p (FFR 0.82-->PCI w/ 3.5x24              Promus DES), RCA subtotally occluded w/ L->R collats, LCX              30ost. No date: Dyslipidemia No date: GERD (gastroesophageal reflux disease) No date: Hx of [name] No date: Hyperlipidemia No date: Hypertension No date: Inguinal hernia No date: Lightheadedness     Comment:  a. chronic, somewhat positional. 1998: Lymphoblastic lymphoma ([place])     Comment:  turned out to be something else in same family but not a              problem No date: Major  depression No date: Pleural effusion     Comment:  left No date: Sleep apnea     Comment:  will not use cpap  Past Surgical History: No date: APPENDECTOMY     Comment:  83 years old 2008: arm fracture; Right     Comment:  rod in arm 2002: CARDIAC CATHETERIZATION 06/2013: CARDIAC CATHETERIZATION     Comment:  stent placed No date: COLONOSCOPY 2008: EYE SURGERY; Bilateral     Comment:  cataract extraction No date: FEMUR FRACTURE SURGERY; Left     Comment:  with rod No date: HEMORROIDECTOMY     Comment:  75 years ago No date: HIP FRACTURE SURGERY 03/2016: JOINT REPLACEMENT; Bilateral     Comment:  right partial knee replacement, bilateral THR  BMI    Body Mass Index:  25.97 kg/m      Reproductive/Obstetrics negative OB ROS                             Anesthesia Physical Anesthesia Plan  ASA: III  Anesthesia Plan: General   Post-op Pain Management:    Induction: Intravenous  PONV Risk Score and Plan: Propofol infusion  Airway Management  Planned: Natural Airway and Nasal Cannula  Additional Equipment:   Intra-op Plan:   Post-operative Plan:   Informed Consent: I have reviewed the patients History and Physical, chart, labs and discussed the procedure including the risks, benefits and alternatives for the proposed anesthesia with the patient or authorized representative who has indicated his/her understanding and acceptance.   Dental Advisory Given  Plan Discussed with: Anesthesiologist, CRNA and Surgeon  Anesthesia Plan Comments: (Patient consented for risks of anesthesia including but not limited to:  - adverse reactions to medications - risk of intubation if required - damage to teeth, lips or other oral mucosa - sore throat or hoarseness - Damage to heart, brain, lungs or loss of life  Patient voiced understanding.)        Anesthesia Quick Evaluation

## 2016-12-30 NOTE — H&P (Signed)
Reviewed paper H+P, will be scanned into chart. No changes noted.  

## 2016-12-30 NOTE — Discharge Instructions (Addendum)
Take it easy today and try to lay flat on your back tomorrow to more normal activity around the house and on Friday resume normal activities as tolerated by pain. Remove Band-Aid on Friday shower after that.  AMBULATORY SURGERY  DISCHARGE INSTRUCTIONS   1) The drugs that you were given will stay in your system until tomorrow so for the next 24 hours you should not:  A) Drive an automobile B) Make any legal decisions C) Drink any alcoholic beverage   2) You may resume regular meals tomorrow.  Today it is better to start with liquids and gradually work up to solid foods.  You may eat anything you prefer, but it is better to start with liquids, then soup and crackers, and gradually work up to solid foods.   3) Please notify your doctor immediately if you have any unusual bleeding, trouble breathing, redness and pain at the surgery site, drainage, fever, or pain not relieved by medication.    4) Additional Instructions:        Please contact your physician with any problems or Same Day Surgery at [phone], Monday through Friday 6 am to 4 pm, or [place] at [facility] number at [phone].

## 2016-12-30 NOTE — Telephone Encounter (Signed)
Copied from [place]. Topic: Inquiry >> Dec 30, 2016  8:34 AM Malena Catholic I, NT wrote: Reason for CRM:pt wife call for Rx Refill for Metoprolol his used [facility] @[address] in [place]

## 2016-12-31 ENCOUNTER — Encounter: Payer: Self-pay | Admitting: Orthopedic Surgery

## 2016-12-31 ENCOUNTER — Observation Stay: Payer: Medicare Other

## 2016-12-31 DIAGNOSIS — Y95 Nosocomial condition: Secondary | ICD-10-CM | POA: Diagnosis present

## 2016-12-31 DIAGNOSIS — I2510 Atherosclerotic heart disease of native coronary artery without angina pectoris: Secondary | ICD-10-CM | POA: Diagnosis present

## 2016-12-31 DIAGNOSIS — Z886 Allergy status to analgesic agent status: Secondary | ICD-10-CM | POA: Diagnosis not present

## 2016-12-31 DIAGNOSIS — N400 Enlarged prostate without lower urinary tract symptoms: Secondary | ICD-10-CM | POA: Diagnosis present

## 2016-12-31 DIAGNOSIS — S22070A Wedge compression fracture of T9-T10 vertebra, initial encounter for closed fracture: Secondary | ICD-10-CM | POA: Diagnosis present

## 2016-12-31 DIAGNOSIS — J95821 Acute postprocedural respiratory failure: Secondary | ICD-10-CM | POA: Diagnosis present

## 2016-12-31 DIAGNOSIS — Z87891 Personal history of nicotine dependence: Secondary | ICD-10-CM | POA: Diagnosis not present

## 2016-12-31 DIAGNOSIS — R55 Syncope and collapse: Secondary | ICD-10-CM | POA: Diagnosis present

## 2016-12-31 DIAGNOSIS — F419 Anxiety disorder, unspecified: Secondary | ICD-10-CM | POA: Diagnosis present

## 2016-12-31 DIAGNOSIS — E785 Hyperlipidemia, unspecified: Secondary | ICD-10-CM | POA: Diagnosis present

## 2016-12-31 DIAGNOSIS — J9811 Atelectasis: Secondary | ICD-10-CM | POA: Diagnosis not present

## 2016-12-31 DIAGNOSIS — J189 Pneumonia, unspecified organism: Secondary | ICD-10-CM | POA: Diagnosis not present

## 2016-12-31 DIAGNOSIS — R042 Hemoptysis: Secondary | ICD-10-CM | POA: Diagnosis not present

## 2016-12-31 DIAGNOSIS — J90 Pleural effusion, not elsewhere classified: Secondary | ICD-10-CM | POA: Diagnosis present

## 2016-12-31 DIAGNOSIS — N183 Chronic kidney disease, stage 3 (moderate): Secondary | ICD-10-CM | POA: Diagnosis present

## 2016-12-31 DIAGNOSIS — E8770 Fluid overload, unspecified: Secondary | ICD-10-CM | POA: Diagnosis present

## 2016-12-31 DIAGNOSIS — K219 Gastro-esophageal reflux disease without esophagitis: Secondary | ICD-10-CM | POA: Diagnosis present

## 2016-12-31 DIAGNOSIS — F329 Major depressive disorder, single episode, unspecified: Secondary | ICD-10-CM | POA: Diagnosis present

## 2016-12-31 DIAGNOSIS — G510 Bell's palsy: Secondary | ICD-10-CM | POA: Diagnosis present

## 2016-12-31 DIAGNOSIS — G8929 Other chronic pain: Secondary | ICD-10-CM | POA: Diagnosis present

## 2016-12-31 DIAGNOSIS — Z955 Presence of coronary angioplasty implant and graft: Secondary | ICD-10-CM | POA: Diagnosis not present

## 2016-12-31 DIAGNOSIS — I129 Hypertensive chronic kidney disease with stage 1 through stage 4 chronic kidney disease, or unspecified chronic kidney disease: Secondary | ICD-10-CM | POA: Diagnosis present

## 2016-12-31 DIAGNOSIS — G4730 Sleep apnea, unspecified: Secondary | ICD-10-CM | POA: Diagnosis present

## 2016-12-31 DIAGNOSIS — Z8249 Family history of ischemic heart disease and other diseases of the circulatory system: Secondary | ICD-10-CM | POA: Diagnosis not present

## 2016-12-31 DIAGNOSIS — Z96653 Presence of artificial knee joint, bilateral: Secondary | ICD-10-CM | POA: Diagnosis present

## 2016-12-31 LAB — CBC
HCT: 30.6 % — ABNORMAL LOW (ref 40.0–52.0)
HCT: 32.8 % — ABNORMAL LOW (ref 40.0–52.0)
Hemoglobin: 9.9 g/dL — ABNORMAL LOW (ref 13.0–18.0)
Hemoglobin: 10.5 g/dL — ABNORMAL LOW (ref 13.0–18.0)
MCH: 27.6 pg (ref 26.0–34.0)
MCH: 27.7 pg (ref 26.0–34.0)
MCHC: 32.3 g/dL (ref 32.0–36.0)
MCHC: 31.9 g/dL — ABNORMAL LOW (ref 32.0–36.0)
MCV: 85.5 fL (ref 80.0–100.0)
MCV: 86.8 fL (ref 80.0–100.0)
Platelets: 169 10*3/uL (ref 150–440)
Platelets: 165 10*3/uL (ref 150–440)
RBC: 3.58 MIL/uL — ABNORMAL LOW (ref 4.40–5.90)
RBC: 3.78 MIL/uL — ABNORMAL LOW (ref 4.40–5.90)
RDW: 16.5 % — ABNORMAL HIGH (ref 11.5–14.5)
RDW: 16.8 % — ABNORMAL HIGH (ref 11.5–14.5)
WBC: 18.1 10*3/uL — ABNORMAL HIGH (ref 3.8–10.6)
WBC: 16.9 10*3/uL — ABNORMAL HIGH (ref 3.8–10.6)

## 2016-12-31 LAB — BASIC METABOLIC PANEL
Anion gap: 13 (ref 5–15)
BUN: 30 mg/dL — ABNORMAL HIGH (ref 6–20)
CO2: 23 mmol/L (ref 22–32)
Calcium: 8.9 mg/dL (ref 8.9–10.3)
Chloride: 101 mmol/L (ref 101–111)
Creatinine, Ser: 1.53 mg/dL — ABNORMAL HIGH (ref 0.61–1.24)
GFR calc Af Amer: 44 mL/min — ABNORMAL LOW (ref >60)
GFR calc non Af Amer: 38 mL/min — ABNORMAL LOW (ref >60)
Glucose, Bld: 126 mg/dL — ABNORMAL HIGH (ref 65–99)
Potassium: 4.1 mmol/L (ref 3.5–5.1)
Sodium: 137 mmol/L (ref 135–145)

## 2016-12-31 LAB — SURGICAL PATHOLOGY

## 2016-12-31 MED ORDER — ALUM & MAG HYDROXIDE-SIMETH 200-200-20 MG/5ML PO SUSP
15.0000 mL | ORAL | Status: DC | PRN
  Administered 2016-12-31: 15 mL via ORAL
  Filled 2016-12-31: qty 30

## 2016-12-31 MED ORDER — AZITHROMYCIN 250 MG PO TABS
250.0000 mg | ORAL_TABLET | Freq: Every day | ORAL | Status: DC
  Administered 2017-01-01: 250 mg via ORAL
  Filled 2016-12-31: qty 1

## 2016-12-31 MED ORDER — AZITHROMYCIN 250 MG PO TABS
500.0000 mg | ORAL_TABLET | Freq: Every day | ORAL | Status: AC
Start: 2016-12-31 — End: 2016-12-31
  Administered 2016-12-31: 500 mg via ORAL
  Filled 2016-12-31: qty 2

## 2016-12-31 MED ORDER — PANTOPRAZOLE SODIUM 40 MG IV SOLR
40.0000 mg | Freq: Two times a day (BID) | INTRAVENOUS | Status: DC
  Administered 2016-12-31 – 2017-01-01 (×3): 40 mg via INTRAVENOUS
  Filled 2016-12-31 (×3): qty 40

## 2016-12-31 NOTE — Evaluation (Signed)
Physical Therapy Evaluation Patient Details Name: [name] MRN: [id number] DOB: 1924/01/08 Today's Date: 12/31/2016   History of Present Illness  Pt is a 83 y/o M s/p elective kyphoplasty who postoperatively had an episode where his eyes rolled back and demonstrated a shaking episode.  The pt was hypoxic on 8L post procedure.  The pt did not want any further testing such as an echocardiogram, carotid ultrasound or CT scan of the head.  Pt admitted following procedure due to acute respiratory failure with hypoxia.  Pt's PMH includes anginal pain, chronic back pain, bell's palsy, lymphoblastic lymphoma, major depression, L femur fracture surgery, R partial knee replacement, Bil THA.      Clinical Impression  Patient is s/p above surgery resulting in functional limitations due to the deficits listed below (see PT Problem List). [name] is from an ILF where he lives with his wife. At baseline he ambulates with a rollator and is independent with ADLs.  He currently requires min assist for sit>stand and min assist when initiating ambulation for first time OOB.  However, as pt continues to ambulate he only requires min guard assist with a slower but steady gait. Patient will benefit from skilled PT to increase their independence and safety with mobility to allow discharge to the venue listed below.      Follow Up Recommendations Home health PT;Supervision/Assistance - 24 hour    Equipment Recommendations  None recommended by PT    Recommendations for Other Services       Precautions / Restrictions Precautions Precautions: Fall;Back;Other (comment) Precaution Booklet Issued: No Precaution Comments: Pt instructed in back precautions.  O2 needs to be monitored throughout session.  Restrictions Weight Bearing Restrictions: No      Mobility  Bed Mobility Overal bed mobility: Needs Assistance Bed Mobility: Supine to Sit     Supine to sit: Min assist;HOB elevated     General bed  mobility comments: Cues for log roll technique and assist to elevate trunk from sidelying.   Transfers Overall transfer level: Needs assistance Equipment used: Rolling walker (2 wheeled) Transfers: Sit to/from Stand Sit to Stand: Min assist         General transfer comment: Min assist due to unsteadiness.  Cues for proper hand placement and safe technique.  Pt with poorly controlled descent to sit.    Ambulation/Gait Ambulation/Gait assistance: Min guard;Min assist Ambulation Distance (Feet): 24 Feet Assistive device: Rolling walker (2 wheeled) Gait Pattern/deviations: Step-through pattern;Decreased stride length;Trunk flexed Gait velocity: decreased   General Gait Details: Pt with flexed posture which does not improve with cues (pt's baseline).  Pt initially unsteady with first several steps requiring min assist.  However, as he continues to ambulate only min guard assist required as pt slow but steady ambulating in room.   Stairs            Wheelchair Mobility    Modified Rankin (Stroke Patients Only)       Balance Overall balance assessment: Needs assistance Sitting-balance support: No upper extremity supported;Feet supported Sitting balance-Leahy Scale: Good     Standing balance support: Bilateral upper extremity supported;During functional activity Standing balance-Leahy Scale: Poor Standing balance comment: Relies on RW for support with static and dynamic activities                             Pertinent Vitals/Pain Pain Assessment: Faces Faces Pain Scale: Hurts even more Pain Location: back Pain Descriptors / Indicators:  Aching Pain Intervention(s): Limited activity within patient's tolerance;Monitored during session;Repositioned    Home Living Family/patient expects to be discharged to:: Private residence Living Arrangements: Spouse/significant other Available Help at Discharge: Family;Available 24 hours/day Type of Home: Independent living  facility Home Access: Level entry     Home Layout: One level Home Equipment: Walker - 4 wheels(has a borrowed WC)      Prior Function Level of Independence: Independent with assistive device(s)         Comments: Pt ambulates with rollator and reports 3 falls in the past 6 months.  He is independent with ADLs.      Hand Dominance        Extremity/Trunk Assessment   Upper Extremity Assessment Upper Extremity Assessment: (BUE strength grossly 4/5)    Lower Extremity Assessment Lower Extremity Assessment: (BLE strength grossly 4-/5)    Cervical / Trunk Assessment Cervical / Trunk Assessment: Other exceptions;Kyphotic Cervical / Trunk Exceptions: s/p kyphoplasty  Communication   Communication: HOH  Cognition Arousal/Alertness: Awake/alert Behavior During Therapy: WFL for tasks assessed/performed Overall Cognitive Status: Within Functional Limits for tasks assessed                                        General Comments General comments (skin integrity, edema, etc.): SpO2 remains at or above 92% on 4L O2 throughout session.     Exercises General Exercises - Lower Extremity Ankle Circles/Pumps: AROM;Both;10 reps;Supine   Assessment/Plan    PT Assessment Patient needs continued PT services  PT Problem List Decreased strength;Decreased activity tolerance;Decreased balance;Decreased mobility;Decreased knowledge of use of DME;Decreased safety awareness;Decreased knowledge of precautions;Pain;Cardiopulmonary status limiting activity       PT Treatment Interventions DME instruction;Gait training;Functional mobility training;Therapeutic activities;Therapeutic exercise;Balance training;Neuromuscular re-education;Patient/family education;Wheelchair mobility training;Modalities    PT Goals (Current goals can be found in the Care Plan section)  Acute Rehab PT Goals Patient Stated Goal: to go home PT Goal Formulation: With patient Time For Goal Achievement:  01/14/17 Potential to Achieve Goals: Good    Frequency Min 2X/week   Barriers to discharge        Co-evaluation               AM-PAC PT "6 Clicks" Daily Activity  Outcome Measure Difficulty turning over in bed (including adjusting bedclothes, sheets and blankets)?: Unable Difficulty moving from lying on back to sitting on the side of the bed? : Unable Difficulty sitting down on and standing up from a chair with arms (e.g., wheelchair, bedside commode, etc,.)?: Unable Help needed moving to and from a bed to chair (including a wheelchair)?: A Little Help needed walking in hospital room?: A Little Help needed climbing 3-5 steps with a railing? : A Lot 6 Click Score: 11    End of Session Equipment Utilized During Treatment: Gait belt;Oxygen Activity Tolerance: Patient tolerated treatment well;Patient limited by fatigue Patient left: in chair;with call bell/phone within reach;with chair alarm set;with family/visitor present Nurse Communication: Mobility status;Other (comment)(SpO2) PT Visit Diagnosis: Pain;Unsteadiness on feet (R26.81);Muscle weakness (generalized) (M62.81);History of falling (Z91.81) Pain - Right/Left: (middle) Pain - part of body: (Back)    Time: 9937-1696 PT Time Calculation (min) (ACUTE ONLY): 26 min   Charges:   PT Evaluation $PT Eval Moderate Complexity: 1 Mod PT Treatments $Gait Training: 8-22 mins   PT G Codes:   PT G-Codes **NOT FOR INPATIENT CLASS** Functional Assessment Tool Used: AM-PAC 6  Clicks Basic Mobility;Clinical judgement Functional Limitation: Mobility: Walking and moving around Mobility: Walking and Moving Around Current Status [phone]): At least 60 percent but less than 80 percent impaired, limited or restricted Mobility: Walking and Moving Around Goal Status [phone]): At least 20 percent but less than 40 percent impaired, limited or restricted    Collie Siad PT, DPT 12/31/2016, 2:36 PM

## 2016-12-31 NOTE — Care Management (Signed)
Placed in observation s/p kyphoplasty after syncopal episode and hypoxia.  He is currently followed by [facility] and PT.  PT consult is pending.  He does have chronic oxygen at home.  Discussed need to wean and discontinue oxygen during progression

## 2016-12-31 NOTE — Progress Notes (Signed)
[facility] NAME: [name]    MR#:  [id number]  DATE OF BIRTH:  June 05, 1923  SUBJECTIVE: Admitted to medical service yesterday because of acute respiratory failure with hypoxia after kyphoplasty.  Patient states he feels little better but has been having a cough and small amount of blood coming with cough.  Complains of back pain, 02 saturation 92% on 4 L when he worked with physical therapy.  CHIEF COMPLAINT:  No chief complaint on file.   REVIEW OF SYSTEMS:    Review of Systems  Constitutional: Negative for chills and fever.  HENT: Negative for hearing loss.   Eyes: Negative for blurred vision, double vision and photophobia.  Respiratory: Negative for cough, hemoptysis and shortness of breath.   Cardiovascular: Negative for palpitations, orthopnea and leg swelling.  Gastrointestinal: Negative for abdominal pain, diarrhea and vomiting.  Genitourinary: Negative for dysuria and urgency.  Musculoskeletal: Positive for back pain. Negative for myalgias and neck pain.  Skin: Negative for rash.  Neurological: Negative for dizziness, focal weakness, seizures, weakness and headaches.  Psychiatric/Behavioral: Negative for memory loss. The patient does not have insomnia.     Nutrition: Tolerating Diet: Tolerating PT:      DRUG ALLERGIES:   Allergies  Allergen Reactions  . Cyclobenzaprine Other (See Comments)    Hallucination. Mental instability.  DO NOT GIVE ANY MUSCLE RELAXANTS  . Baclofen Other (See Comments)    Confusion and weakness  . Nsaids     Avoids due to liver.  . Sulfamethoxazole-Trimethoprim Nausea Only and Other (See Comments)    Patient unaware of this as an allergy  . Tolmetin Other (See Comments)    Avoids due to liver.it is an nsaid    VITALS:  Blood pressure (!) 112/45, pulse 89, temperature 97.9 F (36.6 C), temperature source Oral, resp. rate 18, height 5\' 10"  (1.778 m), weight 78.4 kg (172 lb 14.4  oz), SpO2 94 %.  PHYSICAL EXAMINATION:   Physical Exam  GENERAL:  83 y.o.-year-old patient lying in the bed with no acute distress.  EYES: Pupils equal, round, reactive to light . No scleral icterus. Extraocular muscles intact.  HEENT: Head atraumatic, normocephalic. Oropharynx and nasopharynx clear.  NECK:  Supple, no jugular venous distention. No thyroid enlargement, no tenderness.  LUNGS: Normal breath sounds bilaterally, no wheezing, rales,rhonchi or crepitation. No use of accessory muscles of respiration.  CARDIOVASCULAR: S1, S2 normal. No murmurs, rubs, or gallops.  ABDOMEN: Soft, nontender, nondistended. Bowel sounds present. No organomegaly or mass.  EXTREMITIES: No pedal edema, cyanosis, or clubbing.  NEUROLOGIC: Cranial nerves II through XII are intact. Muscle strength 5/5 in all extremities. Sensation intact. Gait not checked.  PSYCHIATRIC: The patient is alert and oriented x 3.  SKIN: No obvious rash, lesion, or ulcer.    LABORATORY PANEL:   CBC Recent Labs  Lab 12/31/16 0939  WBC 16.9*  HGB 10.5*  HCT 32.8*  PLT 165   ------------------------------------------------------------------------------------------------------------------  Chemistries  Recent Labs  Lab 12/31/16 0609  NA 137  K 4.1  CL 101  CO2 23  GLUCOSE 126*  BUN 30*  CREATININE 1.53*  CALCIUM 8.9   ------------------------------------------------------------------------------------------------------------------  Cardiac Enzymes No results for input(s): TROPONINI in the last 168 hours. ------------------------------------------------------------------------------------------------------------------  RADIOLOGY:  X-ray Chest Pa Or Ap  Result Date: 12/30/2016 CLINICAL DATA:  Hypoxia post kyphoplasty. EXAM: CHEST 1 VIEW COMPARISON:  04/15/2016 and CT 02/19/2016 FINDINGS: Examination demonstrates no change in opacification over the lower 1/3  to 1/2 of the left hemithorax compatible with known  loculated effusion and atelectasis. There is new hazy prominence of the interstitial markings likely interstitial edema. Cardiomediastinal silhouette and remainder of the exam is unchanged. IMPRESSION: New moderate hazy interstitial prominence of the perihilar markings likely interstitial edema. Stable chronic opacification of the lower 1/2 to 1/3 of the left hemithorax compatible with known loculated effusion and atelectasis. Electronically Signed   By: Marin Olp M.D.   On: 12/30/2016 14:31   Dg Thoracic Spine 2 View  Result Date: 12/30/2016 CLINICAL DATA:  T10 vertebral fracture EXAM: DG C-ARM 61-120 MIN; THORACIC SPINE 2 VIEWS COMPARISON:  None. FLUOROSCOPY TIME:  Fluoroscopy Time:  1 minutes 39 seconds Radiation Exposure Index (if provided by the fluoroscopic device): 22.3 mGy Number of Acquired Spot Images: 2 FINDINGS: Contrast laden cement is noted along the superior aspect of T10 within the known fracture line. No significant extravasation of cement is noted. IMPRESSION: T10 kyphoplasty Electronically Signed   By: Inez Catalina M.D.   On: 12/30/2016 13:09   Dg Chest Port 1 View  Result Date: 12/31/2016 CLINICAL DATA:  Hypoxia EXAM: PORTABLE CHEST 1 VIEW COMPARISON:  Yesterday FINDINGS: Pleural effusion at the left base that was loculated based on 02/19/2016 chest CT. Interstitial coarsening is borderline improved from yesterday, likely edema. Non obscured heart size and contours is stable. No pneumothorax. IMPRESSION: 1. Borderline improved interstitial opacity that is likely from edema. 2. Chronic loculated left pleural effusion. Electronically Signed   By: Monte Fantasia M.D.   On: 12/31/2016 07:04   Dg C-arm 1-60 Min  Result Date: 12/30/2016 CLINICAL DATA:  T10 vertebral fracture EXAM: DG C-ARM 61-120 MIN; THORACIC SPINE 2 VIEWS COMPARISON:  None. FLUOROSCOPY TIME:  Fluoroscopy Time:  1 minutes 39 seconds Radiation Exposure Index (if provided by the fluoroscopic device): 22.3 mGy Number  of Acquired Spot Images: 2 FINDINGS: Contrast laden cement is noted along the superior aspect of T10 within the known fracture line. No significant extravasation of cement is noted. IMPRESSION: T10 kyphoplasty Electronically Signed   By: Inez Catalina M.D.   On: 12/30/2016 13:09     ASSESSMENT AND PLAN:   Active Problems:   Acute respiratory failure (HCC)  1 syncope likely secondary to acute respiratory failure with hypoxia: No arrhythmias on monitor. Acute respiratory failure with hypoxia likely secondary to fluid overload that he received for surgery and also due to anesthesia: Improving, c continue IV Lasix for another 24 hours and see if hypoxia resolves because patient still needing 4 L of oxygen and saturation is 92%. 3.  Mild hemoptysis: Stop with Lovenox, hemoglobin stable, may have acute bronchitis as well: Continue nebulizers, oxygen, and antibiotics empirically because of elevated white count, wheezing, mild hemoptysis. 4.  Recent kyphoplasty yesterday, continue physical therapy, add incentive spirometry. 5.  Hopefully discharge home tomorrow if he is able to come off the oxygen. discussed with patient, wife, case Freight forwarder.   All the records are reviewed and case discussed with Care Management/Social Workerr. Management plans discussed with the patient, family and they are in agreement.  CODE STATUS: DNR TOTAL TIME TAKING CARE OF THIS PATIENT: 35 minutes.   POSSIBLE D/C IN 1-2DAYS, DEPENDING ON CLINICAL CONDITION.   Epifanio Lesches M.D on 12/31/2016 at 12:42 PM  Between 7am to 6pm - Pager - [phone]  After 6pm go to [email or web address] - password [email or web address] [facility]  [facility]  Office  [phone]  CC: Primary care physician; Roselee Nova, MD

## 2016-12-31 NOTE — Progress Notes (Signed)
Pt. Found to be coughing up some thick frank blood this morning, he said he has had this happen last night as well. Notified Dr. Vianne Bulls, of pt status at this time, CBC ordered, and IV protonix ordered at this time, and given to patient, VSS, Alert and oriented, will continue to monitor closely.

## 2016-12-31 NOTE — Progress Notes (Signed)
   Subjective: 1 Day Post-Op Procedure(s) (LRB): KYPHOPLASTY-T10 (N/A) Patient reports back pain has improved. SOB yesterday improving. Patient is well, and has had no acute complaints or problems We will start therapy today.  .  Objective: Vital signs in last 24 hours: Temp:  [97.1 F (36.2 C)-98.8 F (37.1 C)] 98.7 F (37.1 C) (11/15 0354) Pulse Rate:  [58-96] 84 (11/15 0354) Resp:  [12-31] 18 (11/15 0354) BP: (90-131)/(51-76) 110/53 (11/15 0354) SpO2:  [75 %-99 %] 92 % (11/15 0721) Weight:  [78.4 kg (172 lb 14.4 oz)-82.1 kg (181 lb)] 78.4 kg (172 lb 14.4 oz) (11/14 1950)  Intake/Output from previous day: 11/14 0701 - 11/15 0700 In: 640 [P.O.:240; I.V.:400] Out: 1450 [Urine:1450] Intake/Output this shift: No intake/output data recorded.  Recent Labs    12/29/16 1245 12/31/16 0609  HGB 9.8* 9.9*   Recent Labs    12/29/16 1245 12/31/16 0609  WBC 8.6 18.1*  RBC 3.45* 3.58*  HCT 30.0* 30.6*  PLT 182 169   Recent Labs    12/29/16 1245 12/31/16 0609  NA 137 137  K 4.7 4.1  CL 102 101  CO2 26 23  BUN 28* 30*  CREATININE 1.33* 1.53*  GLUCOSE 90 126*  CALCIUM 9.1 8.9   No results for input(s): LABPT, INR in the last 72 hours.  EXAM General - Patient is Alert, Appropriate and Oriented   Past Medical History:  Diagnosis Date  . Anginal pain ([place])    c/o heaviness a few years ago.  stent placed and all resolved  . Anxiety   . BPH (benign prostatic hypertrophy)   . Carotid stenosis    a. 05/2015 Carotid U/S: mild nonobs atherosclerosis. No need for f/u.  Marland Kitchen Chronic back pain   . Coronary artery disease    a. 06/2013 Cath/PCI: LAD 50-70p (FFR 0.82-->PCI w/ 3.5x24 Promus DES), RCA subtotally occluded w/ L->R collats, LCX 30ost.  . Dyslipidemia   . GERD (gastroesophageal reflux disease)   . Hx of [name]   . Hyperlipidemia   . Hypertension   . Inguinal hernia   . Lightheadedness    a. chronic, somewhat positional.  . 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 Major depression   . Pleural effusion    left  . Sleep apnea    will not use cpap    Assessment/Plan:   1 Day Post-Op Procedure(s) (LRB): KYPHOPLASTY-T10 (N/A) Active Problems:   Acute respiratory failure (HCC)  Estimated body mass index is 24.81 kg/m as calculated from the following:   Height as of this encounter: 5\' 10"  (1.778 m).   Weight as of this encounter: 78.4 kg (172 lb 14.4 oz). Advance diet Up with therapy  Plan is to discharge patient home today pending medical clearance Patient will follow up with [facility] in 2 weeks for xrays  DVT Prophylaxis - Lovenox Weight-Bearing as tolerated    T. Rachelle Hora, PA-C [place] 12/31/2016, 7:26 AM

## 2016-12-31 NOTE — Care Management Obs Status (Signed)
[facility] NOTIFICATION   Patient Details  Name: ANTWAINE BOOMHOWER MRN: [id number] Date of Birth: 04/30/1923   Medicare Observation Status Notification Given:  Yes    Katrina Stack, RN 12/31/2016, 12:02 PM

## 2017-01-01 ENCOUNTER — Ambulatory Visit: Payer: Medicare Other | Admitting: Surgery

## 2017-01-01 MED ORDER — AZITHROMYCIN 250 MG PO TABS: ORAL_TABLET | ORAL | 0 refills | Status: DC

## 2017-01-01 MED ORDER — ALBUTEROL SULFATE HFA 108 (90 BASE) MCG/ACT IN AERS: 2.0000 | INHALATION_SPRAY | Freq: Four times a day (QID) | RESPIRATORY_TRACT | 2 refills | Status: DC | PRN

## 2017-01-01 MED ORDER — FUROSEMIDE 40 MG PO TABS: 40.0000 mg | ORAL_TABLET | Freq: Two times a day (BID) | ORAL | 11 refills | Status: DC

## 2017-01-01 MED ORDER — AMOXICILLIN-POT CLAVULANATE 875-125 MG PO TABS: 1.0000 | ORAL_TABLET | Freq: Two times a day (BID) | ORAL | 0 refills | Status: AC

## 2017-01-01 MED ORDER — IPRATROPIUM-ALBUTEROL 0.5-2.5 (3) MG/3ML IN SOLN: 3.0000 mL | Freq: Four times a day (QID) | RESPIRATORY_TRACT | Status: DC | PRN

## 2017-01-01 NOTE — Progress Notes (Signed)
SATURATION QUALIFICATIONS: (This note is used to comply with regulatory documentation for home oxygen)  Patient Saturations on Room Air at Rest = 90%  Patient Saturations on Room Air while Ambulating = 85%  Patient Saturations on 2 Liters of oxygen while Ambulating = 93 %  Please briefly explain why patient needs home oxygen: desats with exertion.

## 2017-01-01 NOTE — Progress Notes (Signed)
Clarified metoprolol dose with Dr. Brooke Pace his home health nurse will check his blood pressures accordingly and the lasix made his pressures low.

## 2017-01-01 NOTE — Discharge Summary (Addendum)
[name], is a 83 y.o. male  DOB 10/30/1923  MRN [id number].  Admission date:  12/30/2016  Admitting Physician  Hessie Knows, MD  Discharge Date:  01/01/2017   Primary MD  Roselee Nova, MD  Recommendations for primary care physician for things to follow:   Follow  up with PCP in 1 week   Admission Diagnosis  closed wedge compression fracture of tenth thoracic vertebra   Discharge Diagnosis  closed wedge compression fracture of tenth thoracic vertebra    Active Problems:   Acute respiratory failure (HCC)   Postoperative acute respiratory failure (HCC)      Past Medical History:  Diagnosis Date  . Anginal pain ([place])    c/o heaviness a few years ago.  stent placed and all resolved  . Anxiety   . BPH (benign prostatic hypertrophy)   . Carotid stenosis    a. 05/2015 Carotid U/S: mild nonobs atherosclerosis. No need for f/u.  Marland Kitchen Chronic back pain   . Coronary artery disease    a. 06/2013 Cath/PCI: LAD 50-70p (FFR 0.82-->PCI w/ 3.5x24 Promus DES), RCA subtotally occluded w/ L->R collats, LCX 30ost.  . Dyslipidemia   . GERD (gastroesophageal reflux disease)   . Hx of [name]   . Hyperlipidemia   . Hypertension   . Inguinal hernia   . Lightheadedness    a. chronic, somewhat positional.  . 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 Major depression   . Pleural effusion    left  . Sleep apnea    will not use cpap    Past Surgical History:  Procedure Laterality Date  . ANTERIOR APPROACH HEMI HIP ARTHROPLASTY Right 04/16/2016   Performed by Hessie Knows, MD at [facility] ORS  . APPENDECTOMY     83 years old  . arm fracture Right 2008   rod in arm  . CARDIAC CATHETERIZATION  2002  . CARDIAC CATHETERIZATION  06/2013   stent placed  . COLONOSCOPY     . EYE SURGERY Bilateral 2008   cataract extraction  . FEMUR FRACTURE SURGERY Left    with rod  . HEMORROIDECTOMY     75 years ago  . HIP FRACTURE SURGERY    . JOINT REPLACEMENT Bilateral 03/2016   right partial knee replacement, bilateral THR  . KYPHOPLASTY-T10 N/A 12/30/2016   Performed by Hessie Knows, MD at [facility] ORS       History of present illness and  Hospital Course:     Kindly see H&P for history of present illness and admission details, please review complete Labs, Consult reports and Test reports for all details in brief  HPI  from the history and physical done on the day of admission Patient had T10 kyphoplasty by orthopedic and supposed to go home but patient had syncope PACU. Hospital Course  Syncope secondary to dehydration after surgery: No seizures.  Patient refused any more testing including ultrasound of carotids, echo, CT of the head.  Could be thought to be secondary to hypoxia,  O2 saturation 80% on room air in PACU. #2 acute respiratory failure with hypoxia secondary to combination of fluid overload from IV fluids and OR, anesthesia and also not taking deep breath.  Admitted to telemetry to evaluate for a cause of syncope including arrhythmia, initially received 40 mg IV Lasix, patient ABG showed pH of 7.45, PCO2 34, PO2 41 /x-ray showed  interstitial edema, possible left lower lobe atelectasis and also chronic left pleural effusion.  Because of elevated white count of 18.1 yesterday patient received azithromycin,.  Patient is on going home, convinced him that he needs oxygen because of hypoxia oxygen saturation dropping to 85% on room air when ambulating, patient is 2 L of oxygen while ambulating.  Discussed this with wife, we will arrange home health nursing, physical therapy.  Patient will be discharged home with course of Augmentin for 7 days, azithromycin 250 mg daily for 4 days, albuterol inhaler, incentive spirometry.  Patient also will receive Lasix  40 mg twice daily for 3 days followed by Lasix 40 mg p.o. daily.  Adamant that he wants to go home today.  3.   Mild hemoptysis secondary to atelectasis and pneumonia: Yesterday we stopped Lovenox, hemoglobin stable, his hemoptysis resolved today. 4.  Recent kyphoplasty at T12 in addition, patient is seen by physical therapy, IV steroids incentive spirometry, avoid narcotics as much as possible and use Tylenol for pain control.                                                                                                 5 .recent scalp laceration received sutures at [facility] recently and also was given antibiotics.       .                                                                          6 .history of anxiety: Continue Klonopin. 7. chronic pleural effusion on the left side. 8.  Chronic renal failure with baseline creatinine clearance is around 45.,  Chronic kidney disease stage III. una Able to do CT angios chest  To evaluate for PEbecause of renal failure. 9.acute on chronic respiratory failure due to chronic  loculated pleural effusion on the left side.  Discharge Condition: Stable   Follow UP  Follow-up Information    Duanne Guess, PA-C Follow up on 01/15/2017.   Specialties:  Orthopedic Surgery, Emergency Medicine Why:  follow up at 10:15 AM Contact information: [place] [address] [phone]        Roselee Nova, MD Follow up in 1 week(s).   Specialty:  Family Medicine Contact information: [place] [address] [phone]             Discharge Instructions  and  Discharge Medications     Discharge Instructions    Face-to-face encounter (required for Medicare/Medicaid patients)   Complete by:  As directed    I Epifanio Lesches certify that this patient is under my care and that I, or a nurse practitioner or physician's assistant working with me, had a face-to-face encounter that meets the physician  face-to-face encounter requirements with this patient on 01/01/2017. The encounter with the patient was in  whole, or in part for the following medical condition(s) which  Recent kyphoplasty, shortness of breath and hypoxia, pneumonia   The encounter with the patient was in whole, or in part, for the following medical condition, which is the primary reason for home health care:  whole   I certify that, based on my findings, the following services are medically necessary home health services:   Nursing Physical therapy     Reason for Medically Necessary Home Health Services:  Skilled Nursing- Skilled Assessment/Observation   My clinical findings support the need for the above services:  Shortness of breath with activity   Further, I certify that my clinical findings support that this patient is homebound due to:  Shortness of Breath with activity   For home use only DME oxygen   Complete by:  As directed    Mode or (Route):  Nasal cannula   Liters per Minute:  2   Frequency:  Continuous (stationary and portable oxygen unit needed)   Oxygen conserving device:  Yes   Oxygen delivery system:  [place]   Complete by:  As directed    To provide the following care/treatments:   PT RN Social work Respiratory Care OT       Allergies as of 01/01/2017      Reactions   Cyclobenzaprine Other (See Comments)   Hallucination. Mental instability.  DO NOT GIVE ANY MUSCLE RELAXANTS   Baclofen Other (See Comments)   Confusion and weakness   Nsaids    Avoids due to liver.   Sulfamethoxazole-trimethoprim Nausea Only, Other (See Comments)   Patient unaware of this as an allergy   Tolmetin Other (See Comments)   Avoids due to liver.it is an nsaid      Medication List    TAKE these medications   acetaminophen 500 MG tablet Commonly known as:  TYLENOL Take 2 tablets (1,000 mg total) by mouth every 6 (six) hours. What changed:  when to take this   albuterol 108 (90 Base) MCG/ACT  inhaler Commonly known as:  PROVENTIL HFA;VENTOLIN HFA Inhale 2 puffs every 6 (six) hours as needed into the lungs for wheezing or shortness of breath.   amoxicillin-clavulanate 875-125 MG tablet Commonly known as:  AUGMENTIN Take 1 tablet 2 (two) times daily for 14 days by mouth.   azithromycin 250 MG tablet Commonly known as:  ZITHROMAX Daily for 4 days Start taking on:  01/02/2017   bacitracin 500 UNIT/GM ointment Apply 1 application topically 2 (two) times daily.   clonazePAM 0.5 MG tablet Commonly known as:  KLONOPIN Take 1 tablet (0.5 mg total) by mouth 3 (three) times daily as needed for anxiety.   doxazosin 1 MG tablet Commonly known as:  CARDURA Take 1 tablet (1 mg total) by mouth daily.   furosemide 40 MG tablet Commonly known as:  LASIX Take 1 tablet (40 mg total) 2 (two) times daily by mouth. 40 mg p.o. twice daily for 3 days followed by 40 mg p.o. daily   isosorbide mononitrate 30 MG 24 hr tablet Commonly known as:  IMDUR Take 1 tablet (30 mg total) by mouth daily.   lidocaine 5 % Commonly known as:  LIDODERM Place 1 patch onto the skin daily. Remove & Discard patch within 12 hours or as directed by MD   metoprolol tartrate 25 MG tablet Commonly known as:  LOPRESSOR Take 1-2 tablets (25-50 mg total) 2 (two) times daily by mouth. Take 2 tablets ('50mg'$ ) in the morning  and 1 tablet ('25mg'$ ) at bedtime What changed:    how much to take  how to take this  when to take this  additional instructions   nitroGLYCERIN 0.4 MG SL tablet Commonly known as:  NITROSTAT Place 0.4 mg under the tongue every 5 (five) minutes as needed for chest pain.   pantoprazole 40 MG tablet Commonly known as:  PROTONIX Take 1 tablet (40 mg total) daily by mouth.   polyethylene glycol packet Commonly known as:  MIRALAX / GLYCOLAX Take 17 g by mouth daily.   pravastatin 40 MG tablet Commonly known as:  PRAVACHOL Take 1 tablet (40 mg total) by mouth daily.    PRESERVISION/LUTEIN PO Take by mouth 2 (two) times daily.   Probiotic 250 MG Caps Take 1 tablet daily at 6 (six) AM by mouth.   sertraline 100 MG tablet Commonly known as:  ZOLOFT Take 1.5 tablets (150 mg total) by mouth at bedtime.   traMADol 50 MG tablet Commonly known as:  ULTRAM Take 25 mg 3 (three) times daily by mouth.            Durable Medical Equipment  (From admission, onward)        Start     Ordered   01/01/17 0000  For home use only DME oxygen    Question Answer Comment  Mode or (Route) Nasal cannula   Liters per Minute 2   Frequency Continuous (stationary and portable oxygen unit needed)   Oxygen conserving device Yes   Oxygen delivery system Gas      01/01/17 1126        Diet and Activity recommendation: See Discharge Instructions above   Consults obtained - ortho   Major procedures and Radiology Reports - PLEASE review detailed and final reports for all details, in brief -     X-ray Chest Pa Or Ap  Result Date: 12/30/2016 CLINICAL DATA:  Hypoxia post kyphoplasty. EXAM: CHEST 1 VIEW COMPARISON:  04/15/2016 and CT 02/19/2016 FINDINGS: Examination demonstrates no change in opacification over the lower 1/3 to 1/2 of the left hemithorax compatible with known loculated effusion and atelectasis. There is new hazy prominence of the interstitial markings likely interstitial edema. Cardiomediastinal silhouette and remainder of the exam is unchanged. IMPRESSION: New moderate hazy interstitial prominence of the perihilar markings likely interstitial edema. Stable chronic opacification of the lower 1/2 to 1/3 of the left hemithorax compatible with known loculated effusion and atelectasis. Electronically Signed   By: Marin Olp M.D.   On: 12/30/2016 14:31   Dg Thoracic Spine 2 View  Result Date: 12/30/2016 CLINICAL DATA:  T10 vertebral fracture EXAM: DG C-ARM 61-120 MIN; THORACIC SPINE 2 VIEWS COMPARISON:  None. FLUOROSCOPY TIME:  Fluoroscopy Time:  1  minutes 39 seconds Radiation Exposure Index (if provided by the fluoroscopic device): 22.3 mGy Number of Acquired Spot Images: 2 FINDINGS: Contrast laden cement is noted along the superior aspect of T10 within the known fracture line. No significant extravasation of cement is noted. IMPRESSION: T10 kyphoplasty Electronically Signed   By: Inez Catalina M.D.   On: 12/30/2016 13:09   Dg Thoracic Spine 2 View  Result Date: 12/04/2016 CLINICAL DATA:  [place] 2 weeks ago.  Back pain. EXAM: THORACIC SPINE 2 VIEWS; LUMBAR SPINE - COMPLETE 4+ VIEW COMPARISON:  Lumbar spine radiographs September 06, 2014 and MRI of the lumbar spine September 26, 2014 FINDINGS: Thoracic spine: Thoracic vertebral bodies intact and aligned with maintenance of thoracic kyphosis. Intervertebral disc heights preserved, multilevel ventral  bridging osteophytes. No destructive bony lesions. Osteopenia. Prevertebral and paraspinal soft tissue planes are non-suspicious. Calcified aortic arch. Lumbar spine: Mild-to-moderate old, similar L4 compression fracture. Remaining lumbar vertebral bodies intact. Mild L5-S1 disc height loss with multilevel endplate spurring compatible with degenerative discs. Moderate lower lumbar facet arthropathy. Osteopenia without destructive bony lesions. Status post bilateral total hip replacements. Aortoiliac atherosclerosis. Sacroiliac joints are symmetric. IMPRESSION: Thoracic spine: Degenerative change without fracture deformity or malalignment. Lumbar spine: Old mild-to-moderate L4 compression fracture. No acute fracture deformity or malalignment. Stable degenerative change. Aortic Atherosclerosis (ICD10-I70.0). Electronically Signed   By: Elon Alas M.D.   On: 12/04/2016 16:16   Dg Lumbar Spine Complete  Result Date: 12/04/2016 CLINICAL DATA:  [place] 2 weeks ago.  Back pain. EXAM: THORACIC SPINE 2 VIEWS; LUMBAR SPINE - COMPLETE 4+ VIEW COMPARISON:  Lumbar spine radiographs September 06, 2014 and MRI of the lumbar spine  September 26, 2014 FINDINGS: Thoracic spine: Thoracic vertebral bodies intact and aligned with maintenance of thoracic kyphosis. Intervertebral disc heights preserved, multilevel ventral bridging osteophytes. No destructive bony lesions. Osteopenia. Prevertebral and paraspinal soft tissue planes are non-suspicious. Calcified aortic arch. Lumbar spine: Mild-to-moderate old, similar L4 compression fracture. Remaining lumbar vertebral bodies intact. Mild L5-S1 disc height loss with multilevel endplate spurring compatible with degenerative discs. Moderate lower lumbar facet arthropathy. Osteopenia without destructive bony lesions. Status post bilateral total hip replacements. Aortoiliac atherosclerosis. Sacroiliac joints are symmetric. IMPRESSION: Thoracic spine: Degenerative change without fracture deformity or malalignment. Lumbar spine: Old mild-to-moderate L4 compression fracture. No acute fracture deformity or malalignment. Stable degenerative change. Aortic Atherosclerosis (ICD10-I70.0). Electronically Signed   By: Elon Alas M.D.   On: 12/04/2016 16:16   Ct Head Wo Contrast  Result Date: 12/07/2016 CLINICAL DATA:  Status post fall. Hit head. Generalized weakness and confusion. Initial encounter. EXAM: CT HEAD WITHOUT CONTRAST TECHNIQUE: Contiguous axial images were obtained from the base of the skull through the vertex without intravenous contrast. COMPARISON:  CT of the head performed 12/05/2016 FINDINGS: Brain: No evidence of acute infarction, hemorrhage, hydrocephalus, extra-axial collection or mass lesion/mass effect. Prominence of the ventricles and sulci reflects mild to moderate cortical volume loss. Cerebellar atrophy is noted. Scattered periventricular and subcortical white matter change likely reflects small vessel ischemic microangiopathy. The brainstem and fourth ventricle are within normal limits. The basal ganglia are unremarkable in appearance. The cerebral hemispheres demonstrate grossly  normal gray-white differentiation. No mass effect or midline shift is seen. Vascular: No hyperdense vessel or unexpected calcification. Skull: There is no evidence of fracture; visualized osseous structures are unremarkable in appearance. Sinuses/Orbits: The orbits are within normal limits. The paranasal sinuses and mastoid air cells are well-aerated. Other: A soft tissue laceration is noted at the vertex. IMPRESSION: 1. No evidence of traumatic intracranial injury or fracture. 2. Soft tissue laceration at the vertex. 3. Mild to moderate cortical volume loss and scattered small vessel ischemic microangiopathy. Electronically Signed   By: Garald Balding M.D.   On: 12/07/2016 06:31   Ct Head Wo Contrast  Result Date: 12/05/2016 CLINICAL DATA:  83 year old male with acute head and neck injury from fall today. Initial encounter. EXAM: CT HEAD WITHOUT CONTRAST CT CERVICAL SPINE WITHOUT CONTRAST TECHNIQUE: Multidetector CT imaging of the head and cervical spine was performed following the standard protocol without intravenous contrast. Multiplanar CT image reconstructions of the cervical spine were also generated. COMPARISON:  09/12/2016 and prior CTs FINDINGS: CT HEAD FINDINGS Brain: No evidence of acute infarction, hemorrhage, hydrocephalus, extra-axial collection or mass lesion/mass  effect. Atrophy and chronic small-vessel white matter ischemic changes again noted. Vascular: Atherosclerotic calcifications noted. Skull: Normal. Negative for fracture or focal lesion. Sinuses/Orbits: No acute abnormality Other: High left scalp soft tissue swelling identified. CT CERVICAL SPINE FINDINGS Alignment: Normal. Skull base and vertebrae: No acute fracture. No primary bone lesion or focal pathologic process. Soft tissues and spinal canal: No prevertebral fluid or swelling. No visible canal hematoma. Disc levels: Multilevel degenerative disc disease, spondylosis and facet arthropathy identified, greatest at C3-4. Upper chest:  No acute abnormality Other: None IMPRESSION: 1. No evidence of acute intracranial abnormality 2. Left scalp soft tissue swelling without fracture. 3. No static evidence of acute injury to the cervical spine 4. Cerebral atrophy and chronic small-vessel white matter ischemic changes 5. Moderate multilevel degenerative changes within the cervical spine. Electronically Signed   By: Margarette Canada M.D.   On: 12/05/2016 13:59   Ct Cervical Spine Wo Contrast  Result Date: 12/05/2016 CLINICAL DATA:  83 year old male with acute head and neck injury from fall today. Initial encounter. EXAM: CT HEAD WITHOUT CONTRAST CT CERVICAL SPINE WITHOUT CONTRAST TECHNIQUE: Multidetector CT imaging of the head and cervical spine was performed following the standard protocol without intravenous contrast. Multiplanar CT image reconstructions of the cervical spine were also generated. COMPARISON:  09/12/2016 and prior CTs FINDINGS: CT HEAD FINDINGS Brain: No evidence of acute infarction, hemorrhage, hydrocephalus, extra-axial collection or mass lesion/mass effect. Atrophy and chronic small-vessel white matter ischemic changes again noted. Vascular: Atherosclerotic calcifications noted. Skull: Normal. Negative for fracture or focal lesion. Sinuses/Orbits: No acute abnormality Other: High left scalp soft tissue swelling identified. CT CERVICAL SPINE FINDINGS Alignment: Normal. Skull base and vertebrae: No acute fracture. No primary bone lesion or focal pathologic process. Soft tissues and spinal canal: No prevertebral fluid or swelling. No visible canal hematoma. Disc levels: Multilevel degenerative disc disease, spondylosis and facet arthropathy identified, greatest at C3-4. Upper chest: No acute abnormality Other: None IMPRESSION: 1. No evidence of acute intracranial abnormality 2. Left scalp soft tissue swelling without fracture. 3. No static evidence of acute injury to the cervical spine 4. Cerebral atrophy and chronic small-vessel white  matter ischemic changes 5. Moderate multilevel degenerative changes within the cervical spine. Electronically Signed   By: Margarette Canada M.D.   On: 12/05/2016 13:59   Mr Thoracic Spine Wo Contrast  Result Date: 12/23/2016 CLINICAL DATA:  Mid back pain for 1 year. Pain radiates into the lower back and is constant. History of multiple myeloma lymphoblastic lymphoma. EXAM: MRI THORACIC SPINE WITHOUT CONTRAST TECHNIQUE: Multiplanar, multisequence MR imaging of the thoracic spine was performed. No intravenous contrast was administered. COMPARISON:  Radiographs 12/04/2016.  Chest CTs 02/19/2016. FINDINGS: Alignment:  Normal. Vertebrae: There is diffuse ankylosis of the cervicothoracic spine in a pattern most consistent with diffuse idiopathic skeletal hyperostoses. There is probable ankylosis across the C5-6 and C6-7 discs. There is a new abnormality of the T10 vertebral body, not present on the chest CT of 10 months ago. There is linear T2 hyperintensity in the superior endplate most consistent with a fracture. This extends into the posterior elements. There is diffuse marrow edema throughout the T10 vertebral body with obscuration of the normal marrow signal on T1 weighted images. There is low-level endplate edema in the adjacent inferior endplate of T9 and superior endplate of V03. No evidence of intervening discitis. No other suspicious marrow lesions. Cord: Appears mildly atrophied. No cord compression or abnormal cord signal. Paraspinal and other soft tissues: There is extensive paraspinal  edema surrounding the T10 abnormality, asymmetric to the right. There is no focal fluid collection or significant epidural component. Complex loculated left pleural effusion is similar to previous chest CT. Disc levels: No significant disc space findings at or above T5-6. T6-7:  Tiny central disc protrusion.  No cord deformity. T7-8: No significant findings. T8-9:  Small left paracentral disc protrusion.  No cord deformity.  T9-10: Mild disc bulging. Vertebral body and paraspinal abnormalities as described above. T10-11: Moderate size right paracentral disc protrusion contacts the ventral surface of the cord. No abnormal cord signal. T11-12:  No significant findings. T12-L1:  No significant findings. IMPRESSION: 1. New abnormality of the T10 vertebral body, its posterior elements, the adjacent T9 and T11 endplates, and the surrounding paraspinal soft tissues. Given the underlying ankylosis of the spine, these findings could be due to a subacute Chance fracture with surrounding inflammation. However, a pathologic fracture at T10 is a consideration, especially given the patient's history of multiple myeloma and lymphoma. 2. Further evaluation recommended, starting with CT of the thoracic spine. Depending on the results of that examination, one might consider post-contrast imaging of the thoracic spine or percutaneous T10 biopsy during spinal augmentation. 3. No signs of disc space infection, epidural fluid collection or cord compression. 4. Scattered disc protrusions as described, largest on the right at T10-11. 5. Stable loculated left pleural effusion. 6. These results will be called to the ordering clinician or representative by the Radiologist Assistant, and communication documented in the PACS or zVision Dashboard. Electronically Signed   By: Richardean Sale M.D.   On: 12/23/2016 15:29   Dg Chest Port 1 View  Result Date: 12/31/2016 CLINICAL DATA:  Hypoxia EXAM: PORTABLE CHEST 1 VIEW COMPARISON:  Yesterday FINDINGS: Pleural effusion at the left base that was loculated based on 02/19/2016 chest CT. Interstitial coarsening is borderline improved from yesterday, likely edema. Non obscured heart size and contours is stable. No pneumothorax. IMPRESSION: 1. Borderline improved interstitial opacity that is likely from edema. 2. Chronic loculated left pleural effusion. Electronically Signed   By: Monte Fantasia M.D.   On: 12/31/2016  07:04   Dg C-arm 1-60 Min  Result Date: 12/30/2016 CLINICAL DATA:  T10 vertebral fracture EXAM: DG C-ARM 61-120 MIN; THORACIC SPINE 2 VIEWS COMPARISON:  None. FLUOROSCOPY TIME:  Fluoroscopy Time:  1 minutes 39 seconds Radiation Exposure Index (if provided by the fluoroscopic device): 22.3 mGy Number of Acquired Spot Images: 2 FINDINGS: Contrast laden cement is noted along the superior aspect of T10 within the known fracture line. No significant extravasation of cement is noted. IMPRESSION: T10 kyphoplasty Electronically Signed   By: Inez Catalina M.D.   On: 12/30/2016 13:09    Micro Results    No results found for this or any previous visit (from the past 240 hour(s)).     Today   Subjective:   [name] today has no headache,no chest abdominal pain,no new weakness tingling or numbness, feels much better wants to go home today.  Objective:   Blood pressure (!) 116/46, pulse (!) 104, temperature 98.3 F (36.8 C), temperature source Oral, resp. rate 20, height '5\' 10"'$  (1.778 m), weight 78.4 kg (172 lb 14.4 oz), SpO2 90 %.   Intake/Output Summary (Last 24 hours) at 01/01/2017 1128 Last data filed at 01/01/2017 1015 Gross per 24 hour  Intake 480 ml  Output 625 ml  Net -145 ml    Exam Awake Alert, Oriented x 3, No new F.N deficits, Normal affect [place].AT,PERRAL Supple Neck,No  JVD, No cervical lymphadenopathy appriciated.  Symmetrical Chest wall movement, Good air movement bilaterally, CTAB RRR,No Gallops,Rubs or new Murmurs, No Parasternal Heave +ve B.Sounds, Abd Soft, Non tender, No organomegaly appriciated, No rebound -guarding or rigidity. No Cyanosis, Clubbing or edema, No new Rash or bruise  Data Review   CBC w Diff:  Lab Results  Component Value Date   WBC 16.9 (H) 12/31/2016   HGB 10.5 (L) 12/31/2016   HGB 8.7 (L) 12/18/2013   HCT 32.8 (L) 12/31/2016   HCT 25.6 (L) 12/17/2013   PLT 165 12/31/2016   PLT 156 12/17/2013   LYMPHOPCT 18 09/14/2016   LYMPHOPCT 4.0  12/17/2013   MONOPCT 10 09/14/2016   MONOPCT 8.6 12/17/2013   EOSPCT 1 09/14/2016   EOSPCT 0.7 12/17/2013   BASOPCT 0 09/14/2016   BASOPCT 0.1 12/17/2013    CMP:  Lab Results  Component Value Date   NA 137 12/31/2016   NA 139 09/06/2014   NA 136 12/18/2013   K 4.1 12/31/2016   K 4.5 12/18/2013   CL 101 12/31/2016   CL 103 12/18/2013   CO2 23 12/31/2016   CO2 26 12/18/2013   BUN 30 (H) 12/31/2016   BUN 27 09/06/2014   BUN 14 12/18/2013   CREATININE 1.53 (H) 12/31/2016   CREATININE 1.38 (H) 09/14/2016   PROT 8.0 05/12/2016   PROT 7.9 09/06/2014   PROT 9.3 (H) 06/03/2013   ALBUMIN 3.5 05/12/2016   ALBUMIN 4.0 09/06/2014   ALBUMIN 3.6 06/03/2013   BILITOT 0.6 05/12/2016   BILITOT 0.4 09/06/2014   BILITOT 0.5 06/03/2013   ALKPHOS 115 05/12/2016   ALKPHOS 94 06/03/2013   AST 22 05/12/2016   AST 28 06/03/2013   ALT 10 (L) 05/12/2016   ALT 21 06/03/2013  .   Total Time in preparing paper work, data evaluation and todays exam - 35 minutes  Epifanio Lesches M.D on 01/01/2017 at 11:28 AM    Note: This dictation was prepared with Dragon dictation along with smaller phrase technology. Any transcriptional errors that result from this process are unintentional.

## 2017-01-01 NOTE — Care Management Note (Signed)
Case Management Note  Patient Details  Name: [name] MRN: [id number] Date of Birth: [birth year]  Subjective/Objective:                 Patient is going to require home 02 and no agency preference.  Referral called to and accepted by [facility] for the oxygen. Obtained order for home health SN PT OT and Aide from attending and requested to have SW and respiratory therapy removed. Notified [facility] of discharge and will make initial visit 11/17.   Action/Plan:         Expected Discharge Date:  01/01/17               Expected Discharge Plan:     In-House Referral:     Discharge planning Services  CM Consult  Post Acute Care Choice:  Durable Medical Equipment Choice offered to:  Patient, Spouse(currently open to [facility])  DME Arranged:  Oxygen DME Agency:  [place]:  RN, PT, OT, Nurse's Aide [place] Agency:  [place]  Status of Service:  Completed, signed off  If discussed at [place] of Stay Meetings, dates discussed:    Additional Comments:  Katrina Stack, RN 01/01/2017, 3:44 PM

## 2017-01-01 NOTE — Care Management Important Message (Signed)
Important Message  Patient Details  Name: DEHAVEN SINE MRN: [id number] Date of Birth: 08-07-1923   Medicare Important Message Given:  Yes Signed IM notice given    Katrina Stack, RN 01/01/2017, 3:43 PM

## 2017-01-01 NOTE — Progress Notes (Signed)
IV and tele removed from patient. Discharge instructions given to patient and wife. Verbalized understanding. No distress at this time.Oxygen was delivered. Wife to transport patient home.

## 2017-01-01 NOTE — Progress Notes (Signed)
   Subjective: 2 Days Post-Op Procedure(s) (LRB): KYPHOPLASTY-T10 (N/A) Patient reports back pain has improved.  Pain is 0 out of 10.  Had no pain yesterday with physical therapy.  Mild pain with rotation and turning in the bed.  Her breathing is improving.  Medicine is following. Patient is well, and has had no acute complaints or problems  .  Objective: Vital signs in last 24 hours: Temp:  [98.2 F (36.8 C)-98.3 F (36.8 C)] 98.3 F (36.8 C) (11/16 1012) Pulse Rate:  [84-104] 104 (11/16 1012) Resp:  [18-20] 20 (11/16 1012) BP: (99-116)/(46-55) 116/46 (11/16 1012) SpO2:  [90 %-99 %] 90 % (11/16 1012)  Intake/Output from previous day: 11/15 0701 - 11/16 0700 In: 120 [P.O.:120] Out: 625 [Urine:625] Intake/Output this shift: Total I/O In: 360 [P.O.:360] Out: -   Recent Labs    12/29/16 1245 12/31/16 0609 12/31/16 0939  HGB 9.8* 9.9* 10.5*   Recent Labs    12/31/16 0609 12/31/16 0939  WBC 18.1* 16.9*  RBC 3.58* 3.78*  HCT 30.6* 32.8*  PLT 169 165   Recent Labs    12/29/16 1245 12/31/16 0609  NA 137 137  K 4.7 4.1  CL 102 101  CO2 26 23  BUN 28* 30*  CREATININE 1.33* 1.53*  GLUCOSE 90 126*  CALCIUM 9.1 8.9   No results for input(s): LABPT, INR in the last 72 hours.  EXAM General - Patient is Alert, Appropriate and Oriented.  Oxygen via nasal cannula.  No respiratory distress.   Lower extremities without swelling Thoracic spine without tenderness to percussion along the spinous process.  No paravertebral muscle tenderness.  Band-Aid is intact with no redness warmth or drainage noted.    Past Medical History:  Diagnosis Date  . Anginal pain ([place])    c/o heaviness a few years ago.  stent placed and all resolved  . Anxiety   . BPH (benign prostatic hypertrophy)   . Carotid stenosis    a. 05/2015 Carotid U/S: mild nonobs atherosclerosis. No need for f/u.  Marland Kitchen Chronic back pain   . Coronary artery disease    a. 06/2013 Cath/PCI: LAD 50-70p (FFR  0.82-->PCI w/ 3.5x24 Promus DES), RCA subtotally occluded w/ L->R collats, LCX 30ost.  . Dyslipidemia   . GERD (gastroesophageal reflux disease)   . Hx of [name]   . Hyperlipidemia   . Hypertension   . Inguinal hernia   . Lightheadedness    a. chronic, somewhat positional.  . 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 Major depression   . Pleural effusion    left  . Sleep apnea    will not use cpap    Assessment/Plan:   2 Days Post-Op Procedure(s) (LRB): KYPHOPLASTY-T10 (N/A) Active Problems:   Acute respiratory failure (HCC)   Postoperative acute respiratory failure (HCC)  Estimated body mass index is 24.81 kg/m as calculated from the following:   Height as of this encounter: 5\' 10"  (1.778 m).   Weight as of this encounter: 78.4 kg (172 lb 14.4 oz). Advance diet Up with therapy  Plan is to discharge patient home today pending medical clearance Patient will follow up with [place] [facility] in 2 weeks for xrays   DVT Prophylaxis - Lovenox Weight-Bearing as tolerated    T. Rachelle Hora, PA-C [place] 01/01/2017, 11:34 AM

## 2017-01-05 ENCOUNTER — Encounter: Payer: Medicare Other | Attending: Internal Medicine | Admitting: Internal Medicine

## 2017-01-05 DIAGNOSIS — E785 Hyperlipidemia, unspecified: Secondary | ICD-10-CM | POA: Insufficient documentation

## 2017-01-05 DIAGNOSIS — K219 Gastro-esophageal reflux disease without esophagitis: Secondary | ICD-10-CM | POA: Insufficient documentation

## 2017-01-05 DIAGNOSIS — F419 Anxiety disorder, unspecified: Secondary | ICD-10-CM | POA: Insufficient documentation

## 2017-01-05 DIAGNOSIS — S0101XD Laceration without foreign body of scalp, subsequent encounter: Secondary | ICD-10-CM | POA: Insufficient documentation

## 2017-01-05 DIAGNOSIS — Z886 Allergy status to analgesic agent status: Secondary | ICD-10-CM | POA: Diagnosis not present

## 2017-01-05 DIAGNOSIS — G4730 Sleep apnea, unspecified: Secondary | ICD-10-CM | POA: Diagnosis not present

## 2017-01-05 DIAGNOSIS — Z9221 Personal history of antineoplastic chemotherapy: Secondary | ICD-10-CM | POA: Diagnosis not present

## 2017-01-05 DIAGNOSIS — I10 Essential (primary) hypertension: Secondary | ICD-10-CM | POA: Diagnosis not present

## 2017-01-05 DIAGNOSIS — I2510 Atherosclerotic heart disease of native coronary artery without angina pectoris: Secondary | ICD-10-CM | POA: Insufficient documentation

## 2017-01-05 DIAGNOSIS — Z87891 Personal history of nicotine dependence: Secondary | ICD-10-CM | POA: Diagnosis not present

## 2017-01-05 DIAGNOSIS — W19XXXD Unspecified fall, subsequent encounter: Secondary | ICD-10-CM | POA: Insufficient documentation

## 2017-01-06 NOTE — Progress Notes (Signed)
CASSIDY, TABET ([id number]) Visit Report for 01/05/2017 Abuse/Suicide Risk Screen Details Patient Name: [name], [name] Date of Service: 01/05/2017 9:30 AM Medical Record Number: [id number] Patient Account Number: [id number] Date of Birth/Sex: Aug 12, 1923 (83 y.o. Male) Treating RN: Roger Shelter Primary Care Javarus Dorner: [facility], [facility] Other Clinician: Referring Saman Umstead: Hessie Knows Treating Elisse Pennick/Extender: Ricard Dillon Weeks in Treatment: 0 Abuse/Suicide Risk Screen Items Answer ABUSE/SUICIDE RISK SCREEN: Has anyone close to you tried to hurt or harm you recentlyo No Do you feel uncomfortable with anyone in your familyo No Has anyone forced you do things that you didnot want to doo No Do you have any thoughts of harming yourselfo No Patient displays signs or symptoms of abuse and/or neglect. No Electronic Signature(s) Signed: 01/05/2017 4:36:56 PM By: Roger Shelter Entered By: Roger Shelter on 01/05/2017 09:49:07 [name], [name] ([id number]) -------------------------------------------------------------------------------- Activities of Daily Living Details Patient Name: [name], [name] Date of Service: 01/05/2017 9:30 AM Medical Record Number: [id number] Patient Account Number: [id number] Date of Birth/Sex: Aug 06, 1923 (83 y.o. Male) Treating RN: Roger Shelter Primary Care Grabiel Schmutz: [facility], [facility] Other Clinician: Referring Cana Mignano: Hessie Knows Treating Riti Rollyson/Extender: Ricard Dillon Weeks in Treatment: 0 Activities of Daily Living Items Answer Activities of Daily Living (Please select one for each item) Drive Automobile Not Able Take Medications Not Able Use Telephone Need Assistance Care for Appearance Need Assistance Use Toilet Need Assistance Bath / Shower Need Assistance Dress Self Need Assistance Feed Self Need Assistance Walk Not Able Get In / Out Bed Need Assistance Housework Not Able Prepare Meals Need Assistance Handle Money Need Assistance Shop  for Self Need Assistance Electronic Signature(s) Signed: 01/05/2017 4:36:56 PM By: Roger Shelter Entered By: Roger Shelter on 01/05/2017 09:49:58 [name], [name] ([id number]) -------------------------------------------------------------------------------- Education Assessment Details Patient Name: [name], [name] Date of Service: 01/05/2017 9:30 AM Medical Record Number: [id number] Patient Account Number: [id number] Date of Birth/Sex: 1923/12/06 (84 y.o. Male) Treating RN: Roger Shelter Primary Care Anastashia Westerfeld: [facility], [facility] Other Clinician: Referring Kei Langhorst: Hessie Knows Treating Riyan Gavina/Extender: Tito Dine in Treatment: 0 Primary Learner Assessed: Caregiver wife patient referred to wife to Reason Patient is not Primary Learner: answer questions Learning Preferences/Education Level/Primary Language Learning Preference: Explanation Highest Education Level: College or Above Preferred Language: English Cognitive Barrier Assessment/Beliefs Language Barrier: No Translator Needed: No Memory Deficit: No Emotional Barrier: No Cultural/Religious Beliefs Affecting Medical Care: No Physical Barrier Assessment Impaired Vision: Yes Glasses Impaired Hearing: Yes Hearing Aid, bilateral Decreased Hand dexterity: No Motivation Assessment Anxiety Level: Calm Cooperation: Cooperative Education Importance: Acknowledges Need Interest in Health Problems: Asks Questions Perception: Coherent Willingness to Engage in Self- High Management Activities: Readiness to Engage in Self- High Management Activities: Electronic Signature(s) Signed: 01/05/2017 4:36:56 PM By: Roger Shelter Entered By: Roger Shelter on 01/05/2017 09:51:42 [name], [name] ([id number]) -------------------------------------------------------------------------------- Fall Risk Assessment Details Patient Name: [name], [name] Date of Service: 01/05/2017 9:30 AM Medical Record Number: [id number] Patient  Account Number: [id number] Date of Birth/Sex: Nov 29, 1923 (83 y.o. Male) Treating RN: Roger Shelter Primary Care Elsia Lasota: [facility], [facility] Other Clinician: Referring Ahjanae Cassel: Hessie Knows Treating Samy Ryner/Extender: Tito Dine in Treatment: 0 Fall Risk Assessment Items Have you had 2 or more falls in the last 12 monthso 0 Yes Have you had any fall that resulted in injury in the last 12 monthso 0 Yes FALL RISK ASSESSMENT: History of falling - immediate or within 3 months 25 Yes Secondary diagnosis 15 Yes Ambulatory aid None/bed rest/wheelchair/nurse 0 Yes Crutches/cane/walker 15 Yes Furniture 0 No IV  Access/Saline Lock 0 No Gait/Training Normal/bed rest/immobile 0 No Weak 0 No Impaired 20 Yes Mental Status Oriented to own ability 0 Yes Electronic Signature(s) Signed: 01/05/2017 4:36:56 PM By: Roger Shelter Entered By: Roger Shelter on 01/05/2017 09:53:00 [name], [name] ([id number]) -------------------------------------------------------------------------------- Foot Assessment Details Patient Name: [name], [name] Date of Service: 01/05/2017 9:30 AM Medical Record Number: [id number] Patient Account Number: [id number] Date of Birth/Sex: Jun 05, 1923 (82 y.o. Male) Treating RN: Roger Shelter Primary Care Iridian Reader: [facility], [facility] Other Clinician: Referring Zanyiah Posten: Hessie Knows Treating Dariana Garbett/Extender: Ricard Dillon Weeks in Treatment: 0 Foot Assessment Items Site Locations + = Sensation present, - = Sensation absent, C = Callus, U = Ulcer R = Redness, W = Warmth, M = Maceration, PU = Pre-ulcerative lesion F = Fissure, S = Swelling, D = Dryness Assessment Right: Left: Other Deformity: No No Prior Foot Ulcer: No No Prior Amputation: No No Charcot Joint: No No Ambulatory Status: Ambulatory With Help Assistance Device: Wheelchair Gait: Buyer, retail Signature(s) Signed: 01/05/2017 4:36:56 PM By: Roger Shelter Entered By: Roger Shelter on  01/05/2017 09:54:00 [name], [name] ([id number]) -------------------------------------------------------------------------------- Nutrition Risk Assessment Details Patient Name: Bonawitz, Dontravious L. Date of Service: 01/05/2017 9:30 AM Medical Record Number: [id number] Patient Account Number: [id number] Date of Birth/Sex: [birth year] (83 y.o. Male) Treating RN: Roger Shelter Primary Care Charmagne Buhl: [facility], [facility] Other Clinician: Referring Mariajose Mow: [facility], MICHAEL Treating Srinidhi Landers/Extender: Ricard Dillon Weeks in Treatment: 0 Height (in): 70 Weight (lbs): 181 Body Mass Index (BMI): 26 Nutrition Risk Assessment Items NUTRITION RISK SCREEN: I have an illness or condition that made me change the kind and/or amount of 2 Yes food I eat I eat fewer than two meals per day 0 No I eat few fruits and vegetables, or milk products 0 No I have three or more drinks of beer, liquor or wine almost every day 0 No I have tooth or mouth problems that make it hard for me to eat 0 No I don't always have enough money to buy the food I need 0 No I eat alone most of the time 0 No I take three or more different prescribed or over-the-counter drugs a day 0 No Without wanting to, I have lost or gained 10 pounds in the last six months 0 No I am not always physically able to shop, cook and/or feed myself 0 No Nutrition Protocols Good Risk Protocol 0 No interventions needed Moderate Risk Protocol Electronic Signature(s) Signed: 01/05/2017 4:36:56 PM By: Roger Shelter Entered By: Roger Shelter on 01/05/2017 09:53:37

## 2017-01-07 NOTE — Progress Notes (Addendum)
SRIKAR, CHIANG ([id number]) Visit Report for 01/05/2017 Chief Complaint Document Details Patient Name: [name], [name] Date of Service: 01/05/2017 9:30 AM Medical Record Number: [id number] Patient Account Number: [id number] Date of Birth/Sex: 12/18/1923 (83 y.o. Male) Treating RN: Roger Shelter Primary Care Provider: [facility], [facility] Other Clinician: Referring Provider: [facility], [facility] Treating Provider/Extender: Tito Dine in Treatment: 0 Information Obtained from: Patient Chief Complaint Patient presents to the wound care center for a consult due non healing wound to his right elbow, left knee, left upper arm and left upper back which she's had for about a week 01/05/17; patient returns for review of an initial laceration wound on the occiput of his scalp Electronic Signature(s) Signed: 01/06/2017 8:07:00 AM By: Linton Ham MD Entered By: Linton Ham on 01/05/2017 09:53:29 [place], [place]. ([id number]) -------------------------------------------------------------------------------- Debridement Details Patient Name: [name], [name] Date of Service: 01/05/2017 9:30 AM Medical Record Number: [id number] Patient Account Number: [id number] Date of Birth/Sex: 09/13/1923 (83 y.o. Male) Treating RN: Roger Shelter Primary Care Provider: [facility], [facility] Other Clinician: Referring Provider: [facility], [facility] Treating Provider/Extender: Tito Dine in Treatment: 0 Debridement Performed for Wound #9 Head - Parietal Assessment: Performed By: Physician Ricard Dillon, MD Debridement: Open Wound/Selective Debridement Description: Selective Pre-procedure Verification/Time Yes - 10:05 Out Taken: Start Time: 10:05 Pain Control: Other : lidocaine 4% Level: Non-Viable Tissue Total Area Debrided (L x W): 4.5 (cm) x 1.5 (cm) = 6.75 (cm) Tissue and other material Viable, Non-Viable, Eschar, Skin debrided: Instrument: Curette Bleeding: Minimum Hemostasis Achieved: Pressure End  Time: 10:08 Procedural Pain: 2 Post Procedural Pain: 0 Response to Treatment: Procedure was tolerated well Post Debridement Measurements of Total Wound Length: (cm) 4.5 Width: (cm) 1.5 Depth: (cm) 0.1 Volume: (cm) 0.53 Character of Wound/Ulcer Post Debridement: Stable Post Procedure Diagnosis Same as Pre-procedure Electronic Signature(s) Signed: 01/05/2017 4:36:56 PM By: Roger Shelter Signed: 01/06/2017 8:07:00 AM By: Linton Ham MD Entered By: Linton Ham on 01/05/2017 10:15:41 [name], [name] ([id number]) -------------------------------------------------------------------------------- HPI Details Patient Name: [name], [name] Date of Service: 01/05/2017 9:30 AM Medical Record Number: [id number] Patient Account Number: [id number] Date of Birth/Sex: 20-Jan-1924 (83 y.o. Male) Treating RN: Roger Shelter Primary Care Provider: [facility], [facility] Other Clinician: Referring Provider: [facility], [facility] Treating Provider/Extender: Ricard Dillon Weeks in Treatment: 0 History of Present Illness Location: right elbow, left knee, left upper arm and left upper back Quality: Patient reports experiencing a dull pain to affected area(s). Severity: Patient states wound (s) are getting better. Duration: Patient has had the wound for < 1 weeks prior to presenting for treatment Timing: Pain in wound is Intermittent (comes and goes Context: The wound occurred when the patient had a fall and hurt his back Modifying Factors: Other treatment(s) tried include:local care with Neosporin ointment Associated Signs and Symptoms: Patient reports having:scar tissue opens out every time they do dressing HPI Description: 83 year old patient who was in [place] in May a couple of times for a lacerated wound which was healed over a couple of weeks. He now comes with recent injuries to his back and knees and both arms sustained after a fall. He did not seek any medical help and they use dressing material which was  previously there. Past medical history significant for carotid stenosis, [name], [name], dyslipidemia, hypertension, anxiety, GERD, multiple myeloma, BPH, inguinal hernia and is status post appendectomy, hip fracture, cardiac catheterization. He smoked in the remote past and has not smoked for several years. He has had long-term low back pain and was  evaluated for pain radiating to the left groin. 02-06-16 Mr. Dedman progress for evaluation of multiple skin tears, he is accompanied by his wife. Neither patient nor his wife expressed the concerns regarding his wounds and/or the treatment plan. They deny any falls since his last appointment or any new injuries. READMISSION this is a 83 year old man who is been seen in the clinic in 2017 for multiple skin tears after falling. These were fairly easy to close over unfortunately the patient's recent course is not gone well. He fell and suffered a right hip fracture at the beginning of March. He was discharged to a skilled facility and sometime during the stay at the skilled facility his daughter noted a bandage on his left elbow. It wasn't told later that they learned he had fallen out of bed sustaining an injury. After discharge a week ago he apparently had a off-balance issue falling against the wall and hit the left elbow again. Been using peroxide and Neosporin. Seen by the family doctor yesterday and x-ray of the left elbow was negative. He was put on doxycycline. The patient has a history of coronary artery disease status post stent, [name], hypertension, lumbar spinal stenosis, multiple myeloma and BPH. He is apparently had bilateral hip replacements secondary to falling with the most recent one being on the right. He states that on the times he has fallen he has got up to go to the bathroom and becomes "swimmy headed" although he does not describe the off-balance sensation were reproducibly with changes in position 06/10/16; left elbow  wounds look improved this is on the posterior aspect over the olecranon area. Healthy-looking granulation. The Prisma apparently stuck to the wound bed at least as being applied by home health. We changed to [facility]. 06/17/16; the areas over the left elbow look improved. There are 2 small open areas. Light surface debridement to remove surface eschar. 06/24/16; left elbow as predicted last week has epithelialized as predicted HOWEVER today he arrives with blisters in a perfect distribution limited to the wound. 07/01/16; the entire area of his elbow looks better this week. Only one open area remains 07/08/16; the open areas around his left elbow look better. This is a frail man with a history of falls. He has multiple myeloma by history and lumbar spinal stenosis. He uses a walker. He has actinic keratosis, actinic purpura and xerotic skin. He is severely damaged skin on his forearms READMISSION [place], Shantel L. ([id number]) 01/05/17; this is a elderly man that we've had in clinic multiple times most recently in the earlier part of this year for a wound on the left and olecranon area which was traumatic. His current problem started on 12/05/16 when he sustained a fall with a large scalp laceration over the occiput. This was repaired with several staples and stitches. There was bleeding noted on 11/2 from the wound which was not observed in the ER and he was sent home. His sutures were removed on 12/20/16 however he was seen again on 12/21/16 in the ER with bleeding from the wound. At that time there was extensive debridement of granulation tissue and redundant skin flap was performed to approximate skin borders. The wound was stapled. He was referred back to his primary doctor for removal of the staples however the primary doctor sent him here. In the meantime he has undergone a kyphoplasty at 12/30/16 of T10 looking through [employer]) Signed: 01/06/2017 8:07:00 AM By: Linton Ham MD Entered By: Linton Ham  on 01/05/2017 10:13:52 [name], [name] ([id number]) -------------------------------------------------------------------------------- Physical Exam Details Patient Name: [name], [name] Date of Service: 01/05/2017 9:30 AM Medical Record Number: [id number] Patient Account Number: [id number] Date of Birth/Sex: 07/28/1923 (83 y.o. Male) Treating RN: Roger Shelter Primary Care Provider: [facility], [place] Other Clinician: Referring Provider: [facility], [facility] Treating Provider/Extender: Ricard Dillon Weeks in Treatment: 0 Constitutional Sitting or standing Blood Pressure is within target range for patient.. Pulse regular and within target range for patient.Marland Kitchen Respirations regular, non-labored and within target range.. Temperature is normal and within the target range for the patient.Marland Kitchen appears in no distress. Respiratory Respiratory effort is easy and symmetric bilaterally. Rate is normal at rest and on room air.Marland Kitchen Psychiatric Patient is bright and alert able to give his own history. Notes Wound exam; I removed 13 sutures from the scalp. Then using a #5 curet necrotic surface debris removed from the wound surface. There is still small open areas which appear superficial and healthy. Most of the rest of this wound has held together nicely. There is no evidence of surrounding infection Electronic Signature(s) Signed: 01/06/2017 8:07:00 AM By: Linton Ham MD Entered By: Linton Ham on 01/05/2017 10:16:47 [name], [name] ([id number]) -------------------------------------------------------------------------------- Physician Orders Details Patient Name: [name], [name] Date of Service: 01/05/2017 9:30 AM Medical Record Number: [id number] Patient Account Number: [id number] Date of Birth/Sex: 07/11/1923 (83 y.o. Male) Treating RN: Roger Shelter Primary Care Provider: [facility], [facility] Other Clinician: Referring Provider: [facility], [facility] Treating Provider/Extender: Tito Dine in Treatment: 0 Verbal / Phone Orders: No Diagnosis Coding ICD-10 Coding Code Description S01.01XD Laceration without foreign body of scalp, subsequent encounter Wound Cleansing Wound #9 Head - Parietal o Clean wound with Normal Saline. Anesthetic Wound #9 Head - Parietal o Topical Lidocaine 4% cream applied to wound bed prior to debridement - in clinic Primary Wound Dressing Wound #9 Head - Parietal o Silvercel Non-Adherent Secondary Dressing Wound #9 Head - Parietal o Other - coverlet Dressing Change Frequency Wound #9 Head - Parietal o Change dressing every other day. Follow-up Appointments Wound #9 Head - Parietal o Return Appointment in 1 week. Electronic Signature(s) Signed: 01/05/2017 4:36:56 PM By: Roger Shelter Signed: 01/06/2017 8:07:00 AM By: Linton Ham MD Entered By: Roger Shelter on 01/05/2017 10:15:35 [name], [name] ([id number]) -------------------------------------------------------------------------------- Problem List Details Patient Name: [name], [name] Date of Service: 01/05/2017 9:30 AM Medical Record Number: [id number] Patient Account Number: [id number] Date of Birth/Sex: 28-Nov-1923 (83 y.o. Male) Treating RN: Roger Shelter Primary Care Provider: [facility], [facility] Other Clinician: Referring Provider: [facility], [facility] Treating Provider/Extender: Tito Dine in Treatment: 0 Active Problems ICD-10 Encounter Code Description Active Date Diagnosis S01.01XD Laceration without foreign body of scalp, subsequent encounter 01/05/2017 Yes Inactive Problems Resolved Problems Electronic Signature(s) Signed: 01/06/2017 8:07:00 AM By: Linton Ham MD Entered By: Linton Ham on 01/05/2017 09:45:26 [place], Jumaane L. ([id number]) -------------------------------------------------------------------------------- Progress Note Details Patient Name: [name], [name] Date of Service: 01/05/2017 9:30 AM Medical Record Number:  [id number] Patient Account Number: [id number] Date of Birth/Sex: 06/17/1923 (83 y.o. Male) Treating RN: Roger Shelter Primary Care Provider: [facility], [facility] Other Clinician: Referring Provider: [facility], [facility] Treating Provider/Extender: Tito Dine in Treatment: 0 Subjective Chief Complaint Information obtained from Patient Patient presents to the wound care center for a consult due non healing wound to his right elbow, left knee, left upper arm and left upper back which she's had for about a week 01/05/17; patient returns for review of an initial laceration wound on the occiput of  his scalp History of Present Illness (HPI) The following HPI elements were documented for the patient's wound: Location: right elbow, left knee, left upper arm and left upper back Quality: Patient reports experiencing a dull pain to affected area(s). Severity: Patient states wound (s) are getting better. Duration: Patient has had the wound for < 1 weeks prior to presenting for treatment Timing: Pain in wound is Intermittent (comes and goes Context: The wound occurred when the patient had a fall and hurt his back Modifying Factors: Other treatment(s) tried include:local care with Neosporin ointment Associated Signs and Symptoms: Patient reports having:scar tissue opens out every time they do dressing 83 year old patient who was in [place] in May a couple of times for a lacerated wound which was healed over a couple of weeks. He now comes with recent injuries to his back and knees and both arms sustained after a fall. He did not seek any medical help and they use dressing material which was previously there. Past medical history significant for carotid stenosis, [name], [name], dyslipidemia, hypertension, anxiety, GERD, multiple myeloma, BPH, inguinal hernia and is status post appendectomy, hip fracture, cardiac catheterization. He smoked in the remote past and has not smoked for several years. He has had  long-term low back pain and was evaluated for pain radiating to the left groin. 02-06-16 Mr. Thorstenson progress for evaluation of multiple skin tears, he is accompanied by his wife. Neither patient nor his wife expressed the concerns regarding his wounds and/or the treatment plan. They deny any falls since his last appointment or any new injuries. READMISSION this is a 83 year old man who is been seen in the clinic in 2017 for multiple skin tears after falling. These were fairly easy to close over unfortunately the patient's recent course is not gone well. He fell and suffered a right hip fracture at the beginning of March. He was discharged to a skilled facility and sometime during the stay at the skilled facility his daughter noted a bandage on his left elbow. It wasn't told later that they learned he had fallen out of bed sustaining an injury. After discharge a week ago he apparently had a off-balance issue falling against the wall and hit the left elbow again. Been using peroxide and Neosporin. Seen by the family doctor yesterday and x-ray of the left elbow was negative. He was put on doxycycline. The patient has a history of coronary artery disease status post stent, [name], hypertension, lumbar spinal stenosis, multiple myeloma and BPH. He is apparently had bilateral hip replacements secondary to falling with the most recent one being on the right. He states that on the times he has fallen he has got up to go to the bathroom and becomes "swimmy headed" although he does not describe the off-balance sensation were reproducibly with changes in position 06/10/16; left elbow wounds look improved this is on the posterior aspect over the olecranon area. Healthy-looking granulation. The Prisma apparently stuck to the wound bed at least as being applied by home health. We changed to [facility]. 06/17/16; the areas over the left elbow look improved. There are 2 small open areas. Light surface  debridement to remove Marulanda, Cheng L. ([id number]) surface eschar. 06/24/16; left elbow as predicted last week has epithelialized as predicted HOWEVER today he arrives with blisters in a perfect distribution limited to the wound. 07/01/16; the entire area of his elbow looks better this week. Only one open area remains 07/08/16; the open areas around his left elbow look better.  This is a frail man with a history of falls. He has multiple myeloma by history and lumbar spinal stenosis. He uses a walker. He has actinic keratosis, actinic purpura and xerotic skin. He is severely damaged skin on his forearms READMISSION 01/05/17; this is a elderly man that we've had in clinic multiple times most recently in the earlier part of this year for a wound on the left and olecranon area which was traumatic. His current problem started on 12/05/16 when he sustained a fall with a large scalp laceration over the occiput. This was repaired with several staples and stitches. There was bleeding noted on 11/2 from the wound which was not observed in the ER and he was sent home. His sutures were removed on 12/20/16 however he was seen again on 12/21/16 in the ER with bleeding from the wound. At that time there was extensive debridement of granulation tissue and redundant skin flap was performed to approximate skin borders. The wound was stapled. He was referred back to his primary doctor for removal of the staples however the primary doctor sent him here. In the meantime he has undergone a kyphoplasty at 12/30/16 of T10 looking through [employer] Wound History Patient presents with 1 open wound that has been present for approximately 2 months. Patient has been treating wound in the following manner: sutures. The wound has been healed in the past but has re-opened. Laboratory tests have not been performed in the last month. Patient reportedly has not tested positive for an antibiotic resistant organism. Patient reportedly has not  tested positive for osteomyelitis. Patient reportedly has not had testing performed to evaluate circulation in the legs. Patient History Information obtained from Patient. Allergies baclofen, Bactrim, NSAIDS (Non-Steroidal Anti-Inflammatory Drug) General Notes: wife states allergic to all muscle relaxers Family History Diabetes - Father, Heart Disease - Mother, Stroke - Mother,Siblings, No family history of Cancer, Hypertension, Kidney Disease, Lung Disease, Seizures. Social History Former smoker, Marital Status - Married, Alcohol Use - Never, Drug Use - No History, Caffeine Use - Daily. Medical And Surgical History Notes Constitutional Symptoms (General Health) Carotid stenosis; [name]; Mutiple Myeloma; Lymphoma; BPH Review of Systems (ROS) Eyes Complains or has symptoms of Dry Eyes, Glasses / Contacts. Denies complaints or symptoms of Vision Changes. Ear/Nose/Mouth/Throat Denies complaints or symptoms of Difficult clearing ears, Sinusitis. Hematologic/Lymphatic Denies complaints or symptoms of Bleeding / Clotting Disorders, Human Immunodeficiency Virus. Respiratory Denies complaints or symptoms of Chronic or frequent coughs, Shortness of Breath. Cardiovascular Denies complaints or symptoms of Chest pain, LE edema. Gastrointestinal Denies complaints or symptoms of Frequent diarrhea, Nausea, Vomiting. Endocrine [name], [name] ([id number]) Denies complaints or symptoms of Hepatitis, Thyroid disease, Polydypsia (Excessive Thirst). Genitourinary Denies complaints or symptoms of Kidney failure/ Dialysis, Incontinence/dribbling. Immunological Denies complaints or symptoms of Hives, Itching. Integumentary (Skin) Complains or has symptoms of Wounds, Bleeding or bruising tendency. Denies complaints or symptoms of Breakdown, Swelling. Musculoskeletal Denies complaints or symptoms of Muscle Pain, Muscle Weakness. Neurologic Denies complaints or symptoms of Numbness/parasthesias,  Focal/Weakness. Oncologic The patient has no complaints or symptoms. Objective Constitutional Sitting or standing Blood Pressure is within target range for patient.. Pulse regular and within target range for patient.Marland Kitchen Respirations regular, non-labored and within target range.. Temperature is normal and within the target range for the patient.Marland Kitchen appears in no distress. Vitals Time Taken: 9:38 AM, Height: 70 in, Source: Stated, Weight: 181 lbs, Source: Stated, BMI: 26, Temperature: 98 F, Pulse: 70 bpm, Respiratory Rate: 18 breaths/min, Blood Pressure: 120/59 mmHg. Respiratory Respiratory  effort is easy and symmetric bilaterally. Rate is normal at rest and on room air.Marland Kitchen Psychiatric Patient is bright and alert able to give his own history. General Notes: Wound exam; I removed 13 sutures from the scalp. Then using a #5 curet necrotic surface debris removed from the wound surface. There is still small open areas which appear superficial and healthy. Most of the rest of this wound has held together nicely. There is no evidence of surrounding infection Integumentary (Hair, Skin) Wound #9 status is Open. Original cause of wound was Trauma. The wound is located on the Head - Parietal. The wound measures 4.5cm length x 1.5cm width x 0.1cm depth; 5.301cm^2 area and 0.53cm^3 volume. There is no tunneling or undermining noted. There is a small amount of serous drainage noted. The wound margin is flat and intact. There is no granulation within the wound bed. There is a large (67-100%) amount of necrotic tissue within the wound bed including Eschar. The periwound skin appearance exhibited: Dry/Scaly. The periwound skin appearance did not exhibit: Callus, Crepitus, Excoriation, Induration, Rash, Scarring, Maceration, Atrophie Blanche, Cyanosis, Ecchymosis, Hemosiderin Staining, Mottled, Pallor, Rubor, Erythema. General Notes: 13 staples and 2 surtures in place at wound site. [place], Savior L.  ([id number]) Assessment Active Problems ICD-10 S01.01XD - Laceration without foreign body of scalp, subsequent encounter Procedures Wound #9 Pre-procedure diagnosis of Wound #9 is a Trauma, Other located on the Head - Parietal . There was a Non-Viable Tissue Open Wound/Selective [phone]) debridement with total area of 6.75 sq cm performed by Ricard Dillon, MD. with the following instrument(s): Curette to remove Viable and Non-Viable tissue/material including Eschar and Skin after achieving pain control using Other (lidocaine 4%). A time out was conducted at 10:05, prior to the start of the procedure. A Minimum amount of bleeding was controlled with Pressure. The procedure was tolerated well with a pain level of 2 throughout and a pain level of 0 following the procedure. Post Debridement Measurements: 4.5cm length x 1.5cm width x 0.1cm depth; 0.53cm^3 volume. Character of Wound/Ulcer Post Debridement is stable. Post procedure Diagnosis Wound #9: Same as Pre-Procedure Plan Wound Cleansing: Wound #9 Head - Parietal: Clean wound with Normal Saline. Anesthetic: Wound #9 Head - Parietal: Topical Lidocaine 4% cream applied to wound bed prior to debridement - in clinic Primary Wound Dressing: Wound #9 Head - Parietal: Silvercel Non-Adherent Secondary Dressing: Wound #9 Head - Parietal: Other - coverlet Dressing Change Frequency: Wound #9 Head - Parietal: Change dressing every other day. Follow-up Appointments: Wound #9 Head - Parietal: Return Appointment in 1 week. [name], [name] ([id number]) #1 bit of unusual history here in that the wound was sutured 14 days after the original stitches the past. Nevertheless this is done nicely almost all of this wound is closed. The sutures were removed and the wound surface cleaned up with a #5 curet. He still has several open areas along the line of this original large laceration however all of the areas appear clean and healthy. No  evidence of infection #2 will apply silver alginate to the area with a gauze covering. This should heal easily Electronic Signature(s) Signed: 01/12/2017 12:59:40 PM By: Gretta Cool, BSN, RN, CWS, Kim RN, BSN Signed: 01/13/2017 5:42:40 PM By: Linton Ham MD Previous Signature: 01/06/2017 8:07:00 AM Version By: Linton Ham MD Entered By: Gretta Cool, BSN, RN, CWS, Kim on 01/12/2017 12:59:40 [place], [place] ([id number]) -------------------------------------------------------------------------------- ROS/PFSH Details Patient Name: [name], [name] Date of Service: 01/05/2017 9:30 AM Medical Record Number: [id number] Patient Account  Number: [id number] Date of Birth/Sex: August 31, 1923 (83 y.o. Male) Treating RN: Roger Shelter Primary Care Provider: [employer], SYED Other Clinician: Referring Provider: [facility], SYED Treating Provider/Extender: Tito Dine in Treatment: 0 Information Obtained From Patient Wound History Do you currently have one or more open woundso Yes How many open wounds do you currently haveo 1 Approximately how long have you had your woundso 2 months How have you been treating your wound(s) until nowo sutures Has your wound(s) ever healed and then re-openedo Yes Have you had any lab work done in the past montho No Have you tested positive for an antibiotic resistant organism (MRSA, VRE)o No Have you tested positive for osteomyelitis (bone infection)o No Have you had any tests for circulation on your legso No Eyes Complaints and Symptoms: Positive for: Dry Eyes; Glasses / Contacts Negative for: Vision Changes Medical History: Positive for: Cataracts - removed Negative for: Glaucoma; Optic Neuritis Ear/Nose/Mouth/Throat Complaints and Symptoms: Negative for: Difficult clearing ears; Sinusitis Medical History: Positive for: Chronic sinus problems/congestion Negative for: Middle ear problems Hematologic/Lymphatic Complaints and Symptoms: Negative for: Bleeding /  Clotting Disorders; Human Immunodeficiency Virus Medical History: Negative for: Anemia; Hemophilia; Human Immunodeficiency Virus; Lymphedema; Sickle Cell Disease Respiratory Complaints and Symptoms: Negative for: Chronic or frequent coughs; Shortness of Breath Medical History: Positive for: Sleep Apnea Negative for: Aspiration; Asthma; Chronic Obstructive Pulmonary Disease (COPD); Pneumothorax; Tuberculosis Capp, Diontae L. ([id number]) Cardiovascular Complaints and Symptoms: Negative for: Chest pain; LE edema Medical History: Positive for: Coronary Artery Disease - stent 3 years ago Negative for: Angina; Arrhythmia; Congestive Heart Failure; Deep Vein Thrombosis; Hypertension; Hypotension; Myocardial Infarction; Peripheral Arterial Disease; Peripheral Venous Disease; Phlebitis; Vasculitis Gastrointestinal Complaints and Symptoms: Negative for: Frequent diarrhea; Nausea; Vomiting Medical History: Negative for: Cirrhosis ; Colitis; Crohnos; Hepatitis A; Hepatitis B; Hepatitis C Endocrine Complaints and Symptoms: Negative for: Hepatitis; Thyroid disease; Polydypsia (Excessive Thirst) Medical History: Negative for: Type I Diabetes; Type II Diabetes Genitourinary Complaints and Symptoms: Negative for: Kidney failure/ Dialysis; Incontinence/dribbling Medical History: Negative for: End Stage Renal Disease Immunological Complaints and Symptoms: Negative for: Hives; Itching Medical History: Negative for: Lupus Erythematosus; Raynaudos; Scleroderma Integumentary (Skin) Complaints and Symptoms: Positive for: Wounds; Bleeding or bruising tendency Negative for: Breakdown; Swelling Medical History: Negative for: History of Burn; History of pressure wounds Musculoskeletal Complaints and Symptoms: Negative for: Muscle Pain; Muscle Weakness Medical History: Positive for: Osteoarthritis - back Negative for: Gout; Rheumatoid Arthritis; Osteomyelitis Attwood, Tylor L.  ([id number]) Neurologic Complaints and Symptoms: Negative for: Numbness/parasthesias; Focal/Weakness Medical History: Negative for: Dementia; Neuropathy; Quadriplegia; Paraplegia; Seizure Disorder Constitutional Symptoms (General Health) Medical History: Past Medical History Notes: Carotid stenosis; [name]; Mutiple Myeloma; Lymphoma; BPH Oncologic Complaints and Symptoms: No Complaints or Symptoms Medical History: Positive for: Received Chemotherapy Negative for: Received Radiation Psychiatric Medical History: Negative for: Anorexia/bulimia; Confinement Anxiety HBO Extended History Items Eyes: Ear/Nose/Mouth/Throat: Cataracts Chronic sinus problems/congestion Immunizations Pneumococcal Vaccine: Received Pneumococcal Vaccination: Yes Tetanus Vaccine: Last tetanus shot: 10/05/2016 Implantable Devices Family and Social History Cancer: No; Diabetes: Yes - Father; Heart Disease: Yes - Mother; Hypertension: No; Kidney Disease: No; Lung Disease: No; Seizures: No; Stroke: Yes - Mother,Siblings; Former smoker; Marital Status - Married; Alcohol Use: Never; Drug Use: No History; Caffeine Use: Daily; Advanced Directives: Yes (Not Provided); Patient does not want information on Advanced Directives; Living Will: Yes (Not Provided) Electronic Signature(s) Signed: 01/05/2017 4:36:56 PM By: Roger Shelter Signed: 01/06/2017 8:07:00 AM By: Linton Ham MD Entered By: Roger Shelter on 01/05/2017 09:48:39 Belinsky, Cisco L. ([id number]) -------------------------------------------------------------------------------- SuperBill Details  Patient Name: Plant, Arling L. Date of Service: 01/05/2017 Medical Record Number: [id number] Patient Account Number: [id number] Date of Birth/Sex: 08-Aug-1923 (83 y.o. Male) Treating RN: Roger Shelter Primary Care Provider: [facility], SYED Other Clinician: Referring Provider: [facility], SYED Treating Provider/Extender: Ricard Dillon Weeks in Treatment: 0 Diagnosis  Coding ICD-10 Codes Code Description S01.01XD Laceration without foreign body of scalp, subsequent encounter Facility Procedures CPT4 Code: [id number] Description: [phone] - DEBRIDE WOUND 1ST 20 SQ CM OR < ICD-10 Diagnosis Description S01.01XD Laceration without foreign body of scalp, subsequent encou Modifier: nter Quantity: 1 Physician Procedures CPT4 Code: 3428768 Description: 11572 - WC PHYS DEBR WO ANESTH 20 SQ CM ICD-10 Diagnosis Description S01.01XD Laceration without foreign body of scalp, subsequent encou Modifier: nter Quantity: 1 Electronic Signature(s) Signed: 01/06/2017 8:07:00 AM By: Linton Ham MD Entered By: Linton Ham on 01/05/2017 10:19:11

## 2017-01-07 NOTE — Progress Notes (Signed)
COLLYN, RIBAS ([id number]) Visit Report for 01/05/2017 Allergy List Details Patient Name: [name], [name] Date of Service: 01/05/2017 9:30 AM Medical Record Number: [id number] Patient Account Number: [id number] Date of Birth/Sex: [birth year] (83 y.o. Male) Treating RN: Roger Shelter Primary Care Vinia Jemmott: [facility], SYED Other Clinician: Referring Neyah Ellerman: Hessie Knows Treating Elisah Parmer/Extender: Ricard Dillon Weeks in Treatment: 0 Allergies Active Allergies baclofen Bactrim NSAIDS (Non-Steroidal Anti-Inflammatory Drug) Allergy Notes wife states allergic to all muscle relaxers Electronic Signature(s) Signed: 01/05/2017 4:36:56 PM By: Roger Shelter Entered By: Roger Shelter on 01/05/2017 09:43:15 Bordwell, Tavarus L. ([id number]) -------------------------------------------------------------------------------- Arrival Information Details Patient Name: [name], [name] Date of Service: 01/05/2017 9:30 AM Medical Record Number: [id number] Patient Account Number: [id number] Date of Birth/Sex: 02-Dec-1923 (83 y.o. Male) Treating RN: Roger Shelter Primary Care Braheem Tomasik: [facility], SYED Other Clinician: Referring Stirling Orton: Hessie Knows Treating Legend Tumminello/Extender: Tito Dine in Treatment: 0 Visit Information Patient Arrived: Wheel Chair Arrival Time: 09:34 Accompanied By: wife Transfer Assistance: None Patient Identification Verified: Yes Secondary Verification Process Completed: Yes Patient Requires Transmission-Based No Precautions: Patient Has Alerts: No History Since Last Visit Any new allergies or adverse reactions: No Had a fall or experienced change in activities of daily living that may affect risk of falls: Yes Signs or symptoms of abuse/neglect since last visito No Hospitalized since last visit: Yes Electronic Signature(s) Signed: 01/05/2017 4:36:56 PM By: Roger Shelter Entered By: Roger Shelter on 01/05/2017 09:37:49 Galax, Bishoy L.  ([id number]) -------------------------------------------------------------------------------- Encounter Discharge Information Details Patient Name: [name], [name] Date of Service: 01/05/2017 9:30 AM Medical Record Number: [id number] Patient Account Number: [id number] Date of Birth/Sex: [birth year] (83 y.o. Male) Treating RN: Roger Shelter Primary Care Cason Dabney: [facility], SYED Other Clinician: Referring Marcellina Jonsson: Hessie Knows Treating Arslan Kier/Extender: Tito Dine in Treatment: 0 Encounter Discharge Information Items Discharge Pain Level: 0 Discharge Condition: Stable Ambulatory Status: Wheelchair Discharge Destination: Home Private Transportation: Auto Accompanied By: wife Schedule Follow-up Appointment: Yes Medication Reconciliation completed and provided No to Patient/Care Mirra Basilio: Clinical Summary of Care: Electronic Signature(s) Signed: 01/05/2017 4:36:56 PM By: Roger Shelter Entered By: Roger Shelter on 01/05/2017 10:21:49 Dunivan, Master L. ([id number]) -------------------------------------------------------------------------------- Lower Extremity Assessment Details Patient Name: [name], [name] Date of Service: 01/05/2017 9:30 AM Medical Record Number: [id number] Patient Account Number: [id number] Date of Birth/Sex: Dec 05, 1923 (83 y.o. Male) Treating RN: Roger Shelter Primary Care Jewel Mcafee: [facility], Dossie Der Other Clinician: Referring Genola Yuille: Hessie Knows Treating Cathaleen Korol/Extender: Ricard Dillon Weeks in Treatment: 0 Electronic Signature(s) Signed: 01/05/2017 4:36:56 PM By: Roger Shelter Entered By: Roger Shelter on 01/05/2017 09:42:03 Thursby, Orlandis L. ([id number]) -------------------------------------------------------------------------------- Multi Wound Chart Details Patient Name: [name], [name] Date of Service: 01/05/2017 9:30 AM Medical Record Number: [id number] Patient Account Number: [id number] Date of Birth/Sex: [birth year] (83 y.o.  Male) Treating RN: Roger Shelter Primary Care Grizelda Piscopo: [facility], SYED Other Clinician: Referring Morgaine Kimball: [facility], MICHAEL Treating Haidy Kackley/Extender: Ricard Dillon Weeks in Treatment: 0 Vital Signs Height(in): 70 Pulse(bpm): 70 Weight(lbs): 181 Blood Pressure(mmHg): 120/59 Body Mass Index(BMI): 26 Temperature(F): 98 Respiratory Rate 18 (breaths/min): Photos: [N/A:N/A] Wound Location: Head - Parietal N/A N/A Wounding Event: Trauma N/A N/A Primary Etiology: Trauma, Other N/A N/A Comorbid History: Cataracts, Chronic sinus N/A N/A problems/congestion, Sleep Apnea, Coronary Artery Disease, Osteoarthritis, Received Chemotherapy Date Acquired: 12/04/2016 N/A N/A Weeks of Treatment: 0 N/A N/A Wound Status: Open N/A N/A Clustered Wound: Yes N/A N/A Measurements L x W x D 4.5x1.5x0.1 N/A N/A (cm) Area (cm) : 5.301 N/A N/A Volume (cm) : 0.53 N/A N/A % Reduction in Area:  0.00% N/A N/A % Reduction in Volume: 0.00% N/A N/A Classification: Partial Thickness N/A N/A Exudate Amount: Small N/A N/A Exudate Type: Serous N/A N/A Exudate Color: amber N/A N/A Wound Margin: Flat and Intact N/A N/A Granulation Amount: None Present (0%) N/A N/A Necrotic Amount: Large (67-100%) N/A N/A Necrotic Tissue: Eschar N/A N/A Exposed Structures: Fascia: No N/A N/A Fat Layer (Subcutaneous Tissue) Exposed: No Tendon: No Muscle: No [name], [name] ([id number]) Joint: No Bone: No Debridement: Open Wound/Selective N/A N/A ([id number]) - Selective Pre-procedure 10:05 N/A N/A Verification/Time Out Taken: Pain Control: Other N/A N/A Tissue Debrided: Necrotic/Eschar, Skin N/A N/A Level: Non-Viable Tissue N/A N/A Debridement Area (sq cm): 6.75 N/A N/A Instrument: Curette N/A N/A Bleeding: Minimum N/A N/A Hemostasis Achieved: Pressure N/A N/A Procedural Pain: 2 N/A N/A Post Procedural Pain: 0 N/A N/A Debridement Treatment Procedure was tolerated well N/A N/A Response: Post Debridement  4.5x1.5x0.1 N/A N/A Measurements L x W x D (cm) Post Debridement Volume: 0.53 N/A N/A (cm) Periwound Skin Texture: Excoriation: No N/A N/A Induration: No Callus: No Crepitus: No Rash: No Scarring: No Periwound Skin Moisture: Dry/Scaly: Yes N/A N/A Maceration: No Periwound Skin Color: Atrophie Blanche: No N/A N/A Cyanosis: No Ecchymosis: No Erythema: No Hemosiderin Staining: No Mottled: No Pallor: No Rubor: No Tenderness on Palpation: No N/A N/A Wound Preparation: Ulcer Cleansing: N/A N/A Rinsed/Irrigated with Saline, Other: shaved area around woiund Topical Anesthetic Applied: Other: lidocaine 4 % Assessment Notes: 13 staples and 2 surtures in N/A N/A place at wound site. Procedures Performed: Debridement N/A N/A Treatment Notes Electronic Signature(s) Signed: 01/05/2017 4:36:56 PM By: Roger Shelter Entered By: Roger Shelter on 01/05/2017 10:11:21 Minkler, Aristide L. ([id number]) [place], Vasilis LMarland Kitchen ([id number]) -------------------------------------------------------------------------------- Multi-Disciplinary Care Plan Details Patient Name: Sohn, Cristal L. Date of Service: 01/05/2017 9:30 AM Medical Record Number: [id number] Patient Account Number: [id number] Date of Birth/Sex: 1923-03-30 (83 y.o. Male) Treating RN: Roger Shelter Primary Care Sheridyn Canino: [facility], SYED Other Clinician: Referring Maysen Bonsignore: Hessie Knows Treating Sundeep Cary/Extender: Tito Dine in Treatment: 0 Active Inactive ` Abuse / Safety / Falls / Self Care Management Nursing Diagnoses: History of Falls Impaired physical mobility Potential for falls Potential for injury related to falls Self care deficit: actual or potential Goals: Patient will remain injury free related to falls Date Initiated: 01/05/2017 Target Resolution Date: 02/04/2017 Goal Status: Active Patient/caregiver will verbalize understanding of skin care regimen Date Initiated: 01/05/2017 Target Resolution Date:  02/04/2017 Goal Status: Active Patient/caregiver will verbalize/demonstrate measure taken to improve self care Date Initiated: 01/05/2017 Target Resolution Date: 02/04/2017 Goal Status: Active Patient/caregiver will verbalize/demonstrate measures taken to improve the patient's personal safety Date Initiated: 01/05/2017 Target Resolution Date: 02/04/2017 Goal Status: Active Patient/caregiver will verbalize/demonstrate measures taken to prevent injury and/or falls Date Initiated: 01/05/2017 Target Resolution Date: 02/04/2017 Goal Status: Active Interventions: Assess fall risk on admission and as needed Assess self care needs on admission and as needed Provide education on fall prevention Notes: ` Orientation to the Wound Care Program Nursing Diagnoses: Knowledge deficit related to the wound healing center program Goals: Patient/caregiver will verbalize understanding of the [place], Serafin L. ([id number]) Date Initiated: 01/05/2017 Target Resolution Date: 02/04/2017 Goal Status: Active Interventions: Provide education on orientation to the wound center Notes: ` Wound/Skin Impairment Nursing Diagnoses: Impaired tissue integrity Goals: Patient/caregiver will verbalize understanding of skin care regimen Date Initiated: 01/05/2017 Target Resolution Date: 02/04/2017 Goal Status: Active Ulcer/skin breakdown will have a volume reduction of 30% by week 4 Date Initiated: 01/05/2017 Target Resolution  Date: 02/04/2017 Goal Status: Active Interventions: Assess patient/caregiver ability to perform ulcer/skin care regimen upon admission and as needed Assess ulceration(s) every visit Notes: Electronic Signature(s) Signed: 01/05/2017 4:36:56 PM By: Roger Shelter Entered By: Roger Shelter on 01/05/2017 10:00:23 Down, Jerrik L. ([id number]) -------------------------------------------------------------------------------- Pain Assessment Details Patient  Name: Lovecchio, Jorgeluis L. Date of Service: 01/05/2017 9:30 AM Medical Record Number: [id number] Patient Account Number: [id number] Date of Birth/Sex: 1923-09-04 (83 y.o. Male) Treating RN: Roger Shelter Primary Care Ghislaine Harcum: [facility], SYED Other Clinician: Referring Tylesha Gibeault: Hessie Knows Treating Damary Doland/Extender: Ricard Dillon Weeks in Treatment: 0 Active Problems Location of Pain Severity and Description of Pain Patient Has Paino No Site Locations Pain Management and Medication Current Pain Management: Electronic Signature(s) Signed: 01/05/2017 4:36:56 PM By: Roger Shelter Entered By: Roger Shelter on 01/05/2017 09:38:00 [place], [place]. ([id number]) -------------------------------------------------------------------------------- Patient/Caregiver Education Details Patient Name: [name], [name] Date of Service: 01/05/2017 9:30 AM Medical Record Number: [id number] Patient Account Number: [id number] Date of Birth/Gender: 09/05/1923 (83 y.o. Male) Treating RN: Roger Shelter Primary Care Physician: [facility], SYED Other Clinician: Referring Physician: Hessie Knows Treating Physician/Extender: Tito Dine in Treatment: 0 Education Assessment Education Provided To: Patient and Caregiver wife Education Topics Provided Welcome To The [place]: Handouts: Welcome To The [place] Methods: Explain/Verbal Responses: State content correctly Wound Debridement: Handouts: Wound Debridement Methods: Explain/Verbal Responses: State content correctly Wound/Skin Impairment: Handouts: Caring for Your Ulcer Methods: Explain/Verbal Responses: State content correctly Electronic Signature(s) Signed: 01/05/2017 4:36:56 PM By: Roger Shelter Entered By: Roger Shelter on 01/05/2017 10:22:46 [place], [place]. ([id number]) -------------------------------------------------------------------------------- Wound Assessment Details Patient Name: Dugal, Jaceyon L. Date of  Service: 01/05/2017 9:30 AM Medical Record Number: [id number] Patient Account Number: [id number] Date of Birth/Sex: 1923-05-27 (83 y.o. Male) Treating RN: Roger Shelter Primary Care Natori Gudino: [facility], SYED Other Clinician: Referring Moussa Wiegand: Hessie Knows Treating Eladia Frame/Extender: Ricard Dillon Weeks in Treatment: 0 Wound Status Wound Number: 9 Primary Trauma, Other Etiology: Wound Location: Head - Parietal Wound Open Wounding Event: Trauma Status: Date Acquired: 12/04/2016 Comorbid Cataracts, Chronic sinus problems/congestion, Weeks Of Treatment: 0 History: Sleep Apnea, Coronary Artery Disease, Clustered Wound: Yes Osteoarthritis, Received Chemotherapy Photos Wound Measurements Length: (cm) 4.5 Width: (cm) 1.5 Depth: (cm) 0.1 Area: (cm) 5.301 Volume: (cm) 0.53 % Reduction in Area: 0% % Reduction in Volume: 0% Tunneling: No Undermining: No Wound Description Classification: Partial Thickness Wound Margin: Flat and Intact Exudate Amount: Small Exudate Type: Serous Exudate Color: amber Foul Odor After Cleansing: No Slough/Fibrino No Wound Bed Granulation Amount: None Present (0%) Exposed Structure Necrotic Amount: Large (67-100%) Fascia Exposed: No Necrotic Quality: Eschar Fat Layer (Subcutaneous Tissue) Exposed: No Tendon Exposed: No Muscle Exposed: No Joint Exposed: No Bone Exposed: No Periwound Skin Texture Texture Color No Abnormalities Noted: No No Abnormalities Noted: No Callus: No Atrophie Blanche: No Piscitelli, Jayceion L. ([id number]) Crepitus: No Cyanosis: No Excoriation: No Ecchymosis: No Induration: No Erythema: No Rash: No Hemosiderin Staining: No Scarring: No Mottled: No Pallor: No Moisture Rubor: No No Abnormalities Noted: No Dry / Scaly: Yes Maceration: No Wound Preparation Ulcer Cleansing: Rinsed/Irrigated with Saline, Other: shaved area around woiund, Topical Anesthetic Applied: Other: lidocaine 4 %, Assessment Notes 13  staples and 2 surtures in place at wound site. Treatment Notes Wound #9 (Head - Parietal) 1. Cleansed with: Clean wound with Normal Saline 2. Anesthetic Topical Lidocaine 4% cream to wound bed prior to debridement 4. Dressing Applied: Other dressing (specify in notes) Notes silvercel and cover with coverlet bandage Electronic Signature(s) Signed:  01/05/2017 4:36:56 PM By: Roger Shelter Entered By: Roger Shelter on 01/05/2017 10:18:45 Petitjean, Jarrad L. ([id number]) -------------------------------------------------------------------------------- Vitals Details Patient Name: Rowton, Ivo L. Date of Service: 01/05/2017 9:30 AM Medical Record Number: [id number] Patient Account Number: [id number] Date of Birth/Sex: [birth year] (83 y.o. Male) Treating RN: Roger Shelter Primary Care Ayeza Therriault: [facility], SYED Other Clinician: Referring Sarabelle Genson: [facility], MICHAEL Treating Jaquila Santelli/Extender: Tito Dine in Treatment: 0 Vital Signs Time Taken: 09:38 Temperature (F): 98 Height (in): 70 Pulse (bpm): 70 Source: Stated Respiratory Rate (breaths/min): 18 Weight (lbs): 181 Blood Pressure (mmHg): 120/59 Source: Stated Reference Range: 80 - 120 mg / dl Body Mass Index (BMI): 26 Electronic Signature(s) Signed: 01/05/2017 4:36:56 PM By: Roger Shelter Entered By: Roger Shelter on 01/05/2017 09:38:58

## 2017-01-09 LAB — BLOOD GAS, ARTERIAL
Acid-base deficit: 0 mmol/L (ref 0.0–2.0)
Bicarbonate: 23.6 mmol/L (ref 20.0–28.0)
FIO2: 0.32
O2 Saturation: 79 %
Patient temperature: 37
pCO2 arterial: 34 mmHg (ref 32.0–48.0)
pH, Arterial: 7.45 (ref 7.350–7.450)
pO2, Arterial: 41 mmHg — ABNORMAL LOW (ref 83.0–108.0)

## 2017-01-11 ENCOUNTER — Telehealth: Payer: Self-pay | Admitting: Family Medicine

## 2017-01-11 ENCOUNTER — Other Ambulatory Visit: Payer: Self-pay | Admitting: Family Medicine

## 2017-01-11 DIAGNOSIS — F3342 Major depressive disorder, recurrent, in full remission: Secondary | ICD-10-CM

## 2017-01-11 MED ORDER — SERTRALINE HCL 100 MG PO TABS: 150.0000 mg | ORAL_TABLET | Freq: Every day | ORAL | 0 refills | Status: DC

## 2017-01-11 NOTE — Telephone Encounter (Signed)
Copied from [place]. Topic: Quick Communication - See Telephone Encounter >> Jan 11, 2017  9:46 AM Synthia Innocent wrote: CRM for notification. See Telephone encounter for:  Needing verbal orders for OT, 2w3 01/11/17.

## 2017-01-11 NOTE — Telephone Encounter (Signed)
Copied from [place]. Topic: General - Other >> Jan 11, 2017  3:20 PM Neva Seat wrote: Pt's wife called - wants refills on [facility] on [address]

## 2017-01-11 NOTE — Telephone Encounter (Signed)
Called "[name]" back - OT . States she performed her evaluation and is requesting an order for OT 2 x week x 3 weeks.

## 2017-01-12 ENCOUNTER — Encounter: Payer: Medicare Other | Admitting: Internal Medicine

## 2017-01-12 DIAGNOSIS — S0101XD Laceration without foreign body of scalp, subsequent encounter: Secondary | ICD-10-CM | POA: Diagnosis not present

## 2017-01-12 NOTE — Telephone Encounter (Signed)
[name] has been notified

## 2017-01-12 NOTE — Telephone Encounter (Signed)
Spoke to [name] and she states the pt depends on his wife to help with dressing, bathing, using the bathroom and bed mobility. [name] goal is to help the pt regain his independence so he can have lot more mobility.

## 2017-01-12 NOTE — Telephone Encounter (Signed)
Agree with the verbal order for occupational therapy as described

## 2017-01-12 NOTE — Telephone Encounter (Signed)
[name] called from OT needing the verbal orders today please. She is going to see the patient today & needs this asap. Thanks. [phone].

## 2017-01-13 ENCOUNTER — Other Ambulatory Visit: Payer: Self-pay | Admitting: *Deleted

## 2017-01-13 ENCOUNTER — Telehealth: Payer: Self-pay

## 2017-01-13 ENCOUNTER — Telehealth: Payer: Self-pay | Admitting: Family Medicine

## 2017-01-13 NOTE — Patient Outreach (Signed)
[facility]) Care Management  01/13/2017  [name] [birth year] [id number]  Telephone Screen  Referral Date:  12/22/2016 Referral Source:  [facility] ED Census Reason for Referral:  "6 or more ED visits in the past 6 months" Insurance:  Medicare   Outreach Attempt: Outreach attempt #1 to patient to complete telephone screening. No answer. RN Health Coach left HIPAA compliant voicemail message along with contact information.  Received call back immediately from [name],  transferred from [place].  [name] stating I can not speak with [name] because "he is hard of hearing".  [name] was calling back to confirm his appointment with Dr. Manuella Ghazi on tomorrow.  Explained to Mrs. Much this was a separate office but I could see he does have an appointment with Dr. Manuella Ghazi on November 29 at 1020 am.  Attempted to explain the reason for my call and discuss [facility] services, Mrs. Debold stated she was only concerned with his appointment tomorrow and ended the call.  I outreached to Dr. Trena Platt office and spoke with Malachy Mood to send message to MD or nurse to discuss [facility] services with patient and wife during their appointment on 11/29 if MD felt [name] would be beneficial to patient.  Plan:  RN Health Coach to make another telephone outreach to patient within the next 3 business days.   [place] [phone] [email or web address]

## 2017-01-13 NOTE — Telephone Encounter (Signed)
Per our conversation you asked me to send this to your attention. Pt wife came by office and said that [name] had oxygen in the house and that she needed the Dr to write or call and let them know that he can have the oxygen removed ( the oxygen would have to be removed by tomorrow 01-14-17) for they are coming to her appt tomorrow and do something that will cause the heat to be 104 in the appt and if not removed then it would blow up. Please call ASAP for the patients wife is just so upset that this will not get done.

## 2017-01-13 NOTE — Progress Notes (Signed)
Brandon, [name] ([id number]) Visit Report for 01/12/2017 Arrival Information Details Patient Name: [name], [name] Date of Service: 01/12/2017 11:00 AM Medical Record Number: [id number] Patient Account Number: [id number] Date of Birth/Sex: 1923-09-21 (83 y.o. Male) Treating RN: Roger Shelter Primary Care Broly Hatfield: [facility], [facility] Other Clinician: Referring Ji Fairburn: [facility], [facility] Treating Taria Castrillo/Extender: Tito Dine in Treatment: 1 Visit Information History Since Last Visit All ordered tests and consults were completed: No Patient Arrived: Wheel Chair Added or deleted any medications: No Arrival Time: 11:05 Any new allergies or adverse reactions: No Accompanied By: wife Had a fall or experienced change in No activities of daily living that may affect Transfer Assistance: None risk of falls: Patient Requires Transmission-Based No Signs or symptoms of abuse/neglect since last visito No Precautions: Hospitalized since last visit: No Patient Has Alerts: No Pain Present Now: No Electronic Signature(s) Signed: 01/12/2017 4:40:58 PM By: Roger Shelter Entered By: Roger Shelter on 01/12/2017 11:07:21 [place], Lael L. ([id number]) -------------------------------------------------------------------------------- Encounter Discharge Information Details Patient Name: [name], [name] Date of Service: 01/12/2017 11:00 AM Medical Record Number: [id number] Patient Account Number: [id number] Date of Birth/Sex: February 25, 1923 (83 y.o. Male) Treating RN: Roger Shelter Primary Care Deadra Diggins: [facility], [facility] Other Clinician: Referring Paulette Lynch: [facility], [facility] Treating Barbarajean Kinzler/Extender: Tito Dine in Treatment: 1 Encounter Discharge Information Items Discharge Pain Level: 0 Discharge Condition: Stable Ambulatory Status: Wheelchair Discharge Destination: Home Private Transportation: Auto Accompanied By: wife Schedule Follow-up Appointment: Yes Medication Reconciliation completed  and provided No to Patient/Care Brandon Levandowski: Clinical Summary of Care: Electronic Signature(s) Signed: 01/12/2017 4:40:58 PM By: Roger Shelter Entered By: Roger Shelter on 01/12/2017 11:37:53 Hendriks, Dayn L. ([id number]) -------------------------------------------------------------------------------- Lower Extremity Assessment Details Patient Name: [name], [name] Date of Service: 01/12/2017 11:00 AM Medical Record Number: [id number] Patient Account Number: [id number] Date of Birth/Sex: [birth year] (83 y.o. Male) Treating RN: Roger Shelter Primary Care Demarian Epps: [facility], Dossie Der Other Clinician: Referring Tessica Cupo: [facility], [facility] Treating Makylie Rivere/Extender: Ricard Dillon Weeks in Treatment: 1 Electronic Signature(s) Signed: 01/12/2017 4:40:58 PM By: Roger Shelter Entered By: Roger Shelter on 01/12/2017 11:16:55 Sukhu, Trustin L. ([id number]) -------------------------------------------------------------------------------- Multi Wound Chart Details Patient Name: [name], [name] Date of Service: 01/12/2017 11:00 AM Medical Record Number: [id number] Patient Account Number: [id number] Date of Birth/Sex: March 21, 1923 (83 y.o. Male) Treating RN: Roger Shelter Primary Care Izear Pine: [facility], [facility] Other Clinician: Referring Aleaha Fickling: [facility], [facility]ED Treating Aleta Manternach/Extender: Tito Dine in Treatment: 1 Vital Signs Height(in): 70 Pulse(bpm): 64 Weight(lbs): 181 Blood Pressure(mmHg): 110/44 Body Mass Index(BMI): 26 Temperature(F): 97.6 Respiratory Rate 18 (breaths/min): Photos: [N/A:N/A] Wound Location: Head - Parietal N/A N/A Wounding Event: Trauma N/A N/A Primary Etiology: Trauma, Other N/A N/A Comorbid History: Cataracts, Chronic sinus N/A N/A problems/congestion, Sleep Apnea, Coronary Artery Disease, Osteoarthritis, Received Chemotherapy Date Acquired: 12/04/2016 N/A N/A Weeks of Treatment: 1 N/A N/A Wound Status: Open N/A N/A Clustered Wound: Yes N/A  N/A Measurements L x W x D 3.5x1.1x0.1 N/A N/A (cm) Area (cm) : 3.024 N/A N/A Volume (cm) : 0.302 N/A N/A % Reduction in Area: 43.00% N/A N/A % Reduction in Volume: 43.00% N/A N/A Classification: Partial Thickness N/A N/A Exudate Amount: None Present N/A N/A Wound Margin: Flat and Intact N/A N/A Granulation Amount: None Present (0%) N/A N/A Necrotic Amount: Large (67-100%) N/A N/A Necrotic Tissue: Eschar N/A N/A Exposed Structures: Fascia: No N/A N/A Fat Layer (Subcutaneous Tissue) Exposed: No Tendon: No Muscle: No Joint: No Bone: No Campa, Slayter L. ([id number]) Epithelialization: Large (67-100%) N/A N/A Debridement: Debridement ([id number]) N/A N/A Pre-procedure 11:26 N/A  N/A Verification/Time Out Taken: Pain Control: Other N/A N/A Tissue Debrided: Necrotic/Eschar, Skin, N/A N/A Subcutaneous Level: Skin/Subcutaneous Tissue N/A N/A Debridement Area (sq cm): 1 N/A N/A Instrument: Curette N/A N/A Bleeding: Minimum N/A N/A Hemostasis Achieved: Pressure N/A N/A Procedural Pain: 0 N/A N/A Post Procedural Pain: 0 N/A N/A Debridement Treatment Procedure was tolerated well N/A N/A Response: Post Debridement 3.5x1.1x0.1 N/A N/A Measurements L x W x D (cm) Post Debridement Volume: 0.302 N/A N/A (cm) Periwound Skin Texture: Excoriation: No N/A N/A Induration: No Callus: No Crepitus: No Rash: No Scarring: No Periwound Skin Moisture: Dry/Scaly: Yes N/A N/A Maceration: No Periwound Skin Color: Atrophie Blanche: No N/A N/A Cyanosis: No Ecchymosis: No Erythema: No Hemosiderin Staining: No Mottled: No Pallor: No Rubor: No Tenderness on Palpation: No N/A N/A Wound Preparation: Ulcer Cleansing: N/A N/A Rinsed/Irrigated with Saline, Other: shaved area around woiund Topical Anesthetic Applied: Other: lidocaine 4 % Procedures Performed: Debridement N/A N/A Treatment Notes Wound #9 (Head - Parietal) 1. Cleansed with: Clean wound with Normal Saline 2.  Anesthetic Topical Lidocaine 4% cream to wound bed prior to debridement 4. Dressing Applied: Other dressing (specify in notes) Notes Mathia, Vikash L. ([id number]) silvercel and cover with coverlet bandage Electronic Signature(s) Signed: 01/12/2017 4:33:39 PM By: Linton Ham MD Entered By: Linton Ham on 01/12/2017 12:18:17 Alba, Adir L. ([id number]) -------------------------------------------------------------------------------- Multi-Disciplinary Care Plan Details Patient Name: Skipper, Rainer L. Date of Service: 01/12/2017 11:00 AM Medical Record Number: [id number] Patient Account Number: [id number] Date of Birth/Sex: 04-Sep-1923 (83 y.o. Male) Treating RN: Roger Shelter Primary Care Kalyb Pemble: [facility], [facility] Other Clinician: Referring Joley Utecht: [facility], [facility] Treating Sparkles Mcneely/Extender: Tito Dine in Treatment: 1 Active Inactive ` Abuse / Safety / Falls / Self Care Management Nursing Diagnoses: History of Falls Impaired physical mobility Potential for falls Potential for injury related to falls Self care deficit: actual or potential Goals: Patient will remain injury free related to falls Date Initiated: 01/05/2017 Target Resolution Date: 02/04/2017 Goal Status: Active Patient/caregiver will verbalize understanding of skin care regimen Date Initiated: 01/05/2017 Target Resolution Date: 02/04/2017 Goal Status: Active Patient/caregiver will verbalize/demonstrate measure taken to improve self care Date Initiated: 01/05/2017 Target Resolution Date: 02/04/2017 Goal Status: Active Patient/caregiver will verbalize/demonstrate measures taken to improve the patient's personal safety Date Initiated: 01/05/2017 Target Resolution Date: 02/04/2017 Goal Status: Active Patient/caregiver will verbalize/demonstrate measures taken to prevent injury and/or falls Date Initiated: 01/05/2017 Target Resolution Date: 02/04/2017 Goal Status: Active Interventions: Assess fall risk on  admission and as needed Assess self care needs on admission and as needed Provide education on fall prevention Notes: ` Orientation to the Wound Care Program Nursing Diagnoses: Knowledge deficit related to the wound healing center program Goals: Patient/caregiver will verbalize understanding of the [place], Darrold L. ([id number]) Date Initiated: 01/05/2017 Target Resolution Date: 02/04/2017 Goal Status: Active Interventions: Provide education on orientation to the wound center Notes: ` Wound/Skin Impairment Nursing Diagnoses: Impaired tissue integrity Goals: Patient/caregiver will verbalize understanding of skin care regimen Date Initiated: 01/05/2017 Target Resolution Date: 02/04/2017 Goal Status: Active Ulcer/skin breakdown will have a volume reduction of 30% by week 4 Date Initiated: 01/05/2017 Target Resolution Date: 02/04/2017 Goal Status: Active Interventions: Assess patient/caregiver ability to perform ulcer/skin care regimen upon admission and as needed Assess ulceration(s) every visit Notes: Electronic Signature(s) Signed: 01/12/2017 4:40:58 PM By: Roger Shelter Entered By: Roger Shelter on 01/12/2017 11:17:01 Sundt, Williams L. ([id number]) -------------------------------------------------------------------------------- Pain Assessment Details Patient Name: Linney, Kelvon L. Date of Service: 01/12/2017 11:00 AM Medical Record Number:  [id number] Patient Account Number: [id number] Date of Birth/Sex: April 04, 1923 (83 y.o. Male) Treating RN: Roger Shelter Primary Care Sua Spadafora: [facility], [facility] Other Clinician: Referring Henny Strauch: [facility], [facility]ED Treating Azhane Eckart/Extender: Ricard Dillon Weeks in Treatment: 1 Active Problems Location of Pain Severity and Description of Pain Patient Has Paino No Site Locations Pain Management and Medication Current Pain Management: Electronic Signature(s) Signed: 01/12/2017 4:40:58 PM By: Roger Shelter Entered By: Roger Shelter on 01/12/2017 11:09:01 Throgmorton, Caleb L. ([id number]) -------------------------------------------------------------------------------- Patient/Caregiver Education Details Patient Name: Knights, Zeb L. Date of Service: 01/12/2017 11:00 AM Medical Record Number: [id number] Patient Account Number: [id number] Date of Birth/Gender: [birth year] (83 y.o. Male) Treating RN: Roger Shelter Primary Care Physician: [facility], [facility] Other Clinician: Referring Physician: [facility], [facility] Treating Physician/Extender: Tito Dine in Treatment: 1 Education Assessment Education Provided To: Patient and Caregiver Education Topics Provided Wound Debridement: Handouts: Wound Debridement Methods: Explain/Verbal Responses: State content correctly Wound/Skin Impairment: Handouts: Caring for Your Ulcer Methods: Explain/Verbal Responses: State content correctly Electronic Signature(s) Signed: 01/12/2017 4:40:58 PM By: Roger Shelter Entered By: Roger Shelter on 01/12/2017 11:38:42 [place], Ryosuke L. ([id number]) -------------------------------------------------------------------------------- Wound Assessment Details Patient Name: Sullenger, Ricke L. Date of Service: 01/12/2017 11:00 AM Medical Record Number: [id number] Patient Account Number: [id number] Date of Birth/Sex: [birth year] (83 y.o. Male) Treating RN: Roger Shelter Primary Care Brandee Markin: [facility], [facility] Other Clinician: Referring Calley Drenning: [facility], [facility]ED Treating Kasy Iannacone/Extender: Ricard Dillon Weeks in Treatment: 1 Wound Status Wound Number: 9 Primary Trauma, Other Etiology: Wound Location: Head - Parietal Wound Open Wounding Event: Trauma Status: Date Acquired: 12/04/2016 Comorbid Cataracts, Chronic sinus problems/congestion, Weeks Of Treatment: 1 History: Sleep Apnea, Coronary Artery Disease, Clustered Wound: Yes Osteoarthritis, Received Chemotherapy Photos Wound Measurements Length: (cm) 3.5 Width:  (cm) 1.1 Depth: (cm) 0.1 Area: (cm) 3.024 Volume: (cm) 0.302 % Reduction in Area: 43% % Reduction in Volume: 43% Epithelialization: Large (67-100%) Tunneling: No Undermining: No Wound Description Classification: Partial Thickness Foul Od Wound Margin: Flat and Intact Slough/ Exudate Amount: None Present or After Cleansing: No Fibrino No Wound Bed Granulation Amount: None Present (0%) Exposed Structure Necrotic Amount: Large (67-100%) Fascia Exposed: No Necrotic Quality: Eschar Fat Layer (Subcutaneous Tissue) Exposed: No Tendon Exposed: No Muscle Exposed: No Joint Exposed: No Bone Exposed: No Periwound Skin Texture Texture Color No Abnormalities Noted: No No Abnormalities Noted: No Callus: No Atrophie Blanche: No Crepitus: No Cyanosis: No Excoriation: No Ecchymosis: No Murin, Trejan L. ([id number]) Induration: No Erythema: No Rash: No Hemosiderin Staining: No Scarring: No Mottled: No Pallor: No Moisture Rubor: No No Abnormalities Noted: No Dry / Scaly: Yes Maceration: No Wound Preparation Ulcer Cleansing: Rinsed/Irrigated with Saline, Other: shaved area around woiund, Topical Anesthetic Applied: Other: lidocaine 4 %, Treatment Notes Wound #9 (Head - Parietal) 1. Cleansed with: Clean wound with Normal Saline 2. Anesthetic Topical Lidocaine 4% cream to wound bed prior to debridement 4. Dressing Applied: Other dressing (specify in notes) Notes silvercel and cover with coverlet bandage Electronic Signature(s) Signed: 01/12/2017 4:40:58 PM By: Roger Shelter Entered By: Roger Shelter on 01/12/2017 11:39:28 [place], [place] ([id number]) -------------------------------------------------------------------------------- Vitals Details Patient Name: Tony, Jentzen L. Date of Service: 01/12/2017 11:00 AM Medical Record Number: [id number] Patient Account Number: [id number] Date of Birth/Sex: [birth year] (83 y.o. Male) Treating RN: Roger Shelter Primary Care  Shanna Un: [facility], [facility] Other Clinician: Referring Denajah Farias: [facility], [facility]ED Treating Nusaiba Guallpa/Extender: Tito Dine in Treatment: 1 Vital Signs Time Taken: 11:09 Temperature (F): 97.6 Height (in): 70 Pulse (bpm): 64 Weight (lbs): 181 Respiratory Rate (breaths/min): 18 Body Mass Index (  BMI): 26 Blood Pressure (mmHg): 110/44 Reference Range: 80 - 120 mg / dl Electronic Signature(s) Signed: 01/12/2017 4:40:58 PM By: Roger Shelter Entered By: Roger Shelter on 01/12/2017 11:13:47

## 2017-01-13 NOTE — Progress Notes (Addendum)
KAELEM, BRACH ([id number]) Visit Report for 01/12/2017 Debridement Details Patient Name: [name], [name] Date of Service: 01/12/2017 11:00 AM Medical Record Number: [id number] Patient Account Number: [id number] Date of Birth/Sex: 12/27/1923 (83 y.o. Male) Treating RN: Cornell Barman Primary Care Provider: [facility], [facility] Other Clinician: Referring Provider: [facility], [facility] Treating Provider/Extender: Tito Dine in Treatment: 1 Debridement Performed for Wound #9 Head - Parietal Assessment: Performed By: Physician Ricard Dillon, MD Debridement: Open Wound/Selective Debridement Description: Selective Pre-procedure Verification/Time Yes - 11:26 Out Taken: Start Time: 11:26 Pain Control: Other : lidocaine 4% Level: Skin/Dermis Total Area Debrided (L x W): 1 (cm) x 1 (cm) = 1 (cm) Tissue and other material Viable, Non-Viable, Eschar, Skin debrided: Instrument: Curette Bleeding: Minimum Hemostasis Achieved: Pressure End Time: 11:28 Procedural Pain: 0 Post Procedural Pain: 0 Response to Treatment: Procedure was tolerated well Post Debridement Measurements of Total Wound Length: (cm) 3.5 Width: (cm) 1.1 Depth: (cm) 0.1 Volume: (cm) 0.302 Character of Wound/Ulcer Post Debridement: Stable Post Procedure Diagnosis Same as Pre-procedure Electronic Signature(s) Signed: 01/21/2017 9:19:40 AM By: Gretta Cool, BSN, RN, CWS, Kim RN, BSN Signed: 01/26/2017 2:49:40 PM By: Linton Ham MD Previous Signature: 01/18/2017 10:39:04 AM Version By: Gretta Cool BSN, RN, CWS, Kim RN, BSN Previous Signature: 01/12/2017 4:33:39 PM Version By: Linton Ham MD Previous Signature: 01/12/2017 4:40:58 PM Version By: Roger Shelter Entered By: Gretta Cool BSN, RN, CWS, Kim on 01/21/2017 09:19:40 [place], Kevyn L. ([id number]) -------------------------------------------------------------------------------- HPI Details Patient Name: [name], [name] Date of Service: 01/12/2017 11:00 AM Medical Record Number:  [id number] Patient Account Number: [id number] Date of Birth/Sex: [birth year] (83 y.o. Male) Treating RN: Roger Shelter Primary Care Provider: [facility], [facility] Other Clinician: Referring Provider: [facility], [facility] Treating Provider/Extender: Ricard Dillon Weeks in Treatment: 1 History of Present Illness Location: right elbow, left knee, left upper arm and left upper back Quality: Patient reports experiencing a dull pain to affected area(s). Severity: Patient states wound (s) are getting better. Duration: Patient has had the wound for < 1 weeks prior to presenting for treatment Timing: Pain in wound is Intermittent (comes and goes Context: The wound occurred when the patient had a fall and hurt his back Modifying Factors: Other treatment(s) tried include:local care with Neosporin ointment Associated Signs and Symptoms: Patient reports having:scar tissue opens out every time they do dressing HPI Description: 83 year old patient who was in [place] in May a couple of times for a lacerated wound which was healed over a couple of weeks. He now comes with recent injuries to his back and knees and both arms sustained after a fall. He did not seek any medical help and they use dressing material which was previously there. Past medical history significant for carotid stenosis, [name], [name], dyslipidemia, hypertension, anxiety, GERD, multiple myeloma, BPH, inguinal hernia and is status post appendectomy, hip fracture, cardiac catheterization. He smoked in the remote past and has not smoked for several years. He has had long-term low back pain and was evaluated for pain radiating to the left groin. 02-06-16 Mr. Slomski progress for evaluation of multiple skin tears, he is accompanied by his wife. Neither patient nor his wife expressed the concerns regarding his wounds and/or the treatment plan. They deny any falls since his last appointment or any new injuries. READMISSION this is a 83 year old man who is been  seen in the clinic in 2017 for multiple skin tears after falling. These were fairly easy to close over unfortunately the patient's recent course is not gone well. He fell and suffered  a right hip fracture at the beginning of March. He was discharged to a skilled facility and sometime during the stay at the skilled facility his daughter noted a bandage on his left elbow. It wasn't told later that they learned he had fallen out of bed sustaining an injury. After discharge a week ago he apparently had a off-balance issue falling against the wall and hit the left elbow again. Been using peroxide and Neosporin. Seen by the family doctor yesterday and x-ray of the left elbow was negative. He was put on doxycycline. The patient has a history of coronary artery disease status post stent, [name], hypertension, lumbar spinal stenosis, multiple myeloma and BPH. He is apparently had bilateral hip replacements secondary to falling with the most recent one being on the right. He states that on the times he has fallen he has got up to go to the bathroom and becomes "swimmy headed" although he does not describe the off-balance sensation were reproducibly with changes in position 06/10/16; left elbow wounds look improved this is on the posterior aspect over the olecranon area. Healthy-looking granulation. The Prisma apparently stuck to the wound bed at least as being applied by home health. We changed to [facility]. 06/17/16; the areas over the left elbow look improved. There are 2 small open areas. Light surface debridement to remove surface eschar. 06/24/16; left elbow as predicted last week has epithelialized as predicted HOWEVER today he arrives with blisters in a perfect distribution limited to the wound. 07/01/16; the entire area of his elbow looks better this week. Only one open area remains 07/08/16; the open areas around his left elbow look better. This is a frail man with a history of falls. He has  multiple myeloma by history and lumbar spinal stenosis. He uses a walker. He has actinic keratosis, actinic purpura and xerotic skin. He is severely damaged skin on his forearms READMISSION [place], Kennett L. ([id number]) 01/05/17; this is a elderly man that we've had in clinic multiple times most recently in the earlier part of this year for a wound on the left and olecranon area which was traumatic. His current problem started on 12/05/16 when he sustained a fall with a large scalp laceration over the occiput. This was repaired with several staples and stitches. There was bleeding noted on 11/2 from the wound which was not observed in the ER and he was sent home. His sutures were removed on 12/20/16 however he was seen again on 12/21/16 in the ER with bleeding from the wound. At that time there was extensive debridement of granulation tissue and redundant skin flap was performed to approximate skin borders. The wound was stapled. He was referred back to his primary doctor for removal of the staples however the primary doctor sent him here. In the meantime he has undergone a kyphoplasty at 12/30/16 of T10 looking through [place] 01/12/17; patient with a large laceration on the occiput of his scalp. I removed his stitches last week and there was still open areas remaining. We've been using silver alginate. He is doing Pensions consultant) Signed: 01/12/2017 4:33:39 PM By: Linton Ham MD Entered By: Linton Ham on 01/12/2017 12:19:13 [place], Jantzen L. ([id number]) -------------------------------------------------------------------------------- Physical Exam Details Patient Name: Staffa, Girard L. Date of Service: 01/12/2017 11:00 AM Medical Record Number: [id number] Patient Account Number: [id number] Date of Birth/Sex: 24-Jul-1923 (83 y.o. Male) Treating RN: Roger Shelter Primary Care Provider: [facility], [place] Other Clinician: Referring Provider: [facility], [facility] Treating Provider/Extender: Tito Dine  in Treatment: 1 Notes wound exam; using a #3 curet I remove necrotic surface debris from the wound. There is only 2 small open areas. He also has what looks to be a residual hematoma over this area in close proximity to the wound. This is nontender Electronic Signature(s) Signed: 01/12/2017 4:33:39 PM By: Linton Ham MD Entered By: Linton Ham on 01/12/2017 12:20:06 [place], [place] ([id number]) -------------------------------------------------------------------------------- Physician Orders Details Patient Name: Zwiebel, Octavius L. Date of Service: 01/12/2017 11:00 AM Medical Record Number: [id number] Patient Account Number: [id number] Date of Birth/Sex: [birth year] (83 y.o. Male) Treating RN: Roger Shelter Primary Care Provider: [facility], [facility] Other Clinician: Referring Provider: [facility], [facility] Treating Provider/Extender: Tito Dine in Treatment: 1 Verbal / Phone Orders: No Diagnosis Coding Anesthetic Wound #9 Head - Parietal o Topical Lidocaine 4% cream applied to wound bed prior to debridement - in clinic Primary Wound Dressing Wound #9 Head - Parietal o Silvercel Non-Adherent Secondary Dressing Wound #9 Head - Parietal o Other - coverlet Dressing Change Frequency Wound #9 Head - Parietal o Change dressing every other day. Follow-up Appointments Wound #9 Head - Parietal o Return Appointment in 1 week. Electronic Signature(s) Signed: 01/12/2017 4:33:39 PM By: Linton Ham MD Signed: 01/12/2017 4:40:58 PM By: Roger Shelter Entered By: Roger Shelter on 01/12/2017 11:36:09 [place], Caldwell L. ([id number]) -------------------------------------------------------------------------------- Problem List Details Patient Name: Iovino, Martez L. Date of Service: 01/12/2017 11:00 AM Medical Record Number: [id number] Patient Account Number: [id number] Date of Birth/Sex: July 22, 1923 (83 y.o. Male) Treating RN: Roger Shelter Primary Care Provider:  [facility], [facility] Other Clinician: Referring Provider: [facility], [facility] Treating Provider/Extender: Tito Dine in Treatment: 1 Active Problems ICD-10 Encounter Code Description Active Date Diagnosis S01.01XD Laceration without foreign body of scalp, subsequent encounter 01/05/2017 Yes Inactive Problems Resolved Problems Electronic Signature(s) Signed: 01/12/2017 4:33:39 PM By: Linton Ham MD Entered By: Linton Ham on 01/12/2017 12:18:07 [place], [place] ([id number]) -------------------------------------------------------------------------------- Progress Note Details Patient Name: Haff, Matas L. Date of Service: 01/12/2017 11:00 AM Medical Record Number: [id number] Patient Account Number: [id number] Date of Birth/Sex: 10-Nov-1923 (83 y.o. Male) Treating RN: Roger Shelter Primary Care Provider: [facility], [facility] Other Clinician: Referring Provider: [facility], [facility] Treating Provider/Extender: Ricard Dillon Weeks in Treatment: 1 Subjective History of Present Illness (HPI) The following HPI elements were documented for the patient's wound: Location: right elbow, left knee, left upper arm and left upper back Quality: Patient reports experiencing a dull pain to affected area(s). Severity: Patient states wound (s) are getting better. Duration: Patient has had the wound for < 1 weeks prior to presenting for treatment Timing: Pain in wound is Intermittent (comes and goes Context: The wound occurred when the patient had a fall and hurt his back Modifying Factors: Other treatment(s) tried include:local care with Neosporin ointment Associated Signs and Symptoms: Patient reports having:scar tissue opens out every time they do dressing 83 year old patient who was in [place] in May a couple of times for a lacerated wound which was healed over a couple of weeks. He now comes with recent injuries to his back and knees and both arms sustained after a fall. He did not seek any medical help and they  use dressing material which was previously there. Past medical history significant for carotid stenosis, [name], [name], dyslipidemia, hypertension, anxiety, GERD, multiple myeloma, BPH, inguinal hernia and is status post appendectomy, hip fracture, cardiac catheterization. He smoked in the remote past and has not smoked for several years. He has had long-term low back pain and was evaluated  for pain radiating to the left groin. 02-06-16 Mr. Schoenfelder progress for evaluation of multiple skin tears, he is accompanied by his wife. Neither patient nor his wife expressed the concerns regarding his wounds and/or the treatment plan. They deny any falls since his last appointment or any new injuries. READMISSION this is a 83 year old man who is been seen in the clinic in 2017 for multiple skin tears after falling. These were fairly easy to close over unfortunately the patient's recent course is not gone well. He fell and suffered a right hip fracture at the beginning of March. He was discharged to a skilled facility and sometime during the stay at the skilled facility his daughter noted a bandage on his left elbow. It wasn't told later that they learned he had fallen out of bed sustaining an injury. After discharge a week ago he apparently had a off-balance issue falling against the wall and hit the left elbow again. Been using peroxide and Neosporin. Seen by the family doctor yesterday and x-ray of the left elbow was negative. He was put on doxycycline. The patient has a history of coronary artery disease status post stent, [name], hypertension, lumbar spinal stenosis, multiple myeloma and BPH. He is apparently had bilateral hip replacements secondary to falling with the most recent one being on the right. He states that on the times he has fallen he has got up to go to the bathroom and becomes "swimmy headed" although he does not describe the off-balance sensation were reproducibly with changes in  position 06/10/16; left elbow wounds look improved this is on the posterior aspect over the olecranon area. Healthy-looking granulation. The Prisma apparently stuck to the wound bed at least as being applied by home health. We changed to [facility]. 06/17/16; the areas over the left elbow look improved. There are 2 small open areas. Light surface debridement to remove surface eschar. 06/24/16; left elbow as predicted last week has epithelialized as predicted HOWEVER today he arrives with blisters in a perfect distribution limited to the wound. 07/01/16; the entire area of his elbow looks better this week. Only one open area remains 07/08/16; the open areas around his left elbow look better. This is a frail man with a history of falls. He has multiple myeloma by history and lumbar spinal stenosis. He uses a walker. He has actinic keratosis, actinic purpura and xerotic skin. He is [place], [place] ([id number]) severely damaged skin on his forearms READMISSION 01/05/17; this is a elderly man that we've had in clinic multiple times most recently in the earlier part of this year for a wound on the left and olecranon area which was traumatic. His current problem started on 12/05/16 when he sustained a fall with a large scalp laceration over the occiput. This was repaired with several staples and stitches. There was bleeding noted on 11/2 from the wound which was not observed in the ER and he was sent home. His sutures were removed on 12/20/16 however he was seen again on 12/21/16 in the ER with bleeding from the wound. At that time there was extensive debridement of granulation tissue and redundant skin flap was performed to approximate skin borders. The wound was stapled. He was referred back to his primary doctor for removal of the staples however the primary doctor sent him here. In the meantime he has undergone a kyphoplasty at 12/30/16 of T10 looking through [place] 01/12/17; patient with a large laceration  on the occiput of his scalp. I removed his  stitches last week and there was still open areas remaining. We've been using silver alginate. He is doing well Objective Constitutional Vitals Time Taken: 11:09 AM, Height: 70 in, Weight: 181 lbs, BMI: 26, Temperature: 97.6 F, Pulse: 64 bpm, Respiratory Rate: 18 breaths/min, Blood Pressure: 110/44 mmHg. Integumentary (Hair, Skin) Wound #9 status is Open. Original cause of wound was Trauma. The wound is located on the Head - Parietal. The wound measures 3.5cm length x 1.1cm width x 0.1cm depth; 3.024cm^2 area and 0.302cm^3 volume. There is no tunneling or undermining noted. There is a none present amount of drainage noted. The wound margin is flat and intact. There is no granulation within the wound bed. There is a large (67-100%) amount of necrotic tissue within the wound bed including Eschar. The periwound skin appearance exhibited: Dry/Scaly. The periwound skin appearance did not exhibit: Callus, Crepitus, Excoriation, Induration, Rash, Scarring, Maceration, Atrophie Blanche, Cyanosis, Ecchymosis, Hemosiderin Staining, Mottled, Pallor, Rubor, Erythema. Assessment Active Problems ICD-10 S01.01XD - Laceration without foreign body of scalp, subsequent encounter Procedures Wound #9 Pre-procedure diagnosis of Wound #9 is a Trauma, Other located on the Head - Parietal . There was a Skin/Dermis Open Wound/Selective [phone]) debridement with total area of 1 sq cm performed by Ricard Dillon, MD. with the following instrument(s): Curette to remove Viable and Non-Viable tissue/material including Eschar and Skin after achieving Alter, Giovannie L. ([id number]) pain control using Other (lidocaine 4%). A time out was conducted at 11:26, prior to the start of the procedure. A Minimum amount of bleeding was controlled with Pressure. The procedure was tolerated well with a pain level of 0 throughout and a pain level of 0 following the procedure. Post  Debridement Measurements: 3.5cm length x 1.1cm width x 0.1cm depth; 0.302cm^3 volume. Character of Wound/Ulcer Post Debridement is stable. Post procedure Diagnosis Wound #9: Same as Pre-Procedure Plan Anesthetic: Wound #9 Head - Parietal: Topical Lidocaine 4% cream applied to wound bed prior to debridement - in clinic Primary Wound Dressing: Wound #9 Head - Parietal: Silvercel Non-Adherent Secondary Dressing: Wound #9 Head - Parietal: Other - coverlet Dressing Change Frequency: Wound #9 Head - Parietal: Change dressing every other day. Follow-up Appointments: Wound #9 Head - Parietal: Return Appointment in 1 week. #1 debridement is noted #2 continue silver cell #3 his wife is changing the dressing #4 should be closed next week Electronic Signature(s) Signed: 01/21/2017 9:20:07 AM By: Gretta Cool, BSN, RN, CWS, Kim RN, BSN Signed: 01/26/2017 2:49:40 PM By: Linton Ham MD Previous Signature: 01/18/2017 10:39:26 AM Version By: Gretta Cool BSN, RN, CWS, Kim RN, BSN Previous Signature: 01/12/2017 4:33:39 PM Version By: Linton Ham MD Entered By: Gretta Cool, BSN, RN, CWS, Kim on 01/21/2017 09:20:06 [place], [place] ([id number]) -------------------------------------------------------------------------------- SuperBill Details Patient Name: Reisig, Temple L. Date of Service: 01/12/2017 Medical Record Number: [id number] Patient Account Number: [id number] Date of Birth/Sex: 04/04/1923 (83 y.o. Male) Treating RN: Roger Shelter Primary Care Provider: [facility], [facility] Other Clinician: Referring Provider: [facility], [facility] Treating Provider/Extender: Ricard Dillon Weeks in Treatment: 1 Diagnosis Coding ICD-10 Codes Code Description S01.01XD Laceration without foreign body of scalp, subsequent encounter Facility Procedures CPT4 Code: [id number] Description: [id number] - DEBRIDE WOUND 1ST 20 SQ CM OR < ICD-10 Diagnosis Description S01.01XD Laceration without foreign body of scalp, subsequent encou Modifier:  nter Quantity: 1 Physician Procedures CPT4 Code: [id number] Description: 16606 - WC PHYS DEBR WO ANESTH 20 SQ CM ICD-10 Diagnosis Description S01.01XD Laceration without foreign body of scalp, subsequent encou Modifier: nter Quantity: 1 Electronic Signature(s) Signed: 01/12/2017 12:44:17  PM By: Roger Shelter Signed: 01/12/2017 4:33:39 PM By: Linton Ham MD Entered By: Roger Shelter on 01/12/2017 12:44:17

## 2017-01-13 NOTE — Telephone Encounter (Signed)
Pt wife stated that they no longer need a oxygen tank and the equipment. Pt wife states they are having bugs killed they are trying to do and they fear that  oxygen tank  can cause any problems.  Called [facility] that number was provided [phone] to speak to a representative. Earlie Server stated all they need is a discharged order faxed over to them [phone] so it could be picked up. She stated they usual pick up the next day but the time varies. Pt states she will be back at the house around 2pm.

## 2017-01-13 NOTE — Telephone Encounter (Signed)
Copied from [place] [id number]. Topic: Inquiry >> Jan 13, 2017 10:45 AM Arletha Grippe wrote: Reason for KOE:CXFQHK with [facility] called - pt will miss home health services this week, caregiver Posey Pronto stated that they have too many appts and things going on this week. Services will start back next week  Cb number for [facility] is [phone]

## 2017-01-14 ENCOUNTER — Ambulatory Visit (INDEPENDENT_AMBULATORY_CARE_PROVIDER_SITE_OTHER): Payer: Medicare Other | Admitting: Family Medicine

## 2017-01-14 ENCOUNTER — Other Ambulatory Visit: Payer: Self-pay | Admitting: *Deleted

## 2017-01-14 ENCOUNTER — Telehealth: Payer: Self-pay

## 2017-01-14 ENCOUNTER — Encounter: Payer: Self-pay | Admitting: Family Medicine

## 2017-01-14 ENCOUNTER — Encounter: Payer: Self-pay | Admitting: *Deleted

## 2017-01-14 VITALS — BP 100/48 | HR 64 | Temp 97.4°F | Resp 16 | Ht 70.0 in | Wt 186.8 lb

## 2017-01-14 DIAGNOSIS — Z9889 Other specified postprocedural states: Secondary | ICD-10-CM | POA: Diagnosis not present

## 2017-01-14 DIAGNOSIS — I10 Essential (primary) hypertension: Secondary | ICD-10-CM | POA: Diagnosis not present

## 2017-01-14 DIAGNOSIS — I2510 Atherosclerotic heart disease of native coronary artery without angina pectoris: Secondary | ICD-10-CM | POA: Diagnosis not present

## 2017-01-14 DIAGNOSIS — J95821 Acute postprocedural respiratory failure: Secondary | ICD-10-CM

## 2017-01-14 NOTE — Telephone Encounter (Signed)
Discussed with patient's wife and the patient during today's office visit, okay to discontinue oxygen tank from the place of residence

## 2017-01-14 NOTE — Progress Notes (Signed)
Name: [name]   MRN: [id number]    DOB: October 17, 1923   Date:01/14/2017       Progress Note  Subjective  Chief Complaint  Chief Complaint  Patient presents with  . Hospitalization Follow-up  . Back Pain    Dr. Rudene Christians performed surgery on his back due to him having a fracture on his thoracic spine. Patient was given oxygen due to surgery his rate was low, now his rate his higher since being out of surgery and has a follow up with Dr. Rudene Christians tomorrow.     HPI  Pt. Presents for hospital follow up, he had surgery for 10th thoracic vertebral fracture by Dr. Rudene Christians and then was admitted for acute respiratory failure, discharged on home oxygen and now feeling better, no respiratory complaints, normal oxygen saturations, able to ambulate with walker.  Patient's wife wants me to authorize removal of the oxygen equipment at their assisted living facility she reports a problem with insects and bugs at their residence and she is going to call an exterminator, she has apparently been told that the exterminator would be working with elevated temperatures and there is a risk of fire so she wants oxygen cylinders removed. She assures me that there will be access to oxygen in case patient needs it right away. Oxygen was ordered at the time of hospital discharge for only 3 days to help stabilize his cardiopulmonary status  Patient's blood pressure is also below normal at 100/48 mmHg, we will decrease the dose to metoprolol, patient is a symptomatic at this time  Past Medical History:  Diagnosis Date  . Anginal pain ([place])    c/o heaviness a few years ago.  stent placed and all resolved  . Anxiety   . BPH (benign prostatic hypertrophy)   . Carotid stenosis    a. 05/2015 Carotid U/S: mild nonobs atherosclerosis. No need for f/u.  Marland Kitchen Chronic back pain   . Coronary artery disease    a. 06/2013 Cath/PCI: LAD 50-70p (FFR 0.82-->PCI w/ 3.5x24 Promus DES), RCA subtotally occluded w/ L->R collats, LCX 30ost.  .  Dyslipidemia   . GERD (gastroesophageal reflux disease)   . Hx of [name]   . Hyperlipidemia   . Hypertension   . Inguinal hernia   . Lightheadedness    a. chronic, somewhat positional.  . 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 Major depression   . Pleural effusion    left  . Sleep apnea    will not use cpap    Past Surgical History:  Procedure Laterality Date  . ANTERIOR APPROACH HEMI HIP ARTHROPLASTY Right 04/16/2016   Procedure: ANTERIOR APPROACH HEMI HIP ARTHROPLASTY;  Surgeon: Hessie Knows, MD;  Location: [facility] ORS;  Service: Orthopedics;  Laterality: Right;  . APPENDECTOMY     83 years old  . arm fracture Right 2008   rod in arm  . CARDIAC CATHETERIZATION  2002  . CARDIAC CATHETERIZATION  06/2013   stent placed  . COLONOSCOPY    . EYE SURGERY Bilateral 2008   cataract extraction  . FEMUR FRACTURE SURGERY Left    with rod  . HEMORROIDECTOMY     75 years ago  . HIP FRACTURE SURGERY    . JOINT REPLACEMENT Bilateral 03/2016   right partial knee replacement, bilateral THR  . KYPHOPLASTY N/A 12/30/2016   Procedure: [id number];  Surgeon: Hessie Knows, MD;  Location: [facility] ORS;  Service:  Orthopedics;  Laterality: N/A;    Family History  Problem Relation Age of Onset  . Heart disease Mother   . Stroke Mother   . Diabetes Father   . Dementia Father   . Stroke Sister   . Stroke Brother   . Stroke Sister   . Stroke Sister   . Stroke Brother   . Stroke Brother     Social History   Socioeconomic History  . Marital status: Married    Spouse name: Not on file  . Number of children: Not on file  . Years of education: Not on file  . Highest education level: Not on file  Social Needs  . Financial resource strain: Not on file  . Food insecurity - worry: Not on file  . Food insecurity - inability: Not on file  . Transportation needs - medical: Not on file  . Transportation needs - non-medical:  Not on file  Occupational History  . Not on file  Tobacco Use  . Smoking status: Former Smoker    Packs/day: 2.00    Years: 7.00    Pack years: 14.00    Types: Cigarettes    Last attempt to quit: 09/17/1953    Years since quitting: 63.3  . Smokeless tobacco: Never Used  Substance and Sexual Activity  . Alcohol use: No    Alcohol/week: 0.0 oz  . Drug use: No  . Sexual activity: Not Currently  Other Topics Concern  . Not on file  Social History Narrative  . Not on file     Current Outpatient Medications:  .  acetaminophen (TYLENOL) 500 MG tablet, Take 2 tablets (1,000 mg total) by mouth every 6 (six) hours. (Patient taking differently: Take 1,000 mg every 8 (eight) hours by mouth. ), Disp: 30 tablet, Rfl: 0 .  albuterol (PROVENTIL HFA;VENTOLIN HFA) 108 (90 Base) MCG/ACT inhaler, Inhale 2 puffs every 6 (six) hours as needed into the lungs for wheezing or shortness of breath., Disp: 1 Inhaler, Rfl: 2 .  clonazePAM (KLONOPIN) 0.5 MG tablet, Take 1 tablet (0.5 mg total) by mouth 3 (three) times daily as needed for anxiety., Disp: 90 tablet, Rfl: 2 .  doxazosin (CARDURA) 1 MG tablet, Take 1 tablet (1 mg total) by mouth daily., Disp: 90 tablet, Rfl: 0 .  furosemide (LASIX) 40 MG tablet, Take 1 tablet (40 mg total) 2 (two) times daily by mouth. 40 mg p.o. twice daily for 3 days followed by 40 mg p.o. daily, Disp: 60 tablet, Rfl: 11 .  isosorbide mononitrate (IMDUR) 30 MG 24 hr tablet, Take 1 tablet (30 mg total) by mouth daily., Disp: 90 tablet, Rfl: 1 .  lidocaine (LIDODERM) 5 %, Place 1 patch onto the skin daily. Remove & Discard patch within 12 hours or as directed by MD, Disp: 30 patch, Rfl: 1 .  metoprolol tartrate (LOPRESSOR) 25 MG tablet, Take 1-2 tablets (25-50 mg total) 2 (two) times daily by mouth. Take 2 tablets (50mg ) in the morning and 1 tablet (25mg ) at bedtime, Disp: 270 tablet, Rfl: 0 .  Multiple Vitamins-Minerals (PRESERVISION/LUTEIN PO), Take by mouth 2 (two) times daily.,  Disp: , Rfl:  .  nitroGLYCERIN (NITROSTAT) 0.4 MG SL tablet, Place 0.4 mg under the tongue every 5 (five) minutes as needed for chest pain., Disp: , Rfl:  .  pantoprazole (PROTONIX) 40 MG tablet, Take 1 tablet (40 mg total) daily by mouth., Disp: 90 tablet, Rfl: 0 .  polyethylene glycol (MIRALAX / GLYCOLAX) packet, Take 17 g by  mouth daily., Disp: , Rfl:  .  pravastatin (PRAVACHOL) 40 MG tablet, Take 1 tablet (40 mg total) by mouth daily. (Patient taking differently: Take 40 mg daily by mouth. ), Disp: 90 tablet, Rfl: 0 .  Saccharomyces boulardii (PROBIOTIC) 250 MG CAPS, Take 1 tablet daily at 6 (six) AM by mouth., Disp: 14 capsule, Rfl: 1 .  sertraline (ZOLOFT) 100 MG tablet, Take 1.5 tablets (150 mg total) by mouth at bedtime., Disp: 135 tablet, Rfl: 0 .  traMADol (ULTRAM) 50 MG tablet, Take 25 mg 3 (three) times daily by mouth. , Disp: , Rfl:  .  amoxicillin-clavulanate (AUGMENTIN) 875-125 MG tablet, Take 1 tablet 2 (two) times daily for 14 days by mouth. (Patient not taking: Reported on 01/14/2017), Disp: 14 tablet, Rfl: 0 .  azithromycin (ZITHROMAX) 250 MG tablet, Daily for 4 days (Patient not taking: Reported on 01/14/2017), Disp: 4 each, Rfl: 0 .  bacitracin 500 UNIT/GM ointment, Apply 1 application topically 2 (two) times daily. (Patient not taking: Reported on 01/14/2017), Disp: 15 g, Rfl: 0  Allergies  Allergen Reactions  . Cyclobenzaprine Other (See Comments)    Hallucination. Mental instability.  DO NOT GIVE ANY MUSCLE RELAXANTS  . Baclofen Other (See Comments)    Confusion and weakness  . Nsaids     Avoids due to liver.  . Sulfamethoxazole-Trimethoprim Nausea Only and Other (See Comments)    Patient unaware of this as an allergy  . Tolmetin Other (See Comments)    Avoids due to liver.it is an nsaid     ROS  Please see history of present illness for complete discussion of ROS  Objective  Vitals:   01/14/17 1038  BP: (!) 100/48  Pulse: 64  Resp: 16  Temp: (!) 97.4 F  (36.3 C)  TempSrc: Oral  SpO2: 98%  Weight: 186 lb 12.8 oz (84.7 kg)  Height: 5\' 10"  (1.778 m)    Physical Exam  Constitutional: He is oriented to person, place, and time and well-developed, well-nourished, and in no distress.  HENT:  Head: Normocephalic and atraumatic.  Cardiovascular: Normal rate, regular rhythm and normal heart sounds.  Pulmonary/Chest: He has no decreased breath sounds. He has no wheezes. He has rales in the left lower field.  Abdominal: Soft. Bowel sounds are normal. There is no tenderness.  Neurological: He is alert and oriented to person, place, and time.  Skin:     Surgical site appears to be healing well.  Psychiatric: Mood, memory, affect and judgment normal.  Nursing note and vitals reviewed.    Assessment & Plan  1. Essential hypertension Advised to decrease metoprolol to 25 mg a morning and continue 25 mg at bedtime, check blood pressure regularly  2. Respiratory failure, post-operative (HCC) Appears to have resolved, note is made of a chronic left pleural effusion which patient reports is from an accident many years ago, no respiratory distress, after confirmation with both patient and his wife, we will order removal of oxygen tanks at their assisted living facility  3. S/P kyphoplasty Notes from surgery reviewed, patient appears to be healing well and has follow-up scheduled with orthopedics   Shamonica Schadt [facility] 01/14/2017 10:51 AM

## 2017-01-14 NOTE — Patient Outreach (Addendum)
[facility]) Care Management  01/14/2017  Joshawa L Maniaci Apr 24, 1923 [id number]  Telephone Screen  Referral Date:  12/22/2016 Referral Source:  [facility] ED Census Reason for Referral:  "6 or more ED visits in the past 6 months" Insurance:  Medicare  Outreach attempt: Successful telephone outreach to patient for telephone screening.  Patient very hard of hearing and requested I speak with his wife.  Wife did not want to confirm HIPAA until she knew what the call was concerning.  [facility] services explained and reviewed with wife.  Wife stated "there is not enough hours in the day to add another person".  States they are currently receiving home health care and "this is enough at this time".  Wife declines screening process and [facility] services at this time.  Informed wife I would send out a letter and pamphlet and to contact [facility] in the future if they feel services is beneficial.  Wife appreciative and stated understanding.  Plan:  RN Health Coach to notify [facility] CM administrative assistant of case closure status.  RN Health Coach to send patient successful outreach letter.  [place] [phone] [email or web address]

## 2017-01-14 NOTE — Telephone Encounter (Signed)
Copied from [place] [id number]. Topic: General - Other >> Jan 13, 2017  4:14 PM Carolyn Stare wrote: Reason for CRM: Sheppard Evens with [facility] network would like for Dr Manuella Ghazi to discuss [facility] with pt  if he thinks pt will benefit from there services

## 2017-01-15 NOTE — Telephone Encounter (Signed)
Spoke with Clifton James from [place] and reaffirmed [facility] appointments for next week

## 2017-01-19 ENCOUNTER — Encounter: Payer: Medicare Other | Attending: Physician Assistant | Admitting: Physician Assistant

## 2017-01-19 DIAGNOSIS — W06XXXD Fall from bed, subsequent encounter: Secondary | ICD-10-CM | POA: Diagnosis not present

## 2017-01-19 DIAGNOSIS — E785 Hyperlipidemia, unspecified: Secondary | ICD-10-CM | POA: Diagnosis not present

## 2017-01-19 DIAGNOSIS — F419 Anxiety disorder, unspecified: Secondary | ICD-10-CM | POA: Insufficient documentation

## 2017-01-19 DIAGNOSIS — I6529 Occlusion and stenosis of unspecified carotid artery: Secondary | ICD-10-CM | POA: Insufficient documentation

## 2017-01-19 DIAGNOSIS — G510 Bell's palsy: Secondary | ICD-10-CM | POA: Diagnosis not present

## 2017-01-19 DIAGNOSIS — Z955 Presence of coronary angioplasty implant and graft: Secondary | ICD-10-CM | POA: Diagnosis not present

## 2017-01-19 DIAGNOSIS — Z87891 Personal history of nicotine dependence: Secondary | ICD-10-CM | POA: Insufficient documentation

## 2017-01-19 DIAGNOSIS — I2510 Atherosclerotic heart disease of native coronary artery without angina pectoris: Secondary | ICD-10-CM | POA: Diagnosis not present

## 2017-01-19 DIAGNOSIS — I10 Essential (primary) hypertension: Secondary | ICD-10-CM | POA: Diagnosis not present

## 2017-01-19 DIAGNOSIS — K219 Gastro-esophageal reflux disease without esophagitis: Secondary | ICD-10-CM | POA: Insufficient documentation

## 2017-01-19 DIAGNOSIS — S0101XD Laceration without foreign body of scalp, subsequent encounter: Secondary | ICD-10-CM | POA: Diagnosis not present

## 2017-01-26 ENCOUNTER — Ambulatory Visit: Payer: Medicare Other | Admitting: Internal Medicine

## 2017-01-27 ENCOUNTER — Encounter: Payer: Self-pay | Admitting: *Deleted

## 2017-01-27 ENCOUNTER — Encounter: Payer: Medicare Other | Admitting: Internal Medicine

## 2017-01-27 DIAGNOSIS — S0101XD Laceration without foreign body of scalp, subsequent encounter: Secondary | ICD-10-CM | POA: Diagnosis not present

## 2017-01-27 NOTE — Telephone Encounter (Signed)
This encounter was created in error - please disregard.

## 2017-01-30 NOTE — Progress Notes (Addendum)
[name], [name] ([id number]) Visit Report for 01/27/2017 Arrival Information Details Patient Name: [name], [name] Date of Service: 01/27/2017 12:30 PM Medical Record Number: [id number] Patient Account Number: [id number] Date of Birth/Sex: 18-Nov-1923 (83 y.o. Male) Treating RN: Cornell Barman Primary Care Brandilyn Nanninga: [facility], [facility] Other Clinician: Referring Beonka Amesquita: [facility], [facility] Treating Preslynn Bier/Extender: Tito Dine in Treatment: 3 Visit Information History Since Last Visit Added or deleted any medications: No Patient Arrived: Walker Any new allergies or adverse reactions: No Arrival Time: 12:39 Had a fall or experienced change in No Accompanied By: wife activities of daily living that may affect Transfer Assistance: None risk of falls: Patient Identification Verified: Yes Signs or symptoms of abuse/neglect since last visito No Secondary Verification Process Completed: Yes Hospitalized since last visit: No Patient Requires Transmission-Based Precautions: No Has Dressing in Place as Prescribed: Yes Patient Has Alerts: No Pain Present Now: No Electronic Signature(s) Signed: 01/27/2017 2:02:10 PM By: Gretta Cool, BSN, RN, CWS, Kim RN, BSN Entered By: Gretta Cool, BSN, RN, CWS, Kim on 01/27/2017 12:40:02 [name], [name] ([id number]) -------------------------------------------------------------------------------- Clinic Level of Care Assessment Details Patient Name: [name], [name] Date of Service: 01/27/2017 12:30 PM Medical Record Number: [id number] Patient Account Number: [id number] Date of Birth/Sex: 08-27-1923 (83 y.o. Male) Treating RN: Cornell Barman Primary Care Golden Emile: [facility], [facility] Other Clinician: Referring Miriam Liles: [facility], [facility] Treating Demontrae Gilbert/Extender: Tito Dine in Treatment: 3 Clinic Level of Care Assessment Items TOOL 4 Quantity Score []  - Use when only an EandM is performed on FOLLOW-UP visit 0 ASSESSMENTS - Nursing Assessment / Reassessment []  - Reassessment of  Co-morbidities (includes updates in patient status) 0 X- 1 5 Reassessment of Adherence to Treatment Plan ASSESSMENTS - Wound and Skin Assessment / Reassessment X - Simple Wound Assessment / Reassessment - one wound 1 5 []  - 0 Complex Wound Assessment / Reassessment - multiple wounds []  - 0 Dermatologic / Skin Assessment (not related to wound area) ASSESSMENTS - Focused Assessment []  - Circumferential Edema Measurements - multi extremities 0 []  - 0 Nutritional Assessment / Counseling / Intervention []  - 0 Lower Extremity Assessment (monofilament, tuning fork, pulses) []  - 0 Peripheral Arterial Disease Assessment (using hand held doppler) ASSESSMENTS - Ostomy and/or Continence Assessment and Care []  - Incontinence Assessment and Management 0 []  - 0 Ostomy Care Assessment and Management (repouching, etc.) PROCESS - Coordination of Care X - Simple Patient / Family Education for ongoing care 1 15 []  - 0 Complex (extensive) Patient / Family Education for ongoing care []  - 0 Staff obtains Programmer, systems, Records, Test Results / Process Orders []  - 0 Staff telephones HHA, Nursing Homes / Clarify orders / etc []  - 0 Routine Transfer to another Facility (non-emergent condition) []  - 0 Routine Hospital Admission (non-emergent condition) []  - 0 New Admissions / Biomedical engineer / Ordering NPWT, Apligraf, etc. []  - 0 Emergency Hospital Admission (emergent condition) X- 1 10 Simple Discharge Coordination [name], [name] ([id number]) []  - 0 Complex (extensive) Discharge Coordination PROCESS - Special Needs []  - Pediatric / Minor Patient Management 0 []  - 0 Isolation Patient Management []  - 0 Hearing / Language / Visual special needs []  - 0 Assessment of Community assistance (transportation, D/C planning, etc.) []  - 0 Additional assistance / Altered mentation []  - 0 Support Surface(s) Assessment (bed, cushion, seat, etc.) INTERVENTIONS - Wound Cleansing / Measurement X - Simple  Wound Cleansing - one wound 1 5 []  - 0 Complex Wound Cleansing - multiple wounds X- 1 5 Wound Imaging (photographs - any  number of wounds) []  - 0 Wound Tracing (instead of photographs) X- 1 5 Simple Wound Measurement - one wound []  - 0 Complex Wound Measurement - multiple wounds INTERVENTIONS - Wound Dressings []  - Small Wound Dressing one or multiple wounds 0 X- 1 15 Medium Wound Dressing one or multiple wounds []  - 0 Large Wound Dressing one or multiple wounds []  - 0 Application of Medications - topical []  - 0 Application of Medications - injection INTERVENTIONS - Miscellaneous []  - External ear exam 0 []  - 0 Specimen Collection (cultures, biopsies, blood, body fluids, etc.) []  - 0 Specimen(s) / Culture(s) sent or taken to Lab for analysis []  - 0 Patient Transfer (multiple staff / Civil Service fast streamer / Similar devices) []  - 0 Simple Staple / Suture removal (25 or less) []  - 0 Complex Staple / Suture removal (26 or more) []  - 0 Hypo / Hyperglycemic Management (close monitor of Blood Glucose) []  - 0 Ankle / Brachial Index (ABI) - do not check if billed separately X- 1 5 Vital Signs [name], [name] ([id number]) Has the patient been seen at the hospital within the last three years: Yes Total Score: 70 Level Of Care: New/Established - Level 2 Electronic Signature(s) Signed: 02/02/2017 11:38:18 AM By: Gretta Cool, BSN, RN, CWS, Kim RN, BSN Entered By: Gretta Cool, BSN, RN, CWS, Kim on 02/01/2017 15:22:24 [name] ([id number]) -------------------------------------------------------------------------------- Encounter Discharge Information Details Patient Name: [name], [name] Date of Service: 01/27/2017 12:30 PM Medical Record Number: [id number] Patient Account Number: [id number] Date of Birth/Sex: Jun 02, 1923 (83 y.o. Male) Treating RN: Cornell Barman Primary Care Miaya Lafontant: [facility], [facility] Other Clinician: Referring Domanique Luckett: [facility], [facility] Treating Avelyn Touch/Extender: Tito Dine in  Treatment: 3 Encounter Discharge Information Items Discharge Pain Level: 0 Discharge Condition: Stable Ambulatory Status: Walker Discharge Destination: Home Private Transportation: Auto Accompanied By: wife Schedule Follow-up Appointment: Yes Medication Reconciliation completed and provided Yes to Patient/Care Zoeie Ritter: Clinical Summary of Care: Electronic Signature(s) Signed: 02/01/2017 3:23:36 PM By: Gretta Cool, BSN, RN, CWS, Kim RN, BSN Entered By: Gretta Cool, BSN, RN, CWS, Kim on 02/01/2017 15:23:36 Lydia, Jaikob Carlean [name] ([id number]) -------------------------------------------------------------------------------- Lower Extremity Assessment Details Patient Name: [name], [name] Date of Service: 01/27/2017 12:30 PM Medical Record Number: [id number] Patient Account Number: [id number] Date of Birth/Sex: [birth year] (84 y.o. Male) Treating RN: Cornell Barman Primary Care Marge Vandermeulen: [facility], [place] Other Clinician: Referring Semiah Konczal: [facility], [place] Treating Veronnica Hennings/Extender: Tito Dine in Treatment: 3 Electronic Signature(s) Signed: 01/27/2017 2:02:10 PM By: Gretta Cool, BSN, RN, CWS, Kim RN, BSN Entered By: Gretta Cool, BSN, RN, CWS, Kim on 01/27/2017 12:51:01 [place], Rip Carlean [name] ([id number]) -------------------------------------------------------------------------------- Multi Wound Chart Details Patient Name: [name], [name] Date of Service: 01/27/2017 12:30 PM Medical Record Number: [id number] Patient Account Number: [id number] Date of Birth/Sex: 1923/05/28 (83 y.o. Male) Treating RN: Cornell Barman Primary Care Kennette Cuthrell: [facility], [facility] Other Clinician: Referring Corvin Sorbo: [facility], [facility] Treating Janette Harvie/Extender: Ricard Dillon Weeks in Treatment: 3 Vital Signs Height(in): 70 Pulse(bpm): 75 Weight(lbs): 181 Blood Pressure(mmHg): 143/59 Body Mass Index(BMI): 26 Temperature(F): 97.7 Respiratory Rate 16 (breaths/min): Photos: [9:No Photos] [N/A:N/A] Wound Location: [9:Head - Parietal] [N/A:N/A] Wounding  Event: [9:Trauma] [N/A:N/A] Primary Etiology: [9:Trauma, Other] [N/A:N/A] Date Acquired: [9:12/04/2016] [N/A:N/A] Weeks of Treatment: [9:3] [N/A:N/A] Wound Status: [9:Open] [N/A:N/A] Clustered Wound: [9:Yes] [N/A:N/A] Measurements L x W x D [9:3.5x1.2x0.1] [N/A:N/A] (cm) Area (cm) : [9:3.299] [N/A:N/A] Volume (cm) : [9:0.33] [N/A:N/A] % Reduction in Area: [9:37.80%] [N/A:N/A] % Reduction in Volume: [9:37.70%] [N/A:N/A] Classification: [9:Partial Thickness] [N/A:N/A] Periwound Skin Texture: [9:No Abnormalities Noted] [N/A:N/A] Periwound Skin Moisture: [  9:No Abnormalities Noted] [N/A:N/A] Periwound Skin Color: [9:No Abnormalities Noted No] [N/A:N/A N/A] Treatment Notes Electronic Signature(s) Signed: 01/27/2017 5:01:55 PM By: Linton Ham MD Entered By: Linton Ham on 01/27/2017 13:54:56 [name], [name] ([id number]) -------------------------------------------------------------------------------- Pain Assessment Details Patient Name: [name], [name] Date of Service: 01/27/2017 12:30 PM Medical Record Number: [id number] Patient Account Number: [id number] Date of Birth/Sex: Jan 21, 1924 (83 y.o. Male) Treating RN: Cornell Barman Primary Care Nakira Litzau: [facility], [facility] Other Clinician: Referring Lillyan Hitson: [facility], [facility] Treating Raheen Capili/Extender: Ricard Dillon Weeks in Treatment: 3 Active Problems Location of Pain Severity and Description of Pain Patient Has Paino No Site Locations With Dressing Change: No Pain Management and Medication Current Pain Management: Electronic Signature(s) Signed: 01/27/2017 2:02:10 PM By: Gretta Cool, BSN, RN, CWS, Kim RN, BSN Entered By: Gretta Cool, BSN, RN, CWS, Kim on 01/27/2017 12:40:22 [name], [name] ([id number]) -------------------------------------------------------------------------------- Patient/Caregiver Education Details Patient Name: [name], [name] Date of Service: 01/27/2017 12:30 PM Medical Record Number: [id number] Patient Account Number:  [id number] Date of Birth/Gender: [birth year] (83 y.o. Male) Treating RN: Cornell Barman Primary Care Physician: [facility], [facility] Other Clinician: Referring Physician: [facility], [place] Treating Physician/Extender: Tito Dine in Treatment: 3 Education Assessment Education Provided To: Patient Education Topics Provided Wound/Skin Impairment: Handouts: Caring for Your Ulcer Methods: Demonstration, Explain/Verbal Responses: State content correctly Electronic Signature(s) Signed: 02/02/2017 11:38:18 AM By: Gretta Cool, BSN, RN, CWS, Kim RN, BSN Entered By: Gretta Cool, BSN, RN, CWS, Kim on 02/01/2017 15:23:46 [place], [place]. ([id number]) -------------------------------------------------------------------------------- Wound Assessment Details Patient Name: [name], [name] Date of Service: 01/27/2017 12:30 PM Medical Record Number: [id number] Patient Account Number: [id number] Date of Birth/Sex: 10-08-1923 (83 y.o. Male) Treating RN: Cornell Barman Primary Care Swanson Farnell: [facility], [facility] Other Clinician: Referring Timya Trimmer: [facility], [facility] Treating Dwayne Bulkley/Extender: Ricard Dillon Weeks in Treatment: 3 Wound Status Wound Number: [id number] Primary Etiology: Trauma, Other Wound Location: Head - Parietal Wound Status: Open Wounding Event: Trauma Date Acquired: 12/04/2016 Weeks Of Treatment: 3 Clustered Wound: Yes Photos Photo Uploaded By: Gretta Cool, BSN, RN, CWS, Kim on 01/27/2017 14:54:36 Wound Measurements Length: (cm) 3.5 Width: (cm) 1.2 Depth: (cm) 0.1 Area: (cm) 3.299 Volume: (cm) 0.33 % Reduction in Area: 37.8% % Reduction in Volume: 37.7% Wound Description Classification: Partial Thickness Periwound Skin Texture Texture Color No Abnormalities Noted: No No Abnormalities Noted: No Moisture No Abnormalities Noted: No Electronic Signature(s) Signed: 01/27/2017 2:02:10 PM By: Gretta Cool, BSN, RN, CWS, Kim RN, BSN Entered By: Gretta Cool, BSN, RN, CWS, Kim on 01/27/2017 12:49:15 [name], Madison L.  ([id number]) -------------------------------------------------------------------------------- Vitals Details Patient Name: Enochs, Keigan L. Date of Service: 01/27/2017 12:30 PM Medical Record Number: [id number] Patient Account Number: [id number] Date of Birth/Sex: [birth year] (83 y.o. Male) Treating RN: Cornell Barman Primary Care Markale Birdsell: [facility], [facility] Other Clinician: Referring Gerado Nabers: [facility], [facility] Treating Kengo Sturges/Extender: Ricard Dillon Weeks in Treatment: 3 Vital Signs Time Taken: 12:40 Temperature (F): 97.7 Height (in): 70 Pulse (bpm): 75 Weight (lbs): 181 Respiratory Rate (breaths/min): 16 Body Mass Index (BMI): 26 Blood Pressure (mmHg): 143/59 Reference Range: 80 - 120 mg / dl Electronic Signature(s) Signed: 01/27/2017 2:02:10 PM By: Gretta Cool, BSN, RN, CWS, Kim RN, BSN Entered By: Gretta Cool, BSN, RN, CWS, Kim on 01/27/2017 12:40:40

## 2017-01-30 NOTE — Progress Notes (Addendum)
Brandon, [name] ([id number]) Visit Report for 01/27/2017 HPI Details Patient Name: [name], [name] Date of Service: 01/27/2017 12:30 PM Medical Record Number: [id number] Patient Account Number: [id number] Date of Birth/Sex: 1923-11-18 (83 y.o. Male) Treating RN: Cornell Barman Primary Care Provider: [facility], [facility] Other Clinician: Referring Provider: [facility], [facility] Treating Provider/Extender: Ricard Dillon Weeks in Treatment: 3 History of Present Illness Location: right elbow, left knee, left upper arm and left upper back Quality: Patient reports experiencing a dull pain to affected area(s). Severity: Patient states wound (s) are getting better. Duration: Patient has had the wound for < 1 weeks prior to presenting for treatment Timing: Pain in wound is Intermittent (comes and goes Context: The wound occurred when the patient had a fall and hurt his back Modifying Factors: Other treatment(s) tried include:local care with Neosporin ointment Associated Signs and Symptoms: Patient reports having:scar tissue opens out every time they do dressing HPI Description: 83 year old patient who was in [place] in May a couple of times for a lacerated wound which was healed over a couple of weeks. He now comes with recent injuries to his back and knees and both arms sustained after a fall. He did not seek any medical help and they use dressing material which was previously there. Past medical history significant for carotid stenosis, [name], [name], dyslipidemia, hypertension, anxiety, GERD, multiple myeloma, BPH, inguinal hernia and is status post appendectomy, hip fracture, cardiac catheterization. He smoked in the remote past and has not smoked for several years. He has had long-term low back pain and was evaluated for pain radiating to the left groin. 02-06-16 Mr. Fils progress for evaluation of multiple skin tears, he is accompanied by his wife. Neither patient nor his wife expressed the concerns regarding his  wounds and/or the treatment plan. They deny any falls since his last appointment or any new injuries. READMISSION this is a 83 year old man who is been seen in the clinic in 2017 for multiple skin tears after falling. These were fairly easy to close over unfortunately the patient's recent course is not gone well. He fell and suffered a right hip fracture at the beginning of March. He was discharged to a skilled facility and sometime during the stay at the skilled facility his daughter noted a bandage on his left elbow. It wasn't told later that they learned he had fallen out of bed sustaining an injury. After discharge a week ago he apparently had a off-balance issue falling against the wall and hit the left elbow again. Been using peroxide and Neosporin. Seen by the family doctor yesterday and x-ray of the left elbow was negative. He was put on doxycycline. The patient has a history of coronary artery disease status post stent, [name], hypertension, lumbar spinal stenosis, multiple myeloma and BPH. He is apparently had bilateral hip replacements secondary to falling with the most recent one being on the right. He states that on the times he has fallen he has got up to go to the bathroom and becomes "swimmy headed" although he does not describe the off-balance sensation were reproducibly with changes in position 06/10/16; left elbow wounds look improved this is on the posterior aspect over the olecranon area. Healthy-looking granulation. The Prisma apparently stuck to the wound bed at least as being applied by home health. We changed to [facility]. 06/17/16; the areas over the left elbow look improved. There are 2 small open areas. Light surface debridement to remove surface eschar. 06/24/16; left elbow as predicted last week  has epithelialized as predicted HOWEVER today he arrives with blisters in a perfect distribution limited to the wound. 07/01/16; the entire area of his elbow looks better  this week. Only one open area remains 07/08/16; the open areas around his left elbow look better. This is a frail man with a history of falls. He has multiple myeloma by history and lumbar spinal stenosis. He uses a walker. He has actinic keratosis, actinic purpura and xerotic skin. He is [name], [name] ([id number]) severely damaged skin on his forearms READMISSION 01/05/17; this is a elderly man that we've had in clinic multiple times most recently in the earlier part of this year for a wound on the left and olecranon area which was traumatic. His current problem started on 12/05/16 when he sustained a fall with a large scalp laceration over the occiput. This was repaired with several staples and stitches. There was bleeding noted on 11/2 from the wound which was not observed in the ER and he was sent home. His sutures were removed on 12/20/16 however he was seen again on 12/21/16 in the ER with bleeding from the wound. At that time there was extensive debridement of granulation tissue and redundant skin flap was performed to approximate skin borders. The wound was stapled. He was referred back to his primary doctor for removal of the staples however the primary doctor sent him here. In the meantime he has undergone a kyphoplasty at 12/30/16 of T10 looking through [place] 01/12/17; patient with a large laceration on the occiput of his scalp. I removed his stitches last week and there was still open areas remaining. We've been using silver alginate. He is doing well 01/19/17 on evaluation today patient appears to be doing fairly well in regard to his scalp wound. He has been tolerating the dressing changes without complication at this point. His wife performs the dressing changes for him. No fevers, chills, nausea, or vomiting noted at this time. 01/27/17; this patient's wound is larger than I would've thought. Pear-shaped wound on the occiput. He has been using silver alginate. Per our intake nurse he  arrived with a large scab over the wound which peeled off. There was some purulent looking drainage. Electronic Signature(s) Signed: 01/27/2017 5:01:55 PM By: Linton Ham MD Entered By: Linton Ham on 01/27/2017 13:57:50 [place], [place] ([id number]) -------------------------------------------------------------------------------- Physical Exam Details Patient Name: Web, Hamzah L. Date of Service: 01/27/2017 12:30 PM Medical Record Number: [id number] Patient Account Number: [id number] Date of Birth/Sex: February 07, 1924 (83 y.o. Male) Treating RN: Cornell Barman Primary Care Provider: [facility], [facility] Other Clinician: Referring Provider: [facility], [facility] Treating Provider/Extender: Ricard Dillon Weeks in Treatment: 3 Constitutional Sitting or standing Blood Pressure is within target range for patient.. Pulse regular and within target range for patient.Marland Kitchen Respirations regular, non-labored and within target range.. Temperature is normal and within the target range for the patient.Marland Kitchen appears in no distress. Notes wound exam; patient's wounds did not look infected. Surface of the wound does not require debridement. There is no evidence of surrounding infection Electronic Signature(s) Signed: 01/27/2017 5:01:55 PM By: Linton Ham MD Entered By: Linton Ham on 01/27/2017 13:58:59 [place], Chet L. ([id number]) -------------------------------------------------------------------------------- Physician Orders Details Patient Name: Latu, Mcclain L. Date of Service: 01/27/2017 12:30 PM Medical Record Number: [id number] Patient Account Number: [id number] Date of Birth/Sex: [birth year] (83 y.o. Male) Treating RN: Cornell Barman Primary Care Provider: [facility], [facility] Other Clinician: Referring Provider: [facility], [facility] Treating Provider/Extender: Tito Dine in Treatment: 3 Verbal / Phone Orders: No Diagnosis  Coding ICD-10 Coding Code Description S01.01XD Laceration without foreign body of scalp, subsequent  encounter Primary Wound Dressing Wound #9 Head - Parietal o Silvercel Non-Adherent Secondary Dressing Wound #9 Head - Parietal o Other - coverlet Dressing Change Frequency Wound #9 Head - Parietal o Change dressing every other day. Electronic Signature(s) Signed: 02/01/2017 3:21:54 PM By: Gretta Cool, BSN, RN, CWS, Kim RN, BSN Signed: 02/02/2017 9:37:46 AM By: Linton Ham MD Entered By: Gretta Cool, BSN, RN, CWS, Kim on 02/01/2017 15:21:54 [place], [place]. ([id number]) -------------------------------------------------------------------------------- Problem List Details Patient Name: Duley, Anguel L. Date of Service: 01/27/2017 12:30 PM Medical Record Number: [id number] Patient Account Number: [id number] Date of Birth/Sex: [birth year] (83 y.o. Male) Treating RN: Cornell Barman Primary Care Provider: [facility], [facility] Other Clinician: Referring Provider: [facility], [facility] Treating Provider/Extender: Tito Dine in Treatment: 3 Active Problems ICD-10 Encounter Code Description Active Date Diagnosis S01.01XD Laceration without foreign body of scalp, subsequent encounter 01/05/2017 Yes Inactive Problems Resolved Problems Electronic Signature(s) Signed: 01/27/2017 5:01:55 PM By: Linton Ham MD Entered By: Linton Ham on 01/27/2017 13:54:45 [place], [place] ([id number]) -------------------------------------------------------------------------------- Progress Note Details Patient Name: Vanderwoude, Hernan L. Date of Service: 01/27/2017 12:30 PM Medical Record Number: [id number] Patient Account Number: [id number] Date of Birth/Sex: 01-02-1924 (83 y.o. Male) Treating RN: Cornell Barman Primary Care Provider: [facility], [facility] Other Clinician: Referring Provider: [facility], [facility] Treating Provider/Extender: Ricard Dillon Weeks in Treatment: 3 Subjective History of Present Illness (HPI) The following HPI elements were documented for the patient's wound: Location: right elbow, left knee, left upper arm and left  upper back Quality: Patient reports experiencing a dull pain to affected area(s). Severity: Patient states wound (s) are getting better. Duration: Patient has had the wound for < 1 weeks prior to presenting for treatment Timing: Pain in wound is Intermittent (comes and goes Context: The wound occurred when the patient had a fall and hurt his back Modifying Factors: Other treatment(s) tried include:local care with Neosporin ointment Associated Signs and Symptoms: Patient reports having:scar tissue opens out every time they do dressing 83 year old patient who was in [place] in May a couple of times for a lacerated wound which was healed over a couple of weeks. He now comes with recent injuries to his back and knees and both arms sustained after a fall. He did not seek any medical help and they use dressing material which was previously there. Past medical history significant for carotid stenosis, [name], [name], dyslipidemia, hypertension, anxiety, GERD, multiple myeloma, BPH, inguinal hernia and is status post appendectomy, hip fracture, cardiac catheterization. He smoked in the remote past and has not smoked for several years. He has had long-term low back pain and was evaluated for pain radiating to the left groin. 02-06-16 Mr. Harker progress for evaluation of multiple skin tears, he is accompanied by his wife. Neither patient nor his wife expressed the concerns regarding his wounds and/or the treatment plan. They deny any falls since his last appointment or any new injuries. READMISSION this is a 83 year old man who is been seen in the clinic in 2017 for multiple skin tears after falling. These were fairly easy to close over unfortunately the patient's recent course is not gone well. He fell and suffered a right hip fracture at the beginning of March. He was discharged to a skilled facility and sometime during the stay at the skilled facility his daughter noted a bandage on his left elbow.  It wasn't told later that they learned he had fallen out of bed sustaining an  injury. After discharge a week ago he apparently had a off-balance issue falling against the wall and hit the left elbow again. Been using peroxide and Neosporin. Seen by the family doctor yesterday and x-ray of the left elbow was negative. He was put on doxycycline. The patient has a history of coronary artery disease status post stent, [name], hypertension, lumbar spinal stenosis, multiple myeloma and BPH. He is apparently had bilateral hip replacements secondary to falling with the most recent one being on the right. He states that on the times he has fallen he has got up to go to the bathroom and becomes "swimmy headed" although he does not describe the off-balance sensation were reproducibly with changes in position 06/10/16; left elbow wounds look improved this is on the posterior aspect over the olecranon area. Healthy-looking granulation. The Prisma apparently stuck to the wound bed at least as being applied by home health. We changed to [facility]. 06/17/16; the areas over the left elbow look improved. There are 2 small open areas. Light surface debridement to remove surface eschar. 06/24/16; left elbow as predicted last week has epithelialized as predicted HOWEVER today he arrives with blisters in a perfect distribution limited to the wound. 07/01/16; the entire area of his elbow looks better this week. Only one open area remains 07/08/16; the open areas around his left elbow look better. This is a frail man with a history of falls. He has multiple myeloma by history and lumbar spinal stenosis. He uses a walker. He has actinic keratosis, actinic purpura and xerotic skin. He is [place], [place] ([id number]) severely damaged skin on his forearms READMISSION 01/05/17; this is a elderly man that we've had in clinic multiple times most recently in the earlier part of this year for a wound on the left and olecranon  area which was traumatic. His current problem started on 12/05/16 when he sustained a fall with a large scalp laceration over the occiput. This was repaired with several staples and stitches. There was bleeding noted on 11/2 from the wound which was not observed in the ER and he was sent home. His sutures were removed on 12/20/16 however he was seen again on 12/21/16 in the ER with bleeding from the wound. At that time there was extensive debridement of granulation tissue and redundant skin flap was performed to approximate skin borders. The wound was stapled. He was referred back to his primary doctor for removal of the staples however the primary doctor sent him here. In the meantime he has undergone a kyphoplasty at 12/30/16 of T10 looking through [place] 01/12/17; patient with a large laceration on the occiput of his scalp. I removed his stitches last week and there was still open areas remaining. We've been using silver alginate. He is doing well 01/19/17 on evaluation today patient appears to be doing fairly well in regard to his scalp wound. He has been tolerating the dressing changes without complication at this point. His wife performs the dressing changes for him. No fevers, chills, nausea, or vomiting noted at this time. 01/27/17; this patient's wound is larger than I would've thought. Pear-shaped wound on the occiput. He has been using silver alginate. Per our intake nurse he arrived with a large scab over the wound which peeled off. There was some purulent looking drainage. Objective Constitutional Sitting or standing Blood Pressure is within target range for patient.. Pulse regular and within target range for patient.Marland Kitchen Respirations regular, non-labored and within target range.. Temperature is normal and  within the target range for the patient.Marland Kitchen appears in no distress. Vitals Time Taken: 12:40 PM, Height: 70 in, Weight: 181 lbs, BMI: 26, Temperature: 97.7 F, Pulse: 75 bpm,  Respiratory Rate: 16 breaths/min, Blood Pressure: 143/59 mmHg. General Notes: wound exam; patient's wounds did not look infected. Surface of the wound does not require debridement. There is no evidence of surrounding infection Integumentary (Hair, Skin) Wound #9 status is Open. Original cause of wound was Trauma. The wound is located on the Head - Parietal. The wound measures 3.5cm length x 1.2cm width x 0.1cm depth; 3.299cm^2 area and 0.33cm^3 volume. Assessment Active Problems ICD-10 S01.01XD - Laceration without foreign body of scalp, subsequent encounter Deike, Clemmie L. ([id number]) Plan Primary Wound Dressing: Wound #9 Head - Parietal: Silvercel Non-Adherent Secondary Dressing: Wound #9 Head - Parietal: Other - coverlet Dressing Change Frequency: Wound #9 Head - Parietal: Change dressing every other day. #1change the primary dressing to silver collagen which will hopefully allow the wound stay moist. #2 there is no evidence of infection Electronic Signature(s) Signed: 02/05/2017 11:01:01 AM By: Gretta Cool, BSN, RN, CWS, Kim RN, BSN Signed: 02/07/2017 7:29:02 AM By: Linton Ham MD Previous Signature: 01/27/2017 5:01:55 PM Version By: Linton Ham MD Entered By: Gretta Cool, BSN, RN, CWS, Kim on 02/05/2017 11:01:00 [place], [place] ([id number]) -------------------------------------------------------------------------------- SuperBill Details Patient Name: Raether, Kalvin L. Date of Service: 01/27/2017 Medical Record Number: [id number] Patient Account Number: [id number] Date of Birth/Sex: 05-05-1923 (83 y.o. Male) Treating RN: Cornell Barman Primary Care Provider: [facility], [facility] Other Clinician: Referring Provider: [facility], [facility] Treating Provider/Extender: Ricard Dillon Weeks in Treatment: 3 Diagnosis Coding ICD-10 Codes Code Description S01.01XD Laceration without foreign body of scalp, subsequent encounter Facility Procedures CPT4 Code: [id number] Description: [phone] - WOUND CARE VISIT-LEV 2  EST PT Modifier: Quantity: 1 Physician Procedures CPT4 Code: [id number] Description: 40102 - WC PHYS LEVEL 2 - EST PT ICD-10 Diagnosis Description S01.01XD Laceration without foreign body of scalp, subsequent enc Modifier: ounter Quantity: 1 Electronic Signature(s) Signed: 02/01/2017 3:22:36 PM By: Gretta Cool, BSN, RN, CWS, Kim RN, BSN Signed: 02/02/2017 9:37:46 AM By: Linton Ham MD Previous Signature: 01/27/2017 5:01:55 PM Version By: Linton Ham MD Entered By: Gretta Cool, BSN, RN, CWS, Kim on 02/01/2017 15:22:35

## 2017-02-02 ENCOUNTER — Encounter: Payer: Medicare Other | Admitting: Internal Medicine

## 2017-02-02 DIAGNOSIS — S0101XD Laceration without foreign body of scalp, subsequent encounter: Secondary | ICD-10-CM | POA: Diagnosis not present

## 2017-02-03 NOTE — Progress Notes (Addendum)
Brandon, [name] ([id number]) Visit Report for 02/02/2017 Arrival Information Details Patient Name: [name] [name], [name] L. Date of Service: 02/02/2017 3:30 PM Medical Record Number: [id number] Patient Account Number: [id number] Date of Birth/Sex: [birth year] (83 y.o. Male) Treating RN: Montey Hora Primary Care Jaquae Rieves: [facility], SY[facility] Other Clinician: Referring Trypp Heckmann: [facility], SYED Treating Saben Donigan/Extender: Tito Dine in Treatment: 4 Visit Information History Since Last Visit All ordered tests and consults were completed: No Patient Arrived: [name] Added or deleted any medications: No Arrival Time: 15:15 Any new allergies or adverse reactions: No Accompanied By: wife Had a fall or experienced change in No Transfer Assistance: None activities of daily living that may affect Patient Identification Verified: Yes risk of falls: Secondary Verification Process Completed: Yes Signs or symptoms of abuse/neglect since last visito No Patient Requires Transmission-Based Precautions: No Hospitalized since last visit: No Patient Has Alerts: No Pain Present Now: No Electronic Signature(s) Signed: 02/02/2017 4:51:19 PM By: Montey Hora Entered By: Montey Hora on 02/02/2017 15:16:21 [place], [name] L. ([id number]) -------------------------------------------------------------------------------- Clinic Level of Care Assessment Details Patient Name: [name] [name] L. Date of Service: 02/02/2017 3:30 PM Medical Record Number: [id number] Patient Account Number: [id number] Date of Birth/Sex: 1924-01-08 (83 y.o. Male) Treating RN: Roger Shelter Primary Care Amoree Newlon: [facility], SYED Other Clinician: Referring Cherylee Rawlinson: [facility], SYED Treating Aleenah Homen/Extender: Tito Dine in Treatment: 4 Clinic Level of Care Assessment Items TOOL 4 Quantity Score []  - Use when only an EandM is performed on FOLLOW-UP visit 0 ASSESSMENTS - Nursing Assessment / Reassessment []  - Reassessment of Co-morbidities  (includes updates in patient status) 0 X- 1 5 Reassessment of Adherence to Treatment Plan ASSESSMENTS - Wound and Skin Assessment / Reassessment X - Simple Wound Assessment / Reassessment - one wound 1 5 []  - 0 Complex Wound Assessment / Reassessment - multiple wounds []  - 0 Dermatologic / Skin Assessment (not related to wound area) ASSESSMENTS - Focused Assessment []  - Circumferential Edema Measurements - multi extremities 0 []  - 0 Nutritional Assessment / Counseling / Intervention []  - 0 Lower Extremity Assessment (monofilament, tuning fork, pulses) []  - 0 Peripheral Arterial Disease Assessment (using hand held doppler) ASSESSMENTS - Ostomy and/or Continence Assessment and Care []  - Incontinence Assessment and Management 0 []  - 0 Ostomy Care Assessment and Management (repouching, etc.) PROCESS - Coordination of Care X - Simple Patient / Family Education for ongoing care 1 15 []  - 0 Complex (extensive) Patient / Family Education for ongoing care []  - 0 Staff obtains Programmer, systems, Records, Test Results / Process Orders []  - 0 Staff telephones HHA, Nursing Homes / Clarify orders / etc []  - 0 Routine Transfer to another Facility (non-emergent condition) []  - 0 Routine Hospital Admission (non-emergent condition) []  - 0 New Admissions / Biomedical engineer / Ordering NPWT, Apligraf, etc. []  - 0 Emergency Hospital Admission (emergent condition) X- 1 10 Simple Discharge Coordination [name], [name] ([id number]) []  - 0 Complex (extensive) Discharge Coordination PROCESS - Special Needs []  - Pediatric / Minor Patient Management 0 []  - 0 Isolation Patient Management []  - 0 Hearing / Language / Visual special needs []  - 0 Assessment of Community assistance (transportation, D/C planning, etc.) []  - 0 Additional assistance / Altered mentation []  - 0 Support Surface(s) Assessment (bed, cushion, seat, etc.) INTERVENTIONS - Wound Cleansing / Measurement X - Simple Wound  Cleansing - one wound 1 5 []  - 0 Complex Wound Cleansing - multiple wounds X- 1 5 Wound Imaging (photographs - any number of wounds) []  - 0 Wound  Tracing (instead of photographs) X- 1 5 Simple Wound Measurement - one wound []  - 0 Complex Wound Measurement - multiple wounds INTERVENTIONS - Wound Dressings X - Small Wound Dressing one or multiple wounds 1 10 []  - 0 Medium Wound Dressing one or multiple wounds []  - 0 Large Wound Dressing one or multiple wounds []  - 0 Application of Medications - topical []  - 0 Application of Medications - injection INTERVENTIONS - Miscellaneous []  - External ear exam 0 []  - 0 Specimen Collection (cultures, biopsies, blood, body fluids, etc.) []  - 0 Specimen(s) / Culture(s) sent or taken to Lab for analysis []  - 0 Patient Transfer (multiple staff / Civil Service fast streamer / Similar devices) []  - 0 Simple Staple / Suture removal (25 or less) []  - 0 Complex Staple / Suture removal (26 or more) []  - 0 Hypo / Hyperglycemic Management (close monitor of Blood Glucose) []  - 0 Ankle / Brachial Index (ABI) - do not check if billed separately X- 1 5 Vital Signs [name], [name] ([id number]) Has the patient been seen at the hospital within the last three years: Yes Total Score: 65 Level Of Care: New/Established - Level 2 Electronic Signature(s) Signed: 02/02/2017 4:37:46 PM By: Roger Shelter Entered By: Roger Shelter on 02/02/2017 15:52:46 [name], [name] L. ([id number]) -------------------------------------------------------------------------------- Encounter Discharge Information Details Patient Name: [name], [name] Date of Service: 02/02/2017 3:30 PM Medical Record Number: [id number] Patient Account Number: [id number] Date of Birth/Sex: 14-Jan-1924 (83 y.o. Male) Treating RN: Roger Shelter Primary Care Jaycelynn Knickerbocker: [facility], [facility] Other Clinician: Referring Tyjah Hai: [facility], SYED Treating Ravleen Ries/Extender: Tito Dine in Treatment: 4 Encounter  Discharge Information Items Discharge Pain Level: 0 Discharge Condition: Stable Ambulatory Status: Walker Discharge Destination: Home Transportation: Private Auto Accompanied By: wife Schedule Follow-up Appointment: Yes Medication Reconciliation completed and No provided to Patient/Care Oskar Cretella: Provided on Clinical Summary of Care: 02/02/2017 Form Type Recipient Paper Patient AS Electronic Signature(s) Signed: 02/02/2017 4:37:46 PM By: Roger Shelter Entered By: Roger Shelter on 02/02/2017 15:54:02 Vent, Traeson L. ([id number]) -------------------------------------------------------------------------------- Lower Extremity Assessment Details Patient Name: Starkey, [name] L. Date of Service: 02/02/2017 3:30 PM Medical Record Number: [id number] Patient Account Number: [id number] Date of Birth/Sex: Oct 28, 1923 (83 y.o. Male) Treating RN: Roger Shelter Primary Care Boss Danielsen: [facility], Dossie Der Other Clinician: Referring Taylar Hartsough: [facility], SYED Treating Aquila Menzie/Extender: Ricard Dillon Weeks in Treatment: 4 Electronic Signature(s) Signed: 02/02/2017 4:37:46 PM By: Roger Shelter Entered By: Roger Shelter on 02/02/2017 15:28:20 [place], [place] ([id number]) -------------------------------------------------------------------------------- Multi Wound Chart Details Patient Name: [place]rano, [name] L. Date of Service: 02/02/2017 3:30 PM Medical Record Number: [id number] Patient Account Number: [id number] Date of Birth/Sex: 08/28/1923 (83 y.o. Male) Treating RN: Roger Shelter Primary Care Jerrianne Hartin: [facility], SYED Other Clinician: Referring Willodean Leven: [facility], SYED Treating Tomasina Keasling/Extender: Tito Dine in Treatment: 4 Vital Signs Height(in): 70 Pulse(bpm): 71 Weight(lbs): 181 Blood Pressure(mmHg): 141/57 Body Mass Index(BMI): 26 Temperature(F): 97.6 Respiratory Rate 20 (breaths/min): Photos: [N/A:N/A] Wound Location: Head - Parietal N/A N/A Wounding Event: Trauma N/A  N/A Primary Etiology: Trauma, Other N/A N/A Comorbid History: Cataracts, Chronic sinus N/A N/A problems/congestion, Sleep Apnea, Coronary Artery Disease, Osteoarthritis, Received Chemotherapy Date Acquired: 12/04/2016 N/A N/A Weeks of Treatment: 4 N/A N/A Wound Status: Open N/A N/A Clustered Wound: Yes N/A N/A Measurements L x W x D 0.5x0.4x0.1 N/A N/A (cm) Area (cm) : 0.157 N/A N/A Volume (cm) : 0.016 N/A N/A % Reduction in Area: 97.00% N/A N/A % Reduction in Volume: 97.00% N/A N/A Classification: Partial Thickness N/A N/A Exudate Amount: Small N/A  N/A Exudate Type: Serous N/A N/A Exudate Color: amber N/A N/A Wound Margin: Flat and Intact N/A N/A Granulation Amount: Large (67-100%) N/A N/A Granulation Quality: Red N/A N/A Necrotic Amount: None Present (0%) N/A N/A Epithelialization: Large (67-100%) N/A N/A Periwound Skin Texture: Excoriation: No N/A N/A Induration: No Izzo, Caymen L. ([id number]) Callus: No Crepitus: No Rash: No Scarring: No Periwound Skin Moisture: Dry/Scaly: Yes N/A N/A Maceration: No Periwound Skin Color: Atrophie Blanche: No N/A N/A Cyanosis: No Ecchymosis: No Erythema: No Hemosiderin Staining: No Mottled: No Pallor: No Rubor: No Tenderness on Palpation: No N/A N/A Wound Preparation: Ulcer Cleansing: N/A N/A Rinsed/Irrigated with Saline Topical Anesthetic Applied: Other: lidocaine 4% Treatment Notes Wound #9 (Head - Parietal) 1. Cleansed with: Clean wound with Normal Saline 2. Anesthetic Topical Lidocaine 4% cream to wound bed prior to debridement 4. Dressing Applied: Prisma Ag Notes coverlet bandage Electronic Signature(s) Signed: 02/03/2017 8:14:14 AM By: Linton Ham MD Previous Signature: 02/02/2017 4:37:46 PM Version By: Roger Shelter Entered By: Linton Ham on 02/02/2017 17:37:14 [place], [name] [name]. ([id number]) -------------------------------------------------------------------------------- Multi-Disciplinary Care  Plan Details Patient Name: Mindel, [name] L. Date of Service: 02/02/2017 3:30 PM Medical Record Number: [id number] Patient Account Number: [id number] Date of Birth/Sex: August 25, 1923 (83 y.o. Male) Treating RN: Roger Shelter Primary Care Tavious Griesinger: [facility], SYED Other Clinician: Referring Sundee Garland: [facility], SYED Treating Keerthana Vanrossum/Extender: Tito Dine in Treatment: 4 Active Inactive ` Abuse / Safety / Falls / Self Care Management Nursing Diagnoses: History of Falls Impaired physical mobility Potential for falls Potential for injury related to falls Self care deficit: actual or potential Goals: Patient will remain injury free related to falls Date Initiated: 01/05/2017 Target Resolution Date: 02/04/2017 Goal Status: Active Patient/caregiver will verbalize understanding of skin care regimen Date Initiated: 01/05/2017 Target Resolution Date: 02/04/2017 Goal Status: Active Patient/caregiver will verbalize/demonstrate measure taken to improve self care Date Initiated: 01/05/2017 Target Resolution Date: 02/04/2017 Goal Status: Active Patient/caregiver will verbalize/demonstrate measures taken to improve the patient's personal safety Date Initiated: 01/05/2017 Target Resolution Date: 02/04/2017 Goal Status: Active Patient/caregiver will verbalize/demonstrate measures taken to prevent injury and/or falls Date Initiated: 01/05/2017 Target Resolution Date: 02/04/2017 Goal Status: Active Interventions: Assess fall risk on admission and as needed Assess self care needs on admission and as needed Provide education on fall prevention Notes: ` Orientation to the Wound Care Program Nursing Diagnoses: Knowledge deficit related to the wound healing center program Goals: Patient/caregiver will verbalize understanding of the Mack, [name] L. ([id number]) Date Initiated: 01/05/2017 Target Resolution Date: 02/04/2017 Goal Status:  Active Interventions: Provide education on orientation to the wound center Notes: ` Wound/Skin Impairment Nursing Diagnoses: Impaired tissue integrity Goals: Patient/caregiver will verbalize understanding of skin care regimen Date Initiated: 01/05/2017 Target Resolution Date: 02/04/2017 Goal Status: Active Ulcer/skin breakdown will have a volume reduction of 30% by week 4 Date Initiated: 01/05/2017 Target Resolution Date: 02/04/2017 Goal Status: Active Interventions: Assess patient/caregiver ability to perform ulcer/skin care regimen upon admission and as needed Assess ulceration(s) every visit Notes: Electronic Signature(s) Signed: 02/02/2017 4:37:46 PM By: Roger Shelter Entered By: Roger Shelter on 02/02/2017 15:28:26 [place], [name] L. ([id number]) -------------------------------------------------------------------------------- Pain Assessment Details Patient Name: Coard, [name] L. Date of Service: 02/02/2017 3:30 PM Medical Record Number: [id number] Patient Account Number: [id number] Date of Birth/Sex: 10-21-1923 (83 y.o. Male) Treating RN: Montey Hora Primary Care Arisha Gervais: [name], Dossie Der Other Clinician: Referring Jacoby Zanni: [facility], SYED Treating Rennee Coyne/Extender: Ricard Dillon Weeks in Treatment: 4 Active Problems Location of Pain Severity and Description of Pain Patient Has  Paino No Site Locations Pain Management and Medication Current Pain Management: Electronic Signature(s) Signed: 02/02/2017 4:51:19 PM By: Montey Hora Entered By: Montey Hora on 02/02/2017 15:16:30 [place], [name] L. ([id number]) -------------------------------------------------------------------------------- Patient/Caregiver Education Details Patient Name: Nees, [name] L. Date of Service: 02/02/2017 3:30 PM Medical Record Number: [id number] Patient Account Number: [id number] Date of Birth/Gender: [birth year] (83 y.o. Male) Treating RN: Roger Shelter Primary Care Physician: [facility], SYED Other  Clinician: Referring Physician: [facility], Dossie Der Treating Physician/Extender: Tito Dine in Treatment: 4 Education Assessment Education Provided To: Patient and Caregiver Education Topics Provided Wound/Skin Impairment: Handouts: Caring for Your Ulcer Methods: Explain/Verbal Responses: State content correctly Electronic Signature(s) Signed: 02/02/2017 4:37:46 PM By: Roger Shelter Entered By: Roger Shelter on 02/02/2017 15:54:15 [place], [name] L. ([id number]) -------------------------------------------------------------------------------- Wound Assessment Details Patient Name: Messner, [name] L. Date of Service: 02/02/2017 3:30 PM Medical Record Number: [id number] Patient Account Number: [id number] Date of Birth/Sex: 1923-12-04 (83 y.o. Male) Treating RN: Roger Shelter Primary Care Hari Casaus: [facility], SYED Other Clinician: Referring Azyiah Bo: [facility], SYED Treating Dasie Chancellor/Extender: Ricard Dillon Weeks in Treatment: 4 Wound Status Wound Number: 9 Primary Trauma, Other Etiology: Wound Location: Head - Parietal Wound Open Wounding Event: Trauma Status: Date Acquired: 12/04/2016 Comorbid Cataracts, Chronic sinus problems/congestion, Weeks Of Treatment: 4 History: Sleep Apnea, Coronary Artery Disease, Clustered Wound: Yes Osteoarthritis, Received Chemotherapy Photos Wound Measurements Length: (cm) 0.5 Width: (cm) 0.4 Depth: (cm) 0.1 Area: (cm) 0.157 Volume: (cm) 0.016 % Reduction in Area: 97% % Reduction in Volume: 97% Epithelialization: Large (67-100%) Tunneling: No Undermining: No Wound Description Classification: Partial Thickness Foul Od Wound Margin: Flat and Intact Slough/ Exudate Amount: Small Exudate Type: Serous Exudate Color: amber or After Cleansing: No Fibrino Yes Wound Bed Granulation Amount: Large (67-100%) Granulation Quality: Red Necrotic Amount: None Present (0%) Periwound Skin Texture Texture Color No Abnormalities Noted: No No  Abnormalities Noted: No Callus: No Atrophie Blanche: No Crepitus: No Cyanosis: No Excoriation: No Ecchymosis: No [place], [name] L. ([id number]) Induration: No Erythema: No Rash: No Hemosiderin Staining: No Scarring: No Mottled: No Pallor: No Moisture Rubor: No No Abnormalities Noted: No Dry / Scaly: Yes Maceration: No Wound Preparation Ulcer Cleansing: Rinsed/Irrigated with Saline Topical Anesthetic Applied: Other: lidocaine 4%, Treatment Notes Wound #9 (Head - Parietal) 1. Cleansed with: Clean wound with Normal Saline 2. Anesthetic Topical Lidocaine 4% cream to wound bed prior to debridement 4. Dressing Applied: Prisma Ag Notes coverlet bandage Electronic Signature(s) Signed: 02/02/2017 4:51:30 PM By: Roger Shelter Previous Signature: 02/02/2017 4:37:46 PM Version By: Roger Shelter Entered By: Roger Shelter on 02/02/2017 16:51:29 [place], [place] ([id number]) -------------------------------------------------------------------------------- Vitals Details Patient Name: Gloor, [name] L. Date of Service: 02/02/2017 3:30 PM Medical Record Number: [id number] Patient Account Number: [id number] Date of Birth/Sex: [birth year] (83 y.o. Male) Treating RN: Montey Hora Primary Care Judene Logue: [facility], SYED Other Clinician: Referring Ethridge Sollenberger: [facility], SYED Treating Zayon Trulson/Extender: Tito Dine in Treatment: 4 Vital Signs Time Taken: 03:18 Temperature (F): 97.6 Height (in): 70 Pulse (bpm): 71 Weight (lbs): 181 Respiratory Rate (breaths/min): 20 Body Mass Index (BMI): 26 Blood Pressure (mmHg): 141/57 Reference Range: 80 - 120 mg / dl Electronic Signature(s) Signed: 02/02/2017 4:51:19 PM By: Montey Hora Entered By: Montey Hora on 02/02/2017 15:19:08

## 2017-02-04 NOTE — Progress Notes (Signed)
[name] ([id number]) Visit Report for 02/02/2017 HPI Details Patient Name: [name], [name] Date of Service: 02/02/2017 3:30 PM Medical Record Number: [id number] Patient Account Number: [id number] Date of Birth/Sex: 04-02-1923 (83 y.o. Male) Treating RN: [name] Primary Care Provider: [name], [name] Other Clinician: Referring Provider: [name], [name] Treating Provider/Extender: [name] in Treatment: 4 History of Present Illness Location: right elbow, left knee, left upper arm and left upper back Quality: Patient reports experiencing a dull pain to affected area(s). Severity: Patient states wound (s) are getting better. Duration: Patient has had the wound for < 1 weeks prior to presenting for treatment Timing: Pain in wound is Intermittent (comes and goes Context: The wound occurred when the patient had a fall and hurt his back Modifying Factors: Other treatment(s) tried include:local care with Neosporin ointment Associated Signs and Symptoms: Patient reports having:scar tissue opens out every time they do dressing HPI Description: 83 year old patient who was in [place] in May a couple of times for a lacerated wound which was healed over a couple of weeks. He now comes with recent injuries to his back and knees and both arms sustained after a fall. He did not seek any medical help and they use dressing material which was previously there. Past medical history significant for carotid stenosis, [name], [name], dyslipidemia, hypertension, anxiety, GERD, multiple myeloma, BPH, inguinal hernia and is status post appendectomy, hip fracture, cardiac catheterization. He smoked in the remote past and has not smoked for several years. He has had long-term low back pain and was evaluated for pain radiating to the left groin. 02-06-16 Mr. Lyster progress for evaluation of multiple skin tears, he is accompanied by his wife. Neither patient nor his wife expressed the concerns  regarding his wounds and/or the treatment plan. They deny any falls since his last appointment or any new injuries. READMISSION this is a 83 year old man who is been seen in the clinic in 2017 for multiple skin tears after falling. These were fairly easy to close over unfortunately the patient's recent course is not gone well. He fell and suffered a right hip fracture at the beginning of March. He was discharged to a skilled facility and sometime during the stay at the skilled facility his daughter noted a bandage on his left elbow. It wasn't told later that they learned he had fallen out of bed sustaining an injury. After discharge a week ago he apparently had a off-balance issue falling against the wall and hit the left elbow again. Been using peroxide and Neosporin. Seen by the family doctor yesterday and x-ray of the left elbow was negative. He was put on doxycycline. The patient has a history of coronary artery disease status post stent, [name], hypertension, lumbar spinal stenosis, multiple myeloma and BPH. He is apparently had bilateral hip replacements secondary to falling with the most recent one being on the right. He states that on the times he has fallen he has got up to go to the bathroom and becomes "swimmy headed" although he does not describe the off-balance sensation were reproducibly with changes in position 06/10/16; left elbow wounds look improved this is on the posterior aspect over the olecranon area. Healthy-looking granulation. The Prisma apparently stuck to the wound bed at least as being applied by home health. We changed to [facility]. 06/17/16; the areas over the left elbow look improved. There are 2 small open areas. Light surface debridement to remove surface eschar. 06/24/16; left elbow as predicted last week  has epithelialized as predicted HOWEVER today he arrives with blisters in a perfect distribution limited to the wound. 07/01/16; the entire area of his  elbow looks better this week. Only one open area remains 07/08/16; the open areas around his left elbow look better. This is a frail man with a history of falls. He has multiple myeloma by history and lumbar spinal stenosis. He uses a walker. He has actinic keratosis, actinic purpura and xerotic skin. He is [place], [place] ([id number]) severely damaged skin on his forearms READMISSION 01/05/17; this is a elderly man that we've had in clinic multiple times most recently in the earlier part of this year for a wound on the left and olecranon area which was traumatic. His current problem started on 12/05/16 when he sustained a fall with a large scalp laceration over the occiput. This was repaired with several staples and stitches. There was bleeding noted on 11/2 from the wound which was not observed in the ER and he was sent home. His sutures were removed on 12/20/16 however he was seen again on 12/21/16 in the ER with bleeding from the wound. At that time there was extensive debridement of granulation tissue and redundant skin flap was performed to approximate skin borders. The wound was stapled. He was referred back to his primary doctor for removal of the staples however the primary doctor sent him here. In the meantime he has undergone a kyphoplasty at 12/30/16 of T10 looking through [facility] 01/12/17; patient with a large laceration on the occiput of his scalp. I removed his stitches last week and there was still open areas remaining. We've been using silver alginate. He is doing well 01/19/17 on evaluation today patient appears to be doing fairly well in regard to his scalp wound. He has been tolerating the dressing changes without complication at this point. His wife performs the dressing changes for him. No fevers, chills, nausea, or vomiting noted at this time. 01/27/17; this patient's wound is larger than I would've thought. Pear-shaped wound on the occiput. He has been using silver alginate. Per  our intake nurse he arrived with a large scab over the wound which peeled off. There was some purulent looking drainage. 02/02/17; this patient's wound is still open although it is smaller. Still a cystlike area next to this which they asked me about every time Electronic Signature(s) Signed: 02/03/2017 8:14:14 AM By: Linton Ham MD Entered By: Linton Ham on 02/02/2017 17:38:18 [place], [place] L. ([id number]) -------------------------------------------------------------------------------- Physical Exam Details Patient Name: [name], [name] Date of Service: 02/02/2017 3:30 PM Medical Record Number: [id number] Patient Account Number: [id number] Date of Birth/Sex: 11/11/1923 (83 y.o. Male) Treating RN: [name] Primary Care Provider: [facility], Dossie Der Other Clinician: Referring Provider: [facility], [name] Treating Provider/Extender: Ricard Dillon Weeks in Treatment: 4 Constitutional Patient is hypertensive.. Pulse regular and within target range for patient.Marland Kitchen Respirations regular, non-labored and within target range.. Temperature is normal and within the target range for the patient.Marland Kitchen appears in no distress. Notes wound exam; patient's wound is then did not look infected. Surface of the wound did not require debridement. Still a small pair-shaped area that appears smaller. There is a small cyst next to this the exact etiology of this is unclear I don't believe this is a hematoma. Electronic Signature(s) Signed: 02/03/2017 8:14:14 AM By: Linton Ham MD Entered By: Linton Ham on 02/02/2017 17:39:09 [place], Eidson RoadMarland Kitchen ([id number]) -------------------------------------------------------------------------------- Physician Orders Details Patient Name: [name], [name] Date of Service: 02/02/2017 3:30 PM Medical Record Number:  [id number] Patient Account Number: [id number] Date of Birth/Sex: 1923-10-08 (83 y.o. Male) Treating RN: [name] Primary Care Provider: [facility], [name] Other  Clinician: Referring Provider: [facility], [name] Treating Provider/Extender: [name] in Treatment: 4 Verbal / Phone Orders: No Diagnosis Coding Wound Cleansing Wound #9 Head - Parietal o Clean wound with Normal Saline. Anesthetic (add to Medication List) Wound #9 Head - Parietal o Topical Lidocaine 4% cream applied to wound bed prior to debridement (In Clinic Only). Primary Wound Dressing Wound #9 Head - Parietal o Prisma Ag Secondary Dressing Wound #9 Head - Parietal o Other - coverlet Dressing Change Frequency Wound #9 Head - Parietal o Change dressing every other day. Follow-up Appointments o Return Appointment in 2 weeks. - Make appointment Jan, 2 Electronic Signature(s) Signed: 02/02/2017 4:37:46 PM By: [name] Signed: 02/03/2017 8:14:14 AM By: Linton Ham MD Entered By: [name] on 02/02/2017 15:50:41 [place], Tramaine L. ([id number]) -------------------------------------------------------------------------------- Problem List Details Patient Name: [name], [name] Date of Service: 02/02/2017 3:30 PM Medical Record Number: [id number] Patient Account Number: [id number] Date of Birth/Sex: 23-Jun-1923 (83 y.o. Male) Treating RN: [name] Primary Care Provider: [facility], Dossie Der Other Clinician: Referring Provider: [facility], [name] Treating Provider/Extender: [name] in Treatment: 4 Active Problems ICD-10 Encounter Code Description Active Date Diagnosis S01.01XD Laceration without foreign body of scalp, subsequent encounter 01/05/2017 Yes Inactive Problems Resolved Problems Electronic Signature(s) Signed: 02/03/2017 8:14:14 AM By: Linton Ham MD Entered By: Linton Ham on 02/02/2017 17:36:45 [name], [name] ([id number]) -------------------------------------------------------------------------------- Progress Note Details Patient Name: [name], [name] Date of Service: 02/02/2017 3:30 PM Medical Record Number:  [id number] Patient Account Number: [id number] Date of Birth/Sex: [birth year] (83 y.o. Male) Treating RN: [name] Primary Care Provider: [facility], [name] Other Clinician: Referring Provider: [name], [name] Treating Provider/Extender: [name] in Treatment: 4 Subjective History of Present Illness (HPI) The following HPI elements were documented for the patient's wound: Location: right elbow, left knee, left upper arm and left upper back Quality: Patient reports experiencing a dull pain to affected area(s). Severity: Patient states wound (s) are getting better. Duration: Patient has had the wound for < 1 weeks prior to presenting for treatment Timing: Pain in wound is Intermittent (comes and goes Context: The wound occurred when the patient had a fall and hurt his back Modifying Factors: Other treatment(s) tried include:local care with Neosporin ointment Associated Signs and Symptoms: Patient reports having:scar tissue opens out every time they do dressing 83 year old patient who was in [place] in May a couple of times for a lacerated wound which was healed over a couple of weeks. He now comes with recent injuries to his back and knees and both arms sustained after a fall. He did not seek any medical help and they use dressing material which was previously there. Past medical history significant for carotid stenosis, [name], [name], dyslipidemia, hypertension, anxiety, GERD, multiple myeloma, BPH, inguinal hernia and is status post appendectomy, hip fracture, cardiac catheterization. He smoked in the remote past and has not smoked for several years. He has had long-term low back pain and was evaluated for pain radiating to the left groin. 02-06-16 Mr. Alred progress for evaluation of multiple skin tears, he is accompanied by his wife. Neither patient nor his wife expressed the concerns regarding his wounds and/or the treatment plan. They deny any falls since his last appointment  or any new injuries. READMISSION this is a 83 year old man who is been seen in the clinic in 2017 for multiple skin  tears after falling. These were fairly easy to close over unfortunately the patient's recent course is not gone well. He fell and suffered a right hip fracture at the beginning of March. He was discharged to a skilled facility and sometime during the stay at the skilled facility his daughter noted a bandage on his left elbow. It wasn't told later that they learned he had fallen out of bed sustaining an injury. After discharge a week ago he apparently had a off-balance issue falling against the wall and hit the left elbow again. Been using peroxide and Neosporin. Seen by the family doctor yesterday and x-ray of the left elbow was negative. He was put on doxycycline. The patient has a history of coronary artery disease status post stent, [name], hypertension, lumbar spinal stenosis, multiple myeloma and BPH. He is apparently had bilateral hip replacements secondary to falling with the most recent one being on the right. He states that on the times he has fallen he has got up to go to the bathroom and becomes "swimmy headed" although he does not describe the off-balance sensation were reproducibly with changes in position 06/10/16; left elbow wounds look improved this is on the posterior aspect over the olecranon area. Healthy-looking granulation. The Prisma apparently stuck to the wound bed at least as being applied by home health. We changed to [facility]. 06/17/16; the areas over the left elbow look improved. There are 2 small open areas. Light surface debridement to remove surface eschar. 06/24/16; left elbow as predicted last week has epithelialized as predicted HOWEVER today he arrives with blisters in a perfect distribution limited to the wound. 07/01/16; the entire area of his elbow looks better this week. Only one open area remains 07/08/16; the open areas around his left  elbow look better. This is a frail man with a history of falls. He has multiple myeloma by history and lumbar spinal stenosis. He uses a walker. He has actinic keratosis, actinic purpura and xerotic skin. He is [place], [place] ([id number]) severely damaged skin on his forearms READMISSION 01/05/17; this is a elderly man that we've had in clinic multiple times most recently in the earlier part of this year for a wound on the left and olecranon area which was traumatic. His current problem started on 12/05/16 when he sustained a fall with a large scalp laceration over the occiput. This was repaired with several staples and stitches. There was bleeding noted on 11/2 from the wound which was not observed in the ER and he was sent home. His sutures were removed on 12/20/16 however he was seen again on 12/21/16 in the ER with bleeding from the wound. At that time there was extensive debridement of granulation tissue and redundant skin flap was performed to approximate skin borders. The wound was stapled. He was referred back to his primary doctor for removal of the staples however the primary doctor sent him here. In the meantime he has undergone a kyphoplasty at 12/30/16 of T10 looking through [place] 01/12/17; patient with a large laceration on the occiput of his scalp. I removed his stitches last week and there was still open areas remaining. We've been using silver alginate. He is doing well 01/19/17 on evaluation today patient appears to be doing fairly well in regard to his scalp wound. He has been tolerating the dressing changes without complication at this point. His wife performs the dressing changes for him. No fevers, chills, nausea, or vomiting noted at this time. 01/27/17; this patient's  wound is larger than I would've thought. Pear-shaped wound on the occiput. He has been using silver alginate. Per our intake nurse he arrived with a large scab over the wound which peeled off. There was some  purulent looking drainage. 02/02/17; this patient's wound is still open although it is smaller. Still a cystlike area next to this which they asked me about every time Objective Constitutional Patient is hypertensive.. Pulse regular and within target range for patient.Marland Kitchen Respirations regular, non-labored and within target range.. Temperature is normal and within the target range for the patient.Marland Kitchen appears in no distress. Vitals Time Taken: 3:18 AM, Height: 70 in, Weight: 181 lbs, BMI: 26, Temperature: 97.6 F, Pulse: 71 bpm, Respiratory Rate: 20 breaths/min, Blood Pressure: 141/57 mmHg. General Notes: wound exam; patient's wound is then did not look infected. Surface of the wound did not require debridement. Still a small pair-shaped area that appears smaller. There is a small cyst next to this the exact etiology of this is unclear I don't believe this is a hematoma. Integumentary (Hair, Skin) Wound #9 status is Open. Original cause of wound was Trauma. The wound is located on the Head - Parietal. The wound measures 0.5cm length x 0.4cm width x 0.1cm depth; 0.157cm^2 area and 0.016cm^3 volume. There is no tunneling or undermining noted. There is a small amount of serous drainage noted. The wound margin is flat and intact. There is large (67- 100%) red granulation within the wound bed. There is no necrotic tissue within the wound bed. The periwound skin appearance exhibited: Dry/Scaly. The periwound skin appearance did not exhibit: Callus, Crepitus, Excoriation, Induration, Rash, Scarring, Maceration, Atrophie Blanche, Cyanosis, Ecchymosis, Hemosiderin Staining, Mottled, Pallor, Rubor, Erythema. [name], [name] ([id number]) Assessment Active Problems ICD-10 S01.01XD - Laceration without foreign body of scalp, subsequent encounter Plan Wound Cleansing: Wound #9 Head - Parietal: Clean wound with Normal Saline. Anesthetic (add to Medication List): Wound #9 Head - Parietal: Topical Lidocaine  4% cream applied to wound bed prior to debridement (In Clinic Only). Primary Wound Dressing: Wound #9 Head - Parietal: Prisma Ag Secondary Dressing: Wound #9 Head - Parietal: Other - coverlet Dressing Change Frequency: Wound #9 Head - Parietal: Change dressing every other day. Follow-up Appointments: Return Appointment in 2 weeks. - Make appointment Jan, 2 #1 we use Prisma still with a surface covering. I hope that this will bring this together Electronic Signature(s) Signed: 02/03/2017 8:14:14 AM By: Linton Ham MD Entered By: Linton Ham on 02/02/2017 17:39:38 [employer], [place]. ([id number]) -------------------------------------------------------------------------------- SuperBill Details Patient Name: [name], [name] Date of Service: 02/02/2017 Medical Record Number: [id number] Patient Account Number: [id number] Date of Birth/Sex: [birth year] (83 y.o. Male) Treating RN: [name] Primary Care Provider: [facility], [name] Other Clinician: Referring Provider: [facility], [name] Treating Provider/Extender: Ricard Dillon Weeks in Treatment: 4 Diagnosis Coding ICD-10 Codes Code Description S01.01XD Laceration without foreign body of scalp, subsequent encounter Facility Procedures CPT4 Code: [id number] Description: [phone] - WOUND CARE VISIT-LEV 2 EST PT Modifier: Quantity: 1 Physician Procedures CPT4 Code: [id number] Description: 22025 - WC PHYS LEVEL 2 - EST PT ICD-10 Diagnosis Description S01.01XD Laceration without foreign body of scalp, subsequent enc Modifier: ounter Quantity: 1 Electronic Signature(s) Signed: 02/03/2017 8:14:14 AM By: Linton Ham MD Entered By: Linton Ham on 02/02/2017 17:39:51

## 2017-02-11 ENCOUNTER — Other Ambulatory Visit: Payer: Self-pay | Admitting: Orthopedic Surgery

## 2017-02-11 DIAGNOSIS — S300XXA Contusion of lower back and pelvis, initial encounter: Secondary | ICD-10-CM

## 2017-02-11 DIAGNOSIS — M545 Low back pain: Secondary | ICD-10-CM

## 2017-02-15 ENCOUNTER — Ambulatory Visit (INDEPENDENT_AMBULATORY_CARE_PROVIDER_SITE_OTHER): Payer: Medicare Other | Admitting: Family Medicine

## 2017-02-15 ENCOUNTER — Encounter: Payer: Self-pay | Admitting: Family Medicine

## 2017-02-15 ENCOUNTER — Other Ambulatory Visit: Payer: Self-pay

## 2017-02-15 VITALS — BP 134/64 | HR 99 | Temp 98.2°F | Resp 18 | Wt 188.9 lb

## 2017-02-15 DIAGNOSIS — I2510 Atherosclerotic heart disease of native coronary artery without angina pectoris: Secondary | ICD-10-CM | POA: Diagnosis not present

## 2017-02-15 DIAGNOSIS — I10 Essential (primary) hypertension: Secondary | ICD-10-CM

## 2017-02-15 DIAGNOSIS — R062 Wheezing: Secondary | ICD-10-CM | POA: Diagnosis not present

## 2017-02-15 DIAGNOSIS — F411 Generalized anxiety disorder: Secondary | ICD-10-CM | POA: Diagnosis not present

## 2017-02-15 DIAGNOSIS — E7800 Pure hypercholesterolemia, unspecified: Secondary | ICD-10-CM | POA: Diagnosis not present

## 2017-02-15 DIAGNOSIS — N400 Enlarged prostate without lower urinary tract symptoms: Secondary | ICD-10-CM

## 2017-02-15 MED ORDER — ISOSORBIDE MONONITRATE ER 30 MG PO TB24: 30.0000 mg | ORAL_TABLET | Freq: Every day | ORAL | 0 refills | Status: AC

## 2017-02-15 MED ORDER — DOXAZOSIN MESYLATE 1 MG PO TABS: 1.0000 mg | ORAL_TABLET | Freq: Every day | ORAL | 0 refills | Status: DC

## 2017-02-15 MED ORDER — CLONAZEPAM 0.5 MG PO TABS: 0.5000 mg | ORAL_TABLET | Freq: Three times a day (TID) | ORAL | 0 refills | Status: DC | PRN

## 2017-02-15 MED ORDER — PRAVASTATIN SODIUM 40 MG PO TABS: 40.0000 mg | ORAL_TABLET | Freq: Every day | ORAL | 0 refills | Status: DC

## 2017-02-15 NOTE — Progress Notes (Signed)
Name: [name]   MRN: [id number]    DOB: [birth year]   Date:02/15/2017       Progress Note  Subjective  Chief Complaint  Chief Complaint  Patient presents with  . Medication Refill  . Hypotension    Patient's blood pressure is also below normal at 100/48 mmHg. metoprolol decrease, patient was symptomatic at his last visit   . Fall    Pt last week and has a bruse on his back but pt wife using biofreeze which woound care gave them. Pt has an apt with wound care on 02/18/2016 and MRI wenesday     Hypertension  This is a chronic problem. The problem is unchanged. The problem is controlled. Pertinent negatives include no blurred vision, chest pain, headaches, palpitations or shortness of breath. Past treatments include beta blockers. Hypertensive end-organ damage includes [name]/MI.  Hyperlipidemia  This is a chronic problem. The problem is controlled. Recent lipid tests were reviewed and are normal. Pertinent negatives include no chest pain, leg pain, myalgias or shortness of breath. Current antihyperlipidemic treatment includes statins.    Past Medical History:  Diagnosis Date  . Anginal pain ([place])    c/o heaviness a few years ago.  stent placed and all resolved  . Anxiety   . BPH (benign prostatic hypertrophy)   . Carotid stenosis    a. 05/2015 Carotid U/S: mild nonobs atherosclerosis. No need for f/u.  Marland Kitchen Chronic back pain   . Coronary artery disease    a. 06/2013 Cath/PCI: LAD 50-70p (FFR 0.82-->PCI w/ 3.5x24 Promus DES), RCA subtotally occluded w/ L->R collats, LCX 30ost.  . Dyslipidemia   . GERD (gastroesophageal reflux disease)   . Hx of [name]   . Hyperlipidemia   . Hypertension   . Inguinal hernia   . Lightheadedness    a. chronic, somewhat positional.  . 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 Major depression   . Pleural effusion    left  . Sleep apnea    will not use cpap    Past Surgical  History:  Procedure Laterality Date  . ANTERIOR APPROACH HEMI HIP ARTHROPLASTY Right 04/16/2016   Procedure: ANTERIOR APPROACH HEMI HIP ARTHROPLASTY;  Surgeon: Hessie Knows, MD;  Location: [facility] ORS;  Service: Orthopedics;  Laterality: Right;  . APPENDECTOMY     83 years old  . arm fracture Right 2008   rod in arm  . CARDIAC CATHETERIZATION  2002  . CARDIAC CATHETERIZATION  06/2013   stent placed  . COLONOSCOPY    . EYE SURGERY Bilateral 2008   cataract extraction  . FEMUR FRACTURE SURGERY Left    with rod  . HEMORROIDECTOMY     77 years ago  . HIP FRACTURE SURGERY    . JOINT REPLACEMENT Bilateral 03/2016   right partial knee replacement, bilateral THR  . KYPHOPLASTY N/A 12/30/2016   Procedure: [id number];  Surgeon: Hessie Knows, MD;  Location: [facility] ORS;  Service: Orthopedics;  Laterality: N/A;    Family History  Problem Relation Age of Onset  . Heart disease Mother   . Stroke Mother   . Diabetes Father   . Dementia Father   . Stroke Sister   . Stroke Brother   . Stroke Sister   . Stroke Sister   . Stroke Brother   . Stroke Brother     Social History   Socioeconomic History  . Marital status: Married  Spouse name: Not on file  . Number of children: Not on file  . Years of education: Not on file  . Highest education level: Not on file  Social Needs  . Financial resource strain: Not on file  . Food insecurity - worry: Not on file  . Food insecurity - inability: Not on file  . Transportation needs - medical: Not on file  . Transportation needs - non-medical: Not on file  Occupational History  . Not on file  Tobacco Use  . Smoking status: Former Smoker    Packs/day: 2.00    Years: 7.00    Pack years: 14.00    Types: Cigarettes    Last attempt to quit: 09/17/1953    Years since quitting: 63.4  . Smokeless tobacco: Never Used  Substance and Sexual Activity  . Alcohol use: No    Alcohol/week: 0.0 oz  . Drug use: No  . Sexual activity: Not Currently    Other Topics Concern  . Not on file  Social History Narrative  . Not on file     Current Outpatient Medications:  .  albuterol (PROVENTIL HFA;VENTOLIN HFA) 108 (90 Base) MCG/ACT inhaler, Inhale 2 puffs every 6 (six) hours as needed into the lungs for wheezing or shortness of breath., Disp: 1 Inhaler, Rfl: 2 .  clonazePAM (KLONOPIN) 0.5 MG tablet, Take 1 tablet (0.5 mg total) by mouth 3 (three) times daily as needed for anxiety., Disp: 90 tablet, Rfl: 2 .  doxazosin (CARDURA) 1 MG tablet, Take 1 tablet (1 mg total) by mouth daily., Disp: 90 tablet, Rfl: 0 .  furosemide (LASIX) 40 MG tablet, Take 1 tablet (40 mg total) 2 (two) times daily by mouth. 40 mg p.o. twice daily for 3 days followed by 40 mg p.o. daily, Disp: 60 tablet, Rfl: 11 .  HYDROcodone-acetaminophen (NORCO/VICODIN) 5-325 MG tablet, Take 1 tablet by mouth every 6 (six) hours as needed. Pain, Disp: , Rfl:  .  isosorbide mononitrate (IMDUR) 30 MG 24 hr tablet, Take 1 tablet (30 mg total) by mouth daily., Disp: 90 tablet, Rfl: 1 .  metoprolol tartrate (LOPRESSOR) 25 MG tablet, Take 1-2 tablets (25-50 mg total) 2 (two) times daily by mouth. Take 2 tablets (50mg ) in the morning and 1 tablet (25mg ) at bedtime, Disp: 270 tablet, Rfl: 0 .  Multiple Vitamins-Minerals (PRESERVISION/LUTEIN PO), Take by mouth 2 (two) times daily., Disp: , Rfl:  .  nitroGLYCERIN (NITROSTAT) 0.4 MG SL tablet, Place 0.4 mg under the tongue every 5 (five) minutes as needed for chest pain., Disp: , Rfl:  .  pantoprazole (PROTONIX) 40 MG tablet, Take 1 tablet (40 mg total) daily by mouth., Disp: 90 tablet, Rfl: 0 .  polyethylene glycol (MIRALAX / GLYCOLAX) packet, Take 17 g by mouth daily., Disp: , Rfl:  .  pravastatin (PRAVACHOL) 40 MG tablet, Take 1 tablet (40 mg total) by mouth daily. (Patient taking differently: Take 40 mg daily by mouth. ), Disp: 90 tablet, Rfl: 0 .  Saccharomyces boulardii (PROBIOTIC) 250 MG CAPS, Take 1 tablet daily at 6 (six) AM by mouth.,  Disp: 14 capsule, Rfl: 1 .  sertraline (ZOLOFT) 100 MG tablet, Take 1.5 tablets (150 mg total) by mouth at bedtime., Disp: 135 tablet, Rfl: 0 .  traMADol (ULTRAM) 50 MG tablet, Take 25 mg 3 (three) times daily by mouth. , Disp: , Rfl:  .  acetaminophen (TYLENOL) 500 MG tablet, Take 2 tablets (1,000 mg total) by mouth every 6 (six) hours. (Patient not taking: Reported  on 02/15/2017), Disp: 30 tablet, Rfl: 0 .  azithromycin (ZITHROMAX) 250 MG tablet, Daily for 4 days (Patient not taking: Reported on 01/14/2017), Disp: 4 each, Rfl: 0 .  bacitracin 500 UNIT/GM ointment, Apply 1 application topically 2 (two) times daily. (Patient not taking: Reported on 01/14/2017), Disp: 15 g, Rfl: 0 .  lidocaine (LIDODERM) 5 %, Place 1 patch onto the skin daily. Remove & Discard patch within 12 hours or as directed by MD (Patient not taking: Reported on 02/15/2017), Disp: 30 patch, Rfl: 1  Allergies  Allergen Reactions  . Cyclobenzaprine Other (See Comments)    Hallucination. Mental instability.  DO NOT GIVE ANY MUSCLE RELAXANTS  . Baclofen Other (See Comments)    Confusion and weakness  . Nsaids     Avoids due to liver.  . Sulfamethoxazole-Trimethoprim Nausea Only and Other (See Comments)    Patient unaware of this as an allergy  . Tolmetin Other (See Comments)    Avoids due to liver.it is an nsaid     Review of Systems  Eyes: Negative for blurred vision.  Respiratory: Negative for shortness of breath.   Cardiovascular: Negative for chest pain and palpitations.  Musculoskeletal: Negative for myalgias.  Neurological: Negative for headaches.     Objective  Vitals:   02/15/17 1047  BP: 134/64  Pulse: 99  Resp: 18  Temp: 98.2 F (36.8 C)  TempSrc: Oral  SpO2: 95%  Weight: 188 lb 14.4 oz (85.7 kg)    Physical Exam  Constitutional: He is well-developed, well-nourished, and in no distress.  HENT:  Head: Normocephalic and atraumatic.  Cardiovascular: Normal rate, regular rhythm, S1 normal and  S2 normal.  Murmur heard.  Systolic murmur is present with a grade of 2/6. Pulmonary/Chest: Effort normal. No respiratory distress. He has wheezes in the right middle field, the right lower field, the left middle field and the left lower field.  Abdominal: Soft. Bowel sounds are normal. There is no tenderness.  Musculoskeletal:       Arms: Hematoma on the left flank region, non tender to palpation. Pt. Scheduled for follow up with Orthopedics.  Nursing note and vitals reviewed.      Assessment & Plan  1. Essential hypertension BP stable on present antihypertensive treatment, now taking metoprolol 25 mg twice a day  2. Hypercholesteremia Patient has not obtained [place] ordered in June 2018, his wife reports difficulty in bringing patient to his appointments, he continues on statin and would make no changes at this time, recheck [place] next visit  3. Expiratory wheezing In the past, patient has been diagnosed with pleural effusion, he is asymptomatic from a pulmonology standpoint, denies any dyspnea, oxygen saturation are normal, will refer to pulmonology for management - Ambulatory referral to Pulmonology  4. Generalized anxiety disorder Symptoms of anxiety are stable on clonazepam taken up to 3 times daily as needed - clonazePAM (KLONOPIN) 0.5 MG tablet; Take 1 tablet (0.5 mg total) by mouth 3 (three) times daily as needed for anxiety.  Dispense: 90 tablet; Refill: 0 - Ambulatory referral to Psychiatry  Alyx Mcguirk [facility] 02/15/2017 11:32 AM

## 2017-02-17 ENCOUNTER — Encounter: Payer: Medicare Other | Attending: Internal Medicine | Admitting: Internal Medicine

## 2017-02-17 DIAGNOSIS — S81011D Laceration without foreign body, right knee, subsequent encounter: Secondary | ICD-10-CM | POA: Diagnosis not present

## 2017-02-17 DIAGNOSIS — G510 Bell's palsy: Secondary | ICD-10-CM | POA: Diagnosis not present

## 2017-02-17 DIAGNOSIS — Z87891 Personal history of nicotine dependence: Secondary | ICD-10-CM | POA: Diagnosis not present

## 2017-02-17 DIAGNOSIS — I2510 Atherosclerotic heart disease of native coronary artery without angina pectoris: Secondary | ICD-10-CM | POA: Insufficient documentation

## 2017-02-17 DIAGNOSIS — F419 Anxiety disorder, unspecified: Secondary | ICD-10-CM | POA: Diagnosis not present

## 2017-02-17 DIAGNOSIS — S0101XD Laceration without foreign body of scalp, subsequent encounter: Secondary | ICD-10-CM | POA: Diagnosis present

## 2017-02-17 DIAGNOSIS — I6529 Occlusion and stenosis of unspecified carotid artery: Secondary | ICD-10-CM | POA: Diagnosis not present

## 2017-02-17 DIAGNOSIS — W06XXXD Fall from bed, subsequent encounter: Secondary | ICD-10-CM | POA: Diagnosis not present

## 2017-02-17 DIAGNOSIS — Z955 Presence of coronary angioplasty implant and graft: Secondary | ICD-10-CM | POA: Insufficient documentation

## 2017-02-17 DIAGNOSIS — E785 Hyperlipidemia, unspecified: Secondary | ICD-10-CM | POA: Diagnosis not present

## 2017-02-17 DIAGNOSIS — K219 Gastro-esophageal reflux disease without esophagitis: Secondary | ICD-10-CM | POA: Insufficient documentation

## 2017-02-17 DIAGNOSIS — I10 Essential (primary) hypertension: Secondary | ICD-10-CM | POA: Diagnosis not present

## 2017-02-18 ENCOUNTER — Ambulatory Visit
Admission: RE | Admit: 2017-02-18 | Discharge: 2017-02-18 | Disposition: A | Discharge status: Home / Self Care | Payer: Medicare Other | Source: Ambulatory Visit | Attending: Orthopedic Surgery | Admitting: Orthopedic Surgery

## 2017-02-18 DIAGNOSIS — S300XXA Contusion of lower back and pelvis, initial encounter: Secondary | ICD-10-CM | POA: Insufficient documentation

## 2017-02-18 DIAGNOSIS — X58XXXA Exposure to other specified factors, initial encounter: Secondary | ICD-10-CM | POA: Diagnosis not present

## 2017-02-18 DIAGNOSIS — M545 Low back pain: Secondary | ICD-10-CM | POA: Insufficient documentation

## 2017-02-18 DIAGNOSIS — R600 Localized edema: Secondary | ICD-10-CM | POA: Insufficient documentation

## 2017-02-18 DIAGNOSIS — M899 Disorder of bone, unspecified: Secondary | ICD-10-CM | POA: Diagnosis not present

## 2017-02-18 NOTE — Progress Notes (Signed)
[name] ([id number]) Visit Report for 02/17/2017 Debridement Details Patient Name: [name], [name] Date of Service: 02/17/2017 3:30 PM Medical Record Number: [id number] Patient Account Number: [id number] Date of Birth/Sex: 1923-02-28 (84 y.o. Male) Treating RN: [name] Primary Care Provider: [facility], [name] Other Clinician: Referring Provider: [facility], [name] Treating Provider/Extender: [name] in Treatment: 6 Debridement Performed for Wound #9 Head - Parietal Assessment: Performed By: Physician Brandon Dillon, MD Debridement: Debridement Pre-procedure Verification/Time Yes - 03:58 Out Taken: Start Time: 03:58 Pain Control: Other : lidocaine 4% Level: Skin/Subcutaneous Tissue Total Area Debrided (L x W): 0.5 (cm) x 0.6 (cm) = 0.3 (cm) Tissue and other material Viable, Non-Viable, Fibrin/Slough, Skin, Subcutaneous debrided: Instrument: Curette Bleeding: Minimum Hemostasis Achieved: Pressure End Time: 04:00 Procedural Pain: 0 Post Procedural Pain: 0 Response to Treatment: Procedure was tolerated well Post Debridement Measurements of Total Wound Length: (cm) 0.5 Width: (cm) 0.6 Depth: (cm) 0.1 Volume: (cm) 0.024 Character of Wound/Ulcer Post Debridement: Stable Post Procedure Diagnosis Same as Pre-procedure Electronic Signature(s) Signed: 02/17/2017 5:48:27 PM By: Brandon Ham MD Signed: 02/18/2017 8:52:32 AM By: [name] Previous Signature: 02/17/2017 5:08:51 PM Version By: [name] Entered By: [name] on 02/17/2017 17:29:15 [place], [name] ([id number]) -------------------------------------------------------------------------------- HPI Details Patient Name: [name], [name] Date of Service: 02/17/2017 3:30 PM Medical Record Number: [id number] Patient Account Number: [id number] Date of Birth/Sex: [birth year] (84 y.o. Male) Treating RN: [name] Primary Care Provider: [facility], [name] Other Clinician: Referring Provider: [name],  [name] Treating Provider/Extender: [name] in Treatment: 6 History of Present Illness Location: right elbow, left knee, left upper arm and left upper back Quality: Patient reports experiencing a dull pain to affected area(s). Severity: Patient states wound (s) are getting better. Duration: Patient has had Brandon wound for < 1 weeks prior to presenting for treatment Timing: Pain in wound is Intermittent (comes and goes Context: Brandon wound occurred when Brandon patient had a fall and hurt his back Modifying Factors: Other treatment(s) tried include:local care with Neosporin ointment Associated Signs and Symptoms: Patient reports having:scar tissue opens out every time they do dressing HPI Description: 84 year old patient who was in [place] in May a couple of times for a lacerated wound which was healed over a couple of weeks. He now comes with recent injuries to his back and knees and both arms sustained after a fall. He did not seek any medical help and they use dressing material which was previously there. Past medical history significant for carotid stenosis, [name], [name], dyslipidemia, hypertension, anxiety, GERD, multiple myeloma, BPH, inguinal hernia and is status post appendectomy, hip fracture, cardiac catheterization. He smoked in Brandon remote past and has not smoked for several years. He has had long-term low back pain and was evaluated for pain radiating to Brandon left groin. 02-06-16 Mr. [name] progress for evaluation of multiple skin tears, he is accompanied by his wife. Neither patient nor his wife expressed Brandon concerns regarding his wounds and/or Brandon treatment plan. They deny any falls since his last appointment or any new injuries. READMISSION this is a 84 year old man who is been seen in Brandon clinic in 2017 for multiple skin tears after falling. These were fairly easy to close over unfortunately Brandon patient's recent course is not gone well. He fell and suffered a right hip  fracture at Brandon beginning of March. He was discharged to a skilled facility and sometime during Brandon stay at Brandon skilled facility his daughter noted a bandage on his left elbow.  It wasn't told later that they learned he had fallen out of bed sustaining an injury. After discharge a week ago he apparently had a off-balance issue falling against Brandon wall and hit Brandon left elbow again. Been using peroxide and Neosporin. Seen by Brandon family doctor yesterday and x-ray of Brandon left elbow was negative. He was put on doxycycline. Brandon patient has a history of coronary artery disease status post stent, [name], hypertension, lumbar spinal stenosis, multiple myeloma and BPH. He is apparently had bilateral hip replacements secondary to falling with Brandon most recent one being on Brandon right. He states that on Brandon times he has fallen he has got up to go to Brandon bathroom and becomes "swimmy headed" although he does not describe Brandon off-balance sensation were reproducibly with changes in position 06/10/16; left elbow wounds look improved this is on Brandon posterior aspect over Brandon olecranon area. Healthy-looking granulation. Brandon Prisma apparently stuck to Brandon wound bed at least as being applied by home health. We changed to [facility]. 06/17/16; Brandon areas over Brandon left elbow look improved. There are 2 small open areas. Light surface debridement to remove surface eschar. 06/24/16; left elbow as predicted last week has epithelialized as predicted HOWEVER today he arrives with blisters in a perfect distribution limited to Brandon wound. 07/01/16; Brandon entire area of his elbow looks better this week. Only one open area remains 07/08/16; Brandon open areas around his left elbow look better. This is a frail man with a history of falls. He has multiple myeloma by history and lumbar spinal stenosis. He uses a walker. He has actinic keratosis, actinic purpura and xerotic skin. He is severely damaged skin on his forearms READMISSION [name],  [name] ([id number]) 01/05/17; this is a elderly man that we've had in clinic multiple times most recently in Brandon earlier part of this year for a wound on Brandon left and olecranon area which was traumatic. His current problem started on 12/05/16 when he sustained a fall with a large scalp laceration over Brandon occiput. This was repaired with several staples and stitches. There was bleeding noted on 11/2 from Brandon wound which was not observed in Brandon ER and he was sent home. His sutures were removed on 12/20/16 however he was seen again on 12/21/16 in Brandon ER with bleeding from Brandon wound. At that time there was extensive debridement of granulation tissue and redundant skin flap was performed to approximate skin borders. Brandon wound was stapled. He was referred back to his primary doctor for removal of Brandon staples however Brandon primary doctor sent him here. In Brandon meantime he has undergone a kyphoplasty at 12/30/16 of T10 looking through [place] 01/12/17; patient with a large laceration on Brandon occiput of his scalp. I removed his stitches last week and there was still open areas remaining. We've been using silver alginate. He is doing well 01/19/17 on evaluation today patient appears to be doing fairly well in regard to his scalp wound. He has been tolerating Brandon dressing changes without complication at this point. His wife performs Brandon dressing changes for him. No fevers, chills, nausea, or vomiting noted at this time. 01/27/17; this patient's wound is larger than I would've thought. Pear-shaped wound on Brandon occiput. He has been using silver alginate. Per our intake nurse he arrived with a large scab over Brandon wound which peeled off. There was some purulent looking drainage. 02/02/17; this patient's wound is still open although it is smaller. Still a cystlike area next to  this which they asked me about every time 02/17/17; not much change in Brandon overall wound area on this patient's occipital scalp. Cystlike area next to  this is unchanged. We have been using collagen. This was supposedly traumatic after a large laceration. He is also had another fall last week with a superficial skin tear on Brandon right upper thigh Electronic Signature(s) Signed: 02/17/2017 5:48:27 PM By: Brandon Ham MD Entered By: [name] on 02/17/2017 17:31:46 [place], Huy L. ([id number]) -------------------------------------------------------------------------------- Physical Exam Details Patient Name: [name], [name] Date of Service: 02/17/2017 3:30 PM Medical Record Number: [id number] Patient Account Number: [id number] Date of Birth/Sex: Dec 25, 1923 (84 y.o. Male) Treating RN: [name] Primary Care Provider: [facility], Dossie Der Other Clinician: Referring Provider: [facility], [name] Treating Provider/Extender: [name] Weeks in Treatment: 6 Constitutional Sitting or standing Blood Pressure is within target range for patient.. Pulse regular and within target range for patient.Marland Kitchen Respirations regular, non-labored and within target range.. Temperature is normal and within Brandon target range for Brandon patient.Marland Kitchen appears in no distress. Notes wound exam; Brandon area on Brandon occipital scalp does not look infected however Brandon surface of Brandon wound required debridement not a healthy heeling surface. Brandon on this I am not really sure why we are having so much difficulty here. oPatient has a new superficial skin tear on Brandon right anterior thigh just above Brandon knee. This does not look ominous Electronic Signature(s) Signed: 02/17/2017 5:48:27 PM By: Brandon Ham MD Entered By: [name] on 02/17/2017 17:33:07 [place], [place]. ([id number]) -------------------------------------------------------------------------------- Physician Orders Details Patient Name: [name], [name] Date of Service: 02/17/2017 3:30 PM Medical Record Number: [id number] Patient Account Number: [id number] Date of Birth/Sex: 07-05-1923 (84 y.o. Male) Treating RN: [name] Primary Care Provider: [facility], [name] Other Clinician: Referring Provider: [facility], [name] Treating Provider/Extender: [name] in Treatment: 6 Verbal / Phone Orders: No Diagnosis Coding Wound Cleansing Wound #10 Right Knee o Clean wound with Normal Saline. Wound #9 Head - Parietal o Clean wound with Normal Saline. Anesthetic (add to Medication List) Wound #9 Head - Parietal o Topical Lidocaine 4% cream applied to wound bed prior to debridement (In Clinic Only). Primary Wound Dressing Wound #9 Head - Parietal o Prisma Ag Wound #10 Right Knee o Other: - triple antibiotic cream Secondary Dressing Wound #10 Right Knee o Other - coverlet Wound #9 Head - Parietal o Other - coverlet Dressing Change Frequency Wound #9 Head - Parietal o Change dressing every other day. Follow-up Appointments o Return Appointment in 2 weeks. Electronic Signature(s) Signed: 02/17/2017 5:08:51 PM By: [name] Signed: 02/17/2017 5:48:27 PM By: Brandon Ham MD Entered By: [name] on 02/17/2017 16:12:15 [place], Jiyaan L. ([id number]) -------------------------------------------------------------------------------- Problem List Details Patient Name: [name], [name] Date of Service: 02/17/2017 3:30 PM Medical Record Number: [id number] Patient Account Number: [id number] Date of Birth/Sex: Jan 22, 1924 (84 y.o. Male) Treating RN: [name] Primary Care Provider: [facility], Dossie Der Other Clinician: Referring Provider: [facility], [name] Treating Provider/Extender: [name] in Treatment: 6 Active Problems ICD-10 Encounter Code Description Active Date Diagnosis S01.01XD Laceration without foreign body of scalp, subsequent encounter 01/05/2017 Yes S81.011D Laceration without foreign body, right knee, subsequent encounter 02/17/2017 Yes Inactive Problems Resolved Problems Electronic Signature(s) Signed: 02/17/2017 5:48:27 PM By: Brandon Ham MD Entered By:  [name] on 02/17/2017 17:28:57 [place], [place] ([id number]) -------------------------------------------------------------------------------- Progress Note Details Patient Name: [name], [name] Date of Service: 02/17/2017 3:30 PM Medical Record Number: [id number] Patient Account Number: [id number] Date of Birth/Sex: 04/20/1923 (  84 y.o. Male) Treating RN: [name] Primary Care Provider: [facility], [name] Other Clinician: Referring Provider: [facility], [name] Treating Provider/Extender: [name] in Treatment: 6 Subjective History of Present Illness (HPI) Brandon following HPI elements were documented for Brandon patient's wound: Location: right elbow, left knee, left upper arm and left upper back Quality: Patient reports experiencing a dull pain to affected area(s). Severity: Patient states wound (s) are getting better. Duration: Patient has had Brandon wound for < 1 weeks prior to presenting for treatment Timing: Pain in wound is Intermittent (comes and goes Context: Brandon wound occurred when Brandon patient had a fall and hurt his back Modifying Factors: Other treatment(s) tried include:local care with Neosporin ointment Associated Signs and Symptoms: Patient reports having:scar tissue opens out every time they do dressing 84 year old patient who was in [place] in May a couple of times for a lacerated wound which was healed over a couple of weeks. He now comes with recent injuries to his back and knees and both arms sustained after a fall. He did not seek any medical help and they use dressing material which was previously there. Past medical history significant for carotid stenosis, [name], [name], dyslipidemia, hypertension, anxiety, GERD, multiple myeloma, BPH, inguinal hernia and is status post appendectomy, hip fracture, cardiac catheterization. He smoked in Brandon remote past and has not smoked for several years. He has had long-term low back pain and was evaluated for pain radiating to Brandon  left groin. 02-06-16 Mr. Swoboda progress for evaluation of multiple skin tears, he is accompanied by his wife. Neither patient nor his wife expressed Brandon concerns regarding his wounds and/or Brandon treatment plan. They deny any falls since his last appointment or any new injuries. READMISSION this is a 84 year old man who is been seen in Brandon clinic in 2017 for multiple skin tears after falling. These were fairly easy to close over unfortunately Brandon patient's recent course is not gone well. He fell and suffered a right hip fracture at Brandon beginning of March. He was discharged to a skilled facility and sometime during Brandon stay at Brandon skilled facility his daughter noted a bandage on his left elbow. It wasn't told later that they learned he had fallen out of bed sustaining an injury. After discharge a week ago he apparently had a off-balance issue falling against Brandon wall and hit Brandon left elbow again. Been using peroxide and Neosporin. Seen by Brandon family doctor yesterday and x-ray of Brandon left elbow was negative. He was put on doxycycline. Brandon patient has a history of coronary artery disease status post stent, [name], hypertension, lumbar spinal stenosis, multiple myeloma and BPH. He is apparently had bilateral hip replacements secondary to falling with Brandon most recent one being on Brandon right. He states that on Brandon times he has fallen he has got up to go to Brandon bathroom and becomes "swimmy headed" although he does not describe Brandon off-balance sensation were reproducibly with changes in position 06/10/16; left elbow wounds look improved this is on Brandon posterior aspect over Brandon olecranon area. Healthy-looking granulation. Brandon Prisma apparently stuck to Brandon wound bed at least as being applied by home health. We changed to [facility]. 06/17/16; Brandon areas over Brandon left elbow look improved. There are 2 small open areas. Light surface debridement to remove surface eschar. 06/24/16; left elbow as predicted  last week has epithelialized as predicted HOWEVER today he arrives with blisters in a perfect distribution limited to Brandon wound. 07/01/16; Brandon entire area  of his elbow looks better this week. Only one open area remains 07/08/16; Brandon open areas around his left elbow look better. This is a frail man with a history of falls. He has multiple myeloma by history and lumbar spinal stenosis. He uses a walker. He has actinic keratosis, actinic purpura and xerotic skin. He is [place], [place] ([id number]) severely damaged skin on his forearms READMISSION 01/05/17; this is a elderly man that we've had in clinic multiple times most recently in [place] earlier part of this year for a wound on Brandon left and olecranon area which was traumatic. His current problem started on 12/05/16 when he sustained a fall with a large scalp laceration over Brandon occiput. This was repaired with several staples and stitches. There was bleeding noted on 11/2 from Brandon wound which was not observed in Brandon ER and he was sent home. His sutures were removed on 12/20/16 however he was seen again on 12/21/16 in Brandon ER with bleeding from Brandon wound. At that time there was extensive debridement of granulation tissue and redundant skin flap was performed to approximate skin borders. Brandon wound was stapled. He was referred back to his primary doctor for removal of Brandon staples however Brandon primary doctor sent him here. In Brandon meantime he has undergone a kyphoplasty at 12/30/16 of T10 looking through [place] 01/12/17; patient with a large laceration on Brandon occiput of his scalp. I removed his stitches last week and there was still open areas remaining. We've been using silver alginate. He is doing well 01/19/17 on evaluation today patient appears to be doing fairly well in regard to his scalp wound. He has been tolerating Brandon dressing changes without complication at this point. His wife performs Brandon dressing changes for him. No fevers, chills, nausea, or vomiting  noted at this time. 01/27/17; this patient's wound is larger than I would've thought. Pear-shaped wound on Brandon occiput. He has been using silver alginate. Per our intake nurse he arrived with a large scab over Brandon wound which peeled off. There was some purulent looking drainage. 02/02/17; this patient's wound is still open although it is smaller. Still a cystlike area next to this which they asked me about every time 02/17/17; not much change in Brandon overall wound area on this patient's occipital scalp. Cystlike area next to this is unchanged. We have been using collagen. This was supposedly traumatic after a large laceration. He is also had another fall last week with a superficial skin tear on Brandon right upper thigh Objective Constitutional Sitting or standing Blood Pressure is within target range for patient.. Pulse regular and within target range for patient.Marland Kitchen Respirations regular, non-labored and within target range.. Temperature is normal and within Brandon target range for Brandon patient.Marland Kitchen appears in no distress. Vitals Time Taken: 3:41 AM, Height: 70 in, Weight: 181 lbs, BMI: 26, Temperature: 97.9 F, Pulse: 81 bpm, Respiratory Rate: 20 breaths/min, Blood Pressure: 136/65 mmHg. General Notes: wound exam; Brandon area on Brandon occipital scalp does not look infected however Brandon surface of Brandon wound required debridement not a healthy heeling surface. Brandon on this I am not really sure why we are having so much difficulty here. Patient has a new superficial skin tear on Brandon right anterior thigh just above Brandon knee. This does not look ominous Integumentary (Hair, Skin) Wound #10 status is Open. Original cause of wound was Trauma. Brandon wound is located on Brandon Right Knee. Brandon wound measures 0.5cm length x 0.5cm width x 0.1cm depth; 0.196cm^2  area and 0.02cm^3 volume. There is no tunneling or undermining noted. There is a small amount of serosanguineous drainage noted. Brandon wound margin is flat and intact.  There is large (67-100%) red granulation within Brandon wound bed. There is a small (1-33%) amount of necrotic tissue within Brandon wound bed including Adherent Slough. Brandon periwound skin appearance did not exhibit: Callus, Crepitus, Excoriation, Induration, Rash, Scarring, Dry/Scaly, Maceration, Atrophie Blanche, Cyanosis, Ecchymosis, Hemosiderin Staining, Mottled, Pallor, Rubor, Erythema. Wound #9 status is Open. Original cause of wound was Trauma. Brandon wound is located on Brandon Head - Parietal. Brandon wound [name], [name] ([id number]) measures 0.5cm length x 0.6cm width x 0.1cm depth; 0.236cm^2 area and 0.024cm^3 volume. There is no tunneling or undermining noted. There is a small amount of serous drainage noted. Brandon wound margin is flat and intact. There is large (67- 100%) red granulation within Brandon wound bed. There is no necrotic tissue within Brandon wound bed. Brandon periwound skin appearance exhibited: Dry/Scaly. Brandon periwound skin appearance did not exhibit: Callus, Crepitus, Excoriation, Induration, Rash, Scarring, Maceration, Atrophie Blanche, Cyanosis, Ecchymosis, Hemosiderin Staining, Mottled, Pallor, Rubor, Erythema. Assessment Active Problems ICD-10 S01.01XD - Laceration without foreign body of scalp, subsequent encounter S81.011D - Laceration without foreign body, right knee, subsequent encounter Procedures Wound #9 Pre-procedure diagnosis of Wound #9 is a Trauma, Other located on Brandon Head - Parietal . There was a Skin/Subcutaneous Tissue Debridement ([id number]) debridement with total area of 0.3 sq cm performed by Brandon Dillon, MD. with Brandon following instrument(s): Curette to remove Viable and Non-Viable tissue/material including Fibrin/Slough, Skin, and Subcutaneous after achieving pain control using Other (lidocaine 4%). A time out was conducted at 03:58, prior to Brandon start of Brandon procedure. A Minimum amount of bleeding was controlled with Pressure. Brandon procedure was tolerated well with  a pain level of 0 throughout and a pain level of 0 following Brandon procedure. Post Debridement Measurements: 0.5cm length x 0.6cm width x 0.1cm depth; 0.024cm^3 volume. Character of Wound/Ulcer Post Debridement is stable. Post procedure Diagnosis Wound #9: Same as Pre-Procedure Plan Wound Cleansing: Wound #10 Right Knee: Clean wound with Normal Saline. Wound #9 Head - Parietal: Clean wound with Normal Saline. Anesthetic (add to Medication List): Wound #9 Head - Parietal: Topical Lidocaine 4% cream applied to wound bed prior to debridement (In Clinic Only). Primary Wound Dressing: Wound #9 Head - Parietal: Prisma Ag Wound #10 Right Knee: Other: - triple antibiotic cream Secondary Dressing: Wound #10 Right Knee: [name], [name] ([id number]) Other - coverlet Wound #9 Head - Parietal: Other - coverlet Dressing Change Frequency: Wound #9 Head - Parietal: Change dressing every other day. Follow-up Appointments: Return Appointment in 2 weeks. #1 I'm continuing with Brandon Prisma to Brandon occiput #2 triple antibiotic therapy to Brandon area on his thigh 3 follow-up 2 weeks he has home health Electronic Signature(s) Signed: 02/17/2017 5:48:27 PM By: Brandon Ham MD Entered By: [name] on 02/17/2017 17:33:37 [place], [place] ([id number]) -------------------------------------------------------------------------------- SuperBill Details Patient Name: [name], [name] Date of Service: 02/17/2017 Medical Record Number: [id number] Patient Account Number: [id number] Date of Birth/Sex: [birth year] (84 y.o. Male) Treating RN: [name] Primary Care Provider: [facility], [name] Other Clinician: Referring Provider: [facility], [name] Treating Provider/Extender: [name] Weeks in Treatment: 6 Diagnosis Coding ICD-10 Codes Code Description S01.01XD Laceration without foreign body of scalp, subsequent encounter S81.011D Laceration without foreign body, right knee, subsequent encounter Facility  Procedures CPT4 Code: 10626948 Description: 54627 - DEB SUBQ TISSUE 20 SQ CM/< ICD-10 Diagnosis Description  S01.01XD Laceration without foreign body of scalp, subsequent enco Modifier: unter Quantity: 1 Physician Procedures CPT4 Code: 8677373 Description: 66815 - WC PHYS SUBQ TISS 20 SQ CM ICD-10 Diagnosis Description S01.01XD Laceration without foreign body of scalp, subsequent enco Modifier: unter Quantity: 1 Electronic Signature(s) Signed: 02/17/2017 5:48:27 PM By: Brandon Ham MD Entered By: [name] on 02/17/2017 17:33:56

## 2017-02-19 ENCOUNTER — Other Ambulatory Visit: Payer: Self-pay | Admitting: Orthopedic Surgery

## 2017-02-19 DIAGNOSIS — M4854XA Collapsed vertebra, not elsewhere classified, thoracic region, initial encounter for fracture: Secondary | ICD-10-CM

## 2017-02-19 NOTE — Progress Notes (Signed)
GENERAL, WEARING ([id number]) Visit Report for 02/17/2017 Arrival Information Details Patient Name: Caine, Ladarrell L. Date of Service: 02/17/2017 3:30 PM Medical Record Number: [id number] Patient Account Number: [id number] Date of Birth/Sex: [birth year] (84 y.o. Male) Treating RN: Roger Shelter Primary Care Lahela Woodin: [facility], [facility] Other Clinician: Referring Siana Panameno: [facility], [place] Treating Dariyon Urquilla/Extender: Tito Dine in Treatment: 6 Visit Information History Since Last Visit All ordered tests and consults were completed: No Patient Arrived: [name] Added or deleted any medications: No Arrival Time: 15:40 Any new allergies or adverse reactions: No Accompanied By: wife Had a fall or experienced change in No Transfer Assistance: None activities of daily living that may affect Patient Identification Verified: Yes risk of falls: Secondary Verification Process Completed: Yes Signs or symptoms of abuse/neglect since last visito No Patient Requires Transmission-Based Precautions: No Hospitalized since last visit: No Patient Has Alerts: No Pain Present Now: No Electronic Signature(s) Signed: 02/17/2017 5:08:51 PM By: Roger Shelter Entered By: Roger Shelter on 02/17/2017 15:40:55 Vowels, Kavontae L. ([id number]) -------------------------------------------------------------------------------- Encounter Discharge Information Details Patient Name: Digeronimo, Tamel L. Date of Service: 02/17/2017 3:30 PM Medical Record Number: [id number] Patient Account Number: [id number] Date of Birth/Sex: January 03, 1924 (84 y.o. Male) Treating RN: Roger Shelter Primary Care Valery Chance: [facility], [facility] Other Clinician: Referring Tait Balistreri: [facility], [place] Treating Heiley Shaikh/Extender: Tito Dine in Treatment: 6 Encounter Discharge Information Items Schedule Follow-up Appointment: No Medication Reconciliation completed and No provided to Patient/Care Jolon Degante: Patient Clinical Summary of Care: Declined Electronic  Signature(s) Signed: 02/19/2017 8:42:55 AM By: Ruthine Dose Entered By: Ruthine Dose on 02/17/2017 16:05:39 Luther, Jacquese L. ([id number]) -------------------------------------------------------------------------------- Lower Extremity Assessment Details Patient Name: Leavelle, Dardan L. Date of Service: 02/17/2017 3:30 PM Medical Record Number: [id number] Patient Account Number: [id number] Date of Birth/Sex: Nov 28, 1923 (84 y.o. Male) Treating RN: Roger Shelter Primary Care Ivette Castronova: [facility], Dossie Der Other Clinician: Referring Rosella Crandell: [facility], [facility] Treating Ezell Poke/Extender: Ricard Dillon Weeks in Treatment: 6 Electronic Signature(s) Signed: 02/17/2017 5:08:51 PM By: Roger Shelter Entered By: Roger Shelter on 02/17/2017 15:45:52 Hoh, Michiah L. ([id number]) -------------------------------------------------------------------------------- Multi Wound Chart Details Patient Name: Oren, Cross L. Date of Service: 02/17/2017 3:30 PM Medical Record Number: [id number] Patient Account Number: [id number] Date of Birth/Sex: Oct 27, 1923 (84 y.o. Male) Treating RN: Roger Shelter Primary Care Rowyn Mustapha: [facility], [facility] Other Clinician: Referring Anysha Frappier: [facility], [facility] Treating Kensly Bowmer/Extender: Tito Dine in Treatment: 6 Vital Signs Height(in): 70 Pulse(bpm): 81 Weight(lbs): 181 Blood Pressure(mmHg): 136/65 Body Mass Index(BMI): 26 Temperature(F): 97.9 Respiratory Rate 20 (breaths/min): Photos: [10:No Photos] [9:No Photos] [N/A:N/A] Wound Location: [10:Right Knee] [9:Head - Parietal] [N/A:N/A] Wounding Event: [10:Trauma] [9:Trauma] [N/A:N/A] Primary Etiology: [10:Trauma, Other] [9:Trauma, Other] [N/A:N/A] Comorbid History: [10:Cataracts, Chronic sinus problems/congestion, Sleep Apnea, Coronary Artery Disease, Osteoarthritis, Received Chemotherapy] [9:Cataracts, Chronic sinus problems/congestion, Sleep Apnea, Coronary Artery Disease, Osteoarthritis,  Received Chemotherapy] [N/A:N/A] Date  Acquired: [10:02/11/2017] [9:12/04/2016] [N/A:N/A] Weeks of Treatment: [10:0] [9:6] [N/A:N/A] Wound Status: [10:Open] [9:Open] [N/A:N/A] Clustered Wound: [10:No] [9:Yes] [N/A:N/A] Measurements L x W x D [10:0.5x0.5x0.1] [9:0.5x0.6x0.1] [N/A:N/A] (cm) Area (cm) : [10:0.196] [9:0.236] [N/A:N/A] Volume (cm) : [10:0.02] [9:0.024] [N/A:N/A] % Reduction in Area: [10:N/A] [9:95.50%] [N/A:N/A] % Reduction in Volume: [10:N/A] [9:95.50%] [N/A:N/A] Classification: [10:Partial Thickness] [9:Partial Thickness] [N/A:N/A] Exudate Amount: [10:Small] [9:Small] [N/A:N/A] Exudate Type: [10:Serosanguineous] [9:Serous] [N/A:N/A] Exudate Color: [10:red, brown] [9:amber] [N/A:N/A] Wound Margin: [10:Flat and Intact] [9:Flat and Intact] [N/A:N/A] Granulation Amount: [10:Large (67-100%)] [9:Large (67-100%)] [N/A:N/A] Granulation Quality: [10:Red] [9:Red] [N/A:N/A] Necrotic Amount: [10:Small (1-33%)] [9:None Present (0%)] [N/A:N/A] Exposed Structures: [10:Fascia: No Fat Layer (Subcutaneous Tissue) Exposed: No Tendon: No Muscle:  No Joint: No Bone: No] [9:N/A] [N/A:N/A] Epithelialization: [10:Medium (34-66%)] [9:Medium (34-66%)] [N/A:N/A] Debridement: [10:N/A N/A] [9:Debridement ([id number]) 03:58] [N/A:N/A N/A] Pre-procedure Verification/Time Out Taken: Pain Control: N/A Other N/A Tissue Debrided: N/A Fibrin/Slough, Skin, N/A Subcutaneous Level: N/A Skin/Subcutaneous Tissue N/A Debridement Area (sq cm): N/A 0.3 N/A Instrument: N/A Curette N/A Bleeding: N/A Minimum N/A Hemostasis Achieved: N/A Pressure N/A Procedural Pain: N/A 0 N/A Post Procedural Pain: N/A 0 N/A Debridement Treatment N/A Procedure was tolerated well N/A Response: Post Debridement N/A 0.5x0.6x0.1 N/A Measurements L x W x D (cm) Post Debridement Volume: N/A 0.024 N/A (cm) Periwound Skin Texture: Excoriation: No Excoriation: No N/A Induration: No Induration: No Callus: No Callus: No Crepitus: No Crepitus: No Rash:  No Rash: No Scarring: No Scarring: No Periwound Skin Moisture: Maceration: No Dry/Scaly: Yes N/A Dry/Scaly: No Maceration: No Periwound Skin Color: Atrophie Blanche: No Atrophie Blanche: No N/A Cyanosis: No Cyanosis: No Ecchymosis: No Ecchymosis: No Erythema: No Erythema: No Hemosiderin Staining: No Hemosiderin Staining: No Mottled: No Mottled: No Pallor: No Pallor: No Rubor: No Rubor: No Tenderness on Palpation: No No N/A Wound Preparation: Ulcer Cleansing: Ulcer Cleansing: N/A Rinsed/Irrigated with Saline Rinsed/Irrigated with Saline Topical Anesthetic Applied: Topical Anesthetic Applied: None Other: lidocaine 4% Procedures Performed: N/A Debridement N/A Treatment Notes Wound #10 (Right Knee) 1. Cleansed with: Clean wound with Normal Saline 3. Peri-wound Care: Other peri-wound care (specify in notes) Notes triple antibiotic cream and coverlet Wound #9 (Head - Parietal) 1. Cleansed with: Clean wound with Normal Saline 2. Anesthetic Crossin, Maeson L. ([id number]) Topical Lidocaine 4% cream to wound bed prior to debridement 4. Dressing Applied: Prisma Ag Notes coverlet bandage Electronic Signature(s) Signed: 02/17/2017 5:48:27 PM By: Linton Ham MD Entered By: Linton Ham on 02/17/2017 17:29:04 Waltrip, Brylen L. ([id number]) -------------------------------------------------------------------------------- Pain Assessment Details Patient Name: Ostenson, Ralf L. Date of Service: 02/17/2017 3:30 PM Medical Record Number: [id number] Patient Account Number: [id number] Date of Birth/Sex: [birth year] (84 y.o. Male) Treating RN: Roger Shelter Primary Care Usha Slager: [facility], Dossie Der Other Clinician: Referring Elaya Droege: [facility], [facility] Treating Barclay Lennox/Extender: Ricard Dillon Weeks in Treatment: 6 Active Problems Location of Pain Severity and Description of Pain Patient Has Paino No Site Locations Pain Management and Medication Current Pain Management: Electronic  Signature(s) Signed: 02/17/2017 5:08:51 PM By: Roger Shelter Entered By: Roger Shelter on 02/17/2017 15:41:02 Joerger, Yves L. ([id number]) -------------------------------------------------------------------------------- Patient/Caregiver Education Details Patient Name: Hiltner, Leone L. Date of Service: 02/17/2017 3:30 PM Medical Record Number: [id number] Patient Account Number: [id number] Date of Birth/Gender: 1923-06-28 (84 y.o. Male) Treating RN: Roger Shelter Primary Care Physician: [facility], [facility] Other Clinician: Referring Physician: [facility], Dossie Der Treating Physician/Extender: Tito Dine in Treatment: 6 Education Assessment Education Provided To: Patient Education Topics Provided Wound Debridement: Handouts: Wound Debridement Methods: Explain/Verbal Responses: State content correctly Wound/Skin Impairment: Handouts: Caring for Your Ulcer Methods: Explain/Verbal Responses: State content correctly Electronic Signature(s) Signed: 02/17/2017 5:08:51 PM By: Roger Shelter Entered By: Roger Shelter on 02/17/2017 16:10:52 [place], Zeplin L. ([id number]) -------------------------------------------------------------------------------- Wound Assessment Details Patient Name: Staffieri, Zymere L. Date of Service: 02/17/2017 3:30 PM Medical Record Number: [id number] Patient Account Number: [id number] Date of Birth/Sex: July 31, 1923 (84 y.o. Male) Treating RN: Roger Shelter Primary Care Jerolyn Flenniken: [facility], [facility] Other Clinician: Referring Richie Vadala: [facility], [facility] Treating Donney Caraveo/Extender: Ricard Dillon Weeks in Treatment: 6 Wound Status Wound Number: 10 Primary Trauma, Other Etiology: Wound Location: Right Knee Wound Open Wounding Event: Trauma Status: Date Acquired: 02/11/2017 Comorbid Cataracts, Chronic sinus problems/congestion, Weeks Of Treatment: 0 History: Sleep Apnea, Coronary Artery Disease, Clustered  Wound: No Osteoarthritis, Received Chemotherapy Wound Measurements Length:  (cm) 0.5 Width: (cm) 0.5 Depth: (cm) 0.1 Area: (cm) 0.196 Volume: (cm) 0.02 % Reduction in Area: % Reduction in Volume: Epithelialization: Medium (34-66%) Tunneling: No Undermining: No Wound Description Classification: Partial Thickness Foul O Wound Margin: Flat and Intact Slough Exudate Amount: Small Exudate Type: Serosanguineous Exudate Color: red, brown dor After Cleansing: No /Fibrino No Wound Bed Granulation Amount: Large (67-100%) Exposed Structure Granulation Quality: Red Fascia Exposed: No Necrotic Amount: Small (1-33%) Fat Layer (Subcutaneous Tissue) Exposed: No Necrotic Quality: Adherent Slough Tendon Exposed: No Muscle Exposed: No Joint Exposed: No Bone Exposed: No Periwound Skin Texture Texture Color No Abnormalities Noted: No No Abnormalities Noted: No Callus: No Atrophie Blanche: No Crepitus: No Cyanosis: No Excoriation: No Ecchymosis: No Induration: No Erythema: No Rash: No Hemosiderin Staining: No Scarring: No Mottled: No Pallor: No Moisture Rubor: No No Abnormalities Noted: No Dry / Scaly: No Maceration: No Veldhu[place], Asir L. ([id number]) Wound Preparation Ulcer Cleansing: Rinsed/Irrigated with Saline Topical Anesthetic Applied: None Treatment Notes Wound #10 (Right Knee) 1. Cleansed with: Clean wound with Normal Saline 3. Peri-wound Care: Other peri-wound care (specify in notes) Notes triple antibiotic cream and coverlet Electronic Signature(s) Signed: 02/17/2017 5:08:51 PM By: Roger Shelter Entered By: Roger Shelter on 02/17/2017 16:09:23 Schaner, Dayn L. ([id number]) -------------------------------------------------------------------------------- Wound Assessment Details Patient Name: Brayboy, Bob L. Date of Service: 02/17/2017 3:30 PM Medical Record Number: [id number] Patient Account Number: [id number] Date of Birth/Sex: Mar 22, 1923 (84 y.o. Male) Treating RN: Roger Shelter Primary Care Lamarion Mcevers: [facility] [address], [facility] Other  Clinician: Referring Doy Taaffe: [facility], [facility] Treating Matteus Mcnelly/Extender: Ricard Dillon Weeks in Treatment: 6 Wound Status Wound Number: 9 Primary Trauma, Other Etiology: Wound Location: Head - Parietal Wound Open Wounding Event: Trauma Status: Date Acquired: 12/04/2016 Comorbid Cataracts, Chronic sinus problems/congestion, Weeks Of Treatment: 6 History: Sleep Apnea, Coronary Artery Disease, Clustered Wound: Yes Osteoarthritis, Received Chemotherapy Photos Photo Uploaded By: Roger Shelter on 02/18/2017 08:12:04 Wound Measurements Length: (cm) 0.5 Width: (cm) 0.6 Depth: (cm) 0.1 Area: (cm) 0.236 Volume: (cm) 0.024 % Reduction in Area: 95.5% % Reduction in Volume: 95.5% Epithelialization: Medium (34-66%) Tunneling: No Undermining: No Wound Description Classification: Partial Thickness Wound Margin: Flat and Intact Exudate Amount: Small Exudate Type: Serous Exudate Color: amber Foul Odor After Cleansing: No Slough/Fibrino Yes Wound Bed Granulation Amount: Large (67-100%) Granulation Quality: Red Necrotic Amount: None Present (0%) Periwound Skin Texture Texture Color No Abnormalities Noted: No No Abnormalities Noted: No Callus: No Atrophie Blanche: No Crepitus: No Cyanosis: No Kissick, Hansel L. ([id number]) Excoriation: No Ecchymosis: No Induration: No Erythema: No Rash: No Hemosiderin Staining: No Scarring: No Mottled: No Pallor: No Moisture Rubor: No No Abnormalities Noted: No Dry / Scaly: Yes Maceration: No Wound Preparation Ulcer Cleansing: Rinsed/Irrigated with Saline Topical Anesthetic Applied: Other: lidocaine 4%, Treatment Notes Wound #9 (Head - Parietal) 1. Cleansed with: Clean wound with Normal Saline 2. Anesthetic Topical Lidocaine 4% cream to wound bed prior to debridement 4. Dressing Applied: Prisma Ag Notes coverlet bandage Electronic Signature(s) Signed: 02/17/2017 5:08:51 PM By: Roger Shelter Entered By: Roger Shelter  on 02/17/2017 15:44:44 [place], [place] ([id number]) -------------------------------------------------------------------------------- Vitals Details Patient Name: Sprong, Aengus L. Date of Service: 02/17/2017 3:30 PM Medical Record Number: [id number] Patient Account Number: [id number] Date of Birth/Sex: 09-20-1923 (84 y.o. Male) Treating RN: Roger Shelter Primary Care Sahiba Granholm: [facility], [facility] Other Clinician: Referring Rieley Khalsa: [facility], [facility] Treating Deran Barro/Extender: Tito Dine in Treatment: 6 Vital Signs Time Taken: 03:41 Temperature (F): 97.9 Height (in): 70 Pulse (bpm):  81 Weight (lbs): 181 Respiratory Rate (breaths/min): 20 Body Mass Index (BMI): 26 Blood Pressure (mmHg): 136/65 Reference Range: 80 - 120 mg / dl Electronic Signature(s) Signed: 02/17/2017 5:08:51 PM By: Roger Shelter Entered By: Roger Shelter on 02/17/2017 15:41:21

## 2017-02-22 ENCOUNTER — Ambulatory Visit (INDEPENDENT_AMBULATORY_CARE_PROVIDER_SITE_OTHER): Payer: Medicare Other | Admitting: Pulmonary Disease

## 2017-02-22 ENCOUNTER — Encounter: Payer: Self-pay | Admitting: Pulmonary Disease

## 2017-02-22 VITALS — BP 116/70 | HR 78 | Ht 70.0 in | Wt 188.0 lb

## 2017-02-22 DIAGNOSIS — J42 Unspecified chronic bronchitis: Secondary | ICD-10-CM | POA: Diagnosis not present

## 2017-02-22 DIAGNOSIS — J90 Pleural effusion, not elsewhere classified: Secondary | ICD-10-CM | POA: Diagnosis not present

## 2017-02-22 DIAGNOSIS — J941 Fibrothorax: Secondary | ICD-10-CM

## 2017-02-22 DIAGNOSIS — R062 Wheezing: Secondary | ICD-10-CM | POA: Diagnosis not present

## 2017-02-22 NOTE — Patient Instructions (Addendum)
Trial of Breo inhaler - use one week on, one week off, etc to decide whether it helps any breathing symptoms  If you think that the Breo helps, call her and we will send a prescription to the pharmacy  Follow up as needed

## 2017-02-25 ENCOUNTER — Encounter: Payer: Self-pay | Admitting: Pulmonary Disease

## 2017-02-25 NOTE — Progress Notes (Signed)
PULMONARY CONSULT NOTE  Requesting MD/Service: Keith Rake Date of initial consultation: 02/22/17 Reason for consultation: Abn CT chest, "wheezing"  PT PROFILE: 84 y.o. male with very remote minimal smoking history referred for evaluation of abnormal CT chest and wheezing noted by primary physician  HPI:  As above.  The patient is a somewhat difficult historian.  Much of the history is provided by his wife.  He is referred because his primary care physician noted wheezing on exam.  The patient is unable to provide a definitive reason why he is being evaluated by me.  He denies shortness of breath and chest pain.  He denies cough and hemoptysis.  His wife describes occasional rattling cough and the appearance of being winded with exertion.  However, he is far more limited by lower extremity weakness than by dyspnea.  Review of his medical records indicates multiple emergency room visits after falls.  Past Medical History:  Diagnosis Date  . Anginal pain ([place])    c/o heaviness a few years ago.  stent placed and all resolved  . Anxiety   . BPH (benign prostatic hypertrophy)   . Carotid stenosis    a. 05/2015 Carotid U/S: mild nonobs atherosclerosis. No need for f/u.  Marland Kitchen Chronic back pain   . Coronary artery disease    a. 06/2013 Cath/PCI: LAD 50-70p (FFR 0.82-->PCI w/ 3.5x24 Promus DES), RCA subtotally occluded w/ L->R collats, LCX 30ost.  . Dyslipidemia   . GERD (gastroesophageal reflux disease)   . Hx of [name]   . Hyperlipidemia   . Hypertension   . Inguinal hernia   . Lightheadedness    a. chronic, somewhat positional.  . 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 Major depression   . Pleural effusion    left  . Sleep apnea    will not use cpap    Past Surgical History:  Procedure Laterality Date  . ANTERIOR APPROACH HEMI HIP ARTHROPLASTY Right 04/16/2016   Procedure: ANTERIOR APPROACH HEMI HIP ARTHROPLASTY;   Surgeon: Hessie Knows, MD;  Location: [facility] ORS;  Service: Orthopedics;  Laterality: Right;  . APPENDECTOMY     84 years old  . arm fracture Right 2008   rod in arm  . CARDIAC CATHETERIZATION  2002  . CARDIAC CATHETERIZATION  06/2013   stent placed  . COLONOSCOPY    . EYE SURGERY Bilateral 2008   cataract extraction  . FEMUR FRACTURE SURGERY Left    with rod  . HEMORROIDECTOMY     75 years ago  . HIP FRACTURE SURGERY    . JOINT REPLACEMENT Bilateral 03/2016   right partial knee replacement, bilateral THR  . KYPHOPLASTY N/A 12/30/2016   Procedure: [id number]-N47;  Surgeon: Hessie Knows, MD;  Location: [facility] ORS;  Service: Orthopedics;  Laterality: N/A;    MEDICATIONS: I have reviewed all medications and confirmed regimen as documented  Social History   Socioeconomic History  . Marital status: Married    Spouse name: Not on file  . Number of children: Not on file  . Years of education: Not on file  . Highest education level: Not on file  Social Needs  . Financial resource strain: Not on file  . Food insecurity - worry: Not on file  . Food insecurity - inability: Not on file  . Transportation needs - medical: Not on file  . Transportation needs - non-medical: Not on file  Occupational History  .  Not on file  Tobacco Use  . Smoking status: Former Smoker    Packs/day: 2.00    Years: 7.00    Pack years: 14.00    Types: Cigarettes    Last attempt to quit: 09/17/1953    Years since quitting: 63.4  . Smokeless tobacco: Never Used  Substance and Sexual Activity  . Alcohol use: No    Alcohol/week: 0.0 oz  . Drug use: No  . Sexual activity: Not Currently  Other Topics Concern  . Not on file  Social History Narrative  . Not on file    Family History  Problem Relation Age of Onset  . Heart disease Mother   . Stroke Mother   . Diabetes Father   . Dementia Father   . Stroke Sister   . Stroke Brother   . Stroke Sister   . Stroke Sister   . Stroke Brother   . Stroke  Brother     ROS: No fever, myalgias/arthralgias, unexplained weight loss or weight gain No new focal weakness or sensory deficits No otalgia, hearing loss, visual changes, nasal and sinus symptoms, mouth and throat problems No neck pain or adenopathy No abdominal pain, N/V/D, diarrhea, change in bowel pattern No dysuria, change in urinary pattern   Vitals:   02/22/17 1133 02/22/17 1138  BP:  116/70  Pulse:  78  SpO2:  98%  Weight: 85.3 kg (188 lb)   Height: 5\' 10"  (1.778 m)      EXAM:  Gen: appears much younger than his true age, in wheelchair,  no overt respiratory distress HEENT: NCAT, sclera white, oropharynx normal, healing scalp laceration noted  Neck: Supple without LAN, thyromegaly, JVD Lungs: Breath sounds are full and slightly coarse without wheezes Cardiovascular: RRR, no murmurs noted Abdomen: Soft, nontender, normal BS Ext: without clubbing, cyanosis, edema Neuro: CNs grossly intact, motor and sensory intact Skin: Limited exam, no lesions noted  DATA:   BMP Latest Ref Rng & Units 12/31/2016 12/29/2016 12/07/2016  Glucose 65 - 99 mg/dL 126(H) 90 125(H)  BUN 6 - 20 mg/dL 30(H) 28(H) 24(H)  Creatinine 0.61 - 1.24 mg/dL 1.53(H) 1.33(H) 1.65(H)  BUN/Creat Ratio 10 - 22 - - -  Sodium 135 - 145 mmol/L 137 137 136  Potassium 3.5 - 5.1 mmol/L 4.1 4.7 4.4  Chloride 101 - 111 mmol/L 101 102 103  CO2 22 - 32 mmol/L 23 26 25   Calcium 8.9 - 10.3 mg/dL 8.9 9.1 9.2    CBC Latest Ref Rng & Units 12/31/2016 12/31/2016 12/29/2016  WBC 3.8 - 10.6 K/uL 16.9(H) 18.1(H) 8.6  Hemoglobin 13.0 - 18.0 g/dL 10.5(L) 9.9(L) 9.8(L)  Hematocrit 40.0 - 52.0 % 32.8(L) 30.6(L) 30.0(L)  Platelets 150 - 440 K/uL 165 169 182   CT chest 02/19/16: Loculated left pleural effusion with LLL atelectasis/consolidation.  Pleura on the left is thickened consistent with fibrothorax. CXR 12/31/16: Chronic left pleural effusion and pleural thickening  IMPRESSION:     ICD-10-CM   1. Wheezing R06.2    2. Mild chronic bronchitis J42   3. 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  Given his history of frequent falls, most likely, he has suffered a traumatic hemothorax in the past and this  is the residual consequences of that  PLAN:  We provided a sample of a Breo inhaler which she is to use 1 week on, one week off, 1 week on, one week off to discern whether it is of any benefit with regard to his pulmonary symptoms.  If he believes that it is beneficial, he or his wife is to contact our office and we will submit a prescription to the pharmacy for this medication.  There is nothing to do for the chronic left pleural effusion and probable fibrothorax as any surgical intervention would present excessive risk to him  Follow-up as needed   Merton Border, MD PCCM service Mobile [phone] Pager [phone] 02/25/2017 9:22 PM

## 2017-03-03 ENCOUNTER — Ambulatory Visit: Payer: Medicare Other | Admitting: Internal Medicine

## 2017-03-05 ENCOUNTER — Inpatient Hospital Stay: Payer: Medicare Other | Admitting: *Deleted

## 2017-03-05 ENCOUNTER — Inpatient Hospital Stay: Payer: Medicare Other | Attending: Internal Medicine | Admitting: Internal Medicine

## 2017-03-05 ENCOUNTER — Telehealth: Payer: Self-pay | Admitting: Family Medicine

## 2017-03-05 VITALS — BP 119/65 | HR 75 | Temp 97.4°F | Resp 16 | Wt 186.0 lb

## 2017-03-05 DIAGNOSIS — R634 Abnormal weight loss: Secondary | ICD-10-CM

## 2017-03-05 DIAGNOSIS — M545 Low back pain: Secondary | ICD-10-CM | POA: Insufficient documentation

## 2017-03-05 DIAGNOSIS — R222 Localized swelling, mass and lump, trunk: Secondary | ICD-10-CM

## 2017-03-05 DIAGNOSIS — Z87891 Personal history of nicotine dependence: Secondary | ICD-10-CM | POA: Insufficient documentation

## 2017-03-05 DIAGNOSIS — R229 Localized swelling, mass and lump, unspecified: Secondary | ICD-10-CM | POA: Diagnosis present

## 2017-03-05 DIAGNOSIS — C8300 Small cell B-cell lymphoma, unspecified site: Secondary | ICD-10-CM

## 2017-03-05 DIAGNOSIS — D72829 Elevated white blood cell count, unspecified: Secondary | ICD-10-CM | POA: Diagnosis not present

## 2017-03-05 DIAGNOSIS — D472 Monoclonal gammopathy: Secondary | ICD-10-CM | POA: Diagnosis not present

## 2017-03-05 DIAGNOSIS — D649 Anemia, unspecified: Secondary | ICD-10-CM | POA: Insufficient documentation

## 2017-03-05 DIAGNOSIS — M4854XA Collapsed vertebra, not elsewhere classified, thoracic region, initial encounter for fracture: Secondary | ICD-10-CM | POA: Insufficient documentation

## 2017-03-05 DIAGNOSIS — R296 Repeated falls: Secondary | ICD-10-CM | POA: Diagnosis not present

## 2017-03-05 LAB — CBC WITH DIFFERENTIAL/PLATELET
Basophils Absolute: 0 10*3/uL (ref 0–0.1)
Basophils Relative: 1 %
Eosinophils Absolute: 0.1 10*3/uL (ref 0–0.7)
Eosinophils Relative: 1 %
HCT: 29.9 % — ABNORMAL LOW (ref 40.0–52.0)
Hemoglobin: 9.8 g/dL — ABNORMAL LOW (ref 13.0–18.0)
Lymphocytes Relative: 16 %
Lymphs Abs: 1.6 10*3/uL (ref 1.0–3.6)
MCH: 27.1 pg (ref 26.0–34.0)
MCHC: 32.6 g/dL (ref 32.0–36.0)
MCV: 83 fL (ref 80.0–100.0)
Monocytes Absolute: 0.8 10*3/uL (ref 0.2–1.0)
Monocytes Relative: 8 %
Neutro Abs: 7.4 10*3/uL — ABNORMAL HIGH (ref 1.4–6.5)
Neutrophils Relative %: 74 %
Platelets: 267 10*3/uL (ref 150–440)
RBC: 3.6 MIL/uL — ABNORMAL LOW (ref 4.40–5.90)
RDW: 15.6 % — ABNORMAL HIGH (ref 11.5–14.5)
WBC: 9.9 10*3/uL (ref 3.8–10.6)

## 2017-03-05 MED ORDER — HYDROCODONE-ACETAMINOPHEN 5-325 MG PO TABS: ORAL_TABLET | ORAL | 0 refills | Status: DC

## 2017-03-05 NOTE — Assessment & Plan Note (Addendum)
#  T10 paraspinal mass; question epidural extension. no spinal cord involvement.  Question malignancy versus infection.  Myeloma panel; kappa lambda light chain. Recent T10 kyphoplasty-negative for malignancy on biopsy.   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  Check a PET scan.  Question need for biopsy.  Patient given his age does not want to be too aggressive.  If PET scan concerning for malignancy-ideally biopsy would be recommended; however given patient's age/wishes local radiation could be considered.   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  Check myeloma labs.  #Chronic mild leukocytosis/anemia-normal platelets check peripheral review of smear CBC CMP LDH.  Peripheral blood flow cytometry.  Check BCR ABL.  #Plan follow-up based upon above workup; question need for PET scan.  We will also review at the tumor conference next week.  #Follow-up next week; to review labs imaging.   Thank you Dr. Rudene Christians for allowing me to participate in the care of your pleasant patient. Please do not hesitate to contact me with questions or concerns in the interim.  # I reviewed the blood work- with the patient in detail; also reviewed the imaging independently [as summarized above]; and with the patient in detail.

## 2017-03-05 NOTE — Progress Notes (Signed)
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 Lymphoplasmacytic Lymphoma. (202.80)  a. mets Bone Survey 09/16/00 - negative for disease.  b. SPEP performed 07/25/01 revealing an increase from 2.46 to 3.06.  c. 7/03, repeat skeletal survey, no lytic lesions.  d. BM BX 09/08/01 Lymphoplasmacytic lymhoma.  e. Rituxan weekly x four, started 09/25/04.  f. CT 06/18/05: essentially stable with slight decreased size of left pleural effusion.  g. Cycle 3 Rituxan, start 09/24/05.  h. CT 12/28/05: stable pleural effusion.  i. IgM lambda M-spike 2.13 g/dl on 04/28/10, 2.16 g/dl on 10/27/10, 1.84 g/dl on 11/02/11, 2.21 g/dl on 11/21/12, and 2.26 g/dl on 05/22/13.  J. 12/10/14 SPEP 1.67 g/dL.             Malignant lymphoplasmacytic lymphoma ([place])     HISTORY OF PRESENTING ILLNESS:  [name] 84 y.o.  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  He noted to have edema/paraspinal mass/fullness around T12 concerning for infection/lymphoma.  He has been referred to [place] for further evaluation recommendations.  As noted above patient was treated for his lymphoplasmacytic lymphoma in 2003 and has been on surveillance at [name].  He was just released from [place] about 3 years ago.  Patient has chronic multiple falls-attributes to gait instability/age.  He denies any syncopal episodes or headaches lightheadedness  prior to fall.  Appetite is fair.  Mild weight loss no significant weight loss.  Denies any lumps or bumps.  Denies night sweats.  He goes around with a rolling walker.  Patient is not interested in aggressive measures.  ROS: A complete 10 point review of system is done which is negative except mentioned above in history of present illness  MEDICAL HISTORY:  Past Medical History:  Diagnosis Date  . Anginal pain ([place])    c/o heaviness a few years ago.  stent placed and all resolved  . Anxiety   . BPH (benign prostatic hypertrophy)   . Carotid stenosis    a. 05/2015 Carotid U/S: mild nonobs atherosclerosis. No need for f/u.  Marland Kitchen Chronic back pain   . Coronary artery disease    a. 06/2013 Cath/PCI: LAD 50-70p (FFR 0.82-->PCI w/ 3.5x24 Promus DES), RCA subtotally occluded w/ L->R collats, LCX 30ost.  . Dyslipidemia   . GERD (gastroesophageal reflux disease)   . Hx of [name]   . Hyperlipidemia   . Hypertension   . Inguinal hernia   . Lightheadedness    a. chronic, somewhat positional.  . 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 Major depression   . Pleural effusion    left  . Sleep apnea    will not use cpap    SURGICAL HISTORY: Past Surgical History:  Procedure Laterality Date  . ANTERIOR APPROACH HEMI HIP ARTHROPLASTY Right 04/16/2016   Procedure: ANTERIOR APPROACH HEMI HIP ARTHROPLASTY;  Surgeon: Hessie Knows, MD;  Location: [facility] ORS;  Service: Orthopedics;  Laterality: Right;  . APPENDECTOMY     84 years old  . arm fracture Right 2008   rod in arm  . CARDIAC CATHETERIZATION  2002  . CARDIAC CATHETERIZATION  06/2013   stent placed  . COLONOSCOPY    . EYE SURGERY Bilateral 2008   cataract extraction  . FEMUR FRACTURE SURGERY Left    with rod  . HEMORROIDECTOMY     77 years ago  . HIP FRACTURE SURGERY    . JOINT REPLACEMENT Bilateral 03/2016   right partial knee replacement, bilateral THR  . 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  No alcohol.  Social History   Socioeconomic History  . Marital status: Married    Spouse name: Not on file  . Number of children: Not on file  . Years of education: Not on file  . Highest education level: Not on file  Social Needs  . Financial resource strain: Not on file  . Food insecurity - worry: Not on file  . Food insecurity - inability: Not on file  . Transportation needs - medical: Not on file  . Transportation needs - non-medical: Not on file  Occupational History  . Not on file  Tobacco Use  . Smoking status: Former Smoker    Packs/day: 2.00    Years: 7.00    Pack years: 14.00    Types: Cigarettes    Last attempt to quit: 09/17/1953    Years since quitting: 63.5  . Smokeless tobacco: Never Used  Substance and Sexual Activity  . Alcohol use: No    Alcohol/week: 0.0 oz  . Drug use: No  . Sexual activity: Not Currently  Other Topics Concern  . Not on file  Social History Narrative  . Not on file    FAMILY HISTORY: Family History  Problem Relation Age of Onset  . Heart disease Mother   . Stroke Mother   . Diabetes Father   . Dementia Father   . Stroke Sister   . Stroke Brother   . Stroke Sister   . Stroke Sister   . Stroke Brother   . Stroke Brother     ALLERGIES:  is allergic to cyclobenzaprine; baclofen; nsaids; sulfamethoxazole-trimethoprim; and tolmetin.  MEDICATIONS:  Current Outpatient Medications  Medication Sig Dispense Refill  . acetaminophen (TYLENOL) 500 MG tablet Take 2 tablets (1,000 mg total) by mouth every 6 (six) hours. 30 tablet 0  . albuterol (PROVENTIL HFA;VENTOLIN HFA) 108 (90 Base) MCG/ACT inhaler Inhale 2 puffs every 6 (six) hours as needed into the lungs for wheezing or shortness of breath. 1 Inhaler  2  . bacitracin 500 UNIT/GM ointment Apply 1 application topically 2 (two) times daily. 15 g 0  . clonazePAM (KLONOPIN) 0.5 MG tablet Take 1 tablet (0.5 mg total) by mouth 3 (three) times daily as needed for anxiety. 90 tablet 0  . doxazosin (CARDURA) 1 MG tablet Take 1 tablet (1 mg total) by mouth daily. 90 tablet 0  . furosemide (LASIX) 40 MG tablet Take 1 tablet (40 mg total) 2 (two) times daily by mouth. 40 mg p.o. twice daily for 3 days followed by 40 mg p.o. daily 60 tablet 11  . isosorbide mononitrate (IMDUR) 30 MG 24 hr tablet Take 1 tablet (30 mg total)  by mouth daily. 90 tablet 0  . metoprolol tartrate (LOPRESSOR) 25 MG tablet Take 1-2 tablets (25-50 mg total) 2 (two) times daily by mouth. Take 2 tablets ('50mg'$ ) in the morning and 1 tablet ('25mg'$ ) at bedtime 270 tablet 0  . Multiple Vitamins-Minerals (PRESERVISION/LUTEIN PO) Take by mouth 2 (two) times daily.    . nitroGLYCERIN (NITROSTAT) 0.4 MG SL tablet Place 0.4 mg under the tongue every 5 (five) minutes as needed for chest pain.    . pantoprazole (PROTONIX) 40 MG tablet Take 1 tablet (40 mg total) daily by mouth. 90 tablet 0  . polyethylene glycol (MIRALAX / GLYCOLAX) packet Take 17 g by mouth daily.    . pravastatin (PRAVACHOL) 40 MG tablet Take 1 tablet (40 mg total) by mouth daily. 90 tablet 0  . Saccharomyces boulardii (PROBIOTIC) 250 MG CAPS Take 1 tablet daily at 6 (six) AM by mouth. 14 capsule 1  . sertraline (ZOLOFT) 100 MG tablet Take 1.5 tablets (150 mg total) by mouth at bedtime. 135 tablet 0  . traMADol (ULTRAM) 50 MG tablet Take 25 mg 3 (three) times daily by mouth.     Marland Kitchen HYDROcodone-acetaminophen (NORCO/VICODIN) 5-325 MG tablet Take 1-2 tablets 1 hour prior to PET scan to help with back pain; and then 1 pill every 8 hours as needed 6 tablet 0   No current facility-administered medications for this visit.       Marland Kitchen  PHYSICAL EXAMINATION: ECOG PERFORMANCE STATUS: 2 - Symptomatic, <50% confined to bed  Vitals:    03/05/17 1344  BP: 119/65  Pulse: 75  Resp: 16  Temp: (!) 97.4 F (36.3 C)   Filed Weights   03/05/17 1344  Weight: 186 lb (84.4 kg)    GENERAL: Mechele Claude male patient.  Accompanied by his wife.  Alert, no distress and comfortable.   Walking with rolling walker EYES: no pallor or icterus OROPHARYNX: no thrush or ulceration; good dentition  NECK: supple, no masses felt LYMPH:  no palpable lymphadenopathy in the cervical, axillary or inguinal regions LUNGS: clear to auscultation and  No wheeze or crackles HEART/CVS: regular rate & rhythm and no murmurs; No lower extremity edema ABDOMEN: abdomen soft, non-tender and normal bowel sounds Musculoskeletal:no cyanosis of digits and no clubbing  PSYCH: alert & oriented x 3 with fluent speech NEURO: no focal motor/sensory deficits SKIN:  no rashes or significant lesions  LABORATORY DATA:  I have reviewed the data as listed Lab Results  Component Value Date   WBC 9.9 03/05/2017   HGB 9.8 (L) 03/05/2017   HCT 29.9 (L) 03/05/2017   MCV 83.0 03/05/2017   PLT 267 03/05/2017   Recent Labs    05/12/16 1119  12/07/16 0531 12/29/16 1245 12/31/16 0609  NA 134*   < > 136 137 137  K 4.7   < > 4.4 4.7 4.1  CL 99*   < > 103 102 101  CO2 28   < > '25 26 23  '$ GLUCOSE 117*   < > 125* 90 126*  BUN 20   < > 24* 28* 30*  CREATININE 1.03   < > 1.65* 1.33* 1.53*  CALCIUM 9.3   < > 9.2 9.1 8.9  GFRNONAA >60   < > 35* 45* 38*  GFRAA >60   < > 40* 52* 44*  PROT 8.0  --   --   --   --   ALBUMIN 3.5  --   --   --   --   AST  22  --   --   --   --   ALT 10*  --   --   --   --   ALKPHOS 115  --   --   --   --   BILITOT 0.6  --   --   --   --    < > = values in this interval not displayed.    RADIOGRAPHIC STUDIES: I have personally reviewed the radiological images as listed and agreed with the findings in the report. Mr Thoracic Spine Wo Contrast  Result Date: 02/18/2017 CLINICAL DATA:  Back pain. T10 kyphoplasty 12/30/2016. History of  lymphoma and myeloma EXAM: MRI THORACIC SPINE WITHOUT CONTRAST TECHNIQUE: Multiplanar, multisequence MR imaging of the thoracic spine was performed. No intravenous contrast was administered. COMPARISON:  MRI 12/23/2016 FINDINGS: Alignment: Normal alignment. Thoracic kyphosis unchanged from the prior study. Vertebrae: T10 fracture with interval kyphoplasty. Cement in satisfactory position. Cement is surrounded by fluid within the fracture cleft similar to the prior study. Mild loss of height of T10. Extensive bone marrow edema throughout the T10 vertebral body as noted previously. Extensive paravertebral soft tissue swelling slightly increased. Edema in the inferior endplate of T9 mildly increased. Edema superior endplate of M09 increased from the prior study New lesion in the left proximal T7 rib. New areas of bone marrow edema in the pedicles of T11 bilaterally. Question surgically related versus stress fractures Cord:  Normal cord signal.  No cord compression Paraspinal and other soft tissues: Progression of extensive Paraspinous edema surrounding the T10 fracture without fluid collection. Mild epidural soft tissue bilaterally at T10 similar to the prior study. Disc levels: Left paracentral disc protrusion T8-9 with cord flattening unchanged Right paracentral disc protrusion T10-11 with mild cord flattening is unchanged. IMPRESSION: Interval T10 kyphoplasty for fracture. Extensive bone marrow edema throughout the T10 vertebral body with progression of paraspinous soft tissue thickening. Mild epidural thickening at T10 and. Findings are concerning for infection or tumor. Note results of biopsy during kyphoplasty were negative for malignancy. Progression of superior endplate edema at O70 which may be related to the kyphoplasty procedure. New areas of edema in the T11 pedicle bilaterally possibly related to surgical procedure or stress fracture. New bone marrow lesion left seventh rib proximally. This could be an  area fracture however metastatic disease or myeloma are consideration giving the new finding. Consider follow-up MRI with contrast for further evaluation. Consider repeat imaging guided biopsy of paraspinous soft tissue. These results were called by telephone at the time of interpretation on 02/18/2017 at 10:19 am to Dr. Hessie Knows , who verbally acknowledged these results. Electronically Signed   By: Franchot Gallo M.D.   On: 02/18/2017 10:20   IMPRESSION: Interval T10 kyphoplasty for fracture. Extensive bone marrow edema throughout the T10 vertebral body with progression of paraspinous soft tissue thickening. Mild epidural thickening at T10 and. Findings are concerning for infection or tumor. Note results of biopsy during kyphoplasty were negative for malignancy.  Progression of superior endplate edema at J62 which may be related to the kyphoplasty procedure. New areas of edema in the T11 pedicle bilaterally possibly related to surgical procedure or stress fracture.  New bone marrow lesion left seventh rib proximally. This could be an area fracture however metastatic disease or myeloma are consideration giving the new finding.  Consider follow-up MRI with contrast for further evaluation. Consider repeat imaging guided biopsy of paraspinous soft tissue.  These results were called by telephone at the time of  interpretation on 02/18/2017 at 10:19 am to Dr. Hessie Knows , who verbally acknowledged these results.   Electronically Signed   By: Franchot Gallo M.D.   On: 02/18/2017 10:20  ASSESSMENT & PLAN:   Malignant lymphoplasmacytic lymphoma ([facility]) #T10 paraspinal mass; question epidural extension. no spinal cord involvement.  Question malignancy versus infection.  Myeloma panel; kappa lambda light chain. Recent T10 kyphoplasty-negative for malignancy on biopsy.   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  Check a PET scan.  Question need for  biopsy.  Patient given his age does not want to be too aggressive.  If PET scan concerning for malignancy-ideally biopsy would be recommended; however given patient's age/wishes local radiation could be considered.   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  Check myeloma labs.  #Chronic mild leukocytosis/anemia-normal platelets check peripheral review of smear CBC CMP LDH.  Peripheral blood flow cytometry.  Check BCR ABL.  #Plan follow-up based upon above workup; question need for PET scan.  We will also review at the tumor conference next week.  #Follow-up next week; to review labs imaging.   Thank you Dr. Rudene Christians for allowing me to participate in the care of your pleasant patient. Please do not hesitate to contact me with questions or concerns in the interim.  # I reviewed the blood work- with the patient in detail; also reviewed the imaging independently [as summarized above]; and with the patient in detail.    All questions were answered. The patient knows to call the clinic with any problems, questions or concerns.     Cammie Sickle, MD 03/05/2017 4:52 PM

## 2017-03-05 NOTE — Telephone Encounter (Signed)
Copied from [place] [id number]. Topic: Quick Communication - See Telephone Encounter >> Mar 05, 2017 11:02 AM Antonieta Iba C wrote: CRM for notification. See Telephone encounter for:  03/05/17.    [place] [phone] Phone           [phone]  Fax   Called in to request orders for twice a week for wound care.    She said that pt had a fall and has a large skin tare on his right arm and now has xeroform.     Please advise. Per Dawn if no answer from her it is okay to lvm and or fax orders to fax # provided.

## 2017-03-05 NOTE — Assessment & Plan Note (Deleted)
#  T10 paraspinal mass; question epidural extension. no spinal cord involvement.  Question malignancy versus infection.  Check PSA; myeloma panel; kappa lambda light chain.  #Recent T10 kyphoplasty-negative for malignancy on biopsy.  # History of lymphoma-1998; unlikely recurrence; question second primary versus others.  #Chronic mild leukocytosis/anemia-normal platelets check peripheral review of smear CBC CMP LDH.  Peripheral blood flow cytometry.  Check BCR ABL.  #Plan follow-up based upon above workup; question need for PET scan.  We will also review at the tumor conference next week.

## 2017-03-05 NOTE — Progress Notes (Signed)
Patient is here for a new patient visit today

## 2017-03-08 ENCOUNTER — Telehealth: Payer: Self-pay | Admitting: Pulmonary Disease

## 2017-03-08 LAB — COMP PANEL: LEUKEMIA/LYMPHOMA

## 2017-03-08 MED ORDER — FLUTICASONE FUROATE-VILANTEROL 100-25 MCG/INH IN AEPB: 1.0000 | INHALATION_SPRAY | Freq: Every day | RESPIRATORY_TRACT | 5 refills | Status: DC

## 2017-03-08 NOTE — Telephone Encounter (Signed)
error 

## 2017-03-08 NOTE — Telephone Encounter (Signed)
Left message that Rx has been sent to pharmacy

## 2017-03-08 NOTE — Telephone Encounter (Signed)
Patient wife states the [facility] inhaler is working great Would like a prescription called in to [facility] on [address]

## 2017-03-09 ENCOUNTER — Other Ambulatory Visit: Payer: Self-pay | Admitting: *Deleted

## 2017-03-09 ENCOUNTER — Other Ambulatory Visit: Payer: Self-pay | Admitting: Nurse Practitioner

## 2017-03-09 ENCOUNTER — Telehealth: Payer: Self-pay | Admitting: *Deleted

## 2017-03-09 MED ORDER — FLUTICASONE-SALMETEROL 500-50 MCG/DOSE IN AEPB: 1.0000 | INHALATION_SPRAY | Freq: Two times a day (BID) | RESPIRATORY_TRACT | 3 refills | Status: DC

## 2017-03-09 MED ORDER — HYDROCODONE-ACETAMINOPHEN 5-325 MG PO TABS: ORAL_TABLET | ORAL | 0 refills | Status: DC

## 2017-03-09 MED ORDER — FLUTICASONE-SALMETEROL 250-50 MCG/DOSE IN AEPB: 1.0000 | INHALATION_SPRAY | Freq: Two times a day (BID) | RESPIRATORY_TRACT | 3 refills | Status: DC

## 2017-03-09 NOTE — Telephone Encounter (Signed)
Patient states BREO inhaler is too expensive Would like to know if there are other options Please call to discuss

## 2017-03-09 NOTE — Telephone Encounter (Signed)
If she thinks that the [facility] was beneficial, we can try another ICS/LABA - Advair, Sybmicort, Dulera are all equivalent alternatives. If she doesn't think that the [facility] helped any symptoms, there is no reason to continue it or any other alternatives  Waunita Schooner

## 2017-03-09 NOTE — Addendum Note (Signed)
Addended by: Stephanie Coup on: 03/09/2017 02:24 PM   Modules accepted: Orders

## 2017-03-09 NOTE — Telephone Encounter (Signed)
Did not get Hydrocodone prescription at his last appointment for PET Scan and would like it sent to pharmacy ASAP. His PET is tomorrow

## 2017-03-09 NOTE — Telephone Encounter (Addendum)
Patient's spouse states [facility] was beneficial. Advair 250/50 has been sent to pharmacy. She will call back and update if too costly. Nothing further needed at this time.

## 2017-03-09 NOTE — Telephone Encounter (Signed)
Cannot escribe and cannot get in touch woth [facility]. Advised patient wife to come in and pick up prescription from office in AM

## 2017-03-09 NOTE — Addendum Note (Signed)
Addended by: Betti Cruz on: 03/09/2017 03:13 PM   Modules accepted: Orders

## 2017-03-10 ENCOUNTER — Other Ambulatory Visit: Payer: Self-pay | Admitting: Internal Medicine

## 2017-03-10 ENCOUNTER — Encounter
Admission: RE | Admit: 2017-03-10 | Discharge: 2017-03-10 | Disposition: A | Discharge status: Home / Self Care | Payer: Medicare Other | Source: Ambulatory Visit | Attending: Internal Medicine | Admitting: Internal Medicine

## 2017-03-10 ENCOUNTER — Telehealth: Payer: Self-pay | Admitting: Internal Medicine

## 2017-03-10 DIAGNOSIS — C8300 Small cell B-cell lymphoma, unspecified site: Secondary | ICD-10-CM | POA: Insufficient documentation

## 2017-03-10 DIAGNOSIS — R222 Localized swelling, mass and lump, trunk: Secondary | ICD-10-CM | POA: Insufficient documentation

## 2017-03-10 NOTE — Telephone Encounter (Signed)
Colette, please reschedule patient to see Dr. Jacinto Reap next Thursday 03/18/17. Thanks!

## 2017-03-10 NOTE — Telephone Encounter (Signed)
Patient added to tumor board for Thursday 03/18/17.

## 2017-03-11 ENCOUNTER — Inpatient Hospital Stay: Payer: Medicare Other | Admitting: Internal Medicine

## 2017-03-11 ENCOUNTER — Inpatient Hospital Stay: Payer: Medicare Other

## 2017-03-11 NOTE — Telephone Encounter (Signed)
Since we have not evaluated his wound, he will week to see a wound care specialist for wound care

## 2017-03-12 LAB — BCR-ABL1 FISH
Cells Analyzed: 200
Cells Counted: 200
PDF: 0

## 2017-03-12 NOTE — Telephone Encounter (Signed)
Since I have not evaluated this patient after discharge from the ER, please schedule an ER follow-up appointment and will determine the type and frequency of wound care

## 2017-03-12 NOTE — Telephone Encounter (Signed)
[name]  states the patient went to ER for skin tear right arm and now has  Xeroform regarding wound care. [name] still asking for verbal orders for twice a week for wound care

## 2017-03-15 ENCOUNTER — Encounter
Admission: RE | Admit: 2017-03-15 | Discharge: 2017-03-15 | Disposition: A | Discharge status: Home / Self Care | Payer: Medicare Other | Source: Ambulatory Visit | Attending: Internal Medicine | Admitting: Internal Medicine

## 2017-03-15 DIAGNOSIS — R222 Localized swelling, mass and lump, trunk: Secondary | ICD-10-CM | POA: Diagnosis not present

## 2017-03-15 DIAGNOSIS — C8300 Small cell B-cell lymphoma, unspecified site: Secondary | ICD-10-CM | POA: Diagnosis present

## 2017-03-15 LAB — GLUCOSE, CAPILLARY: Glucose-Capillary: 103 mg/dL — ABNORMAL HIGH (ref 65–99)

## 2017-03-15 MED ORDER — FLUDEOXYGLUCOSE F - 18 (FDG) INJECTION
12.0000 | Freq: Once | INTRAVENOUS | Status: AC | PRN
  Administered 2017-03-15: 12.86 via INTRAVENOUS

## 2017-03-15 NOTE — Telephone Encounter (Signed)
Called number [phone] @ 8:50am Left message informing pt to call the office to schedule appointment with Dr Manuella Ghazi

## 2017-03-16 ENCOUNTER — Other Ambulatory Visit: Payer: Self-pay | Admitting: Family Medicine

## 2017-03-16 DIAGNOSIS — K219 Gastro-esophageal reflux disease without esophagitis: Secondary | ICD-10-CM

## 2017-03-16 MED ORDER — PANTOPRAZOLE SODIUM 40 MG PO TBEC: 40.0000 mg | DELAYED_RELEASE_TABLET | Freq: Every day | ORAL | 0 refills | Status: AC

## 2017-03-16 NOTE — Telephone Encounter (Signed)
Copied from [place] [id number]. Topic: Quick Communication - Rx Refill/Question >> Mar 16, 2017 10:57 AM Synthia Innocent wrote: Medication:  pantoprazole (PROTONIX) 40 MG tablet    Has the patient contacted their pharmacy? Yes.     (Agent: If no, request that the patient contact the pharmacy for the refill.)   Preferred Pharmacy (with phone number or street name): [facility] on [address]    Agent: Please be advised that RX refills may take up to 3 business days. We ask that you follow-up with your pharmacy.

## 2017-03-18 ENCOUNTER — Inpatient Hospital Stay: Payer: Medicare Other

## 2017-03-18 ENCOUNTER — Inpatient Hospital Stay (HOSPITAL_BASED_OUTPATIENT_CLINIC_OR_DEPARTMENT_OTHER): Payer: Medicare Other | Admitting: Internal Medicine

## 2017-03-18 VITALS — BP 117/61 | HR 67 | Temp 97.5°F | Wt 183.0 lb

## 2017-03-18 DIAGNOSIS — S32009A Unspecified fracture of unspecified lumbar vertebra, initial encounter for closed fracture: Secondary | ICD-10-CM | POA: Diagnosis not present

## 2017-03-18 DIAGNOSIS — M545 Low back pain: Secondary | ICD-10-CM

## 2017-03-18 DIAGNOSIS — R222 Localized swelling, mass and lump, trunk: Secondary | ICD-10-CM

## 2017-03-18 DIAGNOSIS — C8300 Small cell B-cell lymphoma, unspecified site: Secondary | ICD-10-CM

## 2017-03-18 LAB — COMPREHENSIVE METABOLIC PANEL
ALT: 11 U/L — ABNORMAL LOW (ref 17–63)
AST: 23 U/L (ref 15–41)
Albumin: 3.5 g/dL (ref 3.5–5.0)
Alkaline Phosphatase: 157 U/L — ABNORMAL HIGH (ref 38–126)
Anion gap: 9 (ref 5–15)
BUN: 29 mg/dL — ABNORMAL HIGH (ref 6–20)
CO2: 26 mmol/L (ref 22–32)
Calcium: 9.1 mg/dL (ref 8.9–10.3)
Chloride: 102 mmol/L (ref 101–111)
Creatinine, Ser: 1.19 mg/dL (ref 0.61–1.24)
GFR calc Af Amer: 60 mL/min — ABNORMAL LOW (ref >60)
GFR calc non Af Amer: 52 mL/min — ABNORMAL LOW (ref >60)
Glucose, Bld: 118 mg/dL — ABNORMAL HIGH (ref 65–99)
Potassium: 4.7 mmol/L (ref 3.5–5.1)
Sodium: 137 mmol/L (ref 135–145)
Total Bilirubin: 0.4 mg/dL (ref 0.3–1.2)
Total Protein: 8.3 g/dL — ABNORMAL HIGH (ref 6.5–8.1)

## 2017-03-18 LAB — CBC WITH DIFFERENTIAL/PLATELET
Basophils Absolute: 0.1 10*3/uL (ref 0–0.1)
Basophils Relative: 1 %
Eosinophils Absolute: 0 10*3/uL (ref 0–0.7)
Eosinophils Relative: 1 %
HCT: 29.3 % — ABNORMAL LOW (ref 40.0–52.0)
Hemoglobin: 9.4 g/dL — ABNORMAL LOW (ref 13.0–18.0)
Lymphocytes Relative: 15 %
Lymphs Abs: 1.3 10*3/uL (ref 1.0–3.6)
MCH: 26.7 pg (ref 26.0–34.0)
MCHC: 32 g/dL (ref 32.0–36.0)
MCV: 83.3 fL (ref 80.0–100.0)
Monocytes Absolute: 0.8 10*3/uL (ref 0.2–1.0)
Monocytes Relative: 9 %
Neutro Abs: 6.5 10*3/uL (ref 1.4–6.5)
Neutrophils Relative %: 74 %
Platelets: 215 10*3/uL (ref 150–440)
RBC: 3.52 MIL/uL — ABNORMAL LOW (ref 4.40–5.90)
RDW: 16.3 % — ABNORMAL HIGH (ref 11.5–14.5)
WBC: 8.7 10*3/uL (ref 3.8–10.6)

## 2017-03-18 LAB — IRON AND TIBC
Iron: 23 ug/dL — ABNORMAL LOW (ref 45–182)
Saturation Ratios: 6 % — ABNORMAL LOW (ref 17.9–39.5)
TIBC: 375 ug/dL (ref 250–450)
UIBC: 353 ug/dL

## 2017-03-18 LAB — LACTATE DEHYDROGENASE: LDH: 101 U/L (ref 98–192)

## 2017-03-18 LAB — C-REACTIVE PROTEIN: CRP: <0.8 mg/dL (ref <1.0)

## 2017-03-18 LAB — FERRITIN: Ferritin: 28 ng/mL (ref 24–336)

## 2017-03-18 NOTE — Assessment & Plan Note (Addendum)
#   MRI jan 2019-T10 paraspinal mass; question epidural extension. no spinal cord involvement.  Recent T10 kyphoplasty-negative for malignancy on biopsy. Jan 2019- PET- "non-avid"  # Given history of lymphoplasmacytic lymphoma; recurrent seems to be considered. Question need for biopsy.  Will discuss at the tumor conference; also left a message for Dr. Rudene Christians.   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 Check myeloma labs.  Awaiting labs today.   #Mild anemia multifactorial 9.8; mild leukocytosis-BCR ABL/flow cytometry negative.  #Vertebral fractures-likely osteoporosis.  Patient will benefit from bone density [place].   # Will call pt/wife re: the next plan of care; Bx vs surveillance after discussion with Dr.Menz/tumor conference.  #  [phone]/ home line  #Follow-up to be decided  Addendum: Discussed with Dr. Rudene Christians Jolinda Croak the tumor conference.  As a PET scan was non-avid; biopsy is technically feasible; and would be recommended if patient is "symptomatic".  Also discussed regarding bisphosphonate/Dr.Menz to refer to Endocrinology.

## 2017-03-18 NOTE — Progress Notes (Signed)
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 ? LP lymphoma vs others  # T10 vertebral Fracture- s/p kyphoplasty-bx-neg for malignancy.    # [name] [s/p stent; 2015]; Hx of falls ; chronic R pleural effusion  ----------------------------------------  1. Lymphoplasmacytic Lymphoma. (202.80)  a. mets Bone Survey 09/16/00 - negative for disease.  b. SPEP performed 07/25/01 revealing an increase from 2.46 to 3.06.  c. 7/03, repeat skeletal survey, no lytic lesions.  d. BM BX 09/08/01 Lymphoplasmacytic lymhoma.  e. Rituxan weekly x four, started 09/25/04.  f. CT 06/18/05: essentially stable with slight decreased size of left pleural effusion.  g. Cycle 3 Rituxan, start 09/24/05.  h. CT 12/28/05: stable pleural effusion.  i. IgM lambda M-spike 2.13 g/dl on 04/28/10, 2.16 g/dl on 10/27/10, 1.84 g/dl on 11/02/11, 2.21 g/dl on 11/21/12, and 2.26 g/dl on 05/22/13.  J. 12/10/14 SPEP 1.67 g/dL.             Malignant lymphoplasmacytic lymphoma (HCC)     HISTORY OF PRESENTING ILLNESS:  [name] 84 y.o.  male with a history of lymphoplasmacytic lymphoma-currently under surveillance; he is here to review the results of the PET scan that was ordered for his T10 paravertebral mass that was noted on MRI that was done for his back fractures/kyphoplasty.  Patient continues to complain of moderate to severe back pain. Patient has chronic multiple falls-attributes to gait instability/age.    Mild weight loss no significant weight loss.  Denies any lumps or bumps.  Denies night sweats.  He  goes around with a rolling walker.    ROS: A complete 10 point review of system is done which is negative except mentioned above in history of present illness  MEDICAL HISTORY:  Past Medical History:  Diagnosis Date  . Anginal pain ([place])    c/o heaviness a few years ago.  stent placed and all resolved  . Anxiety   . BPH (benign prostatic hypertrophy)   . Carotid stenosis    a. 05/2015 Carotid U/S: mild nonobs atherosclerosis. No need for f/u.  Marland Kitchen Chronic back pain   . Coronary artery disease    a. 06/2013 Cath/PCI: LAD 50-70p (FFR 0.82-->PCI w/ 3.5x24 Promus DES), RCA subtotally occluded w/ L->R collats, LCX 30ost.  . Dyslipidemia   . GERD (gastroesophageal reflux disease)   . Hx of [name]   . Hyperlipidemia   . Hypertension   . Inguinal hernia   . Lightheadedness    a. chronic, somewhat positional.  . 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 Major depression   . Pleural effusion    left  . Sleep apnea    will not use cpap    SURGICAL HISTORY: Past Surgical History:  Procedure Laterality Date  . ANTERIOR APPROACH HEMI HIP ARTHROPLASTY Right 04/16/2016   Procedure: ANTERIOR APPROACH HEMI HIP ARTHROPLASTY;  Surgeon: Hessie Knows, MD;  Location: [facility] ORS;  Service: Orthopedics;  Laterality: Right;  . APPENDECTOMY     84 years old  . arm fracture Right 2008  rod in arm  . CARDIAC CATHETERIZATION  2002  . CARDIAC CATHETERIZATION  06/2013   stent placed  . COLONOSCOPY    . EYE SURGERY Bilateral 2008   cataract extraction  . FEMUR FRACTURE SURGERY Left    with rod  . HEMORROIDECTOMY     75 years ago  . HIP FRACTURE SURGERY    . JOINT REPLACEMENT Bilateral 03/2016   right partial knee replacement, bilateral THR  . 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  No alcohol.  Social History   Socioeconomic History  . Marital status: Married    Spouse name: Not on file  . Number of children: Not on file  . Years of education: Not on file  . Highest education level: Not on file  Social Needs  . Financial resource strain: Not on file  . Food insecurity - worry: Not on file  . Food insecurity - inability: Not on file  . Transportation needs - medical: Not on file  . Transportation needs - non-medical: Not on file  Occupational History  . Not on file  Tobacco Use  . Smoking status: Former Smoker    Packs/day: 2.00    Years: 7.00    Pack years: 14.00    Types: Cigarettes    Last attempt to quit: 09/17/1953    Years since quitting: 63.5  . Smokeless tobacco: Never Used  Substance and Sexual Activity  . Alcohol use: No    Alcohol/week: 0.0 oz  . Drug use: No  . Sexual activity: Not Currently  Other Topics Concern  . Not on file  Social History Narrative  . Not on file    FAMILY HISTORY: Family History  Problem Relation Age of Onset  . Heart disease Mother   . Stroke Mother   . Diabetes Father   . Dementia Father   . Stroke Sister   . Stroke Brother   . Stroke Sister   . Stroke Sister   . Stroke Brother   . Stroke Brother     ALLERGIES:  is allergic to cyclobenzaprine; baclofen; nsaids; sulfamethoxazole-trimethoprim; and tolmetin.  MEDICATIONS:  Current Outpatient Medications  Medication Sig Dispense Refill  . acetaminophen (TYLENOL) 500 MG tablet Take 2 tablets (1,000 mg total) by mouth every 6 (six) hours. 30 tablet 0  . clonazePAM (KLONOPIN) 0.5 MG tablet Take 1 tablet (0.5 mg total) by mouth 3 (three) times daily as needed for anxiety. 90 tablet 0  . doxazosin (CARDURA) 1 MG tablet Take 1 tablet (1 mg total) by mouth daily. 90 tablet 0  . fluticasone furoate-vilanterol (BREO ELLIPTA) 100-25 MCG/INH AEPB Inhale 1 puff into the lungs daily. 60 each 5  .  Fluticasone-Salmeterol (ADVAIR DISKUS) 250-50 MCG/DOSE AEPB Inhale 1 puff into the lungs 2 (two) times daily. 60 each 3  . isosorbide mononitrate (IMDUR) 30 MG 24 hr tablet Take 1 tablet (30 mg total) by mouth daily. 90 tablet 0  . metoprolol tartrate (LOPRESSOR) 25 MG tablet Take 1-2 tablets (25-50 mg total) 2 (two) times daily by mouth. Take 2 tablets ('50mg'$ ) in the morning and 1 tablet ('25mg'$ ) at bedtime 270 tablet 0  . Multiple Vitamins-Minerals (PRESERVISION/LUTEIN PO) Take by mouth 2 (two) times daily.    . nitroGLYCERIN (NITROSTAT) 0.4 MG SL tablet Place 0.4 mg under the tongue every  5 (five) minutes as needed for chest pain.    . pantoprazole (PROTONIX) 40 MG tablet Take 1 tablet (40 mg total) by mouth daily. 90 tablet 0  . polyethylene glycol (MIRALAX / GLYCOLAX) packet Take 17 g by mouth daily.    . pravastatin (PRAVACHOL) 40 MG tablet Take 1 tablet (40 mg total) by mouth daily. 90 tablet 0  . sertraline (ZOLOFT) 100 MG tablet Take 1.5 tablets (150 mg total) by mouth at bedtime. 135 tablet 0  . traMADol (ULTRAM) 50 MG tablet Take 25 mg 3 (three) times daily by mouth.     Marland Kitchen albuterol (PROVENTIL HFA;VENTOLIN HFA) 108 (90 Base) MCG/ACT inhaler Inhale 2 puffs every 6 (six) hours as needed into the lungs for wheezing or shortness of breath. (Patient not taking: Reported on 03/18/2017) 1 Inhaler 2  . bacitracin 500 UNIT/GM ointment Apply 1 application topically 2 (two) times daily. (Patient not taking: Reported on 03/18/2017) 15 g 0  . furosemide (LASIX) 40 MG tablet Take 1 tablet (40 mg total) 2 (two) times daily by mouth. 40 mg p.o. twice daily for 3 days followed by 40 mg p.o. daily (Patient not taking: Reported on 03/18/2017) 60 tablet 11  . Saccharomyces boulardii (PROBIOTIC) 250 MG CAPS Take 1 tablet daily at 6 (six) AM by mouth. (Patient not taking: Reported on 03/18/2017) 14 capsule 1   No current facility-administered medications for this visit.       Marland Kitchen  PHYSICAL EXAMINATION: ECOG  PERFORMANCE STATUS: 2 - Symptomatic, <50% confined to bed  Vitals:   03/18/17 0919  BP: 117/61  Pulse: 67  Temp: (!) 97.5 F (36.4 C)  SpO2: 96%   Filed Weights   03/18/17 0919  Weight: 183 lb (83 kg)    GENERAL: [name] male patient.  Accompanied by his wife.  Alert, no distress and comfortable.   Walking with rolling walker EYES: no pallor or icterus OROPHARYNX: no thrush or ulceration; good dentition  NECK: supple, no masses felt LYMPH:  no palpable lymphadenopathy in the cervical, axillary or inguinal regions LUNGS: clear to auscultation and  No wheeze or crackles HEART/CVS: regular rate & rhythm and no murmurs; No lower extremity edema ABDOMEN: abdomen soft, non-tender and normal bowel sounds Musculoskeletal:no cyanosis of digits and no clubbing  PSYCH: alert & oriented x 3 with fluent speech NEURO: no focal motor/sensory deficits SKIN:  no rashes or significant lesions  LABORATORY DATA:  I have reviewed the data as listed Lab Results  Component Value Date   WBC 8.7 03/18/2017   HGB 9.4 (L) 03/18/2017   HCT 29.3 (L) 03/18/2017   MCV 83.3 03/18/2017   PLT 215 03/18/2017   Recent Labs    05/12/16 1119  12/29/16 1245 12/31/16 0609 03/18/17 1024  NA 134*   < > 137 137 137  K 4.7   < > 4.7 4.1 4.7  CL 99*   < > 102 101 102  CO2 28   < > '26 23 26  '$ GLUCOSE 117*   < > 90 126* 118*  BUN 20   < > 28* 30* 29*  CREATININE 1.03   < > 1.33* 1.53* 1.19  CALCIUM 9.3   < > 9.1 8.9 9.1  GFRNONAA >60   < > 45* 38* 52*  GFRAA >60   < > 52* 44* 60*  PROT 8.0  --   --   --  8.3*  ALBUMIN 3.5  --   --   --  3.5  AST  22  --   --   --  23  ALT 10*  --   --   --  11*  ALKPHOS 115  --   --   --  157*  BILITOT 0.6  --   --   --  0.4   < > = values in this interval not displayed.    RADIOGRAPHIC STUDIES: I have personally reviewed the radiological images as listed and agreed with the findings in the report. Mr Thoracic Spine Wo Contrast  Result Date:  02/18/2017 CLINICAL DATA:  Back pain. T10 kyphoplasty 12/30/2016. History of lymphoma and myeloma EXAM: MRI THORACIC SPINE WITHOUT CONTRAST TECHNIQUE: Multiplanar, multisequence MR imaging of the thoracic spine was performed. No intravenous contrast was administered. COMPARISON:  MRI 12/23/2016 FINDINGS: Alignment: Normal alignment. Thoracic kyphosis unchanged from the prior study. Vertebrae: T10 fracture with interval kyphoplasty. Cement in satisfactory position. Cement is surrounded by fluid within the fracture cleft similar to the prior study. Mild loss of height of T10. Extensive bone marrow edema throughout the T10 vertebral body as noted previously. Extensive paravertebral soft tissue swelling slightly increased. Edema in the inferior endplate of T9 mildly increased. Edema superior endplate of I45 increased from the prior study New lesion in the left proximal T7 rib. New areas of bone marrow edema in the pedicles of T11 bilaterally. Question surgically related versus stress fractures Cord:  Normal cord signal.  No cord compression Paraspinal and other soft tissues: Progression of extensive Paraspinous edema surrounding the T10 fracture without fluid collection. Mild epidural soft tissue bilaterally at T10 similar to the prior study. Disc levels: Left paracentral disc protrusion T8-9 with cord flattening unchanged Right paracentral disc protrusion T10-11 with mild cord flattening is unchanged. IMPRESSION: Interval T10 kyphoplasty for fracture. Extensive bone marrow edema throughout the T10 vertebral body with progression of paraspinous soft tissue thickening. Mild epidural thickening at T10 and. Findings are concerning for infection or tumor. Note results of biopsy during kyphoplasty were negative for malignancy. Progression of superior endplate edema at Y09 which may be related to the kyphoplasty procedure. New areas of edema in the T11 pedicle bilaterally possibly related to surgical procedure or stress  fracture. New bone marrow lesion left seventh rib proximally. This could be an area fracture however metastatic disease or myeloma are consideration giving the new finding. Consider follow-up MRI with contrast for further evaluation. Consider repeat imaging guided biopsy of paraspinous soft tissue. These results were called by telephone at the time of interpretation on 02/18/2017 at 10:19 am to Dr. Hessie Knows , who verbally acknowledged these results. Electronically Signed   By: Franchot Gallo M.D.   On: 02/18/2017 10:20   Nm Pet Image Restag (ps) Skull Base To Thigh  Result Date: 03/15/2017 CLINICAL DATA:  Initial treatment strategy for T10 vertebral fracture status post kyphoplasty 12/30/2016, with increasing paraspinal soft tissue at the T10 level on MRI, indeterminate for pathologic fracture versus infection. History of lymphoplasmacytic lymphoma. EXAM: NUCLEAR MEDICINE PET SKULL BASE TO THIGH TECHNIQUE: 12.9 mCi F-18 FDG was injected intravenously. Full-ring PET imaging was performed from the skull base to thigh after the radiotracer. CT data was obtained and used for attenuation correction and anatomic localization. Mediastinal Blood Pool Activity (max SUV): 3.0 FASTING BLOOD GLUCOSE:  Value: 103 mg/dl COMPARISON:  02/18/2017 thoracic spine MRI.  02/19/2016 chest CT. FINDINGS: NECK: No hypermetabolic lymph nodes in the neck. CHEST: No hypermetabolic axillary, mediastinal or hilar lymph nodes. Chronic small to moderate left pleural effusion with smooth  left pleural thickening. There is bandlike consolidation in the left lower lobe adjacent to the pleural effusion/thickening with associated volume loss, low level FDG uptake and surrounding parenchymal banding, unchanged, compatible with rounded atelectasis. No acute consolidative airspace disease, lung masses or significant pulmonary nodules. ABDOMEN/PELVIS: No abnormal hypermetabolic activity within the liver, pancreas, adrenal glands, or spleen. No  hypermetabolic lymph nodes in the abdomen or pelvis. Cholelithiasis. Moderate left colonic diverticulosis. Atherosclerotic nonaneurysmal thoracic aorta. SKELETON: There is a stable moderate T10 vertebral compression fracture status post kyphoplasty. Mild FDG uptake at the site of the T10 vertebral compression fracture is not significantly elevated over the background marrow uptake (max SUV 4.6 at the T10 vertebral compression fracture site versus max SUV 4.5 in the reference L5 vertebral body). No focal skeletal hypermetabolism. There are nondisplaced subacute fractures of the posterior left seventh, eighth, ninth and tenth ribs, which are comminuted at the ninth and tenth rib fractures sites, with associated low-level FDG uptake (non hypermetabolic) and no discrete bone lesions on the CT images within these ribs. IMPRESSION: 1. Mild FDG uptake at the site of the T10 vertebral compression fracture is not significantly elevated over the background marrow FDG uptake as detailed. These findings remain indeterminate for pathologic fracture, although may simply be post-traumatic given the presence of multiple subacute fractures in the adjacent posterior left seventh through tenth ribs. Repeat biopsy was recommended on the 02/18/2017 thoracic spine MRI, and may be pursued as clinically warranted. Alternatively, a repeat thoracic spine MRI may be obtained in 3 months. 2. No hypermetabolic lymphadenopathy. No soft tissue hypermetabolism to suggest lymphoma. 3. Chronic loculated small to moderate left pleural effusion with associated left lower lobe rounded atelectasis. 4. Chronic findings include: Aortic Atherosclerosis (ICD10-I70.0). Cholelithiasis. Moderate left colonic diverticulosis. Electronically Signed   By: Ilona Sorrel M.D.   On: 03/15/2017 10:14   IMPRESSION: Interval T10 kyphoplasty for fracture. Extensive bone marrow edema throughout the T10 vertebral body with progression of paraspinous soft tissue  thickening. Mild epidural thickening at T10 and. Findings are concerning for infection or tumor. Note results of biopsy during kyphoplasty were negative for malignancy.  Progression of superior endplate edema at Q75 which may be related to the kyphoplasty procedure. New areas of edema in the T11 pedicle bilaterally possibly related to surgical procedure or stress fracture.  New bone marrow lesion left seventh rib proximally. This could be an area fracture however metastatic disease or myeloma are consideration giving the new finding.  Consider follow-up MRI with contrast for further evaluation. Consider repeat imaging guided biopsy of paraspinous soft tissue.  These results were called by telephone at the time of interpretation on 02/18/2017 at 10:19 am to Dr. Hessie Knows , who verbally acknowledged these results.   Electronically Signed   By: Franchot Gallo M.D.   On: 02/18/2017 10:20  ASSESSMENT & PLAN:   Malignant lymphoplasmacytic lymphoma ([place]) # MRI jan 2019-T10 paraspinal mass; question epidural extension. no spinal cord involvement.  Recent T10 kyphoplasty-negative for malignancy on biopsy. Jan 2019- PET- non-avid.   # Given history of lymphoplasmacytic lymphoma; recurrent seems to be considered. Question need for biopsy.  Will discuss at the tumor conference; also left a message for Dr. Rudene Christians.   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 Check myeloma labs.  Awaiting labs today.   #Mild anemia multifactorial 9.8; mild leukocytosis-BCR ABL/flow cytometry negative.  #Vertebral fractures-likely osteoporosis.  Patient will benefit from bone density [place].   Will call pt/wife re: the next plan of care;  Bx vs surveillance after discussion with Dr.Menz/tumor conference.  #  [phone]/ home line  #Follow-up to be decided    All questions were answered. The patient knows to call the clinic with any problems, questions or concerns.     Cammie Sickle, MD 03/18/2017 8:19 PM

## 2017-03-19 ENCOUNTER — Telehealth: Payer: Self-pay | Admitting: Internal Medicine

## 2017-03-19 LAB — KAPPA/LAMBDA LIGHT CHAINS
Kappa free light chain: 14.7 mg/L (ref 3.3–19.4)
Kappa, lambda light chain ratio: 1.86 — ABNORMAL HIGH (ref 0.26–1.65)
Lambda free light chains: 7.9 mg/L (ref 5.7–26.3)

## 2017-03-19 NOTE — Telephone Encounter (Signed)
Left message to call us back to discuss the plan.

## 2017-03-22 ENCOUNTER — Telehealth: Payer: Self-pay | Admitting: Internal Medicine

## 2017-03-22 ENCOUNTER — Other Ambulatory Visit: Payer: Self-pay

## 2017-03-22 DIAGNOSIS — C8300 Small cell B-cell lymphoma, unspecified site: Secondary | ICD-10-CM

## 2017-03-22 LAB — MULTIPLE MYELOMA PANEL, SERUM
Albumin SerPl Elph-Mcnc: 3.4 g/dL (ref 2.9–4.4)
Albumin/Glob SerPl: 0.8 (ref 0.7–1.7)
Alpha 1: 0.3 g/dL (ref 0.0–0.4)
Alpha2 Glob SerPl Elph-Mcnc: 0.7 g/dL (ref 0.4–1.0)
B-Globulin SerPl Elph-Mcnc: 0.8 g/dL (ref 0.7–1.3)
Gamma Glob SerPl Elph-Mcnc: 2.7 g/dL — ABNORMAL HIGH (ref 0.4–1.8)
Globulin, Total: 4.5 g/dL — ABNORMAL HIGH (ref 2.2–3.9)
IgA: 23 mg/dL — ABNORMAL LOW (ref 61–437)
IgG (Immunoglobin G), Serum: 331 mg/dL — ABNORMAL LOW (ref 700–1600)
IgM (Immunoglobulin M), Srm: 4201 mg/dL — ABNORMAL HIGH (ref 15–143)
M Protein SerPl Elph-Mcnc: 2.3 g/dL — ABNORMAL HIGH
Total Protein ELP: 7.9 g/dL (ref 6.0–8.5)

## 2017-03-22 NOTE — Telephone Encounter (Signed)
reviewed the labs; mm panel-pending; follow with Dr.menz- and defer to Dr.Menz re: need for bx; Follow up in 3 months/labs/MD- 1 week prior labs.

## 2017-03-22 NOTE — Telephone Encounter (Signed)
Again called pt; left message to call us back.

## 2017-03-29 ENCOUNTER — Ambulatory Visit (INDEPENDENT_AMBULATORY_CARE_PROVIDER_SITE_OTHER): Payer: Medicare Other | Admitting: Family Medicine

## 2017-03-29 ENCOUNTER — Encounter: Payer: Self-pay | Admitting: Family Medicine

## 2017-03-29 DIAGNOSIS — F411 Generalized anxiety disorder: Secondary | ICD-10-CM

## 2017-03-29 DIAGNOSIS — F3342 Major depressive disorder, recurrent, in full remission: Secondary | ICD-10-CM

## 2017-03-29 MED ORDER — CLONAZEPAM 0.5 MG PO TABS: 0.5000 mg | ORAL_TABLET | Freq: Three times a day (TID) | ORAL | 2 refills | Status: DC | PRN

## 2017-03-29 MED ORDER — SERTRALINE HCL 100 MG PO TABS: 150.0000 mg | ORAL_TABLET | Freq: Every day | ORAL | 0 refills | Status: DC

## 2017-03-29 NOTE — Progress Notes (Signed)
Name: [name]   MRN: [id number]    DOB: [birth year]   Date:03/29/2017       Progress Note  Subjective  Chief Complaint  Chief Complaint  Patient presents with  . Medication Refill    patient needs a refill of his clonazepam & sertraline    Depression         This is a chronic problem.  The onset quality is gradual. The problem is unchanged.  Associated symptoms include no decreased concentration, no fatigue, no helplessness, no hopelessness, does not have insomnia, no body aches and not sad.  Past treatments include SSRIs - Selective serotonin reuptake inhibitors.  Previous treatment provided significant relief.  Past medical history includes anxiety.   Anxiety  Presents for follow-up visit. Symptoms include depressed mood, excessive worry, malaise and nervous/anxious behavior. Patient reports no decreased concentration, insomnia or panic. The severity of symptoms is causing significant distress.       Past Medical History:  Diagnosis Date  . Anginal pain ([place])    c/o heaviness a few years ago.  stent placed and all resolved  . Anxiety   . BPH (benign prostatic hypertrophy)   . Carotid stenosis    a. 05/2015 Carotid U/S: mild nonobs atherosclerosis. No need for f/u.  Marland Kitchen Chronic back pain   . Coronary artery disease    a. 06/2013 Cath/PCI: LAD 50-70p (FFR 0.82-->PCI w/ 3.5x24 Promus DES), RCA subtotally occluded w/ L->R collats, LCX 30ost.  . Dyslipidemia   . GERD (gastroesophageal reflux disease)   . Hx of [name]   . Hyperlipidemia   . Hypertension   . Inguinal hernia   . Lightheadedness    a. chronic, somewhat positional.  . 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 Major depression   . Pleural effusion    left  . Sleep apnea    will not use cpap    Past Surgical History:  Procedure Laterality Date  . ANTERIOR APPROACH HEMI HIP ARTHROPLASTY Right 04/16/2016   Procedure: ANTERIOR APPROACH HEMI HIP  ARTHROPLASTY;  Surgeon: Hessie Knows, MD;  Location: [facility] ORS;  Service: Orthopedics;  Laterality: Right;  . APPENDECTOMY     84 years old  . arm fracture Right 2008   rod in arm  . CARDIAC CATHETERIZATION  2002  . CARDIAC CATHETERIZATION  06/2013   stent placed  . COLONOSCOPY    . EYE SURGERY Bilateral 2008   cataract extraction  . FEMUR FRACTURE SURGERY Left    with rod  . HEMORROIDECTOMY     75 years ago  . HIP FRACTURE SURGERY    . JOINT REPLACEMENT Bilateral 03/2016   right partial knee replacement, bilateral THR  . KYPHOPLASTY N/A 12/30/2016   Procedure: [id number];  Surgeon: Hessie Knows, MD;  Location: [facility] ORS;  Service: Orthopedics;  Laterality: N/A;    Family History  Problem Relation Age of Onset  . Heart disease Mother   . Stroke Mother   . Diabetes Father   . Dementia Father   . Stroke Sister   . Stroke Brother   . Stroke Sister   . Stroke Sister   . Stroke Brother   . Stroke Brother     Social History   Socioeconomic History  . Marital status: Married    Spouse name: Not on file  . Number of children: Not on file  . Years of education: Not on file  .  Highest education level: Not on file  Social Needs  . Financial resource strain: Not on file  . Food insecurity - worry: Not on file  . Food insecurity - inability: Not on file  . Transportation needs - medical: Not on file  . Transportation needs - non-medical: Not on file  Occupational History  . Not on file  Tobacco Use  . Smoking status: Former Smoker    Packs/day: 2.00    Years: 7.00    Pack years: 14.00    Types: Cigarettes    Last attempt to quit: 09/17/1953    Years since quitting: 63.5  . Smokeless tobacco: Never Used  Substance and Sexual Activity  . Alcohol use: No    Alcohol/week: 0.0 oz  . Drug use: No  . Sexual activity: Not Currently  Other Topics Concern  . Not on file  Social History Narrative  . Not on file     [name] Medications:  .  ferrous  [id number] C-folic acid (FEROCON) capsule, Take by mouth., Disp: , Rfl:  .  acetaminophen (TYLENOL) 500 MG tablet, Take 2 tablets (1,000 mg total) by mouth every 6 (six) hours., Disp: 30 tablet, Rfl: 0 .  albuterol (PROVENTIL HFA;VENTOLIN HFA) 108 (90 Base) MCG/ACT inhaler, Inhale 2 puffs every 6 (six) hours as needed into the lungs for wheezing or shortness of breath. (Patient not taking: Reported on 03/18/2017), Disp: 1 Inhaler, Rfl: 2 .  bacitracin 500 UNIT/GM ointment, Apply 1 application topically 2 (two) times daily. (Patient not taking: Reported on 03/18/2017), Disp: 15 g, Rfl: 0 .  clonazePAM (KLONOPIN) 0.5 MG tablet, Take 1 tablet (0.5 mg total) by mouth 3 (three) times daily as needed for anxiety., Disp: 90 tablet, Rfl: 0 .  doxazosin (CARDURA) 1 MG tablet, Take 1 tablet (1 mg total) by mouth daily., Disp: 90 tablet, Rfl: 0 .  fluticasone furoate-vilanterol (BREO ELLIPTA) 100-25 MCG/INH AEPB, Inhale 1 puff into the lungs daily., Disp: 60 each, Rfl: 5 .  Fluticasone-Salmeterol (ADVAIR DISKUS) 250-50 MCG/DOSE AEPB, Inhale 1 puff into the lungs 2 (two) times daily., Disp: 60 each, Rfl: 3 .  furosemide (LASIX) 40 MG tablet, Take 1 tablet (40 mg total) 2 (two) times daily by mouth. 40 mg p.o. twice daily for 3 days followed by 40 mg p.o. daily (Patient not taking: Reported on 03/18/2017), Disp: 60 tablet, Rfl: 11 .  isosorbide mononitrate (IMDUR) 30 MG 24 hr tablet, Take 1 tablet (30 mg total) by mouth daily., Disp: 90 tablet, Rfl: 0 .  metoprolol tartrate (LOPRESSOR) 25 MG tablet, Take 1-2 tablets (25-50 mg total) 2 (two) times daily by mouth. Take 2 tablets (50mg ) in the morning and 1 tablet (25mg ) at bedtime, Disp: 270 tablet, Rfl: 0 .  Multiple Vitamins-Minerals (PRESERVISION/LUTEIN PO), Take by mouth 2 (two) times daily., Disp: , Rfl:  .  nitroGLYCERIN (NITROSTAT) 0.4 MG SL tablet, Place 0.4 mg under the tongue every 5 (five) minutes as needed for chest pain., Disp: , Rfl:  .   pantoprazole (PROTONIX) 40 MG tablet, Take 1 tablet (40 mg total) by mouth daily., Disp: 90 tablet, Rfl: 0 .  polyethylene glycol (MIRALAX / GLYCOLAX) packet, Take 17 g by mouth daily., Disp: , Rfl:  .  pravastatin (PRAVACHOL) 40 MG tablet, Take 1 tablet (40 mg total) by mouth daily., Disp: 90 tablet, Rfl: 0 .  Saccharomyces boulardii (PROBIOTIC) 250 MG CAPS, Take 1 tablet daily at 6 (six) AM by mouth. (Patient not taking: Reported on 03/18/2017), Disp: 14  capsule, Rfl: 1 .  sertraline (ZOLOFT) 100 MG tablet, Take 1.5 tablets (150 mg total) by mouth at bedtime., Disp: 135 tablet, Rfl: 0 .  traMADol (ULTRAM) 50 MG tablet, Take 25 mg 3 (three) times daily by mouth. , Disp: , Rfl:   Allergies  Allergen Reactions  . Cyclobenzaprine Other (See Comments)    Hallucination. Mental instability.  DO NOT GIVE ANY MUSCLE RELAXANTS  . Baclofen Other (See Comments)    Confusion and weakness  . Nsaids     Avoids due to liver.  . Sulfamethoxazole-Trimethoprim Nausea Only and Other (See Comments)    Patient unaware of this as an allergy  . Tolmetin Other (See Comments)    Avoids due to liver.it is an nsaid     Review of Systems  Constitutional: Negative for fatigue.  Psychiatric/Behavioral: Positive for depression. Negative for decreased concentration. The patient is nervous/anxious. The patient does not have insomnia.      Objective  Vitals:   03/29/17 1559  BP: 128/72  Pulse: 74  Resp: 18  Temp: 98.6 F (37 C)  TempSrc: Oral  SpO2: 94%    Physical Exam  Constitutional: He is oriented to person, place, and time and well-developed, well-nourished, and in no distress.  HENT:  Head: Normocephalic and atraumatic.  Cardiovascular: Normal rate, regular rhythm and normal heart sounds.  No murmur heard. Pulmonary/Chest: Effort normal and breath sounds normal. He has no wheezes.  Abdominal: Soft. Bowel sounds are normal. There is no tenderness.  Neurological: He is alert and oriented to  person, place, and time.  Psychiatric: Mood, memory, affect and judgment normal.  Nursing note and vitals reviewed.      Assessment & Plan  1. Generalized anxiety disorder Symptoms of anxiety are stable and controlled, will prescribe 47-month supply for clonazepam and will refer to psychiatry for further management - clonazePAM (KLONOPIN) 0.5 MG tablet; Take 1 tablet (0.5 mg total) by mouth 3 (three) times daily as needed for anxiety.  Dispense: 90 tablet; Refill: 2 - Ambulatory referral to Psychiatry  2. Recurrent major depressive disorder, in full remission ([place]) Symptoms of depression in remission, continue on SSRI. - sertraline (ZOLOFT) 100 MG tablet; Take 1.5 tablets (150 mg total) by mouth at bedtime.  Dispense: 135 tablet; Refill: 0 - Ambulatory referral to Psychiatry   Amerika Nourse Asad A. [facility] 03/29/2017 4:30 PM

## 2017-04-06 ENCOUNTER — Encounter: Payer: Self-pay | Admitting: Nurse Practitioner

## 2017-04-06 ENCOUNTER — Ambulatory Visit (INDEPENDENT_AMBULATORY_CARE_PROVIDER_SITE_OTHER): Payer: Medicare Other | Admitting: Nurse Practitioner

## 2017-04-06 VITALS — BP 120/64 | HR 73 | Ht 70.0 in | Wt 179.5 lb

## 2017-04-06 DIAGNOSIS — I10 Essential (primary) hypertension: Secondary | ICD-10-CM

## 2017-04-06 DIAGNOSIS — E782 Mixed hyperlipidemia: Secondary | ICD-10-CM | POA: Diagnosis not present

## 2017-04-06 DIAGNOSIS — I2510 Atherosclerotic heart disease of native coronary artery without angina pectoris: Secondary | ICD-10-CM

## 2017-04-06 NOTE — Progress Notes (Signed)
Office Visit    Patient Name: [name] Date of Encounter: 04/06/2017  Primary Care Provider:  Roselee Nova, MD Primary Cardiologist:  Kathlyn Sacramento, MD  Chief Complaint    84 year old male with a history of [name], hypertension, hyperlipidemia, and chronic back pain, who presents for follow-up.  Past Medical History    Past Medical History:  Diagnosis Date  . Anginal pain ([place])    c/o heaviness a few years ago.  stent placed and all resolved  . Anxiety   . BPH (benign prostatic hypertrophy)   . Carotid stenosis    a. 05/2015 Carotid U/S: mild nonobs atherosclerosis. No need for f/u.  Marland Kitchen Chronic back pain   . Coronary artery disease    a. 06/2013 Cath/PCI: LAD 50-70p (FFR 0.82-->PCI w/ 3.5x24 Promus DES), RCA subtotally occluded w/ L->R collats, LCX 30ost.  . Dyslipidemia   . GERD (gastroesophageal reflux disease)   . Hx of [name]   . Hyperlipidemia   . Hypertension   . Inguinal hernia   . Lightheadedness    a. chronic, somewhat positional.  . 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 Major depression   . Pleural effusion    left  . Sleep apnea    will not use cpap   Past Surgical History:  Procedure Laterality Date  . ANTERIOR APPROACH HEMI HIP ARTHROPLASTY Right 04/16/2016   Procedure: ANTERIOR APPROACH HEMI HIP ARTHROPLASTY;  Surgeon: Hessie Knows, MD;  Location: [facility] ORS;  Service: Orthopedics;  Laterality: Right;  . APPENDECTOMY     84 years old  . arm fracture Right 2008   rod in arm  . CARDIAC CATHETERIZATION  2002  . CARDIAC CATHETERIZATION  06/2013   stent placed  . COLONOSCOPY    . EYE SURGERY Bilateral 2008   cataract extraction  . FEMUR FRACTURE SURGERY Left    with rod  . HEMORROIDECTOMY     75 years ago  . HIP FRACTURE SURGERY    . JOINT REPLACEMENT Bilateral 03/2016   right partial knee replacement, bilateral THR  . KYPHOPLASTY N/A 12/30/2016   Procedure:  [id number];  Surgeon: Hessie Knows, MD;  Location: [facility] ORS;  Service: Orthopedics;  Laterality: N/A;    Allergies  Allergies  Allergen Reactions  . Cyclobenzaprine Other (See Comments)    Hallucination. Mental instability.  DO NOT GIVE ANY MUSCLE RELAXANTS  . Baclofen Other (See Comments)    Confusion and weakness  . Nsaids     Avoids due to liver.  . Sulfamethoxazole-Trimethoprim Nausea Only and Other (See Comments)    Patient unaware of this as an allergy  . Tolmetin Other (See Comments)    Avoids due to liver.it is an nsaid    History of Present Illness    84 year old male with the above complex past medical history including [name] status post PCI and stenting of the LAD in May 2015.  He has a known subtotal occlusion of the right coronary artery and mild disease in the left circumflex.  Other history includes hypertension, hyperlipidemia, somewhat chronic positional lightheadedness, malignant lymphoplasmacytic lymphoma, GERD, and chronic back pain with history of vertebral fractures status post T10 kyphoplasty.  He was last seen in clinic in May 2018.  Since then, he has done well from a cardiac standpoint.  He denies experiencing any chest pain or dyspnea on exertion.  Activity is somewhat limited by chronic back  pain and he does use a walker when walking.  He lives at an assisted living facility in independent living and has to make the tract to the cafeteria several times per day.  This is quite a walk and he is able to do this without limitations other than back pain.  He also participates in physical therapy twice a week and tolerates this well without symptoms or limitations.  He has had several ER visits for a scalp laceration, weakness, lower extremity cellulitis, and most recently for respiratory failure following T10 kyphoplasty in November.  No malignancy on biopsy.  Following kyphoplasty, lower back pain improved but he has been noticing pain under his left shoulder blade.   A follow-up MRI in January showed extensive bone marrow edema throughout the T10 vertebral body with progression of paraspinous soft tissue thickening possibly concerning for tumor.  He has been followed in cancer clinic and is also followed up with orthopedics.  As he has been feeling mostly better, no plan for biopsy at this time.  Home Medications    Prior to Admission medications   Medication Sig Start Date End Date Taking? Authorizing Provider  acetaminophen (TYLENOL) 500 MG tablet Take 2 tablets (1,000 mg total) by mouth every 6 (six) hours. 04/17/16  Yes Dustin Flock, MD  bacitracin 500 UNIT/GM ointment Apply 1 application topically 2 (two) times daily. 11/24/16  Yes Roselee Nova, MD  clonazePAM (KLONOPIN) 0.5 MG tablet Take 1 tablet (0.5 mg total) by mouth 3 (three) times daily as needed for anxiety. 03/29/17  Yes Roselee Nova, MD  doxazosin (CARDURA) 1 MG tablet Take 1 tablet (1 mg total) by mouth daily. 02/15/17  Yes Roselee Nova, MD  ferrous [id number] C-folic acid (FEROCON) capsule Take by mouth. 03/24/17 03/24/18 Yes [provider]  fluticasone furoate-vilanterol (BREO ELLIPTA) 100-25 MCG/INH AEPB Inhale 1 puff into the lungs daily. 03/08/17  Yes Wilhelmina Mcardle, MD  isosorbide mononitrate (IMDUR) 30 MG 24 hr tablet Take 1 tablet (30 mg total) by mouth daily. 02/15/17  Yes Rochel Brome A, MD  metoprolol tartrate (LOPRESSOR) 25 MG tablet Take 25 mg by mouth 2 (two) times daily.   Yes [provider]  Multiple Vitamins-Minerals (PRESERVISION/LUTEIN PO) Take by mouth 2 (two) times daily.   Yes [provider]  nitroGLYCERIN (NITROSTAT) 0.4 MG SL tablet Place 0.4 mg under the tongue every 5 (five) minutes as needed for chest pain.   Yes [provider]  pantoprazole (PROTONIX) 40 MG tablet Take 1 tablet (40 mg total) by mouth daily. 03/16/17  Yes Roselee Nova, MD  polyethylene glycol [facility] / GLYCOLAX) packet Take 17 g by  mouth daily.   Yes [provider]  pravastatin (PRAVACHOL) 40 MG tablet Take 1 tablet (40 mg total) by mouth daily. 02/15/17  Yes Roselee Nova, MD  Saccharomyces boulardii (PROBIOTIC) 250 MG CAPS Take 1 tablet daily at 6 (six) AM by mouth. 12/21/16  Yes Merlyn Lot, MD  sertraline (ZOLOFT) 100 MG tablet Take 1.5 tablets (150 mg total) by mouth at bedtime. 03/29/17  Yes Roselee Nova, MD  traMADol (ULTRAM) 50 MG tablet Take 25 mg 3 (three) times daily by mouth.  07/10/16  Yes [provider]    Review of Systems    Chronic back pain.  He denies chest pain, dyspnea, PND, orthopnea, dizziness, syncope, edema, palpitations, or early satiety.  All other systems reviewed and are otherwise negative except  as noted above.  Physical Exam    VS:  BP 120/64 (BP Location: Left Arm, Patient Position: Sitting, Cuff Size: Normal)   Pulse 73   Ht 5\' 10"  (1.778 m)   Wt 179 lb 8 oz (81.4 kg)   BMI 25.76 kg/m  , BMI Body mass index is 25.76 kg/m. GEN: Well nourished, well developed, in no acute distress.  HEENT: normal.  Neck: Supple, no JVD, carotid bruits, or masses. Cardiac: RRR, no murmurs, rubs, or gallops. No clubbing, cyanosis, edema.  Radials/DP/PT 2+ and equal bilaterally.  Respiratory:  Respirations regular and unlabored, diminished breath sounds in bilateral bases.  GI: Soft, nontender, nondistended, BS + x 4. MS: no deformity or atrophy. Skin: warm and dry, no rash. Neuro:  Strength and sensation are intact. Psych: Normal affect.  Accessory Clinical Findings    ECG -regular sinus rhythm, 73, first-degree AV block, no acute ST or T changes.  Assessment & Plan    1.  Coronary artery disease: Status post prior PCI and drug-eluting stent placement to the LAD in 2015.  He has a known subtotal occlusion of the right coronary artery with left-to-right collaterals and mild nonobstructive left circumflex disease.  He has been doing well without chest pain or  dyspnea.  He remains on aspirin, nitrate, beta-blocker, and statin therapy.  2.  Essential hypertension: Blood pressure stable on beta-blocker therapy.  3.  Hyperlipidemia: Remains on Pravachol.  Last LDL was 23 in January 2018.  Normal LFTs earlier this month.  4.  Disposition: Follow-up with Dr. Fletcher Anon in 6 months or sooner if necessary.  Murray Hodgkins, NP 04/06/2017, 4:39 PM

## 2017-04-06 NOTE — Patient Instructions (Addendum)
Medication Instructions:  Your physician recommends that you continue on your current medications as directed. Please refer to the Current Medication list given to you today.   Labwork: none  Testing/Procedures: none  Follow-Up: Your physician wants you to follow-up in: 6 MONTHS WITH DR ARIDA.  You will receive a reminder letter in the mail two months in advance. If you don't receive a letter, please call our office to schedule the follow-up appointment.  If you need a refill on your cardiac medications before your next appointment, please call your pharmacy.   

## 2017-04-14 ENCOUNTER — Emergency Department
Admission: EM | Admit: 2017-04-14 | Discharge: 2017-04-14 | Disposition: A | Discharge status: Home / Self Care | Payer: Medicare Other | Attending: Emergency Medicine | Admitting: Emergency Medicine

## 2017-04-14 ENCOUNTER — Other Ambulatory Visit: Payer: Self-pay

## 2017-04-14 ENCOUNTER — Emergency Department: Payer: Medicare Other

## 2017-04-14 DIAGNOSIS — W0110XA Fall on same level from slipping, tripping and stumbling with subsequent striking against unspecified object, initial encounter: Secondary | ICD-10-CM | POA: Insufficient documentation

## 2017-04-14 DIAGNOSIS — Y9301 Activity, walking, marching and hiking: Secondary | ICD-10-CM | POA: Diagnosis not present

## 2017-04-14 DIAGNOSIS — Z79899 Other long term (current) drug therapy: Secondary | ICD-10-CM | POA: Diagnosis not present

## 2017-04-14 DIAGNOSIS — Z87891 Personal history of nicotine dependence: Secondary | ICD-10-CM | POA: Diagnosis not present

## 2017-04-14 DIAGNOSIS — S0101XA Laceration without foreign body of scalp, initial encounter: Secondary | ICD-10-CM | POA: Diagnosis not present

## 2017-04-14 DIAGNOSIS — Z96651 Presence of right artificial knee joint: Secondary | ICD-10-CM | POA: Diagnosis not present

## 2017-04-14 DIAGNOSIS — I2510 Atherosclerotic heart disease of native coronary artery without angina pectoris: Secondary | ICD-10-CM | POA: Diagnosis not present

## 2017-04-14 DIAGNOSIS — S0990XA Unspecified injury of head, initial encounter: Secondary | ICD-10-CM | POA: Diagnosis present

## 2017-04-14 DIAGNOSIS — Y9210 Unspecified residential institution as the place of occurrence of the external cause: Secondary | ICD-10-CM | POA: Insufficient documentation

## 2017-04-14 DIAGNOSIS — Z7902 Long term (current) use of antithrombotics/antiplatelets: Secondary | ICD-10-CM | POA: Diagnosis not present

## 2017-04-14 DIAGNOSIS — I10 Essential (primary) hypertension: Secondary | ICD-10-CM | POA: Insufficient documentation

## 2017-04-14 DIAGNOSIS — W19XXXA Unspecified fall, initial encounter: Secondary | ICD-10-CM

## 2017-04-14 DIAGNOSIS — Y999 Unspecified external cause status: Secondary | ICD-10-CM | POA: Insufficient documentation

## 2017-04-14 NOTE — ED Triage Notes (Signed)
Pt comes into the ED via EMS from home at [facility], states he was getting dressed and tripped and fell backward hitting his head,. Denies LOC. Pt is a/ox4 on arrival. Granddaughter is with the pt. Pt has a laceration with controlled bleeding, no active bleeding at this time. State he is not on any blood thinners.

## 2017-04-14 NOTE — Discharge Instructions (Signed)
Please seek medical attention for any high fevers, chest pain, shortness of breath, change in behavior, persistent vomiting, bloody stool or any other new or concerning symptoms.  

## 2017-04-14 NOTE — ED Provider Notes (Signed)
[facility] ([place]) Emergency Department Provider Note   ____________________________________________   I have reviewed the triage vital signs and the nursing notes.   HISTORY  Chief Complaint Fall and Head Injury   History limited by: Not Limited   HPI [name] is a 84 y.o. male who presents to the emergency department today because of concerns for fall.  Patient states that he does have at baseline walking difficulty and balance issues.  States that he got tripped up and fell backwards.  He did hit his head but denies any loss of consciousness.  He denies any significant pain in his head.  He denies any pain in his neck.  No pain in the upper or lower extremities.  He denies any recent illness.  No fevers, no chest pain, no palpitations, no shortness of breath.   Per medical record review patient has a history of HLD, HTN.  Past Medical History:  Diagnosis Date  . Anginal pain ([place])    c/o heaviness a few years ago.  stent placed and all resolved  . Anxiety   . BPH (benign prostatic hypertrophy)   . Carotid stenosis    a. 05/2015 Carotid U/S: mild nonobs atherosclerosis. No need for f/u.  Marland Kitchen Chronic back pain   . Coronary artery disease    a. 06/2013 Cath/PCI: LAD 50-70p (FFR 0.82-->PCI w/ 3.5x24 Promus DES), RCA subtotally occluded w/ L->R collats, LCX 30ost.  . Dyslipidemia   . GERD (gastroesophageal reflux disease)   . Hx of [name]   . Hyperlipidemia   . Hypertension   . Inguinal hernia   . Lightheadedness    a. chronic, somewhat positional.  . 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 Major depression   . Pleural effusion    left  . Sleep apnea    will not use cpap    Patient Active Problem List   Diagnosis Date Noted  . 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 S/P kyphoplasty 01/14/2017  . Respiratory failure, post-operative  ([place]) 12/31/2016  . Acute respiratory failure ([place]) 12/30/2016  . Altered mental status 12/07/2016  . Acute kidney injury ([place]) 09/14/2016  . Head injury, intracranial, without loss of consciousness or fracture ([place]) 09/07/2016  . Groin pain, left 06/15/2016  . Closed right hip fracture, with routine healing, subsequent encounter 04/15/2016  . Productive cough 03/23/2016  . Medicare annual wellness visit, subsequent 02/19/2016  . Major depression 10/14/2015  . Behavior disturbance 09/12/2015  . Mobility impaired 08/27/2015  . Skin ulcer of back ([place]) 07/01/2015  . Laceration of right hand 07/01/2015  . Traumatic hematoma of lower back 07/01/2015  . Encounters for administrative purpose 04/01/2015  . Skin lesion of face 12/24/2014  . Contusion of right hand 11/28/2014  . Herpes zoster 10/30/2014  . Need for immunization against influenza 10/24/2014  . Compression fracture of L4 lumbar vertebra ([facility]) 09/28/2014  . Rectal or anal pain 08/13/2014  . Anxiety disorder 07/23/2014  . BPH without obstruction/lower urinary tract symptoms 07/23/2014  . Atherosclerosis of coronary artery 07/23/2014  . Chronic LBP 07/23/2014  . Hypercholesteremia 07/23/2014  . Benign hypertension 07/23/2014  . Acid reflux 07/23/2014  . Chronic recurrent major depressive disorder ([place]) 07/23/2014  . GERD (gastroesophageal reflux disease) 07/23/2014  . Coronary artery disease involving native coronary artery without angina pectoris 07/06/2014  . Essential hypertension 07/06/2014  . Carotid stenosis  07/06/2014  . Hyperlipidemia   . H/O [name] 12/20/2011    Past Surgical History:  Procedure Laterality Date  . ANTERIOR APPROACH HEMI HIP ARTHROPLASTY Right 04/16/2016   Procedure: ANTERIOR APPROACH HEMI HIP ARTHROPLASTY;  Surgeon: Hessie Knows, MD;  Location: [facility] ORS;  Service: Orthopedics;  Laterality: Right;  . APPENDECTOMY     84 years old  . arm fracture Right 2008   rod in arm  . CARDIAC  CATHETERIZATION  2002  . CARDIAC CATHETERIZATION  06/2013   stent placed  . COLONOSCOPY    . EYE SURGERY Bilateral 2008   cataract extraction  . FEMUR FRACTURE SURGERY Left    with rod  . HEMORROIDECTOMY     75 years ago  . HIP FRACTURE SURGERY    . JOINT REPLACEMENT Bilateral 03/2016   right partial knee replacement, bilateral THR  . KYPHOPLASTY N/A 12/30/2016   Procedure: [id number];  Surgeon: Hessie Knows, MD;  Location: [facility] ORS;  Service: Orthopedics;  Laterality: N/A;    Prior to Admission medications   Medication Sig Start Date End Date Taking? Authorizing Provider  acetaminophen (TYLENOL) 500 MG tablet Take 2 tablets (1,000 mg total) by mouth every 6 (six) hours. 04/17/16   Dustin Flock, MD  bacitracin 500 UNIT/GM ointment Apply 1 application topically 2 (two) times daily. 11/24/16   Roselee Nova, MD  clonazePAM (KLONOPIN) 0.5 MG tablet Take 1 tablet (0.5 mg total) by mouth 3 (three) times daily as needed for anxiety. 03/29/17   Roselee Nova, MD  doxazosin (CARDURA) 1 MG tablet Take 1 tablet (1 mg total) by mouth daily. 02/15/17   Roselee Nova, MD  ferrous [id number] C-folic acid (FEROCON) capsule Take by mouth. 03/24/17 03/24/18  [provider]  fluticasone furoate-vilanterol (BREO ELLIPTA) 100-25 MCG/INH AEPB Inhale 1 puff into the lungs daily. 03/08/17   Wilhelmina Mcardle, MD  isosorbide mononitrate (IMDUR) 30 MG 24 hr tablet Take 1 tablet (30 mg total) by mouth daily. 02/15/17   Roselee Nova, MD  metoprolol tartrate (LOPRESSOR) 25 MG tablet Take 25 mg by mouth 2 (two) times daily.    [provider]  Multiple Vitamins-Minerals (PRESERVISION/LUTEIN PO) Take by mouth 2 (two) times daily.    [provider]  nitroGLYCERIN (NITROSTAT) 0.4 MG SL tablet Place 0.4 mg under the tongue every 5 (five) minutes as needed for chest pain.    [provider]  pantoprazole (PROTONIX) 40 MG tablet Take 1 tablet (40 mg total) by  mouth daily. 03/16/17   Roselee Nova, MD  polyethylene glycol [facility] / Floria Raveling) packet Take 17 g by mouth daily.    [provider]  pravastatin (PRAVACHOL) 40 MG tablet Take 1 tablet (40 mg total) by mouth daily. 02/15/17   Roselee Nova, MD  Saccharomyces boulardii (PROBIOTIC) 250 MG CAPS Take 1 tablet daily at 6 (six) AM by mouth. 12/21/16   Merlyn Lot, MD  sertraline (ZOLOFT) 100 MG tablet Take 1.5 tablets (150 mg total) by mouth at bedtime. 03/29/17   Roselee Nova, MD  traMADol Veatrice Bourbon) 50 MG tablet Take 25 mg 3 (three) times daily by mouth.  07/10/16   [provider]    Allergies Cyclobenzaprine; Baclofen; Nsaids; Sulfamethoxazole-trimethoprim; and Tolmetin  Family History  Problem Relation Age of Onset  . Heart disease Mother   . Stroke Mother   . Diabetes Father   . Dementia Father   . Stroke Sister   .  Stroke Brother   . Stroke Sister   . Stroke Sister   . Stroke Brother   . Stroke Brother     Social History Social History   Tobacco Use  . Smoking status: Former Smoker    Packs/day: 2.00    Years: 7.00    Pack years: 14.00    Types: Cigarettes    Last attempt to quit: 09/17/1953    Years since quitting: 63.6  . Smokeless tobacco: Never Used  Substance Use Topics  . Alcohol use: No    Alcohol/week: 0.0 oz  . Drug use: No    Review of Systems Constitutional: No fever/chills Eyes: No visual changes. [name]: No sore throat. Cardiovascular: Denies chest pain. Respiratory: Denies shortness of breath. Gastrointestinal: No abdominal pain.  No nausea, no vomiting.  No diarrhea.   Genitourinary: Negative for dysuria. Musculoskeletal: Negative for back pain. Skin: Positive for cut in back of head. Neurological: Negative for headaches, focal weakness or numbness.  ____________________________________________   PHYSICAL EXAM:  VITAL SIGNS: ED Triage Vitals [04/14/17 1807]  Enc Vitals Group     BP (!) 145/71     Pulse Rate  85     Resp 17     Temp 98.2 F (36.8 C)     Temp Source Oral     SpO2 95 %     Weight 180 lb (81.6 kg)     Height 5\' 10"  (1.778 m)     Head Circumference      Peak Flow      Pain Score 3    Constitutional: Alert and oriented. Well appearing and in no distress. Eyes: Conjunctivae are normal.  [name]   Head: Normocephalic. Small abrasion to occiput.   Nose: No congestion/rhinnorhea.   Mouth/Throat: Mucous membranes are moist.   Neck: No stridor.  No midline tenderness Hematological/Lymphatic/Immunilogical: No cervical lymphadenopathy. Cardiovascular: Normal rate, regular rhythm.  No murmurs, rubs, or gallops.  Respiratory: Normal respiratory effort without tachypnea nor retractions. Breath sounds are clear and equal bilaterally. No wheezes/rales/rhonchi. Gastrointestinal: Soft and non tender. No rebound. No guarding.  Genitourinary: Deferred Musculoskeletal: Normal range of motion in all extremities. No lower extremity edema. Neurologic:  Normal speech and language. No gross focal neurologic deficits are appreciated.  Skin:  Skin is warm, dry and intact. No rash noted. Psychiatric: Mood and affect are normal. Speech and behavior are normal. Patient exhibits appropriate insight and judgment.  ____________________________________________    LABS (pertinent positives/negatives)  None  ____________________________________________   EKG  None  ____________________________________________    RADIOLOGY  CT head No acute process  ____________________________________________   PROCEDURES  Procedures  ____________________________________________   INITIAL IMPRESSION / ASSESSMENT AND PLAN / ED COURSE  Pertinent labs & imaging results that were available during my care of the patient were reviewed by me and considered in my medical decision making (see chart for details).  Patient presented to the emergency department today because of concerns for fall  and head injury.  CT head was negative.  Patient had no midline neck tenderness.  Patient did have a small abrasion to occiput but no laceration requiring advanced closure.  Will have nurse bandaged.  Discussed care with patient family.   ____________________________________________   FINAL CLINICAL IMPRESSION(S) / ED DIAGNOSES  Final diagnoses:  Fall, initial encounter  Laceration of scalp, initial encounter     Note: This dictation was prepared with Dragon dictation. Any transcriptional errors that result from this process are unintentional     Archie Balboa,  Carter Kitten, MD 04/14/17 2330

## 2017-04-16 ENCOUNTER — Telehealth: Payer: Self-pay | Admitting: Family Medicine

## 2017-04-16 ENCOUNTER — Other Ambulatory Visit: Payer: Self-pay | Admitting: *Deleted

## 2017-04-16 NOTE — Patient Outreach (Signed)
[facility]) Care Management  04/16/2017  [name] [birth year] [id number]   Telephone Screen  Referral Date:  04/16/2017 Referral Source:  [facility] ED Census Reason for Referral:  6 or more ED visits in the past 6 months Insurance:  Medicare   Outreach Attempt:  Successful telephone outreach to patient for telephone screening.  Patient's wife answered and stated RN Health Coach would have to speak with her.  Wife having difficult time understanding conversation.  States they are not longer followed by Dr. Manuella Ghazi for primary care.  Attempted to verify if patient would be seen by Dr. Ancil Boozer with [facility] and wife stated "no they were going to primary care at [facility]".  [facility] services reviewed with wife.  Wife declines screening and [facility] services.  Plan:  RN Health Coach will notify [facility] Assistant of case closure status based on wife declining services at this time.   [place] [phone] [name]

## 2017-04-16 NOTE — Telephone Encounter (Signed)
Called pt to schedule an appt, no answer, no option for VM.

## 2017-04-16 NOTE — Telephone Encounter (Signed)
Please call to make follow up appt with pt's new PCP Dr. Ancil Boozer - it looks like Dr. Manuella Ghazi recommended a follow up in mid-May 2019.

## 2017-05-28 ENCOUNTER — Other Ambulatory Visit: Payer: Self-pay

## 2017-05-28 DIAGNOSIS — N400 Enlarged prostate without lower urinary tract symptoms: Secondary | ICD-10-CM

## 2017-05-28 MED ORDER — DOXAZOSIN MESYLATE 1 MG PO TABS: 1.0000 mg | ORAL_TABLET | Freq: Every day | ORAL | 0 refills | Status: AC

## 2017-05-28 NOTE — Telephone Encounter (Signed)
Refill request for general medication: Doxazosin  Last office visit: 03/29/2017  Last physical exam: None indicated  Follow-ups on file. 06/28/2017

## 2017-06-13 ENCOUNTER — Other Ambulatory Visit: Payer: Self-pay

## 2017-06-13 ENCOUNTER — Inpatient Hospital Stay
Admission: EM | Admit: 2017-06-13 | Discharge: 2017-06-16 | DRG: 187 | Disposition: A | Discharge status: Skilled Nursing Facility | Payer: Medicare Other | Attending: Internal Medicine | Admitting: Internal Medicine

## 2017-06-13 ENCOUNTER — Encounter: Payer: Self-pay | Admitting: Emergency Medicine

## 2017-06-13 ENCOUNTER — Emergency Department: Payer: Medicare Other

## 2017-06-13 DIAGNOSIS — Z882 Allergy status to sulfonamides status: Secondary | ICD-10-CM | POA: Diagnosis not present

## 2017-06-13 DIAGNOSIS — K219 Gastro-esophageal reflux disease without esophagitis: Secondary | ICD-10-CM | POA: Diagnosis present

## 2017-06-13 DIAGNOSIS — M549 Dorsalgia, unspecified: Secondary | ICD-10-CM | POA: Diagnosis present

## 2017-06-13 DIAGNOSIS — F329 Major depressive disorder, single episode, unspecified: Secondary | ICD-10-CM | POA: Diagnosis present

## 2017-06-13 DIAGNOSIS — R0902 Hypoxemia: Secondary | ICD-10-CM | POA: Diagnosis present

## 2017-06-13 DIAGNOSIS — R4182 Altered mental status, unspecified: Secondary | ICD-10-CM | POA: Diagnosis present

## 2017-06-13 DIAGNOSIS — J189 Pneumonia, unspecified organism: Secondary | ICD-10-CM

## 2017-06-13 DIAGNOSIS — W1830XA Fall on same level, unspecified, initial encounter: Secondary | ICD-10-CM | POA: Diagnosis present

## 2017-06-13 DIAGNOSIS — Z96641 Presence of right artificial hip joint: Secondary | ICD-10-CM | POA: Diagnosis present

## 2017-06-13 DIAGNOSIS — Y92009 Unspecified place in unspecified non-institutional (private) residence as the place of occurrence of the external cause: Secondary | ICD-10-CM

## 2017-06-13 DIAGNOSIS — I6529 Occlusion and stenosis of unspecified carotid artery: Secondary | ICD-10-CM | POA: Diagnosis present

## 2017-06-13 DIAGNOSIS — F419 Anxiety disorder, unspecified: Secondary | ICD-10-CM | POA: Diagnosis present

## 2017-06-13 DIAGNOSIS — E440 Moderate protein-calorie malnutrition: Secondary | ICD-10-CM

## 2017-06-13 DIAGNOSIS — Z87891 Personal history of nicotine dependence: Secondary | ICD-10-CM | POA: Diagnosis not present

## 2017-06-13 DIAGNOSIS — I2510 Atherosclerotic heart disease of native coronary artery without angina pectoris: Secondary | ICD-10-CM | POA: Diagnosis present

## 2017-06-13 DIAGNOSIS — Z881 Allergy status to other antibiotic agents status: Secondary | ICD-10-CM | POA: Diagnosis not present

## 2017-06-13 DIAGNOSIS — I10 Essential (primary) hypertension: Secondary | ICD-10-CM | POA: Diagnosis present

## 2017-06-13 DIAGNOSIS — G8929 Other chronic pain: Secondary | ICD-10-CM | POA: Diagnosis present

## 2017-06-13 DIAGNOSIS — E785 Hyperlipidemia, unspecified: Secondary | ICD-10-CM | POA: Diagnosis present

## 2017-06-13 DIAGNOSIS — Z886 Allergy status to analgesic agent status: Secondary | ICD-10-CM | POA: Diagnosis not present

## 2017-06-13 DIAGNOSIS — R531 Weakness: Secondary | ICD-10-CM | POA: Diagnosis present

## 2017-06-13 DIAGNOSIS — Z79899 Other long term (current) drug therapy: Secondary | ICD-10-CM

## 2017-06-13 DIAGNOSIS — J90 Pleural effusion, not elsewhere classified: Principal | ICD-10-CM | POA: Diagnosis present

## 2017-06-13 DIAGNOSIS — K5900 Constipation, unspecified: Secondary | ICD-10-CM

## 2017-06-13 DIAGNOSIS — Z888 Allergy status to other drugs, medicaments and biological substances status: Secondary | ICD-10-CM | POA: Diagnosis not present

## 2017-06-13 DIAGNOSIS — N400 Enlarged prostate without lower urinary tract symptoms: Secondary | ICD-10-CM | POA: Diagnosis present

## 2017-06-13 DIAGNOSIS — G4730 Sleep apnea, unspecified: Secondary | ICD-10-CM | POA: Diagnosis present

## 2017-06-13 DIAGNOSIS — Z8572 Personal history of non-Hodgkin lymphomas: Secondary | ICD-10-CM

## 2017-06-13 LAB — CBC
HCT: 33.7 % — ABNORMAL LOW (ref 40.0–52.0)
Hemoglobin: 11.2 g/dL — ABNORMAL LOW (ref 13.0–18.0)
MCH: 27.7 pg (ref 26.0–34.0)
MCHC: 33.1 g/dL (ref 32.0–36.0)
MCV: 83.7 fL (ref 80.0–100.0)
Platelets: 149 10*3/uL — ABNORMAL LOW (ref 150–440)
RBC: 4.02 MIL/uL — ABNORMAL LOW (ref 4.40–5.90)
RDW: 17.6 % — ABNORMAL HIGH (ref 11.5–14.5)
WBC: 8.8 10*3/uL (ref 3.8–10.6)

## 2017-06-13 LAB — BASIC METABOLIC PANEL
Anion gap: 11 (ref 5–15)
BUN: 18 mg/dL (ref 6–20)
CO2: 23 mmol/L (ref 22–32)
Calcium: 8.8 mg/dL — ABNORMAL LOW (ref 8.9–10.3)
Chloride: 104 mmol/L (ref 101–111)
Creatinine, Ser: 1.37 mg/dL — ABNORMAL HIGH (ref 0.61–1.24)
GFR calc Af Amer: 50 mL/min — ABNORMAL LOW (ref >60)
GFR calc non Af Amer: 43 mL/min — ABNORMAL LOW (ref >60)
Glucose, Bld: 138 mg/dL — ABNORMAL HIGH (ref 65–99)
Potassium: 4 mmol/L (ref 3.5–5.1)
Sodium: 138 mmol/L (ref 135–145)

## 2017-06-13 LAB — URINALYSIS, COMPLETE (UACMP) WITH MICROSCOPIC
Bacteria, UA: NONE SEEN
Bilirubin Urine: NEGATIVE
Glucose, UA: NEGATIVE mg/dL
Hgb urine dipstick: NEGATIVE
Ketones, ur: NEGATIVE mg/dL
Leukocytes, UA: NEGATIVE
Nitrite: NEGATIVE
Protein, ur: NEGATIVE mg/dL
Specific Gravity, Urine: 1.02 (ref 1.005–1.030)
pH: 5 (ref 5.0–8.0)

## 2017-06-13 LAB — MRSA PCR SCREENING: MRSA by PCR: NEGATIVE

## 2017-06-13 LAB — LACTIC ACID, PLASMA: Lactic Acid, Venous: 1.8 mmol/L (ref 0.5–1.9)

## 2017-06-13 MED ORDER — GUAIFENESIN 100 MG/5ML PO SOLN
5.0000 mL | ORAL | Status: DC | PRN
Start: 2017-06-13 — End: 2017-06-16

## 2017-06-13 MED ORDER — CLONAZEPAM 0.5 MG PO TABS
0.5000 mg | ORAL_TABLET | Freq: Three times a day (TID) | ORAL | Status: DC | PRN
  Administered 2017-06-13 – 2017-06-16 (×6): 0.5 mg via ORAL
  Filled 2017-06-13 (×6): qty 1

## 2017-06-13 MED ORDER — SODIUM CHLORIDE 0.9 % IV SOLN
500.0000 mg | Freq: Once | INTRAVENOUS | Status: AC
  Administered 2017-06-13: 500 mg via INTRAVENOUS
  Filled 2017-06-13: qty 500

## 2017-06-13 MED ORDER — ISOSORBIDE MONONITRATE ER 30 MG PO TB24
30.0000 mg | ORAL_TABLET | Freq: Every day | ORAL | Status: DC
  Administered 2017-06-14 – 2017-06-16 (×3): 30 mg via ORAL
  Filled 2017-06-13 (×3): qty 1

## 2017-06-13 MED ORDER — DOCUSATE SODIUM 100 MG PO CAPS
100.0000 mg | ORAL_CAPSULE | Freq: Two times a day (BID) | ORAL | Status: DC | PRN
  Filled 2017-06-13: qty 1

## 2017-06-13 MED ORDER — ACETAMINOPHEN 325 MG PO TABS
650.0000 mg | ORAL_TABLET | Freq: Four times a day (QID) | ORAL | Status: DC | PRN
  Administered 2017-06-13: 650 mg via ORAL
  Filled 2017-06-13: qty 2

## 2017-06-13 MED ORDER — IPRATROPIUM-ALBUTEROL 0.5-2.5 (3) MG/3ML IN SOLN: 3.0000 mL | Freq: Four times a day (QID) | RESPIRATORY_TRACT | Status: DC | PRN

## 2017-06-13 MED ORDER — SERTRALINE HCL 50 MG PO TABS
150.0000 mg | ORAL_TABLET | Freq: Every day | ORAL | Status: DC
  Administered 2017-06-13 – 2017-06-15 (×3): 150 mg via ORAL
  Filled 2017-06-13 (×3): qty 3

## 2017-06-13 MED ORDER — POLYETHYLENE GLYCOL 3350 17 G PO PACK
17.0000 g | PACK | Freq: Every day | ORAL | Status: DC
  Administered 2017-06-16: 17 g via ORAL

## 2017-06-13 MED ORDER — SODIUM CHLORIDE 0.9 % IV SOLN
1.0000 g | INTRAVENOUS | Status: DC
  Administered 2017-06-14: 1 g via INTRAVENOUS
  Filled 2017-06-13: qty 10

## 2017-06-13 MED ORDER — NITROGLYCERIN 0.4 MG SL SUBL: 0.4000 mg | SUBLINGUAL_TABLET | SUBLINGUAL | Status: DC | PRN

## 2017-06-13 MED ORDER — METOPROLOL TARTRATE 25 MG PO TABS
12.5000 mg | ORAL_TABLET | Freq: Two times a day (BID) | ORAL | Status: DC
  Administered 2017-06-13 – 2017-06-16 (×6): 12.5 mg via ORAL
  Filled 2017-06-13 (×6): qty 1

## 2017-06-13 MED ORDER — FE FUMARATE-B12-VIT C-FA-IFC PO CAPS
1.0000 | ORAL_CAPSULE | Freq: Every day | ORAL | Status: DC
  Administered 2017-06-14 – 2017-06-16 (×3): 1 via ORAL
  Filled 2017-06-13 (×3): qty 1

## 2017-06-13 MED ORDER — HEPARIN SODIUM (PORCINE) 5000 UNIT/ML IJ SOLN
5000.0000 [IU] | Freq: Three times a day (TID) | INTRAMUSCULAR | Status: DC
  Administered 2017-06-13 – 2017-06-16 (×8): 5000 [IU] via SUBCUTANEOUS
  Filled 2017-06-13 (×6): qty 1

## 2017-06-13 MED ORDER — DOCUSATE SODIUM 100 MG PO CAPS
100.0000 mg | ORAL_CAPSULE | Freq: Two times a day (BID) | ORAL | Status: DC
  Administered 2017-06-13 – 2017-06-16 (×5): 100 mg via ORAL
  Filled 2017-06-13 (×4): qty 1

## 2017-06-13 MED ORDER — PRAVASTATIN SODIUM 20 MG PO TABS
40.0000 mg | ORAL_TABLET | Freq: Every day | ORAL | Status: DC
  Administered 2017-06-14 – 2017-06-16 (×3): 40 mg via ORAL
  Filled 2017-06-13 (×3): qty 2

## 2017-06-13 MED ORDER — TRAMADOL HCL 50 MG PO TABS
50.0000 mg | ORAL_TABLET | Freq: Two times a day (BID) | ORAL | Status: DC | PRN
  Administered 2017-06-14 – 2017-06-16 (×5): 50 mg via ORAL
  Filled 2017-06-13 (×5): qty 1

## 2017-06-13 MED ORDER — SODIUM CHLORIDE 0.9 % IV BOLUS
500.0000 mL | Freq: Once | INTRAVENOUS | Status: AC
  Administered 2017-06-13: 500 mL via INTRAVENOUS

## 2017-06-13 MED ORDER — DEXTROSE 5 % IV SOLN
250.0000 mg | INTRAVENOUS | Status: DC
  Administered 2017-06-14: 250 mg via INTRAVENOUS
  Filled 2017-06-13: qty 250

## 2017-06-13 MED ORDER — PANTOPRAZOLE SODIUM 40 MG PO TBEC
40.0000 mg | DELAYED_RELEASE_TABLET | Freq: Every day | ORAL | Status: DC
  Administered 2017-06-14 – 2017-06-16 (×3): 40 mg via ORAL
  Filled 2017-06-13 (×3): qty 1

## 2017-06-13 MED ORDER — SODIUM CHLORIDE 0.9 % IV SOLN
1.0000 g | Freq: Once | INTRAVENOUS | Status: AC
  Administered 2017-06-13: 1 g via INTRAVENOUS
  Filled 2017-06-13: qty 10

## 2017-06-13 MED ORDER — RISAQUAD PO CAPS
1.0000 | ORAL_CAPSULE | Freq: Every day | ORAL | Status: DC
  Administered 2017-06-14 – 2017-06-16 (×3): 1 via ORAL
  Filled 2017-06-13 (×3): qty 1

## 2017-06-13 MED ORDER — DOXAZOSIN MESYLATE 1 MG PO TABS
1.0000 mg | ORAL_TABLET | Freq: Every day | ORAL | Status: DC
  Administered 2017-06-13 – 2017-06-15 (×3): 1 mg via ORAL
  Filled 2017-06-13 (×4): qty 1

## 2017-06-13 NOTE — ED Triage Notes (Addendum)
Pt to ED via EMS from [facility] with c/o fever. Per facility pt oral temp was 99.1 and was given 650mg  of tylenol prior to arrival, facility also states pt is congested. Facility also called EMS this am for a mechanical fall but pt refused to be seen, unaware of head injury, denies blood thinners.  Pt in NAD, VSS

## 2017-06-13 NOTE — Progress Notes (Addendum)
Family Meeting Note  Advance Directive:yes  Today a meeting took place with the Patient, spouse and son and daughter.  The following clinical team members were present during this meeting:MD  The following were discussed:Patient's diagnosis: Pneumonia, weakness , Patient's progosis: Unable to determine and Goals for treatment: Continue present management  Patient and family had made it clear that in any adverse event we can give a trial of CPR, defibrillator, noninvasive ventilator, use of medications. But they would not like to have intubation or putting him on ventilator machine.  Additional follow-up to be provided: PT, PMD  Time spent during discussion:20 minutes  Vaughan Basta, MD

## 2017-06-13 NOTE — H&P (Signed)
[facility] NAME: [name]    MR#:  [id number]  DATE OF BIRTH:  1923/02/21  DATE OF ADMISSION:  06/13/2017  PRIMARY CARE PHYSICIAN: Steele Sizer, MD   REQUESTING/REFERRING PHYSICIAN: Siadecki  CHIEF COMPLAINT:   Chief Complaint  Patient presents with  . Fever    HISTORY OF PRESENT ILLNESS: [name]  is a 84 y.o. male with a known history of BPH, anxiety, Carotid stenosis, [name], dyslipidemia, [name], hyperlipidemia, hypertension, inguinal hernia- lives in [facility] facility with his wife. For last 2 days have some cough and worsening weakness. He was trying to go to the cafeteria with his wife and felt very weak and tried to sit down on the chair but lost her balance and wife was supporting him and they both fell down. His wife has sustained a fracture on her hip, and EMS came and they finally brought to both of them to the emergency room for further evaluation. Patient was noted to have some pleural effusion with atelectasis but depending on his symptoms of fever and cough with weakness, ER physician suspected he is having pneumonia and gave to admit to hospitalist team.  PAST MEDICAL HISTORY:   Past Medical History:  Diagnosis Date  . Anginal pain ([place])    c/o heaviness a few years ago.  stent placed and all resolved  . Anxiety   . BPH (benign prostatic hypertrophy)   . Carotid stenosis    a. 05/2015 Carotid U/S: mild nonobs atherosclerosis. No need for f/u.  Marland Kitchen Chronic back pain   . Coronary artery disease    a. 06/2013 Cath/PCI: LAD 50-70p (FFR 0.82-->PCI w/ 3.5x24 Promus DES), RCA subtotally occluded w/ L->R collats, LCX 30ost.  . Dyslipidemia   . GERD (gastroesophageal reflux disease)   . Hx of [name]   . Hyperlipidemia   . Hypertension   . Inguinal hernia   . Lightheadedness    a. chronic, somewhat positional.  . 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 Major depression   . Pleural effusion    left  . Sleep apnea    will not use cpap    PAST SURGICAL HISTORY:  Past Surgical History:  Procedure Laterality Date  . ANTERIOR APPROACH HEMI HIP ARTHROPLASTY Right 04/16/2016   Procedure: ANTERIOR APPROACH HEMI HIP ARTHROPLASTY;  Surgeon: Hessie Knows, MD;  Location: [facility] ORS;  Service: Orthopedics;  Laterality: Right;  . APPENDECTOMY     84 years old  . arm fracture Right 2008   rod in arm  . CARDIAC CATHETERIZATION  2002  . CARDIAC CATHETERIZATION  06/2013   stent placed  . COLONOSCOPY    . EYE SURGERY Bilateral 2008   cataract extraction  . FEMUR FRACTURE SURGERY Left    with rod  . HEMORROIDECTOMY     75 years ago  . HIP FRACTURE SURGERY    . JOINT REPLACEMENT Bilateral 03/2016   right partial knee replacement, bilateral THR  . KYPHOPLASTY N/A 12/30/2016   Procedure: [id number];  Surgeon: Hessie Knows, MD;  Location: [facility] ORS;  Service: Orthopedics;  Laterality: N/A;    SOCIAL HISTORY:  Social History   Tobacco Use  . Smoking status: Former Smoker    Packs/day: 2.00    Years: 7.00    Pack years: 14.00    Types: Cigarettes    Last attempt to quit:  09/17/1953    Years since quitting: 63.7  . Smokeless tobacco: Never Used  Substance Use Topics  . Alcohol use: No    Alcohol/week: 0.0 oz    FAMILY HISTORY:  Family History  Problem Relation Age of Onset  . Heart disease Mother   . Stroke Mother   . Diabetes Father   . Dementia Father   . Stroke Sister   . Stroke Brother   . Stroke Sister   . Stroke Sister   . Stroke Brother   . Stroke Brother     DRUG ALLERGIES:  Allergies  Allergen Reactions  . Cyclobenzaprine Other (See Comments)    Hallucination. Mental instability.  DO NOT GIVE ANY MUSCLE RELAXANTS  . Baclofen Other (See Comments)    Confusion and weakness  . Nsaids     Avoids due to liver.  . Sulfamethoxazole-Trimethoprim Nausea Only and Other (See Comments)     Patient unaware of this as an allergy  . Tolmetin Other (See Comments)    Avoids due to liver.it is an nsaid    REVIEW OF SYSTEMS:   CONSTITUTIONAL: had fever, fatigue or weakness.  EYES: No blurred or double vision.  EARS, NOSE, AND THROAT: No tinnitus or ear pain.  RESPIRATORY: have cough, shortness of breath, no wheezing or hemoptysis.  CARDIOVASCULAR: No chest pain, orthopnea, edema.  GASTROINTESTINAL: No nausea, vomiting, diarrhea or abdominal pain.  GENITOURINARY: No dysuria, hematuria.  ENDOCRINE: No polyuria, nocturia,  HEMATOLOGY: No anemia, easy bruising or bleeding SKIN: No rash or lesion. MUSCULOSKELETAL: No joint pain or arthritis.   NEUROLOGIC: No tingling, numbness, weakness.  PSYCHIATRY: No anxiety or depression.   MEDICATIONS AT HOME:  Prior to Admission medications   Medication Sig Start Date End Date Taking? Authorizing Provider  acidophilus (RISAQUAD) CAPS capsule Take 1 capsule by mouth daily.   Yes [provider]  Cholecalciferol (VITAMIN D3) 5000 units CAPS Take by mouth.   Yes [provider]  clonazePAM (KLONOPIN) 0.5 MG tablet Take 1 tablet (0.5 mg total) by mouth 3 (three) times daily as needed for anxiety. 03/29/17  Yes Roselee Nova, MD  docusate sodium (COLACE) 100 MG capsule Take 100 mg by mouth 2 (two) times daily.   Yes [provider]  doxazosin (CARDURA) 1 MG tablet Take 1 tablet (1 mg total) by mouth daily. 05/28/17  Yes Lada, Satira Anis, MD  ferrous [id number]-E42-PNTIRWE C-folic acid (FEROCON) capsule Take 1 capsule by mouth daily.  03/24/17 03/24/18 Yes [provider]  isosorbide mononitrate (IMDUR) 30 MG 24 hr tablet Take 1 tablet (30 mg total) by mouth daily. 02/15/17  Yes Rochel Brome A, MD  metoprolol tartrate (LOPRESSOR) 25 MG tablet Take 12.5 mg by mouth 2 (two) times daily.    Yes [provider]  Multiple Vitamins-Minerals (PRESERVISION/LUTEIN PO) Take by mouth 2 (two) times daily.   Yes  [provider]  pantoprazole (PROTONIX) 40 MG tablet Take 1 tablet (40 mg total) by mouth daily. 03/16/17  Yes Roselee Nova, MD  pravastatin (PRAVACHOL) 40 MG tablet Take 1 tablet (40 mg total) by mouth daily. 02/15/17  Yes Roselee Nova, MD  sertraline (ZOLOFT) 100 MG tablet Take 1.5 tablets (150 mg total) by mouth at bedtime. Patient taking differently: Take 150 mg by mouth daily.  03/29/17  Yes Roselee Nova, MD  acetaminophen (TYLENOL) 500 MG tablet Take 2 tablets (1,000 mg total) by mouth every 6 (six) hours. 04/17/16   Posey Pronto,  Shreyang, MD  bacitracin 500 UNIT/GM ointment Apply 1 application topically 2 (two) times daily. Patient not taking: Reported on 06/13/2017 11/24/16   Roselee Nova, MD  fluticasone furoate-vilanterol (BREO ELLIPTA) 100-25 MCG/INH AEPB Inhale 1 puff into the lungs daily. Patient not taking: Reported on 06/13/2017 03/08/17   Wilhelmina Mcardle, MD  nitroGLYCERIN (NITROSTAT) 0.4 MG SL tablet Place 0.4 mg under the tongue every 5 (five) minutes as needed for chest pain.    [provider]  polyethylene glycol (MIRALAX / GLYCOLAX) packet Take 17 g by mouth daily.    [provider]  Saccharomyces boulardii (PROBIOTIC) 250 MG CAPS Take 1 tablet daily at 6 (six) AM by mouth. Patient not taking: Reported on 06/13/2017 12/21/16   Merlyn Lot, MD  traMADol (ULTRAM) 50 MG tablet Take 50 mg by mouth 3 (three) times daily.  07/10/16   [provider]      PHYSICAL EXAMINATION:   VITAL SIGNS: Blood pressure (!) 99/58, pulse 67, temperature 98.4 F (36.9 C), temperature source Oral, resp. rate 18, SpO2 95 %.  GENERAL:  84 y.o.-year-old patient lying in the bed with no acute distress.  EYES: Pupils equal, round, reactive to light and accommodation. No scleral icterus. Extraocular muscles intact.  HEENT: Head atraumatic, normocephalic. Oropharynx and nasopharynx clear.  NECK:  Supple, no jugular venous distention. No thyroid  enlargement, no tenderness.  LUNGS: Normal breath sounds bilaterally, no wheezing, some crepitation. No use of accessory muscles of respiration.  CARDIOVASCULAR: S1, S2 normal. No murmurs, rubs, or gallops.  ABDOMEN: Soft, nontender, nondistended. Bowel sounds present. No organomegaly or mass.  EXTREMITIES: No pedal edema, cyanosis, or clubbing.  NEUROLOGIC: Cranial nerves II through XII are intact- except his face is deviated to right side. Muscle strength 4/5 in all extremities. Sensation intact. Gait not checked.  PSYCHIATRIC: The patient is alert and oriented x 3.  SKIN: No obvious rash, lesion, or ulcer.   LABORATORY PANEL:   CBC Recent Labs  Lab 06/13/17 1132  WBC 8.8  HGB 11.2*  HCT 33.7*  PLT 149*  MCV 83.7  MCH 27.7  MCHC 33.1  RDW 17.6*   ------------------------------------------------------------------------------------------------------------------  Chemistries  Recent Labs  Lab 06/13/17 1132  NA 138  K 4.0  CL 104  CO2 23  GLUCOSE 138*  BUN 18  CREATININE 1.37*  CALCIUM 8.8*   ------------------------------------------------------------------------------------------------------------------ CrCl cannot be calculated (Unknown ideal weight.). ------------------------------------------------------------------------------------------------------------------ No results for input(s): TSH, T4TOTAL, T3FREE, THYROIDAB in the last 72 hours.  Invalid input(s): FREET3   Coagulation profile No results for input(s): INR, PROTIME in the last 168 hours. ------------------------------------------------------------------------------------------------------------------- No results for input(s): DDIMER in the last 72 hours. -------------------------------------------------------------------------------------------------------------------  Cardiac Enzymes No results for input(s): CKMB, TROPONINI, MYOGLOBIN in the last 168 hours.  Invalid input(s):  CK ------------------------------------------------------------------------------------------------------------------ Invalid input(s): POCBNP  ---------------------------------------------------------------------------------------------------------------  Urinalysis    Component Value Date/Time   COLORURINE YELLOW (A) 06/13/2017 1341   APPEARANCEUR CLEAR (A) 06/13/2017 1341   APPEARANCEUR Hazy 12/15/2013 1803   LABSPEC 1.020 06/13/2017 1341   LABSPEC 1.028 12/15/2013 1803   PHURINE 5.0 06/13/2017 1341   GLUCOSEU NEGATIVE 06/13/2017 1341   GLUCOSEU Negative 12/15/2013 1803   HGBUR NEGATIVE 06/13/2017 1341   BILIRUBINUR NEGATIVE 06/13/2017 1341   BILIRUBINUR Negative 12/15/2013 1803   KETONESUR NEGATIVE 06/13/2017 1341   PROTEINUR NEGATIVE 06/13/2017 1341   NITRITE NEGATIVE 06/13/2017 1341   LEUKOCYTESUR NEGATIVE 06/13/2017 1341   LEUKOCYTESUR Negative 12/15/2013 1803     RADIOLOGY: Dg Chest Portable  1 View  Result Date: 06/13/2017 CLINICAL DATA:  Fever. EXAM: PORTABLE CHEST 1 VIEW COMPARISON:  12/31/2016 FINDINGS: Moderate left pleural effusion appears mildly increased in size since previous study. Compressive atelectasis again seen in the left mid and lower lung. Right lung remains clear. Heart size is stable. Aortic atherosclerosis. IMPRESSION: Slight increase in left pleural effusion and compressive atelectasis. Electronically Signed   By: Earle Gell M.D.   On: 06/13/2017 13:03    EKG: Orders placed or performed during the hospital encounter of 06/13/17  . ED EKG  . ED EKG  . EKG 12-Lead  . EKG 12-Lead    IMPRESSION AND PLAN:  * pneumonia   We will treat with Rocephin and azithromycin   Cough syrup as needed.  * generalized weakness   This is secondary to pneumonia, get physical therapy evaluation.  * hypertension   Continue home medication including Imdur, metoprolol.  * hyperlipidemia   Continue pravastatin.  * chronic back pain   Continue Tylenol and  tramadol as needed.   All the records are reviewed and case discussed with ED provider. Management plans discussed with the patient, family and they are in agreement.  CODE STATUS: limited Code Status History    Date Active Date Inactive Code Status Order ID Comments User Context   12/30/2016 1523 01/01/2017 2020 DNR [id number]  Loletha Grayer, MD Inpatient   12/07/2016 1758 12/09/2016 1814 Full Code [id number]  Henreitta Leber, MD Inpatient   04/15/2016 1946 04/18/2016 1615 Full Code [id number]  Epifanio Lesches, MD ED    Questions for Most Recent Historical Code Status (Order [id number])    Question Answer Comment   In the event of cardiac or respiratory ARREST Do not call a "code blue"    In the event of cardiac or respiratory ARREST Do not perform Intubation, CPR, defibrillation or ACLS    In the event of cardiac or respiratory ARREST Use medication by any route, position, wound care, and other measures to relive pain and suffering. May use oxygen, suction and manual treatment of airway obstruction as needed for comfort.    Comments nurse may pronounce      Patient's son, and daughter present in the room during her visit  TOTAL TIME TAKING CARE OF THIS PATIENT: 50 minutes.    Vaughan Basta M.D on 06/13/2017   Between 7am to 6pm - Pager - [phone]  After 6pm go to [email or web address] - password [email or web address] [facility]  Office  [phone]  CC: Primary care physician; Steele Sizer, MD   Note: This dictation was prepared with Dragon dictation along with smaller phrase technology. Any transcriptional errors that result from this process are unintentional.
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
## 2017-06-13 NOTE — ED Notes (Signed)
Pt placed on 2L [place] due to sats 88--92% on RA, MD aware
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
## 2017-06-14 ENCOUNTER — Inpatient Hospital Stay: Payer: Medicare Other

## 2017-06-14 LAB — CBC
HCT: 33.5 % — ABNORMAL LOW (ref 40.0–52.0)
Hemoglobin: 11.2 g/dL — ABNORMAL LOW (ref 13.0–18.0)
MCH: 28.1 pg (ref 26.0–34.0)
MCHC: 33.4 g/dL (ref 32.0–36.0)
MCV: 84.2 fL (ref 80.0–100.0)
Platelets: 131 10*3/uL — ABNORMAL LOW (ref 150–440)
RBC: 3.98 MIL/uL — ABNORMAL LOW (ref 4.40–5.90)
RDW: 17.7 % — ABNORMAL HIGH (ref 11.5–14.5)
WBC: 6.1 10*3/uL (ref 3.8–10.6)

## 2017-06-14 LAB — BASIC METABOLIC PANEL
Anion gap: 6 (ref 5–15)
BUN: 16 mg/dL (ref 6–20)
CO2: 27 mmol/L (ref 22–32)
Calcium: 8.7 mg/dL — ABNORMAL LOW (ref 8.9–10.3)
Chloride: 104 mmol/L (ref 101–111)
Creatinine, Ser: 1.18 mg/dL (ref 0.61–1.24)
GFR calc Af Amer: >60 mL/min (ref >60)
GFR calc non Af Amer: 52 mL/min — ABNORMAL LOW (ref >60)
Glucose, Bld: 105 mg/dL — ABNORMAL HIGH (ref 65–99)
Potassium: 4 mmol/L (ref 3.5–5.1)
Sodium: 137 mmol/L (ref 135–145)

## 2017-06-14 LAB — PROCALCITONIN: Procalcitonin: <0.1 ng/mL

## 2017-06-14 NOTE — Evaluation (Signed)
Physical Therapy Evaluation Patient Details Name: [name] MRN: [id number] DOB: 1923-03-21 Today's Date: 06/14/2017   History of Present Illness  Pt admitted for pneumonia. Pt with history of anxiety, [name], bells palsy and HTN. Pt with complaints of cough and weakness. Per chart review, pt was ambulating and tried to sit down, ending up falling on wife who is also admitted to hospital.  Clinical Impression  Pt is a pleasant 84 year old male who was admitted for pneumonia. Pt performs bed mobility, transfers, and ambulation with min assist. Fatigues quickly with limited exertion. Needs cues for safe use of RW as he keeps it too far away from body. High risk for falls due to previous history and current weakness. Pt demonstrates deficits with decreased endurance/strength/balance. Pt is currently not at baseline level and is high risk for recurrent falls. All mobility performed on RA with sats at 89-92%. RN notified. Would benefit from skilled PT to address above deficits and promote optimal return to PLOF; recommend transition to STR upon discharge from acute hospitalization.       Follow Up Recommendations SNF    Equipment Recommendations  Rolling walker with 5" wheels    Recommendations for Other Services       Precautions / Restrictions Precautions Precautions: Fall Restrictions Weight Bearing Restrictions: No      Mobility  Bed Mobility Overal bed mobility: Needs Assistance Bed Mobility: Supine to Sit     Supine to sit: Min assist     General bed mobility comments: needs assist for transitioning to sitting position at EOB. Once seated, able to sit with upright posture. O2 removed for mobility.  Transfers Overall transfer level: Needs assistance Equipment used: Rolling walker (2 wheeled) Transfers: Sit to/from Stand Sit to Stand: Min assist         General transfer comment: needs elevated surface to assist. Felt slightly dizzy upon standing. RW used for  assistance  Ambulation/Gait Ambulation/Gait assistance: Min Wellsite geologist (Feet): 15 Feet Assistive device: Rolling walker (2 wheeled) Gait Pattern/deviations: Step-to pattern     General Gait Details: amulated using RW and slow reciprocal gait pattern. Keeps RW too far away from body, needs cues for safety. Decreased foot clearance noted. O2 sats at 89-92% post exertion, 2L of O2 donned per nursing request.  Stairs            Wheelchair Mobility    Modified Rankin (Stroke Patients Only)       Balance Overall balance assessment: Needs assistance;History of Falls Sitting-balance support: Feet supported Sitting balance-Leahy Scale: Good     Standing balance support: Bilateral upper extremity supported Standing balance-Leahy Scale: Fair                               Pertinent Vitals/Pain Pain Assessment: No/denies pain    Home Living Family/patient expects to be discharged to:: Private residence Living Arrangements: Spouse/significant other Available Help at Discharge: Family Type of Home: Independent living facility Home Access: Level entry     Home Layout: One level Home Equipment: Environmental consultant - 4 wheels      Prior Function Level of Independence: Independent with assistive device(s)         Comments: ambulates with rollater, reports multiple falls recently     Hand Dominance        Extremity/Trunk Assessment   Upper Extremity Assessment Upper Extremity Assessment: Generalized weakness(B UE grossly 4/5)    Lower Extremity Assessment  Lower Extremity Assessment: Generalized weakness(B LE grossly 4/5)       Communication   Communication: HOH  Cognition Arousal/Alertness: Awake/alert Behavior During Therapy: WFL for tasks assessed/performed Overall Cognitive Status: Within Functional Limits for tasks assessed                                        General Comments      Exercises Other Exercises Other  Exercises: supine ther-ex performed on B LE including ankle pumps, hip abd/add, and SLRs. All ther-ex performed x 10 reps on B LE with cga. Safe technique performed   Assessment/Plan    PT Assessment Patient needs continued PT services  PT Problem List Decreased strength;Decreased activity tolerance;Decreased balance;Decreased mobility;Cardiopulmonary status limiting activity       PT Treatment Interventions Gait training;Therapeutic exercise;Balance training;DME instruction    PT Goals (Current goals can be found in the Care Plan section)  Acute Rehab PT Goals Patient Stated Goal: to go home PT Goal Formulation: With patient Time For Goal Achievement: 06/28/17 Potential to Achieve Goals: Good    Frequency Min 2X/week   Barriers to discharge        Co-evaluation               AM-PAC PT "6 Clicks" Daily Activity  Outcome Measure Difficulty turning over in bed (including adjusting bedclothes, sheets and blankets)?: Unable Difficulty moving from lying on back to sitting on the side of the bed? : Unable Difficulty sitting down on and standing up from a chair with arms (e.g., wheelchair, bedside commode, etc,.)?: Unable Help needed moving to and from a bed to chair (including a wheelchair)?: A Little Help needed walking in hospital room?: A Little Help needed climbing 3-5 steps with a railing? : A Lot 6 Click Score: 11    End of Session Equipment Utilized During Treatment: Gait belt;Oxygen Activity Tolerance: Patient tolerated treatment well Patient left: in chair;with chair alarm set;with SCD's reapplied Nurse Communication: Mobility status PT Visit Diagnosis: Muscle weakness (generalized) (M62.81);Difficulty in walking, not elsewhere classified (R26.2)    Time: 6389-3734 PT Time Calculation (min) (ACUTE ONLY): 25 min   Charges:   PT Evaluation $PT Eval Low Complexity: 1 Low PT Treatments $Therapeutic Exercise: 8-22 mins   PT G Codes:        Greggory Stallion,  PT, DPT [phone]   Leonardo Plaia 06/14/2017, 11:59 AM

## 2017-06-14 NOTE — Progress Notes (Signed)
[facility] [place] NAME: [name]    MR#:  [id number]  DATE OF BIRTH:  February 08, 1924  SUBJECTIVE:   Patient came in after mechanical fall at home. Denies any chest pain. He has some clear flab and cough. Complains of shortness of breath on and off. Has known history of pleural effusion according to the patient a long time ago. REVIEW OF SYSTEMS:   Review of Systems  Constitutional: Negative for chills, fever and weight loss.  HENT: Negative for ear discharge, ear pain and nosebleeds.   Eyes: Negative for blurred vision, pain and discharge.  Respiratory: Positive for sputum production and shortness of breath. Negative for wheezing and stridor.   Cardiovascular: Negative for chest pain, palpitations, orthopnea and PND.  Gastrointestinal: Negative for abdominal pain, diarrhea, nausea and vomiting.  Genitourinary: Negative for frequency and urgency.  Musculoskeletal: Positive for back pain. Negative for joint pain.  Neurological: Positive for weakness. Negative for sensory change, speech change and focal weakness.  Psychiatric/Behavioral: Negative for depression and hallucinations. The patient is not nervous/anxious.    Tolerating Diet:yes Tolerating PT: rehab  DRUG ALLERGIES:   Allergies  Allergen Reactions  . Cyclobenzaprine Other (See Comments)    Hallucination. Mental instability.  DO NOT GIVE ANY MUSCLE RELAXANTS  . Baclofen Other (See Comments)    Confusion and weakness  . Nsaids     Avoids due to liver.  . Sulfamethoxazole-Trimethoprim Nausea Only and Other (See Comments)    Patient unaware of this as an allergy  . Tolmetin Other (See Comments)    Avoids due to liver.it is an nsaid    VITALS:  Blood pressure (!) 153/76, pulse 69, temperature 97.6 F (36.4 C), temperature source Oral, resp. rate 17, SpO2 99 %.  PHYSICAL EXAMINATION:   Physical Exam  GENERAL:  84 y.o.-year-old patient lying in the bed with no acute  distress.  EYES: Pupils equal, round, reactive to light and accommodation. No scleral icterus. Extraocular muscles intact.  HEENT: Head atraumatic, normocephalic. Oropharynx and nasopharynx clear.  NECK:  Supple, no jugular venous distention. No thyroid enlargement, no tenderness.  LUNGS: decreased breath sounds bilaterally left > right, no wheezing, rales, rhonchi. No use of accessory muscles of respiration.  CARDIOVASCULAR: S1, S2 normal. No murmurs, rubs, or gallops.  ABDOMEN: Soft, nontender, nondistended. Bowel sounds present. No organomegaly or mass.  EXTREMITIES: No cyanosis, clubbing or edema b/l.    NEUROLOGIC: Cranial nerves II through XII are intact. No focal Motor or sensory deficits b/l.   PSYCHIATRIC:  patient is alert and oriented x 3.  SKIN: No obvious rash, lesion, or ulcer.   LABORATORY PANEL:  CBC Recent Labs  Lab 06/14/17 0328  WBC 6.1  HGB 11.2*  HCT 33.5*  PLT 131*    Chemistries  Recent Labs  Lab 06/14/17 0328  NA 137  K 4.0  CL 104  CO2 27  GLUCOSE 105*  BUN 16  CREATININE 1.18  CALCIUM 8.7*   Cardiac Enzymes No results for input(s): TROPONINI in the last 168 hours. RADIOLOGY:  Dg Chest Portable 1 View  Result Date: 06/13/2017 CLINICAL DATA:  Fever. EXAM: PORTABLE CHEST 1 VIEW COMPARISON:  12/31/2016 FINDINGS: Moderate left pleural effusion appears mildly increased in size since previous study. Compressive atelectasis again seen in the left mid and lower lung. Right lung remains clear. Heart size is stable. Aortic atherosclerosis. IMPRESSION: Slight increase in left pleural effusion and compressive atelectasis. Electronically Signed   By: Jenny Reichmann  Kris Hartmann M.D.   On: 06/13/2017 13:03   ASSESSMENT AND PLAN:   [name]  is a 84 y.o. male with a known history of BPH, anxiety, Carotid stenosis, [name], dyslipidemia, [name], hyperlipidemia, hypertension, inguinal hernia- lives in [facility] assisted [facility] facility with his wife. For last 2 days have  some cough and worsening weakness. He was trying to go to the cafeteria with his wife and felt very weak and tried to sit down on the chair but lost her balance and wife was supporting him and they both fell down.  * generalized weakness with increasing shortness of breath suspected due to worsening the left known history of pleural effusion -pt said he had resented this is done many years ago.--- Etiology unclear -given his shortness of breath and reason for with weakness I discussed with patient and also left a message for patients on on the phone to get ultrasound-guided thoracentesis done tomorrow. Patient is agreeable. -No evidence of pneumonia on workup. Patient has clinically no signs symptoms of pneumonia. Will DC antibiotics. Procalcitonin negative.  * hypertension   Continue home medication including Imdur, metoprolol.  * hyperlipidemia   Continue pravastatin.  * chronic back pain   Continue Tylenol and tramadol as needed.  *Weakness with fall. Physical therapy to see patient.  Case discussed with Care Management/Social Worker. Management plans discussed with the patient, family and they are in agreement.  CODE STATUS: partial  DVT Prophylaxis: lovenox  TOTAL TIME TAKING CARE OF THIS PATIENT: *30* minutes.  >50% time spent on counselling and coordination of care  POSSIBLE D/C IN *1-2 DAYS, DEPENDING ON CLINICAL CONDITION.  Note: This dictation was prepared with Dragon dictation along with smaller phrase technology. Any transcriptional errors that result from this process are unintentional.  Fritzi Mandes M.D on 06/14/2017 at 3:34 PM  Between 7am to 6pm - Pager - [phone]  After 6pm go to [email or web address] - password [email or web address] [facility]  Office  [phone]  CC: Primary care physician; Steele Sizer, MDPatient ID: [name], male   DOB: [birth year], 84 y.o.   MRN: [id number]

## 2017-06-14 NOTE — Care Management (Addendum)
Patient from [facility]. Patient uses a walker. Patient active with [facility] for PT. Will add RN at DC. Marland Kitchen

## 2017-06-14 NOTE — Care Management (Signed)
[facility] notified of patient going to SNF.

## 2017-06-14 NOTE — Clinical Social Work Placement (Signed)
   CLINICAL SOCIAL WORK PLACEMENT  NOTE  Date:  06/14/2017  Patient Details  Name: [name] MRN: [id number] Date of Birth: 1923-06-06  Clinical Social Work is seeking post-discharge placement for this patient at the [place] level of care (*CSW will initial, date and re-position this form in  chart as items are completed):  Yes   Patient/family provided with [facility] Work Department's list of facilities offering this level of care within the geographic area requested by the patient (or if unable, by the patient's family).  Yes   Patient/family informed of their freedom to choose among providers that offer the needed level of care, that participate in Medicare, Medicaid or managed care program needed by the patient, have an available bed and are willing to accept the patient.      Patient/family informed of [facility]'s ownership interest in [facility] and [facility], as well as of the fact that they are under no obligation to receive care at these facilities.  PASRR submitted to EDS on       PASRR number received on       Existing PASRR number confirmed on 06/14/17     FL2 transmitted to all facilities in geographic area requested by pt/family on 06/14/17     FL2 transmitted to all facilities within larger geographic area on       Patient informed that his/her managed care company has contracts with or will negotiate with certain facilities, including the following:            Patient/family informed of bed offers received.  Patient chooses bed at       Physician recommends and patient chooses bed at      Patient to be transferred to   on  .  Patient to be transferred to facility by       Patient family notified on   of transfer.  Name of family member notified:        PHYSICIAN       Additional Comment:    _______________________________________________ Annamaria Boots, [place] 06/14/2017, 12:22 PM

## 2017-06-14 NOTE — Clinical Social Work Note (Signed)
Clinical Social Work Assessment  Patient Details  Name: [name] MRN: [id number] Date of Birth: February 26, 1923  Date of referral:  06/14/17               Reason for consult:  Facility Placement                Permission sought to share information with:  Chartered certified accountant granted to share information::  Yes, Verbal Permission Granted  Name::      [place]::   [place]   Relationship::     Contact Information:     Housing/Transportation Living arrangements for the past 2 months:  [place] of Information:  Patient, Adult Children, Spouse Patient Interpreter Needed:  None Criminal Activity/Legal Involvement Pertinent to Current Situation/Hospitalization:  No - Comment as needed Significant Relationships:  Adult Children, Spouse Lives with:  Spouse Do you feel safe going back to the place where you live?  Yes Need for family participation in patient care:  Yes (Comment)  Care giving concerns:  Patient lives at [facility] independent living in [place] with his wife [name].    Social Worker assessment / plan:  Holiday representative (CSW) reviewed chart and noted that PT is recommended SNF. CSW met with patient and his daughter Freda Munro and son Marya Amsler were at bedside. Patient was alert and oriented X3 and was sitting up in the chair at bedside. CSW introduced self and explained role of CSW department. Patient reported that him and his wife live at [name] independent living and will need placement in a facility. 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 CSW explained to patient and his son Marya Amsler and daughter Freda Munro SNF placement process and that medicare requires a 3 night qualfying inpatient stay in a hospital in order to pay for SNF. Patient was admitted to inpatient on 06/13/17. Patient's wife [name] is also hospitalized at [facility] however she is under observation. CSW explained to patient and his  adult children that patient can go to short term rehab at a SNF under medicare however medicare will not pay for patient's wife [name] to go to SNF. Patient's son Marya Amsler and daughter Freda Munro verbalized their understanding. CSW explained private pay options for SNF however per adult children patient and his wife can't afford private pay at a SNF.   Plan is for patient to D/C to SNF for short term rehab under medicare and patient's wife [name] will D/C home back to [facility] independent living with home health. CSW discussed long term care placement options with adult children and provided an ALF list. Per patient's son Marya Amsler patient and his wife can afford to private pay for ALF for 4-6 months and then they will apply for medicaid. Marya Amsler and Freda Munro will look into ALFs while patient is at rehab and wife is at home doing home health. FL2 complete and faxed out.   CSW presented bed offers to patient and his family. They chose [facility]. [facility] admissions coordinator at [facility] is aware of accepted bed offer.  Employment status:  Disabled (Comment on whether or not currently receiving Disability), Retired Forensic scientist:  Medicare PT Recommendations:  [place] / Referral to community resources:  [place]  Patient/Family's Response to care:  Patient is agreeable to D/C to SNF.   Patient/Family's Understanding of and Emotional Response to Diagnosis, Current Treatment, and Prognosis:  Patient and his wife  were very pleasant and thanked CSW for assistance.   Emotional Assessment Appearance:  Appears stated age Attitude/Demeanor/Rapport:    Affect (typically observed):  Accepting, Pleasant, Adaptable Orientation:  Oriented to Self, Oriented to Place, Oriented to  Time, Oriented to Situation Alcohol / Substance use:  Not Applicable Psych involvement (Current and /or in the community):  No (Comment)  Discharge Needs  Concerns to be  addressed:  Discharge Planning Concerns Readmission within the last 30 days:  No Current discharge risk:  Dependent with Mobility Barriers to Discharge:  Continued Medical Work up   [employer], Veronia Beets, LCSW 06/14/2017, 2:39 PM

## 2017-06-14 NOTE — Progress Notes (Signed)
Initial Nutrition Assessment  DOCUMENTATION CODES:   Non-severe (moderate) malnutrition in context of chronic illness  INTERVENTION:  Recommend liberalizing diet to regular.  Provide Magic cup TID with meals, each supplement provides 290 kcal and 9 grams of protein.  Recommend obtaining weight and height.  NUTRITION DIAGNOSIS:   Moderate Malnutrition related to chronic illness(advanced age) as evidenced by moderate fat depletion, mild muscle depletion, moderate muscle depletion.  GOAL:   Patient will meet greater than or equal to 90% of their needs  MONITOR:   PO intake, Supplement acceptance, Labs, Weight trends, I & O's  REASON FOR ASSESSMENT:   Malnutrition Screening Tool    ASSESSMENT:   84 year old male with PMHx of carotid stenosis, [name], [name], HTN, anxiety, GERD, BPH, depression, HLD who is admitted after a fall and found to have PNA, generalized weakness.   Met with patient in room. He reports he has had a poor appetite for a while now. He reports he goes down to cafeteria for 3 meals each day, but only eats a small amount of each meal. He reports he is just not as hungry anymore. He is not typically on a special diet. He reports some issues with chewing due to [name] and weakness on right side of face, however patient does not want his diet downgraded. He does not want any oral nutrition supplements. He does like ice cream.  No weight or height entered from this admission yet. Per chart patient was '5\' 10"'$  and 179.5 lbs on 04/06/2017. RD could not obtain bed scale weight as patient was sitting on chair at time of assessment. Patient reports he thinks he is weight stable, but is unsure of his weight trend.  Medications reviewed and include: acidophilus 1 capsule daily, Colace, Trinsicon/Foltrin capsule once daily (ferrous EGBTDVVO/H60/VPX C/ folic acid), pantoprazole, Miralax, sertraline.  Labs reviewed and none pertinent at this time.  NUTRITION - FOCUSED  PHYSICAL EXAM:    Most Recent Value  Orbital Region  Moderate depletion  Upper Arm Region  Severe depletion  Thoracic and Lumbar Region  Moderate depletion  Buccal Region  Moderate depletion  Temple Region  Moderate depletion  Clavicle Bone Region  Mild depletion  Clavicle and Acromion Bone Region  Mild depletion  Scapular Bone Region  Unable to assess  Dorsal Hand  Moderate depletion  Patellar Region  Unable to assess  Anterior Thigh Region  Unable to assess  Posterior Calf Region  Unable to assess  Edema (RD Assessment)  Unable to assess  Hair  Reviewed  Eyes  Reviewed  Mouth  Reviewed  Skin  Reviewed  Nails  Reviewed     Diet Order:  Diet Heart Room service appropriate? Yes; Fluid consistency: Thin  EDUCATION NEEDS:   No education needs have been identified at this time  Skin:  Skin Assessment: Reviewed RN Assessment(scattered ecchymosis; skin tear right elbow)  Last BM:  06/14/2017 - medium type 5  Height:   Ht Readings from Last 1 Encounters:  04/14/17 '5\' 10"'$  (1.778 m)    Weight:   Wt Readings from Last 1 Encounters:  04/14/17 180 lb (81.6 kg)    Ideal Body Weight:  75.5 kg  BMI:  There is no height or weight on file to calculate BMI.  Estimated Nutritional Needs:   Kcal:  1780-1930 (MSJ x 1.2-1.3)  Protein:  80-90 grams (1-1.1 grams/kg)  Fluid:  1.5-1.8 L/day  Willey Blade, MS, RD, LDN Office: [phone] Pager: [phone] After Hours/Weekend Pager: [phone]

## 2017-06-14 NOTE — Plan of Care (Signed)

## 2017-06-14 NOTE — NC FL2 (Addendum)
[place] LEVEL OF CARE SCREENING TOOL     IDENTIFICATION  Patient Name: [name] Birthdate: 09-26-1923 Sex: male Admission Date (Current Location): 06/13/2017  [place] and [place] Number:  Engineering geologist and Address:  [facility], [address] [place], [place] [address]      Provider Number: [id number]  Attending Physician Name and Address:  Fritzi Mandes, MD  Relative Name and Phone Number:       Current Level of Care: Hospital Recommended Level of Care: [place] Prior Approval Number:    Date Approved/Denied:   PASRR Number: [id number]  Discharge Plan: SNF    Current Diagnoses: Patient Active Problem List   Diagnosis Date Noted  . Pneumonia 06/13/2017  . 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 S/P kyphoplasty 01/14/2017  . Respiratory failure, post-operative ([place]) 12/31/2016  . Acute respiratory failure ([place]) 12/30/2016  . Altered mental status 12/07/2016  . Acute kidney injury ([place]) 09/14/2016  . Head injury, intracranial, without loss of consciousness or fracture ([place]) 09/07/2016  . Groin pain, left 06/15/2016  . Closed right hip fracture, with routine healing, subsequent encounter 04/15/2016  . Productive cough 03/23/2016  . Medicare annual wellness visit, subsequent 02/19/2016  . Major depression 10/14/2015  . Behavior disturbance 09/12/2015  . Mobility impaired 08/27/2015  . Skin ulcer of back ([place]) 07/01/2015  . Laceration of right hand 07/01/2015  . Traumatic hematoma of lower back 07/01/2015  . Encounters for administrative purpose 04/01/2015  . Skin lesion of face 12/24/2014  . Contusion of right hand 11/28/2014  . Herpes zoster 10/30/2014  . Need for immunization against influenza 10/24/2014  . Compression fracture of L4 lumbar vertebra 09/28/2014  . Rectal or anal pain 08/13/2014  . Anxiety disorder 07/23/2014  . BPH without  obstruction/lower urinary tract symptoms 07/23/2014  . Atherosclerosis of coronary artery 07/23/2014  . Chronic LBP 07/23/2014  . Hypercholesteremia 07/23/2014  . Benign hypertension 07/23/2014  . Acid reflux 07/23/2014  . Chronic recurrent major depressive disorder ([place]) 07/23/2014  . GERD (gastroesophageal reflux disease) 07/23/2014  . Coronary artery disease involving native coronary artery without angina pectoris 07/06/2014  . Essential hypertension 07/06/2014  . Carotid stenosis 07/06/2014  . Hyperlipidemia   . H/O [name] 12/20/2011    Orientation RESPIRATION BLADDER Height & Weight     Self, Time, Place  O2(2 liters ) Incontinent Weight:   Height:     BEHAVIORAL SYMPTOMS/MOOD NEUROLOGICAL BOWEL NUTRITION STATUS  (None) (None) Incontinent Diet(Heart Healthy )  AMBULATORY STATUS COMMUNICATION OF NEEDS Skin   Extensive Assist Verbally Normal                       Personal Care Assistance Level of Assistance  Bathing, Feeding, Dressing Bathing Assistance: Limited assistance Feeding assistance: Independent Dressing Assistance: Limited assistance     Functional Limitations Info  (None)          SPECIAL CARE FACTORS FREQUENCY  PT (By licensed PT), OT (By licensed OT)(IV antibotics)     PT Frequency: 5  OT Frequency: 5            Contractures Contractures Info: Not present    Additional Factors Info  Code Status, Allergies Code Status Info: Partial code  Allergies Info: Cyclobenzaprine, Baclofen, Sulfamethoxazole-trimethoprim, Tolmetin, NSaids            Current Medications (06/14/2017):  This is the current hospital active medication list Current  Facility-Administered Medications  Medication Dose Route Frequency Provider Last Rate Last Dose  . acetaminophen (TYLENOL) tablet 650 mg  650 mg Oral Q6H PRN Vaughan Basta, MD   650 mg at 06/13/17 2150  . acidophilus (RISAQUAD) capsule 1 capsule  1 capsule Oral Daily Vaughan Basta,  MD   1 capsule at 06/14/17 0938  . azithromycin (ZITHROMAX) 250 mg in dextrose 5 % 125 mL IVPB  250 mg Intravenous Q24H Vaughan Basta, MD   Stopped at 06/14/17 1130  . cefTRIAXone (ROCEPHIN) 1 g in sodium chloride 0.9 % 100 mL IVPB  1 g Intravenous Q24H Vaughan Basta, MD   Stopped at 06/14/17 1010  . clonazePAM (KLONOPIN) tablet 0.5 mg  0.5 mg Oral TID PRN Vaughan Basta, MD   0.5 mg at 06/13/17 2150  . docusate sodium (COLACE) capsule 100 mg  100 mg Oral BID Vaughan Basta, MD   100 mg at 06/14/17 0938  . docusate sodium (COLACE) capsule 100 mg  100 mg Oral BID PRN Vaughan Basta, MD      . doxazosin (CARDURA) tablet 1 mg  1 mg Oral Daily Vaughan Basta, MD   1 mg at 06/13/17 2319  . ferrous [id number] C-folic acid (TRINSICON / FOLTRIN) capsule 1 capsule  1 capsule Oral Daily Vaughan Basta, MD   1 capsule at 06/14/17 0940  . guaiFENesin (ROBITUSSIN) 100 MG/5ML solution 100 mg  5 mL Oral Q4H PRN Vaughan Basta, MD      . heparin injection 5,000 Units  5,000 Units Subcutaneous Q8H Vaughan Basta, MD   5,000 Units at 06/14/17 0503  . ipratropium-albuterol (DUONEB) 0.5-2.5 (3) MG/3ML nebulizer solution 3 mL  3 mL Nebulization Q6H PRN Vaughan Basta, MD      . isosorbide mononitrate (IMDUR) 24 hr tablet 30 mg  30 mg Oral Daily Vaughan Basta, MD   30 mg at 06/14/17 0938  . metoprolol tartrate (LOPRESSOR) tablet 12.5 mg  12.5 mg Oral BID Vaughan Basta, MD   12.5 mg at 06/14/17 0938  . nitroGLYCERIN (NITROSTAT) SL tablet 0.4 mg  0.4 mg Sublingual Q5 min PRN Vaughan Basta, MD      . pantoprazole (PROTONIX) EC tablet 40 mg  40 mg Oral QAC breakfast Vaughan Basta, MD   40 mg at 06/14/17 0938  . polyethylene glycol (MIRALAX / GLYCOLAX) packet 17 g  17 g Oral Daily Vaughan Basta, MD      . pravastatin (PRAVACHOL) tablet 40 mg  40 mg Oral Daily Vaughan Basta, MD   40  mg at 06/14/17 0938  . sertraline (ZOLOFT) tablet 150 mg  150 mg Oral QHS Vaughan Basta, MD   150 mg at 06/13/17 2149  . traMADol (ULTRAM) tablet 50 mg  50 mg Oral Q12H PRN Vaughan Basta, MD   50 mg at 06/14/17 0944     Discharge Medications: Please see discharge summary for a list of discharge medications.  Relevant Imaging Results:  Relevant Lab Results:   Additional Information SSN: [id number]  Annamaria Boots, [place]

## 2017-06-14 NOTE — Progress Notes (Signed)
Chaplain rounded and was referred by Pt wife in another room. Pt was not feeling very positive. Chaplain practiced the ministry of presence and active listening. Pt talked about his life review in religious context. He talked about his theological percepts and life. 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 Chaplain offered prayer for creators presences and comfort. Chaplain asked Pt for wisdom of ministry. Chaplain received sage advice to let pt know his wisdom will live on.    06/14/17 1400  Clinical Encounter Type  Visited With Patient  Visit Type Initial;Spiritual support  Referral From [facility]

## 2017-06-15 DIAGNOSIS — E440 Moderate protein-calorie malnutrition: Secondary | ICD-10-CM

## 2017-06-15 NOTE — Progress Notes (Signed)
[name] at [place] NAME: [name]    MR#:  [id number]  DATE OF BIRTH:  [birth year]  SUBJECTIVE:   Patient came in after mechanical fall at home. Denies any chest pain.  pt out in the chair. On room air. Breathing better. No complaints. REVIEW OF SYSTEMS:   Review of Systems  Constitutional: Negative for chills, fever and weight loss.  HENT: Negative for ear discharge, ear pain and nosebleeds.   Eyes: Negative for blurred vision, pain and discharge.  Respiratory: Negative for shortness of breath, wheezing and stridor.   Cardiovascular: Negative for chest pain, palpitations, orthopnea and PND.  Gastrointestinal: Negative for abdominal pain, diarrhea, nausea and vomiting.  Genitourinary: Negative for frequency and urgency.  Musculoskeletal: Positive for back pain. Negative for joint pain.  Neurological: Positive for weakness. Negative for sensory change, speech change and focal weakness.  Psychiatric/Behavioral: Negative for depression and hallucinations. The patient is not nervous/anxious.    Tolerating Diet:yes Tolerating PT: rehab  DRUG ALLERGIES:   Allergies  Allergen Reactions  . Cyclobenzaprine Other (See Comments)    Hallucination. Mental instability.  DO NOT GIVE ANY MUSCLE RELAXANTS  . Baclofen Other (See Comments)    Confusion and weakness  . Nsaids     Avoids due to liver.  . Sulfamethoxazole-Trimethoprim Nausea Only and Other (See Comments)    Patient unaware of this as an allergy  . Tolmetin Other (See Comments)    Avoids due to liver.it is an nsaid    VITALS:  Blood pressure (!) 154/73, pulse 69, temperature 98 F (36.7 C), temperature source Oral, resp. rate 18, SpO2 93 %.  PHYSICAL EXAMINATION:   Physical Exam  GENERAL:  84 y.o.-year-old patient lying in the bed with no acute distress.  EYES: Pupils equal, round, reactive to light and accommodation. No scleral icterus. Extraocular muscles intact.   HEENT: Head atraumatic, normocephalic. Oropharynx and nasopharynx clear.  NECK:  Supple, no jugular venous distention. No thyroid enlargement, no tenderness.  LUNGS: decreased breath sounds bilaterally left > right, no wheezing, rales, rhonchi. No use of accessory muscles of respiration.  CARDIOVASCULAR: S1, S2 normal. No murmurs, rubs, or gallops.  ABDOMEN: Soft, nontender, nondistended. Bowel sounds present. No organomegaly or mass.  EXTREMITIES: No cyanosis, clubbing or edema b/l.    NEUROLOGIC: Cranial nerves II through XII are intact. No focal Motor or sensory deficits b/l.   PSYCHIATRIC:  patient is alert and oriented x 3.  SKIN: No obvious rash, lesion, or ulcer.   LABORATORY PANEL:  CBC Recent Labs  Lab 06/14/17 0328  WBC 6.1  HGB 11.2*  HCT 33.5*  PLT 131*    Chemistries  Recent Labs  Lab 06/14/17 0328  NA 137  K 4.0  CL 104  CO2 27  GLUCOSE 105*  BUN 16  CREATININE 1.18  CALCIUM 8.7*   Cardiac Enzymes No results for input(s): TROPONINI in the last 168 hours. RADIOLOGY:  [place] Chest (pleural Effusion)  Result Date: 06/14/2017 CLINICAL DATA:  Evaluate left-sided pleural effusion for potential thoracentesis. EXAM: CHEST ULTRASOUND COMPARISON:  Chest radiograph-06/13/2017; 02/19/2016; 12/15/2013; 08/02/2013; PET-CT - 03/15/2017 FINDINGS: Sonographic evaluation performed by the dictating interventional radiologist demonstrates a small to moderate sized left-sided pleural effusion however this appears grossly unchanged compared to remote chest radiograph performed in 2015. IMPRESSION: Unchanged small to moderate-sized anechoic left-sided pleural effusion, similar to remote chest radiograph performed in 2015. Chronicity of the left-sided pleural effusion was discussed with referring physician, Dr. Posey Pronto,  and decision was made NOT to proceed with ultrasound-guided thoracentesis at this time given concern for the high likelihood of development of an ex vacuo pneumothorax and  high likelihood of early effusion recurrence. Electronically Signed   By: Sandi Mariscal M.D.   On: 06/14/2017 17:04   ASSESSMENT AND PLAN:   [name]  is a 84 y.o. male with a known history of BPH, anxiety, Carotid stenosis, [name], dyslipidemia, [name], hyperlipidemia, hypertension, inguinal hernia- lives in [facility] assisted living facility with his wife. For last 2 days have some cough and worsening weakness. He was trying to go to the cafeteria with his wife and felt very weak and tried to sit down on the chair but lost her balance and wife was supporting him and they both fell down.  * generalized weakness with increasing shortness of breath -pt said he had resented this is done many years ago.--- Etiology unclear --No evidence of pneumonia on workup. Patient has clinically no signs symptoms of pneumonia. Will DC antibiotics. Procalcitonin negative. Patient's thoracentesis was canceled since patient had stable chest x-rays since 2015. He is currently is symptomatic. This was discussed with radiology. -Patient son Nicola [name] aware  * hypertension   Continue home medication including Imdur, metoprolol.  * hyperlipidemia   Continue pravastatin.  * chronic back pain   Continue Tylenol and tramadol as needed.  *Weakness with fall. Physical therapy to see patient.  If continues to improve will discharge tomorrow. Case discussed with Care Management/Social Worker. Management plans discussed with the patient, family and they are in agreement.  CODE STATUS: partial  DVT Prophylaxis: lovenox  TOTAL TIME TAKING CARE OF THIS PATIENT: *30* minutes.  >50% time spent on counselling and coordination of care  POSSIBLE D/C IN *1-2 DAYS, DEPENDING ON CLINICAL CONDITION.  Note: This dictation was prepared with Dragon dictation along with smaller phrase technology. Any transcriptional errors that result from this process are unintentional.  [name] M.D on 06/15/2017 at 2:16  PM  Between 7am to 6pm - Pager - [phone]  After 6pm go to [email or web address] - password [email or web address] [facility]  Office  [phone]  CC: Primary care physician; [name], MDPatient ID: [name], male   DOB: 1923/11/01, 84 y.o.   MRN: [id number]

## 2017-06-15 NOTE — Progress Notes (Signed)
Plan is for patient to D/C to [facility] tomorrow 06/16/17 pending medical clearance. [facility] admissions coordinator at [facility] is aware of above.   [employer], LCSW [phone]

## 2017-06-16 ENCOUNTER — Telehealth: Payer: Self-pay | Admitting: Cardiovascular Disease

## 2017-06-16 MED ORDER — ALUM & MAG HYDROXIDE-SIMETH 200-200-20 MG/5ML PO SUSP
30.0000 mL | Freq: Four times a day (QID) | ORAL | Status: DC | PRN
  Administered 2017-06-16 (×2): 30 mL via ORAL
  Filled 2017-06-16 (×2): qty 30

## 2017-06-16 NOTE — Progress Notes (Signed)
Patient was discharged to [facility]. IV removed with cath intact. Report called. [facility] sent the patient packet.  EMS to transport. Family aware and in room.

## 2017-06-16 NOTE — Clinical Social Work Placement (Signed)
   CLINICAL SOCIAL WORK PLACEMENT  NOTE  Date:  06/16/2017  Patient Details  Name: [name] MRN: [id number] Date of Birth: February 04, 1924  Clinical Social Work is seeking post-discharge placement for this patient at the [place] level of care (*CSW will initial, date and re-position this form in  chart as items are completed):  Yes   Patient/family provided with [place] Work Department's list of facilities offering this level of care within the geographic area requested by the patient (or if unable, by the patient's family).  Yes   Patient/family informed of their freedom to choose among providers that offer the needed level of care, that participate in Medicare, Medicaid or managed care program needed by the patient, have an available bed and are willing to accept the patient.      Patient/family informed of [facility]'s ownership interest in [facility] and [facility], as well as of the fact that they are under no obligation to receive care at these facilities.  PASRR submitted to EDS on       PASRR number received on       Existing PASRR number confirmed on 06/14/17     FL2 transmitted to all facilities in geographic area requested by pt/family on 06/14/17     FL2 transmitted to all facilities within larger geographic area on       Patient informed that his/her managed care company has contracts with or will negotiate with certain facilities, including the following:        Yes   Patient/family informed of bed offers received.  Patient chooses bed at [facility] )     Physician recommends and patient chooses bed at      Patient to be transferred to [facility] ) on 06/16/17.  Patient to be transferred to facility by [facility] )     Patient family notified on 06/16/17 of transfer.  Name of family member notified:  (Patient's son Marya Amsler and wife Posey Pronto are aware of D/C today. )     PHYSICIAN        Additional Comment:    _______________________________________________ Vaden Becherer, Veronia Beets, LCSW 06/16/2017, 11:07 AM

## 2017-06-16 NOTE — Telephone Encounter (Signed)
Error

## 2017-06-16 NOTE — Progress Notes (Signed)
Patient is medically stable for D/C to [facility] today. Per [facility] admissions coordinator at [facility] patient can come today to room 405. Patient has met the 3 night inpatient stay criteria under medicare, he was inpatient from 06/13/17-06/16/17. RN will call report and arrange EMS for transport. Clinical Education officer, museum (CSW) sent D/C orders to [facility] via [place]. Patient is aware of above. CSW made patient's son Marya Amsler and wife Posey Pronto aware of above. Please reconsult if future social work needs arise. CSW signing off.   [employer], LCSW [phone]

## 2017-06-16 NOTE — Care Management Important Message (Signed)
Important Message  Patient Details  Name: [name] MRN: [id number] Date of Birth: 10/17/1923   Medicare Important Message Given:  Yes    Juliann Pulse A Kandra Graven 06/16/2017, 9:46 AM

## 2017-06-16 NOTE — Discharge Summary (Addendum)
[facility] [place] NAME: [name]    MR#:  [id number]  DATE OF BIRTH:  27-Jan-1924  DATE OF ADMISSION:  06/13/2017 ADMITTING PHYSICIAN: Vaughan Basta, MD  DATE OF DISCHARGE: 06/16/2017 PRIMARY CARE PHYSICIAN: Tracie Harrier, MD    ADMISSION DIAGNOSIS:  Pleural effusion [J90] Hypoxia [R09.02] Altered mental status, unspecified altered mental status type [R41.82]  DISCHARGE DIAGNOSIS:  Generalized weakness Chronic left pleural effusion  SECONDARY DIAGNOSIS:   Past Medical History:  Diagnosis Date  . Anginal pain ([place])    c/o heaviness a few years ago.  stent placed and all resolved  . Anxiety   . BPH (benign prostatic hypertrophy)   . Carotid stenosis    a. 05/2015 Carotid U/S: mild nonobs atherosclerosis. No need for f/u.  Marland Kitchen Chronic back pain   . Coronary artery disease    a. 06/2013 Cath/PCI: LAD 50-70p (FFR 0.82-->PCI w/ 3.5x24 Promus DES), RCA subtotally occluded w/ L->R collats, LCX 30ost.  . Dyslipidemia   . GERD (gastroesophageal reflux disease)   . Hx of [name]   . Hyperlipidemia   . Hypertension   . Inguinal hernia   . Lightheadedness    a. chronic, somewhat positional.  . 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 Major depression   . Pleural effusion    left  . Sleep apnea    will not use cpap    HOSPITAL COURSE:  ArmpStrumis [name], anxiety, Carotid stenosis, [name], dyslipidemia, [name], hyperlipidemia, hypertension, [name] For last 2 days have some cough and worsening weakness. He was trying to go to the cafeteria with his wife and felt very weak and tried to sit down on the chair but lost her balance and wife was supporting him and they both fell down.  * generalized weakness with increasing shortness of  breath -pt said he had resented this is done many years ago.--- Etiology unclear --No evidence of pneumonia on workup. Patient has clinically no signs symptoms of pneumonia. Will DC antibiotics. Procalcitonin negative.  * hypertension Continue home medication including Imdur, metoprolol.  * hyperlipidemia Continue pravastatin.  * chronic back pain Continue Tylenol and tramadol as needed.  *Weakness with fall.  * Malnutrition moderate -follow dietitian  Pt will d/c to [facility] today. D/w grand daughter in the room.  CONSULTS OBTAINED:  Treatment Team:  Hessie Knows, MD  DRUG ALLERGIES:   Allergies  Allergen Reactions  . Cyclobenzaprine Other (See Comments)    Hallucination. Mental instability.  DO NOT GIVE ANY MUSCLE RELAXANTS  . Baclofen Other (See Comments)    Confusion and weakness  . Nsaids     Avoids due to liver.  . Sulfamethoxazole-Trimethoprim Nausea Only and Other (See Comments)    Patient unaware of this as an allergy  . Tolmetin Other (See Comments)    Avoids due to liver.it is an nsaid    DISCHARGE MEDICATIONS:   Allergies as of 06/16/2017      Reactions   Cyclobenzaprine Other (See Comments)   Hallucination. Mental instability.  DO NOT GIVE ANY MUSCLE RELAXANTS   Baclofen Other (See Comments)   Confusion and weakness   Nsaids    Avoids due to liver.   Sulfamethoxazole-trimethoprim Nausea Only, Other (See Comments)   Patient unaware of this as an allergy  Tolmetin Other (See Comments)   Avoids due to liver.it is an nsaid      Medication List    STOP taking these medications   bacitracin 500 UNIT/GM ointment   fluticasone furoate-vilanterol 100-25 MCG/INH Aepb Commonly known as:  BREO ELLIPTA   Probiotic 250 MG Caps     TAKE these medications   acetaminophen 500 MG tablet Commonly known as:  TYLENOL Take 2 tablets (1,000 mg total) by mouth every 6 (six) hours.   acidophilus Caps capsule Take 1 capsule by mouth daily.    clonazePAM 0.5 MG tablet Commonly known as:  KLONOPIN Take 1 tablet (0.5 mg total) by mouth 3 (three) times daily as needed for anxiety.   docusate sodium 100 MG capsule Commonly known as:  COLACE Take 100 mg by mouth 2 (two) times daily.   doxazosin 1 MG tablet Commonly known as:  CARDURA Take 1 tablet (1 mg total) by mouth daily.   FEROCON capsule Generic drug:  ferrous [id number] C-folic acid Take 1 capsule by mouth daily.   isosorbide mononitrate 30 MG 24 hr tablet Commonly known as:  IMDUR Take 1 tablet (30 mg total) by mouth daily.   metoprolol tartrate 25 MG tablet Commonly known as:  LOPRESSOR Take 12.5 mg by mouth 2 (two) times daily.   nitroGLYCERIN 0.4 MG SL tablet Commonly known as:  NITROSTAT Place 0.4 mg under the tongue every 5 (five) minutes as needed for chest pain.   pantoprazole 40 MG tablet Commonly known as:  PROTONIX Take 1 tablet (40 mg total) by mouth daily.   polyethylene glycol packet Commonly known as:  MIRALAX / GLYCOLAX Take 17 g by mouth daily.   pravastatin 40 MG tablet Commonly known as:  PRAVACHOL Take 1 tablet (40 mg total) by mouth daily.   PRESERVISION/LUTEIN PO Take by mouth 2 (two) times daily.   sertraline 100 MG tablet Commonly known as:  ZOLOFT Take 1.5 tablets (150 mg total) by mouth at bedtime. What changed:  when to take this   traMADol 50 MG tablet Commonly known as:  ULTRAM Take 50 mg by mouth 3 (three) times daily.   Vitamin D3 5000 units Caps Take by mouth.       If you experience worsening of your admission symptoms, develop shortness of breath, life threatening emergency, suicidal or homicidal thoughts you must seek medical attention immediately by calling 911 or calling your MD immediately  if symptoms less severe.  You Must read complete instructions/literature along with all the possible adverse reactions/side effects for all the Medicines you take and that have been prescribed to you. Take  any new Medicines after you have completely understood and accept all the possible adverse reactions/side effects.   Please note  You were cared for by a hospitalist during your hospital stay. If you have any questions about your discharge medications or the care you received while you were in the hospital after you are discharged, you can call the unit and asked to speak with the hospitalist on call if the hospitalist that took care of you is not available. Once you are discharged, your primary care physician will handle any further medical issues. Please note that NO REFILLS for any discharge medications will be authorized once you are discharged, as it is imperative that you return to your primary care physician (or establish a relationship with a primary care physician if you do not have one) for your aftercare needs so that they can reassess your need for  medications and monitor your lab values. Today   SUBJECTIVE   Doing well  VITAL SIGNS:  Blood pressure (!) 159/90, pulse 77, temperature 97.6 F (36.4 C), temperature source Oral, resp. rate 18, SpO2 93 %.  I/O:    Intake/Output Summary (Last 24 hours) at 06/16/2017 0934 Last data filed at 06/15/2017 1413 Gross per 24 hour  Intake 360 ml  Output -  Net 360 ml    PHYSICAL EXAMINATION:  GENERAL:  84 y.o.-year-old patient lying in the bed with no acute distress.  EYES: Pupils equal, round, reactive to light and accommodation. No scleral icterus. Extraocular muscles intact.  HEENT: Head atraumatic, normocephalic. Oropharynx and nasopharynx clear.  NECK:  Supple, no jugular venous distention. No thyroid enlargement, no tenderness.  LUNGS: Normal breath sounds bilaterally, no wheezing, rales,rhonchi or crepitation. No use of accessory muscles of respiration.  CARDIOVASCULAR: S1, S2 normal. No murmurs, rubs, or gallops.  ABDOMEN: Soft, non-tender, non-distended. Bowel sounds present. No organomegaly or mass.  EXTREMITIES: No pedal  edema, cyanosis, or clubbing.  NEUROLOGIC: Cranial nerves II through XII are intact. Muscle strength 5/5 in all extremities. Sensation intact. Gait not checked.  PSYCHIATRIC: The patient is alert and oriented x 3.  SKIN: No obvious rash, lesion, or ulcer.   DATA REVIEW:   CBC  Recent Labs  Lab 06/14/17 0328  WBC 6.1  HGB 11.2*  HCT 33.5*  PLT 131*    Chemistries  Recent Labs  Lab 06/14/17 0328  NA 137  K 4.0  CL 104  CO2 27  GLUCOSE 105*  BUN 16  CREATININE 1.18  CALCIUM 8.7*    Microbiology Results   Recent Results (from the past 240 hour(s))  Culture, blood (routine x 2)     Status: None (Preliminary result)   Collection Time: 06/13/17 11:35 AM  Result Value Ref Range Status   Specimen Description BLOOD LT [facility]  Final   Special Requests   Final    BOTTLES DRAWN AEROBIC AND ANAEROBIC Blood Culture results may not be optimal due to an excessive volume of blood received in culture bottles   Culture   Final    NO GROWTH 3 DAYS Performed at [facility], [address], [place], [place] [address]    Report Status PENDING  Incomplete  Culture, blood (routine x 2)     Status: None (Preliminary result)   Collection Time: 06/13/17 12:09 PM  Result Value Ref Range Status   Specimen Description BLOOD RT [facility]  Final   Special Requests   Final    BOTTLES DRAWN AEROBIC AND ANAEROBIC Blood Culture adequate volume   Culture   Final    NO GROWTH 3 DAYS Performed at [facility], [address], [place], [place] [address]    Report Status PENDING  Incomplete  MRSA PCR Screening     Status: None   Collection Time: 06/13/17  5:39 PM  Result Value Ref Range Status   MRSA by PCR NEGATIVE NEGATIVE Final    Comment:        The GeneXpert MRSA Assay (FDA approved for NASAL specimens only), is one component of a comprehensive MRSA colonization surveillance program. It is not intended to diagnose MRSA infection nor to guide or monitor treatment for MRSA  infections. Performed at [facility], [address] [place], [place] [address]     RADIOLOGY:  [place] Chest (pleural Effusion)  Result Date: 06/14/2017 CLINICAL DATA:  Evaluate left-sided pleural effusion for potential thoracentesis. EXAM: CHEST  ULTRASOUND COMPARISON:  Chest radiograph-06/13/2017; 02/19/2016; 12/15/2013; 08/02/2013; PET-CT - 03/15/2017 FINDINGS: Sonographic evaluation performed by the dictating interventional radiologist demonstrates a small to moderate sized left-sided pleural effusion however this appears grossly unchanged compared to remote chest radiograph performed in 2015. IMPRESSION: Unchanged small to moderate-sized anechoic left-sided pleural effusion, similar to remote chest radiograph performed in 2015. Chronicity of the left-sided pleural effusion was discussed with referring physician, Dr. Posey Pronto, and decision was made NOT to proceed with ultrasound-guided thoracentesis at this time given concern for the high likelihood of development of an ex vacuo pneumothorax and high likelihood of early effusion recurrence. Electronically Signed   By: Sandi Mariscal M.D.   On: 06/14/2017 17:04     Management plans discussed with the patient, family and they are in agreement.  CODE STATUS:     Code Status Orders  (From admission, onward)        Start     Ordered   06/13/17 1732  Limited resuscitation (code)  Continuous    Question Answer Comment  In the event of cardiac or respiratory ARREST: Initiate Code Blue, Call Rapid Response Yes   In the event of cardiac or respiratory ARREST: Perform CPR Yes   In the event of cardiac or respiratory ARREST: Perform Intubation/Mechanical Ventilation No   In the event of cardiac or respiratory ARREST: Use NIPPV/BiPAp only if indicated Yes   In the event of cardiac or respiratory ARREST: Administer ACLS medications if indicated Yes   In the event of cardiac or respiratory ARREST: Perform Defibrillation or Cardioversion if  indicated Yes      06/13/17 1731    Code Status History    Date Active Date Inactive Code Status Order ID Comments User Context   12/30/2016 1523 01/01/2017 2020 DNR [id number]  Loletha Grayer, MD Inpatient   12/07/2016 1758 12/09/2016 1814 Full Code [id number]  Henreitta Leber, MD Inpatient   04/15/2016 1946 04/18/2016 1615 Full Code [id number]  Epifanio Lesches, MD ED      TOTAL TIME TAKING CARE OF THIS PATIENT: *40* minutes.    Fritzi Mandes M.D on 06/16/2017 at 9:34 AM  Between 7am to 6pm - Pager - [phone] After 6pm go to [email or web address] - password [facility]  Office  [phone]  CC: Primary care physician; Tracie Harrier, MD

## 2017-06-18 LAB — CULTURE, BLOOD (ROUTINE X 2)
Culture: NO GROWTH
Culture: NO GROWTH
Special Requests: ADEQUATE

## 2017-06-21 ENCOUNTER — Inpatient Hospital Stay: Payer: Medicare Other | Attending: Internal Medicine

## 2017-06-21 ENCOUNTER — Ambulatory Visit: Payer: Medicare Other | Admitting: Internal Medicine

## 2017-06-28 ENCOUNTER — Inpatient Hospital Stay: Payer: Medicare Other | Admitting: Internal Medicine

## 2017-06-29 ENCOUNTER — Encounter: Payer: Self-pay | Admitting: Nurse Practitioner

## 2017-06-29 ENCOUNTER — Ambulatory Visit: Payer: Medicare Other | Admitting: Nurse Practitioner

## 2017-06-29 NOTE — Progress Notes (Deleted)
-   Office Visit    Patient Name: [name] Date of Encounter: 06/29/2017  Primary Care Provider:  Tracie Harrier, MD Primary Cardiologist:  Kathlyn Sacramento, MD  Chief Complaint    84 year old male with a history of [name], hypertension, hyperlipidemia, chronic back pain, and chronic left pleural effusion, who presents for follow-up.  Past Medical History    Past Medical History:  Diagnosis Date  . Anginal pain ([place])    c/o heaviness a few years ago.  stent placed and all resolved  . Anxiety   . BPH (benign prostatic hypertrophy)   . Carotid stenosis    a. 05/2015 Carotid U/S: mild nonobs atherosclerosis. No need for f/u.  Marland Kitchen Chronic back pain   . Chronic Left Pleural Effusion   . Coronary artery disease    a. 06/2013 Cath/PCI: LAD 50-70p (FFR 0.82-->PCI w/ 3.5x24 Promus DES), RCA subtotally occluded w/ L->R collats, LCX 30ost.  . Dyslipidemia   . GERD (gastroesophageal reflux disease)   . Hx of [name]   . Hyperlipidemia   . Hypertension   . Inguinal hernia   . Lightheadedness    a. chronic, somewhat positional.  . 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 Major depression   . Sleep apnea    will not use cpap   Past Surgical History:  Procedure Laterality Date  . ANTERIOR APPROACH HEMI HIP ARTHROPLASTY Right 04/16/2016   Procedure: ANTERIOR APPROACH HEMI HIP ARTHROPLASTY;  Surgeon: Hessie Knows, MD;  Location: [facility] ORS;  Service: Orthopedics;  Laterality: Right;  . APPENDECTOMY     84 years old  . arm fracture Right 2008   rod in arm  . CARDIAC CATHETERIZATION  2002  . CARDIAC CATHETERIZATION  06/2013   stent placed  . COLONOSCOPY    . EYE SURGERY Bilateral 2008   cataract extraction  . FEMUR FRACTURE SURGERY Left    with rod  . HEMORROIDECTOMY     75 years ago  . HIP FRACTURE SURGERY    . JOINT REPLACEMENT Bilateral 03/2016   right partial knee replacement, bilateral THR  . KYPHOPLASTY N/A  12/30/2016   Procedure: [id number];  Surgeon: Hessie Knows, MD;  Location: [facility] ORS;  Service: Orthopedics;  Laterality: N/A;    Allergies  Allergies  Allergen Reactions  . Cyclobenzaprine Other (See Comments)    Hallucination. Mental instability.  DO NOT GIVE ANY MUSCLE RELAXANTS  . Baclofen Other (See Comments)    Confusion and weakness  . Nsaids     Avoids due to liver.  . Sulfamethoxazole-Trimethoprim Nausea Only and Other (See Comments)    Patient unaware of this as an allergy  . Tolmetin Other (See Comments)    Avoids due to liver.it is an nsaid    History of Present Illness    84 year old male with the above complex past medical history including [name] status post PCI and stenting of the LAD in May 2015.  He has a known subtotal occlusion of the right coronary artery and mild disease in the left circumflex.  Other history includes hypertension, hyperlipidemia, somewhat chronic positional lightheadedness, malignant lymphoblastic lymphoma, GERD, chronic left pleural effusion, and chronic back pain with history of vertebral fracture status post T10 kyphoplasty.  He was last seen in clinic in February, at which time he was doing well from a cardiac standpoint but was still having some lower back pain and left shoulder pain.  He  was recently admitted and late April following weakness and a fall.  This was ultimately felt to be a mechanical fall.  He also reported shortness of breath which was determined to be chronic.  A chest x-ray showed slight worsening of left pleural effusion and there was an initial plan for thoracentesis, though this was canceled after it was determined that pleural effusion was truly chronic and not significantly changed.  Home Medications    Prior to Admission medications   Medication Sig Start Date End Date Taking? Authorizing Provider  acetaminophen (TYLENOL) 500 MG tablet Take 2 tablets (1,000 mg total) by mouth every 6 (six) hours. 04/17/16   Dustin Flock, MD  acidophilus (RISAQUAD) CAPS capsule Take 1 capsule by mouth daily.    [provider]  Cholecalciferol (VITAMIN D3) 5000 units CAPS Take by mouth.    [provider]  clonazePAM (KLONOPIN) 0.5 MG tablet Take 1 tablet (0.5 mg total) by mouth 3 (three) times daily as needed for anxiety. 03/29/17   Roselee Nova, MD  docusate sodium (COLACE) 100 MG capsule Take 100 mg by mouth 2 (two) times daily.    [provider]  doxazosin (CARDURA) 1 MG tablet Take 1 tablet (1 mg total) by mouth daily. 05/28/17   Arnetha Courser, MD  ferrous [id number] C-folic acid (FEROCON) capsule Take 1 capsule by mouth daily.  03/24/17 03/24/18  [provider]  isosorbide mononitrate (IMDUR) 30 MG 24 hr tablet Take 1 tablet (30 mg total) by mouth daily. 02/15/17   Roselee Nova, MD  metoprolol tartrate (LOPRESSOR) 25 MG tablet Take 12.5 mg by mouth 2 (two) times daily.     [provider]  Multiple Vitamins-Minerals (PRESERVISION/LUTEIN PO) Take by mouth 2 (two) times daily.    [provider]  nitroGLYCERIN (NITROSTAT) 0.4 MG SL tablet Place 0.4 mg under the tongue every 5 (five) minutes as needed for chest pain.    [provider]  pantoprazole (PROTONIX) 40 MG tablet Take 1 tablet (40 mg total) by mouth daily. 03/16/17   Roselee Nova, MD  polyethylene glycol [facility]aveling) packet Take 17 g by mouth daily.    [provider]  pravastatin (PRAVACHOL) 40 MG tablet Take 1 tablet (40 mg total) by mouth daily. 02/15/17   Roselee Nova, MD  sertraline (ZOLOFT) 100 MG tablet Take 1.5 tablets (150 mg total) by mouth at bedtime. Patient taking differently: Take 150 mg by mouth daily.  03/29/17   Roselee Nova, MD  traMADol (ULTRAM) 50 MG tablet Take 50 mg by mouth 3 (three) times daily.  07/10/16   [provider]    Review of Systems    ***.  All other systems reviewed and are otherwise negative except as  noted above.  Physical Exam    VS:  There were no vitals taken for this visit. , BMI There is no height or weight on file to calculate BMI. GEN: Well nourished, well developed, in no acute distress. HEENT: normal. Neck: Supple, no JVD, carotid bruits, or masses. Cardiac: RRR, no murmurs, rubs, or gallops. No clubbing, cyanosis, edema.  Radials/DP/PT 2+ and equal bilaterally.  Respiratory:  Respirations regular and unlabored, clear to auscultation bilaterally. GI: Soft, nontender, nondistended, BS + x 4. MS: no deformity or atrophy. Skin: warm and dry, no rash. Neuro:  Strength and sensation are intact. Psych: Normal affect.  Accessory Clinical Findings    ECG - ***  Assessment & Plan    1.  ***   Murray Hodgkins, NP 06/29/2017, 7:56 AM

## 2017-06-30 ENCOUNTER — Encounter: Payer: Self-pay | Admitting: Nurse Practitioner

## 2017-07-02 ENCOUNTER — Encounter: Payer: Self-pay | Admitting: Nurse Practitioner

## 2017-10-19 ENCOUNTER — Encounter: Payer: Self-pay | Admitting: Nurse Practitioner

## 2017-10-19 ENCOUNTER — Ambulatory Visit (INDEPENDENT_AMBULATORY_CARE_PROVIDER_SITE_OTHER): Payer: Medicare Other | Admitting: Nurse Practitioner

## 2017-10-19 VITALS — BP 152/64 | HR 83 | Ht 70.0 in | Wt 171.0 lb

## 2017-10-19 DIAGNOSIS — I10 Essential (primary) hypertension: Secondary | ICD-10-CM | POA: Diagnosis not present

## 2017-10-19 DIAGNOSIS — L03115 Cellulitis of right lower limb: Secondary | ICD-10-CM

## 2017-10-19 DIAGNOSIS — I2510 Atherosclerotic heart disease of native coronary artery without angina pectoris: Secondary | ICD-10-CM

## 2017-10-19 DIAGNOSIS — E782 Mixed hyperlipidemia: Secondary | ICD-10-CM

## 2017-10-19 MED ORDER — CEPHALEXIN 500 MG PO CAPS: 500.0000 mg | ORAL_CAPSULE | Freq: Four times a day (QID) | ORAL | 0 refills | Status: AC

## 2017-10-19 NOTE — Patient Instructions (Signed)
Medication Instructions:  Your physician has recommended you make the following change in your medication:  1- TAKE Keflex 500 mg by mouth every 6 hours for 10 days.   Labwork: none  Testing/Procedures: none  Follow-Up: Your physician recommends that you schedule a follow-up appointment in: [place].    Any Other Special Instructions Will Be Listed Below (If Applicable).  Please check blood pressure daily in the morning for 1 week.    If you need a refill on your cardiac medications before your next appointment, please call your pharmacy.

## 2017-10-19 NOTE — Progress Notes (Signed)
Office Visit    Patient Name: [name] Date of Encounter: 10/19/2017  Primary Care Provider:  Tracie Harrier, MD Primary Cardiologist:  Kathlyn Sacramento, MD  Chief Complaint    84 year old male who presents for follow-up related to his history of [name], hypertension, hyperlipidemia, chronic back pain, and right foot cellulitis.  Past Medical History    Past Medical History:  Diagnosis Date  . Anginal pain ([place])    c/o heaviness a few years ago.  stent placed and all resolved  . Anxiety   . BPH (benign prostatic hypertrophy)   . Carotid stenosis    a. 05/2015 Carotid U/S: mild nonobs atherosclerosis. No need for f/u.  Marland Kitchen Chronic back pain   . Chronic Left Pleural Effusion   . Coronary artery disease    a. 06/2013 Cath/PCI: LAD 50-70p (FFR 0.82-->PCI w/ 3.5x24 Promus DES), RCA subtotally occluded w/ L->R collats, LCX 30ost.  . Dyslipidemia   . GERD (gastroesophageal reflux disease)   . Hx of [name]   . Hyperlipidemia   . Hypertension   . Inguinal hernia   . Lightheadedness    a. chronic, somewhat positional.  . 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 Major depression   . Sleep apnea    will not use cpap   Past Surgical History:  Procedure Laterality Date  . ANTERIOR APPROACH HEMI HIP ARTHROPLASTY Right 04/16/2016   Procedure: ANTERIOR APPROACH HEMI HIP ARTHROPLASTY;  Surgeon: Hessie Knows, MD;  Location: [facility] ORS;  Service: Orthopedics;  Laterality: Right;  . APPENDECTOMY     84 years old  . arm fracture Right 2008   rod in arm  . CARDIAC CATHETERIZATION  2002  . CARDIAC CATHETERIZATION  06/2013   stent placed  . COLONOSCOPY    . EYE SURGERY Bilateral 2008   cataract extraction  . FEMUR FRACTURE SURGERY Left    with rod  . HEMORROIDECTOMY     75 years ago  . HIP FRACTURE SURGERY    . JOINT REPLACEMENT Bilateral 03/2016   right partial knee replacement, bilateral THR  . KYPHOPLASTY N/A  12/30/2016   Procedure: [id number];  Surgeon: Hessie Knows, MD;  Location: [facility] ORS;  Service: Orthopedics;  Laterality: N/A;    Allergies  Allergies  Allergen Reactions  . Cyclobenzaprine Other (See Comments)    Hallucination. Mental instability.  DO NOT GIVE ANY MUSCLE RELAXANTS  . Baclofen Other (See Comments)    Confusion and weakness  . Nsaids     Avoids due to liver.  . Sulfamethoxazole-Trimethoprim Nausea Only and Other (See Comments)    Patient unaware of this as an allergy  . Tolmetin Other (See Comments)    Avoids due to liver.it is an nsaid    History of Present Illness    84 year old male with the above complex past medical history including [name] status post PCI and stenting of the LAD in May 2015.  He has a known subtotal occlusion of the right coronary artery and mild disease in the left circumflex.  Other history includes hypertension, hyperlipidemia, somewhat chronic positional lightheadedness, malignant lymphoplasmacytic lymphoma, GERD, and chronic back pain with a history of vertebral fracture status post T10 kyphoplasty.  I last saw him in clinic in February, at which time he was doing well from a heart standpoint but continued to have back pain and also left shoulder pain.  He was being followed in the  cancer center and also by Ortho.  In April, he became weak and fell.  His wife was trying to hold him up at the time and she fell also, suffering a small fracture to her right hip that did not require surgery.  They were both hospitalized.  He was initially treated for pneumonia but this was not appreciated on chest x-ray and antibiotics were subsequently discontinued.  He was subsequently discharged to rehab and he and his wife are now staying at the [facility].  They are in assisted living and are just getting used to some loss of independence.  They both use walkers to get around.  He remains fairly unsteady on his feet but does okay with a walker.  No recent  falls.  He undergoes physical therapy at least 3 times a week.  Blood pressure was also checked frequently and by his report, these have been normal.  He denies chest pain, dyspnea, PND, orthopnea, dizziness, syncope, or early satiety.  He is chronic right ankle and foot wounds and is seen by wound care nurse.  He does have mild swelling of the right ankle in that setting.  Last week, he scraped the second toe on his right foot and has had increasing redness of the toe and also of the distal foot.  He has not had any fevers or chills.  Home Medications    Prior to Admission medications   Medication Sig Start Date End Date Taking? Authorizing Provider  acetaminophen (TYLENOL) 500 MG tablet Take 2 tablets (1,000 mg total) by mouth every 6 (six) hours. 04/17/16   Dustin Flock, MD  acidophilus (RISAQUAD) CAPS capsule Take 1 capsule by mouth daily.    [provider]  Cholecalciferol (VITAMIN D3) 5000 units CAPS Take by mouth.    [provider]  clonazePAM (KLONOPIN) 0.5 MG tablet Take 1 tablet (0.5 mg total) by mouth 3 (three) times daily as needed for anxiety. 03/29/17   Roselee Nova, MD  docusate sodium (COLACE) 100 MG capsule Take 100 mg by mouth 2 (two) times daily.    [provider]  doxazosin (CARDURA) 1 MG tablet Take 1 tablet (1 mg total) by mouth daily. 05/28/17   Arnetha Courser, MD  ferrous [id number] C-folic acid (FEROCON) capsule Take 1 capsule by mouth daily.  03/24/17 03/24/18  [provider]  isosorbide mononitrate (IMDUR) 30 MG 24 hr tablet Take 1 tablet (30 mg total) by mouth daily. 02/15/17   Roselee Nova, MD  metoprolol tartrate (LOPRESSOR) 25 MG tablet Take 12.5 mg by mouth 2 (two) times daily.     [provider]  Multiple Vitamins-Minerals (PRESERVISION/LUTEIN PO) Take by mouth 2 (two) times daily.    [provider]  nitroGLYCERIN (NITROSTAT) 0.4 MG SL tablet Place 0.4 mg under the tongue every 5 (five)  minutes as needed for chest pain.    [provider]  pantoprazole (PROTONIX) 40 MG tablet Take 1 tablet (40 mg total) by mouth daily. 03/16/17   Roselee Nova, MD  polyethylene glycol [facility] / Floria Raveling) packet Take 17 g by mouth daily.    [provider]  pravastatin (PRAVACHOL) 40 MG tablet Take 1 tablet (40 mg total) by mouth daily. 02/15/17   Roselee Nova, MD  sertraline (ZOLOFT) 100 MG tablet Take 1.5 tablets (150 mg total) by mouth at bedtime. Patient taking differently: Take 150 mg by mouth daily.  03/29/17   Roselee Nova, MD  traMADol (ULTRAM) 50 MG tablet Take 50 mg by mouth 3 (three) times daily.  07/10/16   [provider]    Review of Systems    Unsteady gait but overall doing well.  Tolerating physical therapy well.  He has had significant redness of the right foot without fevers or chills.  No drainage.  He denies chest pain, dyspnea, palpitations, PND, orthopnea, dizziness, syncope, or early satiety..  All other systems reviewed and are otherwise negative except as noted above.  Physical Exam    VS:  BP (!) 152/64 (BP Location: Right Arm, Patient Position: Sitting, Cuff Size: Normal)   Pulse 83   Ht 5\' 10"  (1.778 m)   Wt 171 lb (77.6 kg)   BMI 24.54 kg/m  , BMI Body mass index is 24.54 kg/m. GEN: Well nourished, well developed, in no acute distress. HEENT: normal. Neck: Supple, no JVD, carotid bruits, or masses. Cardiac: RRR, no murmurs, rubs, or gallops. No clubbing, cyanosis.  Trace right ankle edema.  Radials/DP/PT 2+ and equal bilaterally.  Respiratory:  Respirations regular and unlabored, diminished breath sounds at the left base.  Otherwise clear to auscultation. GI: Soft, nontender, nondistended, BS + x 4. MS: no deformity or atrophy. Skin: Warm and dry.  The distal right foot and second toe are erythematous and tender.  No drainage. Neuro:  Strength and sensation are intact. Psych: Normal affect.  Accessory Clinical  Findings    ECG personally reviewed by me today -regular sinus rhythm, 83, first-degree AV block, no acute ST or T changes.  Assessment & Plan    1.  Coronary artery disease: Status post prior stenting to LAD in May 2015 with known subtotal occlusion of a nondominant right coronary artery.  He continues to do well from a cardiac standpoint without any chest pain or dyspnea.  He is tolerating physical therapy well.  He remains on beta-blocker, nitrate, statin, and aspirin therapy.  2.  Essential hypertension: Blood pressure elevated today at 152/64 though both he and his wife note that he typically runs in the 1 teens.  I have requested that he has daily blood pressures over the next week at the assisted living facility to more clearly document this.  At this point, I would be reluctant to titrate medications without more proof, given prior history of orthostatic lightheadedness and propensity towards falls.  3.  Right foot cellulitis: He scraped his toe last week and has had significant redness of the second toe and also the entire distal foot.  No fevers or chills.  I have provided a prescription for Keflex 500 mg 4 times daily for the next 10 days and have recommended that he follow-up with the house physician at [facility] as this personnel serves as his primary care.  4.  Hyperlipidemia: Remains on Pravachol.  5.  Deconditioning: Now in assisted living and receiving physical therapy 3 times a week.  6.  Disposition: Follow-up with Dr. Fletcher Anon in 3 months or sooner if necessary.  Murray Hodgkins, NP 10/19/2017, 11:29 AM

## 2017-11-03 DIAGNOSIS — C4491 Basal cell carcinoma of skin, unspecified: Secondary | ICD-10-CM

## 2017-11-03 HISTORY — DX: Basal cell carcinoma of skin, unspecified: C44.91

## 2017-12-09 ENCOUNTER — Telehealth: Payer: Self-pay | Admitting: Cardiovascular Disease

## 2017-12-09 NOTE — Telephone Encounter (Signed)
Called and spoke with patient's wife. She states patient has been having shortness of breath when walking longer distances such as to the dining hall. They live at [facility]. Patient feels like he needs to rest after walking that distance. When walking to the bathroom, he reports no problem with shortness of breath. She is concerned as this has just started happening over the past two weeks. Patient denies chest pain or lightheadedness or dizziness. Says his BP is stable usually around 120/60. Patient last saw Ignacia Bayley, NP on 10/19/17. Next appointment is in December with Dr Fletcher Anon. Routing to [name] for review and any further recommendations.

## 2017-12-09 NOTE — Telephone Encounter (Signed)
Spoke with patient's wife. She says they do not have a PCP anymore because they are now at [facility] and they're doctors are supposed to take care of them. She would prefer to see one of our doctors here.   Asked Gerald Stabs if ok to add patient on to scheduled tomorrow. He agreed. Patient's wife said they can come in tomorrow at 2pm. Added to schedule.

## 2017-12-09 NOTE — Telephone Encounter (Signed)
Patient wife returning call  °

## 2017-12-09 NOTE — Telephone Encounter (Signed)
No answer. Left message to call back.   

## 2017-12-09 NOTE — Telephone Encounter (Signed)
With increased dyspnea, it would be ideal for him to be evaluated/examined and also have some blood work.  This could be carried out by either [place] or primary care, but he should be seen sooner than December.

## 2017-12-09 NOTE — Telephone Encounter (Signed)
Pt c/o Shortness Of Breath: STAT if SOB developed within the last 24 hours or pt is noticeably SOB on the phone  1. Are you currently SOB (can you hear that pt is SOB on the phone)? No, just when he walks around  2. How long have you been experiencing SOB? Couple weeks  3. Are you SOB when sitting or when up moving around? Walking around  4. Are you currently experiencing any other symptoms? No other symptoms.

## 2017-12-10 ENCOUNTER — Encounter: Payer: Self-pay | Admitting: Nurse Practitioner

## 2017-12-10 ENCOUNTER — Ambulatory Visit (INDEPENDENT_AMBULATORY_CARE_PROVIDER_SITE_OTHER): Payer: Medicare Other | Admitting: Nurse Practitioner

## 2017-12-10 VITALS — BP 120/60 | HR 78 | Ht 70.0 in | Wt 196.2 lb

## 2017-12-10 DIAGNOSIS — I10 Essential (primary) hypertension: Secondary | ICD-10-CM

## 2017-12-10 DIAGNOSIS — I509 Heart failure, unspecified: Secondary | ICD-10-CM | POA: Diagnosis not present

## 2017-12-10 DIAGNOSIS — E785 Hyperlipidemia, unspecified: Secondary | ICD-10-CM

## 2017-12-10 DIAGNOSIS — I2510 Atherosclerotic heart disease of native coronary artery without angina pectoris: Secondary | ICD-10-CM

## 2017-12-10 MED ORDER — FUROSEMIDE 40 MG PO TABS: 40.0000 mg | ORAL_TABLET | Freq: Every day | ORAL | 6 refills | Status: DC

## 2017-12-10 NOTE — Patient Instructions (Addendum)
Medication Instructions:  - Your physician has recommended you make the following change in your medication:   1) START lasix (furosemide) 40 mg- take 1 tablet by mouth once daily  If you need a refill on your cardiac medications before your next appointment, please call your pharmacy.   Lab work: - Your physician recommends that you have lab work today: BMP/ CBC/ BNP  If you have labs (blood work) drawn today and your tests are completely normal, you will receive your results only by: Marland Kitchen MyChart Message (if you have MyChart) OR . A paper copy in the mail If you have any lab test that is abnormal or we need to change your treatment, we will call you to review the results.  Testing/Procedures: - Your physician has requested that you have an echocardiogram (same day of office visit with Thurmond Butts if possible- 10/31). Echocardiography is a painless test that uses sound waves to create images of your heart. It provides your doctor with information about the size and shape of your heart and how well your heart's chambers and valves are working. This procedure takes approximately one hour. There are no restrictions for this procedure.   Follow-Up: At [facility], you and your health needs are our priority.  As part of our continuing mission to provide you with exceptional heart care, we have created designated Provider Care Teams.  These Care Teams include your primary Cardiologist (physician) and Advanced Practice Providers (APPs -  Physician Assistants and Nurse Practitioners) who all work together to provide you with the care you need, when you need it. . 1 week  with Christell Faith, PA- Thursday 12/16/17 at 1:30 pm.  Any Other Special Instructions Will Be Listed Below (If Applicable). - N/A

## 2017-12-10 NOTE — Progress Notes (Signed)
Office Visit    Patient Name: [name] Date of Encounter: 12/10/2017  Primary Care Provider:  Tracie Harrier, MD Primary Cardiologist:  Kathlyn Sacramento, MD  Chief Complaint    39 y /o ? with a h/o [name], HTN, HL, chronic back pain, and R foot cellulitis, who presents for f/u 2/2 dyspnea.  Past Medical History    Past Medical History:  Diagnosis Date  . Anginal pain ([place])    c/o heaviness a few years ago.  stent placed and all resolved  . Anxiety   . BPH (benign prostatic hypertrophy)   . Carotid stenosis    a. 05/2015 Carotid U/S: mild nonobs atherosclerosis. No need for f/u.  Marland Kitchen Chronic back pain   . Chronic Left Pleural Effusion   . Coronary artery disease    a. 06/2013 Cath/PCI: LAD 50-70p (FFR 0.82-->PCI w/ 3.5x24 Promus DES), RCA subtotally occluded w/ L->R collats, LCX 30ost.  . Dyslipidemia   . GERD (gastroesophageal reflux disease)   . Hx of [name]   . Hyperlipidemia   . Hypertension   . Inguinal hernia   . Lightheadedness    a. chronic, somewhat positional.  . 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 Major depression   . Sleep apnea    will not use cpap   Past Surgical History:  Procedure Laterality Date  . ANTERIOR APPROACH HEMI HIP ARTHROPLASTY Right 04/16/2016   Procedure: ANTERIOR APPROACH HEMI HIP ARTHROPLASTY;  Surgeon: Hessie Knows, MD;  Location: [facility] ORS;  Service: Orthopedics;  Laterality: Right;  . APPENDECTOMY     84 years old  . arm fracture Right 2008   rod in arm  . CARDIAC CATHETERIZATION  2002  . CARDIAC CATHETERIZATION  06/2013   stent placed  . COLONOSCOPY    . EYE SURGERY Bilateral 2008   cataract extraction  . FEMUR FRACTURE SURGERY Left    with rod  . HEMORROIDECTOMY     75 years ago  . HIP FRACTURE SURGERY    . JOINT REPLACEMENT Bilateral 03/2016   right partial knee replacement, bilateral THR  . KYPHOPLASTY N/A 12/30/2016   Procedure: [id number];   Surgeon: Hessie Knows, MD;  Location: [facility] ORS;  Service: Orthopedics;  Laterality: N/A;    Allergies  Allergies  Allergen Reactions  . Cyclobenzaprine Other (See Comments)    Hallucination. Mental instability.  DO NOT GIVE ANY MUSCLE RELAXANTS  . Baclofen Other (See Comments)    Confusion and weakness  . Nsaids     Avoids due to liver.  . Sulfamethoxazole-Trimethoprim Nausea Only and Other (See Comments)    Patient unaware of this as an allergy  . Tolmetin Other (See Comments)    Avoids due to liver.it is an nsaid    History of Present Illness    84 y/o ? with the above complex PMH including [name] s/p PCI and DES to the lAD in 06/2013.  He has a known subtotal occlusion fo the RCA and mild dzs in the LCX.  Other hx includes HTN, HL, somewhat chronic positional lightheadedness, malignant lymphoplasmacytic lymphoma, GERD, and chronic back pain with a h/o vertebral fx s/p T10 kyphoplasty.  I last saw him on 9/3, at which time he was doing fairly well, tolerating PT 3x/wk.  His BP was elevated and I asked him to monitor his BP's at home, given h/o orthostasis.  He also was developing R foot cellulitis  and I provided him w/ a Rx for Keflex with recommendation to f/u with his PCP.  He has since recovered from cellulitis.  He and his wife are adjusting to [employer].  They say that the food is very salty there though.  He has completed his work with physical therapy and his gait has been fairly steady.  He continues to use a walker.  Mrs. Mckelvy has noted that Mr. Minshall has had an increase in abd girth and also that he has been more dyspneic with activity over the past two weeks.  He says that he is a little more short of breath than usual after walking to the cafeteria.  He has not had any chest pain and denies pnd, orthopnea, n, v, dizziness, syncope, edema, or early satiety.  He does not weigh himself @ home.  Due to dyspnea, his wife called the office and he was placed on my schedule today.   He says that he was dyspneic when walking back from the waiting room today.   Home Medications    Prior to Admission medications   Medication Sig Start Date End Date Taking? Authorizing Provider  acetaminophen (TYLENOL) 500 MG tablet Take 2 tablets (1,000 mg total) by mouth every 6 (six) hours. 04/17/16   Dustin Flock, MD  acidophilus (RISAQUAD) CAPS capsule Take 1 capsule by mouth daily.    [provider]  Cholecalciferol (VITAMIN D3) 5000 units CAPS Take by mouth.    [provider]  clonazePAM (KLONOPIN) 0.5 MG tablet Take 1 tablet (0.5 mg total) by mouth 3 (three) times daily as needed for anxiety. 03/29/17   Roselee Nova, MD  docusate sodium (COLACE) 100 MG capsule Take 100 mg by mouth 2 (two) times daily.    [provider]  doxazosin (CARDURA) 1 MG tablet Take 1 tablet (1 mg total) by mouth daily. 05/28/17   Arnetha Courser, MD  ferrous [id number] C-folic acid (FEROCON) capsule Take 1 capsule by mouth daily.  03/24/17 03/24/18  [provider]  isosorbide mononitrate (IMDUR) 30 MG 24 hr tablet Take 1 tablet (30 mg total) by mouth daily. 02/15/17   Roselee Nova, MD  metoprolol tartrate (LOPRESSOR) 25 MG tablet Take 12.5 mg by mouth 2 (two) times daily.     [provider]  Multiple Vitamins-Minerals (PRESERVISION/LUTEIN PO) Take by mouth 2 (two) times daily.    [provider]  nitroGLYCERIN (NITROSTAT) 0.4 MG SL tablet Place 0.4 mg under the tongue every 5 (five) minutes as needed for chest pain.    [provider]  pantoprazole (PROTONIX) 40 MG tablet Take 1 tablet (40 mg total) by mouth daily. 03/16/17   Roselee Nova, MD  polyethylene glycol [facility] / Floria Raveling) packet Take 17 g by mouth daily.    [provider]  pravastatin (PRAVACHOL) 40 MG tablet Take 1 tablet (40 mg total) by mouth daily. 02/15/17   Roselee Nova, MD  sertraline (ZOLOFT) 100 MG tablet Take 1.5 tablets (150 mg total) by  mouth at bedtime. Patient taking differently: Take 150 mg by mouth daily.  03/29/17   Roselee Nova, MD  traMADol (ULTRAM) 50 MG tablet Take 50 mg by mouth 3 (three) times daily.  07/10/16   [provider]    Review of Systems    DOE with increase in abd girth over the past 2 wks.  He denies chest pain, palpitations, dyspnea, pnd, orthopnea, n, v, dizziness, syncope,  edema, weight gain, or early satiety.  All other systems reviewed and are otherwise negative except as noted above.  Physical Exam    VS:  Ht 5\' 10"  (1.778 m)   Wt 196 lb 4 oz (89 kg)   SpO2 98%   BMI 28.16 kg/m  , BMI Body mass index is 28.16 kg/m. GEN: Well nourished, well developed, in no acute distress. HEENT: normal. Neck: Supple, JVP ~ 12 cm, no carotid bruits, or masses. Cardiac: RRR, + S4, no murmurs, rubs. No clubbing, cyanosis.  Trace bilat ankle edema.  Radials/DP/PT 1+ and equal bilaterally.  Respiratory:  Respirations regular and unlabored, diminished breath sounds bilaterally w/ basilar crackles. GI: firm, protuberant w/ some venous distension.  Nontender, BS + x 4. MS: no deformity or atrophy. Skin: warm and dry, no rash. Neuro:  Strength and sensation are intact. Psych: Normal affect.  Accessory Clinical Findings    ECG personally reviewed by me today - RSR, 78, 1st deg AVB, no acute ST/T changes. - no acute changes.  Lab Results  Component Value Date   CREATININE 1.18 06/14/2017   BUN 16 06/14/2017   NA 137 06/14/2017   K 4.0 06/14/2017   CL 104 06/14/2017   CO2 27 06/14/2017     Assessment & Plan    1.  Acute CHF:  Pt presents with a two week h/o DOE and increasing abd girth.  His weight is up 25 lbs on our scale in the office, though I suspect that the previous listed wt of 171 lbs is an outlier and likely inaccurate, as he previously trended in the 180-185 lbs range.  That said, he does have evidence of volume overload today with JVD, abd distension, and crackles.  I am going to  obtain a cbc, bmet, and bnp now.  I asked for a CXR however, pt is dependent upon a ride that has to leave before 4pm and so he will not be able to go over to the hospital to have it.  He has not been having fever/chills and thus my suspicion for lung infxn is low.  If however WBC's are elevated, we will be sure to have him have a CXR on Monday.  I am going to arrange for an echocardiogram to re-eval LV fxn, and have provided him w/ a Rx for lasix 40mg  daily.  Dependent upon labs today, he may require KCl supplementation.  K was nl @ 4.0 in April.  Creat 1.18 @ that time.  He will begin weighing himself daily.  His wife attributes his volume excess and wt gain to the very salty and processed foods that they are given @ [employer].  This appears to be something that will continue to be an issue in the future.  He will f/u next Thursday, @ which time we'll plan to repeat bmet.  Hopefully we can get him in for echo prior to then.  2.  [name]:  S/p prior LAD stenting in May 2015 w/ known subtotal occlusion of a nondominant RCA.  No chest pain but has been having DOE.  See above.  Echo pending.  If EF down, will need to have a discussion re: ischemic eval.  3.  Essential HTN:  Stable.  4.  HL:  On pravachol.  LDL 23 in 02/2016.  LFT's ok in 02/2017.  5.  Dispo: f/u cbc, bmet, bnp today.  Echo pending.  F/u next Thursday w/ bmet @ that time.  Murray Hodgkins, NP 12/10/2017, 2:22 PM

## 2017-12-11 LAB — SPECIMEN STATUS REPORT

## 2017-12-13 ENCOUNTER — Telehealth: Payer: Self-pay | Admitting: *Deleted

## 2017-12-13 DIAGNOSIS — Z79899 Other long term (current) drug therapy: Secondary | ICD-10-CM

## 2017-12-13 DIAGNOSIS — R0600 Dyspnea, unspecified: Secondary | ICD-10-CM

## 2017-12-13 NOTE — Telephone Encounter (Signed)
Spoke with wife. She said she could not ask her family at this time to take her husband to get the lab work and chest xray because they were out of town and already having to bring patient to the doctor on Thursday. She agreed that if I could get [employer] transportation to bring patient to the [facility], patient could go.   [place] and spoke with Mikle Bosworth, the care Freight forwarder. She said she would get with transportation to arrange for patient to go to the [facility] today or tomorrow for the lab work and chest xray. She will let the patient and wife know what time it will be.

## 2017-12-13 NOTE — Telephone Encounter (Signed)
-----   Message from Theora Gianotti, NP sent at 12/12/2017  8:25 AM EDT ----- No labs reported.  Unfortunate.  If he can arrange for transportation, I think it's important to get these labs on Monday - CBC CMET (originally ordered as BMET but we should get CMET instead), and BNP.  I had wanted a CXR on Friday but he didn't have time.  Since he'll have to come to hospital for labs, can you also please arrange for CXR - Dx Dyspnea. '

## 2017-12-13 NOTE — Telephone Encounter (Signed)
Called patient's wife back. She verbalized understanding that [employer] will arrange transportation to the [facility] for lab work and chest xray. Lab work re-entered and chest xray entered.

## 2017-12-13 NOTE — Telephone Encounter (Signed)
Noted, await results.

## 2017-12-14 ENCOUNTER — Ambulatory Visit
Admission: RE | Admit: 2017-12-14 | Discharge: 2017-12-14 | Disposition: A | Discharge status: Home / Self Care | Payer: Medicare Other | Source: Ambulatory Visit | Attending: Physician Assistant | Admitting: Physician Assistant

## 2017-12-14 ENCOUNTER — Telehealth: Payer: Self-pay | Admitting: Physician Assistant

## 2017-12-14 ENCOUNTER — Other Ambulatory Visit
Admission: RE | Admit: 2017-12-14 | Discharge: 2017-12-14 | Disposition: A | Discharge status: Home / Self Care | Payer: Medicare Other | Source: Ambulatory Visit | Attending: Physician Assistant | Admitting: Physician Assistant

## 2017-12-14 ENCOUNTER — Ambulatory Visit
Admission: RE | Admit: 2017-12-14 | Discharge: 2017-12-14 | Disposition: A | Discharge status: Home / Self Care | Payer: Medicare Other | Source: Ambulatory Visit | Attending: Nurse Practitioner | Admitting: Nurse Practitioner

## 2017-12-14 DIAGNOSIS — R0600 Dyspnea, unspecified: Secondary | ICD-10-CM

## 2017-12-14 DIAGNOSIS — J90 Pleural effusion, not elsewhere classified: Secondary | ICD-10-CM | POA: Diagnosis not present

## 2017-12-14 LAB — CBC WITH DIFFERENTIAL/PLATELET
Abs Immature Granulocytes: 0.06 10*3/uL (ref 0.00–0.07)
Basophils Absolute: 0 10*3/uL (ref 0.0–0.1)
Basophils Relative: 0 %
Eosinophils Absolute: 0.1 10*3/uL (ref 0.0–0.5)
Eosinophils Relative: 1 %
HCT: 38 % — ABNORMAL LOW (ref 39.0–52.0)
Hemoglobin: 12 g/dL — ABNORMAL LOW (ref 13.0–17.0)
Immature Granulocytes: 1 %
Lymphocytes Relative: 15 %
Lymphs Abs: 1.2 10*3/uL (ref 0.7–4.0)
MCH: 29.4 pg (ref 26.0–34.0)
MCHC: 31.6 g/dL (ref 30.0–36.0)
MCV: 93.1 fL (ref 80.0–100.0)
Monocytes Absolute: 0.6 10*3/uL (ref 0.1–1.0)
Monocytes Relative: 8 %
Neutro Abs: 6 10*3/uL (ref 1.7–7.7)
Neutrophils Relative %: 75 %
Platelets: 179 10*3/uL (ref 150–400)
RBC: 4.08 MIL/uL — ABNORMAL LOW (ref 4.22–5.81)
RDW: 13.9 % (ref 11.5–15.5)
WBC: 7.9 10*3/uL (ref 4.0–10.5)
nRBC: 0 % (ref 0.0–0.2)

## 2017-12-14 LAB — COMPREHENSIVE METABOLIC PANEL
ALT: 15 U/L (ref 0–44)
AST: 25 U/L (ref 15–41)
Albumin: 3.8 g/dL (ref 3.5–5.0)
Alkaline Phosphatase: 102 U/L (ref 38–126)
Anion gap: 11 (ref 5–15)
BUN: 34 mg/dL — ABNORMAL HIGH (ref 8–23)
CO2: 27 mmol/L (ref 22–32)
Calcium: 9.1 mg/dL (ref 8.9–10.3)
Chloride: 102 mmol/L (ref 98–111)
Creatinine, Ser: 1.59 mg/dL — ABNORMAL HIGH (ref 0.61–1.24)
GFR calc Af Amer: 42 mL/min — ABNORMAL LOW (ref >60)
GFR calc non Af Amer: 36 mL/min — ABNORMAL LOW (ref >60)
Glucose, Bld: 110 mg/dL — ABNORMAL HIGH (ref 70–99)
Potassium: 4.6 mmol/L (ref 3.5–5.1)
Sodium: 140 mmol/L (ref 135–145)
Total Bilirubin: 0.5 mg/dL (ref 0.3–1.2)
Total Protein: 9 g/dL — ABNORMAL HIGH (ref 6.5–8.1)

## 2017-12-14 LAB — BRAIN NATRIURETIC PEPTIDE: B Natriuretic Peptide: 38 pg/mL (ref 0.0–100.0)

## 2017-12-14 NOTE — Telephone Encounter (Signed)
Hard to say based on the radiology report if the effusion is "worse" however, based on notes and my discussion with provider that saw patient, his symptoms seemed to be worse. Given this, our recommendation is that he be evaluated for possible therapeutic thoracentesis. Based on his lab findings, continuation of PO Lasix will not help with this and given his worsening renal function he should hold Lasix until seen in follow up.

## 2017-12-14 NOTE — Telephone Encounter (Signed)
Notes recorded by Rise Mu, PA-C on 12/14/2017 at 12:29 PM EDT Chest x-ray showed moderate sized left-sided pleural effusion. He was previously evaluated for this with consideration of thoracentesis though this was deferred in 05/2017. Given the patient's dyspnea, renal function indicating a dry state with an elevated BUN and serum creatinine I recommend he proceed to the ED for further evaluation and possible therapeutic thoracentesis  Notes recorded by Rise Mu, PA-C on 12/14/2017 at 12:28 PM EDT Please see result note on chest x-ray. In short, patient was advised to proceed to the ED for further evaluation of his moderate size left-sided pleural effusion as labs indicate he is on the dry side with an elevated BUN and serum creatinine. I suspect his dyspnea is related to this effusion and he may benefit at least from a therapeutic thoracentesis. BNP is normal. WBC normal indicating infection is less likely though he should be formally evaluated for this in the ED. Hemoglobin remains low though stable

## 2017-12-14 NOTE — Telephone Encounter (Signed)
Discussion with patient's son this evening. Per his account, the patient has reported the dyspnea is improved from when he was seen on 12/10/17. I did discuss with the son my concerns regarding the patient's dyspnea, lab and CXR findings. Our recommendation has been to have the patient evaluated in the ED for a possible therapeutic/palliative thoracentesis. Patient's son indicates that the patient is overall breathing better and would prefer that his father not have to go to the ED unless needed. I have advised him, since they report his dyspnea is improved, we will try to get the patient seen by pulmonology this week for evaluation of palliative thoracentesis. I have advised the son should the patient's breathing worsen, they need to proceed to the ED. Son is in agreement.   1) Please check with scheduling to see if the patient can be seen by pulmonology on 10/31 when he will already be here for his appointment with [place] and for his echo.

## 2017-12-14 NOTE — Telephone Encounter (Signed)
[facility] calling States that lab was frozen and it requires room temp Please call Autumn at [place]

## 2017-12-14 NOTE — Telephone Encounter (Signed)
Attempted to reach [name]. She was not available at this time. Was transferred to [facility] location. Was on hold for several minutes until it switched to answering machine. Left message for them to call back to discuss.

## 2017-12-14 NOTE — Telephone Encounter (Signed)
I called and spoke with the patient's wife regarding his chest x-ray and lab results.  She is aware of Thurmond Butts, PA's recommendations to have the patient report to the ER for further evaluation and treatment of his left pleural effusion. She inquired if he would need to go today. I advised that the sooner he could go and be evaluated, the better it would be for him.  She states she will need to call and speak with her children and they will decide when to take him in for evaluation. She inquired if he would need to come on 12/16/17 for his echo. I advised I would leave this scheduled for now. However, if they proceed to the ER and then decide to admit him, they may want to echo him in the hospital and he would not need this in the office. She is aware that I am uncertain how the course of his care would go exactly once he got to the ER.  She voices understanding and is agreeable with the above.
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
## 2017-12-14 NOTE — Telephone Encounter (Signed)
Ryan, Did you try to call his son? Or can you?

## 2017-12-14 NOTE — Telephone Encounter (Signed)
Patient's son Marya Amsler calling Would like to speak with nurse in regards to information provided to patient's wife today Would like to clarify and discuss details on what to do with patient at this time Please call to discuss as soon as possible at [phone]

## 2017-12-15 NOTE — Telephone Encounter (Signed)
To scheduling to review for possible pulmonary evaluation tomorrow.  If no availability tomorrow, then ASAP for pulmonary evaluation- he has previously seen Dr. Alva Garnet.
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
## 2017-12-16 ENCOUNTER — Other Ambulatory Visit: Payer: Self-pay

## 2017-12-16 ENCOUNTER — Encounter: Payer: Self-pay | Admitting: Internal Medicine

## 2017-12-16 ENCOUNTER — Ambulatory Visit (INDEPENDENT_AMBULATORY_CARE_PROVIDER_SITE_OTHER): Payer: Medicare Other | Admitting: Physician Assistant

## 2017-12-16 ENCOUNTER — Ambulatory Visit (INDEPENDENT_AMBULATORY_CARE_PROVIDER_SITE_OTHER): Payer: Medicare Other | Admitting: Internal Medicine

## 2017-12-16 ENCOUNTER — Encounter: Payer: Self-pay | Admitting: Physician Assistant

## 2017-12-16 ENCOUNTER — Ambulatory Visit (INDEPENDENT_AMBULATORY_CARE_PROVIDER_SITE_OTHER): Payer: Medicare Other

## 2017-12-16 VITALS — BP 110/60 | HR 90 | Ht 70.0 in | Wt 192.5 lb

## 2017-12-16 VITALS — BP 104/62 | HR 98 | Resp 16 | Ht 70.0 in | Wt 193.0 lb

## 2017-12-16 DIAGNOSIS — I5031 Acute diastolic (congestive) heart failure: Secondary | ICD-10-CM

## 2017-12-16 DIAGNOSIS — I509 Heart failure, unspecified: Secondary | ICD-10-CM

## 2017-12-16 DIAGNOSIS — I10 Essential (primary) hypertension: Secondary | ICD-10-CM

## 2017-12-16 DIAGNOSIS — J810 Acute pulmonary edema: Secondary | ICD-10-CM

## 2017-12-16 DIAGNOSIS — I2510 Atherosclerotic heart disease of native coronary artery without angina pectoris: Secondary | ICD-10-CM

## 2017-12-16 DIAGNOSIS — J941 Fibrothorax: Secondary | ICD-10-CM | POA: Diagnosis not present

## 2017-12-16 DIAGNOSIS — E782 Mixed hyperlipidemia: Secondary | ICD-10-CM

## 2017-12-16 DIAGNOSIS — J90 Pleural effusion, not elsewhere classified: Secondary | ICD-10-CM

## 2017-12-16 LAB — ECHOCARDIOGRAM COMPLETE
Height: 70 in
Weight: 3088 oz

## 2017-12-16 LAB — BRAIN NATRIURETIC PEPTIDE

## 2017-12-16 LAB — SPECIMEN STATUS REPORT

## 2017-12-16 LAB — BASIC METABOLIC PANEL

## 2017-12-16 MED ORDER — TORSEMIDE 20 MG PO TABS: 20.0000 mg | ORAL_TABLET | Freq: Every day | ORAL | 0 refills | Status: DC

## 2017-12-16 NOTE — Patient Instructions (Signed)
Take you medications as prescribed by your Cardiologist  Call us when you have any signs of fevers, cough, wheezing.

## 2017-12-16 NOTE — Patient Instructions (Signed)
Medication Instructions:  STOP the Furosemide START the Torsemide 20 mg daily  If you need a refill on your cardiac medications before your next appointment, please call your pharmacy.   Lab work: Your provider would like for you to have the following labs today: BMET  If you have labs (blood work) drawn today and your tests are completely normal, you will receive your results only by: Marland Kitchen MyChart Message (if you have MyChart) OR . A paper copy in the mail If you have any lab test that is abnormal or we need to change your treatment, we will call you to review the results.  Your provider would like for you to return in one week (Monday or Tuesday) to have the following labs drawn: BMET. Please go to the [facility] entrance and check in at the front desk. You do not need an appointment.   Follow-Up: At [facility], you and your health needs are our priority.  As part of our continuing mission to provide you with exceptional heart care, we have created designated Provider Care Teams.  These Care Teams include your primary Cardiologist (physician) and Advanced Practice Providers (APPs -  Physician Assistants and Nurse Practitioners) who all work together to provide you with the care you need, when you need it. You will need a follow up appointment in 2 weeks.  Please call our office 2 months in advance to schedule this appointment.  You may see Kathlyn Sacramento, MD or one of the following Advanced Practice Providers on your designated Care Team:   Murray Hodgkins, NP Christell Faith, PA-C

## 2017-12-16 NOTE — Progress Notes (Signed)
PULMONARY CONSULT NOTE  Requesting MD/Service: Keith Rake Date of initial consultation: 02/22/17 Reason for consultation: Abn CT chest, "wheezing"  PT PROFILE: 84 y.o. 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 Anginal pain ([place])    c/o heaviness a few years ago.  stent placed and all resolved  . Anxiety   . BPH (benign prostatic hypertrophy)   . Carotid stenosis    a. 05/2015 Carotid U/S: mild nonobs atherosclerosis. No need for f/u.  Marland Kitchen Chronic back pain   . Chronic Left Pleural Effusion   . Coronary artery disease    a. 06/2013 Cath/PCI: LAD 50-70p (FFR 0.82-->PCI w/ 3.5x24 Promus DES), RCA subtotally occluded w/ L->R collats, LCX 30ost.  . Dyslipidemia   . GERD (gastroesophageal reflux disease)   . Hx of [name]   . Hyperlipidemia   . Hypertension   . Inguinal hernia   . Lightheadedness    a. chronic, somewhat positional.  . 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 Major depression   . Sleep apnea    will not use cpap    Past Surgical History:  Procedure Laterality Date  . ANTERIOR APPROACH HEMI HIP ARTHROPLASTY Right 04/16/2016   Procedure: ANTERIOR  APPROACH HEMI HIP ARTHROPLASTY;  Surgeon: Hessie Knows, MD;  Location: [facility] ORS;  Service: Orthopedics;  Laterality: Right;  . APPENDECTOMY     84 years old  . arm fracture Right 2008   rod in arm  . CARDIAC CATHETERIZATION  2002  . CARDIAC CATHETERIZATION  06/2013   stent placed  . COLONOSCOPY    . EYE SURGERY Bilateral 2008   cataract extraction  . FEMUR FRACTURE SURGERY Left    with rod  . HEMORROIDECTOMY     75 years ago  . HIP FRACTURE SURGERY    . JOINT REPLACEMENT Bilateral 03/2016   right partial knee replacement, bilateral THR  . KYPHOPLASTY N/A 12/30/2016   Procedure: [id number];  Surgeon: Hessie Knows, MD;  Location: [facility] ORS;  Service: Orthopedics;  Laterality: N/A;    MEDICATIONS: I have reviewed all medications and confirmed regimen as documented Review of Systems:  Gen:  Denies  fever, sweats, chills weigh loss  HEENT: Denies blurred vision, double vision, ear pain, eye pain, hearing loss, nose bleeds, sore throat Cardiac:  No dizziness, chest pain or heaviness, chest tightness,edema, No JVD Resp:   +cough +sputum production, +shortness of breath,+wheezing, -hemoptysis,  Gi: Denies swallowing difficulty, stomach pain, nausea or vomiting, diarrhea, constipation, bowel incontinence Gu:  Denies bladder incontinence, burning urine Ext:   Denies Joint pain, stiffness or swelling Skin: Denies  skin rash,  easy bruising or bleeding or hives Endoc:  Denies polyuria, polydipsia , polyphagia or weight change Psych:   Denies depression, insomnia or hallucinations  Other:  All other systems negative   BP 104/62 (BP Location: Right Arm, Cuff Size: Normal)   Pulse 98   Resp 16   Ht 5\' 10"  (1.778 m)   Wt 193 lb (87.5 kg)   SpO2 94%   BMI 27.69 kg/m    Physical Examination:   GENERAL:NAD, no fevers, chills, no weakness no fatigue HEAD: Normocephalic, atraumatic.  EYES: Pupils equal, round, reactive to light. Extraocular muscles intact. No scleral icterus.   MOUTH: Moist mucosal membrane. Dentition intact. No abscess noted.  EAR, NOSE, THROAT: Clear without exudates. No external lesions.  NECK: Supple. No thyromegaly. No nodules. No JVD.  PULMONARY: CTA B/L no wheezing, rhonchi, +crackles CARDIOVASCULAR: S1 and S2. Regular rate and rhythm. No murmurs, rubs, or gallops. No edema. Pedal pulses 2+ bilaterally.  GASTROINTESTINAL: Soft, nontender, nondistended. No masses. Positive bowel sounds. No hepatosplenomegaly.  MUSCULOSKELETAL: No swelling, clubbing, or edema. Range of motion full in all extremities.  NEUROLOGIC: Cranial nerves II through XII are intact. No gross focal neurological deficits. Sensation intact. Reflexes intact.  SKIN: No ulceration, lesions, rashes, or cyanosis. Skin warm and dry. Turgor intact.  PSYCHIATRIC: Mood, affect within normal limits. The patient is awake, alert and oriented x 3. Insight, judgment intact.  ALL OTHER ROS ARE NEGATIVE     DATA:   BMP Latest Ref Rng & Units 12/14/2017 12/10/2017 06/14/2017  Glucose 70 - 99 mg/dL 110(H) CANCELED 105(H)  BUN 8 - 23 mg/dL 34(H) - 16  Creatinine 0.61 - 1.24 mg/dL 1.59(H) - 1.18  BUN/Creat Ratio 10 - 22 - - -  Sodium 135 - 145 mmol/L 140 - 137  Potassium 3.5 - 5.1 mmol/L 4.6 - 4.0  Chloride 98 - 111 mmol/L 102 - 104  CO2 22 - 32 mmol/L 27 - 27  Calcium 8.9 - 10.3 mg/dL 9.1 - 8.7(L)    CBC Latest Ref Rng & Units 12/14/2017 06/14/2017 06/13/2017  WBC 4.0 - 10.5 K/uL 7.9 6.1 8.8  Hemoglobin 13.0 - 17.0 g/dL 12.0(L) 11.2(L) 11.2(L)  Hematocrit 39.0 - 52.0 % 38.0(L) 33.5(L) 33.7(L)  Platelets 150 - 400 K/uL 179 131(L) 149(L)   CT chest 02/19/16: Loculated left pleural effusion with LLL atelectasis/consolidation.  Pleura on the left is thickened consistent with fibrothorax. CXR 12/31/16: Chronic left pleural effusion and pleural thickening        IMPRESSION:   84 yo frail white male seen today for progressive SOB with DOE in setting of acute pulmonary edema based on  physical exam most likely related to systolic cardiac dysfunction in th setting of underlying and untreated sleep apnea with deconditioned state  I have explained that patient will likely need ONO and 6MWT to assess for exertional and nocturnal hypoxia, however, patient DOES NOT WANT oxygen therapy.  I have also explained to patient that he will need to be treated for underlying OSA, but at this time, patient DOES NOT WANT CPAP therapy.   I have suggested that he call us if he changes his mind or if he is getting worse. He is at high risk for developing acute decompensated CHF and pneumonia.  He does NOT need inhaler therapy, steroids or ABX at this time.  Patient is very happy with visit and content with plan of action  Patient to obtain ECHO and to start new diuretic therapy   Patient/Family are satisfied  with Plan of action and management. All questions answered Follow up in 6 months  Carola Viramontes Patricia Pesa, M.D.  [facility]  Medical Director [place] Director [facility] Cardio-Pulmonary Department

## 2017-12-17 LAB — BASIC METABOLIC PANEL
BUN/Creatinine Ratio: 22 (ref 10–24)
BUN: 35 mg/dL (ref 10–36)
CO2: 22 mmol/L (ref 20–29)
Calcium: 9.6 mg/dL (ref 8.6–10.2)
Chloride: 97 mmol/L (ref 96–106)
Creatinine, Ser: 1.61 mg/dL — ABNORMAL HIGH (ref 0.76–1.27)
GFR calc Af Amer: 42 mL/min/{1.73_m2} — ABNORMAL LOW (ref >59)
GFR calc non Af Amer: 37 mL/min/{1.73_m2} — ABNORMAL LOW (ref >59)
Glucose: 139 mg/dL — ABNORMAL HIGH (ref 65–99)
Potassium: 5.1 mmol/L (ref 3.5–5.2)
Sodium: 137 mmol/L (ref 134–144)

## 2017-12-21 ENCOUNTER — Other Ambulatory Visit
Admission: RE | Admit: 2017-12-21 | Discharge: 2017-12-21 | Disposition: A | Discharge status: Home / Self Care | Payer: Medicare Other | Source: Ambulatory Visit | Attending: Physician Assistant | Admitting: Physician Assistant

## 2017-12-21 ENCOUNTER — Telehealth: Payer: Self-pay | Admitting: *Deleted

## 2017-12-21 DIAGNOSIS — I10 Essential (primary) hypertension: Secondary | ICD-10-CM

## 2017-12-21 DIAGNOSIS — Z79899 Other long term (current) drug therapy: Secondary | ICD-10-CM | POA: Insufficient documentation

## 2017-12-21 DIAGNOSIS — R0600 Dyspnea, unspecified: Secondary | ICD-10-CM

## 2017-12-21 DIAGNOSIS — I2510 Atherosclerotic heart disease of native coronary artery without angina pectoris: Secondary | ICD-10-CM

## 2017-12-21 LAB — COMPREHENSIVE METABOLIC PANEL
ALT: 14 U/L (ref 0–44)
AST: 24 U/L (ref 15–41)
Albumin: 4 g/dL (ref 3.5–5.0)
Alkaline Phosphatase: 103 U/L (ref 38–126)
Anion gap: 9 (ref 5–15)
BUN: 41 mg/dL — ABNORMAL HIGH (ref 8–23)
CO2: 29 mmol/L (ref 22–32)
Calcium: 9.6 mg/dL (ref 8.9–10.3)
Chloride: 101 mmol/L (ref 98–111)
Creatinine, Ser: 1.75 mg/dL — ABNORMAL HIGH (ref 0.61–1.24)
GFR calc Af Amer: 37 mL/min — ABNORMAL LOW (ref >60)
GFR calc non Af Amer: 32 mL/min — ABNORMAL LOW (ref >60)
Glucose, Bld: 99 mg/dL (ref 70–99)
Potassium: 4.4 mmol/L (ref 3.5–5.1)
Sodium: 139 mmol/L (ref 135–145)
Total Bilirubin: 0.4 mg/dL (ref 0.3–1.2)
Total Protein: 8.8 g/dL — ABNORMAL HIGH (ref 6.5–8.1)

## 2017-12-21 LAB — CBC WITH DIFFERENTIAL/PLATELET
Abs Immature Granulocytes: 0.07 10*3/uL (ref 0.00–0.07)
Basophils Absolute: 0 10*3/uL (ref 0.0–0.1)
Basophils Relative: 0 %
Eosinophils Absolute: 0.1 10*3/uL (ref 0.0–0.5)
Eosinophils Relative: 1 %
HCT: 38.3 % — ABNORMAL LOW (ref 39.0–52.0)
Hemoglobin: 12.2 g/dL — ABNORMAL LOW (ref 13.0–17.0)
Immature Granulocytes: 1 %
Lymphocytes Relative: 17 %
Lymphs Abs: 1.5 10*3/uL (ref 0.7–4.0)
MCH: 29.8 pg (ref 26.0–34.0)
MCHC: 31.9 g/dL (ref 30.0–36.0)
MCV: 93.4 fL (ref 80.0–100.0)
Monocytes Absolute: 0.7 10*3/uL (ref 0.1–1.0)
Monocytes Relative: 8 %
Neutro Abs: 6.6 10*3/uL (ref 1.7–7.7)
Neutrophils Relative %: 73 %
Platelets: 188 10*3/uL (ref 150–400)
RBC: 4.1 MIL/uL — ABNORMAL LOW (ref 4.22–5.81)
RDW: 13.6 % (ref 11.5–15.5)
WBC: 9 10*3/uL (ref 4.0–10.5)
nRBC: 0 % (ref 0.0–0.2)

## 2017-12-21 LAB — BRAIN NATRIURETIC PEPTIDE: B Natriuretic Peptide: 32 pg/mL (ref 0.0–100.0)

## 2017-12-21 NOTE — Telephone Encounter (Signed)
No answer. Left detail message with results and recommendations, ok per DPR, and to call back tomorrow to let us know they got the message and to arrange for f/u lab work on Thursday.

## 2017-12-21 NOTE — Telephone Encounter (Signed)
-----   Message from Rise Mu, PA-C sent at 12/21/2017 12:32 PM EST ----- Renal function has trended up further from prior recheck.  Recommend holding torsemide x2 days followed by recheck BMP.  Potassium at goal.  Liver function normal.  Hemoglobin low though at his approximate baseline.  We will plan to resume torsemide probably at a lower dose following renal function trend and next BMP.

## 2017-12-22 NOTE — Telephone Encounter (Signed)
Called daughter, ok per DPR. She verbalized understanding of the results and plan of care. She cannot take patient tomorrow for lab work but can on Friday morning. She verbalized understanding that lab work before noon would be best. She requested I call [facility] to give them the orders as well.   [place] per request of daughter. S/w nurse taking care of patient and she verbalized understanding to hold torsemide today and tomorrow. Then patient will get lab work on Friday morning at the [facility]. This telephone encounter faxed to [facility] at [phone].

## 2017-12-22 NOTE — Telephone Encounter (Signed)
Patient wife returning our call   Please call back

## 2017-12-22 NOTE — Telephone Encounter (Signed)
Patient daughter [name] calling  States she would like to go over instructions with nurse Please call to discuss at [phone]

## 2017-12-24 ENCOUNTER — Other Ambulatory Visit
Admission: RE | Admit: 2017-12-24 | Discharge: 2017-12-24 | Disposition: A | Discharge status: Home / Self Care | Payer: Medicare Other | Source: Ambulatory Visit | Attending: Physician Assistant | Admitting: Physician Assistant

## 2017-12-24 DIAGNOSIS — I10 Essential (primary) hypertension: Secondary | ICD-10-CM | POA: Diagnosis present

## 2017-12-24 DIAGNOSIS — I2510 Atherosclerotic heart disease of native coronary artery without angina pectoris: Secondary | ICD-10-CM | POA: Diagnosis present

## 2017-12-24 LAB — BASIC METABOLIC PANEL
Anion gap: 9 (ref 5–15)
BUN: 35 mg/dL — ABNORMAL HIGH (ref 8–23)
CO2: 28 mmol/L (ref 22–32)
Calcium: 9.7 mg/dL (ref 8.9–10.3)
Chloride: 101 mmol/L (ref 98–111)
Creatinine, Ser: 1.52 mg/dL — ABNORMAL HIGH (ref 0.61–1.24)
GFR calc Af Amer: 44 mL/min — ABNORMAL LOW (ref >60)
GFR calc non Af Amer: 38 mL/min — ABNORMAL LOW (ref >60)
Glucose, Bld: 126 mg/dL — ABNORMAL HIGH (ref 70–99)
Potassium: 4.3 mmol/L (ref 3.5–5.1)
Sodium: 138 mmol/L (ref 135–145)

## 2017-12-24 MED ORDER — TORSEMIDE 20 MG PO TABS: 20.0000 mg | ORAL_TABLET | ORAL | 0 refills | Status: DC | PRN

## 2017-12-24 NOTE — Telephone Encounter (Signed)
Patient's daughter called and made aware of results and instructions.   Notes recorded by Rise Mu, PA-C on 12/24/2017 at 3:09 PM EST Renal function has improved with the holding of torsemide, though is still above his baseline. Potassium at goal.   If dyspnea has improved, recommend taking torsemide 20 mg prn for SOB.    Patient's daughter called and notified that the order to take Torsemide 20 mg prn has been faxed to [facility] and Willette Cluster, Marine scientist, at [facility] has been informed.  Order successfully faxed to [phone].

## 2017-12-24 NOTE — Addendum Note (Signed)
Addended by: Ricci Barker on: 12/24/2017 04:03 PM   Modules accepted: Orders

## 2017-12-24 NOTE — Telephone Encounter (Signed)
Patient daughter Freda Munro came by office States that [facility] has taken patient off of Lasix States that [employer] will need to be notified or an order sent to make them aware to stop Lasix Patient daughter states that he is still getting it in his pill pack but has not been taking Please advise

## 2017-12-30 DIAGNOSIS — C4492 Squamous cell carcinoma of skin, unspecified: Secondary | ICD-10-CM

## 2017-12-30 HISTORY — DX: Squamous cell carcinoma of skin, unspecified: C44.92

## 2018-01-02 ENCOUNTER — Emergency Department
Admission: EM | Admit: 2018-01-02 | Discharge: 2018-01-02 | Disposition: A | Discharge status: Skilled Nursing Facility | Payer: Medicare Other | Attending: Emergency Medicine | Admitting: Emergency Medicine

## 2018-01-02 ENCOUNTER — Emergency Department: Payer: Medicare Other

## 2018-01-02 DIAGNOSIS — I2510 Atherosclerotic heart disease of native coronary artery without angina pectoris: Secondary | ICD-10-CM | POA: Insufficient documentation

## 2018-01-02 DIAGNOSIS — Z96641 Presence of right artificial hip joint: Secondary | ICD-10-CM | POA: Insufficient documentation

## 2018-01-02 DIAGNOSIS — Y999 Unspecified external cause status: Secondary | ICD-10-CM | POA: Insufficient documentation

## 2018-01-02 DIAGNOSIS — Z96653 Presence of artificial knee joint, bilateral: Secondary | ICD-10-CM | POA: Diagnosis not present

## 2018-01-02 DIAGNOSIS — I10 Essential (primary) hypertension: Secondary | ICD-10-CM | POA: Diagnosis not present

## 2018-01-02 DIAGNOSIS — Z8572 Personal history of non-Hodgkin lymphomas: Secondary | ICD-10-CM | POA: Insufficient documentation

## 2018-01-02 DIAGNOSIS — Y92121 Bathroom in nursing home as the place of occurrence of the external cause: Secondary | ICD-10-CM | POA: Diagnosis not present

## 2018-01-02 DIAGNOSIS — W01198A Fall on same level from slipping, tripping and stumbling with subsequent striking against other object, initial encounter: Secondary | ICD-10-CM | POA: Insufficient documentation

## 2018-01-02 DIAGNOSIS — Z87891 Personal history of nicotine dependence: Secondary | ICD-10-CM | POA: Insufficient documentation

## 2018-01-02 DIAGNOSIS — Z79899 Other long term (current) drug therapy: Secondary | ICD-10-CM | POA: Diagnosis not present

## 2018-01-02 DIAGNOSIS — S61205A Unspecified open wound of left ring finger without damage to nail, initial encounter: Secondary | ICD-10-CM | POA: Diagnosis not present

## 2018-01-02 DIAGNOSIS — S0101XA Laceration without foreign body of scalp, initial encounter: Secondary | ICD-10-CM | POA: Diagnosis not present

## 2018-01-02 DIAGNOSIS — Y9301 Activity, walking, marching and hiking: Secondary | ICD-10-CM | POA: Insufficient documentation

## 2018-01-02 DIAGNOSIS — S0990XA Unspecified injury of head, initial encounter: Secondary | ICD-10-CM

## 2018-01-02 DIAGNOSIS — W19XXXA Unspecified fall, initial encounter: Secondary | ICD-10-CM

## 2018-01-02 LAB — URINALYSIS, COMPLETE (UACMP) WITH MICROSCOPIC
Bacteria, UA: NONE SEEN
Bilirubin Urine: NEGATIVE
Glucose, UA: NEGATIVE mg/dL
Hgb urine dipstick: NEGATIVE
Ketones, ur: NEGATIVE mg/dL
Leukocytes, UA: NEGATIVE
Nitrite: NEGATIVE
Protein, ur: NEGATIVE mg/dL
Specific Gravity, Urine: 1.013 (ref 1.005–1.030)
Squamous Epithelial / LPF: NONE SEEN (ref 0–5)
pH: 6 (ref 5.0–8.0)

## 2018-01-02 MED ORDER — LIDOCAINE-EPINEPHRINE 2 %-1:100000 IJ SOLN
INTRAMUSCULAR | Status: AC
  Administered 2018-01-02: 08:00:00
  Filled 2018-01-02: qty 1

## 2018-01-02 NOTE — ED Notes (Signed)
Patient transported to CT 

## 2018-01-02 NOTE — ED Notes (Signed)
Pt face, neck, and head, cleaned by rn. 1cm skin tear to left fourth digit. Skin placed back over area and covered with Band-Aid. Laceration to head, left crown area, around 1.5-2cm. Bandage on pt right neck and nose from previous visit to the dermatologist.

## 2018-01-02 NOTE — ED Triage Notes (Signed)
Patient coming [facility] from [facility] for unwitnessed fall. Patient denies complaints. Patient has laceration to left posterior/lateral head. Patient has abrasion to nose. Per EMS, patient's room smelled strongly of urine.

## 2018-01-02 NOTE — ED Provider Notes (Signed)
[facility] Emergency Department Provider Note  ____________________________________________   First MD Initiated Contact with Patient 01/02/18 [phone]     (approximate)  I have reviewed the triage vital signs and the nursing notes.   HISTORY  Chief Complaint Fall   HPI [name] is a 84 y.o. male with a history of multiple falls who is presenting with a fall this morning.  He states that he was walking to the bathroom with his walker when his walker became caught in the bathroom door.  He says that he fell into the bathroom door causing a laceration on the left parietal region of his head.  He denies losing consciousness.  Patient does not take any blood thinners.  Family corroborates the story and says that he has had multiple falls and that he is acting at his baseline mental status.  Patient states last tetanus shot is within the last 10 years.  Past Medical History:  Diagnosis Date  . Anginal pain ([place])    c/o heaviness a few years ago.  stent placed and all resolved  . Anxiety   . BPH (benign prostatic hypertrophy)   . Carotid stenosis    a. 05/2015 Carotid U/S: mild nonobs atherosclerosis. No need for f/u.  Marland Kitchen Chronic back pain   . Chronic Left Pleural Effusion   . Coronary artery disease    a. 06/2013 Cath/PCI: LAD 50-70p (FFR 0.82-->PCI w/ 3.5x24 Promus DES), RCA subtotally occluded w/ L->R collats, LCX 30ost.  . Dyslipidemia   . GERD (gastroesophageal reflux disease)   . Hx of [name]   . Hyperlipidemia   . Hypertension   . Inguinal hernia   . Lightheadedness    a. chronic, somewhat positional.  . 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 Major depression   . Sleep apnea    will not use cpap    Patient Active Problem List   Diagnosis Date Noted  . Malnutrition of moderate degree 06/15/2017  . Pneumonia 06/13/2017  . 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 S/P kyphoplasty 01/14/2017  . Respiratory failure, post-operative ([place]) 12/31/2016  . Acute respiratory failure ([place]) 12/30/2016  . Altered mental status 12/07/2016  . Acute kidney injury ([place]) 09/14/2016  . Head injury, intracranial, without loss of consciousness or fracture ([place]) 09/07/2016  . Groin pain, left 06/15/2016  . Closed right hip fracture, with routine healing, subsequent encounter 04/15/2016  . Productive cough 03/23/2016  . Medicare annual wellness visit, subsequent 02/19/2016  . Major depression 10/14/2015  . Behavior disturbance 09/12/2015  . Mobility impaired 08/27/2015  . Skin ulcer of back ([place]) 07/01/2015  . Laceration of right hand 07/01/2015  . Traumatic hematoma of lower back 07/01/2015  . Encounters for administrative purpose 04/01/2015  . Skin lesion of face 12/24/2014  . Contusion of right hand 11/28/2014  . Herpes zoster 10/30/2014  . Need for immunization against influenza 10/24/2014  . Compression fracture of L4 lumbar vertebra 09/28/2014  . Rectal or anal pain 08/13/2014  . Anxiety disorder 07/23/2014  . BPH without obstruction/lower urinary tract symptoms 07/23/2014  . Atherosclerosis of coronary artery 07/23/2014  . Chronic LBP 07/23/2014  . Hypercholesteremia 07/23/2014  . Benign hypertension 07/23/2014  . Acid reflux 07/23/2014  . Chronic recurrent major depressive disorder ([place]) 07/23/2014  . GERD (gastroesophageal reflux disease) 07/23/2014  . Coronary artery disease involving native coronary artery  without angina pectoris 07/06/2014  . Essential hypertension 07/06/2014  . Carotid stenosis 07/06/2014  . Hyperlipidemia   . H/O [name] 12/20/2011    Past Surgical History:  Procedure Laterality Date  . ANTERIOR APPROACH HEMI HIP ARTHROPLASTY Right 04/16/2016   Procedure: ANTERIOR APPROACH HEMI HIP ARTHROPLASTY;  Surgeon: Hessie Knows, MD;  Location: [facility] ORS;  Service: Orthopedics;   Laterality: Right;  . APPENDECTOMY     84 years old  . arm fracture Right 2008   rod in arm  . CARDIAC CATHETERIZATION  2002  . CARDIAC CATHETERIZATION  06/2013   stent placed  . COLONOSCOPY    . EYE SURGERY Bilateral 2008   cataract extraction  . FEMUR FRACTURE SURGERY Left    with rod  . HEMORROIDECTOMY     75 years ago  . HIP FRACTURE SURGERY    . JOINT REPLACEMENT Bilateral 03/2016   right partial knee replacement, bilateral THR  . KYPHOPLASTY N/A 12/30/2016   Procedure: [id number];  Surgeon: Hessie Knows, MD;  Location: [facility] ORS;  Service: Orthopedics;  Laterality: N/A;    Prior to Admission medications   Medication Sig Start Date End Date Taking? Authorizing Provider  acetaminophen (TYLENOL) 500 MG tablet Take 2 tablets (1,000 mg total) by mouth every 6 (six) hours. 04/17/16   Dustin Flock, MD  acidophilus (RISAQUAD) CAPS capsule Take 1 capsule by mouth daily.    [provider]  Cholecalciferol (VITAMIN D3) 5000 units CAPS Take by mouth.    [provider]  clonazePAM (KLONOPIN) 0.5 MG tablet Take 1 tablet (0.5 mg total) by mouth 3 (three) times daily as needed for anxiety. 03/29/17   Roselee Nova, MD  docusate sodium (COLACE) 100 MG capsule Take 100 mg by mouth 2 (two) times daily.    [provider]  doxazosin (CARDURA) 1 MG tablet Take 1 tablet (1 mg total) by mouth daily. 05/28/17   Arnetha Courser, MD  isosorbide mononitrate (IMDUR) 30 MG 24 hr tablet Take 1 tablet (30 mg total) by mouth daily. 02/15/17   Roselee Nova, MD  metoprolol tartrate (LOPRESSOR) 25 MG tablet Take 12.5 mg by mouth 2 (two) times daily.     [provider]  Multiple Vitamins-Minerals (PRESERVISION/LUTEIN PO) Take by mouth 2 (two) times daily.    [provider]  nitroGLYCERIN (NITROSTAT) 0.4 MG SL tablet Place 0.4 mg under the tongue every 5 (five) minutes as needed for chest pain.    [provider]  pantoprazole (PROTONIX) 40 MG  tablet Take 1 tablet (40 mg total) by mouth daily. 03/16/17   Roselee Nova, MD  polyethylene glycol [facility] Floria Raveling) packet Take 17 g by mouth daily.    [provider]  pravastatin (PRAVACHOL) 40 MG tablet Take 1 tablet (40 mg total) by mouth daily. 02/15/17   Roselee Nova, MD  sertraline (ZOLOFT) 100 MG tablet Take 1.5 tablets (150 mg total) by mouth at bedtime. Patient taking differently: Take 150 mg by mouth daily.  03/29/17   Roselee Nova, MD  torsemide (DEMADEX) 20 MG tablet Take 1 tablet (20 mg total) by mouth as needed. 12/24/17 03/24/18  Rise Mu, PA-C  traMADol (ULTRAM) 50 MG tablet Take 50 mg by mouth 3 (three) times daily.  07/10/16   [provider]    Allergies Cyclobenzaprine; Baclofen; Nsaids; Sulfamethoxazole-trimethoprim; and Tolmetin  Family History  Problem Relation Age of Onset  . Heart disease Mother   .  Stroke Mother   . Diabetes Father   . Dementia Father   . Stroke Sister   . Stroke Brother   . Stroke Sister   . Stroke Sister   . Stroke Brother   . Stroke Brother     Social History Social History   Tobacco Use  . Smoking status: Former Smoker    Packs/day: 2.00    Years: 7.00    Pack years: 14.00    Types: Cigarettes    Last attempt to quit: 09/17/1953    Years since quitting: 64.3  . Smokeless tobacco: Never Used  Substance Use Topics  . Alcohol use: No    Alcohol/week: 0.0 standard drinks  . Drug use: No    Review of Systems  Constitutional: No fever/chills Eyes: No visual changes. [name]: No sore throat. Cardiovascular: Denies chest pain. Respiratory: Denies shortness of breath. Gastrointestinal: No abdominal pain.  No nausea, no vomiting.  No diarrhea.  No constipation. Genitourinary: Negative for dysuria. Musculoskeletal: Negative for back pain. Skin: Negative for rash. Neurological: Negative for headaches, focal weakness or numbness.   ____________________________________________   PHYSICAL  EXAM:  VITAL SIGNS: ED Triage Vitals [01/02/18 0701]  Enc Vitals Group     BP (!) 150/76     Pulse Rate 67     Resp 17     Temp 98.7 F (37.1 C)     Temp Source Oral     SpO2 99 %     Weight 194 lb 0.1 oz (88 kg)     Height      Head Circumference      Peak Flow      Pain Score 0     Pain Loc      Pain Edu?      Excl. in [place]?     Constitutional: Alert and oriented. Well appearing and in no acute distress. Eyes: Conjunctivae are normal.  Head: 4 cm laceration to left parietal region with a small surrounding hematoma probably approximately 3 cm at its widest dimension.  Laceration is approximate 3 to 4 mm deep without galea visualized.  There is no active bleeding at this time. Nose: No congestion/rhinnorhea. Mouth/Throat: Mucous membranes are moist.  Neck: No stridor.  Patient without tenderness to palpation to midline cervical spine.  Ranges head neck freely. Cardiovascular: Normal rate, regular rhythm. Grossly normal heart sounds.  Good peripheral circulation. Respiratory: Normal respiratory effort.  No retractions. Lungs CTAB. Gastrointestinal: Soft and nontender. No distention. No CVA tenderness. Musculoskeletal: No lower extremity tenderness nor edema.  No joint effusions.  Bilateral fingers without any bony tenderness to palpation.  Ranges bilateral hands with 5 out of 5 strength without any focal weakness.  No tenderness to the chest wall.  No tenderness palpation along the thoracic or lumbar spines.  5 out of 5 strength bilateral lower extremities without any hip tenderness palpation.  No limb shortening. Neurologic:  Normal speech and language. No gross focal neurologic deficits are appreciated. Skin:  Skin is warm, dry and intact. No rash noted. Psychiatric: Mood and affect are normal. Speech and behavior are normal.  ____________________________________________   LABS (all labs ordered are listed, but only abnormal results are displayed)  Labs Reviewed  URINALYSIS,  COMPLETE (UACMP) WITH MICROSCOPIC - Abnormal; Notable for the following components:      Result Value   Color, Urine YELLOW (*)    APPearance CLEAR (*)    All other components within normal limits   ____________________________________________  EKG  ____________________________________________  RADIOLOGY  No acute intracranial findings to the CT head.  No acute finding to the cervical spine ____________________________________________   PROCEDURES  Procedure(s) performed:    Marland KitchenMarland KitchenLaceration Repair Date/Time: 01/02/2018 9:14 AM Performed by: Orbie Pyo, MD Authorized by: Orbie Pyo, MD   Consent:    Consent obtained:  Verbal   Consent given by:  Patient   Risks discussed:  Infection, pain, retained foreign body, poor cosmetic result and poor wound healing Anesthesia (see MAR for exact dosages):    Anesthesia method:  Local infiltration   Local anesthetic:  Lidocaine 1% WITH epi Laceration details:    Length (cm):  4   Depth (mm):  4 Repair type:    Repair type:  Simple Exploration:    Hemostasis achieved with:  Direct pressure   Wound exploration: entire depth of wound probed and visualized     Contaminated: no   Treatment:    Area cleansed with:  Saline   Amount of cleaning:  Extensive   Irrigation solution:  Sterile saline   Visualized foreign bodies/material removed: no   Skin repair:    Repair method:  Staples   Number of staples:  3 Approximation:    Approximation:  Close Post-procedure details:    Dressing:  Sterile dressing   Patient tolerance of procedure:  Tolerated well, no immediate complications    Critical Care performed:   ____________________________________________   INITIAL IMPRESSION / ASSESSMENT AND PLAN / ED COURSE  Pertinent labs & imaging results that were available during my care of the patient were reviewed by me and considered in my medical decision making (see chart for details).  DDX: UTI,  mechanical fall, intrarenal hemorrhage, contusion, laceration, fracture of the finger As part of my medical decision making, I reviewed the following data within the [name] Notes from outpatient visits  ----------------------------------------- 9:15 AM on 01/02/2018 -----------------------------------------  Patient at this time continues to be at his baseline mental status.  Tolerated the procedure well.  Patient to be discharged at this time.  Request for prescription for a rolling walker as the family believes that his walker is too tall for him.  Patient and family up-to-date regarding the imaging as well as lab results.  Likely mechanical fall. ____________________________________________   FINAL CLINICAL IMPRESSION(S) / ED DIAGNOSES  Fall.  Head laceration.  NEW MEDICATIONS STARTED DURING THIS VISIT:  New Prescriptions   No medications on file     Note:  This document was prepared using Dragon voice recognition software and may include unintentional dictation errors.     Orbie Pyo, MD 01/02/18 [phone]

## 2018-01-07 ENCOUNTER — Ambulatory Visit (INDEPENDENT_AMBULATORY_CARE_PROVIDER_SITE_OTHER): Payer: Medicare Other | Admitting: Nurse Practitioner

## 2018-01-07 ENCOUNTER — Encounter: Payer: Self-pay | Admitting: Nurse Practitioner

## 2018-01-07 VITALS — BP 90/50 | HR 73 | Ht 70.0 in | Wt 194.5 lb

## 2018-01-07 DIAGNOSIS — I2510 Atherosclerotic heart disease of native coronary artery without angina pectoris: Secondary | ICD-10-CM

## 2018-01-07 DIAGNOSIS — I10 Essential (primary) hypertension: Secondary | ICD-10-CM

## 2018-01-07 DIAGNOSIS — I5031 Acute diastolic (congestive) heart failure: Secondary | ICD-10-CM

## 2018-01-07 DIAGNOSIS — R2681 Unsteadiness on feet: Secondary | ICD-10-CM | POA: Diagnosis not present

## 2018-01-07 DIAGNOSIS — N179 Acute kidney failure, unspecified: Secondary | ICD-10-CM

## 2018-01-07 DIAGNOSIS — I5032 Chronic diastolic (congestive) heart failure: Secondary | ICD-10-CM

## 2018-01-07 DIAGNOSIS — J90 Pleural effusion, not elsewhere classified: Secondary | ICD-10-CM

## 2018-01-07 NOTE — Progress Notes (Signed)
Office Visit    Patient Name: [name] Date of Encounter: 01/07/2018  Primary Care Provider:  System, Pcp Not In Primary Cardiologist:  Kathlyn Sacramento, MD  Chief Complaint    84 year old male with a history of [name], hypertension, hyperlipidemia, chronic back pain, right foot cellulitis, and recent issues with dyspnea and HFpEF, who presents for follow-up related to dyspnea.  Past Medical History    Past Medical History:  Diagnosis Date  . (HFpEF) heart failure with preserved ejection fraction ([place])    a. 11/2017 Echo: EF 60-65%, no rwma, GR1 DD. Nl RV fxn.  . Anginal pain ([place])    c/o heaviness a few years ago.  stent placed and all resolved  . Anxiety   . BPH (benign prostatic hypertrophy)   . Carotid stenosis    a. 05/2015 Carotid U/S: mild nonobs atherosclerosis. No need for f/u.  Marland Kitchen Chronic back pain   . Chronic Left Pleural Effusion    a. 11/2017 Mod by CXR.  Marland Kitchen Coronary artery disease    a. 06/2013 Cath/PCI: LAD 50-70p (FFR 0.82-->PCI w/ 3.5x24 Promus DES), RCA subtotally occluded w/ L->R collats, LCX 30ost.  . Dyslipidemia   . GERD (gastroesophageal reflux disease)   . Hx of [name]   . Hyperlipidemia   . Hypertension   . Inguinal hernia   . Lightheadedness    a. chronic, somewhat positional.  . 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 Major depression   . Sleep apnea    will not use cpap   Past Surgical History:  Procedure Laterality Date  . ANTERIOR APPROACH HEMI HIP ARTHROPLASTY Right 04/16/2016   Procedure: ANTERIOR APPROACH HEMI HIP ARTHROPLASTY;  Surgeon: Hessie Knows, MD;  Location: [facility] ORS;  Service: Orthopedics;  Laterality: Right;  . APPENDECTOMY     84 years old  . arm fracture Right 2008   rod in arm  . CARDIAC CATHETERIZATION  2002  . CARDIAC CATHETERIZATION  06/2013   stent placed  . COLONOSCOPY    . EYE SURGERY Bilateral 2008   cataract extraction  . FEMUR FRACTURE  SURGERY Left    with rod  . HEMORROIDECTOMY     75 years ago  . HIP FRACTURE SURGERY    . JOINT REPLACEMENT Bilateral 03/2016   right partial knee replacement, bilateral THR  . KYPHOPLASTY N/A 12/30/2016   Procedure: [id number];  Surgeon: Hessie Knows, MD;  Location: [facility] ORS;  Service: Orthopedics;  Laterality: N/A;    Allergies  Allergies  Allergen Reactions  . Cyclobenzaprine Other (See Comments)    Hallucination. Mental instability.  DO NOT GIVE ANY MUSCLE RELAXANTS  . Baclofen Other (See Comments)    Confusion and weakness  . Nsaids     Avoids due to liver.  . Sulfamethoxazole-Trimethoprim Nausea Only and Other (See Comments)    Patient unaware of this as an allergy  . Tolmetin Other (See Comments)    Avoids due to liver.it is an nsaid    History of Present Illness    84 year old male with above complex past medical history including [name] status post PCI and drug-eluting stent placement to the LAD in May 2015.  He has a known subtotal occlusion of the RCA and mild disease in the left circumflex.  Other history includes hypertension, hyperlipidemia, somewhat chronic positional lightheadedness, malignant lymphoplasmacytic lymphoma, GERD, and chronic back pain with a history of vertebral fracture status post  T10 kyphoplasty.    He has been seen multiple times since late October in the setting of increasing dyspnea, weight gain and increasing abdominal girth.  On October 25, he was volume overloaded and placed on low-dose Lasix.  He was not able to obtain labs that day but the following Monday, labs showed mild renal insufficiency with a relatively normal BNP.  Chest x-ray showed a moderate sized left pleural effusion.  His Lasix dose was initially reduced and he followed up with pulmonology who recommended conservative management for relatively stable effusion.  An echocardiogram was performed and showed normal LV function with grade 1 diastolic dysfunction.  Creatinine rose to  1.61 and he was switched to low-dose torsemide.  Creatinine rose further 1.75 and torsemide was held and he was advised to take it as needed only.  He was seen in the ER November 8 following a mechanical fall.  At that time, creatinine was 1.52.  Since then, he notes improvement in dyspnea.  His weight is down 2 pounds.  He is no longer taking torsemide.  He says he is now able to walk to the cafeteria using his rolling walker without symptoms or limitations.  He denies chest pain, palpitations, PND, orthopnea, dizziness, syncope, edema, or early satiety.  He feels as though his abdominal girth has improved some.  Home Medications    Prior to Admission medications   Medication Sig Start Date End Date Taking? Authorizing Provider  acetaminophen (TYLENOL) 500 MG tablet Take 2 tablets (1,000 mg total) by mouth every 6 (six) hours. 04/17/16  Yes Dustin Flock, MD  acidophilus (RISAQUAD) CAPS capsule Take 1 capsule by mouth daily.   Yes [provider]  Cholecalciferol (VITAMIN D3) 5000 units CAPS Take by mouth.   Yes [provider]  clonazePAM (KLONOPIN) 0.5 MG tablet Take 1 tablet (0.5 mg total) by mouth 3 (three) times daily as needed for anxiety. 03/29/17  Yes Roselee Nova, MD  docusate sodium (COLACE) 100 MG capsule Take 100 mg by mouth 2 (two) times daily.   Yes [provider]  doxazosin (CARDURA) 1 MG tablet Take 1 tablet (1 mg total) by mouth daily. 05/28/17  Yes Lada, Satira Anis, MD  gabapentin (NEURONTIN) 100 MG capsule Take 100 mg by mouth 3 (three) times daily.   Yes [provider]  guaiFENesin (ROBITUSSIN) 100 MG/5ML liquid Take 200 mg by mouth 3 (three) times daily as needed for cough.   Yes [provider]  IRON PO Take by mouth daily.   Yes [provider]  isosorbide mononitrate (IMDUR) 30 MG 24 hr tablet Take 1 tablet (30 mg total) by mouth daily. 02/15/17  Yes Roselee Nova, MD  loperamide (IMODIUM) 2 MG capsule Take 2 mg by  mouth as needed for diarrhea or loose stools.   Yes [provider]  Lutein 10 MG TABS Take 10 mg by mouth 2 (two) times daily.   Yes [provider]  metoprolol tartrate (LOPRESSOR) 25 MG tablet Take 12.5 mg by mouth 2 (two) times daily.    Yes [provider]  Multiple Vitamins-Minerals (PRESERVISION/LUTEIN PO) Take by mouth 2 (two) times daily.   Yes [provider]  mupirocin ointment (BACTROBAN) 2 % Place 1 application into the nose 2 (two) times daily.   Yes [provider]  nitroGLYCERIN (NITROSTAT) 0.4 MG SL tablet Place 0.4 mg under the tongue every 5 (five) minutes as needed for chest pain.   Yes [provider]  pantoprazole (PROTONIX) 40 MG tablet Take 1 tablet (40 mg total) by mouth daily. 03/16/17  Yes Roselee Nova, MD  polyethylene glycol [facility] / GLYCOLAX) packet Take 17 g by mouth daily.   Yes [provider]  pravastatin (PRAVACHOL) 40 MG tablet Take 1 tablet (40 mg total) by mouth daily. 02/15/17  Yes Roselee Nova, MD  senna (SENOKOT) 8.6 MG TABS tablet Take 1 tablet by mouth daily.   Yes [provider]  sertraline (ZOLOFT) 100 MG tablet Take 1.5 tablets (150 mg total) by mouth at bedtime. Patient taking differently: Take 150 mg by mouth daily.  03/29/17  Yes Roselee Nova, MD  traMADol (ULTRAM) 50 MG tablet Take 50 mg by mouth 3 (three) times daily.  07/10/16  Yes [provider]  furosemide (LASIX) 40 MG tablet Take 40 mg by mouth daily.    [provider]  torsemide (DEMADEX) 20 MG tablet Take 1 tablet (20 mg total) by mouth as needed. Patient not taking: Reported on 01/07/2018 12/24/17 03/24/18  Rise Mu, PA-C    Review of Systems    Improvement in dyspnea on exertion.  He denies chest pain, palpitations, PND, orthopnea, dizziness, syncope, edema, or early satiety.  All other systems reviewed and are otherwise negative except as noted above.  Physical Exam    VS:  BP  (!) 90/50 (BP Location: Right Arm, Patient Position: Sitting, Cuff Size: Normal)   Pulse 73   Ht 5\' 10"  (1.778 m)   Wt 194 lb 8 oz (88.2 kg)   BMI 27.91 kg/m  , BMI Body mass index is 27.91 kg/m. GEN: Well nourished, well developed, in no acute distress. HEENT: normal. Neck: Supple, no JVD, carotid bruits, or masses. Cardiac: RRR, no murmurs, rubs, or gallops. No clubbing, cyanosis, edema.  Radials/DP/PT 1+ and equal bilaterally.  Respiratory:  Respirations regular and unlabored, diminished breath sounds at the left base with right basilar crackles. GI: Protuberant, somewhat softer than previously noted.  Nontender, BS + x 4. MS: no deformity or atrophy. Skin: warm and dry, no rash. Neuro:  Strength and sensation are intact. Psych: Normal affect.  Accessory Clinical Findings    ECG personally reviewed by me today -regular sinus rhythm, first-degree AV block- no acute changes.  Assessment & Plan    1.  HFpEF: Since his last visit, weight is down 2 pounds and he notes improvement in dyspnea.  He is no longer on torsemide.  I would question if in the setting of persistent left pleural effusion, a small amount of volume overload simply tipped him over and caused him to feel more dyspneic.  Echocardiogram showed normal LV function with grade 1 diastolic dysfunction.  Heart rate and blood pressure have been stable at home.  Blood pressure softer today but was in the 1 teens just yesterday.  I will follow-up a basic metabolic panel today given acute kidney injury in the setting of diuretics over the past 2 weeks.  Continue to use torsemide only as needed for weight gain.  2.  Persistent left pleural effusion: Seen by pulmonology.  Conservative management.  3.  Essential hypertension: Stable.  4.  Hyperlipidemia: Continue Pravachol.  5.  AKI:  In the setting of diuresis.  Off torsemide.  Recent creat trending down from 11/8.  Will f/u today.  6.  Recent fall with unsteady gait: Seen in the  emergency department earlier this month.  Patient's wife is asked if we could  arrange for physical therapy to help with his gait.  We have sent a referral to their preferred agency.  7.  Disposition: Follow-up basic metabolic panel today.  Follow-up in clinic in approximately 6 to 8 weeks.   Murray Hodgkins, NP 01/07/2018, 12:49 PM

## 2018-01-07 NOTE — Patient Instructions (Signed)
Medication Instructions:  - Your physician recommends that you continue on your current medications as directed. Please refer to the Current Medication list given to you today.  If you need a refill on your cardiac medications before your next appointment, please call your pharmacy.   Lab work: - Your physician recommends that you have lab work today: BMP  If you have labs (blood work) drawn today and your tests are completely normal, you will receive your results only by: Marland Kitchen MyChart Message (if you have MyChart) OR . A paper copy in the mail If you have any lab test that is abnormal or we need to change your treatment, we will call you to review the results.  Testing/Procedures: - none ordered  Follow-Up: At [facility], you and your health needs are our priority.  As part of our continuing mission to provide you with exceptional heart care, we have created designated Provider Care Teams.  These Care Teams include your primary Cardiologist (physician) and Advanced Practice Providers (APPs -  Physician Assistants and Nurse Practitioners) who all work together to provide you with the care you need, when you need it. . 6 weeks with Dr. Fletcher Anon.  Any Other Special Instructions Will Be Listed Below (If Applicable). - N/A

## 2018-01-08 LAB — BASIC METABOLIC PANEL
BUN/Creatinine Ratio: 22 (ref 10–24)
BUN: 36 mg/dL (ref 10–36)
CO2: 22 mmol/L (ref 20–29)
Calcium: 9.4 mg/dL (ref 8.6–10.2)
Chloride: 101 mmol/L (ref 96–106)
Creatinine, Ser: 1.61 mg/dL — ABNORMAL HIGH (ref 0.76–1.27)
GFR calc Af Amer: 42 mL/min/{1.73_m2} — ABNORMAL LOW (ref >59)
GFR calc non Af Amer: 37 mL/min/{1.73_m2} — ABNORMAL LOW (ref >59)
Glucose: 100 mg/dL — ABNORMAL HIGH (ref 65–99)
Potassium: 5 mmol/L (ref 3.5–5.2)
Sodium: 140 mmol/L (ref 134–144)

## 2018-01-18 ENCOUNTER — Ambulatory Visit: Payer: Medicare Other | Admitting: Cardiovascular Disease

## 2018-02-25 ENCOUNTER — Ambulatory Visit: Payer: Medicare Other | Admitting: Cardiovascular Disease

## 2018-03-01 NOTE — Progress Notes (Deleted)
Cardiology Office Note Date:  03/01/2018  Patient ID:  [name], DOB Mar 25, 1923, MRN [id number] PCP:  System, Pcp Not In  Cardiologist:  Dr. Fletcher Anon, MD  ***refresh   Chief Complaint: Follow up  History of Present Illness: [name] is a 85 y.o. male with history of [name] as detailed below, left-sided pleural effusion that dates back to at least 2015, carotid artery stenosis, hypertension, hyperlipidemia, chronic positional lightheadedness/dizziness, sleep apnea intolerant of CPAP, lymphoblastic lymphoma, [name], GERD, chronic back pain with history of vertebral fracture status post T10 kyphoplasty, and right foot cellulitis who presents for follow-up of ***.   Patient underwent cardiac cath in 06/2013 with PCI/DES to the LAD.  At that time it was noted to have subtotal occlusion of the RCA with mild disease in the LCx.  Patient was [facility] to [facility] on [facility]/2017 with cough, generalized weakness, and dyspnea.  Chest x-ray in the ED showed a slight increase and previously noted left pleural effusion with compressive atelectasis.  Ultrasound of the chest during that admission showed a small to moderate sized left-sided pleural effusion that was similar to remote chest x-ray performed and 2015.  Given the chronicity of the left-sided pleural effusion the decision was made not to proceed with ultrasound-guided thoracentesis.  Patient was seen in our office on 10/19/17 at which time he was doing fairly well and tolerating PT 3 times per week.  At that time, his BP was elevated and he was asked to monitor his BPs at home given his history of orthostasis.  Was also noted he had a right foot cellulitis and was treated with Keflex with resolution of symptoms.  It was noted that he and his wife were adjusting to life at [facility] however they did note the food was "very salty" there.  He was seen in the office on 12/10/2017 noting an increase in abdominal girth as well as increased dyspnea with activity  over the prior 2 weeks preceding that visit.  He indicated he was a little more short of breath than usual after walking to the cafeteria.  He reported he did not weigh himself at home.  His weight at that visit on 10/25 was noted to be 196.  Weight on 9/3 in our office was 171 pounds.  He was felt to be volume overloaded with elevated JVD, abdominal distention, and crackles on exam.  Labs were recommended including CBC, BMET, and BNP as well as chest x-ray.  Unfortunately, the patient was dependent on ride from [employer] and we were informed that the ride could not wait for these to be done so the patient had to leave prior to completing his work-up at that time.  It was recommended he start Lasix 40 mg daily.  We were able to contact the patient and have these labs and chest x-ray performed on 12/14/17 which showed a BUN of 34, serum creatinine 1.59 with prior values from 05/2017 showing a BUN/SCr of 16/1.18.  WBC 7.9, HGB 12.0, PLT count 179.  BNP 38.  Chest x-ray showed a moderate sized left pleural effusion.  He was seen by pulmonology who recommended conservative management for his relatively stable pleural effusion.  Echocardiogram was performed and showed normal LV systolic function with grade 1 diastolic dysfunction.  His serum creatinine rose to 1.61 and he was transitioned to low-dose torsemide with further increase in serum creatinine to 1.75 leading to his torsemide being transitioned to PRN.  He was seen  in the ED on 01/02/2018 for a mechanical fall with serum creatinine noted to be improved at 1.52.  He was last seen in the office on 01/07/2018 with improvement in his dyspnea and a weight that was relatively stable at 194 pounds.  He was not taking torsemide.  BP was noted to be soft at 90/50. He was referred to PT. Follow up BMP showed a stable SCr of 1.61 (prior range of 1.5-1.7) and a potassium of 5.0.  ***  Past Medical History:  Diagnosis Date  . (HFpEF) heart  failure with preserved ejection fraction ([place])    a. 11/2017 Echo: EF 60-65%, no rwma, GR1 DD. Nl RV fxn.  . Anginal pain ([place])    c/o heaviness a few years ago.  stent placed and all resolved  . Anxiety   . BPH (benign prostatic hypertrophy)   . Carotid stenosis    a. 05/2015 Carotid U/S: mild nonobs atherosclerosis. No need for f/u.  Marland Kitchen Chronic back pain   . Chronic Left Pleural Effusion    a. 11/2017 Mod by CXR.  Marland Kitchen Coronary artery disease    a. 06/2013 Cath/PCI: LAD 50-70p (FFR 0.82-->PCI w/ 3.5x24 Promus DES), RCA subtotally occluded w/ L->R collats, LCX 30ost.  . Dyslipidemia   . GERD (gastroesophageal reflux disease)   . Hx of [name]   . Hyperlipidemia   . Hypertension   . Inguinal hernia   . Lightheadedness    a. chronic, somewhat positional.  . 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 Major depression   . Sleep apnea    will not use cpap    Past Surgical History:  Procedure Laterality Date  . ANTERIOR APPROACH HEMI HIP ARTHROPLASTY Right 04/16/2016   Procedure: ANTERIOR APPROACH HEMI HIP ARTHROPLASTY;  Surgeon: Hessie Knows, MD;  Location: [facility] ORS;  Service: Orthopedics;  Laterality: Right;  . APPENDECTOMY     85 years old  . arm fracture Right 2008   rod in arm  . CARDIAC CATHETERIZATION  2002  . CARDIAC CATHETERIZATION  06/2013   stent placed  . COLONOSCOPY    . EYE SURGERY Bilateral 2008   cataract extraction  . FEMUR FRACTURE SURGERY Left    with rod  . HEMORROIDECTOMY     75 years ago  . HIP FRACTURE SURGERY    . JOINT REPLACEMENT Bilateral 03/2016   right partial knee replacement, bilateral THR  . KYPHOPLASTY N/A 12/30/2016   Procedure: [id number];  Surgeon: Hessie Knows, MD;  Location: [facility] ORS;  Service: Orthopedics;  Laterality: N/A;    No outpatient medications have been marked as taking for the 03/03/18 encounter (Appointment) with Rise Mu, PA-C.    Allergies:    Cyclobenzaprine; Baclofen; Nsaids; Sulfamethoxazole-trimethoprim; and Tolmetin   Social History:  The patient  reports that he quit smoking about 64 years ago. His smoking use included cigarettes. He has a 14.00 pack-year smoking history. He has never used smokeless tobacco. He reports that he does not drink alcohol or use drugs.   Family History:  The patient's family history includes Dementia in his father; Diabetes in his father; Heart disease in his mother; Stroke in his brother, brother, brother, mother, sister, sister, and sister.  ROS:   ROS   PHYSICAL EXAM: *** VS:  There were no vitals taken for this visit. BMI: There is no height or weight on file to calculate BMI.  Physical Exam  EKG:  Was ordered and interpreted by me today. Shows ***  Recent Labs: 12/21/2017: ALT 14; B Natriuretic Peptide 32.0; Hemoglobin 12.2; Platelets 188 01/07/2018: BUN 36; Creatinine, Ser 1.61; Potassium 5.0; Sodium 140  No results found for requested labs within last 8760 hours.   CrCl cannot be calculated (Patient's most recent lab result is older than the maximum 21 days allowed.).   Wt Readings from Last 3 Encounters:  01/07/18 194 lb 8 oz (88.2 kg)  01/02/18 194 lb 0.1 oz (88 kg)  12/16/17 193 lb (87.5 kg)     Other studies reviewed: Additional studies/records reviewed today include: summarized above  ASSESSMENT AND PLAN:  1. ***  Disposition: F/u with Dr. Fletcher Anon or an APP in ***  Current medicines are reviewed at length with the patient today.  The patient did not have any concerns regarding medicines.  Signed, Christell Faith, PA-C 03/01/2018 7:39 AM     [facility] [address] [place] [address] [place], [place] [address] [phone]

## 2018-03-03 ENCOUNTER — Ambulatory Visit: Payer: Medicare Other | Admitting: Physician Assistant

## 2018-03-18 ENCOUNTER — Telehealth: Payer: Self-pay | Admitting: *Deleted

## 2018-03-18 MED ORDER — AZITHROMYCIN 250 MG PO TABS: ORAL_TABLET | ORAL | 0 refills | Status: AC

## 2018-03-18 MED ORDER — PREDNISONE 20 MG PO TABS: 20.0000 mg | ORAL_TABLET | Freq: Every day | ORAL | 0 refills | Status: DC

## 2018-03-18 NOTE — Telephone Encounter (Signed)
pts spouse seen by Dr. Mortimer Fries for cough, congestion. She asked if something could be sent in for her husband. He is sending in abx and prednisone for patient.

## 2018-05-25 ENCOUNTER — Telehealth: Payer: Self-pay | Admitting: *Deleted

## 2018-05-25 NOTE — Telephone Encounter (Signed)
Was on the phone with [name]'s wife when she wanted to schedule appointment for her husband. [name] was due to come in for f/u in January but had to cancel due to sickness.  [name] and wife live at [facility]  [name] is fair. He get easily fatgiued when walking to meals and such. Physical therapy is working with him. She is concerned about his weight.  States they don't weigh him regularly. He is currently around 201lb. BP 108/68. He is not short of breath or having chest pain. They are unable to leave the facility at this time in light of Covid19. No smartphone. They do have telephone and they agreed to telephone virtual visit on Monday 4/13 with Christell Faith.       Virtual Visit Pre-Appointment Phone Call  Steps For Call:  1. Confirm consent - "In the setting of the current Covid19 crisis, you are scheduled for a (phone or video) visit with your provider on (date) at (time).  Just as we do with many in-office visits, in order for you to participate in this visit, we must obtain consent.  If you'd like, I can send this to your mychart (if signed up) or email for you to review.  Otherwise, I can obtain your verbal consent now.  All virtual visits are billed to your insurance company just like a normal visit would be.  By agreeing to a virtual visit, we'd like you to understand that the technology does not allow for your provider to perform an examination, and thus may limit your provider's ability to fully assess your condition.  Finally, though the technology is pretty good, we cannot assure that it will always work on either your or our end, and in the setting of a video visit, we may have to convert it to a phone-only visit.  In either situation, we cannot ensure that we have a secure connection.  Are you willing to proceed?"  2. Give [name] instructions for WebEx download to smartphone as below if video visit  3. Advise [name] to be prepared with any vital sign or heart rhythm  information, their current medicines, and a piece of paper and pen handy for any instructions they may receive the day of their visit  4. Inform [name] they will receive a phone call 15 minutes prior to their appointment time (may be from unknown caller ID) so they should be prepared to answer  5. Confirm that appointment type is correct in Epic appointment notes (video vs telephone)    TELEPHONE CALL NOTE  [place] has been deemed a candidate for a follow-up tele-health visit to limit community exposure during the Covid-19 pandemic. I spoke with the [name] via phone to ensure availability of phone/video source, confirm preferred email & phone number, and discuss instructions and expectations.  I reminded Essa L Priore to be prepared with any vital sign and/or heart rhythm information that could potentially be obtained via home monitoring, at the time of his visit. I reminded Charels L Sahlin to expect a phone call at the time of his visit if his visit.  Did the [name] verbally acknowledge consent to treatment? yes  Roney Jaffe, RN 05/25/2018 2:05 PM   DOWNLOADING THE [place], go to [employer] and type in WebEx in the search bar. [place] [employer], the blue/green circle. The app is free but as with any other app downloads, their phone may require them to verify saved  payment information or Apple password. The [name] does NOT have to create an account.  - If Android, ask [name] to go to [employer] and type in WebEx in the search bar. [place] [employer], the blue/green circle. The app is free but as with any other app downloads, their phone may require them to verify saved payment information or Android password. The [name] does NOT have to create an account.   CONSENT FOR TELE-HEALTH VISIT - PLEASE REVIEW  I hereby voluntarily request, consent and authorize [facility] and its employed or contracted physicians,  physician assistants, nurse practitioners or other licensed health care professionals (the Practitioner), to provide me with telemedicine health care services (the "Services") as deemed necessary by the treating Practitioner. I acknowledge and consent to receive the Services by the Practitioner via telemedicine. I understand that the telemedicine visit will involve communicating with the Practitioner through live audiovisual communication technology and the disclosure of certain medical information by electronic transmission. I acknowledge that I have been given the opportunity to request an in-person assessment or other available alternative prior to the telemedicine visit and am voluntarily participating in the telemedicine visit.  I understand that I have the right to withhold or withdraw my consent to the use of telemedicine in the course of my care at any time, without affecting my right to future care or treatment, and that the Practitioner or I may terminate the telemedicine visit at any time. I understand that I have the right to inspect all information obtained and/or recorded in the course of the telemedicine visit and may receive copies of available information for a reasonable fee.  I understand that some of the potential risks of receiving the Services via telemedicine include:  Marland Kitchen Delay or interruption in medical evaluation due to technological equipment failure or disruption; . Information transmitted may not be sufficient (e.g. poor resolution of images) to allow for appropriate medical decision making by the Practitioner; and/or  . In rare instances, security protocols could fail, causing a breach of personal health information.  Furthermore, I acknowledge that it is my responsibility to provide information about my medical history, conditions and care that is complete and accurate to the best of my ability. I acknowledge that Practitioner's advice, recommendations, and/or decision may be  based on factors not within their control, such as incomplete or inaccurate data provided by me or distortions of diagnostic images or specimens that may result from electronic transmissions. I understand that the practice of medicine is not an exact science and that Practitioner makes no warranties or guarantees regarding treatment outcomes. I acknowledge that I will receive a copy of this consent concurrently upon execution via email to the email address I last provided but may also request a printed copy by calling the office of [place].    I understand that my insurance will be billed for this visit.   I have read or had this consent read to me. . I understand the contents of this consent, which adequately explains the benefits and risks of the Services being provided via telemedicine.  . I have been provided ample opportunity to ask questions regarding this consent and the Services and have had my questions answered to my satisfaction. . I give my informed consent for the services to be provided through the use of telemedicine in my medical care  By participating in this telemedicine visit I agree to the above.

## 2018-05-29 NOTE — Progress Notes (Deleted)
{Choose 1 Note Type (Telehealth Visit or Telephone Visit):[phone]}   Evaluation Performed:  Follow-up visit  Date:  05/29/2018   ID:  [name], DOB April 30, 1923, MRN [id number]  Patient Location: Other:  [place]  Provider Location: Home  PCP:  System, Pcp Not In  Cardiologist:  Kathlyn Sacramento, MD  Electrophysiologist:  None   Chief Complaint:  Telehealth follow up  History of Present Illness:    [name] is a 85 y.o. male who presents via audio/video conferencing for a telehealth visit today.  He has a history of [name] as outlined below, HFpEF, left-sided pleural effusion that dates back to at least 2015, carotid artery stenosis, HTN, HLD, chronic positional lightheadedness/dizziness, sleep apnea intolerant of CPAP, lymphoblastic lymphoma, [name], GERD, and chronic back pain with history of vertebral fracture status post T10 kyphoplasty.  Patient underwent cardiac cath in 06/2013 with PCI/DES to the LAD.  At that time it was noted to have subtotal occlusion of the RCA with mild disease in the LCx.  He was seen multiple times in late 2019 with increasing dyspnea, weight gain, and abdominal girth. In this setting, he was placed on low dose Lasix. He was not able to obtain labs that day, but several days later labs and CXR were obtained with noted mild renal insufficiency and a relatively normal BNP. CXR showed a moderate sized left pleural effusion. His Lasix dose was initially reduced and he followed up with pulmonology who recommended conservative management given his effusion was relatively stable. Echo was obtained and showed normal LV systolic function with CH8NI. His SCr rose to 1.61 and he was changed to low dose torsemide. Following this, his SCr rose further to 1.75 leading to his torsemide to be changed to prn only. He was seen in the ED in 12/2017 for a fall with SCr noted to be 1.52 at that time. He was most recently seen in the office in 12/2017 with noted  improvement in his dyspnea and was no longer taking torsemide. BP was soft in the 77O systolic. Weight stable at 194 pounds. Follow up BMP at that time showed a stable SCr of 1.61.  Patient's wife called on 4/8 to schedule appointment. She noted the patient continued to have easy fatigability with ambulation. He was not SOB and denied any chest pain. Weight was noted to be around 201 pounds.   ***  The patient {does/does not:200015} have symptoms concerning for COVID-19 infection (fever, chills, cough, or new shortness of breath).    Past Medical History:  Diagnosis Date  . (HFpEF) heart failure with preserved ejection fraction ([place])    a. 11/2017 Echo: EF 60-65%, no rwma, GR1 DD. Nl RV fxn.  . Anginal pain ([place])    c/o heaviness a few years ago.  stent placed and all resolved  . Anxiety   . BPH (benign prostatic hypertrophy)   . Carotid stenosis    a. 05/2015 Carotid U/S: mild nonobs atherosclerosis. No need for f/u.  Marland Kitchen Chronic back pain   . Chronic Left Pleural Effusion    a. 11/2017 Mod by CXR.  Marland Kitchen Coronary artery disease    a. 06/2013 Cath/PCI: LAD 50-70p (FFR 0.82-->PCI w/ 3.5x24 Promus DES), RCA subtotally occluded w/ L->R collats, LCX 30ost.  . Dyslipidemia   . GERD (gastroesophageal reflux disease)   . Hx of [name]   . Hyperlipidemia   . Hypertension   . Inguinal hernia   . Lightheadedness    a. chronic, somewhat  positional.  . 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 Major depression   . Sleep apnea    will not use cpap   Past Surgical History:  Procedure Laterality Date  . ANTERIOR APPROACH HEMI HIP ARTHROPLASTY Right 04/16/2016   Procedure: ANTERIOR APPROACH HEMI HIP ARTHROPLASTY;  Surgeon: Hessie Knows, MD;  Location: [facility] ORS;  Service: Orthopedics;  Laterality: Right;  . APPENDECTOMY     85 years old  . arm fracture Right 2008   rod in arm  . CARDIAC CATHETERIZATION  2002  . CARDIAC  CATHETERIZATION  06/2013   stent placed  . COLONOSCOPY    . EYE SURGERY Bilateral 2008   cataract extraction  . FEMUR FRACTURE SURGERY Left    with rod  . HEMORROIDECTOMY     75 years ago  . HIP FRACTURE SURGERY    . JOINT REPLACEMENT Bilateral 03/2016   right partial knee replacement, bilateral THR  . KYPHOPLASTY N/A 12/30/2016   Procedure: [id number];  Surgeon: Hessie Knows, MD;  Location: [facility] ORS;  Service: Orthopedics;  Laterality: N/A;     No outpatient medications have been marked as taking for the 05/30/18 encounter (Appointment) with Rise Mu, PA-C.     Allergies:   Cyclobenzaprine; Baclofen; Nsaids; Sulfamethoxazole-trimethoprim; and Tolmetin   Social History   Tobacco Use  . Smoking status: Former Smoker    Packs/day: 2.00    Years: 7.00    Pack years: 14.00    Types: Cigarettes    Last attempt to quit: 09/17/1953    Years since quitting: 64.7  . Smokeless tobacco: Never Used  Substance Use Topics  . Alcohol use: No    Alcohol/week: 0.0 standard drinks  . Drug use: No     Family Hx: The patient's family history includes Dementia in his father; Diabetes in his father; Heart disease in his mother; Stroke in his brother, brother, brother, mother, sister, sister, and sister.  ROS:   Please see the history of present illness.    *** All other systems reviewed and are negative.   Prior CV studies:   The following studies were reviewed today:  2D Echo 11/2017: Study Conclusions  - Left ventricle: The cavity size was normal. There was mild   concentric hypertrophy. Systolic function was normal. The   estimated ejection fraction was in the range of 60% to 65%. Wall   motion was normal; there were no regional wall motion   abnormalities. Doppler parameters are consistent with abnormal   left ventricular relaxation (grade 1 diastolic dysfunction). Near   cavity obliteration in systole. - Left atrium: The atrium was normal in size. - Right ventricle:  Systolic function was normal. - Pulmonary arteries: Systolic pressure could not be accurately   estimated. __________  Labs/Other Tests and Data Reviewed:    EKG:  {YQI:[id number]}  Recent Labs: 12/21/2017: ALT 14; B Natriuretic Peptide 32.0; Hemoglobin 12.2; Platelets 188 01/07/2018: BUN 36; Creatinine, Ser 1.61; Potassium 5.0; Sodium 140   Recent Lipid Panel Lab Results  Component Value Date/Time   CHOL 68 02/19/2016 10:30 AM   CHOL 76 (L) 07/23/2014 10:06 AM   CHOL 60 12/14/2013 05:05 AM   TRIG 123 02/19/2016 10:30 AM   TRIG 80 12/14/2013 05:05 AM   HDL 20 (L) 02/19/2016 10:30 AM   HDL 24 (L) 07/23/2014 10:06 AM   HDL 24 (L) 12/14/2013 05:05 AM   CHOLHDL 3.4 02/19/2016 10:30  AM   LDLCALC 23 02/19/2016 10:30 AM   LDLCALC 36 07/23/2014 10:06 AM   LDLCALC 20 12/14/2013 05:05 AM    Wt Readings from Last 3 Encounters:  01/07/18 194 lb 8 oz (88.2 kg)  01/02/18 194 lb 0.1 oz (88 kg)  12/16/17 193 lb (87.5 kg)     Objective:    Vital Signs:  There were no vitals taken for this visit.   Well nourished, well developed male in no acute distress. ***  ASSESSMENT & PLAN:    1. ***  COVID-19 Education: The signs and symptoms of COVID-19 were discussed with the patient and how to seek care for testing (follow up with PCP or arrange E-visit).  ***The importance of social distancing was discussed today.  Time:   Today, I have spent *** minutes with the patient with telehealth technology discussing the above problems.     Medication Adjustments/Labs and Tests Ordered: Current medicines are reviewed at length with the patient today.  Concerns regarding medicines are outlined above.  Tests Ordered: No orders of the defined types were placed in this encounter.  Medication Changes: No orders of the defined types were placed in this encounter.   Disposition:  Follow up {follow up:15908}  Signed, Christell Faith, PA-C  05/29/2018 10:39 AM    [facility]

## 2018-05-30 ENCOUNTER — Telehealth: Payer: Medicare Other | Admitting: Physician Assistant

## 2018-05-30 ENCOUNTER — Other Ambulatory Visit: Payer: Self-pay

## 2018-06-09 ENCOUNTER — Encounter: Payer: Self-pay | Admitting: Cardiovascular Disease

## 2018-06-14 ENCOUNTER — Telehealth: Payer: Self-pay

## 2018-06-14 NOTE — Telephone Encounter (Signed)
Call from wife in regards to SOB from husband.  Weight gain;  4/21 185 lbs 4/23 205 lbs.  She reports that pt does not take lasix and he is not weighed regularly.   He was due to have visit with Christell Faith, PA, but was cancelled d/t CV19.  We made appt for 5/7.   Call routed to provider to see if there is anything we should do in the meantime.

## 2018-06-14 NOTE — Telephone Encounter (Signed)
   TELEPHONE CALL NOTE  This patient has been deemed a candidate for follow-up tele-health visit to limit community exposure during the Covid-19 pandemic. I spoke with the patient wife via phone to discuss instructions. Consent confirmed verbally with wife.  Verlon Au, RN 06/14/2018 3:30 PM

## 2018-06-23 ENCOUNTER — Telehealth (INDEPENDENT_AMBULATORY_CARE_PROVIDER_SITE_OTHER): Payer: Medicare Other | Admitting: Nurse Practitioner

## 2018-06-23 ENCOUNTER — Encounter: Payer: Self-pay | Admitting: Nurse Practitioner

## 2018-06-23 ENCOUNTER — Other Ambulatory Visit: Payer: Self-pay

## 2018-06-23 ENCOUNTER — Telehealth: Payer: Self-pay

## 2018-06-23 VITALS — BP 134/62 | Ht 70.0 in | Wt 200.3 lb

## 2018-06-23 DIAGNOSIS — E785 Hyperlipidemia, unspecified: Secondary | ICD-10-CM

## 2018-06-23 DIAGNOSIS — I2510 Atherosclerotic heart disease of native coronary artery without angina pectoris: Secondary | ICD-10-CM

## 2018-06-23 DIAGNOSIS — N183 Chronic kidney disease, stage 3 unspecified: Secondary | ICD-10-CM

## 2018-06-23 DIAGNOSIS — I5033 Acute on chronic diastolic (congestive) heart failure: Secondary | ICD-10-CM

## 2018-06-23 DIAGNOSIS — I10 Essential (primary) hypertension: Secondary | ICD-10-CM

## 2018-06-23 MED ORDER — TORSEMIDE 20 MG PO TABS: 20.0000 mg | ORAL_TABLET | ORAL | 3 refills | Status: DC

## 2018-06-23 NOTE — Telephone Encounter (Signed)
Duplicate, made in error

## 2018-06-23 NOTE — Telephone Encounter (Signed)
staff message to fax information from office.

## 2018-06-23 NOTE — Telephone Encounter (Signed)
.  Attempted to call patient. LMTCB 06/23/2018

## 2018-06-23 NOTE — Progress Notes (Signed)
Virtual Visit via Telephone Note   This visit type was conducted due to national recommendations for restrictions regarding the COVID-19 Pandemic (e.g. social distancing) in an effort to limit this patient's exposure and mitigate transmission in our community.  Due to his co-morbid illnesses, this patient is at least at moderate risk for complications without adequate follow up.  This format is felt to be most appropriate for this patient at this time.  The patient did not have access to video technology/had technical difficulties with video requiring transitioning to audio format only (telephone).  All issues noted in this document were discussed and addressed.  No physical exam could be performed with this format.  Please refer to the patient's chart for his  consent to telehealth for [facility]. Evaluation Performed:  Follow-up visit  This visit type was conducted due to national recommendations for restrictions regarding the COVID-19 Pandemic (e.g. social distancing).  This format is felt to be most appropriate for this patient at this time.  All issues noted in this document were discussed and addressed.  No physical exam was performed (except for noted visual exam findings with Video Visits).  Please refer to the patient's chart (MyChart message for video visits and phone note for telephone visits) for the patient's consent to telehealth for [facility]. _____________   Date:  06/23/2018   Patient ID:  [name], DOB 05-19-1923, MRN [id number] Patient Location:  home Provider location:   office  Primary Care Provider:  System, Pcp Not In Primary Cardiologist:  Kathlyn Sacramento, MD  Chief Complaint    85 y/o ? with a h/o [name], HTN, HL, chornic back pain, R foot cellulitis, and HFpEF, who presents for f/u related to [place].  Past Medical History    Past Medical History:  Diagnosis Date   (HFpEF) heart failure with preserved ejection fraction ([place])    a. 11/2017 Echo: EF 60-65%,  no rwma, GR1 DD. Nl RV fxn.   Anginal pain ([place])    c/o heaviness a few years ago.  stent placed and all resolved   Anxiety    BPH (benign prostatic hypertrophy)    Carotid stenosis    a. 05/2015 Carotid U/S: mild nonobs atherosclerosis. No need for f/u.   Chronic back pain    Chronic Left Pleural Effusion    a. 11/2017 Mod by CXR.   Coronary artery disease    a. 06/2013 Cath/PCI: LAD 50-70p (FFR 0.82-->PCI w/ 3.5x24 Promus DES), RCA subtotally occluded w/ L->R collats, LCX 30ost.   Dyslipidemia    GERD (gastroesophageal reflux disease)    Hx of [name]    Hyperlipidemia    Hypertension    Inguinal hernia    Lightheadedness    a. chronic, somewhat positional.   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   turned out to be something else in same family but not a problem   Major depression    Sleep apnea    will not use cpap   Past Surgical History:  Procedure Laterality Date   ANTERIOR APPROACH HEMI HIP ARTHROPLASTY Right 04/16/2016   Procedure: ANTERIOR APPROACH HEMI HIP ARTHROPLASTY;  Surgeon: Hessie Knows, MD;  Location: [facility] ORS;  Service: Orthopedics;  Laterality: Right;   APPENDECTOMY     85 years old   arm fracture Right 2008   rod in arm   [place]  06/2013   stent placed   COLONOSCOPY     EYE SURGERY Bilateral 2008  cataract extraction   FEMUR FRACTURE SURGERY Left    with rod   HEMORROIDECTOMY     77 years ago   [place] Bilateral 03/2016   right partial knee replacement, bilateral THR   KYPHOPLASTY N/A 12/30/2016   Procedure: [id number];  Surgeon: Hessie Knows, MD;  Location: [facility] ORS;  Service: Orthopedics;  Laterality: N/A;    Allergies  Allergies  Allergen Reactions   Cyclobenzaprine Other (See Comments)    Hallucination. Mental instability.  DO NOT GIVE ANY MUSCLE RELAXANTS   Baclofen Other (See Comments)    Confusion and weakness    Nsaids     Avoids due to liver.   Sulfamethoxazole-Trimethoprim Nausea Only and Other (See Comments)    Patient unaware of this as an allergy   Tolmetin Other (See Comments)    Avoids due to liver.it is an nsaid    History of Present Illness    [name] is a 85 y.o. male who presents via audio/video conferencing for a telehealth visit today.  He does not have access to a video source, thus we completed this as a phone visit.  He has the above complex PMH including [name] s/p PCI and DES to the LAD in 06/2013.  He has a known subtotal occlusion of the RCA and mild dzs in the LCX.  Other hx includes HTN, HL, chronic positional lightheadedness, malignant lymphoplasmacytic lymphoma, GERD, and chronic back pain w/ a h/o vertebral fx s/p T10 kyphoplasty.  He was last seen in cardiology clinic in November 2019 after developing dyspnea, weight gain, and increasing abdominal girth earlier in the fall, requiring diuresis.  A chest x-ray at that time showed a moderate size left pleural effusion and he was seen by pulmonology who recommended conservative management as it was felt to be stable.  An echocardiogram showed normal LV function with grade 1 diastolic dysfunction.  Diuretics required adjustment in the setting of mild renal insufficiency and he was switched from Lasix to torsemide.  At his last visit, dyspnea and weight had improved slightly (194 pounds and 8 ounces) and creatinine was also stable at 1.61.  He was advised to continue torsemide on an as-needed basis for weight gain.  His wife called our office in early April to schedule an appointment because of weight gain and dyspnea on exertion noted when walking to the cafeteria.  His weight, which had been trending in the low to mid 190s last fall, has not more recently been in the low 200s.  Though he has a prescription for as needed torsemide, Ms. Bebout indicates that the staff hold all of their medicines and they do not feel as though they have  easy access to the medication and therefore he has not been getting torsemide as needed.  He has not had any edema but has had an increase in abdominal girth.  He denies chest pain, palpitations, PND, orthopnea, dizziness, syncope, or early satiety.  The patient does not have symptoms concerning for COVID-19 infection (fever, chills, cough, or new shortness of breath).   Home Medications    Prior to Admission medications   Medication Sig Start Date End Date Taking? Authorizing Provider  ASA 81mg  Daily      acetaminophen (TYLENOL) 500 MG tablet Take 2 tablets (1,000 mg total) by mouth every 6 (six) hours. 04/17/16   Dustin Flock, MD  acidophilus (RISAQUAD) CAPS capsule Take 1 capsule by mouth daily.    [provider]  Cholecalciferol (VITAMIN D3) 5000 units CAPS Take by mouth.    [provider]  clonazePAM (KLONOPIN) 0.5 MG tablet Take 1 tablet (0.5 mg total) by mouth 3 (three) times daily as needed for anxiety. 03/29/17   Roselee Nova, MD  docusate sodium (COLACE) 100 MG capsule Take 100 mg by mouth 2 (two) times daily.    [provider]  doxazosin (CARDURA) 1 MG tablet Take 1 tablet (1 mg total) by mouth daily. 05/28/17   Arnetha Courser, MD  gabapentin (NEURONTIN) 100 MG capsule Take 100 mg by mouth 3 (three) times daily.    [provider]  guaiFENesin (ROBITUSSIN) 100 MG/5ML liquid Take 200 mg by mouth 3 (three) times daily as needed for cough.    [provider]  IRON PO Take by mouth daily.    [provider]  isosorbide mononitrate (IMDUR) 30 MG 24 hr tablet Take 1 tablet (30 mg total) by mouth daily. 02/15/17   Roselee Nova, MD  loperamide (IMODIUM) 2 MG capsule Take 2 mg by mouth as needed for diarrhea or loose stools.    [provider]  Lutein 10 MG TABS Take 10 mg by mouth 2 (two) times daily.    [provider]  metoprolol tartrate (LOPRESSOR) 25 MG tablet Take 12.5 mg by mouth 2 (two) times daily.      [provider]  Multiple Vitamins-Minerals (PRESERVISION/LUTEIN PO) Take by mouth 2 (two) times daily.    [provider]  mupirocin ointment (BACTROBAN) 2 % Place 1 application into the nose 2 (two) times daily.    [provider]  nitroGLYCERIN (NITROSTAT) 0.4 MG SL tablet Place 0.4 mg under the tongue every 5 (five) minutes as needed for chest pain.    [provider]  pantoprazole (PROTONIX) 40 MG tablet Take 1 tablet (40 mg total) by mouth daily. 03/16/17   Roselee Nova, MD  polyethylene glycol [facility] / Floria Raveling) packet Take 17 g by mouth daily.    [provider]  pravastatin (PRAVACHOL) 40 MG tablet Take 1 tablet (40 mg total) by mouth daily. 02/15/17   Roselee Nova, MD  predniSONE (DELTASONE) 20 MG tablet Take 1 tablet (20 mg total) by mouth daily. 03/18/18 03/18/19  Flora Lipps, MD  senna (SENOKOT) 8.6 MG TABS tablet Take 1 tablet by mouth daily.    [provider]  sertraline (ZOLOFT) 100 MG tablet Take 1.5 tablets (150 mg total) by mouth at bedtime. Patient taking differently: Take 150 mg by mouth daily.  03/29/17   Roselee Nova, MD  torsemide (DEMADEX) 20 MG tablet Take 1 tablet (20 mg total) by mouth as needed. Patient not taking: Reported on 01/07/2018 12/24/17 03/24/18  Rise Mu, PA-C  traMADol (ULTRAM) 50 MG tablet Take 50 mg by mouth 3 (three) times daily.  07/10/16   [provider]    Review of Systems    Dyspnea on exertion and increasing abdominal girth as outlined above.  No chest pain, palpitations, PND, orthopnea, dizziness, syncope, or early satiety.  All other systems reviewed and are otherwise negative except as noted above.  Physical Exam    Vital Signs:  BP 134/62 Comment: 06/09/2018   Ht 5\' 10"  (1.778 m)    Wt 200 lb 5 oz (90.9 kg)    BMI 28.74 kg/m    Exam limited in the setting of a phone evaluation.  He is very hard of hearing but  awake alert and oriented x3.  No acute distress.   Respirations are regular and unlabored.  Accessory Clinical Findings    Lab Results  Component Value Date   CREATININE 1.61 (H) 01/07/2018   BUN 36 01/07/2018   NA 140 01/07/2018   K 5.0 01/07/2018   CL 101 01/07/2018   CO2 22 01/07/2018    Lab Results  Component Value Date   WBC 9.0 12/21/2017   HGB 12.2 (L) 12/21/2017   HCT 38.3 (L) 12/21/2017   MCV 93.4 12/21/2017   PLT 188 12/21/2017    Assessment & Plan    1.  Acute on chronic diastolic congestive heart failure: Patient and wife report that he has been having increasing weight, up to 201 pounds currently with increasing abdominal girth and dyspnea on exertion.  He has not been taking torsemide as they do not have this available in the room and therefore cannot really take it on an as-needed basis.  It does not sound as though they have been asking staff to have it provided and staff also have not been weighing him daily.  We have reached out to [place] to allow for them to have torsemide available in the room so that he may take it on an as-needed basis for weight gain.  I have asked him to take 20 mg daily x3 days and I have also asked for home health to check a basic metabolic panel today and again in 1 week.  I advised that it may be more ideal, given his significant difficulty in hearing over the phone, for him to come into the office for evaluation however transportation from [place] is limited at this time and the [facility] would prefer to try and manage things as much as possible over the phone.  2.  Essential hypertension: Stable.  3.  Hyperlipidemia: Remains on Pravachol with an LDL of 23 in March 2019.  4.  Stage III chronic kidney disease: Creatinine was 1.61 in November, at which time we recommended changing torsemide to as needed.  Follow-up basic metabolic panel today or tomorrow through home health services at [place], if possible.  We will plan to follow-up again in 1 week as I have asked him to  use torsemide 20 mg daily x3 days in the setting of symptoms and weight gain as above.  5.  Persistent left pleural effusion: Previously seen by pulmonology and felt to be stable.  6.  [name]:  No c/p.  Cont  blocker, nitrate, statin, asa.  7.  Disposition: Follow-up lab work today and in 1 week.  Follow-up visit in 2 to 4 weeks pending labs.  COVID-19 Education: The signs and symptoms of COVID-19 were discussed with the patient and how to seek care for testing (follow up with PCP or arrange E-visit).  The importance of social distancing was discussed today.  Patient Risk:   After full review of this patient's history and clinical status, I feel that he is at least moderate risk for cardiac complications at this time, thus necessitating a telehealth visit sooner than our first available in office visit.  Time:   Today, I have spent 30 minutes with the patient with telehealth technology discussing heart failure symptoms and management.    Murray Hodgkins, NP 06/23/2018, 12:51 PM

## 2018-06-23 NOTE — Telephone Encounter (Signed)
Orders sent via epic fax to [facility]. Lelon Frohlich, Therapist, sports.

## 2018-06-23 NOTE — Patient Instructions (Signed)
It was a pleasure to speak with you on the phone today! Thank you for allowing [place] to continue taking care of your [facility] needs during this time.   Feel free to call as needed for questions and concerns related to your cardiac needs.   Medication Instructions:  Your physician has recommended you make the following change in your medication:   1- Torsemide- Take 1 tablet (20 mg total) once daily for 3 days, then take 1 tablet (20 mg total) once daily for weight gain of 3 pounds in 24 hrs.   2) Please allow patient to keep Torsemide in his room to self dose based on weight gain.   If you need a refill on your cardiac medications before your next appointment, please call your pharmacy.   Lab work: 1- Pt needs labs taken today (BMET). Please fax results to [phone] Attn: Ignacia Bayley, NP.  2- Pt needs labs taken in 1 week (BMET).  Please fax results to [phone] Attn: Ignacia Bayley, NP.  If you have labs (blood work) drawn today and your tests are completely normal, you will receive your results only by: Marland Kitchen MyChart Message (if you have MyChart) OR . A paper copy in the mail If you have any lab test that is abnormal or we need to change your treatment, we will call you to review the results.  Testing/Procedures: None ordered   Follow-Up: At [facility], you and your health needs are our priority.  As part of our continuing mission to provide you with exceptional heart care, we have created designated Provider Care Teams.  These Care Teams include your primary Cardiologist (physician) and Advanced Practice Providers (APPs -  Physician Assistants and Nurse Practitioners) who all work together to provide you with the care you need, when you need it. You will need a follow up appointment in 1 months. In person office visit. You may see Kathlyn Sacramento, MD or Murray Hodgkins, NP.

## 2018-06-24 ENCOUNTER — Encounter: Payer: Self-pay | Admitting: Cardiovascular Disease

## 2018-06-29 ENCOUNTER — Telehealth: Payer: Self-pay | Admitting: Nurse Practitioner

## 2018-06-29 NOTE — Telephone Encounter (Signed)
I agree with torsemide recs.  He really needs to have a bmet, ideally drawn on site.  I'm certain that [facility] was having the order faxed to them.  Ms. Clapper indicated that they are not allowed to leave to have labs.  If there is no way to have labs, he will need a f/u virtual visit prior to June.

## 2018-06-29 NOTE — Telephone Encounter (Signed)
Patient had a telehealth visit with Ignacia Bayley, NP on 5/7. It sounds like the patient has trouble getting access to his PRN Torsemide where he is currently living.   Weights: 12/08/17- 192 lbs 12/16/17- 193 lbs 01/07/18- 194 lbs  Currently: 06/29/18- 202.5 lbs 06/28/18- 201.5 lbs 06/26/18- 198 lbs  Per 5/7 e-visit note: I have asked him to use torsemide 20 mg daily x3 days in the setting of symptoms and weight gain as above.  [place] at [phone] to see if the patient's BMP has been drawn.  I left a message with [place] to please have the nurse call back.

## 2018-06-29 NOTE — Telephone Encounter (Signed)
I spoke with Lelon Frohlich at [name]. She reviewed the patient's chart and states she does not see labs were drawn on the patient. She states she remembers talking with one of our nurses about his patient last week, but she does not even see the lab order.   Per Lelon Frohlich, she has not seen their phlebotomist in some time due to the lock down they are under with COVID, so she is really not sure labs can be drawn in their facility at this time.   She does confirm the patient is in assisted living and his torsemide has been placed in his room to use PRN.   To Ignacia Bayley, NP to review.

## 2018-06-29 NOTE — Telephone Encounter (Signed)
Patient wife is calling, ask if patietn should start taking his fluid pill, states he has been off of it for 3 days. States he has gained weight. Today weight is 202.5. Sunday weight was 198, yesterday 201.5. please call to discuss,.

## 2018-06-30 NOTE — Telephone Encounter (Signed)
Call to Brandon Frohlich, RN to get update on labs.   She reports that labs were taken last week and she is working on locating results. They are using a new lab company and will call them and get results faxed to [place]. I told her I would like them by the end of the day if possible. She verbally agreed.   The phlebotomist is there currently to take [name] labs. She will talk with them to get everything squared away.   I gave RN our fax number.

## 2018-07-01 NOTE — Telephone Encounter (Signed)
Spoke to wife. She wanted to clarify torsemide dose and when to give PRN.   She verbalized understanding. She inquired about lab results. I reported that we have not received labs from this week. Will call back for further advice when we receive it.   Advised pt to call for any further questions or concerns.

## 2018-07-01 NOTE — Telephone Encounter (Signed)
Patient calling to discuss recent lab testing results and med changes   Please call

## 2018-07-26 ENCOUNTER — Ambulatory Visit (INDEPENDENT_AMBULATORY_CARE_PROVIDER_SITE_OTHER): Payer: Medicare Other | Admitting: Nurse Practitioner

## 2018-07-26 ENCOUNTER — Encounter: Payer: Self-pay | Admitting: Nurse Practitioner

## 2018-07-26 ENCOUNTER — Telehealth: Payer: Self-pay

## 2018-07-26 ENCOUNTER — Other Ambulatory Visit: Payer: Self-pay

## 2018-07-26 VITALS — BP 132/58 | HR 64 | Ht 70.0 in | Wt 205.0 lb

## 2018-07-26 DIAGNOSIS — N183 Chronic kidney disease, stage 3 unspecified: Secondary | ICD-10-CM

## 2018-07-26 DIAGNOSIS — I10 Essential (primary) hypertension: Secondary | ICD-10-CM

## 2018-07-26 DIAGNOSIS — I2510 Atherosclerotic heart disease of native coronary artery without angina pectoris: Secondary | ICD-10-CM | POA: Diagnosis not present

## 2018-07-26 DIAGNOSIS — I5033 Acute on chronic diastolic (congestive) heart failure: Secondary | ICD-10-CM

## 2018-07-26 DIAGNOSIS — E785 Hyperlipidemia, unspecified: Secondary | ICD-10-CM | POA: Diagnosis not present

## 2018-07-26 MED ORDER — TORSEMIDE 20 MG PO TABS: ORAL_TABLET | ORAL | 2 refills | Status: DC

## 2018-07-26 NOTE — Patient Instructions (Signed)
Medication Instructions:  Your physician has recommended you make the following change in your medication:  1- Take Torsemide 20 mg (1 tablet) by mouth once a day for 5 days, then take 20 mg (1 tablet) by mouth on Mondays, Wednesdays and Fridays.   If you need a refill on your cardiac medications before your next appointment, please call your pharmacy.   Lab work: Your physician recommends that you return for lab work ON Thursday, June 18TH, 2020 AT [place]. - [facility] to the [facility]. You will check in at the front desk to the right as you walk into the atrium. Valet Parking is offered if needed. - You will need to wear a mask.    If you have labs (blood work) drawn today and your tests are completely normal, you will receive your results only by: Marland Kitchen MyChart Message (if you have MyChart) OR . A paper copy in the mail If you have any lab test that is abnormal or we need to change your treatment, we will call you to review the results.  Testing/Procedures: - None ordered.   Follow-Up: At [facility], you and your health needs are our priority.  As part of our continuing mission to provide you with exceptional heart care, we have created designated Provider Care Teams.  These Care Teams include your primary Cardiologist (physician) and Advanced Practice Providers (APPs -  Physician Assistants and Nurse Practitioners) who all work together to provide you with the care you need, when you need it. You will need a follow up appointment in 1 months.  Please call our office 2 months in advance to schedule this appointment.  You may see Kathlyn Sacramento, MD or one of the following Advanced Practice Providers on your designated Care Team:   Murray Hodgkins, NP Christell Faith, PA-C . Marrianne Mood, PA-C

## 2018-07-26 NOTE — Telephone Encounter (Signed)
COVID-19 Pre-Screening Questions:   ?   In the past 7 to 10 days have you had a cough, shortness of breath, headache, congestion, fever (100 or greater) body aches, chills, sore throat, or sudden loss of taste or sense of smell?  No  Have you been around anyone with known Covid 19. No  Have you been around anyone who is awaiting Covid 19 test results in the past 7 to 10 days? No Have you been around anyone who has been exposed to Covid 19, or has mentioned symptoms of Covid 19 within the past 7 to 10 days? No  If you have any concerns/questions about symptoms patients report during screening (either on the phone or at [facility]). Contact the provider seeing the patient or DOD for further guidance. If neither are available contact a member of the leadership team."    Patient has been tested by the Health Dept. And tested neg.

## 2018-07-26 NOTE — Progress Notes (Signed)
Office Visit    Patient Name: [name] Date of Encounter: 07/26/2018  Primary Care Provider:  System, Pcp Not In Primary Cardiologist:  Kathlyn Sacramento, MD  Chief Complaint    85 year old male with a history of [name], hypertension, hyperlipidemia, chronic back pain, right lower extremity cellulitis (resolved), and HFpEF, who presents for follow-up related to weight gain and increased abdominal girth.  Past Medical History    Past Medical History:  Diagnosis Date   (HFpEF) heart failure with preserved ejection fraction ([place])    a. 11/2017 Echo: EF 60-65%, no rwma, GR1 DD. Nl RV fxn.   Anginal pain ([place])    c/o heaviness a few years ago.  stent placed and all resolved   Anxiety    BPH (benign prostatic hypertrophy)    Carotid stenosis    a. 05/2015 Carotid U/S: mild nonobs atherosclerosis. No need for f/u.   Chronic back pain    Chronic Left Pleural Effusion    a. 11/2017 Mod by CXR.   Coronary artery disease    a. 06/2013 Cath/PCI: LAD 50-70p (FFR 0.82-->PCI w/ 3.5x24 Promus DES), RCA subtotally occluded w/ L->R collats, LCX 30ost.   Dyslipidemia    GERD (gastroesophageal reflux disease)    Hx of [name]    Hyperlipidemia    Hypertension    Inguinal hernia    Lightheadedness    a. chronic, somewhat positional.   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   turned out to be something else in same family but not a problem   Major depression    Sleep apnea    will not use cpap   Past Surgical History:  Procedure Laterality Date   ANTERIOR APPROACH HEMI HIP ARTHROPLASTY Right 04/16/2016   Procedure: ANTERIOR APPROACH HEMI HIP ARTHROPLASTY;  Surgeon: Hessie Knows, MD;  Location: [facility] ORS;  Service: Orthopedics;  Laterality: Right;   APPENDECTOMY     85 years old   arm fracture Right 2008   rod in arm   [place]  06/2013   stent placed   COLONOSCOPY     EYE SURGERY Bilateral 2008   cataract extraction    FEMUR FRACTURE SURGERY Left    with rod   HEMORROIDECTOMY     75 years ago   [place] Bilateral 03/2016   right partial knee replacement, bilateral THR   KYPHOPLASTY N/A 12/30/2016   Procedure: [id number];  Surgeon: Hessie Knows, MD;  Location: [facility] ORS;  Service: Orthopedics;  Laterality: N/A;    Allergies  Allergies  Allergen Reactions   Cyclobenzaprine Other (See Comments)    Hallucination. Mental instability.  DO NOT GIVE ANY MUSCLE RELAXANTS   Baclofen Other (See Comments)    Confusion and weakness   Nsaids     Avoids due to liver.   Sulfamethoxazole-Trimethoprim Nausea Only and Other (See Comments)    Patient unaware of this as an allergy   Tolmetin Other (See Comments)    Avoids due to liver.it is an nsaid    History of Present Illness    85 year old male with the above complex past medical history including [name] status post PCI and drug-eluting stent placement to the LAD in May 2015.  He has a known subtotal occlusion of the RCA and mild disease in the left circumflex.  Other history includes hypertension, hyperlipidemia, chronic positional lightheadedness, malignant lymphoplasmacytic lymphoma, GERD, and chronic back pain with a history of vertebral fracture  status post T10 kyphoplasty.  I last talked with him via telephonic visit on May 7.  At that time, his wife reported weight gain.  Torsemide had been prescribed to be used as needed however, they did not have access to it and I therefore faxed over in order to allow them to have torsemide 20 mg tablets in his room to be used for weight gain of 2 pounds in a day or 5 pounds over the course of a week.  His wife gave him torsemide for 3 days straight as we had discussed and his weight came down into the high 190s but then after she stopped giving it to him, it has creeped back up to 203 on his home scale this morning.  He says he has been mostly feeling well with exception of  chronic low back pain which significantly limits his activity.  That said, he does walk using a walker and is able to go to the cafeteria and also around the campus of [place] some without any chest pain or dyspnea.  His increase in abdominal girth continues to bother him but he has not any lower extremity edema, PND, orthopnea, dizziness, syncope, or early satiety, or palpitations.  Home Medications    Prior to Admission medications   Medication Sig Start Date End Date Taking? Authorizing Provider  acetaminophen (TYLENOL) 500 MG tablet Take 2 tablets (1,000 mg total) by mouth every 6 (six) hours. 04/17/16   Dustin Flock, MD  acidophilus (RISAQUAD) CAPS capsule Take 1 capsule by mouth daily.    [provider]  Cholecalciferol (VITAMIN D3) 5000 units CAPS Take by mouth.    [provider]  clonazePAM (KLONOPIN) 1 MG tablet Take 1 mg by mouth 2 (two) times daily as needed for anxiety.    [provider]  diclofenac sodium (VOLTAREN) 1 % GEL Apply 1 application topically as directed. 06/21/18   [provider]  docusate sodium (COLACE) 100 MG capsule Take 100 mg by mouth 2 (two) times daily.    [provider]  doxazosin (CARDURA) 1 MG tablet Take 1 tablet (1 mg total) by mouth daily. 05/28/17   Arnetha Courser, MD  gabapentin (NEURONTIN) 100 MG capsule Take 100 mg by mouth 3 (three) times daily.    [provider]  guaiFENesin (ROBITUSSIN) 100 MG/5ML liquid Take 200 mg by mouth 3 (three) times daily as needed for cough.    [provider]  IRON PO Take by mouth daily.    [provider]  isosorbide mononitrate (IMDUR) 30 MG 24 hr tablet Take 1 tablet (30 mg total) by mouth daily. 02/15/17   Roselee Nova, MD  loperamide (IMODIUM) 2 MG capsule Take 2 mg by mouth as needed for diarrhea or loose stools.    [provider]  metoprolol tartrate (LOPRESSOR) 25 MG tablet Take 12.5 mg by mouth 2 (two) times daily.      [provider]  Multiple Vitamins-Minerals (PRESERVISION/LUTEIN PO) Take by mouth 2 (two) times daily.    [provider]  nitroGLYCERIN (NITROSTAT) 0.4 MG SL tablet Place 0.4 mg under the tongue every 5 (five) minutes as needed for chest pain.    [provider]  pantoprazole (PROTONIX) 40 MG tablet Take 1 tablet (40 mg total) by mouth daily. 03/16/17   Roselee Nova, MD  polyethylene glycol [facility] / Floria Raveling) packet Take 17 g by mouth daily.    [provider]  pravastatin (PRAVACHOL) 40  MG tablet Take 1 tablet (40 mg total) by mouth daily. 02/15/17   Roselee Nova, MD  senna (SENOKOT) 8.6 MG TABS tablet Take 1 tablet by mouth daily.    [provider]  sertraline (ZOLOFT) 100 MG tablet Take 100 mg by mouth daily.    [provider]  torsemide (DEMADEX) 20 MG tablet Take 1 tablet (20 mg total) by mouth as directed. Take 1 tablet (20 mg) once daily for 3 days, then 1 tablet daily as needed for 3 pound weight gain. 06/23/18 09/21/18  Theora Gianotti, NP  traMADol (ULTRAM) 50 MG tablet Take 50 mg by mouth 3 (three) times daily.  07/10/16   [provider]    Review of Systems    Weight gain and increasing abdominal girth.  His dyspnea is overall improved.  Chronic low back pain.  He denies chest pain, palpitations, PND, orthopnea, dizziness, syncope, edema, or early satiety.  All other systems reviewed and are otherwise negative except as noted above.  Physical Exam    VS:  BP (!) 132/58 (BP Location: Left Arm, Patient Position: Sitting, Cuff Size: Large)    Pulse 64    Ht 5\' 10"  (1.778 m)    Wt 205 lb (93 kg)    BMI 29.41 kg/m  , BMI Body mass index is 29.41 kg/m. GEN: Well nourished, well developed, in no acute distress. HEENT: normal. Neck: Supple, no JVD, carotid bruits, or masses. Cardiac: RRR, no murmurs, rubs, or gallops. No clubbing, cyanosis, edema.  Radials/DP/PT 2+ and equal bilaterally.  Respiratory:   Respirations regular and unlabored, diminished breath sounds at bilateral bases. GI: Protuberant, semifirm, nontender.  Bowel sounds present x4. MS: no deformity or atrophy. Skin: warm and dry, no rash. Neuro:  Strength and sensation are intact. Psych: Normal affect.  Accessory Clinical Findings   Basic metabolic panel from Jun 24, 2018: Sodium 137, potassium 4.8, chloride 102, CO2 26, BUN 36, creatinine 1.51, glucose 100  Assessment & Plan    1.  Acute on chronic diastolic congestive heart failure: Normal LV function by echo in October 2019.  Weight as late as November 2019 have been trending in the low to mid 190s.  He is now much more frequently trending in the 200-205 range.  He does respond well to torsemide 20 mg however, his wife has been somewhat unclear as to when to use this on an as-needed basis.  It would simplify their lives if he could just take a small dose every day.  On exam today, he has diminished breath sounds at the bases which is seemingly chronic and increased abdominal girth.  His weight was 203 on his home scale and 205 on our scale. Interestingly, his dyspnea has been stable.,  If not improved.  Basic metabolic panel evaluated in May was stable.  I have asked him to take torsemide 20 mg daily x5 days and then 20 mg on Mondays Wednesdays and Fridays.  Plan to follow-up basic metabolic panel in just over 1 week.  His heart rate and blood pressure are well controlled.  2.  Essential hypertension: Stable on beta-blocker, nitrate, and torsemide.  Will have to watch for orthostasis with resumption of a regular, albeit less frequent dose of torsemide.  3.  Hyperlipidemia: He remains on Pravachol therapy with an LDL of 23 in March 2019.  4.  Stage III chronic kidney disease: Creatinine was stable when evaluated in May at 1.51.  As he will be going on  3 times a week torsemide, I will follow-up a basic metabolic panel next week.  5.  Persistent left pleural effusion: Diminished  breath sounds on exam.  Previously seen by pulmonology and felt to be stable.  He has chronic stable dyspnea on exertion which if anything, has improved.  6.  Coronary artery disease: He has not been having any chest pain.  He remains on beta-blocker, nitrate, statin, and aspirin.  7.  Chronic low back pain: At this point, this is his biggest complaint.  He has been using tramadol but still has breakthrough pain.  He might be interested in a narcotic such as Vicodin and I have asked him to address this with his primary care provider.  8.  Disposition: Follow-up basic metabolic panel in 1 week.  Follow-up in clinic in 1 month or sooner if necessary.   Murray Hodgkins, NP 07/26/2018, 12:38 PM

## 2018-08-04 ENCOUNTER — Other Ambulatory Visit
Admission: RE | Admit: 2018-08-04 | Discharge: 2018-08-04 | Disposition: A | Discharge status: Home / Self Care | Payer: Medicare Other | Source: Ambulatory Visit | Attending: Nurse Practitioner | Admitting: Nurse Practitioner

## 2018-08-04 DIAGNOSIS — I5033 Acute on chronic diastolic (congestive) heart failure: Secondary | ICD-10-CM | POA: Insufficient documentation

## 2018-08-04 LAB — BASIC METABOLIC PANEL
Anion gap: 13 (ref 5–15)
BUN: 33 mg/dL — ABNORMAL HIGH (ref 8–23)
CO2: 25 mmol/L (ref 22–32)
Calcium: 9.4 mg/dL (ref 8.9–10.3)
Chloride: 98 mmol/L (ref 98–111)
Creatinine, Ser: 1.8 mg/dL — ABNORMAL HIGH (ref 0.61–1.24)
GFR calc Af Amer: 37 mL/min — ABNORMAL LOW (ref >60)
GFR calc non Af Amer: 32 mL/min — ABNORMAL LOW (ref >60)
Glucose, Bld: 140 mg/dL — ABNORMAL HIGH (ref 70–99)
Potassium: 3.8 mmol/L (ref 3.5–5.1)
Sodium: 136 mmol/L (ref 135–145)

## 2018-08-05 ENCOUNTER — Telehealth: Payer: Self-pay | Admitting: *Deleted

## 2018-08-05 DIAGNOSIS — I5033 Acute on chronic diastolic (congestive) heart failure: Secondary | ICD-10-CM

## 2018-08-05 DIAGNOSIS — Z79899 Other long term (current) drug therapy: Secondary | ICD-10-CM

## 2018-08-05 NOTE — Telephone Encounter (Signed)
-----   Message from Theora Gianotti, NP sent at 08/04/2018  1:59 PM EDT ----- Kidney's are a little drier after taking torsemide daily.  Cont w/ plan for three x/wk as we discussed in clinic.  F/u bmet in another week to determine stability.

## 2018-08-05 NOTE — Telephone Encounter (Signed)
No answer. Left message to call back.   

## 2018-08-10 NOTE — Telephone Encounter (Signed)
Spoke with wife and she verbalized understanding of results. States she just had a tooth pulled and they will not be able to get to the [facility]. She asked me to send order to [facility] to draw the lab work there.   Ignacia Bayley, NP aware of her request and is fine with patient having the facility to draw it.  [facility] and was connected with an operator who was off-site. She was unable to reach the staff on-site so she transferred me to a voicemail. Left message that patient needed BMET tomorrow and to call me back to let me know the order was received.

## 2018-08-11 ENCOUNTER — Other Ambulatory Visit
Admission: RE | Admit: 2018-08-11 | Discharge: 2018-08-11 | Disposition: A | Discharge status: Home / Self Care | Payer: Medicare Other | Attending: Nurse Practitioner | Admitting: Nurse Practitioner

## 2018-08-11 DIAGNOSIS — I5033 Acute on chronic diastolic (congestive) heart failure: Secondary | ICD-10-CM

## 2018-08-11 DIAGNOSIS — Z79899 Other long term (current) drug therapy: Secondary | ICD-10-CM

## 2018-08-11 LAB — BASIC METABOLIC PANEL
Anion gap: 11 (ref 5–15)
BUN: 33 mg/dL — ABNORMAL HIGH (ref 8–23)
CO2: 27 mmol/L (ref 22–32)
Calcium: 9.3 mg/dL (ref 8.9–10.3)
Chloride: 99 mmol/L (ref 98–111)
Creatinine, Ser: 1.73 mg/dL — ABNORMAL HIGH (ref 0.61–1.24)
GFR calc Af Amer: 39 mL/min — ABNORMAL LOW (ref >60)
GFR calc non Af Amer: 34 mL/min — ABNORMAL LOW (ref >60)
Glucose, Bld: 124 mg/dL — ABNORMAL HIGH (ref 70–99)
Potassium: 4.5 mmol/L (ref 3.5–5.1)
Sodium: 137 mmol/L (ref 135–145)

## 2018-08-11 NOTE — Telephone Encounter (Signed)
Patient did have lab work done at [facility] today. Awaiting provider comments.

## 2018-08-11 NOTE — Telephone Encounter (Signed)
Spoke with Anderson Malta at [facility]. They do not have anyone there who draws blood. She arranged that they will bring patient to the [facility] for the lab work today or tomorrow. Order entered.

## 2018-08-12 ENCOUNTER — Telehealth: Payer: Self-pay | Admitting: Cardiovascular Disease

## 2018-08-12 NOTE — Telephone Encounter (Signed)
Patient spouse returning call  

## 2018-08-12 NOTE — Telephone Encounter (Signed)
Returned call. DPR on file  Lmom.  Notes recorded by Theora Gianotti, NP on 08/11/2018 at 4:47 PM EDT  Kidney fxn and lytes stable. Cont current torsemide dose.

## 2018-08-23 ENCOUNTER — Telehealth: Payer: Self-pay | Admitting: Cardiovascular Disease

## 2018-08-23 ENCOUNTER — Telehealth: Payer: Self-pay

## 2018-08-23 NOTE — Telephone Encounter (Signed)

## 2018-08-23 NOTE — Telephone Encounter (Signed)
LMOM attempted to contact patient fpr Pre screening .

## 2018-08-24 ENCOUNTER — Encounter: Payer: Self-pay | Admitting: Nurse Practitioner

## 2018-08-24 ENCOUNTER — Ambulatory Visit (INDEPENDENT_AMBULATORY_CARE_PROVIDER_SITE_OTHER): Payer: Medicare Other | Admitting: Nurse Practitioner

## 2018-08-24 ENCOUNTER — Other Ambulatory Visit: Payer: Self-pay

## 2018-08-24 VITALS — BP 126/62 | HR 72 | Ht 70.0 in | Wt 201.0 lb

## 2018-08-24 DIAGNOSIS — I5032 Chronic diastolic (congestive) heart failure: Secondary | ICD-10-CM | POA: Diagnosis not present

## 2018-08-24 DIAGNOSIS — I10 Essential (primary) hypertension: Secondary | ICD-10-CM

## 2018-08-24 DIAGNOSIS — E785 Hyperlipidemia, unspecified: Secondary | ICD-10-CM

## 2018-08-24 DIAGNOSIS — N183 Chronic kidney disease, stage 3 unspecified: Secondary | ICD-10-CM

## 2018-08-24 DIAGNOSIS — I2510 Atherosclerotic heart disease of native coronary artery without angina pectoris: Secondary | ICD-10-CM | POA: Diagnosis not present

## 2018-08-24 NOTE — Patient Instructions (Signed)
Medication Instructions:  Your physician recommends that you continue on your current medications as directed. Please refer to the Current Medication list given to you today.  If you need a refill on your cardiac medications before your next appointment, please call your pharmacy.   Lab work: None ordered If you have labs (blood work) drawn today and your tests are completely normal, you will receive your results only by: Marland Kitchen MyChart Message (if you have MyChart) OR . A paper copy in the mail If you have any lab test that is abnormal or we need to change your treatment, we will call you to review the results.  Testing/Procedures: None ordered   Follow-Up: At [facility], you and your health needs are our priority.  As part of our continuing mission to provide you with exceptional heart care, we have created designated Provider Care Teams.  These Care Teams include your primary Cardiologist (physician) and Advanced Practice Providers (APPs -  Physician Assistants and Nurse Practitioners) who all work together to provide you with the care you need, when you need it. You will need a follow up appointment in 2 months.  You may see Kathlyn Sacramento, MD or Murray Hodgkins, NP.

## 2018-08-24 NOTE — Progress Notes (Signed)
Office Visit    Patient Name: [name] Date of Encounter: 08/24/2018  Primary Care Provider:  System, Pcp Not In Primary Cardiologist:  Kathlyn Sacramento, MD  Chief Complaint    85 year old male with history of [name], hypertension, hyperlipidemia, chronic back pain, right lower extremity cellulitis (resolved), and HFpEF, who presents for follow-up related to heart failure.  Past Medical History    Past Medical History:  Diagnosis Date  . (HFpEF) heart failure with preserved ejection fraction ([place])    a. 11/2017 Echo: EF 60-65%, no rwma, GR1 DD. Nl RV fxn.  . Anginal pain ([place])    c/o heaviness a few years ago.  stent placed and all resolved  . Anxiety   . BPH (benign prostatic hypertrophy)   . Carotid stenosis    a. 05/2015 Carotid U/S: mild nonobs atherosclerosis. No need for f/u.  Marland Kitchen Chronic back pain   . Chronic Left Pleural Effusion    a. 11/2017 Mod by CXR.  Marland Kitchen Coronary artery disease    a. 06/2013 Cath/PCI: LAD 50-70p (FFR 0.82-->PCI w/ 3.5x24 Promus DES), RCA subtotally occluded w/ L->R collats, LCX 30ost.  . Dyslipidemia   . GERD (gastroesophageal reflux disease)   . Hx of [name]   . Hyperlipidemia   . Hypertension   . Inguinal hernia   . Lightheadedness    a. chronic, somewhat positional.  . 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 Major depression   . Sleep apnea    will not use cpap   Past Surgical History:  Procedure Laterality Date  . ANTERIOR APPROACH HEMI HIP ARTHROPLASTY Right 04/16/2016   Procedure: ANTERIOR APPROACH HEMI HIP ARTHROPLASTY;  Surgeon: Hessie Knows, MD;  Location: [facility] ORS;  Service: Orthopedics;  Laterality: Right;  . APPENDECTOMY     85 years old  . arm fracture Right 2008   rod in arm  . CARDIAC CATHETERIZATION  2002  . CARDIAC CATHETERIZATION  06/2013   stent placed  . COLONOSCOPY    . EYE SURGERY Bilateral 2008   cataract extraction  . FEMUR FRACTURE SURGERY  Left    with rod  . HEMORROIDECTOMY     75 years ago  . HIP FRACTURE SURGERY    . JOINT REPLACEMENT Bilateral 03/2016   right partial knee replacement, bilateral THR  . KYPHOPLASTY N/A 12/30/2016   Procedure: [id number];  Surgeon: Hessie Knows, MD;  Location: [facility] ORS;  Service: Orthopedics;  Laterality: N/A;    Allergies  Allergies  Allergen Reactions  . Cyclobenzaprine Other (See Comments)    Hallucination. Mental instability.  DO NOT GIVE ANY MUSCLE RELAXANTS  . Baclofen Other (See Comments)    Confusion and weakness  . Nsaids     Avoids due to liver.  . Sulfamethoxazole-Trimethoprim Nausea Only and Other (See Comments)    Patient unaware of this as an allergy  . Tolmetin Other (See Comments)    Avoids due to liver.it is an nsaid    History of Present Illness    85 year old male with the above complex past medical history including [name] status post PCI and drug-eluting stent placement to the LAD in May 2015.  He has a known subtotal occlusion of the RCA and mild disease in the left circumflex.  Other history includes hypertension, hyperlipidemia, chronic positional lightheadedness, malignant lymphoplasmacytic lymphoma, GERD, and chronic back pain with a history of vertebral fracture status post T10 kyphoplasty.  Historically, Mr. Koury has had issues with lower extremity edema and increasing abdominal girth requiring adjustment in diuretic therapy and compounded by orthostatic and positional lightheadedness.  He had been using torsemide on an as-needed basis earlier this spring however, at his last visit on June 9, he, his wife, and I collectively decided to have him take 20 mg on Mondays, Wednesdays, and Fridays in an effort to help in trying to take out some of the gas work for his wife who is typically distributing his medicines.  Follow-up labs were relatively stable on that dosing.  Since his last visit, he has done well from a cardiac standpoint.  His weight has been  stable between 199 and 201 pounds.  His breathing has improved some.  He and his wife feel that the Monday Wednesday Friday torsemide dosing has found a happy medium, though he has not fond of his frequency of urination on those days and often wets himself.  He is not able to walk to the cafeteria without significant dyspnea.  His abdomen has also been softer.  He denies chest pain, palpitations, PND, orthopnea, dizziness, syncope, edema, or early satiety.  His biggest complaint today is ongoing low back pain.  He feels that he is inadequately treated from a pain management standpoint and plans to reach back out to his orthopedist.  Home Medications    Prior to Admission medications   Medication Sig Start Date End Date Taking? Authorizing Provider  acetaminophen (TYLENOL) 500 MG tablet Take 2 tablets (1,000 mg total) by mouth every 6 (six) hours. 04/17/16   Dustin Flock, MD  acidophilus (RISAQUAD) CAPS capsule Take 1 capsule by mouth daily.    [provider]  Cholecalciferol (VITAMIN D3) 5000 units CAPS Take by mouth.    [provider]  clonazePAM (KLONOPIN) 1 MG tablet Take 1 mg by mouth 2 (two) times daily as needed for anxiety.    [provider]  diclofenac sodium (VOLTAREN) 1 % GEL Apply 1 application topically as directed. 06/21/18   [provider]  docusate sodium (COLACE) 100 MG capsule Take 100 mg by mouth 2 (two) times daily.    [provider]  doxazosin (CARDURA) 1 MG tablet Take 1 tablet (1 mg total) by mouth daily. 05/28/17   Lada, Satira Anis, MD  guaiFENesin (ROBITUSSIN) 100 MG/5ML liquid Take 200 mg by mouth 3 (three) times daily as needed for cough.    [provider]  IRON PO Take by mouth daily.    [provider]  isosorbide mononitrate (IMDUR) 30 MG 24 hr tablet Take 1 tablet (30 mg total) by mouth daily. 02/15/17   Roselee Nova, MD  loperamide (IMODIUM) 2 MG capsule Take 2 mg by mouth as needed for diarrhea or  loose stools.    [provider]  metoprolol tartrate (LOPRESSOR) 25 MG tablet Take 12.5 mg by mouth 2 (two) times daily.     [provider]  Multiple Vitamins-Minerals (PRESERVISION/LUTEIN PO) Take by mouth 2 (two) times daily.    [provider]  nitroGLYCERIN (NITROSTAT) 0.4 MG SL tablet Place 0.4 mg under the tongue every 5 (five) minutes as needed for chest pain.    [provider]  pantoprazole (PROTONIX) 40 MG tablet Take 1 tablet (40 mg total) by mouth daily. 03/16/17   Roselee Nova, MD  polyethylene glycol [facility] / Floria Raveling) packet Take 17 g by mouth daily.    [provider]  pravastatin (PRAVACHOL) 40  MG tablet Take 1 tablet (40 mg total) by mouth daily. 02/15/17   Roselee Nova, MD  senna (SENOKOT) 8.6 MG TABS tablet Take 1 tablet by mouth daily.    [provider]  sertraline (ZOLOFT) 100 MG tablet Take 100 mg by mouth daily.    [provider]  torsemide (DEMADEX) 20 MG tablet Take 1 tablet (20 mg) by mouth once a day for 5 days, then take 1 tablet (20 mg) by mouth every Monday, Wednesday and Friday. 07/26/18   Theora Gianotti, NP  traMADol (ULTRAM) 50 MG tablet Take 50 mg by mouth 3 (three) times daily.  07/10/16   [provider]    Review of Systems    Still with some degree of ongoing dyspnea on exertion though overall this has improved and he can now walk to the cafeteria without having to rest.  He has significant chronic low back pain.  He denies chest pain, palpitations, PND, orthopnea, dizziness, syncope, edema, or early satiety.  All other systems reviewed and are otherwise negative except as noted above.  Physical Exam    VS:  BP 126/62 (BP Location: Left Arm, Patient Position: Sitting, Cuff Size: Normal)   Pulse 72   Ht 5\' 10"  (1.778 m)   Wt 201 lb (91.2 kg)   BMI 28.84 kg/m  , BMI Body mass index is 28.84 kg/m. GEN: Well nourished, well developed, in no acute distress.  HEENT: normal. Neck: Supple, no JVD, carotid bruits, or masses. Cardiac: RRR, no murmurs, rubs, or gallops. No clubbing, cyanosis, edema.  Radials/PT 2+ and equal bilaterally.  Respiratory:  Respirations regular and unlabored, diminished breath sounds at bilateral bases with bibasilar crackles. GI: Soft, nontender, nondistended, BS + x 4. MS: no deformity or atrophy. Skin: warm and dry, no rash. Neuro:  Strength and sensation are intact. Psych: Normal affect.  Accessory Clinical Findings    ECG personally reviewed by me today -regular sinus rhythm, 72, first-degree AV block- no acute changes.  Lab Results  Component Value Date   WBC 9.0 12/21/2017   HGB 12.2 (L) 12/21/2017   HCT 38.3 (L) 12/21/2017   MCV 93.4 12/21/2017   PLT 188 12/21/2017   Lab Results  Component Value Date   CREATININE 1.73 (H) 08/11/2018   BUN 33 (H) 08/11/2018   NA 137 08/11/2018   K 4.5 08/11/2018   CL 99 08/11/2018   CO2 27 08/11/2018   Lab Results  Component Value Date   ALT 14 12/21/2017   AST 24 12/21/2017   ALKPHOS 103 12/21/2017   BILITOT 0.4 12/21/2017   Lab Results  Component Value Date   CHOL 68 02/19/2016   HDL 20 (L) 02/19/2016   LDLCALC 23 02/19/2016   TRIG 123 02/19/2016   CHOLHDL 3.4 02/19/2016     Assessment & Plan    1.  Chronic diastolic congestive heart failure: Normal LV function by echo in October 2019.  Since adjusting to 3 times a week torsemide (Monday Wednesday Friday), his weight has stayed at the 1 99-200 range.  This strategy regularly scheduled torsemide has taken the burden off of his wife to try and understand when it should be used and he is euvolemic on examination today.  Recent follow-up basic metabolic panel was stable with a creatinine of 1.73.  Heart rate and blood pressure stable.  2.  Essential hypertension: Stable on beta-blocker, nitrate, and torsemide.  No complaints of orthostatic symptoms.  3.  Hyperlipidemia: Remains on Pravachol  with an LDL of  23 in March 2019.  4.  Stage III chronic kidney disease: Creatinine stable at most recent follow-up.  5.  Persistent left pleural effusion: Diminished breath sounds on exam.  This was previously felt to be stable by pulmonology and his dyspnea has been better with the addition of 3 times a week torsemide.  6.  Coronary artery disease: Denies chest pain.  He remains on beta-blocker, nitrate, statin, and aspirin.  7.  Chronic low back pain: This remains his biggest complaint.  He plans to follow-up with Ortho.  He is taking tramadol and also Tylenol without any significant impact on his pain.  He says he was previously followed by pain management and I suspect he will need them on board again.  8.  Disposition: Follow-up in clinic in 2 months or sooner if necessary.  Murray Hodgkins, NP 08/24/2018, 12:48 PM

## 2018-09-29 ENCOUNTER — Emergency Department: Payer: Medicare Other

## 2018-09-29 ENCOUNTER — Encounter: Payer: Self-pay | Admitting: Emergency Medicine

## 2018-09-29 ENCOUNTER — Other Ambulatory Visit: Payer: Self-pay

## 2018-09-29 ENCOUNTER — Emergency Department
Admission: EM | Admit: 2018-09-29 | Discharge: 2018-09-29 | Disposition: A | Discharge status: Home / Self Care | Payer: Medicare Other | Attending: Student in an Organized Health Care Education/Training Program | Admitting: Student in an Organized Health Care Education/Training Program

## 2018-09-29 DIAGNOSIS — Z96641 Presence of right artificial hip joint: Secondary | ICD-10-CM | POA: Insufficient documentation

## 2018-09-29 DIAGNOSIS — Z87891 Personal history of nicotine dependence: Secondary | ICD-10-CM | POA: Insufficient documentation

## 2018-09-29 DIAGNOSIS — Z856 Personal history of leukemia: Secondary | ICD-10-CM | POA: Insufficient documentation

## 2018-09-29 DIAGNOSIS — Y929 Unspecified place or not applicable: Secondary | ICD-10-CM | POA: Insufficient documentation

## 2018-09-29 DIAGNOSIS — Y998 Other external cause status: Secondary | ICD-10-CM | POA: Diagnosis not present

## 2018-09-29 DIAGNOSIS — Y9389 Activity, other specified: Secondary | ICD-10-CM | POA: Insufficient documentation

## 2018-09-29 DIAGNOSIS — M25511 Pain in right shoulder: Secondary | ICD-10-CM | POA: Insufficient documentation

## 2018-09-29 DIAGNOSIS — M79601 Pain in right arm: Secondary | ICD-10-CM | POA: Diagnosis not present

## 2018-09-29 DIAGNOSIS — M549 Dorsalgia, unspecified: Secondary | ICD-10-CM | POA: Diagnosis not present

## 2018-09-29 DIAGNOSIS — S0990XA Unspecified injury of head, initial encounter: Secondary | ICD-10-CM | POA: Insufficient documentation

## 2018-09-29 DIAGNOSIS — I10 Essential (primary) hypertension: Secondary | ICD-10-CM | POA: Insufficient documentation

## 2018-09-29 DIAGNOSIS — I2510 Atherosclerotic heart disease of native coronary artery without angina pectoris: Secondary | ICD-10-CM | POA: Diagnosis not present

## 2018-09-29 DIAGNOSIS — Z96642 Presence of left artificial hip joint: Secondary | ICD-10-CM | POA: Diagnosis not present

## 2018-09-29 DIAGNOSIS — Z96651 Presence of right artificial knee joint: Secondary | ICD-10-CM | POA: Insufficient documentation

## 2018-09-29 DIAGNOSIS — W01198A Fall on same level from slipping, tripping and stumbling with subsequent striking against other object, initial encounter: Secondary | ICD-10-CM | POA: Insufficient documentation

## 2018-09-29 DIAGNOSIS — Z79899 Other long term (current) drug therapy: Secondary | ICD-10-CM | POA: Diagnosis not present

## 2018-09-29 DIAGNOSIS — M7918 Myalgia, other site: Secondary | ICD-10-CM

## 2018-09-29 MED ORDER — TRAMADOL HCL 50 MG PO TABS: 50.0000 mg | ORAL_TABLET | Freq: Three times a day (TID) | ORAL | 0 refills | Status: AC | PRN

## 2018-09-29 MED ORDER — TRAMADOL HCL 50 MG PO TABS
50.0000 mg | ORAL_TABLET | Freq: Once | ORAL | Status: AC
  Administered 2018-09-29: 50 mg via ORAL
  Filled 2018-09-29: qty 1

## 2018-09-29 NOTE — ED Notes (Signed)
See triage note  Presents s/p fall yesterday  States he falls freq  Provider in room on arrival

## 2018-09-29 NOTE — ED Provider Notes (Signed)
[facility] Emergency Department Provider Note ____________________________________________  Time seen: Approximately 10:52 AM  I have reviewed the triage vital signs and the nursing notes.   HISTORY  Chief Complaint Fall, Shoulder Pain, and Back Pain    HPI [name] is a 85 y.o. male who presents to the emergency department for evaluation and treatment of pain after mechanical, non-syncopal fall yesterday.  Patient states that he was using his walker and his "feet got tangled up."  He states that he hit his head and hurt his right arm, shoulder, and back. No alleviating measures prior to arrival.  Past Medical History:  Diagnosis Date  . (HFpEF) heart failure with preserved ejection fraction ([place])    a. 11/2017 Echo: EF 60-65%, no rwma, GR1 DD. Nl RV fxn.  . Anginal pain ([place])    c/o heaviness a few years ago.  stent placed and all resolved  . Anxiety   . BPH (benign prostatic hypertrophy)   . Carotid stenosis    a. 05/2015 Carotid U/S: mild nonobs atherosclerosis. No need for f/u.  Marland Kitchen Chronic back pain   . Chronic Left Pleural Effusion    a. 11/2017 Mod by CXR.  Marland Kitchen Coronary artery disease    a. 06/2013 Cath/PCI: LAD 50-70p (FFR 0.82-->PCI w/ 3.5x24 Promus DES), RCA subtotally occluded w/ L->R collats, LCX 30ost.  . Dyslipidemia   . GERD (gastroesophageal reflux disease)   . Hx of [name]   . Hyperlipidemia   . Hypertension   . Inguinal hernia   . Lightheadedness    a. chronic, somewhat positional.  . 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 Major depression   . Sleep apnea    will not use cpap    Patient Active Problem List   Diagnosis Date Noted  . Malnutrition of moderate degree 06/15/2017  . Pneumonia 06/13/2017  . 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 S/P kyphoplasty 01/14/2017  . Respiratory failure, post-operative  ([place]) 12/31/2016  . Acute respiratory failure ([place]) 12/30/2016  . Altered mental status 12/07/2016  . Acute kidney injury ([place]) 09/14/2016  . Head injury, intracranial, without loss of consciousness or fracture ([place]) 09/07/2016  . Groin pain, left 06/15/2016  . Closed right hip fracture, with routine healing, subsequent encounter 04/15/2016  . Productive cough 03/23/2016  . Medicare annual wellness visit, subsequent 02/19/2016  . Major depression 10/14/2015  . Behavior disturbance 09/12/2015  . Mobility impaired 08/27/2015  . Skin ulcer of back ([place]) 07/01/2015  . Laceration of right hand 07/01/2015  . Traumatic hematoma of lower back 07/01/2015  . Encounters for administrative purpose 04/01/2015  . Skin lesion of face 12/24/2014  . Contusion of right hand 11/28/2014  . Herpes zoster 10/30/2014  . Need for immunization against influenza 10/24/2014  . Compression fracture of L4 lumbar vertebra 09/28/2014  . Rectal or anal pain 08/13/2014  . Anxiety disorder 07/23/2014  . BPH without obstruction/lower urinary tract symptoms 07/23/2014  . Atherosclerosis of coronary artery 07/23/2014  . Chronic LBP 07/23/2014  . Hypercholesteremia 07/23/2014  . Benign hypertension 07/23/2014  . Acid reflux 07/23/2014  . Chronic recurrent major depressive disorder ([place]) 07/23/2014  . GERD (gastroesophageal reflux disease) 07/23/2014  . Coronary artery disease involving native coronary artery without angina pectoris 07/06/2014  . Essential hypertension 07/06/2014  . Carotid stenosis 07/06/2014  . Hyperlipidemia   . H/O [name] 12/20/2011  Past Surgical History:  Procedure Laterality Date  . ANTERIOR APPROACH HEMI HIP ARTHROPLASTY Right 04/16/2016   Procedure: ANTERIOR APPROACH HEMI HIP ARTHROPLASTY;  Surgeon: Hessie Knows, MD;  Location: [facility] ORS;  Service: Orthopedics;  Laterality: Right;  . APPENDECTOMY     85 years old  . arm fracture Right 2008   rod in arm  . CARDIAC CATHETERIZATION   2002  . CARDIAC CATHETERIZATION  06/2013   stent placed  . COLONOSCOPY    . EYE SURGERY Bilateral 2008   cataract extraction  . FEMUR FRACTURE SURGERY Left    with rod  . HEMORROIDECTOMY     75 years ago  . HIP FRACTURE SURGERY    . JOINT REPLACEMENT Bilateral 03/2016   right partial knee replacement, bilateral THR  . KYPHOPLASTY N/A 12/30/2016   Procedure: [id number];  Surgeon: Hessie Knows, MD;  Location: [facility] ORS;  Service: Orthopedics;  Laterality: N/A;    Prior to Admission medications   Medication Sig Start Date End Date Taking? Authorizing Provider  acetaminophen (TYLENOL) 500 MG tablet Take 2 tablets (1,000 mg total) by mouth every 6 (six) hours. 04/17/16   Dustin Flock, MD  acidophilus (RISAQUAD) CAPS capsule Take 1 capsule by mouth daily.    [provider]  Cholecalciferol (VITAMIN D3) 5000 units CAPS Take by mouth.    [provider]  clonazePAM (KLONOPIN) 1 MG tablet Take 1 mg by mouth 2 (two) times daily as needed for anxiety.    [provider]  diclofenac sodium (VOLTAREN) 1 % GEL Apply 1 application topically as directed. 06/21/18   [provider]  docusate sodium (COLACE) 100 MG capsule Take 100 mg by mouth 2 (two) times daily.    [provider]  doxazosin (CARDURA) 1 MG tablet Take 1 tablet (1 mg total) by mouth daily. 05/28/17   Lada, Satira Anis, MD  guaiFENesin (ROBITUSSIN) 100 MG/5ML liquid Take 200 mg by mouth 3 (three) times daily as needed for cough.    [provider]  IRON PO Take by mouth daily.    [provider]  isosorbide mononitrate (IMDUR) 30 MG 24 hr tablet Take 1 tablet (30 mg total) by mouth daily. 02/15/17   Roselee Nova, MD  loperamide (IMODIUM) 2 MG capsule Take 2 mg by mouth as needed for diarrhea or loose stools.    [provider]  metoprolol tartrate (LOPRESSOR) 25 MG tablet Take 12.5 mg by mouth 2 (two) times daily.     [provider]  Multiple  Vitamins-Minerals (PRESERVISION/LUTEIN PO) Take by mouth 2 (two) times daily.    [provider]  nitroGLYCERIN (NITROSTAT) 0.4 MG SL tablet Place 0.4 mg under the tongue every 5 (five) minutes as needed for chest pain.    [provider]  pantoprazole (PROTONIX) 40 MG tablet Take 1 tablet (40 mg total) by mouth daily. 03/16/17   Roselee Nova, MD  polyethylene glycol [facility] / Floria Raveling) packet Take 17 g by mouth daily.    [provider]  pravastatin (PRAVACHOL) 40 MG tablet Take 1 tablet (40 mg total) by mouth daily. 02/15/17   Roselee Nova, MD  senna (SENOKOT) 8.6 MG TABS tablet Take 1 tablet by mouth daily.    [provider]  sertraline (ZOLOFT) 100 MG tablet Take 100 mg by mouth daily.    [provider]  torsemide (DEMADEX) 20 MG tablet Take 1 tablet (20 mg) by mouth once a day for 5 days,  then take 1 tablet (20 mg) by mouth every Monday, Wednesday and Friday. 07/26/18   Theora Gianotti, NP  traMADol (ULTRAM) 50 MG tablet Take 1 tablet (50 mg total) by mouth every 8 (eight) hours as needed. Patient to carry to his room 09/29/18   Sherrie George B, FNP    Allergies Cyclobenzaprine, Baclofen, Nsaids, Sulfamethoxazole-trimethoprim, and Tolmetin  Family History  Problem Relation Age of Onset  . Heart disease Mother   . Stroke Mother   . Diabetes Father   . Dementia Father   . Stroke Sister   . Stroke Brother   . Stroke Sister   . Stroke Sister   . Stroke Brother   . Stroke Brother     Social History Social History   Tobacco Use  . Smoking status: Former Smoker    Packs/day: 2.00    Years: 7.00    Pack years: 14.00    Types: Cigarettes    Quit date: 09/17/1953    Years since quitting: 65.0  . Smokeless tobacco: Never Used  Substance Use Topics  . Alcohol use: No    Alcohol/week: 0.0 standard drinks  . Drug use: No    Review of Systems Constitutional: Negative for fever. Cardiovascular: Negative for chest  pain. Respiratory: Negative for shortness of breath. Musculoskeletal: Positive for right shoulder/arm, neck, and upper back pain. Skin: Negative for open wounds or lesions.  Neurological: Negative for decrease in sensation  ____________________________________________   PHYSICAL EXAM:  VITAL SIGNS: ED Triage Vitals [09/29/18 0817]  Enc Vitals Group     BP (!) 133/51     Pulse Rate 86     Resp 16     Temp 98 F (36.7 C)     Temp Source Oral     SpO2 95 %     Weight      Height      Head Circumference      Peak Flow      Pain Score 8     Pain Loc      Pain Edu?      Excl. in [place]?     Constitutional: Alert and oriented. Well appearing and in no acute distress. Eyes: Conjunctivae are clear without discharge or drainage Head: Atraumatic Neck: Diffusely tender Respiratory: No cough. Respirations are even and unlabored. Musculoskeletal: Tenderness over proximal humerus. Tenderness over thoracic and parathoracic muscles. Neurologic: Awake, alert, oriented.   Skin: Covered wound on left arm (not from yesterday's fall per patient.) Otherwise, no open wounds noted.  Psychiatric: Affect and behavior are appropriate.  ____________________________________________   LABS (all labs ordered are listed, but only abnormal results are displayed)  Labs Reviewed - No data to display ____________________________________________  RADIOLOGY  CT of the head and cervical spine are negative for acute findings per radiology.  Images of the thoracic spine and right humerus are negative for fracture. ____________________________________________   PROCEDURES  Procedures  ____________________________________________   INITIAL IMPRESSION / ASSESSMENT AND PLAN / ED COURSE  Gustin L Browe is a 85 y.o. who presents to the emergency department for evaluation 1 day after mechanical, non-syncopal fall.  See above HPI for further details.  CT of the head and cervical spine are reassuring.   Images of the thoracic spine and right humerus are negative for fracture as well.  While here, the patient was given tramadol with significant improvement of pain.  He will be given a prescription for the same.  He was advised to follow-up with his primary  care provider or orthopedics for symptoms that are not improving over the week.  He was encouraged to return to the emergency department for concerns if unable to schedule an appointment.   Medications  traMADol (ULTRAM) tablet 50 mg (50 mg Oral Given 09/29/18 8264)    Pertinent labs & imaging results that were available during my care of the patient were reviewed by me and considered in my medical decision making (see chart for details).  _________________________________________   FINAL CLINICAL IMPRESSION(S) / ED DIAGNOSES  Final diagnoses:  Musculoskeletal pain    ED Discharge Orders         Ordered    traMADol (ULTRAM) 50 MG tablet  Every 8 hours PRN     09/29/18 1017           If controlled substance prescribed during this visit, 12 month history viewed on the [place] prior to issuing an initial prescription for Schedule II or III opiod.   Victorino Dike, FNP 09/29/18 1103    Merlyn Lot, MD 09/29/18 1428

## 2018-09-29 NOTE — ED Triage Notes (Signed)
Per ems, fall yesterday/ has pain right arm and shoulder and low back.  Hit his head, but no loc and no pain and no blood thinners.  From [facility].

## 2018-10-25 ENCOUNTER — Telehealth: Payer: Self-pay | Admitting: Cardiovascular Disease

## 2018-10-25 NOTE — Telephone Encounter (Signed)
error 

## 2018-10-25 NOTE — Telephone Encounter (Signed)
Patients wife wants to start him back on his fluid pill. Wife also states that patient has been having more difficulty walking lately. Patient wife would like to be advised on how to re-introduce fluid pill.   Patient is scheduled with Christell Faith 9/17

## 2018-10-25 NOTE — Telephone Encounter (Signed)
Returned the call to the patient's wife. [email or web address].
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
## 2018-11-03 ENCOUNTER — Ambulatory Visit (INDEPENDENT_AMBULATORY_CARE_PROVIDER_SITE_OTHER): Payer: Medicare Other | Admitting: Physician Assistant

## 2018-11-03 ENCOUNTER — Other Ambulatory Visit: Payer: Self-pay

## 2018-11-03 ENCOUNTER — Encounter: Payer: Self-pay | Admitting: Physician Assistant

## 2018-11-03 VITALS — BP 126/68 | HR 72 | Ht 70.0 in | Wt 208.5 lb

## 2018-11-03 DIAGNOSIS — N183 Chronic kidney disease, stage 3 unspecified: Secondary | ICD-10-CM

## 2018-11-03 DIAGNOSIS — E785 Hyperlipidemia, unspecified: Secondary | ICD-10-CM

## 2018-11-03 DIAGNOSIS — I2510 Atherosclerotic heart disease of native coronary artery without angina pectoris: Secondary | ICD-10-CM | POA: Diagnosis not present

## 2018-11-03 DIAGNOSIS — R2681 Unsteadiness on feet: Secondary | ICD-10-CM

## 2018-11-03 DIAGNOSIS — I5032 Chronic diastolic (congestive) heart failure: Secondary | ICD-10-CM | POA: Diagnosis not present

## 2018-11-03 DIAGNOSIS — J90 Pleural effusion, not elsewhere classified: Secondary | ICD-10-CM

## 2018-11-03 DIAGNOSIS — I10 Essential (primary) hypertension: Secondary | ICD-10-CM

## 2018-11-03 DIAGNOSIS — I5033 Acute on chronic diastolic (congestive) heart failure: Secondary | ICD-10-CM | POA: Diagnosis not present

## 2018-11-03 MED ORDER — ASPIRIN EC 81 MG PO TBEC: 81.0000 mg | DELAYED_RELEASE_TABLET | Freq: Every day | ORAL | 3 refills | Status: DC

## 2018-11-03 NOTE — Telephone Encounter (Signed)
Patient scheduled to see Patrice Paradise, PA on 11/03/18

## 2018-11-03 NOTE — Patient Instructions (Addendum)
Medication Instructions:  Your physician has recommended you make the following change in your medication:  1. START Aspirin 81 mg once daily 2. CONTINUE Torsemide 20 mg Monday, Wednesday, and Friday 3. TAKE EXTRA Torsemide 20 mg TODAY WHEN YOU GET BACK.  PLEASE WEIGH PATIENT EVERY MORNING   If you need a refill on your cardiac medications before your next appointment, please call your pharmacy.   Lab work: BMP done today   If you have labs (blood work) drawn today and your tests are completely normal, you will receive your results only by: Marland Kitchen MyChart Message (if you have MyChart) OR . A paper copy in the mail If you have any lab test that is abnormal or we need to change your treatment, we will call you to review the results.  Testing/Procedures: None at this time  Follow-Up: At [facility], you and your health needs are our priority.  As part of our continuing mission to provide you with exceptional heart care, we have created designated Provider Care Teams.  These Care Teams include your primary Cardiologist (physician) and Advanced Practice Providers (APPs -  Physician Assistants and Nurse Practitioners) who all work together to provide you with the care you need, when you need it. You will need a follow up appointment in 1 months.  You may see Kathlyn Sacramento, MD or one of the following Advanced Practice Providers on your designated Care Team:   Murray Hodgkins, NP Christell Faith, PA-C . Marrianne Mood, PA-C  Any Other Special Instructions Will Be Listed Below (If Applicable).

## 2018-11-04 ENCOUNTER — Telehealth: Payer: Self-pay | Admitting: Physician Assistant

## 2018-11-04 LAB — BASIC METABOLIC PANEL
BUN/Creatinine Ratio: 21 (ref 10–24)
BUN: 34 mg/dL (ref 10–36)
CO2: 25 mmol/L (ref 20–29)
Calcium: 9.5 mg/dL (ref 8.6–10.2)
Chloride: 100 mmol/L (ref 96–106)
Creatinine, Ser: 1.63 mg/dL — ABNORMAL HIGH (ref 0.76–1.27)
GFR calc Af Amer: 41 mL/min/{1.73_m2} — ABNORMAL LOW (ref >59)
GFR calc non Af Amer: 36 mL/min/{1.73_m2} — ABNORMAL LOW (ref >59)
Glucose: 131 mg/dL — ABNORMAL HIGH (ref 65–99)
Potassium: 4.7 mmol/L (ref 3.5–5.2)
Sodium: 140 mmol/L (ref 134–144)

## 2018-11-04 NOTE — Telephone Encounter (Signed)
Attempted to call the patient with results. No answer- I left a message to please call back.  

## 2018-11-04 NOTE — Telephone Encounter (Signed)
Notes recorded by Rise Mu, PA-C on 11/04/2018 at 7:14 AM EDT  Random glucose mildly elevated.  Renal function remains elevated though at his approximate baseline.  Potassium at goal.  I would like for patient to take another torsemide 20 mg on Saturday, 11/05/2018.  Following that, he should resume his prior dosing on Mondays, Wednesdays, and Fridays.

## 2018-11-06 ENCOUNTER — Emergency Department: Payer: Medicare Other

## 2018-11-06 ENCOUNTER — Other Ambulatory Visit: Payer: Self-pay

## 2018-11-06 ENCOUNTER — Emergency Department
Admission: EM | Admit: 2018-11-06 | Discharge: 2018-11-06 | Disposition: A | Discharge status: Skilled Nursing Facility | Payer: Medicare Other | Attending: Emergency Medicine | Admitting: Emergency Medicine

## 2018-11-06 DIAGNOSIS — S0101XA Laceration without foreign body of scalp, initial encounter: Secondary | ICD-10-CM | POA: Diagnosis not present

## 2018-11-06 DIAGNOSIS — Z87891 Personal history of nicotine dependence: Secondary | ICD-10-CM | POA: Insufficient documentation

## 2018-11-06 DIAGNOSIS — W01198A Fall on same level from slipping, tripping and stumbling with subsequent striking against other object, initial encounter: Secondary | ICD-10-CM | POA: Insufficient documentation

## 2018-11-06 DIAGNOSIS — S0990XA Unspecified injury of head, initial encounter: Secondary | ICD-10-CM | POA: Diagnosis present

## 2018-11-06 DIAGNOSIS — Y999 Unspecified external cause status: Secondary | ICD-10-CM | POA: Insufficient documentation

## 2018-11-06 DIAGNOSIS — I2510 Atherosclerotic heart disease of native coronary artery without angina pectoris: Secondary | ICD-10-CM | POA: Insufficient documentation

## 2018-11-06 DIAGNOSIS — I10 Essential (primary) hypertension: Secondary | ICD-10-CM | POA: Diagnosis not present

## 2018-11-06 DIAGNOSIS — Y929 Unspecified place or not applicable: Secondary | ICD-10-CM | POA: Diagnosis not present

## 2018-11-06 DIAGNOSIS — W19XXXA Unspecified fall, initial encounter: Secondary | ICD-10-CM

## 2018-11-06 DIAGNOSIS — Z7982 Long term (current) use of aspirin: Secondary | ICD-10-CM | POA: Diagnosis not present

## 2018-11-06 DIAGNOSIS — Y9301 Activity, walking, marching and hiking: Secondary | ICD-10-CM | POA: Diagnosis not present

## 2018-11-06 LAB — BASIC METABOLIC PANEL
Anion gap: 12 (ref 5–15)
BUN: 39 mg/dL — ABNORMAL HIGH (ref 8–23)
CO2: 27 mmol/L (ref 22–32)
Calcium: 9.3 mg/dL (ref 8.9–10.3)
Chloride: 99 mmol/L (ref 98–111)
Creatinine, Ser: 1.51 mg/dL — ABNORMAL HIGH (ref 0.61–1.24)
GFR calc Af Amer: 45 mL/min — ABNORMAL LOW (ref >60)
GFR calc non Af Amer: 39 mL/min — ABNORMAL LOW (ref >60)
Glucose, Bld: 107 mg/dL — ABNORMAL HIGH (ref 70–99)
Potassium: 4.6 mmol/L (ref 3.5–5.1)
Sodium: 138 mmol/L (ref 135–145)

## 2018-11-06 LAB — CBC
HCT: 35.2 % — ABNORMAL LOW (ref 39.0–52.0)
Hemoglobin: 11.1 g/dL — ABNORMAL LOW (ref 13.0–17.0)
MCH: 29.3 pg (ref 26.0–34.0)
MCHC: 31.5 g/dL (ref 30.0–36.0)
MCV: 92.9 fL (ref 80.0–100.0)
Platelets: 171 10*3/uL (ref 150–400)
RBC: 3.79 MIL/uL — ABNORMAL LOW (ref 4.22–5.81)
RDW: 13.4 % (ref 11.5–15.5)
WBC: 9.2 10*3/uL (ref 4.0–10.5)
nRBC: 0 % (ref 0.0–0.2)

## 2018-11-06 MED ORDER — LIDOCAINE-PRILOCAINE 2.5-2.5 % EX CREA
TOPICAL_CREAM | Freq: Once | CUTANEOUS | Status: AC
  Administered 2018-11-06: 1 via TOPICAL
  Filled 2018-11-06: qty 5

## 2018-11-06 NOTE — ED Notes (Signed)
Patient transported to CT 

## 2018-11-06 NOTE — Discharge Instructions (Addendum)
You have been seen in the emergency department after a fall.  Your work-up overall shows normal results.  You have suffered a 4 cm laceration to the back of your scalp.  This has been repaired with 4 staples that will need to be removed in approximately 10 to 14 days.  Please follow-up with your doctor for staple removal.  Return to the emergency department for any increased pain, fever, redness or pus, or any other symptom personally concerning to yourself or staff.

## 2018-11-06 NOTE — ED Notes (Signed)
Report given to Braham at [facility]. Pt must return EMS per [facility].

## 2018-11-06 NOTE — ED Notes (Signed)
Called [facility] for transport to [place]

## 2018-11-06 NOTE — ED Notes (Signed)
Pt states back pain is chronic.

## 2018-11-06 NOTE — ED Triage Notes (Signed)
Pt states he got tangled up in his walker and fell hitting his head on a wheelchair. Pt and staff denies LOC. Laceration to back of head per EMS. Bandaged at present, no bleeding through bandage.

## 2018-11-06 NOTE — ED Provider Notes (Signed)
[facility] Emergency Department Provider Note  Time seen: 12:10 PM  I have reviewed the triage vital signs and the nursing notes.   HISTORY  Chief Complaint Fall (mechanical)   HPI [name] is a 85 y.o. male with a past medical history of anxiety, chronic back pain, gastric reflux, [name] with right facial droop, walker dependent, presents from his nursing facility after fall.  According to the patient he ambulates with a walker, was attempting to use his walker but got tripped up on the walker causing him to fall backwards and he states he hit the back of his head on a walker from someone who was walking behind him.  Denies LOC.  Patient states mild pain in the top of his head but denies any other symptoms.  Denies any recent fever cough or shortness of breath.   Past Medical History:  Diagnosis Date  . (HFpEF) heart failure with preserved ejection fraction ([place])    a. 11/2017 Echo: EF 60-65%, no rwma, GR1 DD. Nl RV fxn.  . Anginal pain ([place])    c/o heaviness a few years ago.  stent placed and all resolved  . Anxiety   . BPH (benign prostatic hypertrophy)   . Carotid stenosis    a. 05/2015 Carotid U/S: mild nonobs atherosclerosis. No need for f/u.  Marland Kitchen Chronic back pain   . Chronic Left Pleural Effusion    a. 11/2017 Mod by CXR.  Marland Kitchen Coronary artery disease    a. 06/2013 Cath/PCI: LAD 50-70p (FFR 0.82-->PCI w/ 3.5x24 Promus DES), RCA subtotally occluded w/ L->R collats, LCX 30ost.  . Dyslipidemia   . GERD (gastroesophageal reflux disease)   . Hx of [name]   . Hyperlipidemia   . Hypertension   . Inguinal hernia   . Lightheadedness    a. chronic, somewhat positional.  . 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 Major depression   . Sleep apnea    will not use cpap    Patient Active Problem List   Diagnosis Date Noted  . Malnutrition of moderate degree 06/15/2017  . Pneumonia  06/13/2017  . 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 S/P kyphoplasty 01/14/2017  . Respiratory failure, post-operative ([place]) 12/31/2016  . Acute respiratory failure ([place]) 12/30/2016  . Altered mental status 12/07/2016  . Acute kidney injury ([place]) 09/14/2016  . Head injury, intracranial, without loss of consciousness or fracture ([place]) 09/07/2016  . Groin pain, left 06/15/2016  . Closed right hip fracture, with routine healing, subsequent encounter 04/15/2016  . Productive cough 03/23/2016  . Medicare annual wellness visit, subsequent 02/19/2016  . Major depression 10/14/2015  . Behavior disturbance 09/12/2015  . Mobility impaired 08/27/2015  . Skin ulcer of back ([place]) 07/01/2015  . Laceration of right hand 07/01/2015  . Traumatic hematoma of lower back 07/01/2015  . Encounters for administrative purpose 04/01/2015  . Skin lesion of face 12/24/2014  . Contusion of right hand 11/28/2014  . Herpes zoster 10/30/2014  . Need for immunization against influenza 10/24/2014  . Compression fracture of L4 lumbar vertebra 09/28/2014  . Rectal or anal pain 08/13/2014  . Anxiety disorder 07/23/2014  . BPH without obstruction/lower urinary tract symptoms 07/23/2014  . Atherosclerosis of coronary artery 07/23/2014  . Chronic LBP 07/23/2014  . Hypercholesteremia 07/23/2014  . Benign hypertension 07/23/2014  . Acid reflux 07/23/2014  . Chronic recurrent major  depressive disorder ([place]) 07/23/2014  . GERD (gastroesophageal reflux disease) 07/23/2014  . Coronary artery disease involving native coronary artery without angina pectoris 07/06/2014  . Essential hypertension 07/06/2014  . Carotid stenosis 07/06/2014  . Hyperlipidemia   . H/O [name] 12/20/2011    Past Surgical History:  Procedure Laterality Date  . ANTERIOR APPROACH HEMI HIP ARTHROPLASTY Right 04/16/2016   Procedure: ANTERIOR APPROACH HEMI HIP ARTHROPLASTY;  Surgeon: Hessie Knows,  MD;  Location: [facility] ORS;  Service: Orthopedics;  Laterality: Right;  . APPENDECTOMY     85 years old  . arm fracture Right 2008   rod in arm  . CARDIAC CATHETERIZATION  2002  . CARDIAC CATHETERIZATION  06/2013   stent placed  . COLONOSCOPY    . EYE SURGERY Bilateral 2008   cataract extraction  . FEMUR FRACTURE SURGERY Left    with rod  . HEMORROIDECTOMY     75 years ago  . HIP FRACTURE SURGERY    . JOINT REPLACEMENT Bilateral 03/2016   right partial knee replacement, bilateral THR  . KYPHOPLASTY N/A 12/30/2016   Procedure: [id number];  Surgeon: Hessie Knows, MD;  Location: [facility] ORS;  Service: Orthopedics;  Laterality: N/A;    Prior to Admission medications   Medication Sig Start Date End Date Taking? Authorizing Provider  acetaminophen (TYLENOL) 500 MG tablet Take 2 tablets (1,000 mg total) by mouth every 6 (six) hours. 04/17/16   Dustin Flock, MD  acidophilus (RISAQUAD) CAPS capsule Take 1 capsule by mouth daily.    [provider]  aspirin EC 81 MG tablet Take 1 tablet (81 mg total) by mouth daily. 11/03/18   Rise Mu, PA-C  Cholecalciferol (VITAMIN D3) 5000 units CAPS Take by mouth.    [provider]  clonazePAM (KLONOPIN) 1 MG tablet Take 1 mg by mouth 2 (two) times daily as needed for anxiety.    [provider]  diclofenac sodium (VOLTAREN) 1 % GEL Apply 1 application topically as directed. 06/21/18   [provider]  docusate sodium (COLACE) 100 MG capsule Take 100 mg by mouth 2 (two) times daily.    [provider]  doxazosin (CARDURA) 1 MG tablet Take 1 tablet (1 mg total) by mouth daily. 05/28/17   Lada, Satira Anis, MD  guaiFENesin (ROBITUSSIN) 100 MG/5ML liquid Take 200 mg by mouth 3 (three) times daily as needed for cough.    [provider]  IRON PO Take by mouth daily.    [provider]  isosorbide mononitrate (IMDUR) 30 MG 24 hr tablet Take 1 tablet (30 mg total) by mouth daily. 02/15/17   Roselee Nova, MD  loperamide (IMODIUM) 2 MG capsule Take 2 mg by mouth as needed for diarrhea or loose stools.    [provider]  metoprolol tartrate (LOPRESSOR) 25 MG tablet Take 12.5 mg by mouth 2 (two) times daily.     [provider]  Multiple Vitamins-Minerals (PRESERVISION/LUTEIN PO) Take by mouth 2 (two) times daily.    [provider]  nitroGLYCERIN (NITROSTAT) 0.4 MG SL tablet Place 0.4 mg under the tongue every 5 (five) minutes as needed for chest pain.    [provider]  pantoprazole (PROTONIX) 40 MG tablet Take 1 tablet (40 mg total) by mouth daily. 03/16/17   Roselee Nova, MD  polyethylene glycol [facility] / Floria Raveling) packet Take 17 g by mouth daily.    [provider]  pravastatin (PRAVACHOL) 40 MG tablet Take 1 tablet (40  mg total) by mouth daily. 02/15/17   Roselee Nova, MD  senna (SENOKOT) 8.6 MG TABS tablet Take 1 tablet by mouth daily.    [provider]  sertraline (ZOLOFT) 100 MG tablet Take 100 mg by mouth daily.    [provider]  torsemide (DEMADEX) 20 MG tablet Take 1 tablet (20 mg) by mouth once a day for 5 days, then take 1 tablet (20 mg) by mouth every Monday, Wednesday and Friday. 07/26/18   Theora Gianotti, NP  traMADol (ULTRAM) 50 MG tablet Take 1 tablet (50 mg total) by mouth every 8 (eight) hours as needed. Patient to carry to his room 09/29/18   Sherrie George B, FNP    Allergies  Allergen Reactions  . Cyclobenzaprine Other (See Comments)    Hallucination. Mental instability.  DO NOT GIVE ANY MUSCLE RELAXANTS  . Baclofen Other (See Comments)    Confusion and weakness  . Nsaids     Avoids due to liver.  . Sulfamethoxazole-Trimethoprim Nausea Only and Other (See Comments)    Patient unaware of this as an allergy  . Tolmetin Other (See Comments)    Avoids due to liver.it is an nsaid    Family History  Problem Relation Age of Onset  . Heart disease Mother   . Stroke Mother   .  Diabetes Father   . Dementia Father   . Stroke Sister   . Stroke Brother   . Stroke Sister   . Stroke Sister   . Stroke Brother   . Stroke Brother     Social History Social History   Tobacco Use  . Smoking status: Former Smoker    Packs/day: 2.00    Years: 7.00    Pack years: 14.00    Types: Cigarettes    Quit date: 09/17/1953    Years since quitting: 65.1  . Smokeless tobacco: Never Used  Substance Use Topics  . Alcohol use: No    Alcohol/week: 0.0 standard drinks  . Drug use: No    Review of Systems Constitutional: Negative for fever.  Negative for LOC. Cardiovascular: Negative for chest pain. Respiratory: Negative for shortness of breath. Gastrointestinal: Negative for abdominal pain Musculoskeletal: Negative for musculoskeletal complaints Skin: Laceration to occipital scalp. Neurological: Negative for headache All other ROS negative  ____________________________________________   PHYSICAL EXAM:  VITAL SIGNS: ED Triage Vitals  Enc Vitals Group     BP 11/06/18 1150 138/72     Pulse Rate 11/06/18 1150 67     Resp 11/06/18 1150 12     Temp 11/06/18 1150 97.7 F (36.5 C)     Temp Source 11/06/18 1150 Oral     SpO2 11/06/18 1150 97 %     Weight 11/06/18 1153 210 lb (95.3 kg)     Height 11/06/18 1153 5\' 10"  (1.778 m)     Head Circumference --      Peak Flow --      Pain Score 11/06/18 1152 4     Pain Loc --      Pain Edu? --      Excl. in [place]? --    Constitutional: Awake alert, no acute distress.  Well-appearing.  Younger appearing than stated age Eyes: Normal exam [name]      Head: Patient has approximate 4 cm laceration to his occipital scalp, hemostatic.      Mouth/Throat: Mucous membranes are moist. Cardiovascular: Normal rate, regular rhythm.  Respiratory: Normal respiratory effort without tachypnea nor retractions. Breath sounds  are clear  Gastrointestinal: Soft and nontender. No distention.  Musculoskeletal: Nontender with normal range of motion in  all extremities.  No tenderness with joint range of motion in all extremities. Neurologic:  Normal speech and language. No gross focal neurologic deficits  Skin:  Skin is warm, dry and intact.  Psychiatric: Mood and affect are normal.   ____________________________________________    RADIOLOGY  CT scans are negative  ____________________________________________   INITIAL IMPRESSION / ASSESSMENT AND PLAN / ED COURSE  Pertinent labs & imaging results that were available during my care of the patient were reviewed by me and considered in my medical decision making (see chart for details).   Patient presents to the emergency department after mechanical fall at his nursing facility.  Has a 4 cm laceration to the occipital scalp that will need to be stapled.  We will obtain CT imaging of the head and C-spine as a precaution to rule out fracture, ICH.  We will check basic labs as a precaution.  Overall the patient appears extremely well.  We will apply EMLA to the laceration, repaired with staples.  Anticipate likely discharge home pending a nonrevealing work-up.  CT scans are negative.  Labs are largely at baseline.  Repaired patient's laceration with staples.  No complications.  We will discharge the patient back to his nursing facility.  Believes his tetanus is up-to-date.  LACERATION REPAIR Performed by: Harvest Dark Authorized by: Harvest Dark Consent: Verbal consent obtained. Risks and benefits: risks, benefits and alternatives were discussed Consent given by: patient Patient identity confirmed: provided demographic data Prepped and Draped in normal sterile fashion Wound explored  Laceration Location: Occipital scalp  Laceration Length: 4 cm  No Foreign Bodies seen or palpated  Local anesthetic: Topical lidocaine/EMLA  Anesthetic total: 3 ml  Irrigated. Amount of cleaning: standard  Skin closure: Staples  Number of staples: 4  Patient tolerance: Patient  tolerated the procedure well with no immediate complications.   [name] was evaluated in Emergency Department on 11/06/2018 for the symptoms described in the history of present illness. He was evaluated in the context of the global COVID-19 pandemic, which necessitated consideration that the patient might be at risk for infection with the SARS-CoV-2 virus that causes COVID-19. Institutional protocols and algorithms that pertain to the evaluation of patients at risk for COVID-19 are in a state of rapid change based on information released by regulatory bodies including the CDC and federal and state organizations. These policies and algorithms were followed during the patient's care in the ED.  ____________________________________________   FINAL CLINICAL IMPRESSION(S) / ED DIAGNOSES  Fall Laceration   Harvest Dark, MD 11/06/18 1335

## 2018-11-07 NOTE — Telephone Encounter (Signed)
The patient's wife called back today.  I advised her of the patient's lab results.  I have also advised her that Thurmond Butts was going to have the patient take a dose of torsemide on Saturday, but as we were unable to speak with them prior to the weekend, I asked how he was doing today.  Per Mrs. Meditz, the patient is not doing all that well today. He fell at [facility] yesterday after tripping over a lady's foot, then hitting his head against her wheelchair. He required staples be placed to his laceration. She states he is weak today and awake since ~ 5am with pain around the injury site.   Mrs. Asel is not wanting to give him his torsemide today due to his weakness and fall yesterday, he may have to go to the restroom too often. I have advised if his breathing/ edema seem stable today, that I think it is fair to hold his torsemide today so he does not run the risk of another fall.  I have also advised that she resume torsemide  tomorrow, then have him take a dose on Wednesday and Friday.  I have asked her to evaluate his symptoms on Thursday and if he is significantly more SOB/ having more swelling, then he may require a dose on Thursday.   Mrs. Klinefelter states [place] finally weighed him this weekend and he was 213.5 lbs. He is not weighed daily, but most recent weight at [facility] prior to that was 123456 lbs (date not certain. I have advised if he could be weighed weekly that would be good. Mrs. Silbert states when she asked to have the patient weighed she is often told they do not have the staff to do this.  They are in assisted living.   Mrs. Libengood verbalized understanding of the above. Will forward to [place] to as an Pharmacist, hospital / for any further recommendations.

## 2018-11-10 NOTE — Telephone Encounter (Signed)
Agree with RN note. Hopefully, he is now at a happy medium of not too weak and not volume up.
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
## 2018-12-02 ENCOUNTER — Telehealth: Payer: Self-pay | Admitting: Cardiovascular Disease

## 2018-12-02 NOTE — Telephone Encounter (Signed)
STAT if patient feels like he/she is going to faint   1) Are you dizzy now? No   2) Do you feel faint or have you passed out? No   3) Do you have any other symptoms?  Just dizzy when standing stands still for a minute and goes away   4) Have you checked your HR and BP (record if available)?  Not aware in facility and is not sure    . Already scheduled for Wednesday

## 2018-12-02 NOTE — Telephone Encounter (Signed)
LVM for return call. 

## 2018-12-05 ENCOUNTER — Ambulatory Visit: Payer: Medicare Other | Admitting: Physician Assistant

## 2018-12-06 NOTE — Telephone Encounter (Signed)
Made call to patient, spoke with wife Posey Pronto. She reports that patient has appt wednesday and does not need anything at this time.   I advised her to call back if there are any further questions or concerns.

## 2018-12-07 ENCOUNTER — Ambulatory Visit (INDEPENDENT_AMBULATORY_CARE_PROVIDER_SITE_OTHER): Payer: Medicare Other | Admitting: Physician Assistant

## 2018-12-07 ENCOUNTER — Encounter: Payer: Self-pay | Admitting: Physician Assistant

## 2018-12-07 ENCOUNTER — Other Ambulatory Visit: Payer: Self-pay

## 2018-12-07 VITALS — BP 120/70 | HR 74 | Ht 70.0 in | Wt 215.2 lb

## 2018-12-07 DIAGNOSIS — I5033 Acute on chronic diastolic (congestive) heart failure: Secondary | ICD-10-CM | POA: Diagnosis not present

## 2018-12-07 DIAGNOSIS — N183 Chronic kidney disease, stage 3 unspecified: Secondary | ICD-10-CM

## 2018-12-07 DIAGNOSIS — I10 Essential (primary) hypertension: Secondary | ICD-10-CM

## 2018-12-07 DIAGNOSIS — I2510 Atherosclerotic heart disease of native coronary artery without angina pectoris: Secondary | ICD-10-CM | POA: Diagnosis not present

## 2018-12-07 DIAGNOSIS — E785 Hyperlipidemia, unspecified: Secondary | ICD-10-CM

## 2018-12-07 DIAGNOSIS — J90 Pleural effusion, not elsewhere classified: Secondary | ICD-10-CM | POA: Diagnosis not present

## 2018-12-07 DIAGNOSIS — R2681 Unsteadiness on feet: Secondary | ICD-10-CM

## 2018-12-07 NOTE — Patient Instructions (Addendum)
Medication Instructions:  1- CHANGE Torsemide for 3 days Take 2 tablets (40 mg total) twice daily,  Then, for 2 days take 1 tablet (20 mg total) once daily Then resume normal dose, take 1 tablet (20 mg) by mouth every Monday, Wednesday and Friday.   *If you need a refill on your cardiac medications before your next appointment, please call your pharmacy*  Lab Work: 1- Your physician recommends that you have lab work today(CMET, CBC)  If you have labs (blood work) drawn today and your tests are completely normal, you will receive your results only by: Marland Kitchen MyChart Message (if you have MyChart) OR . A paper copy in the mail If you have any lab test that is abnormal or we need to change your treatment, we will call you to review the results.  Testing/Procedures: None ordered   Follow-Up: At [facility], you and your health needs are our priority.  As part of our continuing mission to provide you with exceptional heart care, we have created designated Provider Care Teams.  These Care Teams include your primary Cardiologist (physician) and Advanced Practice Providers (APPs -  Physician Assistants and Nurse Practitioners) who all work together to provide you with the care you need, when you need it.  Your next appointment:   1 week  The format for your next appointment:   In Person  Provider:    You may see Kathlyn Sacramento, MD or Christell Faith, PA-C.

## 2018-12-08 LAB — COMPREHENSIVE METABOLIC PANEL
ALT: 14 IU/L (ref 0–44)
AST: 23 IU/L (ref 0–40)
Albumin/Globulin Ratio: 0.8 — ABNORMAL LOW (ref 1.2–2.2)
Albumin: 3.7 g/dL (ref 3.5–4.6)
Alkaline Phosphatase: 111 IU/L (ref 39–117)
BUN/Creatinine Ratio: 19 (ref 10–24)
BUN: 30 mg/dL (ref 10–36)
Bilirubin Total: 0.5 mg/dL (ref 0.0–1.2)
CO2: 27 mmol/L (ref 20–29)
Calcium: 11 mg/dL — ABNORMAL HIGH (ref 8.6–10.2)
Chloride: 99 mmol/L (ref 96–106)
Creatinine, Ser: 1.61 mg/dL — ABNORMAL HIGH (ref 0.76–1.27)
GFR calc Af Amer: 42 mL/min/{1.73_m2} — ABNORMAL LOW (ref >59)
GFR calc non Af Amer: 36 mL/min/{1.73_m2} — ABNORMAL LOW (ref >59)
Globulin, Total: 4.8 g/dL — ABNORMAL HIGH (ref 1.5–4.5)
Glucose: 104 mg/dL — ABNORMAL HIGH (ref 65–99)
Potassium: 4.6 mmol/L (ref 3.5–5.2)
Sodium: 138 mmol/L (ref 134–144)
Total Protein: 8.5 g/dL (ref 6.0–8.5)

## 2018-12-08 LAB — CBC
Hematocrit: 36 % — ABNORMAL LOW (ref 37.5–51.0)
Hemoglobin: 12.3 g/dL — ABNORMAL LOW (ref 13.0–17.7)
MCH: 29.4 pg (ref 26.6–33.0)
MCHC: 34.2 g/dL (ref 31.5–35.7)
MCV: 86 fL (ref 79–97)
Platelets: 174 10*3/uL (ref 150–450)
RBC: 4.18 x10E6/uL (ref 4.14–5.80)
RDW: 13.4 % (ref 11.6–15.4)
WBC: 8.1 10*3/uL (ref 3.4–10.8)

## 2018-12-09 ENCOUNTER — Telehealth: Payer: Self-pay | Admitting: Physician Assistant

## 2018-12-09 NOTE — Telephone Encounter (Signed)
The patient/ his wife had not reviewed results in [place].  I spoke with the patient's wife about her own lab results and then proceeded to tell her about [name] results.   She is aware of [name]'s recommendations regarding his calcium level. Per [name], someone came and drew lab work on his yesterday at [facility]- she was told this was just routine lab work.  She is concerned that if his calcium needs to be followed up on, they do not really have a PCP. I advised we can discuss this at his visit next week. She states that he also was not able to start his torsemide 40 mg once daily dosing until today due to MD appt's. She is advised that [name] should take: - torsemide 20 mg- 2 tablets (40 mg) by mouth once daily x 3 days (today/ Saturday/ Sunday) - then 1 tablet (20 mg) by mouth once daily x 2 days (Monday/ Tuesday),  - then he will take 1 tablet (20 mg) Mon/ Wed/ Fridays  [name] voices understanding of the above instructions.  She is aware to have the patient keep his follow up on 12/15/18 at 9:30 am with Christell Faith, PA.

## 2018-12-09 NOTE — Telephone Encounter (Signed)
Notes recorded by Rise Mu, PA-C on 12/08/2018 at 11:09 AM EDT  Blood count is stable and at his approximate baseline.  Renal function is stable and at his baseline.  Potassium is at goal.  Protein level is low normal.  Calcium is elevated, which is new for him.  Liver function is normal.   Continue with planned diuresis.  We will recheck his calcium in follow up next week when he is seen. If his calcium remains elevated at that time he will need to follow up with his PCP for further testing.

## 2018-12-09 NOTE — Telephone Encounter (Signed)
Notes recorded by Lamar Laundry, RN on 12/09/2018 at 9:00 AM EDT  Results released to [facility].
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
## 2018-12-15 ENCOUNTER — Encounter: Payer: Self-pay | Admitting: Physician Assistant

## 2018-12-15 ENCOUNTER — Other Ambulatory Visit: Payer: Self-pay

## 2018-12-15 ENCOUNTER — Ambulatory Visit (INDEPENDENT_AMBULATORY_CARE_PROVIDER_SITE_OTHER): Payer: Medicare Other | Admitting: Physician Assistant

## 2018-12-15 VITALS — BP 130/64 | HR 80 | Ht 70.0 in | Wt 203.8 lb

## 2018-12-15 DIAGNOSIS — E785 Hyperlipidemia, unspecified: Secondary | ICD-10-CM

## 2018-12-15 DIAGNOSIS — I5033 Acute on chronic diastolic (congestive) heart failure: Secondary | ICD-10-CM

## 2018-12-15 DIAGNOSIS — N183 Chronic kidney disease, stage 3 unspecified: Secondary | ICD-10-CM | POA: Diagnosis not present

## 2018-12-15 DIAGNOSIS — J90 Pleural effusion, not elsewhere classified: Secondary | ICD-10-CM | POA: Diagnosis not present

## 2018-12-15 DIAGNOSIS — I5032 Chronic diastolic (congestive) heart failure: Secondary | ICD-10-CM | POA: Diagnosis not present

## 2018-12-15 DIAGNOSIS — I2510 Atherosclerotic heart disease of native coronary artery without angina pectoris: Secondary | ICD-10-CM

## 2018-12-15 DIAGNOSIS — I10 Essential (primary) hypertension: Secondary | ICD-10-CM

## 2018-12-15 NOTE — Patient Instructions (Signed)
Medication Instructions:  Your physician recommends that you continue on your current medications as directed. Please refer to the Current Medication list given to you today. *If you need a refill on your cardiac medications before your next appointment, please call your pharmacy*  Lab Work: Your physician recommends that you have lab work today(BMET)  If you have labs (blood work) drawn today and your tests are completely normal, you will receive your results only by: Marland Kitchen MyChart Message (if you have MyChart) OR . A paper copy in the mail If you have any lab test that is abnormal or we need to change your treatment, we will call you to review the results.  Testing/Procedures: None ordered   Follow-Up: At [facility], you and your health needs are our priority.  As part of our continuing mission to provide you with exceptional heart care, we have created designated Provider Care Teams.  These Care Teams include your primary Cardiologist (physician) and Advanced Practice Providers (APPs -  Physician Assistants and Nurse Practitioners) who all work together to provide you with the care you need, when you need it.  Your next appointment:   1 month  The format for your next appointment:   In Person  Provider:    You may see Kathlyn Sacramento, MD or Christell Faith, PA-C.

## 2018-12-16 LAB — BASIC METABOLIC PANEL
BUN/Creatinine Ratio: 20 (ref 10–24)
BUN: 39 mg/dL — ABNORMAL HIGH (ref 10–36)
CO2: 26 mmol/L (ref 20–29)
Calcium: 9.7 mg/dL (ref 8.6–10.2)
Chloride: 98 mmol/L (ref 96–106)
Creatinine, Ser: 1.92 mg/dL — ABNORMAL HIGH (ref 0.76–1.27)
GFR calc Af Amer: 34 mL/min/{1.73_m2} — ABNORMAL LOW (ref >59)
GFR calc non Af Amer: 29 mL/min/{1.73_m2} — ABNORMAL LOW (ref >59)
Glucose: 150 mg/dL — ABNORMAL HIGH (ref 65–99)
Potassium: 4.4 mmol/L (ref 3.5–5.2)
Sodium: 140 mmol/L (ref 134–144)

## 2018-12-16 NOTE — Telephone Encounter (Signed)
Patient wife does not recall below conversation  .  Please call to discuss results.

## 2018-12-19 NOTE — Telephone Encounter (Signed)
Returned the patient's wife's call. Lmom. Patient's wife called regarding lab results and recommendations given on 12/09/18. Patient was seen by Christell Faith, PA on 12/15/18. They should follow the instructions given by Thurmond Butts after that visit. Patient or his wife can call back if any questions.

## 2019-01-18 NOTE — Progress Notes (Deleted)
Cardiology Office Note    Date:  01/18/2019   ID:  [name], DOB Sep 27, 1923, MRN [id number]  PCP:  System, Pcp Not In  Cardiologist:  Kathlyn Sacramento, MD  Electrophysiologist:  None   Chief Complaint: Follow up  History of Present Illness:   [name] is a 85 y.o. male with history of [name] status post PCI as below, HFpEF, HTN with chronic positional lightheadedness, HLD, malignant lymphoplasmacytic lymphoma, CKD stage III, sleep apnea noncompliant with CPAP, [name], GERD,falls,and chronic back pain status post T10 kyphoplasty who presents for follow-up ofCAD and diastolic CHF.  Patient has known history of [name] status post PCI/DES to the LAD in 06/2013 with known subtotal occlusion of the RCA and mild disease affecting the LCx. Over the years, he has had issues with lower extremity swelling and increased abdominal girth requiring adjustment and diuretic therapy complicated by orthostasis. Most recent echo from 11/2017 showed an EF of 60 to 65%, mild concentric LVH, no regional wall motion abnormalities, grade 1 diastolic dysfunction, normal RV systolic function. Throughout the spring 2020 he was using torsemide on an as-needed basis however,in6/2020 it was recommended for him to take torsemide 20 mg scheduled on Mondays, Wednesdays, and Fridays. With this, follow-up labs were relatively stable. He was seen in the office on 08/24/2018 and had done well from a cardiac perspective. His weight hadbeen stable between 199 and 201 pounds with some improvement in his breathing. However, he was not fond of taking torsemide so frequently and would occasionally wet himself. Weight in the office that daywas noted to be 201 pounds. No changes were made.  He was seen in the office on 11/03/2018 indicating he had not been weighed at his living facility for approximately 6 weeks. He had also quit taking his torsemide for the same time duration and had only recently resumed 2 weeks prior to  his visit on Mondays, Wednesdays, and Fridays. He reported a weight at his living facility on the evening of 11/02/2018 of 212 pounds. He reported stable chronic dyspnea with no lower extremity swelling. His abdominal distention was relatively stable. He continued to note some shortness of breath when ambulating to the cafeteria. He was noted to have had one fall since he was last seen in 08/2018. Documented office weight was 176 pounds though it was felt this was inaccurate. ReDsranging between 33 and 37 (depending on weight used). He was advised to take an extra torsemide followed by resumption of his regular dosing on Mondays, Wednesdays, and Fridays.  He was seen in the ED on 11/06/2018 after suffering a mechanical fall. He did hit the back of his head. He deniedloss of consciousness. CT head and cervical spine showed no acute intracranial abnormality, scalp hematoma/laceration was noted over the upper right parietal occipital bone with no underlying fracture, no fracture or acute subluxation within the cervical spine, and incidentally noted carotid atherosclerosis. Labs showed an improving serum creatinine of 1.51 with a BUN of 39,potassium of 4.6, Hgb stable at 11.1.  He was seen in the office on 12/07/2018 and continued to note shortness of breath that was stable when compared to his prior visit.  Following the above outpatient diuresis at his last visit both the patient and wife indicated there was some improvement in his breathing as well as a drop in his weight.  Weights at his living facility ranged from 195 to 212 pounds, on varying scales.  He continued to be volume overloaded.  ReDs vest reading  of 37%.  Weight at that visit was 215 pounds with an approximate dry weight of 200 to 202 pounds.  Patient's torsemide was increased to 40 mg daily for 3 days followed by 20 mg daily for the next 2 days followed by 20 mg Monday, Wednesday, Friday thereafter.  He was seen in follow-up on  12/15/2018 reporting he felt somewhat better.  With escalation of torsemide he noted vigorous urine output with a home weight by his scale of 203.8 pounds on 12/14/2018.  Reds vest reading normal at 29%.  He was continued on torsemide 20 mg on Mondays, Wednesdays, and Fridays.  His main issues continue to appear to be related to food at his current living facility being high in sodium.  ***   Labs: 11/2018 - potassium 4.4, BUN 39, serum creatinine 1.92 11/2018 - albumin 3.7, AST/ALT normal, Hgb 12.3, PLT 174 05/2018-TSH normal,total cholesterol 89, triglyceride 196 HDL 20, LDL 30  Past Medical History:  Diagnosis Date  . (HFpEF) heart failure with preserved ejection fraction ([place])    a. 11/2017 Echo: EF 60-65%, no rwma, GR1 DD. Nl RV fxn.  . Anginal pain ([place])    c/o heaviness a few years ago.  stent placed and all resolved  . Anxiety   . BPH (benign prostatic hypertrophy)   . Carotid stenosis    a. 05/2015 Carotid U/S: mild nonobs atherosclerosis. No need for f/u.  Marland Kitchen Chronic back pain   . Chronic Left Pleural Effusion    a. 11/2017 Mod by CXR.  Marland Kitchen Coronary artery disease    a. 06/2013 Cath/PCI: LAD 50-70p (FFR 0.82-->PCI w/ 3.5x24 Promus DES), RCA subtotally occluded w/ L->R collats, LCX 30ost.  . Dyslipidemia   . GERD (gastroesophageal reflux disease)   . Hx of [name]   . Hyperlipidemia   . Hypertension   . Inguinal hernia   . Lightheadedness    a. chronic, somewhat positional.  . 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 Major depression   . Sleep apnea    will not use cpap    Past Surgical History:  Procedure Laterality Date  . ANTERIOR APPROACH HEMI HIP ARTHROPLASTY Right 04/16/2016   Procedure: ANTERIOR APPROACH HEMI HIP ARTHROPLASTY;  Surgeon: Hessie Knows, MD;  Location: [facility] ORS;  Service: Orthopedics;  Laterality: Right;  . APPENDECTOMY     85 years old  . arm fracture Right 2008   rod in arm   . CARDIAC CATHETERIZATION  2002  . CARDIAC CATHETERIZATION  06/2013   stent placed  . COLONOSCOPY    . EYE SURGERY Bilateral 2008   cataract extraction  . FEMUR FRACTURE SURGERY Left    with rod  . HEMORROIDECTOMY     77 years ago  . HIP FRACTURE SURGERY    . JOINT REPLACEMENT Bilateral 03/2016   right partial knee replacement, bilateral THR  . KYPHOPLASTY N/A 12/30/2016   Procedure: [id number];  Surgeon: Hessie Knows, MD;  Location: [facility] ORS;  Service: Orthopedics;  Laterality: N/A;    Current Medications: No outpatient medications have been marked as taking for the 01/23/19 encounter (Appointment) with Rise Mu, PA-C.    Allergies:   Cyclobenzaprine, Baclofen, Nsaids, Sulfamethoxazole-trimethoprim, and Tolmetin   Social History   Socioeconomic History  . Marital status: Married    Spouse name: Not on file  . Number of children: Not on file  . Years of education: Not on file  .  Highest education level: Not on file  Occupational History  . Not on file  Social Needs  . Financial resource strain: Not on file  . Food insecurity    Worry: Not on file    Inability: Not on file  . Transportation needs    Medical: Not on file    Non-medical: Not on file  Tobacco Use  . Smoking status: Former Smoker    Packs/day: 2.00    Years: 7.00    Pack years: 14.00    Types: Cigarettes    Quit date: 09/17/1953    Years since quitting: 65.3  . Smokeless tobacco: Never Used  Substance and Sexual Activity  . Alcohol use: No    Alcohol/week: 0.0 standard drinks  . Drug use: No  . Sexual activity: Not Currently  Lifestyle  . Physical activity    Days per week: Not on file    Minutes per session: Not on file  . Stress: Not on file  Relationships  . Social Herbalist on phone: Not on file    Gets together: Not on file    Attends religious service: Not on file    Active member of club or organization: Not on file    Attends meetings of clubs or organizations:  Not on file    Relationship status: Not on file  Other Topics Concern  . Not on file  Social History Narrative  . Not on file     Family History:  The patient's family history includes Dementia in his father; Diabetes in his father; Heart disease in his mother; Stroke in his brother, brother, brother, mother, sister, sister, and sister.  ROS:   ROS   EKGs/Labs/Other Studies Reviewed:    Studies reviewed were summarized above. The additional studies were reviewed today: 2DEcho 12/16/2017: - Left ventricle: The cavity size was normal. There was mild concentric hypertrophy. Systolic function was normal. The estimated ejection fraction was in the range of 60% to 65%. Wall motion was normal; there were no regional wall motion abnormalities. Doppler parameters are consistent with abnormal left ventricular relaxation (grade 1 diastolic dysfunction). Near cavity obliteration in systole. - Left atrium: The atrium was normal in size. - Right ventricle: Systolic function was normal. - Pulmonary arteries: Systolic pressure could not be accurately estimated.   EKG:  EKG is ordered today.  The EKG ordered today demonstrates ***  Recent Labs: 12/07/2018: ALT 14; Hemoglobin 12.3; Platelets 174 12/15/2018: BUN 39; Creatinine, Ser 1.92; Potassium 4.4; Sodium 140  Recent Lipid Panel    Component Value Date/Time   CHOL 68 02/19/2016 1030   CHOL 76 (L) 07/23/2014 1006   CHOL 60 12/14/2013 0505   TRIG 123 02/19/2016 1030   TRIG 80 12/14/2013 0505   HDL 20 (L) 02/19/2016 1030   HDL 24 (L) 07/23/2014 1006   HDL 24 (L) 12/14/2013 0505   CHOLHDL 3.4 02/19/2016 1030   VLDL 25 02/19/2016 1030   VLDL 16 12/14/2013 0505   LDLCALC 23 02/19/2016 1030   LDLCALC 36 07/23/2014 1006   LDLCALC 20 12/14/2013 0505    PHYSICAL EXAM:    VS:  There were no vitals taken for this visit.  BMI: There is no height or weight on file to calculate BMI.  Physical Exam  Wt Readings from Last  3 Encounters:  12/15/18 203 lb 12.8 oz (92.4 kg)  12/07/18 215 lb 4 oz (97.6 kg)  11/06/18 210 lb (95.3 kg)     ASSESSMENT &  PLAN:   1. ***  Disposition: F/u with Dr. Fletcher Anon or an APP in ***.   Medication Adjustments/Labs and Tests Ordered: Current medicines are reviewed at length with the patient today.  Concerns regarding medicines are outlined above. Medication changes, Labs and Tests ordered today are summarized above and listed in the Patient Instructions accessible in Encounters.   Signed, Christell Faith, PA-C 01/18/2019 9:08 AM     [facility] [address] [place] [place], [place] [address] [phone]

## 2019-01-23 ENCOUNTER — Ambulatory Visit: Payer: Medicare Other | Admitting: Physician Assistant

## 2019-02-02 IMAGING — MR MR THORACIC SPINE W/O CM
6 series · 48 of 48 positions shown · non-contrast
Comparison: Radiographs 12/04/2016.  Chest CTs 02/19/2016.

CLINICAL DATA: Mid back pain for 1 year. Pain radiates into the
lower back and is constant. History of multiple myeloma
lymphoblastic lymphoma.

EXAM:
MRI THORACIC SPINE WITHOUT CONTRAST
TECHNIQUE: Multiplanar, multisequence MR imaging of the thoracic spine was
performed. No intravenous contrast was administered.

[Series 3: sag loc for · sagittal · 5.0mm · 0.68mm/px · 3 of 7 slices shown]
[im 1/7]
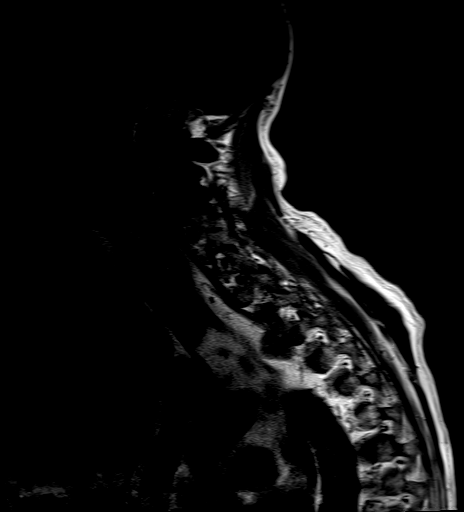
[im 4/7]
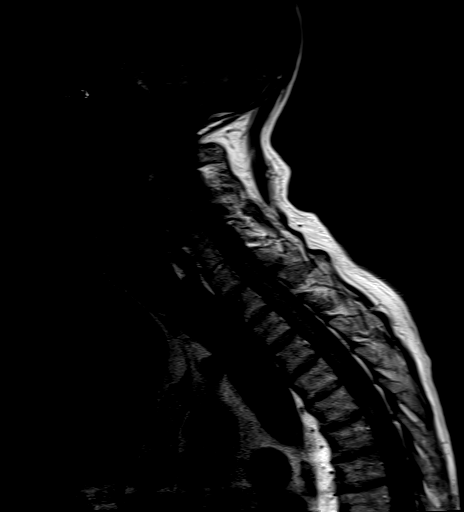
[im 7/7]
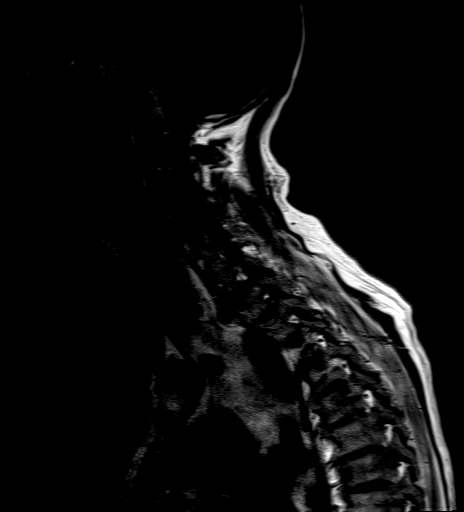

[Series 5: T2 · sagittal · 4.0mm · 1.33mm/px · 5 of 15 slices shown (1 of 2)]
[im 1/15]
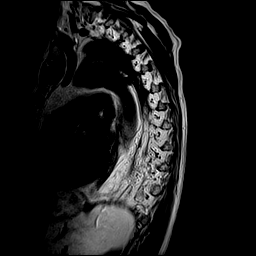
[im 4/15]
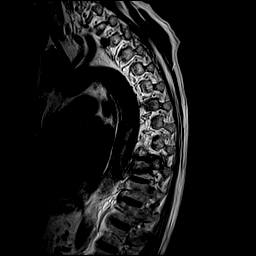
[im 8/15]
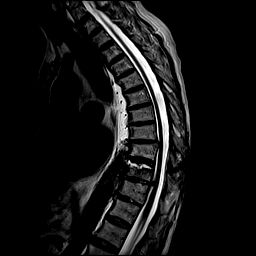
[im 11/15]
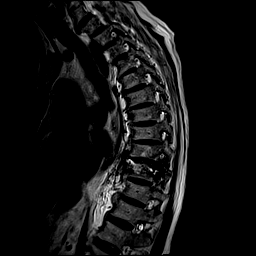
[im 15/15]
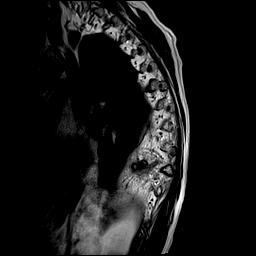

[Series 8: T2 · axial · 5.0mm · 0.86mm/px · z∈[-186,-2]mm · 15 of 42 slices shown (2 of 2)]
[im 1/42]
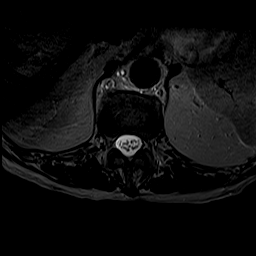
[im 3/42]
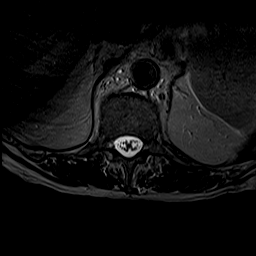
[im 6/42]
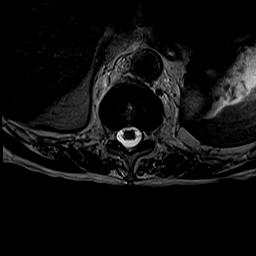
[im 9/42]
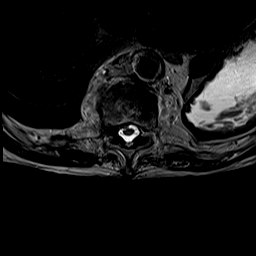
[im 12/42]
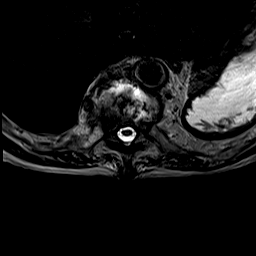
[im 15/42]
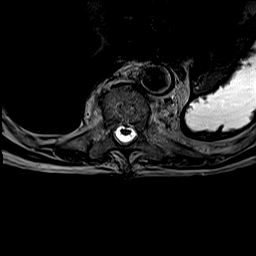
[im 18/42]
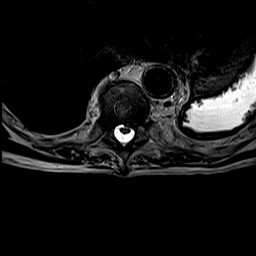
[im 21/42]
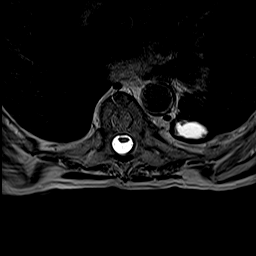
[im 24/42]
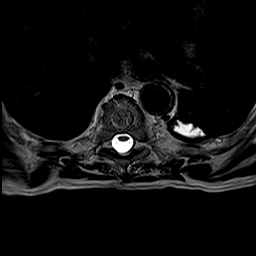
[im 27/42]
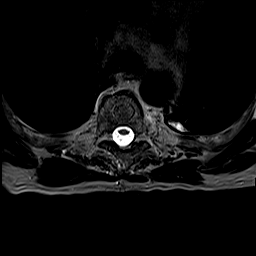
[im 30/42]
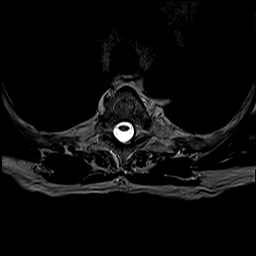
[im 33/42]
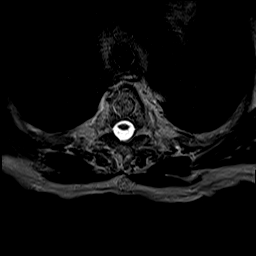
[im 36/42]
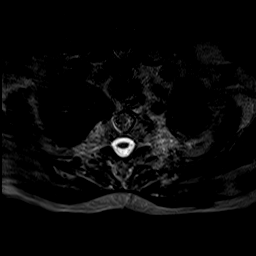
[im 39/42]
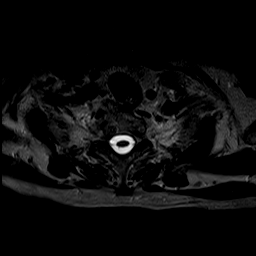
[im 42/42]
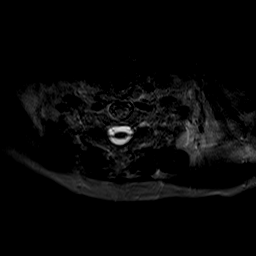

[Series 9: mpgr ax (3 · axial · 5.0mm · 0.78mm/px · z∈[-191,+2]mm · 15 of 42 slices shown]
[im 1/42]
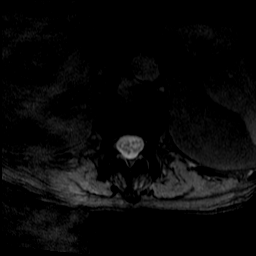
[im 3/42]
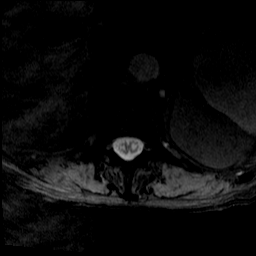
[im 6/42]
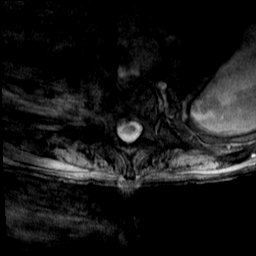
[im 9/42]
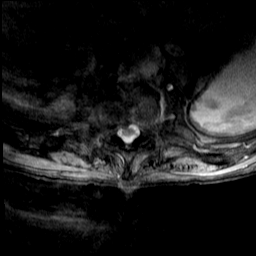
[im 12/42]
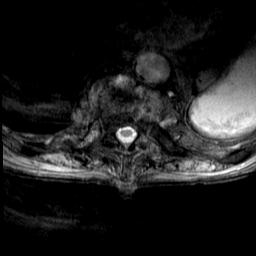
[im 15/42]
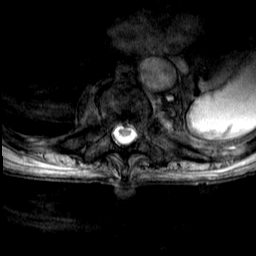
[im 18/42]
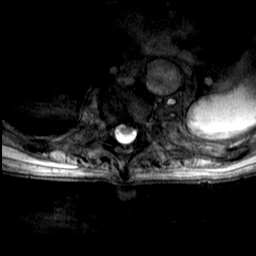
[im 21/42]
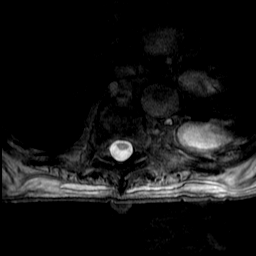
[im 24/42]
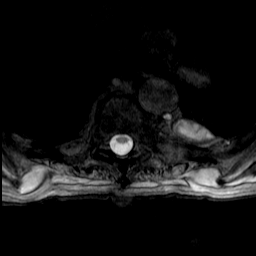
[im 27/42]
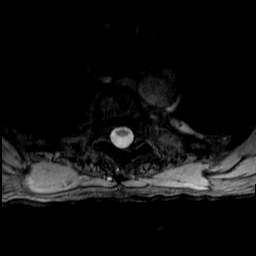
[im 30/42]
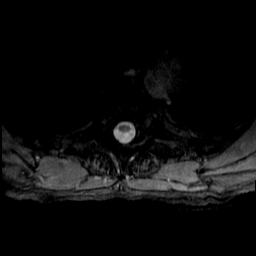
[im 33/42]
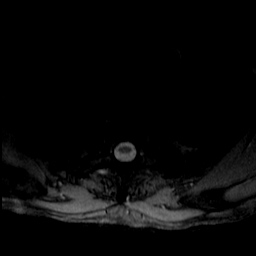
[im 36/42]
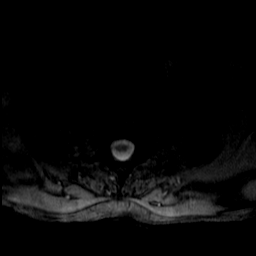
[im 39/42]
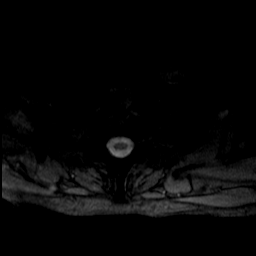
[im 42/42]
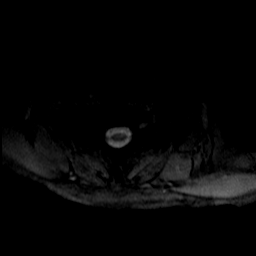

[Series 10: T1 · sagittal · 4.0mm · 1.33mm/px · 5 of 15 slices shown]
[im 1/15]
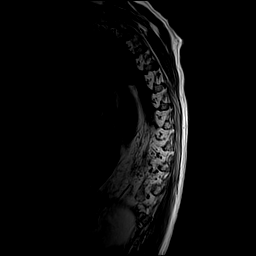
[im 4/15]
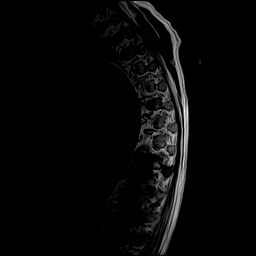
[im 8/15]
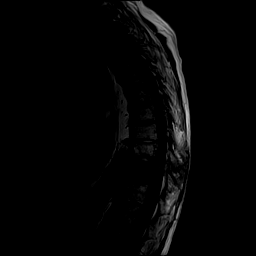
[im 11/15]
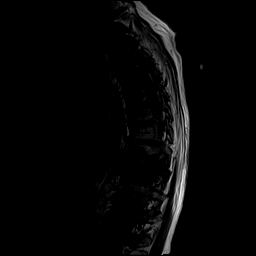
[im 15/15]
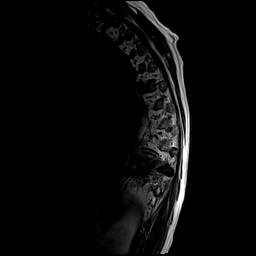

[Series 11: STIR · sagittal · 4.0mm · 1.33mm/px · 5 of 15 slices shown]
[im 1/15]
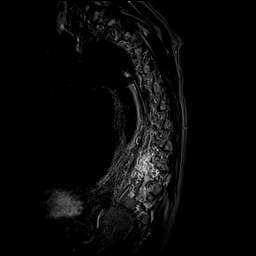
[im 4/15]
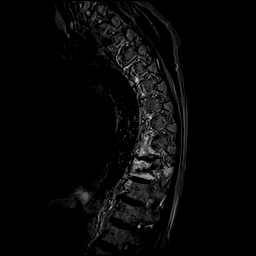
[im 8/15]
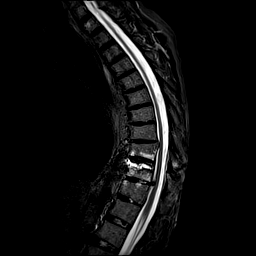
[im 11/15]
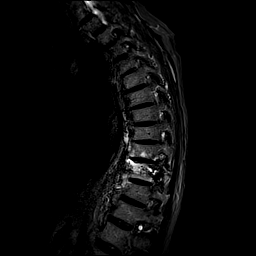
[im 15/15]
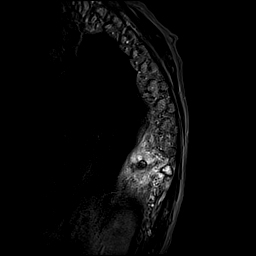

[48 of 48 positions shown; findings below may reference images not displayed]

FINDINGS: Alignment:  Normal.

Vertebrae: There is diffuse ankylosis of the cervicothoracic spine
in a pattern most consistent with diffuse idiopathic skeletal
hyperostoses. There is probable ankylosis across the C5-6 and C6-7
discs. There is a new abnormality of the T10 vertebral body, not
present on the chest CT of 10 months ago. There is linear T2
hyperintensity in the superior endplate most consistent with a
fracture. This extends into the posterior elements. There is diffuse
marrow edema throughout the T10 vertebral body with obscuration of
the normal marrow signal on T1 weighted images. There is low-level
endplate edema in the adjacent inferior endplate of T9 and superior
endplate of T11. No evidence of intervening discitis. No other
suspicious marrow lesions.

Cord: Appears mildly atrophied. No cord compression or abnormal cord
signal.

Paraspinal and other soft tissues: There is extensive paraspinal
edema surrounding the T10 abnormality, asymmetric to the right.
There is no focal fluid collection or significant epidural
component. Complex loculated left pleural effusion is similar to
previous chest CT.

Disc levels:

No significant disc space findings at or above T5-6.

T6-7:  Tiny central disc protrusion.  No cord deformity.

T7-8: No significant findings.

T8-9:  Small left paracentral disc protrusion.  No cord deformity.

T9-10: Mild disc bulging. Vertebral body and paraspinal
abnormalities as described above.

T10-11: Moderate size right paracentral disc protrusion contacts the
ventral surface of the cord. No abnormal cord signal.

T11-12:  No significant findings.

T12-L1:  No significant findings.
IMPRESSION: 1. New abnormality of the T10 vertebral body, its posterior
elements, the adjacent T9 and T11 endplates, and the surrounding
paraspinal soft tissues. Given the underlying ankylosis of the
spine, these findings could be due to a subacute Chance fracture
with surrounding inflammation. However, a pathologic fracture at T10
is a consideration, especially given the patient's history of
multiple myeloma and lymphoma.
2. Further evaluation recommended, starting with CT of the thoracic
spine. Depending on the results of that examination, one might
consider post-contrast imaging of the thoracic spine or percutaneous
T10 biopsy during spinal augmentation.
3. No signs of disc space infection, epidural fluid collection or
cord compression.
4. Scattered disc protrusions as described, largest on the right at
T10-11.
5. Stable loculated left pleural effusion.
6. These results will be called to the ordering clinician or
representative by the Radiologist Assistant, and communication
documented in the PACS or zVision Dashboard.

## 2019-02-15 ENCOUNTER — Telehealth: Payer: Self-pay | Admitting: Nurse Practitioner

## 2019-02-15 ENCOUNTER — Other Ambulatory Visit: Payer: Self-pay

## 2019-02-15 ENCOUNTER — Ambulatory Visit (INDEPENDENT_AMBULATORY_CARE_PROVIDER_SITE_OTHER): Payer: Medicare Other | Admitting: Nurse Practitioner

## 2019-02-15 ENCOUNTER — Encounter: Payer: Self-pay | Admitting: Nurse Practitioner

## 2019-02-15 VITALS — BP 100/52 | HR 76 | Ht 70.0 in | Wt 209.0 lb

## 2019-02-15 DIAGNOSIS — I5032 Chronic diastolic (congestive) heart failure: Secondary | ICD-10-CM

## 2019-02-15 DIAGNOSIS — N183 Chronic kidney disease, stage 3 unspecified: Secondary | ICD-10-CM

## 2019-02-15 DIAGNOSIS — E785 Hyperlipidemia, unspecified: Secondary | ICD-10-CM

## 2019-02-15 DIAGNOSIS — I2510 Atherosclerotic heart disease of native coronary artery without angina pectoris: Secondary | ICD-10-CM | POA: Diagnosis not present

## 2019-02-15 DIAGNOSIS — I5033 Acute on chronic diastolic (congestive) heart failure: Secondary | ICD-10-CM

## 2019-02-15 DIAGNOSIS — I10 Essential (primary) hypertension: Secondary | ICD-10-CM

## 2019-02-15 MED ORDER — TORSEMIDE 20 MG PO TABS: 20.0000 mg | ORAL_TABLET | ORAL | 2 refills | Status: DC

## 2019-02-15 NOTE — Progress Notes (Signed)
Office Visit    Patient Name: [name] Date of Encounter: 02/15/2019  Primary Care Provider:  System, Pcp Not In Primary Cardiologist:  Kathlyn Sacramento, MD  Chief Complaint    85 y/o ? w/ a h/o [name], HTN, HL, chronic back pain, right lower extremity cellulitis (resolved), and HFpEF, who presents for follow-up related to heart failure.  Past Medical History    Past Medical History:  Diagnosis Date   (HFpEF) heart failure with preserved ejection fraction ([place])    a. 11/2017 Echo: EF 60-65%, no rwma, GR1 DD. Nl RV fxn.   Anginal pain ([place])    c/o heaviness a few years ago.  stent placed and all resolved   Anxiety    BPH (benign prostatic hypertrophy)    Carotid stenosis    a. 05/2015 Carotid U/S: mild nonobs atherosclerosis. No need for f/u.   Chronic back pain    Chronic Left Pleural Effusion    a. 11/2017 Mod by CXR.   Coronary artery disease    a. 06/2013 Cath/PCI: LAD 50-70p (FFR 0.82-->PCI w/ 3.5x24 Promus DES), RCA subtotally occluded w/ L->R collats, LCX 30ost.   Dyslipidemia    GERD (gastroesophageal reflux disease)    Hx of [name]    Hyperlipidemia    Hypertension    Inguinal hernia    Lightheadedness    a. chronic, somewhat positional.   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   turned out to be something else in same family but not a problem   Major depression    Sleep apnea    will not use cpap   Past Surgical History:  Procedure Laterality Date   ANTERIOR APPROACH HEMI HIP ARTHROPLASTY Right 04/16/2016   Procedure: ANTERIOR APPROACH HEMI HIP ARTHROPLASTY;  Surgeon: Hessie Knows, MD;  Location: [facility] ORS;  Service: Orthopedics;  Laterality: Right;   APPENDECTOMY     85 years old   arm fracture Right 2008   rod in arm   [place]  06/2013   stent placed   COLONOSCOPY     EYE SURGERY Bilateral 2008   cataract extraction   FEMUR FRACTURE SURGERY Left    with rod    HEMORROIDECTOMY     77 years ago   [place] Bilateral 03/2016   right partial knee replacement, bilateral THR   KYPHOPLASTY N/A 12/30/2016   Procedure: [id number];  Surgeon: Hessie Knows, MD;  Location: [facility] ORS;  Service: Orthopedics;  Laterality: N/A;    Allergies  Allergies  Allergen Reactions   Cyclobenzaprine Other (See Comments)    Hallucination. Mental instability.  DO NOT GIVE ANY MUSCLE RELAXANTS   Other Other (See Comments)   Sulfamethoxazole Other (See Comments)   Baclofen Other (See Comments)    Confusion and weakness   Nsaids     Avoids due to liver.   Sulfamethoxazole-Trimethoprim Nausea Only and Other (See Comments)    Patient unaware of this as an allergy   Tolmetin Other (See Comments)    Avoids due to liver.it is an nsaid    History of Present Illness    85 y/o ? with the above complex past medical history including [name] status post PCI and drug-eluting stent placement to the LAD in May 2015.  He has a known subtotal occlusion of the RCA and mild disease in the left circumflex.  Other history includes hypertension, hyperlipidemia, chronic positional lightheadedness, malignant lymphoplasmacytic lymphoma, GERD, chronic  back pain with a history of vertebral fracture status post T10 kyphoplasty, and HFpEF.  Historically, Mr. Kratzer has had issues with lower extremity edema and increasing abdominal girth requiring adjustment in diuretic therapy, compounded by orthostatic and positional lightheadedness.  Over the summer, he had done well on torsemide 20 mg on Mondays, Wednesdays, and Fridays but for some reason, he stopped taking torsemide for a period of time and only resumed it in early December.  His weight did go up off of torsemide.  When he was seen on October 21, he was volume overloaded with a weight of 215.4 pounds (dry weight 200-202 pounds).  He required a brief escalation in torsemide dosing with excellent response  and weight back down to 203.8 pounds on October 29.  His ReDS Vest reading was also normal that day.  He was advised to continue torsemide 20 mg on Mondays, Wednesdays, and Fridays.  Lab work was relatively stable.  Creatinine was slightly elevated at 1.92 though that was following more aggressive diuresis.  Since his last visit, he notes relative stability.  He has chronic back pain which has not changed.  He also has chronic dyspnea on exertion and thinks he may be a little more short of breath with therapy than usual.  His wife has been very diligent about recording his weights and he has been trending in the 210-212 range over the past month.  He denies chest pain, palpitations, PND, orthopnea, dizziness, syncope, edema, or early satiety.  Abdominal girth fluctuates and currently he feels as though his abdomen is tight.  Home Medications    Prior to Admission medications   Medication Sig Start Date End Date Taking? Authorizing Provider  acetaminophen (TYLENOL) 500 MG tablet Take 2 tablets (1,000 mg total) by mouth every 6 (six) hours. 04/17/16   Dustin Flock, MD  acidophilus (RISAQUAD) CAPS capsule Take 1 capsule by mouth daily.    [provider]  aspirin 81 MG EC tablet Take 81 mg by mouth daily. Swallow whole.    [provider]  Cholecalciferol (VITAMIN D3) 5000 units CAPS Take by mouth.    [provider]  clonazePAM (KLONOPIN) 1 MG tablet Take 1 mg by mouth 2 (two) times daily as needed for anxiety.    [provider]  diclofenac sodium (VOLTAREN) 1 % GEL Apply 1 application topically as directed. 06/21/18   [provider]  docusate sodium (COLACE) 100 MG capsule Take 100 mg by mouth 2 (two) times daily.    [provider]  doxazosin (CARDURA) 1 MG tablet Take 1 tablet (1 mg total) by mouth daily. 05/28/17   Lada, Satira Anis, MD  escitalopram (LEXAPRO) 10 MG tablet Take 10 mg by mouth daily. 10/21/18   [provider]  gabapentin  (NEURONTIN) 300 MG capsule Take 300 mg by mouth at bedtime.  11/18/18   [provider]  guaiFENesin (ROBITUSSIN) 100 MG/5ML liquid Take 200 mg by mouth 3 (three) times daily as needed for cough.    [provider]  hydrocortisone 2.5 % cream  12/08/18   [provider]  isosorbide mononitrate (IMDUR) 30 MG 24 hr tablet Take 1 tablet (30 mg total) by mouth daily. 02/15/17   Roselee Nova, MD  ketoconazole (NIZORAL) 2 % cream  12/08/18   [provider]  loperamide (IMODIUM) 2 MG capsule Take 2 mg by mouth as needed for diarrhea or loose stools.    [provider]  metoprolol tartrate (LOPRESSOR) 25 MG  tablet Take 12.5 mg by mouth 2 (two) times daily.     [provider]  Multiple Vitamins-Minerals (PRESERVISION/LUTEIN PO) Take by mouth 2 (two) times daily.    [provider]  nitroGLYCERIN (NITROSTAT) 0.4 MG SL tablet Place 0.4 mg under the tongue every 5 (five) minutes as needed for chest pain.    [provider]  pantoprazole (PROTONIX) 40 MG tablet Take 1 tablet (40 mg total) by mouth daily. 03/16/17   Roselee Nova, MD  polyethylene glycol [facility] Floria Raveling) packet Take 17 g by mouth daily.    [provider]  pravastatin (PRAVACHOL) 40 MG tablet Take 1 tablet (40 mg total) by mouth daily. 02/15/17   Roselee Nova, MD  senna (SENOKOT) 8.6 MG TABS tablet Take 1 tablet by mouth daily.    [provider]  torsemide (DEMADEX) 20 MG tablet Take 1 tablet (20 mg) by mouth once a day for 5 days, then take 1 tablet (20 mg) by mouth every Monday, Wednesday and Friday. 07/26/18   Theora Gianotti, NP  traMADol (ULTRAM) 50 MG tablet Take 1 tablet (50 mg total) by mouth every 8 (eight) hours as needed. Patient to carry to his room 09/29/18   Sherrie George B, FNP    Review of Systems    Chronic dyspnea on exertion which may be slightly worse than usual.  Chronic back pain which is unchanged.   Increase in abdominal girth.  He denies chest pain, palpitations, PND, orthopnea, dizziness, syncope, edema, or early satiety.  All other systems reviewed and are otherwise negative except as noted above.  Physical Exam    VS:  BP (!) 100/52 (BP Location: Left Arm, Patient Position: Sitting, Cuff Size: Normal)    Pulse 76    Ht 5\' 10"  (1.778 m)    Wt 209 lb (94.8 kg)    BMI 29.99 kg/m  , BMI Body mass index is 29.99 kg/m. GEN: Well nourished, well developed, in no acute distress. HEENT: normal. Neck: Supple, moderately elevated JVP.  No  carotid bruits, or masses. Cardiac: RRR, no murmurs, rubs, or gallops. No clubbing, cyanosis.  Trace bilateral ankle edema.  Radials/DP/PT 2+ and equal bilaterally.  Respiratory:  Respirations regular and unlabored, bibasilar crackles. GI: Semifirm and protuberant, nontender, BS + x 4. MS: no deformity or atrophy. Skin: warm and dry, no rash. Neuro:  Strength and sensation are intact. Psych: Normal affect.  Accessory Clinical Findings    ECG personally reviewed by me today -regular sinus rhythm, 76 - no acute changes.  Lab Results  Component Value Date   WBC 8.1 12/07/2018   HGB 12.3 (L) 12/07/2018   HCT 36.0 (L) 12/07/2018   MCV 86 12/07/2018   PLT 174 12/07/2018   Lab Results  Component Value Date   CREATININE 1.92 (H) 12/15/2018   BUN 39 (H) 12/15/2018   NA 140 12/15/2018   K 4.4 12/15/2018   CL 98 12/15/2018   CO2 26 12/15/2018   Lab Results  Component Value Date   ALT 14 12/07/2018   AST 23 12/07/2018   ALKPHOS 111 12/07/2018   BILITOT 0.5 12/07/2018   Lab Results  Component Value Date   CHOL 68 02/19/2016   HDL 20 (L) 02/19/2016   LDLCALC 23 02/19/2016   TRIG 123 02/19/2016   CHOLHDL 3.4 02/19/2016    Lab Results  Component Value Date   HGBA1C 6.0 08/10/2016    Assessment & Plan    1.  Acute on chronic diastolic congestive heart failure: Normal LV function by echo in October 2019.  He previously did well on torsemide  20 mg on Mondays, Wednesdays, and Fridays but since September, his weight has been trending higher.  He did respond well to a brief escalation in torsemide dosing in October, with his weight dropping from 2 13-2 03 in the span of 8 days but since his last visit, his weight has crept back up to 210.  He does have evidence of volume overload today with bibasilar crackles, trace lower extremity swelling, JVD, and increased abdominal girth.  He also notes some worsening of dyspnea on exertion above baseline.  I have asked him to take torsemide 40 mg today and tomorrow and then take 20 mg every other day instead of just on Mondays, Wednesdays, and Fridays.  Follow-up basic metabolic panel today and I will see him back in 2 weeks, at which time I will repeat lab work.  His heart rate and blood pressure are stable.  2.  Essential hypertension: Stable on beta-blocker, nitrate, and torsemide.  No recent orthostatic symptoms.  3.  Hyperlipidemia: Remains on Pravachol therapy.  LDL was 30 in April 2020.  Normal LFTs in October.  4.  Stage III chronic kidney disease: Creatinine was slightly elevated at 1.92 following a brief escalation of torsemide in October.  I will follow-up a basic metabolic panel today.  5.  Persistent left pleural effusion: Previously evaluated by pulmonology and felt to be stable.  Dyspnea largely appears to be a function of volume excess and I am escalating torsemide as above.  6.  Coronary artery disease: No chest pain.  Remains on beta-blocker, nitrate, statin, and aspirin.  7.  Chronic low back pain: Stable on tramadol 3 times a day.  His wife notes that when he was taking it 4 times a day he was quite somnolent and she feels as though they have found a happy medium at 3 times a day.  8. Dispo: f/u bmet today.  F/u in clinic in 2 wks or sooner if necessray.   Murray Hodgkins, NP 02/15/2019, 11:35 AM

## 2019-02-15 NOTE — Patient Instructions (Signed)
Medication Instructions:  1- INCREASE Torsemide to 2 tablets (40 mg total) today and tomorrow, then take 1 tablet (20 mg total) once every other day  *If you need a refill on your cardiac medications before your next appointment, please call your pharmacy*  Lab Work: Your physician recommends that you have lab work today(BMET)  If you have labs (blood work) drawn today and your tests are completely normal, you will receive your results only by: Marland Kitchen MyChart Message (if you have MyChart) OR . A paper copy in the mail If you have any lab test that is abnormal or we need to change your treatment, we will call you to review the results.  Testing/Procedures: None ordered   Follow-Up: At [facility], you and your health needs are our priority.  As part of our continuing mission to provide you with exceptional heart care, we have created designated Provider Care Teams.  These Care Teams include your primary Cardiologist (physician) and Advanced Practice Providers (APPs -  Physician Assistants and Nurse Practitioners) who all work together to provide you with the care you need, when you need it.  Your next appointment:   2 week(s)  The format for your next appointment:   In Person  Provider:    You may see Kathlyn Sacramento, MD or Murray Hodgkins, NP.

## 2019-02-15 NOTE — Telephone Encounter (Signed)
Patient wife calling to discuss meds and rules at [facility].    Patient needs someone to call melody at [facility] house to discuss permission for patient to take on meds and to keep in living area.  They have to document this at the facility for the torsemide.   [facility] [phone].

## 2019-02-16 LAB — BASIC METABOLIC PANEL WITH GFR
BUN/Creatinine Ratio: 18 (ref 10–24)
BUN: 30 mg/dL (ref 10–36)
CO2: 23 mmol/L (ref 20–29)
Calcium: 9.4 mg/dL (ref 8.6–10.2)
Chloride: 102 mmol/L (ref 96–106)
Creatinine, Ser: 1.71 mg/dL — ABNORMAL HIGH (ref 0.76–1.27)
GFR calc Af Amer: 39 mL/min/1.73 — ABNORMAL LOW
GFR calc non Af Amer: 34 mL/min/1.73 — ABNORMAL LOW
Glucose: 148 mg/dL — ABNORMAL HIGH (ref 65–99)
Potassium: 4.5 mmol/L (ref 3.5–5.2)
Sodium: 139 mmol/L (ref 134–144)

## 2019-02-16 NOTE — Telephone Encounter (Signed)
Patient wife calling to discuss meds and rules at [facility].    Patient needs someone to call melody at [facility] house to discuss permission for patient to take on meds and to keep in living area.  They have to document this at the facility for the torsemide.   [facility] [place] [phone].    Called and spoke to Alberteen Spindle, memory care unit coordinator regarding above.  She states that the pt needs a note stating that he may have his medications in his room. Fax number is [phone].  Spoke with Ignacia Bayley, NP who states that a note maybe drafted and sent to [facility] for the pt to keep Torosemide in his living area.  Will draft letter and fax.

## 2019-03-03 ENCOUNTER — Ambulatory Visit (INDEPENDENT_AMBULATORY_CARE_PROVIDER_SITE_OTHER): Payer: Medicare Other | Admitting: Nurse Practitioner

## 2019-03-03 ENCOUNTER — Other Ambulatory Visit: Payer: Self-pay

## 2019-03-03 ENCOUNTER — Encounter: Payer: Self-pay | Admitting: Nurse Practitioner

## 2019-03-03 VITALS — BP 148/70 | HR 88 | Ht 70.0 in | Wt 215.5 lb

## 2019-03-03 DIAGNOSIS — E785 Hyperlipidemia, unspecified: Secondary | ICD-10-CM

## 2019-03-03 DIAGNOSIS — I5033 Acute on chronic diastolic (congestive) heart failure: Secondary | ICD-10-CM | POA: Diagnosis not present

## 2019-03-03 DIAGNOSIS — I10 Essential (primary) hypertension: Secondary | ICD-10-CM | POA: Diagnosis not present

## 2019-03-03 DIAGNOSIS — I2510 Atherosclerotic heart disease of native coronary artery without angina pectoris: Secondary | ICD-10-CM | POA: Diagnosis not present

## 2019-03-03 MED ORDER — METOLAZONE 2.5 MG PO TABS: ORAL_TABLET | ORAL | 1 refills | Status: DC

## 2019-03-03 MED ORDER — TORSEMIDE 20 MG PO TABS: ORAL_TABLET | ORAL | 1 refills | Status: DC

## 2019-03-03 MED ORDER — TORSEMIDE 20 MG PO TABS: ORAL_TABLET | ORAL | Status: DC

## 2019-03-03 NOTE — Patient Instructions (Signed)
Medication Instructions:  - Your physician has recommended you make the following change in your medication:   Today (Friday) & Tomorrow (Saturday): 1) Take metolazone 2.5 mg- 1 tablet once a day 2) Take torsemide 20 mg- 1 tablet once a day  Starting Monday (03/06/2019): 1) Take metolazone 2.5 mg- 1 tablet once a day on Mondays/ Wednesdays/ & Fridays only 2) Take torsemide 20 mg- 1 tablet once a day on Mondays/ Wednesdays/ & Fridays only  You have been given a signed note that you recommending that you keep your torsemide & metolazone in your room  *If you need a refill on your cardiac medications before your next appointment, please call your pharmacy*  Lab Work: - none ordered today  If you have labs (blood work) drawn today and your tests are completely normal, you will receive your results only by: Marland Kitchen MyChart Message (if you have MyChart) OR . A paper copy in the mail If you have any lab test that is abnormal or we need to change your treatment, we will call you to review the results.  Testing/Procedures: - none ordered  Follow-Up: At [facility], you and your health needs are our priority.  As part of our continuing mission to provide you with exceptional heart care, we have created designated Provider Care Teams.  These Care Teams include your primary Cardiologist (physician) and Advanced Practice Providers (APPs -  Physician Assistants and Nurse Practitioners) who all work together to provide you with the care you need, when you need it.  Your next appointment:   1 week(s)  The format for your next appointment:   In Person  Provider:   Murray Hodgkins, NP  Other Instructions n/a

## 2019-03-03 NOTE — Progress Notes (Signed)
Office Visit    Patient Name: [name] Date of Encounter: 03/03/2019  Primary Care Provider:  System, Pcp Not In Primary Cardiologist:  Kathlyn Sacramento, MD  Chief Complaint    86 year old male with a history of [name], hypertension, hyperlipidemia, chronic back pain, right lower extremity cellulitis (resolved), and HFpEF, presents for follow-up related to volume overload.  Past Medical History    Past Medical History:  Diagnosis Date  . (HFpEF) heart failure with preserved ejection fraction ([place])    a. 11/2017 Echo: EF 60-65%, no rwma, GR1 DD. Nl RV fxn.  . Anginal pain ([place])    c/o heaviness a few years ago.  stent placed and all resolved  . Anxiety   . BPH (benign prostatic hypertrophy)   . Carotid stenosis    a. 05/2015 Carotid U/S: mild nonobs atherosclerosis. No need for f/u.  Marland Kitchen Chronic back pain   . Chronic Left Pleural Effusion    a. 11/2017 Mod by CXR.  Marland Kitchen Coronary artery disease    a. 06/2013 Cath/PCI: LAD 50-70p (FFR 0.82-->PCI w/ 3.5x24 Promus DES), RCA subtotally occluded w/ L->R collats, LCX 30ost.  . Dyslipidemia   . GERD (gastroesophageal reflux disease)   . Hx of [name]   . Hyperlipidemia   . Hypertension   . Inguinal hernia   . Lightheadedness    a. chronic, somewhat positional.  . 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 Major depression   . Sleep apnea    will not use cpap   Past Surgical History:  Procedure Laterality Date  . ANTERIOR APPROACH HEMI HIP ARTHROPLASTY Right 04/16/2016   Procedure: ANTERIOR APPROACH HEMI HIP ARTHROPLASTY;  Surgeon: Hessie Knows, MD;  Location: [facility] ORS;  Service: Orthopedics;  Laterality: Right;  . APPENDECTOMY     86 years old  . arm fracture Right 2008   rod in arm  . CARDIAC CATHETERIZATION  2002  . CARDIAC CATHETERIZATION  06/2013   stent placed  . COLONOSCOPY    . EYE SURGERY Bilateral 2008   cataract extraction  . FEMUR FRACTURE SURGERY  Left    with rod  . HEMORROIDECTOMY     75 years ago  . HIP FRACTURE SURGERY    . JOINT REPLACEMENT Bilateral 03/2016   right partial knee replacement, bilateral THR  . KYPHOPLASTY N/A 12/30/2016   Procedure: [id number];  Surgeon: Hessie Knows, MD;  Location: [facility] ORS;  Service: Orthopedics;  Laterality: N/A;    Allergies  Allergies  Allergen Reactions  . Cyclobenzaprine Other (See Comments)    Hallucination. Mental instability.  DO NOT GIVE ANY MUSCLE RELAXANTS  . Other Other (See Comments)  . Sulfamethoxazole Other (See Comments)  . Baclofen Other (See Comments)    Confusion and weakness  . Nsaids     Avoids due to liver.  . Sulfamethoxazole-Trimethoprim Nausea Only and Other (See Comments)    Patient unaware of this as an allergy  . Tolmetin Other (See Comments)    Avoids due to liver.it is an nsaid    History of Present Illness    86 year old male with above complex past medical history including [name] status post PCI drug-eluting stent placement to the LAD in May 2015.  He has a known subtotal occlusion of the RCA mild disease in left circumflex.  Other history includes hypertension, hyperlipidemia, chronic positional lightheadedness, malignant lymphoplasmacytic lymphoma, GERD, chronic back pain with a history of  vertebral fracture status post T10 kyphoplasty, and HFpEF.  Historically, [name] present issues with lower extremity edema and increasing abdominal girth requiring adjustment in diuretic therapy, compounded by orthostatic and positional lightheadedness.  He previously did well on torsemide 20 mg on Mondays, Wednesdays, and Fridays but at some point came off of it with resultant weight gain.  Torsemide dosing has historically been limited by rising creatinine, along with patient's wishes to avoid daily diuretic as it resulted in incontinence.  He was seen in clinic on December 30, at which time his weight was up to 209 pounds.  Though previous dry weight was  typically in the mid 190s, over the course of the past 18 months, his dry weight rose to the low 200s.  He was volume overloaded at the time and was advised to take 40 mg of torsemide x2 days and then change from Monday Wednesday Friday torsemide to every other day torsemide.  He responded well to the higher dose over 2 days with a reduction in weight of about 4 pounds but since resuming 20 mg every other day, his weight has crept back up to 208-210 on his home scale.  He is listed as to 15.8 pounds on our scale today though he says this is the result of heavy clothing as he was to wait on his home scale.  With persistent volume excess, he has noted significant abdominal bloating and increasing girth with associated dyspnea on exertion.  He denies PND, orthopnea, chest pain, palpitations, dizziness, syncope, lower extremity edema, or early satiety.  He and his wife are very careful with salt intake.  Home Medications    Prior to Admission medications   Medication Sig Start Date End Date Taking? Authorizing Provider  acetaminophen (TYLENOL) 500 MG tablet Take 2 tablets (1,000 mg total) by mouth every 6 (six) hours. 04/17/16  Yes Dustin Flock, MD  acidophilus (RISAQUAD) CAPS capsule Take 1 capsule by mouth daily.   Yes [provider]  aspirin 81 MG EC tablet Take 81 mg by mouth daily. Swallow whole.   Yes [provider]  Cholecalciferol (VITAMIN D3) 5000 units CAPS Take by mouth.   Yes [provider]  clonazePAM (KLONOPIN) 1 MG tablet Take 1 mg by mouth 2 (two) times daily as needed for anxiety.   Yes [provider]  diclofenac sodium (VOLTAREN) 1 % GEL Apply 1 application topically as directed. 06/21/18  Yes [provider]  docusate sodium (COLACE) 100 MG capsule Take 100 mg by mouth 2 (two) times daily.   Yes [provider]  doxazosin (CARDURA) 1 MG tablet Take 1 tablet (1 mg total) by mouth daily. 05/28/17  Yes Lada, Satira Anis, MD  escitalopram  (LEXAPRO) 10 MG tablet Take 10 mg by mouth daily. 10/21/18  Yes [provider]  gabapentin (NEURONTIN) 300 MG capsule Take 300 mg by mouth at bedtime.  11/18/18  Yes [provider]  guaiFENesin (ROBITUSSIN) 100 MG/5ML liquid Take 200 mg by mouth 3 (three) times daily as needed for cough.   Yes [provider]  hydrocortisone 2.5 % cream  12/08/18  Yes [provider]  isosorbide mononitrate (IMDUR) 30 MG 24 hr tablet Take 1 tablet (30 mg total) by mouth daily. 02/15/17  Yes Roselee Nova, MD  ketoconazole (NIZORAL) 2 % cream  12/08/18  Yes [provider]  loperamide (IMODIUM) 2 MG capsule Take 2 mg by mouth as needed for diarrhea or loose stools.   Yes  [provider]  metoprolol tartrate (LOPRESSOR) 25 MG tablet Take 12.5 mg by mouth 2 (two) times daily.    Yes [provider]  Multiple Vitamins-Minerals (PRESERVISION/LUTEIN PO) Take by mouth 2 (two) times daily.   Yes [provider]  nitroGLYCERIN (NITROSTAT) 0.4 MG SL tablet Place 0.4 mg under the tongue every 5 (five) minutes as needed for chest pain.   Yes [provider]  pantoprazole (PROTONIX) 40 MG tablet Take 1 tablet (40 mg total) by mouth daily. 03/16/17  Yes Roselee Nova, MD  polyethylene glycol [facility] / GLYCOLAX) packet Take 17 g by mouth daily.   Yes [provider]  pravastatin (PRAVACHOL) 40 MG tablet Take 1 tablet (40 mg total) by mouth daily. Patient taking differently: Take 40 mg by mouth 3 (three) times a week.  02/15/17  Yes Roselee Nova, MD  senna (SENOKOT) 8.6 MG TABS tablet Take 1 tablet by mouth daily.   Yes [provider]  torsemide (DEMADEX) 20 MG tablet Take 1 tablet (20 mg total) by mouth every other day. 02/15/19  Yes Theora Gianotti, NP  traMADol (ULTRAM) 50 MG tablet Take 1 tablet (50 mg total) by mouth every 8 (eight) hours as needed. Patient to carry to his room Patient taking differently:  Take 50 mg by mouth every 6 (six) hours as needed. Patient to carry to his room 09/29/18  Yes Triplett, Cari B, FNP    Review of Systems    Ongoing dyspnea on exertion with increase in abdominal girth.  He denies chest pain, palpitations, PND, orthopnea, dizziness, syncope, lower extremity edema, or early satiety.  He has chronic back pain is being managed with tramadol 3 times a day.  All other systems reviewed and are otherwise negative except as noted above.  Physical Exam    VS:  BP (!) 148/70 (BP Location: Left Arm, Patient Position: Sitting, Cuff Size: Normal)   Pulse 88   Ht 5\' 10"  (1.778 m)   Wt 215 lb 8 oz (97.8 kg)   SpO2 96%   BMI 30.92 kg/m  , BMI Body mass index is 30.92 kg/m. GEN: Well nourished, well developed, in no acute distress. HEENT: normal. Neck: Supple, moderately elevated JVD, no carotid bruits, or masses. Cardiac: RRR, no murmurs, rubs, or gallops. No clubbing, cyanosis, edema.  Radials/PT 2+ and equal bilaterally.  Respiratory:  Respirations regular and unlabored, diminished breath sounds bilaterally. GI: Obese, protuberant, semifirm, nontender, BS + x 4. MS: no deformity or atrophy. Skin: warm and dry, no rash. Neuro:  Strength and sensation are intact. Psych: Normal affect.  Accessory Clinical Findings    ECG personally reviewed by me today -regular sinus rhythm, 88, first-degree AV block, PVC's, LVH - no acute changes.  Lab Results  Component Value Date   WBC 8.1 12/07/2018   HGB 12.3 (L) 12/07/2018   HCT 36.0 (L) 12/07/2018   MCV 86 12/07/2018   PLT 174 12/07/2018   Lab Results  Component Value Date   CREATININE 1.71 (H) 02/15/2019   BUN 30 02/15/2019   NA 139 02/15/2019   K 4.5 02/15/2019   CL 102 02/15/2019   CO2 23 02/15/2019   Lab Results  Component Value Date   ALT 14 12/07/2018   AST 23 12/07/2018   ALKPHOS 111 12/07/2018   BILITOT 0.5 12/07/2018   Lab Results  Component Value Date   CHOL 68 02/19/2016   HDL 20 (L)  02/19/2016   LDLCALC 23 02/19/2016  TRIG 123 02/19/2016   CHOLHDL 3.4 02/19/2016    Lab Results  Component Value Date   HGBA1C 6.0 08/10/2016    Assessment & Plan    1.  Acute on chronic diastolic congestive heart failure: Normal LV function by echo in October 2019.  He previously did well on torsemide 20 mg on Mondays, Wednesdays, and Fridays, but since September 2020, weight has been trending higher.  Historically, he is responded well to short bursts of a higher dose of diuretic therapy but quickly becomes prerenal.  His preference is to avoid taking diuretics on as many days as possible.  In order to simplify things as he and his wife are trying to manage his diuretics at their assisted living, I will go back to torsemide 20 mg on Mondays, Wednesdays, and Fridays.  I am going to add metolazone 2.5 mg on Mondays, Wednesdays, and Fridays.  I hope that this will be able to achieve our goals of getting on his abdominal edema and improving his symptoms without having to have him take diuretics every day.  I will see him back next Thursday with a basic metabolic panel at that time.  2.  Essential hypertension: Blood pressure higher today than he has been trending previously.  We will see where he trends at follow-up following diuresis.  3.  Hyperlipidemia: He remains on Pravachol therapy.  LDL was 30 in April 2020 with normal LFTs in October.  4.  Persistent left pleural effusion: Previous evaluated by pulmonology felt to be stable.  He does have diminished breath sounds on examination.  Reevaluate following diuresis.  5.  Coronary artery disease: No chest pain.  Continue beta-blocker, nitrate, statin, and aspirin.  6.  Stage III chronic kidney disease: Creatinine stable at 1.71 on torsemide 20 mg 3-4 times per week on December 30.  I will follow-up a basic metabolic panel when I see him next week in the setting of the addition of metolazone.  7.  Chronic low back pain: Stable on tramadol 3  times per day.  8.  Disposition: Follow-up basic metabolic panel when I see him back next Thursday.  Murray Hodgkins, NP 03/03/2019, 8:57 AM

## 2019-03-09 ENCOUNTER — Encounter: Payer: Self-pay | Admitting: Nurse Practitioner

## 2019-03-09 ENCOUNTER — Ambulatory Visit (INDEPENDENT_AMBULATORY_CARE_PROVIDER_SITE_OTHER): Payer: Medicare Other | Admitting: Nurse Practitioner

## 2019-03-09 ENCOUNTER — Other Ambulatory Visit: Payer: Self-pay

## 2019-03-09 VITALS — BP 107/57 | HR 95 | Ht 70.0 in | Wt 209.2 lb

## 2019-03-09 DIAGNOSIS — N183 Chronic kidney disease, stage 3 unspecified: Secondary | ICD-10-CM

## 2019-03-09 DIAGNOSIS — I5033 Acute on chronic diastolic (congestive) heart failure: Secondary | ICD-10-CM | POA: Diagnosis not present

## 2019-03-09 DIAGNOSIS — I2510 Atherosclerotic heart disease of native coronary artery without angina pectoris: Secondary | ICD-10-CM | POA: Diagnosis not present

## 2019-03-09 MED ORDER — TORSEMIDE 20 MG PO TABS: 20.0000 mg | ORAL_TABLET | Freq: Every day | ORAL | 3 refills | Status: DC

## 2019-03-09 NOTE — Patient Instructions (Signed)
Medication Instructions:   You Torsemide 20 mg daily. *If you need a refill on your cardiac medications before your next appointment, please call your pharmacy*  Lab Work: BMET today If you have labs (blood work) drawn today and your tests are completely normal, you will receive your results only by: Marland Kitchen MyChart Message (if you have MyChart) OR . A paper copy in the mail If you have any lab test that is abnormal or we need to change your treatment, we will call you to review the results.  Testing/Procedures: None ordered  Follow-Up: At [facility], you and your health needs are our priority.  As part of our continuing mission to provide you with exceptional heart care, we have created designated Provider Care Teams.  These Care Teams include your primary Cardiologist (physician) and Advanced Practice Providers (APPs -  Physician Assistants and Nurse Practitioners) who all work together to provide you with the care you need, when you need it.  Your next appointment:   1 week(s)  The format for your next appointment:   In Person  Provider:   Murray Hodgkins, NP  Other Instructions NA

## 2019-03-09 NOTE — Progress Notes (Signed)
Office Visit    Patient Name: [name] Date of Encounter: 03/09/2019  Primary Care Provider:  System, Pcp Not In Primary Cardiologist:  Brandon Sacramento, MD  Chief Complaint    86 year old male with a history of [name], hypertension, hyperlipidemia, chronic back pain, right lower extremity cellulitis (resolved), and HFpEF, presents for follow-up related to volume overload after diuretic adjustment.  Past Medical History    Past Medical History:  Diagnosis Date  . (HFpEF) heart failure with preserved ejection fraction ([name])    a. 11/2017 Echo: EF 60-65%, no rwma, GR1 DD. Nl RV fxn.  . Anginal pain ([name])    c/o heaviness a few years ago.  stent placed and all resolved  . Anxiety   . BPH (benign prostatic hypertrophy)   . Carotid stenosis    a. 05/2015 Carotid U/S: mild nonobs atherosclerosis. No need for f/u.  Marland Kitchen Chronic back pain   . Chronic Left Pleural Effusion    a. 11/2017 Mod by CXR.  Marland Kitchen Coronary artery disease    a. 06/2013 Cath/PCI: LAD 50-70p (FFR 0.82-->PCI w/ 3.5x24 Promus DES), RCA subtotally occluded w/ L->R collats, LCX 30ost.  . Dyslipidemia   . GERD (gastroesophageal reflux disease)   . Hx of [name]   . Hyperlipidemia   . Hypertension   . Inguinal hernia   . Lightheadedness    a. chronic, somewhat positional.  . Lymphoblastic lymphoma ([name]) 1998   turned out to be something else in same family but not a problem  . Major depression   . Sleep apnea    will not use cpap   Past Surgical History:  Procedure Laterality Date  . ANTERIOR APPROACH HEMI HIP ARTHROPLASTY Right 04/16/2016   Procedure: ANTERIOR APPROACH HEMI HIP ARTHROPLASTY;  Surgeon: Brandon Knows, MD;  Location: [facility] ORS;  Service: Orthopedics;  Laterality: Right;  . APPENDECTOMY     86 years old  . arm fracture Right 2008   rod in arm  . CARDIAC CATHETERIZATION  2002  . CARDIAC CATHETERIZATION  06/2013   stent placed  . COLONOSCOPY    . EYE SURGERY Bilateral 2008   cataract extraction    . FEMUR FRACTURE SURGERY Left    with rod  . HEMORROIDECTOMY     75 years ago  . HIP FRACTURE SURGERY    . JOINT REPLACEMENT Bilateral 03/2016   right partial knee replacement, bilateral THR  . KYPHOPLASTY N/A 12/30/2016   Procedure: [id number];  Surgeon: Brandon Knows, MD;  Location: [facility] ORS;  Service: Orthopedics;  Laterality: N/A;    Allergies  Allergies  Allergen Reactions  . Cyclobenzaprine Other (See Comments)    Hallucination. Mental instability.  DO NOT GIVE ANY MUSCLE RELAXANTS  . Other Other (See Comments)  . Sulfamethoxazole Other (See Comments)  . Baclofen Other (See Comments)    Confusion and weakness  . Nsaids     Avoids due to liver.  . Sulfamethoxazole-Trimethoprim Nausea Only and Other (See Comments)    Patient unaware of this as an allergy  . Tolmetin Other (See Comments)    Avoids due to liver.it is an nsaid    History of Present Illness    86 year old male with above complex past medical history including [name] status post PCI drug-eluting stent placement to the LAD in May 2015.  He has a known subtotal occlusion of the RCA mild disease in left circumflex.  Other history includes hypertension, hyperlipidemia, chronic positional lightheadedness, malignant lymphoplasmacytic lymphoma, GERD, chronic back pain  with a history of vertebral fracture status post T10 kyphoplasty, and HFpEF.  Historically, Mr. [name] present issues with lower extremity edema and increasing abdominal girth requiring adjustment in diuretic therapy, compounded by orthostatic and positional lightheadedness.  He previously did well on torsemide 20 mg on Mondays, Wednesdays, and Fridays but at some point came off of it with resultant weight gain.  Torsemide dosing has historically been limited by rising creatinine, along with patient's wishes to avoid daily diuretic as it resulted in incontinence.  This is his third visit since December 30 in the setting of ongoing volume excess and weight  gain with associated dyspnea.  Previous dry weight was typically in the mid 190s however, over the past 18 months, his dry weight rose to the low 200s.  At his last visit on January 15, his weight was up to 215.8 pounds on our scale.  He was volume overloaded and after discussing his preferences with he and his wife, we decided to go with torsemide 20 mg and metolazone 2.5 mg on Mondays, Wednesdays, and Fridays.  On that regimen, he has noted some improvement in abdominal girth and dyspnea.  He was able to walk from his apartment to the bus today with dyspnea however, he did not have to stop walking to catch his breath which is an improvement.  On his home scale, his weight was 207 yesterday morning which was down 2 to 3 pounds over the span of the past week.  He is still bothered by abdominal bloating but denies PND, orthopnea, chest pain, palpitations, dizziness, syncope, lower extremity edema, or early satiety.  He and his wife are very careful with salt intake.  Home Medications    Prior to Admission medications   Medication Sig Start Date End Date Taking? Authorizing Provider  acetaminophen (TYLENOL) 500 MG tablet Take 2 tablets (1,000 mg total) by mouth every 6 (six) hours. 04/17/16  Yes Brandon Flock, MD  acidophilus (RISAQUAD) CAPS capsule Take 1 capsule by mouth daily.   Yes [provider]  aspirin 81 MG EC tablet Take 81 mg by mouth daily. Swallow whole.   Yes [provider]  Cholecalciferol (VITAMIN D3) 5000 units CAPS Take by mouth.   Yes [provider]  clonazePAM (KLONOPIN) 1 MG tablet Take 1 mg by mouth 2 (two) times daily as needed for anxiety.   Yes [provider]  diclofenac sodium (VOLTAREN) 1 % GEL Apply 1 application topically as directed. 06/21/18  Yes [provider]  docusate sodium (COLACE) 100 MG capsule Take 100 mg by mouth 2 (two) times daily.   Yes [provider]  doxazosin (CARDURA) 1 MG tablet Take 1 tablet (1 mg  total) by mouth daily. 05/28/17  Yes [name], Satira Anis, MD  escitalopram (LEXAPRO) 10 MG tablet Take 10 mg by mouth daily. 10/21/18  Yes [provider]  gabapentin (NEURONTIN) 300 MG capsule Take 300 mg by mouth at bedtime.  11/18/18  Yes [provider]  guaiFENesin (ROBITUSSIN) 100 MG/5ML liquid Take 200 mg by mouth 3 (three) times daily as needed for cough.   Yes [provider]  hydrocortisone 2.5 % cream  12/08/18  Yes [provider]  isosorbide mononitrate (IMDUR) 30 MG 24 hr tablet Take 1 tablet (30 mg total) by mouth daily. 02/15/17  Yes Roselee Nova, MD  ketoconazole (NIZORAL) 2 % cream  12/08/18  Yes [provider]  loperamide (IMODIUM) 2 MG capsule Take 2 mg by mouth as  needed for diarrhea or loose stools.   Yes [provider]  metolazone (ZAROXOLYN) 2.5 MG tablet Take 1 tablet (2.5 mg) by mouth once a day on Mondays/ Wednesdays/ & Fridays 03/03/19  Yes Theora Gianotti, NP  metoprolol tartrate (LOPRESSOR) 25 MG tablet Take 12.5 mg by mouth 2 (two) times daily.    Yes [provider]  Multiple Vitamins-Minerals (PRESERVISION/LUTEIN PO) Take by mouth 2 (two) times daily.   Yes [provider]  nitroGLYCERIN (NITROSTAT) 0.4 MG SL tablet Place 0.4 mg under the tongue every 5 (five) minutes as needed for chest pain.   Yes [provider]  pantoprazole (PROTONIX) 40 MG tablet Take 1 tablet (40 mg total) by mouth daily. 03/16/17  Yes Roselee Nova, MD  polyethylene glycol [facility] / GLYCOLAX) packet Take 17 g by mouth daily.   Yes [provider]  pravastatin (PRAVACHOL) 40 MG tablet Take 1 tablet (40 mg total) by mouth daily. Patient taking differently: Take 40 mg by mouth 3 (three) times a week.  02/15/17  Yes Roselee Nova, MD  senna (SENOKOT) 8.6 MG TABS tablet Take 1 tablet by mouth daily.   Yes [provider]  torsemide (DEMADEX) 20 MG tablet Take 1 tablet (20 mg total) by  mouth daily. Take 1 tablet (20 mg) by mouth once a day on Mondays/ Wednesdays/ & Fridays 03/09/19  Yes Theora Gianotti, NP  traMADol (ULTRAM) 50 MG tablet Take 1 tablet (50 mg total) by mouth every 8 (eight) hours as needed. Patient to carry to his room Patient taking differently: Take 50 mg by mouth every 6 (six) hours as needed. Patient to carry to his room 09/29/18  Yes Triplett, Cari B, FNP    Review of Systems    Ongoing dyspnea on exertion though slightly improved.  Also ongoing abdominal bloating, which is also slightly improved.  He denies chest pain, palpitations, PND, orthopnea, dizziness, syncope, or early satiety.  All other systems reviewed and are otherwise negative except as noted above.  Physical Exam    VS:  BP (!) 107/57 (BP Location: Left Arm, Patient Position: Sitting, Cuff Size: Normal)   Pulse 95   Ht 5\' 10"  (1.778 m)   Wt 209 lb 4 oz (94.9 kg)   SpO2 95%   BMI 30.02 kg/m  , BMI Body mass index is 30.02 kg/m. GEN: Well nourished, well developed, in no acute distress. HEENT: normal. Neck: Supple, JVP approximately 12 cm, no carotid bruits, or masses. Cardiac: RRR, no murmurs, rubs, or gallops. No clubbing, cyanosis, edema.  Radials/PT 2+ and equal bilaterally.  Respiratory:  Respirations regular and unlabored, diminished breath sounds bilaterally. GI: Obese, protuberant, softer than last week.  Nontender.  BS + x 4. MS: no deformity or atrophy. Skin: warm and dry, no rash. Neuro:  Strength and sensation are intact. Psych: Normal affect.  Accessory Clinical Findings    ECG personally reviewed by me today -regular sinus rhythm, 95, first-degree AV block, leftward axis, LVH- no acute changes.  Lab Results  Component Value Date   WBC 8.1 12/07/2018   HGB 12.3 (L) 12/07/2018   HCT 36.0 (L) 12/07/2018   MCV 86 12/07/2018   PLT 174 12/07/2018   Lab Results  Component Value Date   CREATININE 1.71 (H) 02/15/2019   BUN 30 02/15/2019   NA 139 02/15/2019    K 4.5 02/15/2019   CL 102 02/15/2019   CO2 23 02/15/2019   Lab Results  Component  Value Date   ALT 14 12/07/2018   AST 23 12/07/2018   ALKPHOS 111 12/07/2018   BILITOT 0.5 12/07/2018   Lab Results  Component Value Date   CHOL 68 02/19/2016   HDL 20 (L) 02/19/2016   LDLCALC 23 02/19/2016   TRIG 123 02/19/2016   CHOLHDL 3.4 02/19/2016    Lab Results  Component Value Date   HGBA1C 6.0 08/10/2016    Assessment & Plan    1.  Acute on chronic diastolic congestive heart failure: Normal LV function by echo in October 2019.  He previously did well on torsemide 20 mg on Mondays, Wednesdays, and Fridays, but since September 2020, weight has been trending higher.  At his goal has been to avoid daily diuretic, at his last visit I added metolazone 2.5 mg to torsemide 20 mg on Mondays, Wednesdays, and Fridays only.  His weight is down slightly based on his home weights (he is down 6 pounds on our scale), and he has had some improvement in abdominal girth and dyspnea, though he is not yet back to baseline.  I am going to follow-up a basic metabolic panel today.  I have asked him to take torsemide 20 mg daily and continue taking metolazone 2.5 mg on Mondays, Wednesdays, and Fridays.  I will see him back in 1 week.  2.  Essential hypertension: Stable.  3.  Hyperlipidemia: Remains on Pravachol with an LDL of 30 in April 2020 and normal LFTs in October.  4.  Persistent left pleural effusion: Previously evaluated by pulmonology and felt to be stable.  He continues to have diminished breath sounds.  Will see how he does with additional diuresis over the next week and consider follow-up chest x-ray at next visit.  5.  Coronary artery disease: No chest pain.  Continue beta-blocker, nitrate, statin, and aspirin.  6.  Stage III chronic kidney disease: Follow-up basic metabolic panel today.  7.  Disposition: Follow-up basic metabolic panel today.  I will see him back in 1 week.  Murray Hodgkins,  NP 03/09/2019, 11:45 AM

## 2019-03-10 ENCOUNTER — Other Ambulatory Visit: Payer: Self-pay | Admitting: Nurse Practitioner

## 2019-03-10 ENCOUNTER — Ambulatory Visit
Admission: RE | Admit: 2019-03-10 | Discharge: 2019-03-10 | Disposition: A | Discharge status: Home / Self Care | Payer: Medicare Other | Source: Ambulatory Visit | Attending: Nurse Practitioner | Admitting: Nurse Practitioner

## 2019-03-10 ENCOUNTER — Telehealth: Payer: Self-pay

## 2019-03-10 DIAGNOSIS — J90 Pleural effusion, not elsewhere classified: Secondary | ICD-10-CM

## 2019-03-10 LAB — BASIC METABOLIC PANEL
BUN/Creatinine Ratio: 21 (ref 10–24)
BUN: 51 mg/dL — ABNORMAL HIGH (ref 10–36)
CO2: 28 mmol/L (ref 20–29)
Calcium: 10.1 mg/dL (ref 8.6–10.2)
Chloride: 91 mmol/L — ABNORMAL LOW (ref 96–106)
Creatinine, Ser: 2.39 mg/dL — ABNORMAL HIGH (ref 0.76–1.27)
GFR calc Af Amer: 26 mL/min/{1.73_m2} — ABNORMAL LOW (ref >59)
GFR calc non Af Amer: 23 mL/min/{1.73_m2} — ABNORMAL LOW (ref >59)
Glucose: 142 mg/dL — ABNORMAL HIGH (ref 65–99)
Potassium: 3.8 mmol/L (ref 3.5–5.2)
Sodium: 137 mmol/L (ref 134–144)

## 2019-03-10 NOTE — Telephone Encounter (Signed)
Duplicate. See other phone note from today.

## 2019-03-10 NOTE — Addendum Note (Signed)
Addended by: Murray Hodgkins R on: 03/10/2019 11:20 AM   Modules accepted: Orders

## 2019-03-10 NOTE — Telephone Encounter (Signed)
With rise in creatinine, I suspect that [name] is intravascularly depleted, and dyspnea at this point may be driven more by pleural effusions.   I'd like for him to have CXR/decubitus films, which I have ordered.  If he can get today, great, but as he has been stable, if transportation is an issue, he can go on Monday.

## 2019-03-10 NOTE — Telephone Encounter (Signed)
"  With rise in creatinine, I suspect that [name] is intravascularly depleted, and dyspnea at this point may be driven more by pleural effusions.   I'd like for him to have CXR/decubitus films, which I have ordered.  If he can get today, great, but as he has been stable, if transportation is an issue, he can go on Monday."  Received above message from Brandon Bayley, [name].   Made call to [name].  She is agreeable to POC. She will call the office back to confirm that she will be able to get a ride from facility for [name] [facility] [place] chest x ray.

## 2019-03-10 NOTE — Telephone Encounter (Signed)
Call to Mrs. Hiebert to go over results.   She verbalized understanding and had no further questions at this time.   No new orders.   Advised pt to call for any further questions or concerns.

## 2019-03-10 NOTE — Telephone Encounter (Signed)
-----   Message from Theora Gianotti, NP sent at 03/10/2019  6:57 AM EST ----- Kidney function looks worse on metolazone.  Stop metolazone. Cont torsemide 20mg  daily (we changed to this yesterday). F/u next week as planned.

## 2019-03-10 NOTE — Telephone Encounter (Signed)
-----   Message from Theora Gianotti, NP sent at 03/10/2019  2:18 PM EST ----- Stable L pleural effusion. No role for intervention. Reassuring.

## 2019-03-10 NOTE — Telephone Encounter (Signed)
CXR reviewed by Ignacia Bayley, NP. "Stable left pleural effusion. No role for intervention. Reassuring." Plan to follow up as previously scheduled.

## 2019-03-10 NOTE — Telephone Encounter (Signed)
Call to patient to discuss labs. Spoke with wife [name].   She verbalized understanding and we confirmed appt for next week. She will take metolazone out of his medicine box.   I advised her to avoid salt as much as possible.   Advised pt to call for any further questions or concerns.

## 2019-03-13 NOTE — Telephone Encounter (Signed)
Delayed charting:   I spoke to Ms. Crass on Friday afternoon to review results.   No further questions or orders at that time.   Advised pt to call for any further questions or concerns.

## 2019-03-16 ENCOUNTER — Other Ambulatory Visit: Payer: Self-pay

## 2019-03-16 ENCOUNTER — Encounter: Payer: Self-pay | Admitting: Nurse Practitioner

## 2019-03-16 ENCOUNTER — Telehealth: Payer: Self-pay | Admitting: Nurse Practitioner

## 2019-03-16 ENCOUNTER — Telehealth (INDEPENDENT_AMBULATORY_CARE_PROVIDER_SITE_OTHER): Payer: Medicare Other | Admitting: Nurse Practitioner

## 2019-03-16 VITALS — Ht 70.0 in | Wt 209.0 lb

## 2019-03-16 DIAGNOSIS — I5033 Acute on chronic diastolic (congestive) heart failure: Secondary | ICD-10-CM | POA: Diagnosis not present

## 2019-03-16 DIAGNOSIS — J90 Pleural effusion, not elsewhere classified: Secondary | ICD-10-CM

## 2019-03-16 DIAGNOSIS — N1832 Chronic kidney disease, stage 3b: Secondary | ICD-10-CM | POA: Diagnosis not present

## 2019-03-16 NOTE — Telephone Encounter (Signed)
Patients wife calling back in to clarify labs. Advised that they were faxed to number provided by [facility] and we should get results tomorrow from them. She verbalized understanding with no further questions at this time.

## 2019-03-16 NOTE — Progress Notes (Signed)
Virtual Visit via Telephone Note   This visit type was conducted due to national recommendations for restrictions regarding the COVID-19 Pandemic (e.g. social distancing) in an effort to limit this patient's exposure and mitigate transmission in our community.  Due to his co-morbid illnesses, this patient is at least at moderate risk for complications without adequate follow up.  This format is felt to be most appropriate for this patient at this time.  The patient did not have access to video technology/had technical difficulties with video requiring transitioning to audio format only (telephone).  All issues noted in this document were discussed and addressed.  No physical exam could be performed with this format.  Please refer to the patient's chart for his  consent to telehealth for [facility]. Evaluation Performed:  Follow-up visit  This visit type was conducted due to national recommendations for restrictions regarding the COVID-19 Pandemic (e.g. social distancing).  This format is felt to be most appropriate for this patient at this time.  All issues noted in this document were discussed and addressed.  No physical exam was performed (except for noted visual exam findings with Video Visits).  Please refer to the patient's chart (MyChart message for video visits and phone note for telephone visits) for the patient's consent to telehealth for [facility]. _____________   Date:  03/16/2019   Patient ID:  [name], DOB 07-18-1923, MRN [id number] Patient Location:  Home Provider location:   Office  Primary Care Provider:  System, Pcp Not In Primary Cardiologist:  Kathlyn Sacramento, MD  Chief Complaint    86 year old male with a history of [name], hypertension, hyperlipidemia, chronic back pain, and HFpEF, who presents for telephonic follow-up related to dyspnea and heart failure.  Past Medical History    Past Medical History:  Diagnosis Date  . (HFpEF) heart failure with preserved  ejection fraction ([place])    a. 11/2017 Echo: EF 60-65%, no rwma, GR1 DD. Nl RV fxn.  . Anginal pain ([place])    c/o heaviness a few years ago.  stent placed and all resolved  . Anxiety   . BPH (benign prostatic hypertrophy)   . Carotid stenosis    a. 05/2015 Carotid U/S: mild nonobs atherosclerosis. No need for f/u.  Marland Kitchen Chronic back pain   . Chronic Left Pleural Effusion    a. 11/2017 Mod by CXR.  Marland Kitchen Coronary artery disease    a. 06/2013 Cath/PCI: LAD 50-70p (FFR 0.82-->PCI w/ 3.5x24 Promus DES), RCA subtotally occluded w/ L->R collats, LCX 30ost.  . Dyslipidemia   . GERD (gastroesophageal reflux disease)   . Hx of [name]   . Hyperlipidemia   . Hypertension   . Inguinal hernia   . Lightheadedness    a. chronic, somewhat positional.  . 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 Major depression   . Sleep apnea    will not use cpap   Past Surgical History:  Procedure Laterality Date  . ANTERIOR APPROACH HEMI HIP ARTHROPLASTY Right 04/16/2016   Procedure: ANTERIOR APPROACH HEMI HIP ARTHROPLASTY;  Surgeon: Hessie Knows, MD;  Location: [facility] ORS;  Service: Orthopedics;  Laterality: Right;  . APPENDECTOMY     86 years old  . arm fracture Right 2008   rod in arm  . CARDIAC CATHETERIZATION  2002  . CARDIAC CATHETERIZATION  06/2013   stent placed  . COLONOSCOPY    . EYE SURGERY Bilateral 2008  cataract extraction  . FEMUR FRACTURE SURGERY Left    with rod  . HEMORROIDECTOMY     75 years ago  . HIP FRACTURE SURGERY    . JOINT REPLACEMENT Bilateral 03/2016   right partial knee replacement, bilateral THR  . KYPHOPLASTY N/A 12/30/2016   Procedure: [id number];  Surgeon: Hessie Knows, MD;  Location: [facility] ORS;  Service: Orthopedics;  Laterality: N/A;    Allergies  Allergies  Allergen Reactions  . Cyclobenzaprine Other (See Comments)    Hallucination. Mental instability.  DO NOT GIVE ANY MUSCLE RELAXANTS  . Other  Other (See Comments)  . Sulfamethoxazole Other (See Comments)  . Baclofen Other (See Comments)    Confusion and weakness  . Nsaids     Avoids due to liver.  . Sulfamethoxazole-Trimethoprim Nausea Only and Other (See Comments)    Patient unaware of this as an allergy  . Tolmetin Other (See Comments)    Avoids due to liver.it is an nsaid    History of Present Illness    [name] is a 86 y.o. male who presents via audio/video conferencing for a telehealth visit today.  He was originally scheduled to come to the office however, a staff member at his assisted living facility has tested positive for COVID-19 in all residents were placed in quarantine.  As previously noted, he has a history of [name] status post PCI and drug-eluting stent placement to the LAD in May 2015.  He has a known subtotal occlusion of the right coronary artery with mild disease in the left circumflex.  Other history includes hypertension, hyperlipidemia, chronic positional lightheadedness, malignant lymphoplasmacytic lymphoma, GERD, chronic back pain with a history of vertebral fracture status post T10 kyphoplasty, and HFpEF.  Historically, he has had issues with lower extremity edema and increasing abdominal girth requiring adjustment in diuretic therapy, compounded by orthostatic and positional lightheadedness as well as chronic kidney disease.  Since the fall 2020, we have been adjusting his torsemide in the setting of increase in dry weight from the mid to high 190s to 205-210 pounds.  At his January 15 visit, I added metolazone 2.5 mg to be taken with torsemide 20 mg on Mondays, Wednesdays, and Fridays only.  When I saw him January 21, he did note about a 2-3 pound weight loss by his home scale with some improvement in dyspnea.  Unfortunately, BUN and creatinine rose to 51 and 2.39.  I had him stop metolazone and he was instead to continue on torsemide 20 mg daily.  In the setting of what appeared to be intravascular depletion,  with significantly diminished breath sounds on exam, I sent him for follow-up chest x-ray which showed a stable, chronic, loculated left pleural effusion.  No evidence of CHF on chest x-ray.  He and his wife are on speaker phone today.  Dyspnea is stable and similar to his last visit.  He has mostly noted good urine output.  No recent change in abdominal girth.  No PND, orthopnea, dizziness, syncope, chest pain, edema, or early satiety.  His weight has varied between 207 and 209 pounds.  The patient does not have symptoms concerning for COVID-19 infection (fever, chills, cough, or new shortness of breath).   Home Medications    Prior to Admission medications   Medication Sig Start Date End Date Taking? Authorizing Provider  acetaminophen (TYLENOL) 500 MG tablet Take 2 tablets (1,000 mg total) by mouth every 6 (six) hours. 04/17/16  Yes Dustin Flock, MD  acidophilus (RISAQUAD) CAPS  capsule Take 1 capsule by mouth daily.   Yes [provider]  aspirin 81 MG EC tablet Take 81 mg by mouth daily. Swallow whole.   Yes [provider]  Cholecalciferol (VITAMIN D3) 5000 units CAPS Take by mouth.   Yes [provider]  clonazePAM (KLONOPIN) 1 MG tablet Take 1 mg by mouth 2 (two) times daily as needed for anxiety.   Yes [provider]  diclofenac sodium (VOLTAREN) 1 % GEL Apply 1 application topically as directed. 06/21/18  Yes [provider]  docusate sodium (COLACE) 100 MG capsule Take 100 mg by mouth 2 (two) times daily.   Yes [provider]  doxazosin (CARDURA) 1 MG tablet Take 1 tablet (1 mg total) by mouth daily. 05/28/17  Yes Lada, Satira Anis, MD  escitalopram (LEXAPRO) 10 MG tablet Take 10 mg by mouth daily. 10/21/18  Yes [provider]  gabapentin (NEURONTIN) 300 MG capsule Take 300 mg by mouth at bedtime.  11/18/18  Yes [provider]  guaiFENesin (ROBITUSSIN) 100 MG/5ML liquid Take 200 mg by mouth 3 (three) times daily as  needed for cough.   Yes [provider]  hydrocortisone 2.5 % cream  12/08/18  Yes [provider]  isosorbide mononitrate (IMDUR) 30 MG 24 hr tablet Take 1 tablet (30 mg total) by mouth daily. 02/15/17  Yes Roselee Nova, MD  ketoconazole (NIZORAL) 2 % cream  12/08/18  Yes [provider]  loperamide (IMODIUM) 2 MG capsule Take 2 mg by mouth as needed for diarrhea or loose stools.   Yes [provider]  metolazone (ZAROXOLYN) 2.5 MG tablet Take 1 tablet (2.5 mg) by mouth once a day on Mondays/ Wednesdays/ & Fridays 03/03/19  Yes Theora Gianotti, NP  metoprolol tartrate (LOPRESSOR) 25 MG tablet Take 12.5 mg by mouth 2 (two) times daily.    Yes [provider]  Multiple Vitamins-Minerals (PRESERVISION/LUTEIN PO) Take by mouth 2 (two) times daily.   Yes [provider]  nitroGLYCERIN (NITROSTAT) 0.4 MG SL tablet Place 0.4 mg under the tongue every 5 (five) minutes as needed for chest pain.   Yes [provider]  pantoprazole (PROTONIX) 40 MG tablet Take 1 tablet (40 mg total) by mouth daily. 03/16/17  Yes Roselee Nova, MD  polyethylene glycol [facility] / GLYCOLAX) packet Take 17 g by mouth daily.   Yes [provider]  pravastatin (PRAVACHOL) 40 MG tablet Take 1 tablet (40 mg total) by mouth daily. Patient taking differently: Take 40 mg by mouth 3 (three) times a week.  02/15/17  Yes Roselee Nova, MD  senna (SENOKOT) 8.6 MG TABS tablet Take 1 tablet by mouth daily.   Yes [provider]  torsemide (DEMADEX) 20 MG tablet Take 1 tablet (20 mg total) by mouth daily. Take 1 tablet (20 mg) by mouth once a day on Mondays/ Wednesdays/ & Fridays 03/09/19  Yes Theora Gianotti, NP  traMADol (ULTRAM) 50 MG tablet Take 1 tablet (50 mg total) by mouth every 8 (eight) hours as needed. Patient to carry to his room Patient taking differently: Take 50 mg by mouth every 6 (six) hours as needed. Patient to carry  to his room 09/29/18  Yes Triplett, Cari B, FNP    Review of Systems    Ongoing, stable dyspnea on exertion.  He denies chest pain, palpitations, PND, orthopnea, dizziness, syncope, edema, or early satiety.  All other systems reviewed and are otherwise negative except  as noted above.  Physical Exam    Vital Signs:  Ht 5\' 10"  (1.778 m)   Wt 209 lb (94.8 kg)   BMI 29.99 kg/m    Pleasant, no acute distress.  Unlabored speech.  Accessory Clinical Findings     Lab Results  Component Value Date   CREATININE 2.39 (H) 03/09/2019   BUN 51 (H) 03/09/2019   NA 137 03/09/2019   K 3.8 03/09/2019   CL 91 (L) 03/09/2019   CO2 28 03/09/2019    PA & LAT CXR 1.22.2021  IMPRESSION: Chronic loculated left pleural effusion. No significant change since 12/14/2017. _____________   CXR Decubitus 1.22.2021  IMPRESSION: Chronic loculated left pleural effusion. No layering in the decubitus position. No change since the chest CT dated 02/19/2016. _____________   Assessment & Plan    1.  Acute on chronic diastolic congestive heart failure: Normal LV function by echocardiogram in October 2019.  He is currently on torsemide 20 mg daily.  I did try him on metolazone 3 times a week briefly however, this resulted in prerenal azotemia with rising BUN/creatinine to 51/2.39.  We have reached out to his assisted living facility and they will be able to draw labs today.  I am encouraged by his recent chest x-ray which did not show any CHF or change in chronic, left pleural effusion.  As he tends to hold much of his fluid in his abdomen and his biggest complaint is usually an increase in abdominal girth, that an increase in diuretics does not necessarily change, he may benefit from an abdominal ultrasound to assess for ascites.  We have sent over orders for a complete metabolic panel, albumin, and CBC.  We will plan to see him back in approximately 2 weeks, assuming he is cleared from quarantine, and can consider  abdominal ultrasound at that time if appropriate.  2.  Persistent left pleural effusion: Recent reassuring chest x-ray showing chronic, stable, loculated left pleural effusion.  He was previously evaluated by pulmonology for this with recommendation for conservative therapy.  3.  Stage III chronic kidney disease: We have sent over orders for labs today.    4.  Coronary artery disease: No chest pain.  Continue beta-blocker, nitrate, statin, and aspirin.  5.  Hyperlipidemia: He remains on Pravachol with an LDL of 30 in April 2020 with normal LFTs in October 2020.  6.  Essential hypertension: Stable.  7.  Disposition: Follow-up lab work-orders sent to assisted living facility.  Follow-up in clinic in 2 weeks or sooner if necessary.  COVID-19 Education: The signs and symptoms of COVID-19 were discussed with the patient and how to seek care for testing (follow up with PCP or arrange E-visit).  The importance of social distancing was discussed today.  Patient Risk:   After full review of this patient's history and clinical status, I feel that he is at least moderate risk for cardiac complications at this time, thus necessitating a telehealth visit sooner than our first available in office visit.  Time:   Today, I have spent 15 minutes with the patient with telehealth technology discussing medical history, symptoms, and management plan.     Murray Hodgkins, NP 03/16/2019, 11:02 AM

## 2019-03-16 NOTE — Telephone Encounter (Signed)
Please advise.  Patient wife calling to discuss.

## 2019-03-16 NOTE — Telephone Encounter (Signed)
Patient wife wants to discuss POC and labs needed.

## 2019-03-16 NOTE — Patient Instructions (Signed)
Medication Instructions:  No changes  *If you need a refill on your cardiac medications before your next appointment, please call your pharmacy*  Lab Work: CMET, Albumin, CBC are ordered STAT and orders will be faxed to [facility] to obtain.  If you have labs (blood work) drawn today and your tests are completely normal, you will receive your results only by: Marland Kitchen MyChart Message (if you have MyChart) OR . A paper copy in the mail If you have any lab test that is abnormal or we need to change your treatment, we will call you to review the results.  Testing/Procedures: None  Follow-Up: At [facility], you and your health needs are our priority.  As part of our continuing mission to provide you with exceptional heart care, we have created designated Provider Care Teams.  These Care Teams include your primary Cardiologist (physician) and Advanced Practice Providers (APPs -  Physician Assistants and Nurse Practitioners) who all work together to provide you with the care you need, when you need it.  Your next appointment:   3 week(s)  The format for your next appointment:   In Person  Provider:   Kathlyn Sacramento, MD or Nelva Bush, MD

## 2019-03-27 ENCOUNTER — Inpatient Hospital Stay: Payer: Medicare Other

## 2019-03-27 ENCOUNTER — Encounter: Payer: Self-pay | Admitting: Emergency Medicine

## 2019-03-27 ENCOUNTER — Inpatient Hospital Stay
Admission: EM | Admit: 2019-03-27 | Discharge: 2019-03-31 | DRG: 177 | Disposition: A | Discharge status: Skilled Nursing Facility | Payer: Medicare Other | Attending: Internal Medicine | Admitting: Internal Medicine

## 2019-03-27 ENCOUNTER — Telehealth: Payer: Self-pay | Admitting: Emergency Medicine

## 2019-03-27 ENCOUNTER — Telehealth: Payer: Self-pay | Admitting: Cardiovascular Disease

## 2019-03-27 ENCOUNTER — Emergency Department: Payer: Medicare Other

## 2019-03-27 ENCOUNTER — Other Ambulatory Visit: Payer: Self-pay

## 2019-03-27 DIAGNOSIS — I130 Hypertensive heart and chronic kidney disease with heart failure and stage 1 through stage 4 chronic kidney disease, or unspecified chronic kidney disease: Secondary | ICD-10-CM | POA: Diagnosis present

## 2019-03-27 DIAGNOSIS — Y92099 Unspecified place in other non-institutional residence as the place of occurrence of the external cause: Secondary | ICD-10-CM | POA: Diagnosis not present

## 2019-03-27 DIAGNOSIS — R531 Weakness: Secondary | ICD-10-CM

## 2019-03-27 DIAGNOSIS — Z8572 Personal history of non-Hodgkin lymphomas: Secondary | ICD-10-CM | POA: Diagnosis not present

## 2019-03-27 DIAGNOSIS — I5032 Chronic diastolic (congestive) heart failure: Secondary | ICD-10-CM | POA: Diagnosis present

## 2019-03-27 DIAGNOSIS — R140 Abdominal distension (gaseous): Secondary | ICD-10-CM | POA: Diagnosis present

## 2019-03-27 DIAGNOSIS — G4730 Sleep apnea, unspecified: Secondary | ICD-10-CM | POA: Diagnosis present

## 2019-03-27 DIAGNOSIS — K219 Gastro-esophageal reflux disease without esophagitis: Secondary | ICD-10-CM | POA: Diagnosis present

## 2019-03-27 DIAGNOSIS — G8929 Other chronic pain: Secondary | ICD-10-CM | POA: Diagnosis present

## 2019-03-27 DIAGNOSIS — N179 Acute kidney failure, unspecified: Secondary | ICD-10-CM | POA: Diagnosis present

## 2019-03-27 DIAGNOSIS — Z66 Do not resuscitate: Secondary | ICD-10-CM | POA: Diagnosis present

## 2019-03-27 DIAGNOSIS — J1282 Pneumonia due to coronavirus disease 2019: Secondary | ICD-10-CM | POA: Diagnosis present

## 2019-03-27 DIAGNOSIS — Z23 Encounter for immunization: Secondary | ICD-10-CM

## 2019-03-27 DIAGNOSIS — E785 Hyperlipidemia, unspecified: Secondary | ICD-10-CM | POA: Diagnosis present

## 2019-03-27 DIAGNOSIS — F419 Anxiety disorder, unspecified: Secondary | ICD-10-CM | POA: Diagnosis present

## 2019-03-27 DIAGNOSIS — Z7189 Other specified counseling: Secondary | ICD-10-CM | POA: Diagnosis not present

## 2019-03-27 DIAGNOSIS — Z823 Family history of stroke: Secondary | ICD-10-CM | POA: Diagnosis not present

## 2019-03-27 DIAGNOSIS — M549 Dorsalgia, unspecified: Secondary | ICD-10-CM | POA: Diagnosis present

## 2019-03-27 DIAGNOSIS — N1831 Chronic kidney disease, stage 3a: Secondary | ICD-10-CM | POA: Diagnosis present

## 2019-03-27 DIAGNOSIS — L8990 Pressure ulcer of unspecified site, unspecified stage: Secondary | ICD-10-CM | POA: Insufficient documentation

## 2019-03-27 DIAGNOSIS — N400 Enlarged prostate without lower urinary tract symptoms: Secondary | ICD-10-CM | POA: Diagnosis present

## 2019-03-27 DIAGNOSIS — W19XXXA Unspecified fall, initial encounter: Secondary | ICD-10-CM | POA: Diagnosis present

## 2019-03-27 DIAGNOSIS — J9601 Acute respiratory failure with hypoxia: Secondary | ICD-10-CM | POA: Diagnosis present

## 2019-03-27 DIAGNOSIS — Z79899 Other long term (current) drug therapy: Secondary | ICD-10-CM

## 2019-03-27 DIAGNOSIS — S50311A Abrasion of right elbow, initial encounter: Secondary | ICD-10-CM | POA: Diagnosis present

## 2019-03-27 DIAGNOSIS — I5033 Acute on chronic diastolic (congestive) heart failure: Secondary | ICD-10-CM | POA: Insufficient documentation

## 2019-03-27 DIAGNOSIS — L89151 Pressure ulcer of sacral region, stage 1: Secondary | ICD-10-CM | POA: Diagnosis present

## 2019-03-27 DIAGNOSIS — S50312A Abrasion of left elbow, initial encounter: Secondary | ICD-10-CM | POA: Diagnosis present

## 2019-03-27 DIAGNOSIS — Z79891 Long term (current) use of opiate analgesic: Secondary | ICD-10-CM

## 2019-03-27 DIAGNOSIS — Z96641 Presence of right artificial hip joint: Secondary | ICD-10-CM | POA: Diagnosis present

## 2019-03-27 DIAGNOSIS — Z8249 Family history of ischemic heart disease and other diseases of the circulatory system: Secondary | ICD-10-CM

## 2019-03-27 DIAGNOSIS — F329 Major depressive disorder, single episode, unspecified: Secondary | ICD-10-CM | POA: Diagnosis present

## 2019-03-27 DIAGNOSIS — I10 Essential (primary) hypertension: Secondary | ICD-10-CM | POA: Diagnosis present

## 2019-03-27 DIAGNOSIS — U071 COVID-19: Principal | ICD-10-CM | POA: Diagnosis present

## 2019-03-27 DIAGNOSIS — Z833 Family history of diabetes mellitus: Secondary | ICD-10-CM

## 2019-03-27 DIAGNOSIS — I2510 Atherosclerotic heart disease of native coronary artery without angina pectoris: Secondary | ICD-10-CM | POA: Diagnosis present

## 2019-03-27 LAB — CBC WITH DIFFERENTIAL/PLATELET
Abs Immature Granulocytes: 0.12 10*3/uL — ABNORMAL HIGH (ref 0.00–0.07)
Basophils Absolute: 0 10*3/uL (ref 0.0–0.1)
Basophils Relative: 0 %
Eosinophils Absolute: 0 10*3/uL (ref 0.0–0.5)
Eosinophils Relative: 0 %
HCT: 39.2 % (ref 39.0–52.0)
Hemoglobin: 12.7 g/dL — ABNORMAL LOW (ref 13.0–17.0)
Immature Granulocytes: 1 %
Lymphocytes Relative: 11 %
Lymphs Abs: 1.1 10*3/uL (ref 0.7–4.0)
MCH: 29.3 pg (ref 26.0–34.0)
MCHC: 32.4 g/dL (ref 30.0–36.0)
MCV: 90.5 fL (ref 80.0–100.0)
Monocytes Absolute: 2 10*3/uL — ABNORMAL HIGH (ref 0.1–1.0)
Monocytes Relative: 19 %
Neutro Abs: 7.3 10*3/uL (ref 1.7–7.7)
Neutrophils Relative %: 69 %
Platelets: 155 10*3/uL (ref 150–400)
RBC: 4.33 MIL/uL (ref 4.22–5.81)
RDW: 14.6 % (ref 11.5–15.5)
WBC: 10.5 10*3/uL (ref 4.0–10.5)
nRBC: 0 % (ref 0.0–0.2)

## 2019-03-27 LAB — COMPREHENSIVE METABOLIC PANEL
ALT: 20 U/L (ref 0–44)
AST: 37 U/L (ref 15–41)
Albumin: 3.8 g/dL (ref 3.5–5.0)
Alkaline Phosphatase: 79 U/L (ref 38–126)
Anion gap: 16 — ABNORMAL HIGH (ref 5–15)
BUN: 72 mg/dL — ABNORMAL HIGH (ref 8–23)
CO2: 31 mmol/L (ref 22–32)
Calcium: 9.5 mg/dL (ref 8.9–10.3)
Chloride: 85 mmol/L — ABNORMAL LOW (ref 98–111)
Creatinine, Ser: 2.48 mg/dL — ABNORMAL HIGH (ref 0.61–1.24)
GFR calc Af Amer: 25 mL/min — ABNORMAL LOW (ref >60)
GFR calc non Af Amer: 22 mL/min — ABNORMAL LOW (ref >60)
Glucose, Bld: 148 mg/dL — ABNORMAL HIGH (ref 70–99)
Potassium: 3.2 mmol/L — ABNORMAL LOW (ref 3.5–5.1)
Sodium: 132 mmol/L — ABNORMAL LOW (ref 135–145)
Total Bilirubin: 0.9 mg/dL (ref 0.3–1.2)
Total Protein: 9.1 g/dL — ABNORMAL HIGH (ref 6.5–8.1)

## 2019-03-27 LAB — PROCALCITONIN: Procalcitonin: 0.28 ng/mL

## 2019-03-27 LAB — ABO/RH: ABO/RH(D): A NEG

## 2019-03-27 LAB — BRAIN NATRIURETIC PEPTIDE
B Natriuretic Peptide: 184 pg/mL — ABNORMAL HIGH (ref 0.0–100.0)
B Natriuretic Peptide: 160 pg/mL — ABNORMAL HIGH (ref 0.0–100.0)

## 2019-03-27 LAB — TRIGLYCERIDES: Triglycerides: 102 mg/dL (ref <150)

## 2019-03-27 LAB — C-REACTIVE PROTEIN
CRP: 10.1 mg/dL — ABNORMAL HIGH (ref <1.0)
CRP: 11.5 mg/dL — ABNORMAL HIGH (ref <1.0)

## 2019-03-27 LAB — POC SARS CORONAVIRUS 2 AG: SARS Coronavirus 2 Ag: POSITIVE — AB

## 2019-03-27 LAB — URINALYSIS, ROUTINE W REFLEX MICROSCOPIC
Bilirubin Urine: NEGATIVE
Glucose, UA: NEGATIVE mg/dL
Hgb urine dipstick: NEGATIVE
Ketones, ur: NEGATIVE mg/dL
Leukocytes,Ua: NEGATIVE
Nitrite: NEGATIVE
Protein, ur: NEGATIVE mg/dL
Specific Gravity, Urine: 1.015 (ref 1.005–1.030)
pH: 5 (ref 5.0–8.0)

## 2019-03-27 LAB — FIBRIN DERIVATIVES D-DIMER (ARMC ONLY): Fibrin derivatives D-dimer (ARMC): 1765 ng/mL (FEU) — ABNORMAL HIGH (ref 0.00–499.00)

## 2019-03-27 LAB — LACTIC ACID, PLASMA
Lactic Acid, Venous: 2.4 mmol/L (ref 0.5–1.9)
Lactic Acid, Venous: 1.6 mmol/L (ref 0.5–1.9)

## 2019-03-27 LAB — TYPE AND SCREEN
ABO/RH(D): A NEG
Antibody Screen: NEGATIVE

## 2019-03-27 LAB — TROPONIN I (HIGH SENSITIVITY)
Troponin I (High Sensitivity): 18 ng/L — ABNORMAL HIGH (ref <18)
Troponin I (High Sensitivity): 20 ng/L — ABNORMAL HIGH (ref <18)
Troponin I (High Sensitivity): 19 ng/L — ABNORMAL HIGH (ref <18)
Troponin I (High Sensitivity): 18 ng/L — ABNORMAL HIGH (ref <18)

## 2019-03-27 LAB — FERRITIN: Ferritin: 155 ng/mL (ref 24–336)

## 2019-03-27 LAB — T4, FREE: Free T4: 0.86 ng/dL (ref 0.61–1.12)

## 2019-03-27 LAB — FIBRINOGEN: Fibrinogen: 416 mg/dL (ref 210–475)

## 2019-03-27 LAB — LACTATE DEHYDROGENASE: LDH: 134 U/L (ref 98–192)

## 2019-03-27 LAB — PHOSPHORUS: Phosphorus: 4.1 mg/dL (ref 2.5–4.6)

## 2019-03-27 LAB — MAGNESIUM: Magnesium: 2.4 mg/dL (ref 1.7–2.4)

## 2019-03-27 LAB — TSH: TSH: 0.169 u[IU]/mL — ABNORMAL LOW (ref 0.350–4.500)

## 2019-03-27 MED ORDER — PANTOPRAZOLE SODIUM 40 MG PO TBEC: 40.0000 mg | DELAYED_RELEASE_TABLET | Freq: Every day | ORAL | Status: DC

## 2019-03-27 MED ORDER — METOLAZONE 2.5 MG PO TABS
2.5000 mg | ORAL_TABLET | ORAL | Status: DC
  Administered 2019-03-27: 2.5 mg via ORAL
  Filled 2019-03-27: qty 1

## 2019-03-27 MED ORDER — POLYETHYLENE GLYCOL 3350 17 G PO PACK: 17.0000 g | PACK | Freq: Every day | ORAL | Status: DC | PRN

## 2019-03-27 MED ORDER — DEXAMETHASONE SODIUM PHOSPHATE 10 MG/ML IJ SOLN
6.0000 mg | INTRAMUSCULAR | Status: DC
  Administered 2019-03-27 – 2019-03-31 (×5): 6 mg via INTRAVENOUS
  Filled 2019-03-27 (×5): qty 1

## 2019-03-27 MED ORDER — SODIUM CHLORIDE 0.9 % IV BOLUS
500.0000 mL | Freq: Once | INTRAVENOUS | Status: AC
  Administered 2019-03-27: 500 mL via INTRAVENOUS

## 2019-03-27 MED ORDER — ZINC SULFATE 220 (50 ZN) MG PO CAPS
220.0000 mg | ORAL_CAPSULE | Freq: Every day | ORAL | Status: DC
  Administered 2019-03-27 – 2019-03-31 (×5): 220 mg via ORAL
  Filled 2019-03-27 (×5): qty 1

## 2019-03-27 MED ORDER — TRAMADOL HCL 50 MG PO TABS
50.0000 mg | ORAL_TABLET | Freq: Three times a day (TID) | ORAL | Status: DC | PRN
  Administered 2019-03-28 – 2019-03-29 (×3): 50 mg via ORAL
  Filled 2019-03-27 (×3): qty 1

## 2019-03-27 MED ORDER — PRAVASTATIN SODIUM 40 MG PO TABS
40.0000 mg | ORAL_TABLET | Freq: Every day | ORAL | Status: DC
  Administered 2019-03-27 – 2019-03-30 (×4): 40 mg via ORAL
  Filled 2019-03-27 (×2): qty 1
  Filled 2019-03-27 (×3): qty 2
  Filled 2019-03-27 (×3): qty 1

## 2019-03-27 MED ORDER — TAB-A-VITE/IRON PO TABS
1.0000 | ORAL_TABLET | Freq: Every day | ORAL | Status: DC
  Administered 2019-03-27 – 2019-03-31 (×5): 1 via ORAL
  Filled 2019-03-27 (×5): qty 1

## 2019-03-27 MED ORDER — ALUM & MAG HYDROXIDE-SIMETH 200-200-20 MG/5ML PO SUSP
30.0000 mL | Freq: Four times a day (QID) | ORAL | Status: DC | PRN
  Administered 2019-03-29: 30 mL via ORAL
  Filled 2019-03-27: qty 30

## 2019-03-27 MED ORDER — ALBUTEROL SULFATE HFA 108 (90 BASE) MCG/ACT IN AERS
2.0000 | INHALATION_SPRAY | Freq: Four times a day (QID) | RESPIRATORY_TRACT | Status: DC
  Administered 2019-03-27 – 2019-03-31 (×15): 2 via RESPIRATORY_TRACT
  Filled 2019-03-27 (×2): qty 6.7

## 2019-03-27 MED ORDER — ESCITALOPRAM OXALATE 10 MG PO TABS
20.0000 mg | ORAL_TABLET | Freq: Every day | ORAL | Status: DC
  Administered 2019-03-27 – 2019-03-31 (×5): 20 mg via ORAL
  Filled 2019-03-27 (×5): qty 2

## 2019-03-27 MED ORDER — HEPARIN SODIUM (PORCINE) 5000 UNIT/ML IJ SOLN
5000.0000 [IU] | Freq: Three times a day (TID) | INTRAMUSCULAR | Status: DC
  Administered 2019-03-27 – 2019-03-31 (×12): 5000 [IU] via SUBCUTANEOUS
  Filled 2019-03-27 (×11): qty 1

## 2019-03-27 MED ORDER — RISAQUAD PO CAPS
1.0000 | ORAL_CAPSULE | Freq: Every day | ORAL | Status: DC
  Administered 2019-03-27 – 2019-03-31 (×5): 1 via ORAL
  Filled 2019-03-27 (×5): qty 1

## 2019-03-27 MED ORDER — LACTATED RINGERS IV SOLN: INTRAVENOUS | Status: AC

## 2019-03-27 MED ORDER — METOPROLOL TARTRATE 25 MG PO TABS
12.5000 mg | ORAL_TABLET | Freq: Two times a day (BID) | ORAL | Status: DC
  Administered 2019-03-27 – 2019-03-31 (×9): 12.5 mg via ORAL
  Filled 2019-03-27 (×9): qty 1

## 2019-03-27 MED ORDER — ONDANSETRON HCL 4 MG PO TABS: 4.0000 mg | ORAL_TABLET | Freq: Four times a day (QID) | ORAL | Status: DC | PRN

## 2019-03-27 MED ORDER — CLONAZEPAM 1 MG PO TABS
1.0000 mg | ORAL_TABLET | Freq: Two times a day (BID) | ORAL | Status: DC
  Administered 2019-03-27 – 2019-03-31 (×8): 1 mg via ORAL
  Filled 2019-03-27 (×8): qty 1

## 2019-03-27 MED ORDER — SODIUM CHLORIDE 0.9 % IV SOLN
100.0000 mg | Freq: Every day | INTRAVENOUS | Status: AC
  Administered 2019-03-28 – 2019-03-31 (×4): 100 mg via INTRAVENOUS
  Filled 2019-03-27 (×5): qty 20

## 2019-03-27 MED ORDER — ASCORBIC ACID 500 MG PO TABS
500.0000 mg | ORAL_TABLET | Freq: Every day | ORAL | Status: DC
  Administered 2019-03-27 – 2019-03-31 (×5): 500 mg via ORAL
  Filled 2019-03-27 (×5): qty 1

## 2019-03-27 MED ORDER — POTASSIUM CHLORIDE 10 MEQ/100ML IV SOLN
10.0000 meq | INTRAVENOUS | Status: AC
  Administered 2019-03-27: 10 meq via INTRAVENOUS
  Filled 2019-03-27: qty 100

## 2019-03-27 MED ORDER — ISOSORBIDE MONONITRATE ER 30 MG PO TB24
30.0000 mg | ORAL_TABLET | Freq: Every day | ORAL | Status: DC
  Administered 2019-03-27 – 2019-03-31 (×5): 30 mg via ORAL
  Filled 2019-03-27 (×5): qty 1

## 2019-03-27 MED ORDER — SODIUM CHLORIDE 0.9 % IV SOLN
200.0000 mg | Freq: Once | INTRAVENOUS | Status: AC
  Administered 2019-03-27: 200 mg via INTRAVENOUS
  Filled 2019-03-27: qty 200

## 2019-03-27 MED ORDER — BIOTENE DRY MOUTH MT LIQD
15.0000 mL | Freq: Two times a day (BID) | OROMUCOSAL | Status: DC | PRN
  Filled 2019-03-27: qty 15

## 2019-03-27 MED ORDER — SODIUM CHLORIDE 0.9 % IV SOLN: 100.0000 mg | Freq: Every day | INTRAVENOUS | Status: DC

## 2019-03-27 MED ORDER — GABAPENTIN 300 MG PO CAPS
300.0000 mg | ORAL_CAPSULE | Freq: Every day | ORAL | Status: DC
  Administered 2019-03-27 – 2019-03-30 (×4): 300 mg via ORAL
  Filled 2019-03-27 (×4): qty 1

## 2019-03-27 MED ORDER — MAGIC MOUTHWASH: 15.0000 mL | Freq: Four times a day (QID) | ORAL | Status: DC | PRN

## 2019-03-27 MED ORDER — DOXAZOSIN MESYLATE 1 MG PO TABS
1.0000 mg | ORAL_TABLET | Freq: Every day | ORAL | Status: DC
  Administered 2019-03-28 – 2019-03-31 (×4): 1 mg via ORAL
  Filled 2019-03-27 (×5): qty 1

## 2019-03-27 MED ORDER — PANTOPRAZOLE SODIUM 40 MG IV SOLR
40.0000 mg | INTRAVENOUS | Status: DC
  Administered 2019-03-27 – 2019-03-30 (×4): 40 mg via INTRAVENOUS
  Filled 2019-03-27 (×4): qty 40

## 2019-03-27 MED ORDER — VITAMIN D3 25 MCG (1000 UNIT) PO TABS
5000.0000 [IU] | ORAL_TABLET | Freq: Every day | ORAL | Status: DC
  Administered 2019-03-27 – 2019-03-31 (×5): 5000 [IU] via ORAL
  Filled 2019-03-27 (×10): qty 5

## 2019-03-27 MED ORDER — GUAIFENESIN 100 MG/5ML PO SOLN
200.0000 mg | Freq: Four times a day (QID) | ORAL | Status: DC | PRN
  Filled 2019-03-27: qty 10

## 2019-03-27 MED ORDER — DOCUSATE SODIUM 100 MG PO CAPS
100.0000 mg | ORAL_CAPSULE | Freq: Two times a day (BID) | ORAL | Status: DC
  Administered 2019-03-27 – 2019-03-31 (×8): 100 mg via ORAL
  Filled 2019-03-27 (×8): qty 1

## 2019-03-27 MED ORDER — TETANUS-DIPHTH-ACELL PERTUSSIS 5-2.5-18.5 LF-MCG/0.5 IM SUSP
0.5000 mL | Freq: Once | INTRAMUSCULAR | Status: AC
  Administered 2019-03-27: 0.5 mL via INTRAMUSCULAR
  Filled 2019-03-27: qty 0.5

## 2019-03-27 NOTE — H&P (Signed)
History and Physical    [name] SJ:[id number] DOB: 1924-02-02 DOA: 03/27/2019   PCP: System, Pcp Not In   Outpatient Specialists: Dr.Arida.  Patient coming from: SNF  Chief Complaint: Covid-19 - Infection.  HPI: [name] is a 86 y.o. male with medical history significant of HTN, [name],AKI on CKD. Pt seen in ed from SNF for fall and weakness,per son pt was on his way bathroom when he fell gave way. He also sob  X 3 days , and otherwise doing fair. Pt is also seeing cardiologist for his heart.  Pt sees dr. Fletcher Anon for his heart.  ED Course:  Blood pressure 121/72, pulse 74, temperature 98.8 F (37.1 C), temperature source Oral, resp. rate (!) 25, height 5\' 10"  (1.778 m), weight 94.8 kg, SpO2 99 %. Pt in ed is alert and is on nasal cannula and is alert and resting and comfortable.  Chest xray showed moderate sized left pleural effusion.  Review of Systems:  Unable to obtain - poor historian.   Past Medical History:  Diagnosis Date  . (HFpEF) heart failure with preserved ejection fraction ([place])    a. 11/2017 Echo: EF 60-65%, no rwma, GR1 DD. Nl RV fxn.  . Anginal pain ([place])    c/o heaviness a few years ago.  stent placed and all resolved  . Anxiety   . BPH (benign prostatic hypertrophy)   . Carotid stenosis    a. 05/2015 Carotid U/S: mild nonobs atherosclerosis. No need for f/u.  Marland Kitchen Chronic back pain   . Chronic Left Pleural Effusion    a. 11/2017 Mod by CXR.  Marland Kitchen Coronary artery disease    a. 06/2013 Cath/PCI: LAD 50-70p (FFR 0.82-->PCI w/ 3.5x24 Promus DES), RCA subtotally occluded w/ L->R collats, LCX 30ost.  . Dyslipidemia   . GERD (gastroesophageal reflux disease)   . Hx of [name]   . Hyperlipidemia   . Hypertension   . Inguinal hernia   . Lightheadedness    a. chronic, somewhat positional.  . 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 Major depression   . Sleep apnea    will not use cpap    Past  Surgical History:  Procedure Laterality Date  . ANTERIOR APPROACH HEMI HIP ARTHROPLASTY Right 04/16/2016   Procedure: ANTERIOR APPROACH HEMI HIP ARTHROPLASTY;  Surgeon: Hessie Knows, MD;  Location: [facility] ORS;  Service: Orthopedics;  Laterality: Right;  . APPENDECTOMY     86 years old  . arm fracture Right 2008   rod in arm  . CARDIAC CATHETERIZATION  2002  . CARDIAC CATHETERIZATION  06/2013   stent placed  . COLONOSCOPY    . EYE SURGERY Bilateral 2008   cataract extraction  . FEMUR FRACTURE SURGERY Left    with rod  . HEMORROIDECTOMY     75 years ago  . HIP FRACTURE SURGERY    . JOINT REPLACEMENT Bilateral 03/2016   right partial knee replacement, bilateral THR  . KYPHOPLASTY N/A 12/30/2016   Procedure: [id number];  Surgeon: Hessie Knows, MD;  Location: [facility] ORS;  Service: Orthopedics;  Laterality: N/A;     reports that he quit smoking about 65 years ago. His smoking use included cigarettes. He has a 14.00 pack-year smoking history. He has never used smokeless tobacco. He reports that he does not drink alcohol or use drugs.  Allergies  Allergen Reactions  . Cyclobenzaprine Other (See Comments)    Hallucination.  Mental instability.  DO NOT GIVE ANY MUSCLE RELAXANTS  . Other Other (See Comments)  . Sulfamethoxazole Other (See Comments)  . Baclofen Other (See Comments)    Confusion and weakness  . Nsaids     Avoids due to liver.  . Sulfamethoxazole-Trimethoprim Nausea Only and Other (See Comments)    Patient unaware of this as an allergy  . Tolmetin Other (See Comments)    Avoids due to liver.it is an nsaid    Family History  Problem Relation Age of Onset  . Heart disease Mother   . Stroke Mother   . Diabetes Father   . Dementia Father   . Stroke Sister   . Stroke Brother   . Stroke Sister   . Stroke Sister   . Stroke Brother   . Stroke Brother      Prior to Admission medications   Medication Sig Start Date End Date Taking? Authorizing Provider    acetaminophen (TYLENOL) 500 MG tablet Take 1,000 mg by mouth 3 (three) times daily.   Yes [provider]  acetaminophen (TYLENOL) 500 MG tablet Take 500 mg by mouth every 6 (six) hours as needed for mild pain or fever.   Yes [provider]  acidophilus (RISAQUAD) CAPS capsule Take 1 capsule by mouth daily.   Yes [provider]  alum & mag hydroxide-simeth ([place]) 200-200-20 MG/5ML suspension Take 30 mLs by mouth every 6 (six) hours as needed for indigestion or heartburn.   Yes [provider]  antiseptic oral rinse (BIOTENE) LIQD 15 mLs by Mouth Rinse route 2 (two) times daily as needed for dry mouth.   Yes [provider]  Cholecalciferol (VITAMIN D3) 5000 units CAPS Take 5,000 Units by mouth daily.    Yes [provider]  clonazePAM (KLONOPIN) 1 MG tablet Take 1 mg by mouth 2 (two) times daily.    Yes [provider]  diclofenac sodium (VOLTAREN) 1 % GEL Apply 2 g topically 2 (two) times daily as needed (lower back pain). (use when lidocaine patch is off)   Yes [provider]  docusate sodium (COLACE) 100 MG capsule Take 100 mg by mouth 2 (two) times daily.   Yes [provider]  doxazosin (CARDURA) 1 MG tablet Take 1 tablet (1 mg total) by mouth daily. Patient taking differently: Take 1 mg by mouth at bedtime.  05/28/17  Yes Lada, Satira Anis, MD  escitalopram (LEXAPRO) 20 MG tablet Take 20 mg by mouth daily.    Yes [provider]  gabapentin (NEURONTIN) 300 MG capsule Take 300 mg by mouth at bedtime.  11/18/18  Yes [provider]  guaiFENesin (ROBITUSSIN) 100 MG/5ML liquid Take 200 mg by mouth every 6 (six) hours as needed for cough.    Yes [provider]  hydrocortisone 2.5 % cream Apply 1 application topically every other day. (apply to scalp and alternate with ketoconazole) 12/08/18  Yes [provider]  isosorbide mononitrate (IMDUR) 30 MG 24 hr tablet Take 1 tablet (30 mg  total) by mouth daily. 02/15/17  Yes Roselee Nova, MD  ketoconazole (NIZORAL) 2 % cream Apply 1 application topically every other day. (apply to scalp and alternate with hydrocortisone) 12/08/18  Yes [provider]  Lidocaine (ASPERCREME LIDOCAINE) 4 % PTCH Apply 1 patch topically 2 (two) times daily.   Yes [provider]  loperamide (IMODIUM) 2 MG capsule Take 2 mg by mouth as needed for diarrhea or loose stools. (max 8 doses in  24 hours)   Yes [provider]  Lutein 10 MG TABS Take 10 mg by mouth 2 (two) times daily.   Yes [provider]  magic mouthwash SOLN Take 15 mLs by mouth 4 (four) times daily as needed for mouth pain.   Yes [provider]  magnesium hydroxide (MILK OF MAGNESIA) 400 MG/5ML suspension Take 30 mLs by mouth at bedtime as needed for mild constipation or moderate constipation.   Yes [provider]  Melatonin 5 MG TABS Take 5 mg by mouth at bedtime as needed (sleep).   Yes [provider]  metolazone (ZAROXOLYN) 2.5 MG tablet Take 1 tablet (2.5 mg) by mouth once a day on Mondays/ Wednesdays/ & Fridays Patient taking differently: Take 2.5 mg by mouth every Monday, Wednesday, and Friday.  03/03/19  Yes Theora Gianotti, NP  metoprolol tartrate (LOPRESSOR) 25 MG tablet Take 12.5 mg by mouth 2 (two) times daily.    Yes [provider]  Multiple Vitamins-Iron (MULTIVITAMINS WITH IRON) TABS tablet Take 1 tablet by mouth daily.   Yes [provider]  neomycin-bacitracin-polymyxin (NEOSPORIN) ointment Apply 1 application topically as needed for wound care.   Yes [provider]  nitroGLYCERIN (NITROSTAT) 0.4 MG SL tablet Place 0.4 mg under the tongue every 5 (five) minutes as needed for chest pain.   Yes [provider]  ondansetron (ZOFRAN) 4 MG tablet Take 4 mg by mouth every 6 (six) hours as needed for nausea or vomiting.   Yes [provider]  pantoprazole  (PROTONIX) 40 MG tablet Take 1 tablet (40 mg total) by mouth daily. 03/16/17  Yes Roselee Nova, MD  polyethylene glycol [facility] / GLYCOLAX) packet Take 17 g by mouth daily as needed.    Yes [provider]  pravastatin (PRAVACHOL) 40 MG tablet Take 40 mg by mouth at bedtime.   Yes [provider]  senna (SENOKOT) 8.6 MG TABS tablet Take 1 tablet by mouth at bedtime.    Yes [provider]  torsemide (DEMADEX) 20 MG tablet Take 1 tablet (20 mg total) by mouth daily. Take 1 tablet (20 mg) by mouth once a day on Mondays/ Wednesdays/ & Fridays Patient taking differently: Take 20 mg by mouth daily.  03/09/19  Yes Theora Gianotti, NP  traMADol (ULTRAM) 50 MG tablet Take 1 tablet (50 mg total) by mouth every 8 (eight) hours as needed. Patient to carry to his room Patient taking differently: Take 50 mg by mouth 3 (three) times daily.  09/29/18  Yes Triplett, Cari B, FNP  triamcinolone cream (KENALOG) 0.1 % Apply 1 application topically 2 (two) times daily.   Yes [provider]    Physical Exam: Vitals:   03/27/19 1600 03/27/19 1630 03/27/19 1700 03/27/19 1709  BP: (!) 129/57 120/61 128/63   Pulse: 75   78  Resp:  (!) 25 (!) 21   Temp:      TempSrc:      SpO2: 94%   95%  Weight:      Height:         Constitutional: NAD, calm, comfortable Vitals:   03/27/19 1600 03/27/19 1630 03/27/19 1700 03/27/19 1709  BP: (!) 129/57 120/61 128/63   Pulse: 75   78  Resp:  (!) 25 (!) 21   Temp:      TempSrc:      SpO2: 94%   95%  Weight:      Height:  Eyes: PERRL, lids and conjunctivae normal ENMT: Mucous membranes are moist. Posterior pharynx clear of any exudate or lesions.Normal dentition.  Neck: normal, supple, no masses, no thyromegaly Respiratory: clear to auscultation bilaterally, no wheezing, no crackles. Normal respiratory effort. No accessory muscle use.  Cardiovascular: Regular rate and rhythm, no murmurs / rubs / gallops. No extremity  edema. 2+ pedal pulses. No carotid bruits.  Abdomen: no tenderness, no masses palpated. No hepatosplenomegaly. Bowel sounds positive.  Musculoskeletal: no clubbing / cyanosis. No joint deformity upper and lower extremities. Good ROM, no contractures. Normal muscle tone.  Skin: no rashes, lesions, ulcers. No induration Neurologic: CN 2-12 grossly intact. Pt moving all 4 extremities.  Psychiatric: Normal judgment and insight. Alert and oriented x 3. Normal mood.   Labs on Admission: I have personally reviewed following labs and imaging studies  CBC: Recent Labs  Lab 03/27/19 0836  WBC 10.5  NEUTROABS 7.3  HGB 12.7*  HCT 39.2  MCV 90.5  PLT 99991111   Basic Metabolic Panel: Recent Labs  Lab 03/27/19 0836 03/27/19 1254  NA 132*  --   K 3.2*  --   CL 85*  --   CO2 31  --   GLUCOSE 148*  --   BUN 72*  --   CREATININE 2.48*  --   CALCIUM 9.5  --   MG  --  2.4  PHOS  --  4.1   GFR: Estimated Creatinine Clearance: 21.5 mL/min (A) (by C-G formula based on SCr of 2.48 mg/dL (H)). Liver Function Tests: Recent Labs  Lab 03/27/19 0836  AST 37  ALT 20  ALKPHOS 79  BILITOT 0.9  PROT 9.1*  ALBUMIN 3.8   No results for input(s): LIPASE, AMYLASE in the last 168 hours. No results for input(s): AMMONIA in the last 168 hours. Coagulation Profile: No results for input(s): INR, PROTIME in the last 168 hours. Cardiac Enzymes: No results for input(s): CKTOTAL, CKMB, CKMBINDEX, TROPONINI in the last 168 hours. BNP (last 3 results) No results for input(s): PROBNP in the last 8760 hours. HbA1C: No results for input(s): HGBA1C in the last 72 hours. CBG: No results for input(s): GLUCAP in the last 168 hours. Lipid Profile: Recent Labs    03/27/19 0836  TRIG 102   Thyroid Function Tests: Recent Labs    03/27/19 1254  TSH 0.169*  FREET4 0.86   Anemia Panel: Recent Labs    03/27/19 0836  FERRITIN 155   Urine analysis:    Component Value Date/Time   COLORURINE YELLOW (A)  03/27/2019 1030   APPEARANCEUR HAZY (A) 03/27/2019 1030   APPEARANCEUR Hazy 12/15/2013 1803   LABSPEC 1.015 03/27/2019 1030   LABSPEC 1.028 12/15/2013 1803   PHURINE 5.0 03/27/2019 1030   GLUCOSEU NEGATIVE 03/27/2019 1030   GLUCOSEU Negative 12/15/2013 1803   HGBUR NEGATIVE 03/27/2019 1030   BILIRUBINUR NEGATIVE 03/27/2019 1030   BILIRUBINUR Negative 12/15/2013 1803   KETONESUR NEGATIVE 03/27/2019 1030   PROTEINUR NEGATIVE 03/27/2019 1030   NITRITE NEGATIVE 03/27/2019 1030   LEUKOCYTESUR NEGATIVE 03/27/2019 1030   LEUKOCYTESUR Negative 12/15/2013 1803    Radiological Exams on Admission: DG Elbow Complete Right  Result Date: 03/27/2019 CLINICAL DATA:  Right elbow pain after fall yesterday. EXAM: RIGHT ELBOW - COMPLETE 3+ VIEW COMPARISON:  Right humerus x-rays dated September 29, 2018. FINDINGS: There is no evidence of fracture, dislocation, or joint effusion. There is no evidence of arthropathy or other focal bone abnormality. Soft tissues are unremarkable. IMPRESSION: Negative. Electronically Signed  By: Titus Dubin M.D.   On: 03/27/2019 09:07   CT Head Wo Contrast  Result Date: 03/27/2019 CLINICAL DATA:  Recent fall with headaches and neck pain, history of COVID-19 positivity EXAM: CT HEAD WITHOUT CONTRAST CT CERVICAL SPINE WITHOUT CONTRAST TECHNIQUE: Multidetector CT imaging of the head and cervical spine was performed following the standard protocol without intravenous contrast. Multiplanar CT image reconstructions of the cervical spine were also generated. COMPARISON:  11/06/2018 FINDINGS: CT HEAD FINDINGS Brain: Chronic atrophic and ischemic changes are noted. No findings to suggest acute hemorrhage, acute infarction or space-occupying mass lesion are seen. Vascular: No hyperdense vessel or unexpected calcification. Skull: Normal. Negative for fracture or focal lesion. Sinuses/Orbits: No acute finding. Other: None. CT CERVICAL SPINE FINDINGS Alignment: Within normal limits. Skull base  and vertebrae: 7 cervical segments are well visualized. Multilevel osteophytic changes are noted from C2-T1. Diffuse facet hypertrophic changes are noted resulting in bilateral neural foraminal narrowing. No acute fracture or acute facet abnormality is noted. Soft tissues and spinal canal: Surrounding soft tissue structures are within normal limits. Upper chest: Visualized lung apices are unremarkable. Other: None IMPRESSION: CT of the head: Chronic atrophic and ischemic changes without acute intracranial abnormality. CT of the cervical spine: Multilevel degenerative change without acute abnormality. Electronically Signed   By: Inez Catalina M.D.   On: 03/27/2019 09:27   CT Cervical Spine Wo Contrast  Result Date: 03/27/2019 CLINICAL DATA:  Recent fall with headaches and neck pain, history of COVID-19 positivity EXAM: CT HEAD WITHOUT CONTRAST CT CERVICAL SPINE WITHOUT CONTRAST TECHNIQUE: Multidetector CT imaging of the head and cervical spine was performed following the standard protocol without intravenous contrast. Multiplanar CT image reconstructions of the cervical spine were also generated. COMPARISON:  11/06/2018 FINDINGS: CT HEAD FINDINGS Brain: Chronic atrophic and ischemic changes are noted. No findings to suggest acute hemorrhage, acute infarction or space-occupying mass lesion are seen. Vascular: No hyperdense vessel or unexpected calcification. Skull: Normal. Negative for fracture or focal lesion. Sinuses/Orbits: No acute finding. Other: None. CT CERVICAL SPINE FINDINGS Alignment: Within normal limits. Skull base and vertebrae: 7 cervical segments are well visualized. Multilevel osteophytic changes are noted from C2-T1. Diffuse facet hypertrophic changes are noted resulting in bilateral neural foraminal narrowing. No acute fracture or acute facet abnormality is noted. Soft tissues and spinal canal: Surrounding soft tissue structures are within normal limits. Upper chest: Visualized lung apices are  unremarkable. Other: None IMPRESSION: CT of the head: Chronic atrophic and ischemic changes without acute intracranial abnormality. CT of the cervical spine: Multilevel degenerative change without acute abnormality. Electronically Signed   By: Inez Catalina M.D.   On: 03/27/2019 09:27   DG Chest Port 1 View  Result Date: 03/27/2019 CLINICAL DATA:  Unwitnessed fall EXAM: PORTABLE CHEST 1 VIEW COMPARISON:  03/27/2019, 03/10/2019 FINDINGS: Mild bilateral interstitial thickening. Small left pleural effusion with left basilar atelectasis. No pneumothorax. Stable cardiomediastinal silhouette. No aggressive osseous lesion. IMPRESSION: No acute cardiopulmonary disease. Stable small left pleural effusion with left basilar atelectasis. Electronically Signed   By: Kathreen Devoid   On: 03/27/2019 12:08   DG Chest Port 1 View  Result Date: 03/27/2019 CLINICAL DATA:  Shortness of breath. EXAM: PORTABLE CHEST 1 VIEW COMPARISON:  None. FINDINGS: Stable cardiomegaly. Atherosclerosis of thoracic aorta is noted. No pneumothorax is noted. Right lung is clear. Moderate size left pleural effusion is noted with probable underlying atelectasis or infiltrate. Old right clavicular fracture is again noted. IMPRESSION: Stable moderate size left pleural effusion is noted  with probable underlying atelectasis or infiltrate. Aortic Atherosclerosis (ICD10-I70.0). Electronically Signed   By: Marijo Conception M.D.   On: 03/27/2019 09:08    EKG: Sinus 81, No ST changes.  Assessment/Plan Essential hypertension Assessment & Plan Pt's bp is soft we will hold his diuretic therapy and continue metoprolol and imdur.     Coronary artery disease involving native coronary artery without angina pectoris Assessment & Plan Pt is continued on his metoprolol and Imdur .   * Pneumonia due to COVID-19 virus Assessment & Plan Pt started on regimen of Remdesivir /decadron.vitc and zinc.   supplemental Oxygen prn to keep goal sats 90 and above.    Cautious diuretics.   GERD (gastroesophageal reflux disease) Assessment & Plan Iv ppi therapy.    Acute kidney injury [facility]) Assessment & Plan Acute kidney injury due to multifactorial etiology and esp diuretic therapy. With baseline chronic kidney disease.per sone they have not seen nephrology. We will avoid contrast studies.  Renally dose meds.     DVT prophylaxis: Heparin . Code Status: DNR. Family Communication: Mukesh Wunschel Alleman [phone]  . Disposition Plan: SNF Consults called: None Admission status: Inpatient Para Skeans MD [facility] If 7PM-7AM, please contact night-coverage [email or web address] Password [facility]  03/27/2019, 6:32 PM

## 2019-03-27 NOTE — ED Notes (Signed)
Medications administered per MD order, meds given with apple sauce, pt noted to be alert, eating lunch at this time.

## 2019-03-27 NOTE — ED Triage Notes (Signed)
Pt presents to ED via [facility] from [facility], per EMS pt with +Covid test, unsure when patient had +covid test. Pt with unwitnessed fall yesterday, c/o head pain and skin tear to R elbow at this time. Per EMS pt with increasing weakness, pt's wife reports that patient is unable to walk however patient is able to walk at baseline, from previous encounters patient max assist with walking at baseline. EMS reports per wife patient's abdominal distention is baseline for patient.

## 2019-03-27 NOTE — ED Notes (Signed)
Date and time results received: 03/27/19 0945 (use smartphrase ".now" to insert current time)  Test: Lactic Critical Value: 2.4  Name of Provider Notified: Jari Pigg, MD  Orders Received? Or Actions Taken?: Critical results acknowledged

## 2019-03-27 NOTE — Telephone Encounter (Signed)
Noted. Will leave in the in basket for Ignacia Bayley, NP review as an FYI.

## 2019-03-27 NOTE — Telephone Encounter (Signed)
Patient wife calling in to state patient has been taking to the ED from [place]. Patient fell and that is what resulted in the ED visit. Patients wife just wanted to make office aware

## 2019-03-27 NOTE — Assessment & Plan Note (Signed)
Pt's bp is soft we will hold his diuretic therapy and continue metoprolol and imdur.

## 2019-03-27 NOTE — Assessment & Plan Note (Signed)
Pt started on regimen of Remdesivir /decadron.vitc and zinc.   supplemental Oxygen prn to keep goal sats 90 and above.  Cautious diuretics.

## 2019-03-27 NOTE — Assessment & Plan Note (Signed)
Iv ppi therapy.  

## 2019-03-27 NOTE — Assessment & Plan Note (Signed)
Pt is continued on his metoprolol and Imdur .

## 2019-03-27 NOTE — Telephone Encounter (Signed)
FYI- fwd to Dr. Fletcher Anon and Ignacia Bayley, NP

## 2019-03-27 NOTE — ED Provider Notes (Signed)
[facility] Emergency Department Provider Note  ____________________________________________   First MD Initiated Contact with Patient 03/27/19 0820     (approximate)  I have reviewed the triage vital signs and the nursing notes.   HISTORY  Chief Complaint Fall and Weakness    HPI Trooper L Gascoigne is a 86 y.o. male witih HF, [name], HTN, HLD who comes in with weakness.  Pt had a fall last night.  Pt states he just felt weak in his legs, severe, intermittent, nothing makes it better or worse. Also with shortness of breath over the past 3 days. No chest pain. No abd pain. No fevers. Per EMS was covid positive but unclear when?  I did talk to the facility and they said that he tested positive on 2/4.  He also has been in new renal failure and entering to fax over his labs.      Past Medical History:  Diagnosis Date  . (HFpEF) heart failure with preserved ejection fraction ([place])    a. 11/2017 Echo: EF 60-65%, no rwma, GR1 DD. Nl RV fxn.  . Anginal pain ([place])    c/o heaviness a few years ago.  stent placed and all resolved  . Anxiety   . BPH (benign prostatic hypertrophy)   . Carotid stenosis    a. 05/2015 Carotid U/S: mild nonobs atherosclerosis. No need for f/u.  Marland Kitchen Chronic back pain   . Chronic Left Pleural Effusion    a. 11/2017 Mod by CXR.  Marland Kitchen Coronary artery disease    a. 06/2013 Cath/PCI: LAD 50-70p (FFR 0.82-->PCI w/ 3.5x24 Promus DES), RCA subtotally occluded w/ L->R collats, LCX 30ost.  . Dyslipidemia   . GERD (gastroesophageal reflux disease)   . Hx of [name]   . Hyperlipidemia   . Hypertension   . Inguinal hernia   . Lightheadedness    a. chronic, somewhat positional.  . 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 Major depression   . Sleep apnea    will not use cpap    Patient Active Problem List   Diagnosis Date Noted  . Malnutrition of moderate degree 06/15/2017  .  Pneumonia 06/13/2017  . 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 S/P kyphoplasty 01/14/2017  . Respiratory failure, post-operative ([place]) 12/31/2016  . Acute respiratory failure ([place]) 12/30/2016  . Altered mental status 12/07/2016  . Acute kidney injury ([place]) 09/14/2016  . Head injury, intracranial, without loss of consciousness or fracture ([place]) 09/07/2016  . Groin pain, left 06/15/2016  . Closed right hip fracture, with routine healing, subsequent encounter 04/15/2016  . Productive cough 03/23/2016  . Medicare annual wellness visit, subsequent 02/19/2016  . Major depression 10/14/2015  . Behavior disturbance 09/12/2015  . Mobility impaired 08/27/2015  . Skin ulcer of back ([place]) 07/01/2015  . Laceration of right hand 07/01/2015  . Traumatic hematoma of lower back 07/01/2015  . Encounters for administrative purpose 04/01/2015  . Skin lesion of face 12/24/2014  . Contusion of right hand 11/28/2014  . Herpes zoster 10/30/2014  . Need for immunization against influenza 10/24/2014  . Compression fracture of L4 lumbar vertebra 09/28/2014  . Rectal or anal pain 08/13/2014  . Anxiety disorder 07/23/2014  . BPH without obstruction/lower urinary tract symptoms 07/23/2014  . Atherosclerosis of coronary artery 07/23/2014  . Chronic LBP 07/23/2014  . Hypercholesteremia 07/23/2014  . Benign hypertension 07/23/2014  . Acid  reflux 07/23/2014  . Chronic recurrent major depressive disorder ([place]) 07/23/2014  . GERD (gastroesophageal reflux disease) 07/23/2014  . Coronary artery disease involving native coronary artery without angina pectoris 07/06/2014  . Essential hypertension 07/06/2014  . Carotid stenosis 07/06/2014  . Hyperlipidemia   . H/O [name] 12/20/2011    Past Surgical History:  Procedure Laterality Date  . ANTERIOR APPROACH HEMI HIP ARTHROPLASTY Right 04/16/2016   Procedure: ANTERIOR APPROACH HEMI HIP ARTHROPLASTY;  Surgeon:  Hessie Knows, MD;  Location: [facility] ORS;  Service: Orthopedics;  Laterality: Right;  . APPENDECTOMY     86 years old  . arm fracture Right 2008   rod in arm  . CARDIAC CATHETERIZATION  2002  . CARDIAC CATHETERIZATION  06/2013   stent placed  . COLONOSCOPY    . EYE SURGERY Bilateral 2008   cataract extraction  . FEMUR FRACTURE SURGERY Left    with rod  . HEMORROIDECTOMY     75 years ago  . HIP FRACTURE SURGERY    . JOINT REPLACEMENT Bilateral 03/2016   right partial knee replacement, bilateral THR  . KYPHOPLASTY N/A 12/30/2016   Procedure: [id number];  Surgeon: Hessie Knows, MD;  Location: [facility] ORS;  Service: Orthopedics;  Laterality: N/A;    Prior to Admission medications   Medication Sig Start Date End Date Taking? Authorizing Provider  acetaminophen (TYLENOL) 500 MG tablet Take 2 tablets (1,000 mg total) by mouth every 6 (six) hours. 04/17/16   Dustin Flock, MD  acidophilus (RISAQUAD) CAPS capsule Take 1 capsule by mouth daily.    [provider]  aspirin 81 MG EC tablet Take 81 mg by mouth daily. Swallow whole.    [provider]  Cholecalciferol (VITAMIN D3) 5000 units CAPS Take by mouth.    [provider]  clonazePAM (KLONOPIN) 1 MG tablet Take 1 mg by mouth 2 (two) times daily as needed for anxiety.    [provider]  diclofenac sodium (VOLTAREN) 1 % GEL Apply 1 application topically as directed. 06/21/18   [provider]  docusate sodium (COLACE) 100 MG capsule Take 100 mg by mouth 2 (two) times daily.    [provider]  doxazosin (CARDURA) 1 MG tablet Take 1 tablet (1 mg total) by mouth daily. 05/28/17   Lada, Satira Anis, MD  escitalopram (LEXAPRO) 10 MG tablet Take 10 mg by mouth daily. 10/21/18   [provider]  gabapentin (NEURONTIN) 300 MG capsule Take 300 mg by mouth at bedtime.  11/18/18   [provider]  guaiFENesin (ROBITUSSIN) 100 MG/5ML liquid Take 200 mg by mouth 3 (three) times daily as  needed for cough.    [provider]  hydrocortisone 2.5 % cream  12/08/18   [provider]  isosorbide mononitrate (IMDUR) 30 MG 24 hr tablet Take 1 tablet (30 mg total) by mouth daily. 02/15/17   Roselee Nova, MD  ketoconazole (NIZORAL) 2 % cream  12/08/18   [provider]  loperamide (IMODIUM) 2 MG capsule Take 2 mg by mouth as needed for diarrhea or loose stools.    [provider]  metolazone (ZAROXOLYN) 2.5 MG tablet Take 1 tablet (2.5 mg) by mouth once a day on Mondays/ Wednesdays/ & Fridays 03/03/19   Theora Gianotti, NP  metoprolol tartrate (LOPRESSOR) 25 MG tablet Take 12.5 mg by mouth 2 (two) times daily.     [provider]  Multiple Vitamins-Minerals (PRESERVISION/LUTEIN PO) Take by mouth 2 (two) times daily.  [provider]  nitroGLYCERIN (NITROSTAT) 0.4 MG SL tablet Place 0.4 mg under the tongue every 5 (five) minutes as needed for chest pain.    [provider]  pantoprazole (PROTONIX) 40 MG tablet Take 1 tablet (40 mg total) by mouth daily. 03/16/17   Roselee Nova, MD  polyethylene glycol [facility] / Floria Raveling) packet Take 17 g by mouth daily.    [provider]  pravastatin (PRAVACHOL) 40 MG tablet Take 1 tablet (40 mg total) by mouth daily. Patient taking differently: Take 40 mg by mouth 3 (three) times a week.  02/15/17   Roselee Nova, MD  senna (SENOKOT) 8.6 MG TABS tablet Take 1 tablet by mouth daily.    [provider]  torsemide (DEMADEX) 20 MG tablet Take 1 tablet (20 mg total) by mouth daily. Take 1 tablet (20 mg) by mouth once a day on Mondays/ Wednesdays/ & Fridays 03/09/19   Theora Gianotti, NP  traMADol (ULTRAM) 50 MG tablet Take 1 tablet (50 mg total) by mouth every 8 (eight) hours as needed. Patient to carry to his room Patient taking differently: Take 50 mg by mouth every 6 (six) hours as needed. Patient to carry to his room 09/29/18   Sherrie George B,  FNP    Allergies Cyclobenzaprine, Other, Sulfamethoxazole, Baclofen, Nsaids, Sulfamethoxazole-trimethoprim, and Tolmetin  Family History  Problem Relation Age of Onset  . Heart disease Mother   . Stroke Mother   . Diabetes Father   . Dementia Father   . Stroke Sister   . Stroke Brother   . Stroke Sister   . Stroke Sister   . Stroke Brother   . Stroke Brother     Social History Social History   Tobacco Use  . Smoking status: Former Smoker    Packs/day: 2.00    Years: 7.00    Pack years: 14.00    Types: Cigarettes    Quit date: 09/17/1953    Years since quitting: 65.5  . Smokeless tobacco: Never Used  Substance Use Topics  . Alcohol use: No    Alcohol/week: 0.0 standard drinks  . Drug use: No      Review of Systems Constitutional: No fever/chills, positive weakness Eyes: No visual changes. [name]: No sore throat. Cardiovascular: Denies chest pain. Respiratory: Positive shortness of breath Gastrointestinal: No abdominal pain.  No nausea, no vomiting.  No diarrhea.  No constipation. Genitourinary: Negative for dysuria. Musculoskeletal: Negative for back pain.  Positive leg weakness Skin: Negative for rash. Neurological: Negative for headaches, focal weakness or numbness. All other ROS negative ____________________________________________   PHYSICAL EXAM:  VITAL SIGNS: Blood pressure (!) 145/66, pulse 84, temperature 98.8 F (37.1 C), temperature source Oral, resp. rate 16, height 5\' 10"  (1.778 m), weight 94.8 kg, SpO2 100 %.   Constitutional: Alert and oriented. x2 thought it was 2020 well appearing and in no acute distress. Eyes: Conjunctivae are normal. EOMI. Head: Endorses pain on touching his head but no laceration noted. Nose: No congestion/rhinnorhea. Mouth/Throat: Mucous membranes are moist.   Neck: No stridor. Trachea Midline. FROM Cardiovascular: Normal rate, regular rhythm. Grossly normal heart sounds.  Good peripheral circulation. Respiratory:  Normal respiratory effort.  No retractions. Lungs CTAB.  Patient is satting 89 to 90% on room air Gastrointestinal: Soft and nontender. No distention. No abdominal bruits.  Musculoskeletal: No lower extremity tenderness nor edema.  No joint effusions.  Skin abrasion on the right elbow.  Able to lift both legs up off the  bed.  No tenderness at the hips. Neurologic:  Normal speech and language. No gross focal neurologic deficits are appreciated.  Skin:  Skin is warm, dry and intact. No rash noted. Psychiatric: Mood and affect are normal. Speech and behavior are normal. GU: Deferred   ____________________________________________   LABS (all labs ordered are listed, but only abnormal results are displayed)  Labs Reviewed  LACTIC ACID, PLASMA - Abnormal; Notable for the following components:      Result Value   Lactic Acid, Venous 2.4 (*)    All other components within normal limits  CBC WITH DIFFERENTIAL/PLATELET - Abnormal; Notable for the following components:   Hemoglobin 12.7 (*)    Monocytes Absolute 2.0 (*)    Abs Immature Granulocytes 0.12 (*)    All other components within normal limits  BRAIN NATRIURETIC PEPTIDE - Abnormal; Notable for the following components:   B Natriuretic Peptide 184.0 (*)    All other components within normal limits  CULTURE, BLOOD (ROUTINE X 2)  CULTURE, BLOOD (ROUTINE X 2)  FIBRINOGEN  LACTIC ACID, PLASMA  COMPREHENSIVE METABOLIC PANEL  FIBRIN DERIVATIVES D-DIMER ([facility] ONLY)  PROCALCITONIN  LACTATE DEHYDROGENASE  FERRITIN  TRIGLYCERIDES  C-REACTIVE PROTEIN  URINALYSIS, ROUTINE W REFLEX MICROSCOPIC  TROPONIN I (HIGH SENSITIVITY)   ____________________________________________   ED ECG REPORT I, Vanessa Overton, the attending physician, personally viewed and interpreted this ECG.  EKG is normal sinus rate of 81, no ST elevation, no T wave inversions, normal intervals, does have a somewhat wandering  baseline   ____________________________________________  RADIOLOGY I, Vanessa [place], personally viewed and evaluated these images (plain radiographs) as part of my medical decision making, as well as reviewing the written report by the radiologist.   ED MD interpretation: X-rays are negative for fracture Does have a pleural effusion on the left  Official radiology report(s): DG Elbow Complete Right  Result Date: 03/27/2019 CLINICAL DATA:  Right elbow pain after fall yesterday. EXAM: RIGHT ELBOW - COMPLETE 3+ VIEW COMPARISON:  Right humerus x-rays dated September 29, 2018. FINDINGS: There is no evidence of fracture, dislocation, or joint effusion. There is no evidence of arthropathy or other focal bone abnormality. Soft tissues are unremarkable. IMPRESSION: Negative. Electronically Signed   By: Titus Dubin M.D.   On: 03/27/2019 09:07   CT Head Wo Contrast  Result Date: 03/27/2019 CLINICAL DATA:  Recent fall with headaches and neck pain, history of COVID-19 positivity EXAM: CT HEAD WITHOUT CONTRAST CT CERVICAL SPINE WITHOUT CONTRAST TECHNIQUE: Multidetector CT imaging of the head and cervical spine was performed following the standard protocol without intravenous contrast. Multiplanar CT image reconstructions of the cervical spine were also generated. COMPARISON:  11/06/2018 FINDINGS: CT HEAD FINDINGS Brain: Chronic atrophic and ischemic changes are noted. No findings to suggest acute hemorrhage, acute infarction or space-occupying mass lesion are seen. Vascular: No hyperdense vessel or unexpected calcification. Skull: Normal. Negative for fracture or focal lesion. Sinuses/Orbits: No acute finding. Other: None. CT CERVICAL SPINE FINDINGS Alignment: Within normal limits. Skull base and vertebrae: 7 cervical segments are well visualized. Multilevel osteophytic changes are noted from C2-T1. Diffuse facet hypertrophic changes are noted resulting in bilateral neural foraminal narrowing. No acute fracture  or acute facet abnormality is noted. Soft tissues and spinal canal: Surrounding soft tissue structures are within normal limits. Upper chest: Visualized lung apices are unremarkable. Other: None IMPRESSION: CT of the head: Chronic atrophic and ischemic changes without acute intracranial abnormality. CT of the cervical spine: Multilevel degenerative change without acute  abnormality. Electronically Signed   By: Inez Catalina M.D.   On: 03/27/2019 09:27   CT Cervical Spine Wo Contrast  Result Date: 03/27/2019 CLINICAL DATA:  Recent fall with headaches and neck pain, history of COVID-19 positivity EXAM: CT HEAD WITHOUT CONTRAST CT CERVICAL SPINE WITHOUT CONTRAST TECHNIQUE: Multidetector CT imaging of the head and cervical spine was performed following the standard protocol without intravenous contrast. Multiplanar CT image reconstructions of the cervical spine were also generated. COMPARISON:  11/06/2018 FINDINGS: CT HEAD FINDINGS Brain: Chronic atrophic and ischemic changes are noted. No findings to suggest acute hemorrhage, acute infarction or space-occupying mass lesion are seen. Vascular: No hyperdense vessel or unexpected calcification. Skull: Normal. Negative for fracture or focal lesion. Sinuses/Orbits: No acute finding. Other: None. CT CERVICAL SPINE FINDINGS Alignment: Within normal limits. Skull base and vertebrae: 7 cervical segments are well visualized. Multilevel osteophytic changes are noted from C2-T1. Diffuse facet hypertrophic changes are noted resulting in bilateral neural foraminal narrowing. No acute fracture or acute facet abnormality is noted. Soft tissues and spinal canal: Surrounding soft tissue structures are within normal limits. Upper chest: Visualized lung apices are unremarkable. Other: None IMPRESSION: CT of the head: Chronic atrophic and ischemic changes without acute intracranial abnormality. CT of the cervical spine: Multilevel degenerative change without acute abnormality.  Electronically Signed   By: Inez Catalina M.D.   On: 03/27/2019 09:27   DG Chest Port 1 View  Result Date: 03/27/2019 CLINICAL DATA:  Shortness of breath. EXAM: PORTABLE CHEST 1 VIEW COMPARISON:  None. FINDINGS: Stable cardiomegaly. Atherosclerosis of thoracic aorta is noted. No pneumothorax is noted. Right lung is clear. Moderate size left pleural effusion is noted with probable underlying atelectasis or infiltrate. Old right clavicular fracture is again noted. IMPRESSION: Stable moderate size left pleural effusion is noted with probable underlying atelectasis or infiltrate. Aortic Atherosclerosis (ICD10-I70.0). Electronically Signed   By: Marijo Conception M.D.   On: 03/27/2019 09:08    ____________________________________________   PROCEDURES  Procedure(s) performed (including Critical Care):  .Critical Care Performed by: Vanessa Roland, MD Authorized by: Vanessa [place], MD   Critical care provider statement:    Critical care time (minutes):  31   Critical care was necessary to treat or prevent imminent or life-threatening deterioration of the following conditions:  Respiratory failure   Critical care was time spent personally by me on the following activities:  Discussions with consultants, evaluation of patient's response to treatment, examination of patient, ordering and performing treatments and interventions, ordering and review of laboratory studies, ordering and review of radiographic studies, pulse oximetry, re-evaluation of patient's condition, obtaining history from patient or surrogate and review of old charts     ____________________________________________   INITIAL IMPRESSION / ASSESSMENT AND PLAN / ED COURSE  Baldomero L Newkirk was evaluated in Emergency Department on 03/27/2019 for the symptoms described in the history of present illness. He was evaluated in the context of the global COVID-19 pandemic, which necessitated consideration that the patient might be at risk for  infection with the SARS-CoV-2 virus that causes COVID-19. Institutional protocols and algorithms that pertain to the evaluation of patients at risk for COVID-19 are in a state of rapid change based on information released by regulatory bodies including the CDC and federal and state organizations. These policies and algorithms were followed during the patient's care in the ED.    Patient is a 86 year old who presents with fall and weakness.  Will get CT scan to evaluate for intracranial hemorrhage  and CT cervical evaluate for cervical fracture.  Chest x-ray to evaluate for his coronavirus pneumonia.  We will get coronavirus labs given patient satting 89 to 90%.  Patient placed on 2 L.  Suspect patient will need admission due to weakness and coronavirus.  Will get labs evaluate for electrolyte abnormalities, AKI.  Will order Covid order set.  Lactate slightly elevated.  We will give some fluids.  CT imaging is negative for fractures or bleed.  Does have a stable effusion.  We will discussed with the hospitalist for admission due to hypoxia in the setting of Covid   ____________________________________________   FINAL CLINICAL IMPRESSION(S) / ED DIAGNOSES   Final diagnoses:  COVID-19  Weakness  Acute respiratory failure with hypoxia (HCC)      MEDICATIONS GIVEN DURING THIS VISIT:  Medications  Tdap (BOOSTRIX) injection 0.5 mL (0.5 mLs Intramuscular Given 03/27/19 1018)  sodium chloride 0.9 % bolus 500 mL (500 mLs Intravenous New Bag/Given 03/27/19 1009)     ED Discharge Orders    None       Note:  This document was prepared using Dragon voice recognition software and may include unintentional dictation errors.   Vanessa [place], MD 03/27/19 1100

## 2019-03-27 NOTE — ED Notes (Signed)
This RN to bedside due to hearing patient screaming. This RN opened door. Pt visualized in NAD, this RN attempting to explain to patient that this RN with another patient would be right there when done with other patient. Pt demanding this RN come to bedside and give him something to drink, pt also demanding his call bell, this RN pointed out call bell directly behind patient hand on bed and within sight of patient. Pt looked at call bell and demanded this RN come into room. This RN explained would be right with patient as soon as done with other patient. Lights remain dimmed for comfort.

## 2019-03-27 NOTE — ED Notes (Signed)
Report called to natalia rn floor nurse

## 2019-03-27 NOTE — ED Notes (Signed)
This RN called lab and spoke with Levada Dy, Gaffer. Explained that per Dr. Posey Pronto okay to cancel due to being duplicate orders.

## 2019-03-27 NOTE — ED Notes (Signed)
Resumed care from [facility].  Pt alert. Pt on 2 liters oxygen .  nsr on monitor.  Iv in place.  siderails up x 2.

## 2019-03-27 NOTE — Progress Notes (Signed)
Remdesivir - Pharmacy Brief Note   O:  ALT:   CXR: Stable small left pleural effusion with left basilar atelectasis. SpO2: 89-90% on RA covid + at facility  And covid + in ER today 2/8   A/P:  Remdesivir 200 mg IVPB once followed by 100 mg IVPB daily x 4 days.   Chinita Greenland PharmD Clinical Pharmacist 03/27/2019

## 2019-03-27 NOTE — Telephone Encounter (Signed)
Unfortunately, he is being admitted with COVID-19 and acute on chronic renal failure.

## 2019-03-27 NOTE — ED Notes (Addendum)
In and out cath performed by this RN and Farley Ly, RN, pt tolerated well, condom cath applied by this RN and Maudie Mercury, Therapist, sports. Pt tolerated well. Pt given more warm blankets for comfort. Meds administered per MD order. Bladder drained while in and out performed. 1077mL of urine noted at this time. Clean and dry brief placed on patient at this time.

## 2019-03-27 NOTE — Plan of Care (Signed)
Pt admitted at [facility]

## 2019-03-27 NOTE — ED Notes (Signed)
Message sent to pharmacy regarding verifying patient's meds due to being an admission hold.

## 2019-03-27 NOTE — ED Notes (Signed)
This RN called [facility], explained called for Covid+ results 2 times prior but still had not received them, spoke with aid who endorses that calls had been received. States she will fax them over.

## 2019-03-27 NOTE — Assessment & Plan Note (Signed)
Acute kidney injury due to multifactorial etiology and esp diuretic therapy. With baseline chronic kidney disease.per sone they have not seen nephrology. We will avoid contrast studies.  Renally dose meds.

## 2019-03-27 NOTE — ED Notes (Signed)
Nursing supervisor [facility] rn informed pt does not have hard copy of positive result of covid from 03/23/19 at [facility].   Nursing home did not fax it over per request from ER staff earlier today.   Pt has a positive covid result today in er and it  is documented in today's chart.  danielle rn charge nurse of 1[facility] doesn't want to take pt without hard copy of positive result.

## 2019-03-28 DIAGNOSIS — L8990 Pressure ulcer of unspecified site, unspecified stage: Secondary | ICD-10-CM | POA: Insufficient documentation

## 2019-03-28 LAB — CBC WITH DIFFERENTIAL/PLATELET
Abs Immature Granulocytes: 0.12 10*3/uL — ABNORMAL HIGH (ref 0.00–0.07)
Basophils Absolute: 0 10*3/uL (ref 0.0–0.1)
Basophils Relative: 0 %
Eosinophils Absolute: 0 10*3/uL (ref 0.0–0.5)
Eosinophils Relative: 0 %
HCT: 34.1 % — ABNORMAL LOW (ref 39.0–52.0)
Hemoglobin: 11.1 g/dL — ABNORMAL LOW (ref 13.0–17.0)
Immature Granulocytes: 1 %
Lymphocytes Relative: 15 %
Lymphs Abs: 1.5 10*3/uL (ref 0.7–4.0)
MCH: 29.7 pg (ref 26.0–34.0)
MCHC: 32.6 g/dL (ref 30.0–36.0)
MCV: 91.2 fL (ref 80.0–100.0)
Monocytes Absolute: 2.3 10*3/uL — ABNORMAL HIGH (ref 0.1–1.0)
Monocytes Relative: 22 %
Neutro Abs: 6.3 10*3/uL (ref 1.7–7.7)
Neutrophils Relative %: 62 %
Platelets: 136 10*3/uL — ABNORMAL LOW (ref 150–400)
RBC: 3.74 MIL/uL — ABNORMAL LOW (ref 4.22–5.81)
RDW: 14.6 % (ref 11.5–15.5)
WBC: 10.2 10*3/uL (ref 4.0–10.5)
nRBC: 0 % (ref 0.0–0.2)

## 2019-03-28 LAB — C-REACTIVE PROTEIN: CRP: 11 mg/dL — ABNORMAL HIGH (ref <1.0)

## 2019-03-28 LAB — COMPREHENSIVE METABOLIC PANEL
ALT: 19 U/L (ref 0–44)
AST: 51 U/L — ABNORMAL HIGH (ref 15–41)
Albumin: 3.1 g/dL — ABNORMAL LOW (ref 3.5–5.0)
Alkaline Phosphatase: 67 U/L (ref 38–126)
Anion gap: 13 (ref 5–15)
BUN: 62 mg/dL — ABNORMAL HIGH (ref 8–23)
CO2: 32 mmol/L (ref 22–32)
Calcium: 8.9 mg/dL (ref 8.9–10.3)
Chloride: 94 mmol/L — ABNORMAL LOW (ref 98–111)
Creatinine, Ser: 1.84 mg/dL — ABNORMAL HIGH (ref 0.61–1.24)
GFR calc Af Amer: 36 mL/min — ABNORMAL LOW (ref >60)
GFR calc non Af Amer: 31 mL/min — ABNORMAL LOW (ref >60)
Glucose, Bld: 113 mg/dL — ABNORMAL HIGH (ref 70–99)
Potassium: 3.4 mmol/L — ABNORMAL LOW (ref 3.5–5.1)
Sodium: 139 mmol/L (ref 135–145)
Total Bilirubin: 0.6 mg/dL (ref 0.3–1.2)
Total Protein: 7.9 g/dL (ref 6.5–8.1)

## 2019-03-28 LAB — PROCALCITONIN: Procalcitonin: 0.19 ng/mL

## 2019-03-28 LAB — PHOSPHORUS: Phosphorus: 3.8 mg/dL (ref 2.5–4.6)

## 2019-03-28 LAB — FERRITIN: Ferritin: 252 ng/mL (ref 24–336)

## 2019-03-28 LAB — MAGNESIUM: Magnesium: 2.4 mg/dL (ref 1.7–2.4)

## 2019-03-28 LAB — FIBRIN DERIVATIVES D-DIMER (ARMC ONLY): Fibrin derivatives D-dimer (ARMC): 1718.92 ng/mL (FEU) — ABNORMAL HIGH (ref 0.00–499.00)

## 2019-03-28 LAB — HEMOGLOBIN A1C
Hgb A1c MFr Bld: 6.4 % — ABNORMAL HIGH (ref 4.8–5.6)
Mean Plasma Glucose: 137 mg/dL

## 2019-03-28 NOTE — Progress Notes (Signed)
Call to CCMD to D/C telemetry monitoring per order.

## 2019-03-28 NOTE — Progress Notes (Signed)
Noted WC order placed. WC dressing changed by WC nurse at  Bedside at 0830 this morning. DSG change next due tomorrow am per order.

## 2019-03-28 NOTE — Progress Notes (Signed)
PT Cancellation Note  Patient Details Name: EPIFANIO DUCHATEAU MRN: [id number] DOB: April 12, 1923   Cancelled Treatment:    Reason Eval/Treat Not Completed: Fatigue/lethargy limiting ability to participate(chart reviewed, RN consulted. Pt drowsy earlier with OT, pt now asleep upon arrival, does not awake easily. Will attempt again at later date/time when pt better able to participate.)  2:05 PM, 03/28/19 Etta Grandchild, PT, DPT Physical Therapist - [facility]  [phone] ([place])    [place] [facility] 03/28/2019, 2:05 PM

## 2019-03-28 NOTE — Progress Notes (Signed)
PROGRESS NOTE    [name]  [id number] DOB: 09/17/1923 DOA: 03/27/2019 PCP: System, Pcp Not In   Brief Narrative:  [name] is a 86 y.o. male with medical history significant of HTN, [name], CKD.  Sent to ED from his facility after having a fall and with complaint of generalized weakness and shortness of breath for the past 3 days.  Apparently tested positive for Covid at his facility on 03/23/2019 with no documentation. Labs were positive for Covid, AKI with creatinine of 2.48, chest x-ray with stable chronic left pleural effusion.  Mildly hypoxic requiring 2 L.  Admitted for COVID-19 infection and started on remdesivir and steroid.  Subjective: Pt.was keep saying please help me. Denies any pain, had chronic back pain, currently at 2/10. No N/V/D.  Assessment & Plan:   Principal Problem:   Pneumonia due to COVID-19 virus Active Problems:   Coronary artery disease involving native coronary artery without angina pectoris   Essential hypertension   GERD (gastroesophageal reflux disease)   Acute kidney injury ([place])   Pressure injury of skin  COVID-19 infection.  Elevated inflammatory markers.  PCT 0.28 on admission, now 0.19. -Continue with remdesivir-day 2 -Continue with steroid. -Continue with vitamin C and zinc supplement. -Supplemental oxygen to keep saturation above 90%. -Supportive care with antitussive and inhalers. -Monitor inflammatory markers.  [name].  Prior echocardiogram with grade 1 diastolic dysfunction.  BNP mildly elevated at 168 with barely positive troponin most likely demand. No chest pain. -Continue with home dose of metoprolol and Imdur. -Holding diuretic due to AKI.  AKI with CKD stage IIIa.  Creatinine elevated at 2.48 on admission with baseline of 1.5-1.7. Started improving, it was 1.84 today. -Keep holding diuretic. -Monitor renal function. -Avoid nephrotoxic.  Right elbow Wound.  Apparently had a fall at his facility a day prior to admission.  2  large lacerations around right elbow. Wound care was consulted-appreciate their recommendations.  GERD. -Continue Protonix.  Objective: Vitals:   03/27/19 1709 03/27/19 1833 03/27/19 2200 03/28/19 0745  BP:  128/75  140/70  Pulse: 78 71  66  Resp:  (!) 25  16  Temp:  98.2 F (36.8 C) 98.3 F (36.8 C) 97.9 F (36.6 C)  TempSrc:      SpO2: 95% 99%  99%  Weight:      Height:        Intake/Output Summary (Last 24 hours) at 03/28/2019 0746 Last data filed at 03/28/2019 [id number] Gross per 24 hour  Intake 1249.66 ml  Output 1500 ml  Net -250.34 ml   Filed Weights   03/27/19 0833  Weight: 94.8 kg    Examination:  General exam: Chronically ill-appearing anxious looking elderly man, in no acute distress. Respiratory system: Clear to auscultation. Respiratory effort normal. Cardiovascular system: S1 & S2 heard, RRR. No JVD, murmurs, rubs, gallops or clicks. Gastrointestinal system: Soft, nontender, nondistended, bowel sounds positive. Central nervous system: Alert and oriented to self only, no focal neurological deficits.Symmetric 5 x 5 power. Extremities: No edema, no cyanosis, pulses intact and symmetrical. Skin: No large lacerations around right elbow with multiple ecchymosis in extremities. Psychiatry: Judgement and insight appear impaired.   DVT prophylaxis: Heparin Code Status: DNR Family Communication:  Disposition Plan: Pending improvement and completion of remdesivir.  He will go back to his facility.  Consultants:   None  Procedures:  Antimicrobials:   Data Reviewed: I have personally reviewed following labs and imaging studies  CBC: Recent Labs  Lab 03/27/19 0836 03/28/19  [id number]  WBC 10.5 10.2  NEUTROABS 7.3 6.3  HGB 12.7* 11.1*  HCT 39.2 34.1*  MCV 90.5 91.2  PLT 155 [id number]*   Basic Metabolic Panel: Recent Labs  Lab 03/27/19 0836 03/27/19 1254 03/28/19 0608  NA 132*  --  139  K 3.2*  --  3.4*  CL 85*  --  94*  CO2 31  --  32  GLUCOSE 148*  --   113*  BUN 72*  --  62*  CREATININE 2.48*  --  1.84*  CALCIUM 9.5  --  8.9  MG  --  2.4 2.4  PHOS  --  4.1 3.8   GFR: Estimated Creatinine Clearance: 29 mL/min (A) (by C-G formula based on SCr of 1.84 mg/dL (H)). Liver Function Tests: Recent Labs  Lab 03/27/19 0836 03/28/19 0608  AST 37 51*  ALT 20 19  ALKPHOS 79 67  BILITOT 0.9 0.6  PROT 9.1* 7.9  ALBUMIN 3.8 3.1*   No results for input(s): LIPASE, AMYLASE in the last 168 hours. No results for input(s): AMMONIA in the last 168 hours. Coagulation Profile: No results for input(s): INR, PROTIME in the last 168 hours. Cardiac Enzymes: No results for input(s): CKTOTAL, CKMB, CKMBINDEX, TROPONINI in the last 168 hours. BNP (last 3 results) No results for input(s): PROBNP in the last 8760 hours. HbA1C: No results for input(s): HGBA1C in the last 72 hours. CBG: No results for input(s): GLUCAP in the last 168 hours. Lipid Profile: Recent Labs    03/27/19 0836  TRIG 102   Thyroid Function Tests: Recent Labs    03/27/19 1254  TSH 0.169*  FREET4 0.86   Anemia Panel: Recent Labs    03/27/19 0836 03/28/19 0608  FERRITIN 155 252   Sepsis Labs: Recent Labs  Lab 03/27/19 0836 03/27/19 1111  PROCALCITON 0.28  --   LATICACIDVEN 2.4* 1.6    Recent Results (from the past 240 hour(s))  Blood Culture (routine x 2)     Status: None (Preliminary result)   Collection Time: 03/27/19  8:36 AM   Specimen: BLOOD  Result Value Ref Range Status   Specimen Description BLOOD BLOOD LEFT FOREARM  Final   Special Requests Blood Culture adequate volume  Final   Culture   Final    NO GROWTH < 24 HOURS Performed at [facility], [address], [place], [place] [address]    Report Status PENDING  Incomplete  Blood Culture (routine x 2)     Status: None (Preliminary result)   Collection Time: 03/27/19  8:36 AM   Specimen: BLOOD  Result Value Ref Range Status   Specimen Description BLOOD BLOOD LEFT FOREARM  Final    Special Requests   Final    BOTTLES DRAWN AEROBIC AND ANAEROBIC Blood Culture adequate volume   Culture   Final    NO GROWTH < 24 HOURS Performed at [facility], [address], [place], [place] [address]    Report Status PENDING  Incomplete     Radiology Studies: DG Elbow Complete Right  Result Date: 03/27/2019 CLINICAL DATA:  Right elbow pain after fall yesterday. EXAM: RIGHT ELBOW - COMPLETE 3+ VIEW COMPARISON:  Right humerus x-rays dated September 29, 2018. FINDINGS: There is no evidence of fracture, dislocation, or joint effusion. There is no evidence of arthropathy or other focal bone abnormality. Soft tissues are unremarkable. IMPRESSION: Negative. Electronically Signed   By: Titus Dubin M.D.   On: 03/27/2019 09:07   CT Head Wo Contrast  Result Date: 03/27/2019 CLINICAL DATA:  Recent fall with headaches and neck pain, history of COVID-19 positivity EXAM: CT HEAD WITHOUT CONTRAST CT CERVICAL SPINE WITHOUT CONTRAST TECHNIQUE: Multidetector CT imaging of the head and cervical spine was performed following the standard protocol without intravenous contrast. Multiplanar CT image reconstructions of the cervical spine were also generated. COMPARISON:  11/06/2018 FINDINGS: CT HEAD FINDINGS Brain: Chronic atrophic and ischemic changes are noted. No findings to suggest acute hemorrhage, acute infarction or space-occupying mass lesion are seen. Vascular: No hyperdense vessel or unexpected calcification. Skull: Normal. Negative for fracture or focal lesion. Sinuses/Orbits: No acute finding. Other: None. CT CERVICAL SPINE FINDINGS Alignment: Within normal limits. Skull base and vertebrae: 7 cervical segments are well visualized. Multilevel osteophytic changes are noted from C2-T1. Diffuse facet hypertrophic changes are noted resulting in bilateral neural foraminal narrowing. No acute fracture or acute facet abnormality is noted. Soft tissues and spinal canal: Surrounding soft tissue structures  are within normal limits. Upper chest: Visualized lung apices are unremarkable. Other: None IMPRESSION: CT of the head: Chronic atrophic and ischemic changes without acute intracranial abnormality. CT of the cervical spine: Multilevel degenerative change without acute abnormality. Electronically Signed   By: Inez Catalina M.D.   On: 03/27/2019 09:27   CT Cervical Spine Wo Contrast  Result Date: 03/27/2019 CLINICAL DATA:  Recent fall with headaches and neck pain, history of COVID-19 positivity EXAM: CT HEAD WITHOUT CONTRAST CT CERVICAL SPINE WITHOUT CONTRAST TECHNIQUE: Multidetector CT imaging of the head and cervical spine was performed following the standard protocol without intravenous contrast. Multiplanar CT image reconstructions of the cervical spine were also generated. COMPARISON:  11/06/2018 FINDINGS: CT HEAD FINDINGS Brain: Chronic atrophic and ischemic changes are noted. No findings to suggest acute hemorrhage, acute infarction or space-occupying mass lesion are seen. Vascular: No hyperdense vessel or unexpected calcification. Skull: Normal. Negative for fracture or focal lesion. Sinuses/Orbits: No acute finding. Other: None. CT CERVICAL SPINE FINDINGS Alignment: Within normal limits. Skull base and vertebrae: 7 cervical segments are well visualized. Multilevel osteophytic changes are noted from C2-T1. Diffuse facet hypertrophic changes are noted resulting in bilateral neural foraminal narrowing. No acute fracture or acute facet abnormality is noted. Soft tissues and spinal canal: Surrounding soft tissue structures are within normal limits. Upper chest: Visualized lung apices are unremarkable. Other: None IMPRESSION: CT of the head: Chronic atrophic and ischemic changes without acute intracranial abnormality. CT of the cervical spine: Multilevel degenerative change without acute abnormality. Electronically Signed   By: Inez Catalina M.D.   On: 03/27/2019 09:27   DG Chest Port 1 View  Result Date:  03/27/2019 CLINICAL DATA:  Unwitnessed fall EXAM: PORTABLE CHEST 1 VIEW COMPARISON:  03/27/2019, 03/10/2019 FINDINGS: Mild bilateral interstitial thickening. Small left pleural effusion with left basilar atelectasis. No pneumothorax. Stable cardiomediastinal silhouette. No aggressive osseous lesion. IMPRESSION: No acute cardiopulmonary disease. Stable small left pleural effusion with left basilar atelectasis. Electronically Signed   By: Kathreen Devoid   On: 03/27/2019 12:08   DG Chest Port 1 View  Result Date: 03/27/2019 CLINICAL DATA:  Shortness of breath. EXAM: PORTABLE CHEST 1 VIEW COMPARISON:  None. FINDINGS: Stable cardiomegaly. Atherosclerosis of thoracic aorta is noted. No pneumothorax is noted. Right lung is clear. Moderate size left pleural effusion is noted with probable underlying atelectasis or infiltrate. Old right clavicular fracture is again noted. IMPRESSION: Stable moderate size left pleural effusion is noted with probable underlying atelectasis or infiltrate. Aortic Atherosclerosis (ICD10-I70.0). Electronically Signed   By: Sabino Dick  Jr M.D.   On: 03/27/2019 09:08    Scheduled Meds: . acidophilus  1 capsule Oral Daily  . albuterol  2 puff Inhalation Q6H  . vitamin C  500 mg Oral Daily  . cholecalciferol  5,000 Units Oral Daily  . clonazePAM  1 mg Oral BID  . dexamethasone (DECADRON) injection  6 mg Intravenous Q24H  . docusate sodium  100 mg Oral BID  . doxazosin  1 mg Oral Daily  . escitalopram  20 mg Oral Daily  . gabapentin  300 mg Oral QHS  . heparin  5,000 Units Subcutaneous Q8H  . isosorbide mononitrate  30 mg Oral Daily  . metoprolol tartrate  12.5 mg Oral BID  . multivitamins with iron  1 tablet Oral Daily  . pantoprazole (PROTONIX) IV  40 mg Intravenous Q24H  . pravastatin  40 mg Oral QHS  . zinc sulfate  220 mg Oral Daily   Continuous Infusions: . lactated ringers 50 mL/hr at 03/28/19 0300  . remdesivir 100 mg in NS 100 mL       LOS: 1 day   Time spent: 45  minutes.  Personally reviewed his chart.  Lorella Nimrod, MD [facility]  If 7PM-7AM, please contact night-coverage [email or web address]  03/28/2019, 7:46 AM   This record has been created using Systems analyst. Errors have been sought and corrected,but may not always be located. Such creation errors do not reflect on the standard of care.

## 2019-03-28 NOTE — Progress Notes (Signed)
4 LDAs removed for accuracy. No longer relevant to patient

## 2019-03-28 NOTE — Progress Notes (Signed)
Doxazosin, multi and lexapro unavailable at this time. Message sent to pharmacy. Awaiting meds. Will administer when available.

## 2019-03-28 NOTE — Evaluation (Signed)
Occupational Therapy Evaluation Patient Details Name: [name] MRN: [id number] DOB: 09-19-1923 Today's Date: 03/28/2019    History of Present Illness pt is 86 y/o M with PMH: HTN, [name], and AKI on CKD. Pt preseted after fall at SNF while trying to get to the bathroom and his legs gave way. Pt with c/o weakness and SOB x3 days. Pt found to be positive for COVID-19 with PNA.   Clinical Impression   Pt was seen for OT evaluation this date. Prior to hospital admission, pt was living in SNF, but unclear on how much assistance pt required. Pt reports using RW for fxl mobility primarily. Currently pt demonstrates impairments as described below (See OT problem list) which functionally limit his ability to perform ADL/self-care tasks. Pt currently requires MAX A for LB ADLs seated and MIN/MOD A for UB ADLs seated (at bed level, ADL attempts initiated, but pt too drowsy, more participatory in sitting).  Pt's sats maintain >94% throughout on RA and pt tolerates EOB sitting x7-8 minutes. Pt would benefit from skilled OT to address noted impairments and functional limitations (see below for any additional details) in order to maximize safety and independence while minimizing falls risk and caregiver burden. Upon hospital discharge, recommend SNF to maximize pt safety and return to PLOF.     Follow Up Recommendations  SNF;Supervision/Assistance - 24 hour    Equipment Recommendations  Other (comment)(defer to next level of care)    Recommendations for Other Services       Precautions / Restrictions Precautions Precautions: Fall Restrictions Weight Bearing Restrictions: No      Mobility Bed Mobility Overal bed mobility: Needs Assistance Bed Mobility: Supine to Sit;Sit to Supine     Supine to sit: Mod assist;+2 for physical assistance Sit to supine: Mod assist;+2 for physical assistance      Transfers                 General transfer comment: deferred for safety    Balance Overall  balance assessment: Needs assistance Sitting-balance support: Single extremity supported;Feet supported Sitting balance-Leahy Scale: Fair Sitting balance - Comments: initially static sitting balance is poor requiring MOD A for stability and use of UEs, but pt eventually able to sustain static sitting balance with alteranting 1 UE and no external assitance.       Standing balance comment: deferred                           ADL either performed or assessed with clinical judgement   ADL                                         General ADL Comments: Pt requires MAX to total A for bed level and seated LB ADLs including donning socks. MOD/MAX A for grooming UB at bed level, but with EOB sitting, pt becomes more alert and participatory, only requiring MIN/MOD A. Pt requires MOD A x2 for sup<>sit transition.     Vision Baseline Vision/History: Wears glasses Wears Glasses: At all times Patient Visual Report: No change from baseline Additional Comments: difficult to formally assess d/t pt confusion at this time, minimal visual attention initially, but eventually able to open and give moderate visual attention, demos appropriate tracking.     Perception     Praxis      Pertinent Vitals/Pain Pain Assessment: 0-10  Pain Score: 2  Pain Location: low back Pain Descriptors / Indicators: Aching Pain Intervention(s): Limited activity within patient's tolerance;Repositioned     Hand Dominance Right   Extremity/Trunk Assessment Upper Extremity Assessment Upper Extremity Assessment: Generalized weakness(pt completes ~1/2 arc of motion b'ly but cannot tolerate/maintain long. , grip b'ly 3+/5)   Lower Extremity Assessment Lower Extremity Assessment: Defer to PT evaluation;Generalized weakness   Cervical / Trunk Assessment Cervical / Trunk Assessment: Kyphotic(head forward/head down)   Communication Communication Communication: HOH   Cognition Arousal/Alertness:  Awake/alert Behavior During Therapy: WFL for tasks assessed/performed Overall Cognitive Status: No family/caregiver present to determine baseline cognitive functioning                                 General Comments: Pt is oriented to self only, demos drowsiness initially. Pt able to follow 1-2 step commands with increased time/tactile cues. Unsure of pt's cognitive baseline.   General Comments       Exercises Other Exercises Other Exercises: OT facilitates education re: role of OT in acute setting, pt with minimal reception of education demonstrated.   Shoulder Instructions      Home Living Family/patient expects to be discharged to:: Skilled nursing facility                                        Prior Functioning/Environment          Comments: pt is somewhat poor historian as he is confused throughout session. Does have some moments of increased lucidity and reports using RW for fxl mobility at SNF. Does endorse several falls. Pt unclear on further attempts to glean information re: PLOF and setup.        OT Problem List: Decreased strength;Decreased range of motion;Decreased activity tolerance;Impaired balance (sitting and/or standing);Decreased cognition;Decreased safety awareness;Decreased knowledge of use of DME or AE;Pain      OT Treatment/Interventions: Self-care/ADL training;Therapeutic exercise;Energy conservation;DME and/or AE instruction;Therapeutic activities;Patient/family education;Balance training    OT Goals(Current goals can be found in the care plan section) Acute Rehab OT Goals Patient Stated Goal: to feel better and more able OT Goal Formulation: With patient Time For Goal Achievement: 04/11/19 Potential to Achieve Goals: Good  OT Frequency: Min 1X/week   Barriers to D/C:            Co-evaluation              AM-PAC OT "6 Clicks" Daily Activity     Outcome Measure Help from another person eating meals?: A  Little Help from another person taking care of personal grooming?: A Little Help from another person toileting, which includes using toliet, bedpan, or urinal?: Total Help from another person bathing (including washing, rinsing, drying)?: A Lot Help from another person to put on and taking off regular upper body clothing?: A Little Help from another person to put on and taking off regular lower body clothing?: A Lot 6 Click Score: 14   End of Session    Activity Tolerance: Patient limited by fatigue Patient left: in bed;with call bell/phone within reach;with bed alarm set  OT Visit Diagnosis: Unsteadiness on feet (R26.81);Muscle weakness (generalized) (M62.81)                Time: 1102-1150 OT Time Calculation (min): 48 min Charges:  OT General Charges $OT Visit: 1 Visit OT  Evaluation $OT Eval Moderate Complexity: 1 Mod OT Treatments $Self Care/Home Management : 8-22 mins $Therapeutic Activity: 8-22 mins  Gerrianne Scale, MS, OTR/L ascom [phone] 03/28/19, 1:50 PM

## 2019-03-28 NOTE — Progress Notes (Signed)
Occupational Therapy Treatment Patient Details Name: [name] MRN: [id number] DOB: [birth year] Today's Date: 03/28/2019    History of present illness pt is 86 y/o M with PMH: HTN, [name], and AKI on CKD. Pt preseted after fall at SNF while trying to get to the bathroom and his legs gave way. Pt with c/o weakness and SOB x3 days. Pt found to be positive for COVID-19 with PNA.   OT comments  [name] was seen for OT treatment on this date. Upon arrival to pt room, pt with NT in room requesting to use the [facility] this date. NT requests assistance as pt has not performed functional mobility with therapy this date. Pt requires +2 mod/max assist +2 for functional transfer to [facility] as well as +2 max/total assist for peri-care from sit to stand. See ADL section for details. Pt requires consistent cueing t/o session for safe use of AE for functional transfer and sequencing of tasks. Pt asks "Did I do good?" once seated back at EOB after performing ADL tasks. This Pryor Curia utilizes therapeutic use of self to provide encouragement and assurance to pt t/o session. Pt endorses feeling better after being able to have a BM and returns to semi-supine in bed with supervision for safety and +2 max assist for bed boost. Pt making good progress toward goals and continues to benefit from skilled OT services to maximize return to PLOF and minimize risk of future falls, injury, caregiver burden, and readmission. Will continue to follow POC as written. Discharge recommendation remains appropriate.    Follow Up Recommendations  SNF;Supervision/Assistance - 24 hour    Equipment Recommendations  Other (comment)    Recommendations for Other Services      Precautions / Restrictions Precautions Precautions: Fall Restrictions Weight Bearing Restrictions: No       Mobility Bed Mobility Overal bed mobility: Needs Assistance Bed Mobility: Supine to Sit;Sit to Supine     Supine to sit: Supervision;HOB elevated Sit to supine:  HOB elevated;Min guard;Supervision   General bed mobility comments: Pt comes to sitting EOB with increased effort to perform and HOB elevated.  Transfers Overall transfer level: Needs assistance Equipment used: Rolling walker (2 wheeled) Transfers: Sit to/from [facility] Sit to Stand: Mod assist;Max assist;+2 physical assistance;+2 safety/equipment Stand pivot transfers: Max assist;Mod assist;+2 safety/equipment;+2 physical assistance       General transfer comment: Pt performs transfer to<>from BSC this date +2 for safety/equipment and physical assist this date.    Balance Overall balance assessment: Needs assistance Sitting-balance support: Single extremity supported;Feet supported Sitting balance-Leahy Scale: Fair Sitting balance - Comments: initially static sitting balance is poor requiring MOD A for stability and use of UEs, but pt eventually able to sustain static sitting balance with alteranting 1 UE and no external assitance.   Standing balance support: Bilateral upper extremity supported;During functional activity Standing balance-Leahy Scale: Poor Standing balance comment: Heavy BUE use on RW, pt requires cueing for upright posture and up to moderate physical assist to maintain standing balance.                           ADL either performed or assessed with clinical judgement   ADL Overall ADL's : Needs assistance/impaired                         Toilet Transfer: Maximal assistance;+2 for safety/equipment;RW;Stand-pivot;Moderate assistance Toilet Transfer Details (indicate cue type and reason): Pt requires +1  mod/max assist to SPT to [facility] this date. Pt eager to transfer to commode and NT in room to provide +2 assist for mgt of lines/leads as wells as help align [facility] for pt safety. Pt tremulous with standing, but does use RW to support standing balance with heavy cueing for safe use. When transferring to commode pt attempts to push walker  aside, reaches for [facility] hand rail to support self when sitting onto commode. Pt able to stand pivot back to bed this date. Is able to side-step at EOB with mod A for balance and safety. Toileting- Clothing Manipulation and Hygiene: Maximal assistance;+2 for safety/equipment;+2 for physical assistance;Sit to/from stand Toileting - Clothing Manipulation Details (indicate cue type and reason): Pt requires +2 max assist for mgt of toilet hygiene and peri-care this date. Pt able to stand given +1 mod assist and use of RW while NT provides +2 total assist for mgt of peri care.     Functional mobility during ADLs: +2 for physical assistance;Cueing for safety;Cueing for sequencing;Rolling walker;+2 for safety/equipment;Moderate assistance General ADL Comments: Pt requires MAX to total A for bed level and seated LB ADLs including donning socks. MOD/MAX A for grooming UB at bed level, but with EOB sitting, pt becomes more alert and participatory, only requiring MIN/MOD A. Pt requires MOD A x2 for sup<>sit transition.     Vision Baseline Vision/History: Wears glasses Wears Glasses: At all times Patient Visual Report: No change from baseline Additional Comments: Pt noted to have R eye closed t/o session with rheum surrounding upper and lower eye lashes.   Perception     Praxis      Cognition Arousal/Alertness: Lethargic Behavior During Therapy: WFL for tasks assessed/performed;Impulsive Overall Cognitive Status: No family/caregiver present to determine baseline cognitive functioning                                 General Comments: Pt mildly impulsive with movements, does not appear to recognize he is in a hospital. Complains of pulse ox monitor and requests it be removed. No family present to determine baseline.        Exercises Other Exercises Other Exercises: OT engages pt in functional transfer to/from [facility] and toileting this date. Pt educated on falls prevention strategies, safe  transfer technique, and safe use of AE for functional mobility. See ADL section for detail.   Shoulder Instructions       General Comments      Pertinent Vitals/ Pain       Pain Assessment: Faces Pain Score: 2  Faces Pain Scale: Hurts little more Pain Location: low back Pain Descriptors / Indicators: Sore Pain Intervention(s): Limited activity within patient's tolerance;Monitored during session;Repositioned  Home Living Family/patient expects to be discharged to:: Skilled nursing facility                                        Prior Functioning/Environment          Comments: pt is somewhat poor historian as he is confused throughout session. Does have some moments of increased lucidity and reports using RW for fxl mobility at SNF. Does endorse several falls. Pt unclear on further attempts to glean information re: PLOF and setup.   Frequency  Min 1X/week        Progress Toward Goals  OT Goals(current goals can now  be found in the care plan section)  Progress towards OT goals: Progressing toward goals  Acute Rehab OT Goals Patient Stated Goal: to feel better and more able OT Goal Formulation: With patient Time For Goal Achievement: 04/11/19 Potential to Achieve Goals: Good ADL Goals Pt Will Perform Grooming: with set-up;sitting(to complete 2-3 g/h tasks to improve sitting tolerance and balance for fxl tasks.) Pt Will Transfer to Toilet: with mod assist;with +2 assist;stand pivot transfer;bedside commode Pt Will Perform Toileting - Clothing Manipulation and hygiene: with mod assist;with 2+ total assist;sitting/lateral leans  Plan Discharge plan remains appropriate;Frequency remains appropriate    Co-evaluation                 AM-PAC OT "6 Clicks" Daily Activity     Outcome Measure   Help from another person eating meals?: A Little Help from another person taking care of personal grooming?: A Little Help from another person toileting, which  includes using toliet, bedpan, or urinal?: Total Help from another person bathing (including washing, rinsing, drying)?: A Lot Help from another person to put on and taking off regular upper body clothing?: A Little Help from another person to put on and taking off regular lower body clothing?: A Lot 6 Click Score: 14    End of Session Equipment Utilized During Treatment: Gait belt;Rolling walker  OT Visit Diagnosis: Unsteadiness on feet (R26.81);Muscle weakness (generalized) (M62.81)   Activity Tolerance Patient tolerated treatment well   Patient Left in bed;with call bell/phone within reach;with bed alarm set;with nursing/sitter in room(With NT in room)   Nurse Communication          Time: [id number] OT Time Calculation (min): 26 min  Charges: OT General Charges $OT Visit: 1 Visit $Self Care/Home Management : 23-37 mins  Shara Blazing, M.S., OTR/L Ascom: [phone] 03/28/19, 3:32 PM

## 2019-03-28 NOTE — Telephone Encounter (Signed)
DPR on file. Spoke with the patients wife. Advised her that an update that the patient is currently admitted at [facility] has been sent to Dr. Fletcher Anon and Ignacia Bayley, NP. Mrs. Landsiedel is wanting an update on the patients in patient status and POC. Advised her that I would not be able to provide that. Advised her that she will need to contact the hospital and ask to speak with the nurse currently taking care of the patient.  Could tell that Mrs. Heagle was crying during the conversation. Attempted to comfort her.  Mrs. Cubero is upset that her children are keeping information regarding the patients diagnosis and status from her and she is frustrated. Adv her that I am sorry for her frustration and sympathize with her. Advised her to discuss her frustration with her children.  Advised Mrs. Dimichele that the patient was not admitted with a cardiac dx and is being cared for by the Hospitalist. Recommended again that she contact [facility] for an update on the patients status and discuss with her children.  Mrs. Goudy d/c the call.

## 2019-03-28 NOTE — Progress Notes (Signed)
Awaiting documentation of HCPOA to be provided by patients son. CM/SW aware of above. Madlyn Frankel, RN

## 2019-03-28 NOTE — Telephone Encounter (Signed)
Patient wife would like a call back regarding pt medications. Patient ask we do not leave a message. Patient is currently at [facility]

## 2019-03-28 NOTE — TOC Initial Note (Signed)
Transition of Care [facility]) - Initial/Assessment Note    Patient Details  Name: [name] MRN: [id number] Date of Birth: 10-13-1923  Transition of Care [facility]) CM/SW Contact:    Shelbie Ammons, RN Phone Number: 03/28/2019, 10:45 AM  Clinical Narrative:  RNCM assessed patient's son Avishai Kapler via telephone due to patient's cognitive status and +Covid diagnosis. Marya Amsler requests that communication with family be through him due to wife's cognitive status. Marya Amsler reports that patient resides at [facility] and has lived there for the past 2 years. He does verbalize concerns over patient immediately returning to facility due to his diminished status. Discussed that therapy evaluations have been ordered and it will be up to their determination if patient will need rehab prior to returning there. Greg verbalizes understanding and reports that he is open to SNF placement if need be and requests that facilities be looked at in [facility] if none are available in [place].  Patient resides at [facility] and transportation is provided through them. He is seen by [facility] and receives his medications through their pharmacy as well.   RNCM will continue to follow.    Expected Discharge Plan: Skilled Nursing Facility Barriers to Discharge: Continued Medical Work up   Patient Goals and CMS Choice Patient states their goals for this hospitalization and ongoing recovery are:: per son for patient to get strong enough to return to ALF      Expected Discharge Plan and Services Expected Discharge Plan: [place]   Discharge Planning Services: CM Consult Post Acute Care Choice: [place] Living arrangements for the past 2 months: [place]                                      Prior Living Arrangements/Services Living arrangements for the past 2 months: [place] Lives with:: Facility Resident Patient language and  need for interpreter reviewed:: Yes        Need for Family Participation in Patient Care: Yes (Comment) Care giver support system in place?: Yes (comment)   Criminal Activity/Legal Involvement Pertinent to Current Situation/Hospitalization: No - Comment as needed  Activities of Daily Living Home Assistive Devices/Equipment: [name] (specify type) ADL Screening (condition at time of admission) Patient's cognitive ability adequate to safely complete daily activities?: Yes Is the patient deaf or have difficulty hearing?: Yes Does the patient have difficulty seeing, even when wearing glasses/contacts?: No Does the patient have difficulty concentrating, remembering, or making decisions?: Yes Patient able to express need for assistance with ADLs?: Yes Does the patient have difficulty dressing or bathing?: Yes Independently performs ADLs?: No Communication: Needs assistance Dressing (OT): Needs assistance Is this a change from baseline?: Pre-admission baseline Grooming: Needs assistance Is this a change from baseline?: Pre-admission baseline Feeding: Independent Bathing: Needs assistance Is this a change from baseline?: Pre-admission baseline Toileting: Needs assistance Is this a change from baseline?: Pre-admission baseline In/Out Bed: Needs assistance Is this a change from baseline?: Pre-admission baseline Walks in Home: Needs assistance Is this a change from baseline?: Pre-admission baseline Does the patient have difficulty walking or climbing stairs?: Yes Weakness of Legs: Both Weakness of Arms/Hands: None  Permission Sought/Granted                  Emotional Assessment           Psych Involvement: No (comment)  Admission diagnosis:  Weakness [  R53.1] Acute respiratory failure with hypoxia ([place]) [J96.01] COVID-19 virus infection [U07.1] Pneumonia due to COVID-19 virus [U07.1, J12.82] COVID-19 [U07.1] Patient Active Problem List   Diagnosis Date Noted  . Pressure  injury of skin 03/28/2019  . Acute on chronic diastolic heart failure ([place]) 03/27/2019  . Pneumonia due to COVID-19 virus 03/27/2019  . COVID-19 virus infection 03/27/2019  . Malnutrition of moderate degree 06/15/2017  . Pneumonia 06/13/2017  . 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 S/P kyphoplasty 01/14/2017  . Respiratory failure, post-operative ([place]) 12/31/2016  . Acute respiratory failure ([place]) 12/30/2016  . Altered mental status 12/07/2016  . Acute kidney injury ([place]) 09/14/2016  . Head injury, intracranial, without loss of consciousness or fracture ([place]) 09/07/2016  . Groin pain, left 06/15/2016  . Closed right hip fracture, with routine healing, subsequent encounter 04/15/2016  . Productive cough 03/23/2016  . Medicare annual wellness visit, subsequent 02/19/2016  . Major depression 10/14/2015  . Behavior disturbance 09/12/2015  . Mobility impaired 08/27/2015  . Skin ulcer of back ([place]) 07/01/2015  . Laceration of right hand 07/01/2015  . Traumatic hematoma of lower back 07/01/2015  . Encounters for administrative purpose 04/01/2015  . Skin lesion of face 12/24/2014  . Contusion of right hand 11/28/2014  . Herpes zoster 10/30/2014  . Need for immunization against influenza 10/24/2014  . Compression fracture of L4 lumbar vertebra 09/28/2014  . Rectal or anal pain 08/13/2014  . Anxiety disorder 07/23/2014  . BPH without obstruction/lower urinary tract symptoms 07/23/2014  . Atherosclerosis of coronary artery 07/23/2014  . Chronic LBP 07/23/2014  . Hypercholesteremia 07/23/2014  . Benign hypertension 07/23/2014  . Acid reflux 07/23/2014  . Chronic recurrent major depressive disorder ([place]) 07/23/2014  . GERD (gastroesophageal reflux disease) 07/23/2014  . Coronary artery disease involving native coronary artery without angina pectoris 07/06/2014  . Essential hypertension 07/06/2014  . Carotid stenosis 07/06/2014  .  Hyperlipidemia   . H/O [name] 12/20/2011   PCP:  System, Pcp Not In Pharmacy:   Ardeth Perfect, [place] [id number] [place] [address] Phone: [phone] Fax: [phone]     Social Determinants of Health (SDOH) Interventions    Readmission Risk Interventions Readmission Risk Prevention Plan 03/28/2019  Transportation Screening Complete  PCP or Specialist Appt within 3-5 Days Complete  HRI or [place] (No Data)  Social Work Consult for [place] Planning/Counseling (No Data)  Palliative Care Screening Not Applicable  Medication Review Press photographer) Complete  Some recent data might be hidden

## 2019-03-28 NOTE — Consult Note (Addendum)
[facility] Nurse Consult Note: Reason for Consult: Consult requested for right elbow.  Pt had a fall prior to admission. They are in isolation for Covid. Location was reported to have large amt bleeding last night, according to bedside nurse. Wound type: Right elbow with 2 full thickness abrasions located in close proximity and separated by a narrow strip of skin.  Entire area is 2.5X3X.2cm, wound bed with dark red clotted blood, small amt bloody drainage noted when dressing was removed.  Dressing procedure/placement/frequency: Topical treatment orders provided for bedside nurses to perform Q day as follows to reduce bleeding and promote healing: Apply alginate dressing Kellie Simmering # [phone]) to right elbow wounds Q day, then cover with 4X4s and kerlex, then ace wrap.  Moisten previous dressing with NS to remove. PLEASE NOTIFY PHYSICIAN if wound continues to bleed Please re-consult if further assistance is needed.  Thank-you,  Julien Girt MSN, [place], [place], [place], [place]

## 2019-03-29 ENCOUNTER — Inpatient Hospital Stay: Payer: Medicare Other

## 2019-03-29 LAB — CBC WITH DIFFERENTIAL/PLATELET
Abs Immature Granulocytes: 0.1 10*3/uL — ABNORMAL HIGH (ref 0.00–0.07)
Basophils Absolute: 0 10*3/uL (ref 0.0–0.1)
Basophils Relative: 0 %
Eosinophils Absolute: 0 10*3/uL (ref 0.0–0.5)
Eosinophils Relative: 0 %
HCT: 32.6 % — ABNORMAL LOW (ref 39.0–52.0)
Hemoglobin: 10.7 g/dL — ABNORMAL LOW (ref 13.0–17.0)
Immature Granulocytes: 1 %
Lymphocytes Relative: 18 %
Lymphs Abs: 1.6 10*3/uL (ref 0.7–4.0)
MCH: 29.7 pg (ref 26.0–34.0)
MCHC: 32.8 g/dL (ref 30.0–36.0)
MCV: 90.6 fL (ref 80.0–100.0)
Monocytes Absolute: 1.3 10*3/uL — ABNORMAL HIGH (ref 0.1–1.0)
Monocytes Relative: 15 %
Neutro Abs: 5.8 10*3/uL (ref 1.7–7.7)
Neutrophils Relative %: 66 %
Platelets: 145 10*3/uL — ABNORMAL LOW (ref 150–400)
RBC: 3.6 MIL/uL — ABNORMAL LOW (ref 4.22–5.81)
RDW: 14.3 % (ref 11.5–15.5)
WBC: 8.8 10*3/uL (ref 4.0–10.5)
nRBC: 0 % (ref 0.0–0.2)

## 2019-03-29 LAB — FERRITIN: Ferritin: 305 ng/mL (ref 24–336)

## 2019-03-29 LAB — COMPREHENSIVE METABOLIC PANEL
ALT: 20 U/L (ref 0–44)
AST: 61 U/L — ABNORMAL HIGH (ref 15–41)
Albumin: 3.1 g/dL — ABNORMAL LOW (ref 3.5–5.0)
Alkaline Phosphatase: 58 U/L (ref 38–126)
Anion gap: 12 (ref 5–15)
BUN: 52 mg/dL — ABNORMAL HIGH (ref 8–23)
CO2: 31 mmol/L (ref 22–32)
Calcium: 8.9 mg/dL (ref 8.9–10.3)
Chloride: 96 mmol/L — ABNORMAL LOW (ref 98–111)
Creatinine, Ser: 1.62 mg/dL — ABNORMAL HIGH (ref 0.61–1.24)
GFR calc Af Amer: 42 mL/min — ABNORMAL LOW (ref >60)
GFR calc non Af Amer: 36 mL/min — ABNORMAL LOW (ref >60)
Glucose, Bld: 102 mg/dL — ABNORMAL HIGH (ref 70–99)
Potassium: 3.3 mmol/L — ABNORMAL LOW (ref 3.5–5.1)
Sodium: 139 mmol/L (ref 135–145)
Total Bilirubin: 0.6 mg/dL (ref 0.3–1.2)
Total Protein: 7.7 g/dL (ref 6.5–8.1)

## 2019-03-29 LAB — PHOSPHORUS: Phosphorus: 2.6 mg/dL (ref 2.5–4.6)

## 2019-03-29 LAB — PROCALCITONIN: Procalcitonin: 0.14 ng/mL

## 2019-03-29 LAB — MAGNESIUM: Magnesium: 2.2 mg/dL (ref 1.7–2.4)

## 2019-03-29 LAB — FIBRIN DERIVATIVES D-DIMER (ARMC ONLY): Fibrin derivatives D-dimer (ARMC): 1335.04 ng/mL (FEU) — ABNORMAL HIGH (ref 0.00–499.00)

## 2019-03-29 LAB — C-REACTIVE PROTEIN: CRP: 6.7 mg/dL — ABNORMAL HIGH (ref <1.0)

## 2019-03-29 MED ORDER — POTASSIUM CHLORIDE CRYS ER 20 MEQ PO TBCR
40.0000 meq | EXTENDED_RELEASE_TABLET | Freq: Two times a day (BID) | ORAL | Status: AC
  Administered 2019-03-29 (×2): 40 meq via ORAL
  Filled 2019-03-29 (×2): qty 2

## 2019-03-29 MED ORDER — LIDOCAINE VISCOUS HCL 2 % MT SOLN
5.0000 mL | Freq: Three times a day (TID) | OROMUCOSAL | Status: DC
  Administered 2019-03-29 – 2019-03-31 (×3): 5 mL via OROMUCOSAL
  Filled 2019-03-29 (×4): qty 15

## 2019-03-29 MED ORDER — HYDROCODONE-ACETAMINOPHEN 5-325 MG PO TABS
1.0000 | ORAL_TABLET | Freq: Four times a day (QID) | ORAL | Status: DC | PRN
  Administered 2019-03-29 (×3): 1 via ORAL
  Administered 2019-03-30: 10:00:00 2 via ORAL
  Administered 2019-03-31: 10:00:00 1 via ORAL
  Filled 2019-03-29 (×4): qty 1
  Filled 2019-03-29: qty 2

## 2019-03-29 MED ORDER — SENNOSIDES-DOCUSATE SODIUM 8.6-50 MG PO TABS
1.0000 | ORAL_TABLET | Freq: Every day | ORAL | Status: DC
  Administered 2019-03-29 – 2019-03-30 (×2): 1 via ORAL
  Filled 2019-03-29 (×2): qty 1

## 2019-03-29 MED ORDER — MAGIC MOUTHWASH
10.0000 mL | Freq: Three times a day (TID) | ORAL | Status: DC
  Administered 2019-03-29 – 2019-03-31 (×4): 10 mL via ORAL
  Filled 2019-03-29 (×6): qty 10

## 2019-03-29 MED ORDER — MAGIC MOUTHWASH W/LIDOCAINE: 10.0000 mL | Freq: Three times a day (TID) | ORAL | Status: DC

## 2019-03-29 NOTE — TOC Progression Note (Signed)
Transition of Care [facility]) - Progression Note    Patient Details  Name: [name] MRN: [id number] Date of Birth: August 28, 1923  [facility]) CM/SW Contact  Shelbie Hutching, RN Phone Number: 03/29/2019, 3:49 PM  Clinical Narrative:    PT has recommended SNF.  Patient's son Marya Amsler is in agreement with patient going for short term rehab.  [facility] in [place] has offered a bed and patient's son chooses it for rehab.  MD plans for patient discharge on Friday.  RNCM will cont to follow and assist with discharge to facility.    Expected Discharge Plan: [place] Barriers to Discharge: Continued Medical Work up  Expected Discharge Plan and Services Expected Discharge Plan: [place]   Discharge Planning Services: CM Consult Post Acute Care Choice: [place] Living arrangements for the past 2 months: [place]                                       Social Determinants of Health (SDOH) Interventions    Readmission Risk Interventions Readmission Risk Prevention Plan 03/28/2019  Transportation Screening Complete  PCP or Specialist Appt within 3-5 Days Complete  HRI or [place] (No Data)  Social Work Consult for [place] Planning/Counseling (No Data)  Palliative Care Screening Not Applicable  Medication Review Press photographer) Complete  Some recent data might be hidden

## 2019-03-29 NOTE — Evaluation (Signed)
Physical Therapy Evaluation Patient Details Name: [name] MRN: [id number] DOB: 05-Apr-1923 Today's Date: 03/29/2019   History of Present Illness  Reynolds Shnayder is a 59yoM who comes to [facility] on 03/27/19 from [facility] SNF p 3d SOB, also having weakness, sustained a fall enroute to bathroom. BP low upon arrival. Per RN supervisor at SNF, pt tested (+) for COVID on 2/4. PMH: HTN, [name],AKI on CKD.  Clinical Impression  Pt admitted with above diagnosis. Pt currently with functional limitations due to the deficits listed below (see "PT Problem List"). Prior to session, Chief Strategy Officer contacted caregivers at [facility] for details on most recent mobility. Upon entry, pt in bed, awake and agreeable to participate. Pt requests desire to be able to AMB to BR this admission, not a big fan of using the urinal in bed. The pt is alert and oriented x4, pleasant, conversational, and generally a fair historian, provides several points of detail that are not confirmable at time of eval. Mod-minA to rise from sitting c RW (improves with repetition), pt desires to attempt AMB training, able to AMB 22ft c RW, then 54ft after seated rest. Pt agreeable to stay up to chair after session, chair alarmed. Pt does not sleep in bed at SNF d/t chronic back pain. Entire session performed on RA, 94% SpO2 or higher, pt reports chronic SOB 2/2 ABD distention/ascites. Pt pleasantly surprised at this capabilities this date, likely is fairly close to baseline. Historically, pt has not been able to AMB >9ft with PT at this site in the past 2-3 years. Pt will benefit from skilled PT intervention to increase independence and safety with basic mobility in preparation for discharge to the venue listed below.       Follow Up Recommendations SNF    Equipment Recommendations  None recommended by PT    Recommendations for Other Services       Precautions / Restrictions Precautions Precautions: Fall Restrictions Weight Bearing Restrictions:  No      Mobility  Bed Mobility Overal bed mobility: Needs Assistance Bed Mobility: Supine to Sit     Supine to sit: Min assist     General bed mobility comments: labored effort, 1 hand held assist provided  Transfers Overall transfer level: Needs assistance Equipment used: Rolling walker (2 wheeled) Transfers: Sit to/from Stand(4x in session) Sit to Stand: Mod assist;Min assist Stand pivot transfers: Min assist       General transfer comment: heavy effort, additional time trunk support for slow transition to upright.  Ambulation/Gait   Gait Distance (Feet): 12 Feet(35ft, then 12 ft; seated rest 2-3 minute between efforts) Assistive device: Rolling walker (2 wheeled)          Stairs            Wheelchair Mobility    Modified Rankin (Stroke Patients Only)       Balance Overall balance assessment: Needs assistance Sitting-balance support: No upper extremity supported;Feet supported Sitting balance-Leahy Scale: Good     Standing balance support: Bilateral upper extremity supported;During functional activity Standing balance-Leahy Scale: Fair                               Pertinent Vitals/Pain Pain Assessment: No/denies pain    Home Living Family/patient expects to be discharged to:: Skilled nursing facility                      Prior Function Level of  Independence: Needs assistance   Gait / Transfers Assistance Needed: Per SNF staff, largely nonambulatory, SPT from recliner to toilet/WC with assist from wife and RW  ADL's / Homemaking Assistance Needed: SNF resident, wife provides assist with most        Hand Dominance   Dominant Hand: Right    Extremity/Trunk Assessment   Upper Extremity Assessment Upper Extremity Assessment: Generalized weakness;Overall [facility] for tasks assessed    Lower Extremity Assessment Lower Extremity Assessment: Generalized weakness;Overall [facility] for tasks assessed    Cervical / Trunk  Assessment Cervical / Trunk Assessment: (chronic ABD distension)  Communication   Communication: HOH  Cognition Arousal/Alertness: Awake/alert Behavior During Therapy: WFL for tasks assessed/performed Overall Cognitive Status: History of cognitive impairments - at baseline                                 General Comments: most oriented, some mild difficulty with short term memory, just a litte slow processing.      General Comments      Exercises     Assessment/Plan    PT Assessment Patient needs continued PT services  PT Problem List Decreased strength;Decreased activity tolerance;Decreased mobility       PT Treatment Interventions DME instruction;Balance training;Gait training;Stair training;Functional mobility training;Therapeutic activities;Therapeutic exercise;Patient/family education    PT Goals (Current goals can be found in the Care Plan section)  Acute Rehab PT Goals Patient Stated Goal: be able to walk to the bathroom PT Goal Formulation: With patient Time For Goal Achievement: 04/12/19 Potential to Achieve Goals: Good    Frequency Min 2X/week   Barriers to discharge        Co-evaluation               AM-PAC PT "6 Clicks" Mobility  Outcome Measure Help needed turning from your back to your side while in a flat bed without using bedrails?: A Little Help needed moving from lying on your back to sitting on the side of a flat bed without using bedrails?: A Little Help needed moving to and from a bed to a chair (including a wheelchair)?: A Little Help needed standing up from a chair using your arms (e.g., wheelchair or bedside chair)?: A Little Help needed to walk in hospital room?: A Lot Help needed climbing 3-5 steps with a railing? : Total 6 Click Score: 15    End of Session Equipment Utilized During Treatment: Gait belt Activity Tolerance: Patient tolerated treatment well;Patient limited by fatigue Patient left: in chair;with chair  alarm set;with call bell/phone within reach Nurse Communication: Mobility status PT Visit Diagnosis: Unsteadiness on feet (R26.81);Difficulty in walking, not elsewhere classified (R26.2);Muscle weakness (generalized) (M62.81)    Time: 1002-1040 PT Time Calculation (min) (ACUTE ONLY): 38 min   Charges:   PT Evaluation $PT Eval Low Complexity: 1 Low PT Treatments $Therapeutic Exercise: 23-37 mins        1:06 PM, 03/29/19 Etta Grandchild, PT, DPT Physical Therapist - [facility]  [phone] ([place])    [place] 03/29/2019, 12:56 PM

## 2019-03-29 NOTE — Progress Notes (Addendum)
PROGRESS NOTE    [name]  [id number] DOB: 1923-09-28 DOA: 03/27/2019 PCP: System, Pcp Not In   Brief Narrative:  [name] is a 86 y.o. male with medical history significant of HTN, [name], CKD.  Sent to ED from his facility after having a fall and with complaint of generalized weakness and shortness of breath for the past 3 days.  Apparently tested positive for Covid at his facility on 03/23/2019 with no documentation. Labs were positive for Covid, AKI with creatinine of 2.48, chest x-ray with stable chronic left pleural effusion.  Mildly hypoxic requiring 2 L.  Admitted for COVID-19 infection and started on remdesivir and steroid.  Subjective: Pt. Was working with PT when seen today. C/O dry mouth and some pain in his mouth especially in palate area. Also c/o abdominal discomfort stating that his PCP was trying to get his fluid out but he has to come here.  Assessment & Plan:   Principal Problem:   Pneumonia due to COVID-19 virus Active Problems:   Coronary artery disease involving native coronary artery without angina pectoris   Essential hypertension   GERD (gastroesophageal reflux disease)   Acute kidney injury ([place])   Pressure injury of skin  COVID-19 infection.  Elevated inflammatory markers-now improving.  PCT 0.28 on admission, now 0.14.  No need for antibiotics at this time -Continue with remdesivir-day 3 -Continue with steroid. -Continue with vitamin C and zinc supplement. -Supplemental oxygen to keep saturation above 90%. -Supportive care with antitussive and inhalers. -Monitor inflammatory markers.  Abdominal Distension.Worsening abdominal distension. H/O malignant lymphocytic lymphoma being managed by Oncology. -Will get abdominal US and then discuss with his oncologist. -Palliative consult after talking with son for symptom management, according to him having this worsening abdominal distention for the past 64-month.  He used to follow-up at [facility] oncology with  Dr. Laurance Flatten. -No evidence of ascites on ultrasound.  Palatal Pain. Mild erythema. -Will try Magic mouth wash.  [name].  Prior echocardiogram with grade 1 diastolic dysfunction.  BNP mildly elevated at 168 with barely positive troponin most likely demand. No chest pain. -Continue with home dose of metoprolol and Imdur. -Holding diuretic due to AKI.  AKI with CKD stage IIIa.  Creatinine elevated at 2.48 on admission with baseline of 1.5-1.7. Started improving, it was 1.64 today.  Most likely at baseline. -Keep holding diuretic. -Monitor renal function. -Avoid nephrotoxic.  Right elbow Wound.  Apparently had a fall at his facility a day prior to admission.  2 large lacerations around right elbow. Wound care was consulted-appreciate their recommendations. Heparin was held as there was a continuous oozing from his lacerations. Hemoglobin dropped to 10.7 today from 12.7 on admission 2 days prior. -Continue to monitor hemoglobin. -We will restart heparin.  GERD. -Continue Protonix.  Objective: Vitals:   03/28/19 1200 03/28/19 1328 03/28/19 2115 03/29/19 0719  BP: 128/61  131/61   Pulse: 66 70 77   Resp:   20   Temp:   98 F (36.7 C) 98.3 F (36.8 C)  TempSrc:   Oral   SpO2: 99% 95% 99%   Weight:      Height:        Intake/Output Summary (Last 24 hours) at 03/29/2019 0741 Last data filed at 03/28/2019 1453 Gross per 24 hour  Intake 237.47 ml  Output 1000 ml  Net -762.53 ml   Filed Weights   03/27/19 0833  Weight: 94.8 kg    Examination:  General exam: Chronically ill-appearing elderly man, in  no acute distress.Mild palatal erythema. Respiratory system: Clear to auscultation. Respiratory effort normal. Cardiovascular system: S1 & S2 heard, RRR. No JVD, murmurs, rubs, gallops or clicks. Gastrointestinal system: Soft, distended, bowel sounds positive. Central nervous system: Alert and oriented to self only, no focal neurological deficits. Extremities: No edema, no cyanosis,  pulses intact and symmetrical. Skin: No large lacerations around right elbow with multiple ecchymosis in extremities. Psychiatry: Judgement and insight appear impaired.   DVT prophylaxis: Heparin Code Status: DNR Family Communication: Son was updated on phone.  Also requesting palliative care consult as he might not be a good candidate for aggressive measures. Disposition Plan: Pending improvement and completion of remdesivir.  He will go back to his facility.  Consultants:   None  Procedures:  Antimicrobials:   Data Reviewed: I have personally reviewed following labs and imaging studies  CBC: Recent Labs  Lab 03/27/19 0836 03/28/19 0608 03/29/19 0412  WBC 10.5 10.2 8.8  NEUTROABS 7.3 6.3 5.8  HGB 12.7* 11.1* 10.7*  HCT 39.2 34.1* 32.6*  MCV 90.5 91.2 90.6  PLT 155 136* [id number]*   Basic Metabolic Panel: Recent Labs  Lab 03/27/19 0836 03/27/19 1254 03/28/19 0608 03/29/19 0412  NA 132*  --  139 139  K 3.2*  --  3.4* 3.3*  CL 85*  --  94* 96*  CO2 31  --  32 31  GLUCOSE 148*  --  113* 102*  BUN 72*  --  62* 52*  CREATININE 2.48*  --  1.84* 1.62*  CALCIUM 9.5  --  8.9 8.9  MG  --  2.4 2.4 2.2  PHOS  --  4.1 3.8 2.6   GFR: Estimated Creatinine Clearance: 32.9 mL/min (A) (by C-G formula based on SCr of 1.62 mg/dL (H)). Liver Function Tests: Recent Labs  Lab 03/27/19 0836 03/28/19 0608 03/29/19 0412  AST 37 51* 61*  ALT 20 19 20   ALKPHOS 79 67 58  BILITOT 0.9 0.6 0.6  PROT 9.1* 7.9 7.7  ALBUMIN 3.8 3.1* 3.1*   No results for input(s): LIPASE, AMYLASE in the last 168 hours. No results for input(s): AMMONIA in the last 168 hours. Coagulation Profile: No results for input(s): INR, PROTIME in the last 168 hours. Cardiac Enzymes: No results for input(s): CKTOTAL, CKMB, CKMBINDEX, TROPONINI in the last 168 hours. BNP (last 3 results) No results for input(s): PROBNP in the last 8760 hours. HbA1C: Recent Labs    03/27/19 1254  HGBA1C 6.4*   CBG: No results  for input(s): GLUCAP in the last 168 hours. Lipid Profile: Recent Labs    03/27/19 0836  TRIG 102   Thyroid Function Tests: Recent Labs    03/27/19 1254  TSH 0.169*  FREET4 0.86   Anemia Panel: Recent Labs    03/28/19 0608 03/29/19 0412  FERRITIN 252 305   Sepsis Labs: Recent Labs  Lab 03/27/19 0836 03/27/19 1111 03/28/19 0608 03/29/19 0412  PROCALCITON 0.28  --  0.19 0.14  LATICACIDVEN 2.4* 1.6  --   --     Recent Results (from the past 240 hour(s))  Blood Culture (routine x 2)     Status: None (Preliminary result)   Collection Time: 03/27/19  8:36 AM   Specimen: BLOOD  Result Value Ref Range Status   Specimen Description BLOOD BLOOD LEFT FOREARM  Final   Special Requests Blood Culture adequate volume  Final   Culture   Final    NO GROWTH 2 DAYS Performed at [facility], [address] [place], [place] [address]    Report Status PENDING  Incomplete  Blood Culture (routine x 2)     Status: None (Preliminary result)   Collection Time: 03/27/19  8:36 AM   Specimen: BLOOD  Result Value Ref Range Status   Specimen Description BLOOD BLOOD LEFT FOREARM  Final   Special Requests   Final    BOTTLES DRAWN AEROBIC AND ANAEROBIC Blood Culture adequate volume   Culture   Final    NO GROWTH 2 DAYS Performed at [facility], [address], [place], [place] [address]    Report Status PENDING  Incomplete     Radiology Studies: DG Elbow Complete Right  Result Date: 03/27/2019 CLINICAL DATA:  Right elbow pain after fall yesterday. EXAM: RIGHT ELBOW - COMPLETE 3+ VIEW COMPARISON:  Right humerus x-rays dated September 29, 2018. FINDINGS: There is no evidence of fracture, dislocation, or joint effusion. There is no evidence of arthropathy or other focal bone abnormality. Soft tissues are unremarkable. IMPRESSION: Negative. Electronically Signed   By: Titus Dubin M.D.   On: 03/27/2019 09:07   CT Head Wo Contrast  Result Date: 03/27/2019 CLINICAL DATA:   Recent fall with headaches and neck pain, history of COVID-19 positivity EXAM: CT HEAD WITHOUT CONTRAST CT CERVICAL SPINE WITHOUT CONTRAST TECHNIQUE: Multidetector CT imaging of the head and cervical spine was performed following the standard protocol without intravenous contrast. Multiplanar CT image reconstructions of the cervical spine were also generated. COMPARISON:  11/06/2018 FINDINGS: CT HEAD FINDINGS Brain: Chronic atrophic and ischemic changes are noted. No findings to suggest acute hemorrhage, acute infarction or space-occupying mass lesion are seen. Vascular: No hyperdense vessel or unexpected calcification. Skull: Normal. Negative for fracture or focal lesion. Sinuses/Orbits: No acute finding. Other: None. CT CERVICAL SPINE FINDINGS Alignment: Within normal limits. Skull base and vertebrae: 7 cervical segments are well visualized. Multilevel osteophytic changes are noted from C2-T1. Diffuse facet hypertrophic changes are noted resulting in bilateral neural foraminal narrowing. No acute fracture or acute facet abnormality is noted. Soft tissues and spinal canal: Surrounding soft tissue structures are within normal limits. Upper chest: Visualized lung apices are unremarkable. Other: None IMPRESSION: CT of the head: Chronic atrophic and ischemic changes without acute intracranial abnormality. CT of the cervical spine: Multilevel degenerative change without acute abnormality. Electronically Signed   By: Inez Catalina M.D.   On: 03/27/2019 09:27   CT Cervical Spine Wo Contrast  Result Date: 03/27/2019 CLINICAL DATA:  Recent fall with headaches and neck pain, history of COVID-19 positivity EXAM: CT HEAD WITHOUT CONTRAST CT CERVICAL SPINE WITHOUT CONTRAST TECHNIQUE: Multidetector CT imaging of the head and cervical spine was performed following the standard protocol without intravenous contrast. Multiplanar CT image reconstructions of the cervical spine were also generated. COMPARISON:  11/06/2018 FINDINGS:  CT HEAD FINDINGS Brain: Chronic atrophic and ischemic changes are noted. No findings to suggest acute hemorrhage, acute infarction or space-occupying mass lesion are seen. Vascular: No hyperdense vessel or unexpected calcification. Skull: Normal. Negative for fracture or focal lesion. Sinuses/Orbits: No acute finding. Other: None. CT CERVICAL SPINE FINDINGS Alignment: Within normal limits. Skull base and vertebrae: 7 cervical segments are well visualized. Multilevel osteophytic changes are noted from C2-T1. Diffuse facet hypertrophic changes are noted resulting in bilateral neural foraminal narrowing. No acute fracture or acute facet abnormality is noted. Soft tissues and spinal canal: Surrounding soft tissue structures are within normal limits. Upper chest: Visualized lung apices are unremarkable. Other: None IMPRESSION: CT of the head: Chronic atrophic  and ischemic changes without acute intracranial abnormality. CT of the cervical spine: Multilevel degenerative change without acute abnormality. Electronically Signed   By: Inez Catalina M.D.   On: 03/27/2019 09:27   DG Chest Port 1 View  Result Date: 03/27/2019 CLINICAL DATA:  Unwitnessed fall EXAM: PORTABLE CHEST 1 VIEW COMPARISON:  03/27/2019, 03/10/2019 FINDINGS: Mild bilateral interstitial thickening. Small left pleural effusion with left basilar atelectasis. No pneumothorax. Stable cardiomediastinal silhouette. No aggressive osseous lesion. IMPRESSION: No acute cardiopulmonary disease. Stable small left pleural effusion with left basilar atelectasis. Electronically Signed   By: Kathreen Devoid   On: 03/27/2019 12:08   DG Chest Port 1 View  Result Date: 03/27/2019 CLINICAL DATA:  Shortness of breath. EXAM: PORTABLE CHEST 1 VIEW COMPARISON:  None. FINDINGS: Stable cardiomegaly. Atherosclerosis of thoracic aorta is noted. No pneumothorax is noted. Right lung is clear. Moderate size left pleural effusion is noted with probable underlying atelectasis or  infiltrate. Old right clavicular fracture is again noted. IMPRESSION: Stable moderate size left pleural effusion is noted with probable underlying atelectasis or infiltrate. Aortic Atherosclerosis (ICD10-I70.0). Electronically Signed   By: Marijo Conception M.D.   On: 03/27/2019 09:08    Scheduled Meds: . acidophilus  1 capsule Oral Daily  . albuterol  2 puff Inhalation Q6H  . vitamin C  500 mg Oral Daily  . cholecalciferol  5,000 Units Oral Daily  . clonazePAM  1 mg Oral BID  . dexamethasone (DECADRON) injection  6 mg Intravenous Q24H  . docusate sodium  100 mg Oral BID  . doxazosin  1 mg Oral Daily  . escitalopram  20 mg Oral Daily  . gabapentin  300 mg Oral QHS  . heparin  5,000 Units Subcutaneous Q8H  . isosorbide mononitrate  30 mg Oral Daily  . metoprolol tartrate  12.5 mg Oral BID  . multivitamins with iron  1 tablet Oral Daily  . pantoprazole (PROTONIX) IV  40 mg Intravenous Q24H  . pravastatin  40 mg Oral QHS  . zinc sulfate  220 mg Oral Daily   Continuous Infusions: . remdesivir 100 mg in NS 100 mL 100 mg (03/28/19 0830)     LOS: 2 days   Time spent: 45 minutes.    Lorella Nimrod, MD [facility]  If 7PM-7AM, please contact night-coverage [email or web address]  03/29/2019, 7:41 AM   This record has been created using Systems analyst. Errors have been sought and corrected,but may not always be located. Such creation errors do not reflect on the standard of care.

## 2019-03-29 NOTE — NC FL2 (Signed)
[place] LEVEL OF CARE SCREENING TOOL     IDENTIFICATION  Patient Name: [name] Birthdate: Jul 03, 1923 Sex: male Admission Date (Current Location): 03/27/2019  [place] and [place] Number:  Engineering geologist and Address:  [facility], [address] [place], [place] [address]      Provider Number: [id number]  Attending Physician Name and Address:  Lorella Nimrod, MD  Relative Name and Phone Number:  Ludwin Quickle (son) [phone]    Current Level of Care: Hospital Recommended Level of Care: [place] Prior Approval Number:    Date Approved/Denied:   PASRR Number: [id number]  Discharge Plan: SNF    Current Diagnoses: Patient Active Problem List   Diagnosis Date Noted  . Pressure injury of skin 03/28/2019  . Acute on chronic diastolic heart failure ([place]) 03/27/2019  . Pneumonia due to COVID-19 virus 03/27/2019  . COVID-19 virus infection 03/27/2019  . Malnutrition of moderate degree 06/15/2017  . Pneumonia 06/13/2017  . 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 S/P kyphoplasty 01/14/2017  . Respiratory failure, post-operative ([place]) 12/31/2016  . Acute respiratory failure ([place]) 12/30/2016  . Altered mental status 12/07/2016  . Acute kidney injury ([place]) 09/14/2016  . Head injury, intracranial, without loss of consciousness or fracture ([place]) 09/07/2016  . Groin pain, left 06/15/2016  . Closed right hip fracture, with routine healing, subsequent encounter 04/15/2016  . Productive cough 03/23/2016  . Medicare annual wellness visit, subsequent 02/19/2016  . Major depression 10/14/2015  . Behavior disturbance 09/12/2015  . Mobility impaired 08/27/2015  . Skin ulcer of back ([place]) 07/01/2015  . Laceration of right hand 07/01/2015  . Traumatic hematoma of lower back 07/01/2015  . Encounters for administrative purpose 04/01/2015  . Skin lesion of face 12/24/2014  .  Contusion of right hand 11/28/2014  . Herpes zoster 10/30/2014  . Need for immunization against influenza 10/24/2014  . Compression fracture of L4 lumbar vertebra 09/28/2014  . Rectal or anal pain 08/13/2014  . Anxiety disorder 07/23/2014  . BPH without obstruction/lower urinary tract symptoms 07/23/2014  . Atherosclerosis of coronary artery 07/23/2014  . Chronic LBP 07/23/2014  . Hypercholesteremia 07/23/2014  . Benign hypertension 07/23/2014  . Acid reflux 07/23/2014  . Chronic recurrent major depressive disorder ([place]) 07/23/2014  . GERD (gastroesophageal reflux disease) 07/23/2014  . Coronary artery disease involving native coronary artery without angina pectoris 07/06/2014  . Essential hypertension 07/06/2014  . Carotid stenosis 07/06/2014  . Hyperlipidemia   . H/O [name] 12/20/2011    Orientation RESPIRATION BLADDER Height & Weight     Self, Place, Time, Situation  Normal Continent Weight: 94.8 kg Height:  5\' 10"  (177.8 cm)  BEHAVIORAL SYMPTOMS/MOOD NEUROLOGICAL BOWEL NUTRITION STATUS      Continent Diet(Full liquid)  AMBULATORY STATUS COMMUNICATION OF NEEDS Skin   Limited Assist Verbally PU Stage and Appropriate Care(foam dressing sacrum) PU Stage 1 Dressing: Daily                     Personal Care Assistance Level of Assistance  Bathing, Feeding, Dressing Bathing Assistance: Limited assistance Feeding assistance: Independent Dressing Assistance: Limited assistance     Functional Limitations Info  Hearing   Hearing Info: Impaired      SPECIAL CARE FACTORS FREQUENCY  PT (By licensed PT), OT (By licensed OT)     PT Frequency: 5 times per week OT Frequency: 5 times per week  Contractures Contractures Info: Not present    Additional Factors Info  Code Status, Allergies Code Status Info: DNR Allergies Info: Cyclobenzaprine, sulfa, baclofen, nsaids, sulfa, tolmetin           Current Medications (03/29/2019):  This is the current  hospital active medication list Current Facility-Administered Medications  Medication Dose Route Frequency Provider Last Rate Last Admin  . acidophilus (RISAQUAD) capsule 1 capsule  1 capsule Oral Daily Florina Ou V, MD   1 capsule at 03/29/19 0930  . albuterol (VENTOLIN HFA) 108 (90 Base) MCG/ACT inhaler 2 puff  2 puff Inhalation Q6H Para Skeans, MD   2 puff at 03/29/19 1411  . alum & mag hydroxide-simeth (MAALOX/MYLANTA) 200-200-20 MG/5ML suspension 30 mL  30 mL Oral Q6H PRN Para Skeans, MD   30 mL at 03/29/19 1529  . antiseptic oral rinse (BIOTENE) solution 15 mL  15 mL Mouth Rinse BID PRN Para Skeans, MD      . ascorbic acid (VITAMIN C) tablet 500 mg  500 mg Oral Daily Para Skeans, MD   500 mg at 03/29/19 0807  . cholecalciferol (VITAMIN D) tablet 5,000 Units  5,000 Units Oral Daily Para Skeans, MD   5,000 Units at 03/29/19 [phone]  . clonazePAM (KLONOPIN) tablet 1 mg  1 mg Oral BID Para Skeans, MD   1 mg at 03/29/19 0751  . dexamethasone (DECADRON) injection 6 mg  6 mg Intravenous Q24H Para Skeans, MD   6 mg at 03/29/19 0754  . docusate sodium (COLACE) capsule 100 mg  100 mg Oral BID Para Skeans, MD   100 mg at 03/29/19 0807  . doxazosin (CARDURA) tablet 1 mg  1 mg Oral Daily Para Skeans, MD   1 mg at 03/29/19 0931  . escitalopram (LEXAPRO) tablet 20 mg  20 mg Oral Daily Para Skeans, MD   20 mg at 03/29/19 0930  . gabapentin (NEURONTIN) capsule 300 mg  300 mg Oral QHS Para Skeans, MD   300 mg at 03/28/19 2108  . guaiFENesin (ROBITUSSIN) 100 MG/5ML solution 200 mg  200 mg Oral Q6H PRN Para Skeans, MD      . heparin injection 5,000 Units  5,000 Units Subcutaneous Q8H Para Skeans, MD   5,000 Units at 03/29/19 1407  . HYDROcodone-acetaminophen (NORCO/VICODIN) 5-325 MG per tablet 1-2 tablet  1-2 tablet Oral Q6H PRN Lorella Nimrod, MD   1 tablet at 03/29/19 0752  . isosorbide mononitrate (IMDUR) 24 hr tablet 30 mg  30 mg Oral Daily Para Skeans, MD   30 mg at 03/29/19 0806   . magic mouthwash  10 mL Oral TID Pernell Dupre, RPH   10 mL at 03/29/19 1215   And  . lidocaine (XYLOCAINE) 2 % viscous mouth solution 5 mL  5 mL Mouth/Throat TID Hallaji, Sheema M, RPH   5 mL at 03/29/19 1413  . magic mouthwash  15 mL Oral QID PRN Para Skeans, MD      . metoprolol tartrate (LOPRESSOR) tablet 12.5 mg  12.5 mg Oral BID Para Skeans, MD   12.5 mg at 03/29/19 0806  . multivitamins with iron tablet 1 tablet  1 tablet Oral Daily Para Skeans, MD   1 tablet at 03/29/19 0931  . ondansetron (ZOFRAN) tablet 4 mg  4 mg Oral Q6H PRN Para Skeans, MD      . pantoprazole (PROTONIX) injection 40 mg  40 mg  Intravenous Q24H Para Skeans, MD   40 mg at 03/29/19 1404  . polyethylene glycol (MIRALAX / GLYCOLAX) packet 17 g  17 g Oral Daily PRN Florina Ou V, MD      . potassium chloride SA (KLOR-CON) CR tablet 40 mEq  40 mEq Oral BID Lorella Nimrod, MD   40 mEq at 03/29/19 1411  . pravastatin (PRAVACHOL) tablet 40 mg  40 mg Oral QHS Para Skeans, MD   40 mg at 03/28/19 2108  . remdesivir 100 mg in sodium chloride 0.9 % 100 mL IVPB  100 mg Intravenous Daily Florina Ou V, MD 200 mL/hr at 03/29/19 0806 100 mg at 03/29/19 0806  . senna-docusate (Senokot-S) tablet 1 tablet  1 tablet Oral QHS Lorella Nimrod, MD      . traMADol (ULTRAM) tablet 50 mg  50 mg Oral Q8H PRN Para Skeans, MD   50 mg at 03/29/19 0217  . zinc sulfate capsule 220 mg  220 mg Oral Daily Para Skeans, MD   220 mg at 03/29/19 0750     Discharge Medications: Please see discharge summary for a list of discharge medications.  Relevant Imaging Results:  Relevant Lab Results:   Additional Information SSN: [id number]  Shelbie Hutching, RN

## 2019-03-30 DIAGNOSIS — Z7189 Other specified counseling: Secondary | ICD-10-CM

## 2019-03-30 LAB — COMPREHENSIVE METABOLIC PANEL
ALT: 23 U/L (ref 0–44)
AST: 61 U/L — ABNORMAL HIGH (ref 15–41)
Albumin: 3.2 g/dL — ABNORMAL LOW (ref 3.5–5.0)
Alkaline Phosphatase: 62 U/L (ref 38–126)
Anion gap: 10 (ref 5–15)
BUN: 49 mg/dL — ABNORMAL HIGH (ref 8–23)
CO2: 32 mmol/L (ref 22–32)
Calcium: 9.1 mg/dL (ref 8.9–10.3)
Chloride: 99 mmol/L (ref 98–111)
Creatinine, Ser: 1.48 mg/dL — ABNORMAL HIGH (ref 0.61–1.24)
GFR calc Af Amer: 47 mL/min — ABNORMAL LOW (ref >60)
GFR calc non Af Amer: 40 mL/min — ABNORMAL LOW (ref >60)
Glucose, Bld: 112 mg/dL — ABNORMAL HIGH (ref 70–99)
Potassium: 4.5 mmol/L (ref 3.5–5.1)
Sodium: 141 mmol/L (ref 135–145)
Total Bilirubin: 0.8 mg/dL (ref 0.3–1.2)
Total Protein: 7.7 g/dL (ref 6.5–8.1)

## 2019-03-30 LAB — CBC WITH DIFFERENTIAL/PLATELET
Abs Immature Granulocytes: 0.09 10*3/uL — ABNORMAL HIGH (ref 0.00–0.07)
Basophils Absolute: 0 10*3/uL (ref 0.0–0.1)
Basophils Relative: 0 %
Eosinophils Absolute: 0 10*3/uL (ref 0.0–0.5)
Eosinophils Relative: 0 %
HCT: 35.4 % — ABNORMAL LOW (ref 39.0–52.0)
Hemoglobin: 11.5 g/dL — ABNORMAL LOW (ref 13.0–17.0)
Immature Granulocytes: 1 %
Lymphocytes Relative: 17 %
Lymphs Abs: 1.6 10*3/uL (ref 0.7–4.0)
MCH: 29.6 pg (ref 26.0–34.0)
MCHC: 32.5 g/dL (ref 30.0–36.0)
MCV: 91 fL (ref 80.0–100.0)
Monocytes Absolute: 1 10*3/uL (ref 0.1–1.0)
Monocytes Relative: 10 %
Neutro Abs: 6.9 10*3/uL (ref 1.7–7.7)
Neutrophils Relative %: 72 %
Platelets: 160 10*3/uL (ref 150–400)
RBC: 3.89 MIL/uL — ABNORMAL LOW (ref 4.22–5.81)
RDW: 14.4 % (ref 11.5–15.5)
WBC: 9.6 10*3/uL (ref 4.0–10.5)
nRBC: 0 % (ref 0.0–0.2)

## 2019-03-30 LAB — PHOSPHORUS: Phosphorus: 2 mg/dL — ABNORMAL LOW (ref 2.5–4.6)

## 2019-03-30 LAB — MAGNESIUM: Magnesium: 2.1 mg/dL (ref 1.7–2.4)

## 2019-03-30 LAB — FERRITIN: Ferritin: 243 ng/mL (ref 24–336)

## 2019-03-30 LAB — FIBRIN DERIVATIVES D-DIMER (ARMC ONLY): Fibrin derivatives D-dimer (ARMC): 1196.41 ng/mL (FEU) — ABNORMAL HIGH (ref 0.00–499.00)

## 2019-03-30 LAB — PROCALCITONIN: Procalcitonin: 0.15 ng/mL

## 2019-03-30 LAB — C-REACTIVE PROTEIN: CRP: 3.4 mg/dL — ABNORMAL HIGH (ref <1.0)

## 2019-03-30 MED ORDER — LORAZEPAM 2 MG/ML IJ SOLN
0.5000 mg | Freq: Four times a day (QID) | INTRAMUSCULAR | Status: DC | PRN
  Administered 2019-03-30: 0.5 mg via INTRAVENOUS
  Filled 2019-03-30: qty 1

## 2019-03-30 MED ORDER — SODIUM PHOSPHATES 45 MMOLE/15ML IV SOLN
30.0000 mmol | Freq: Once | INTRAVENOUS | Status: AC
  Administered 2019-03-30: 11:00:00 30 mmol via INTRAVENOUS
  Filled 2019-03-30: qty 10

## 2019-03-30 NOTE — Progress Notes (Signed)
Physical Therapy Treatment Patient Details Name: [name] MRN: [id number] DOB: Jun 27, 1923 Today's Date: 03/30/2019    History of Present Illness [name] is a 63yoM who comes to [facility] on 03/27/19 from [facility]F p 3d SOB, also having weakness, sustained a fall enroute to bathroom. BP low upon arrival. Per RN supervisor at SNF, pt tested (+) for COVID on 2/4. PMH: HTN, [name],AKI on CKD.    PT Comments    Pt was alert and oriented x 2 upon arriving. He is HOH and requires constant repetition to respond to desired questions/task. Pt was on rm air throughout session with only one de-sat to 88% that quickly recovered to mid 90s with breathing techniques. He was able to exit L side of bed with increased time and +1 min assist. He then stood and ambulated 150 ft in hallway with CGA + RW + gait belt. Tolerated ambulation well and was repositioned back in bed post session with lunch tray placed in front of him. Pt continues to progress well with PT and will continue to be followed per POC.  Pt would benefit from SNF at d/c to help pt return to PLOF.     Follow Up Recommendations  SNF     Equipment Recommendations  None recommended by PT    Recommendations for Other Services       Precautions / Restrictions Precautions Precautions: Fall Restrictions Weight Bearing Restrictions: No    Mobility  Bed Mobility Overal bed mobility: Needs Assistance Bed Mobility: Supine to Sit     Supine to sit: Min assist Sit to supine: Min guard;HOB elevated(HOB only elevated ~ 15-20 degrees. )   General bed mobility comments: Pt was able to exit L side of bed with increased time and vcs for "log roll" technique to protect back. Pt required min assist to achieve EOB sitting. Pt was on rm air throughout session with O2 desat to 88 but with PLB quickly recovers to mid 90s.  Transfers Overall transfer level: Needs assistance Equipment used: Rolling walker (2 wheeled) Transfers: Sit to/from  Stand Sit to Stand: Min assist;From elevated surface(only slightly elevated surface height)         General transfer comment: Pt stood EOB with Min assist + vcs for proper handplacement and overall technique. Bed height slightly elevated. Pt demonstarted safe ability to sit with eccentric control + CGA.  Ambulation/Gait Ambulation/Gait assistance: Min guard Gait Distance (Feet): 150 Feet Assistive device: Rolling walker (2 wheeled) Gait Pattern/deviations: Shuffle;Step-through pattern;Narrow base of support;Trunk flexed Gait velocity: decreased   General Gait Details: Pt was able to ambulate 150 ft with RW + gait belt. He tends to ambulate with narrow BOS and flexed posture. Vcs for posture correction and breathing techniques. He de-sats to 88 but quickly recovers to mid 90s with breathing techniques. Overall tolerated gait training well   Stairs             Wheelchair Mobility    Modified Rankin (Stroke Patients Only)       Balance Overall balance assessment: Needs assistance Sitting-balance support: No upper extremity supported;Feet supported Sitting balance-Leahy Scale: Good Sitting balance - Comments: Pt was able to maintain balance seated EOB with CGA for safety   Standing balance support: Bilateral upper extremity supported;During functional activity Standing balance-Leahy Scale: Fair Standing balance comment: Pt leans heavily on RW. gait belt for safety throughout. Vcs to improve/increase BOS to improve balance in standing. CGA-Min assist for dynamic standing activities  Cognition Arousal/Alertness: Awake/alert Behavior During Therapy: WFL for tasks assessed/performed Overall Cognitive Status: History of cognitive impairments - at baseline                                 General Comments: Pt is alert and O x 2. He could follow commands well but is HOH in which required constant reassurance      Exercises       General Comments        Pertinent Vitals/Pain Pain Assessment: No/denies pain Pain Score: 0-No pain Faces Pain Scale: No hurt Pain Intervention(s): Limited activity within patient's tolerance;Repositioned    Home Living                      Prior Function            PT Goals (current goals can now be found in the care plan section) Acute Rehab PT Goals Patient Stated Goal: I want to be able to walk around on my own Progress towards PT goals: Progressing toward goals    Frequency    Min 2X/week      PT Plan Current plan remains appropriate    Co-evaluation              AM-PAC PT "6 Clicks" Mobility   Outcome Measure  Help needed turning from your back to your side while in a flat bed without using bedrails?: A Little Help needed moving from lying on your back to sitting on the side of a flat bed without using bedrails?: A Little Help needed moving to and from a bed to a chair (including a wheelchair)?: A Little Help needed standing up from a chair using your arms (e.g., wheelchair or bedside chair)?: A Little Help needed to walk in hospital room?: A Lot Help needed climbing 3-5 steps with a railing? : A Lot 6 Click Score: 16    End of Session Equipment Utilized During Treatment: Gait belt Activity Tolerance: Patient tolerated treatment well Patient left: in bed;with call bell/phone within reach;with bed alarm set Nurse Communication: Mobility status PT Visit Diagnosis: Unsteadiness on feet (R26.81);Difficulty in walking, not elsewhere classified (R26.2);Muscle weakness (generalized) (M62.81)     Time: [id number] PT Time Calculation (min) (ACUTE ONLY): 27 min  Charges:  $Gait Training: 8-22 mins $Therapeutic Activity: 8-22 mins                     Julaine Fusi PTA 03/30/19, 12:50 PM

## 2019-03-30 NOTE — Discharge Summary (Addendum)
Physician Discharge Summary  [name] [id number] DOB: [birth year] DOA: 03/27/2019  PCP: System, Pcp Not In  Admit date: 03/27/2019 Discharge date: 03/31/2019  Admitted From: ALF Disposition:  SNF  Recommendations for Outpatient Follow-up:  1. Follow up with PCP in 1-2 weeks 2. Please obtain BMP/CBC in one week 3. Please follow up on the following pending results: None 4. Patient to be followed by palliative care as outpatient   Home Health: No Equipment/Devices: None Discharge Condition: Stable CODE STATUS: DNR Diet recommendation: Heart Healthy   Brief/Interim Summary: [name] a 86 y.o.malewith medical history significant ofHTN, [name], CKD.  Sent to ED from his facility after having a fall and with complaint of generalized weakness and shortness of breath for the past 3 days.  Apparently tested positive for Covid at his facility on 03/23/2019 with no documentation. Labs were positive for Covid, AKI with creatinine of 2.48, chest x-ray with stable chronic left pleural effusion.  Mildly hypoxic requiring 2 L.  Admitted for COVID-19 infection and started on remdesivir and steroid.  He completed the course of remdesivir and will continue with steroid for 5 more days.  His saturation improves and he was saturating well on room air for most of the time.  He initially had markedly elevated inflammatory markers which were improving on discharge.  Patient live at an assisted living [facility] with his wife and being discharged to SNF according to PT recommendation and talking with son to get some rehab before returning home. He will also continue with supportive care with vitamin C and zinc supplement along with some antitussives as needed.  There was some concern of worsening abdominal distention.  Patient has an history of malignant lymphocytic lymphoma which is being managed by oncology and they are currently doing just surveillance.  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  He is being followed by [facility] oncology with Dr. Laurance Flatten.  Abdominal ultrasound was done with a concern of ascites to his disease progression but ultrasound was negative for any ascites. Palliative care was also consulted for symptom management at son's request.  He did developed some AKI with CKD stage IIIa, creatinine peaked at 2.48 and then improved to 1.48, holding his home dose of diuretics. He can resume his diuretic on discharge as needed.  No concern of volume overload during current hospitalization.  Prior echogram with grade 1 diastolic dysfunction.  He apparently had a fall at his assisted living facility a day prior to admission resulted in 2 large lacerations around right elbow.  There was a continuous oozing from his lacerations and a drop in his hemoglobin to 10.7, we held his Heparin initially due to concern of bleeding.  Which was resumed once bleeding stopped.  Hemoglobin was stable around 11.5 on discharge.  Discharge Diagnoses:  Principal Problem:   Pneumonia due to COVID-19 virus Active Problems:   Coronary artery disease involving native coronary artery without angina pectoris   Essential hypertension   GERD (gastroesophageal reflux disease)   Acute kidney injury ([place])   COVID-19   Pressure injury of skin   Advanced care planning/counseling discussion   Goals of care, counseling/discussion    Discharge Instructions  Discharge Instructions    Diet - low sodium heart healthy   Complete by: As directed    Increase activity slowly   Complete by: As directed      Allergies as of 03/31/2019      Reactions  Cyclobenzaprine Other (See Comments)   Hallucination. Mental instability.  DO NOT GIVE ANY MUSCLE RELAXANTS   Other Other (See Comments)   Sulfamethoxazole Other (See Comments)   Baclofen Other (See Comments)   Confusion and weakness   Nsaids    Avoids due to liver.   Sulfamethoxazole-trimethoprim Nausea Only, Other (See  Comments)   Patient unaware of this as an allergy   Tolmetin Other (See Comments)   Avoids due to liver.it is an nsaid      Medication List    STOP taking these medications   magnesium hydroxide 400 MG/5ML suspension Commonly known as: MILK OF MAGNESIA   metolazone 2.5 MG tablet Commonly known as: ZAROXOLYN     TAKE these medications   acetaminophen 500 MG tablet Commonly known as: TYLENOL Take 1,000 mg by mouth 3 (three) times daily.   acetaminophen 500 MG tablet Commonly known as: TYLENOL Take 500 mg by mouth every 6 (six) hours as needed for mild pain or fever.   acidophilus Caps capsule Take 1 capsule by mouth daily.   albuterol 108 (90 Base) MCG/ACT inhaler Commonly known as: VENTOLIN HFA Inhale 2 puffs into the lungs every 6 (six) hours.   antiseptic oral rinse Liqd 15 mLs by Mouth Rinse route 2 (two) times daily as needed for dry mouth.   ascorbic acid 500 MG tablet Commonly known as: VITAMIN C Take 1 tablet (500 mg total) by mouth daily.   Aspercreme Lidocaine 4 % Ptch Generic drug: Lidocaine Apply 1 patch topically 2 (two) times daily.   clonazePAM 1 MG tablet Commonly known as: KLONOPIN Take 1 mg by mouth 2 (two) times daily.   dexamethasone 6 MG tablet Commonly known as: DECADRON Take 1 tablet (6 mg total) by mouth daily for 5 days.   diclofenac sodium 1 % Gel Commonly known as: VOLTAREN Apply 2 g topically 2 (two) times daily as needed (lower back pain). (use when lidocaine patch is off)   docusate sodium 100 MG capsule Commonly known as: COLACE Take 100 mg by mouth 2 (two) times daily.   doxazosin 1 MG tablet Commonly known as: CARDURA Take 1 tablet (1 mg total) by mouth daily. What changed: when to take this   escitalopram 20 MG tablet Commonly known as: LEXAPRO Take 20 mg by mouth daily.   gabapentin 300 MG capsule Commonly known as: NEURONTIN Take 300 mg by mouth at bedtime.   guaiFENesin 100 MG/5ML liquid Commonly known as:  ROBITUSSIN Take 200 mg by mouth every 6 (six) hours as needed for cough.   hydrocortisone 2.5 % cream Apply 1 application topically every other day. (apply to scalp and alternate with ketoconazole)   isosorbide mononitrate 30 MG 24 hr tablet Commonly known as: IMDUR Take 1 tablet (30 mg total) by mouth daily.   ketoconazole 2 % cream Commonly known as: NIZORAL Apply 1 application topically every other day. (apply to scalp and alternate with hydrocortisone)   lidocaine 2 % solution Commonly known as: XYLOCAINE Use as directed 5 mLs in the mouth or throat 3 (three) times daily.   loperamide 2 MG capsule Commonly known as: IMODIUM Take 2 mg by mouth as needed for diarrhea or loose stools. (max 8 doses in 24 hours)   Lutein 10 MG Tabs Take 10 mg by mouth 2 (two) times daily.   magic mouthwash Soln Take 10 mLs by mouth 3 (three) times daily as needed for mouth pain. What changed:   how much to take  when  to take this   Melatonin 5 MG Tabs Take 5 mg by mouth at bedtime as needed (sleep).   metoprolol tartrate 25 MG tablet Commonly known as: LOPRESSOR Take 12.5 mg by mouth 2 (two) times daily.   Mintox [id number] MG/5ML suspension Generic drug: alum & mag hydroxide-simeth Take 30 mLs by mouth every 6 (six) hours as needed for indigestion or heartburn.   multivitamins with iron Tabs tablet Take 1 tablet by mouth daily.   neomycin-bacitracin-polymyxin ointment Commonly known as: NEOSPORIN Apply 1 application topically as needed for wound care.   nitroGLYCERIN 0.4 MG SL tablet Commonly known as: NITROSTAT Place 0.4 mg under the tongue every 5 (five) minutes as needed for chest pain.   ondansetron 4 MG tablet Commonly known as: ZOFRAN Take 4 mg by mouth every 6 (six) hours as needed for nausea or vomiting.   pantoprazole 40 MG tablet Commonly known as: PROTONIX Take 1 tablet (40 mg total) by mouth daily.   polyethylene glycol 17 g packet Commonly known as: MIRALAX  / GLYCOLAX Take 17 g by mouth daily as needed.   pravastatin 40 MG tablet Commonly known as: PRAVACHOL Take 40 mg by mouth at bedtime.   senna 8.6 MG Tabs tablet Commonly known as: SENOKOT Take 1 tablet by mouth at bedtime.   torsemide 20 MG tablet Commonly known as: DEMADEX Take 1 tablet (20 mg total) by mouth daily. Take 1 tablet (20 mg) by mouth once a day on Mondays/ Wednesdays/ & Fridays What changed: additional instructions   traMADol 50 MG tablet Commonly known as: Ultram Take 1 tablet (50 mg total) by mouth every 8 (eight) hours as needed. Patient to carry to his room What changed:   when to take this  additional instructions   triamcinolone cream 0.1 % Commonly known as: KENALOG Apply 1 application topically 2 (two) times daily.   Vitamin D3 125 MCG (5000 UT) Caps Take 5,000 Units by mouth daily.   zinc sulfate 220 (50 Zn) MG capsule Take 1 capsule (220 mg total) by mouth daily.      Contact information for after-discharge care    [facility] .   Service: Skilled Nursing Contact information: [place] [address] [phone]             Allergies  Allergen Reactions  . Cyclobenzaprine Other (See Comments)    Hallucination. Mental instability.  DO NOT GIVE ANY MUSCLE RELAXANTS  . Other Other (See Comments)  . Sulfamethoxazole Other (See Comments)  . Baclofen Other (See Comments)    Confusion and weakness  . Nsaids     Avoids due to liver.  . Sulfamethoxazole-Trimethoprim Nausea Only and Other (See Comments)    Patient unaware of this as an allergy  . Tolmetin Other (See Comments)    Avoids due to liver.it is an nsaid    Consultations:    Procedures/Studies: DG Chest 2 View  Result Date: 03/10/2019 CLINICAL DATA:  Left pleural effusion. EXAM: CHEST - 2 VIEW COMPARISON:  Chest x-ray dated 12/14/2017 and chest CT dated 02/19/2016 FINDINGS: There is a chronic left pleural effusion  which is loculated laterally and posteriorly as demonstrated on the prior chest CT. Aortic atherosclerosis. Pulmonary vascularity is normal. Right lung is clear. Old compression fracture in the lower thoracic spine treated with kyphoplasty. No acute bone abnormality. IMPRESSION: Chronic loculated left pleural effusion. No significant change since 12/14/2017. Electronically Signed   By: Lorriane Shire M.D.  On: 03/10/2019 12:53   DG Elbow Complete Right  Result Date: 03/27/2019 CLINICAL DATA:  Right elbow pain after fall yesterday. EXAM: RIGHT ELBOW - COMPLETE 3+ VIEW COMPARISON:  Right humerus x-rays dated September 29, 2018. FINDINGS: There is no evidence of fracture, dislocation, or joint effusion. There is no evidence of arthropathy or other focal bone abnormality. Soft tissues are unremarkable. IMPRESSION: Negative. Electronically Signed   By: Titus Dubin M.D.   On: 03/27/2019 09:07   CT Head Wo Contrast  Result Date: 03/27/2019 CLINICAL DATA:  Recent fall with headaches and neck pain, history of COVID-19 positivity EXAM: CT HEAD WITHOUT CONTRAST CT CERVICAL SPINE WITHOUT CONTRAST TECHNIQUE: Multidetector CT imaging of the head and cervical spine was performed following the standard protocol without intravenous contrast. Multiplanar CT image reconstructions of the cervical spine were also generated. COMPARISON:  11/06/2018 FINDINGS: CT HEAD FINDINGS Brain: Chronic atrophic and ischemic changes are noted. No findings to suggest acute hemorrhage, acute infarction or space-occupying mass lesion are seen. Vascular: No hyperdense vessel or unexpected calcification. Skull: Normal. Negative for fracture or focal lesion. Sinuses/Orbits: No acute finding. Other: None. CT CERVICAL SPINE FINDINGS Alignment: Within normal limits. Skull base and vertebrae: 7 cervical segments are well visualized. Multilevel osteophytic changes are noted from C2-T1. Diffuse facet hypertrophic changes are noted resulting in bilateral  neural foraminal narrowing. No acute fracture or acute facet abnormality is noted. Soft tissues and spinal canal: Surrounding soft tissue structures are within normal limits. Upper chest: Visualized lung apices are unremarkable. Other: None IMPRESSION: CT of the head: Chronic atrophic and ischemic changes without acute intracranial abnormality. CT of the cervical spine: Multilevel degenerative change without acute abnormality. Electronically Signed   By: Inez Catalina M.D.   On: 03/27/2019 09:27   CT Cervical Spine Wo Contrast  Result Date: 03/27/2019 CLINICAL DATA:  Recent fall with headaches and neck pain, history of COVID-19 positivity EXAM: CT HEAD WITHOUT CONTRAST CT CERVICAL SPINE WITHOUT CONTRAST TECHNIQUE: Multidetector CT imaging of the head and cervical spine was performed following the standard protocol without intravenous contrast. Multiplanar CT image reconstructions of the cervical spine were also generated. COMPARISON:  11/06/2018 FINDINGS: CT HEAD FINDINGS Brain: Chronic atrophic and ischemic changes are noted. No findings to suggest acute hemorrhage, acute infarction or space-occupying mass lesion are seen. Vascular: No hyperdense vessel or unexpected calcification. Skull: Normal. Negative for fracture or focal lesion. Sinuses/Orbits: No acute finding. Other: None. CT CERVICAL SPINE FINDINGS Alignment: Within normal limits. Skull base and vertebrae: 7 cervical segments are well visualized. Multilevel osteophytic changes are noted from C2-T1. Diffuse facet hypertrophic changes are noted resulting in bilateral neural foraminal narrowing. No acute fracture or acute facet abnormality is noted. Soft tissues and spinal canal: Surrounding soft tissue structures are within normal limits. Upper chest: Visualized lung apices are unremarkable. Other: None IMPRESSION: CT of the head: Chronic atrophic and ischemic changes without acute intracranial abnormality. CT of the cervical spine: Multilevel degenerative  change without acute abnormality. Electronically Signed   By: Inez Catalina M.D.   On: 03/27/2019 09:27   DG Chest Bilateral Decubitus  Result Date: 03/10/2019 CLINICAL DATA:  Left pleural effusion. EXAM: CHEST - BILATERAL DECUBITUS VIEW COMPARISON:  Chest x-ray dated 12/14/2017 and chest CT dated 02/19/2016 FINDINGS: Decubitus views of the chest demonstrate no right pleural effusion. The left pleural effusion is loculated laterally and does not layer in the decubitus position. No other abnormalities. IMPRESSION: Chronic loculated left pleural effusion. No layering in the decubitus position. No  change since the chest CT dated 02/19/2016. Electronically Signed   By: Lorriane Shire M.D.   On: 03/10/2019 12:50   US Abdomen Limited  Result Date: 03/29/2019 CLINICAL DATA:  Abdominal distension.  Evaluate for ascites. EXAM: LIMITED ABDOMEN ULTRASOUND FOR ASCITES TECHNIQUE: Limited ultrasound survey for ascites was performed in all four abdominal quadrants. COMPARISON:  CT 03/15/2017 FINDINGS: Ultrasound evaluation of all 4 quadrants demonstrates no evidence of ascites. Small cyst over the upper pole right kidney unchanged. IMPRESSION: No evidence of ascites. Electronically Signed   By: Marin Olp M.D.   On: 03/29/2019 14:27   DG Chest Port 1 View  Result Date: 03/27/2019 CLINICAL DATA:  Unwitnessed fall EXAM: PORTABLE CHEST 1 VIEW COMPARISON:  03/27/2019, 03/10/2019 FINDINGS: Mild bilateral interstitial thickening. Small left pleural effusion with left basilar atelectasis. No pneumothorax. Stable cardiomediastinal silhouette. No aggressive osseous lesion. IMPRESSION: No acute cardiopulmonary disease. Stable small left pleural effusion with left basilar atelectasis. Electronically Signed   By: Kathreen Devoid   On: 03/27/2019 12:08   DG Chest Port 1 View  Result Date: 03/27/2019 CLINICAL DATA:  Shortness of breath. EXAM: PORTABLE CHEST 1 VIEW COMPARISON:  None. FINDINGS: Stable cardiomegaly. Atherosclerosis of  thoracic aorta is noted. No pneumothorax is noted. Right lung is clear. Moderate size left pleural effusion is noted with probable underlying atelectasis or infiltrate. Old right clavicular fracture is again noted. IMPRESSION: Stable moderate size left pleural effusion is noted with probable underlying atelectasis or infiltrate. Aortic Atherosclerosis (ICD10-I70.0). Electronically Signed   By: Marijo Conception M.D.   On: 03/27/2019 09:08    Subjective: Pt seen and examined this am. Feels better . Asking if he is being discharged today. Breathing better. No cough  Discharge Exam: Vitals:   03/31/19 0000 03/31/19 0800  BP: 129/67 (!) 143/60  Pulse: 67 67  Resp:    Temp:    SpO2: 93% 91%   Vitals:   03/29/19 2246 03/30/19 2105 03/31/19 0000 03/31/19 0800  BP: (!) 150/64 (!) 146/64 129/67 (!) 143/60  Pulse: 83 75 67 67  Resp:      Temp: 98.2 F (36.8 C)     TempSrc: Oral     SpO2: 96%  93% 91%  Weight:      Height:        General: Pt is alert, awake, not in acute distress Cardiovascular: RRR, S1/S2 +, no rubs, no gallops Respiratory: CTA bilaterally, no wheezing, no rhonchi Abdominal: Soft, NT, ND, bowel sounds + Extremities: no edema, no cyanosis   The results of significant diagnostics from this hospitalization (including imaging, microbiology, ancillary and laboratory) are listed below for reference.    Microbiology: Recent Results (from the past 240 hour(s))  Blood Culture (routine x 2)     Status: None (Preliminary result)   Collection Time: 03/27/19  8:36 AM   Specimen: BLOOD  Result Value Ref Range Status   Specimen Description BLOOD BLOOD LEFT FOREARM  Final   Special Requests Blood Culture adequate volume  Final   Culture   Final    NO GROWTH 4 DAYS Performed at [facility], [address] [place], [place] [address]    Report Status PENDING  Incomplete  Blood Culture (routine x 2)     Status: None (Preliminary result)   Collection Time: 03/27/19   8:36 AM   Specimen: BLOOD  Result Value Ref Range Status   Specimen Description BLOOD BLOOD LEFT FOREARM  Final   Special Requests   Final  BOTTLES DRAWN AEROBIC AND ANAEROBIC Blood Culture adequate volume   Culture   Final    NO GROWTH 4 DAYS Performed at [facility], [address] [place],  [address]    Report Status PENDING  Incomplete     Labs: BNP (last 3 results) Recent Labs    03/27/19 0836 03/27/19 1254  BNP 184.0* [id number]*   Basic Metabolic Panel: Recent Labs  Lab 03/27/19 0836 03/27/19 1254 03/28/19 0608 03/29/19 0412 03/30/19 0510 03/31/19 0518  NA 132*  --  139 139 141 139  K 3.2*  --  3.4* 3.3* 4.5 4.0  CL 85*  --  94* 96* 99 100  CO2 31  --  32 31 32 27  GLUCOSE 148*  --  113* 102* 112* 93  BUN 72*  --  62* 52* 49* 48*  CREATININE 2.48*  --  1.84* 1.62* 1.48* 1.29*  CALCIUM 9.5  --  8.9 8.9 9.1 9.0  MG  --  2.4 2.4 2.2 2.1 2.0  PHOS  --  4.1 3.8 2.6 2.0* 3.4   Liver Function Tests: Recent Labs  Lab 03/27/19 0836 03/28/19 0608 03/29/19 0412 03/30/19 0510 03/31/19 0518  AST 37 51* 61* 61* 51*  ALT 20 19 20 23 25   ALKPHOS 79 67 58 62 63  BILITOT 0.9 0.6 0.6 0.8 0.7  PROT 9.1* 7.9 7.7 7.7 7.9  ALBUMIN 3.8 3.1* 3.1* 3.2* 3.2*   No results for input(s): LIPASE, AMYLASE in the last 168 hours. No results for input(s): AMMONIA in the last 168 hours. CBC: Recent Labs  Lab 03/27/19 0836 03/28/19 0608 03/29/19 0412 03/30/19 0510 03/31/19 0518  WBC 10.5 10.2 8.8 9.6 11.1*  NEUTROABS 7.3 6.3 5.8 6.9 8.1*  HGB 12.7* 11.1* 10.7* 11.5* 10.9*  HCT 39.2 34.1* 32.6* 35.4* 33.8*  MCV 90.5 91.2 90.6 91.0 91.1  PLT 155 136* 145* 160 152   Cardiac Enzymes: No results for input(s): CKTOTAL, CKMB, CKMBINDEX, TROPONINI in the last 168 hours. BNP: Invalid input(s): POCBNP CBG: No results for input(s): GLUCAP in the last 168 hours. D-Dimer No results for input(s): DDIMER in the last 72 hours. Hgb A1c No results for input(s):  HGBA1C in the last 72 hours. Lipid Profile No results for input(s): CHOL, HDL, LDLCALC, TRIG, CHOLHDL, LDLDIRECT in the last 72 hours. Thyroid function studies No results for input(s): TSH, T4TOTAL, T3FREE, THYROIDAB in the last 72 hours.  Invalid input(s): FREET3 Anemia work up Recent Labs    03/30/19 0510 03/31/19 0518  FERRITIN 243 192   Urinalysis    Component Value Date/Time   COLORURINE YELLOW (A) 03/27/2019 1030   APPEARANCEUR HAZY (A) 03/27/2019 1030   APPEARANCEUR Hazy 12/15/2013 1803   LABSPEC 1.015 03/27/2019 1030   LABSPEC 1.028 12/15/2013 1803   PHURINE 5.0 03/27/2019 1030   GLUCOSEU NEGATIVE 03/27/2019 1030   GLUCOSEU Negative 12/15/2013 1803   HGBUR NEGATIVE 03/27/2019 1030   BILIRUBINUR NEGATIVE 03/27/2019 1030   BILIRUBINUR Negative 12/15/2013 1803   KETONESUR NEGATIVE 03/27/2019 1030   PROTEINUR NEGATIVE 03/27/2019 1030   NITRITE NEGATIVE 03/27/2019 1030   LEUKOCYTESUR NEGATIVE 03/27/2019 1030   LEUKOCYTESUR Negative 12/15/2013 1803   Sepsis Labs Invalid input(s): PROCALCITONIN,  WBC,  LACTICIDVEN Microbiology Recent Results (from the past 240 hour(s))  Blood Culture (routine x 2)     Status: None (Preliminary result)   Collection Time: 03/27/19  8:36 AM   Specimen: BLOOD  Result Value Ref Range Status   Specimen Description BLOOD BLOOD LEFT  FOREARM  Final   Special Requests Blood Culture adequate volume  Final   Culture   Final    NO GROWTH 4 DAYS Performed at [facility], [address] [place], [place] [address]    Report Status PENDING  Incomplete  Blood Culture (routine x 2)     Status: None (Preliminary result)   Collection Time: 03/27/19  8:36 AM   Specimen: BLOOD  Result Value Ref Range Status   Specimen Description BLOOD BLOOD LEFT FOREARM  Final   Special Requests   Final    BOTTLES DRAWN AEROBIC AND ANAEROBIC Blood Culture adequate volume   Culture   Final    NO GROWTH 4 DAYS Performed at [facility], [address] [place], [place] [address]    Report Status PENDING  Incomplete    Time coordinating discharge: Over 30 minutes  SIGNED:  Nolberto Hanlon, MD  [facility] 03/31/2019, 10:45 AM  If 7PM-7AM, please contact night-coverage [email or web address]  This record has been created using Systems analyst. Errors have been sought and corrected,but may not always be located. Such creation errors do not reflect on the standard of care.

## 2019-03-30 NOTE — Consult Note (Signed)
Consultation Note Date: 03/30/2019   Patient Name: [name]  DOB: 1923-11-19  MRN: [id number]  Age / Sex: 86 y.o., male  PCP: System, [place] Not In Referring Physician: Lorella Nimrod, MD  Reason for Consultation: Establishing goals of care  HPI/Patient Profile: 86 y.o. 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 During admission he has completed treated with remdesivir and steroids for COVID with improvement in respiratory status and inflammatory markers. He had increasing abdominal distention- but u/s was negative for ascites. He was found to have AKI on CKD3- creatinine has returned to his baseline. Palliative medicine consulted for goals of care per patient's sons request.  Clinical Assessment and Goals of Care: This consult was completed via telephone due to the restrictions during the COVID-19 pandemic. Thorough chart review and discussion with necessary members of the care team was completed as part of assessment. All issues were discussed and addressed but no physical exam was performed.  Per report patient is very HOH, not oriented to situation, and frequently calls out from his room. He is eating well, he is ambulating minimally with a rolling walker, and requires assistance with ADL's.  Prior to admission he was living with his spouse in assisted living at [facility].   I called and spoke with his son- Marya Amsler regarding goals of care and illness trajectories. Mr. Dimsdale has several comorbidities that put him at high risk of decline. Additionally, he is very weak and likely would not tolerate very aggressive interventions including dialysis or aggressive treatment for his lymphocytic leukemia.   EOL wishes and advanced care planning  were discussed. Patient and his son are hopeful for patient to go to rehab and regain some strength- but understand that patient is high risk to decline and is not likely to recover function to his previous state. 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   Marya Amsler notes that patient worries he will die a long and painful death. We discussed having outpatient Palliative follow patient at rehab- if patient declines further- then would transition to Hospice. Marya Amsler is in agreement to this plan.   Primary Decision Maker OTHER- son- Jakyi Tressel    SUMMARY OF RECOMMENDATIONS -DNR -D/C to rehab with Palliative to follow- if patient declines would transition to Hospice- would not want or tolerate aggressive medical interventions    Code Status/Advance Care Planning:  DNR  Palliative Prophylaxis:   Delirium Protocol  Prognosis:    Unable to determine  Discharge Planning: [place] for rehab with Palliative care service follow-up  Primary Diagnoses: Present on Admission: . Essential hypertension . Acute kidney injury ([place]) . GERD (gastroesophageal reflux disease) . Pneumonia due to COVID-19 virus   I have reviewed the medical record, interviewed the  patient and family, and examined the patient. The following aspects are pertinent.  Past Medical History:  Diagnosis Date  . (HFpEF) heart failure with preserved ejection fraction ([place])    a. 11/2017 Echo: EF 60-65%, no rwma, GR1 DD. Nl RV fxn.  . Anginal pain ([place])    c/o heaviness a few years ago.  stent placed and all resolved  . Anxiety   . BPH (benign prostatic hypertrophy)   . Carotid stenosis    a. 05/2015 Carotid U/S: mild nonobs atherosclerosis. No need for f/u.  [place] Kitchen Chronic back pain   . Chronic Left Pleural Effusion    a. 11/2017 Mod by CXR.  [place] Kitchen Coronary artery disease    a. 06/2013 Cath/PCI:  LAD 50-70p (FFR 0.82-->PCI w/ 3.5x24 Promus DES), RCA subtotally occluded w/ L->R collats, LCX 30ost.  . Dyslipidemia   . GERD (gastroesophageal reflux disease)   . Hx of [name]   . Hyperlipidemia   . Hypertension   . Inguinal hernia   . Lightheadedness    a. chronic, somewhat positional.  . Lymphoblastic lymphoma ([place] 1998   turned out to be something else in same family but not a problem  . Major depression   . Sleep apnea    will not use cpap   Social History   Socioeconomic History  . Marital status: Married    Spouse name: Not on file  . Number of children: Not on file  . Years of education: Not on file  . Highest education level: Not on file  Occupational History  . Not on file  Tobacco Use  . Smoking status: Former Smoker    Packs/day: 2.00    Years: 7.00    Pack years: 14.00    Types: Cigarettes    Quit date: 09/17/1953    Years since quitting: 65.5  . Smokeless tobacco: Never Used  Substance and Sexual Activity  . Alcohol use: No    Alcohol/week: 0.0 standard drinks  . Drug use: No  . Sexual activity: Not Currently  Other Topics Concern  . Not on file  Social History Narrative  . Not on file   Social Determinants of Health   Financial Resource Strain:   . Difficulty of Paying Living Expenses: Not on file  Food Insecurity:   . Worried About Charity fundraiser in the Last Year: Not on file  . Ran Out of Food in the Last Year: Not on file  Transportation Needs:   . Lack of Transportation (Medical): Not on file  . Lack of Transportation (Non-Medical): Not on file  Physical Activity:   . Days of Exercise per Week: Not on file  . Minutes of Exercise per Session: Not on file  Stress:   . Feeling of Stress : Not on file  Social Connections:   . Frequency of Communication with Friends and Family: Not on file  . Frequency of Social Gatherings with Friends and Family: Not on file  . Attends Religious Services: Not on file  . Active Member of Clubs or  Organizations: Not on file  . Attends Archivist Meetings: Not on file  . Marital Status: Not on file   Family History  Problem Relation Age of Onset  . Heart disease Mother   . Stroke Mother   . Diabetes Father   . Dementia Father   . Stroke Sister   . Stroke Brother   . Stroke Sister   . Stroke Sister   . Stroke Brother   .  Stroke Brother    Scheduled Meds: . acidophilus  1 capsule Oral Daily  . albuterol  2 puff Inhalation Q6H  . vitamin C  500 mg Oral Daily  . cholecalciferol  5,000 Units Oral Daily  . clonazePAM  1 mg Oral BID  . dexamethasone (DECADRON) injection  6 mg Intravenous Q24H  . docusate sodium  100 mg Oral BID  . doxazosin  1 mg Oral Daily  . escitalopram  20 mg Oral Daily  . gabapentin  300 mg Oral QHS  . heparin  5,000 Units Subcutaneous Q8H  . isosorbide mononitrate  30 mg Oral Daily  . magic mouthwash  10 mL Oral TID   And  . lidocaine  5 mL Mouth/Throat TID  . metoprolol tartrate  12.5 mg Oral BID  . multivitamins with iron  1 tablet Oral Daily  . pantoprazole (PROTONIX) IV  40 mg Intravenous Q24H  . pravastatin  40 mg Oral QHS  . senna-docusate  1 tablet Oral QHS  . zinc sulfate  220 mg Oral Daily   Continuous Infusions: . remdesivir 100 mg in NS 100 mL 100 mg (03/30/19 0940)  . sodium phosphate  Dextrose 5% IVPB 30 mmol (03/30/19 1101)   PRN Meds:.alum & mag hydroxide-simeth, antiseptic oral rinse, guaiFENesin, HYDROcodone-acetaminophen, magic mouthwash, ondansetron, polyethylene glycol, traMADol Medications Prior to Admission:  Prior to Admission medications   Medication Sig Start Date End Date Taking? Authorizing Provider  acetaminophen (TYLENOL) 500 MG tablet Take 1,000 mg by mouth 3 (three) times daily.   Yes [provider]  acetaminophen (TYLENOL) 500 MG tablet Take 500 mg by mouth every 6 (six) hours as needed for mild pain or fever.   Yes [provider]  acidophilus (RISAQUAD) CAPS capsule Take 1 capsule by  mouth daily.   Yes [provider]  alum & mag hydroxide-simeth ([place]) 200-200-20 MG/5ML suspension Take 30 mLs by mouth every 6 (six) hours as needed for indigestion or heartburn.   Yes [provider]  antiseptic oral rinse (BIOTENE) LIQD 15 mLs by Mouth Rinse route 2 (two) times daily as needed for dry mouth.   Yes [provider]  Cholecalciferol (VITAMIN D3) 5000 units CAPS Take 5,000 Units by mouth daily.    Yes [provider]  clonazePAM (KLONOPIN) 1 MG tablet Take 1 mg by mouth 2 (two) times daily.    Yes [provider]  diclofenac sodium (VOLTAREN) 1 % GEL Apply 2 g topically 2 (two) times daily as needed (lower back pain). (use when lidocaine patch is off)   Yes [provider]  docusate sodium (COLACE) 100 MG capsule Take 100 mg by mouth 2 (two) times daily.   Yes [provider]  doxazosin (CARDURA) 1 MG tablet Take 1 tablet (1 mg total) by mouth daily. Patient taking differently: Take 1 mg by mouth at bedtime.  05/28/17  Yes Lada, Satira Anis, MD  escitalopram (LEXAPRO) 20 MG tablet Take 20 mg by mouth daily.    Yes [provider]  gabapentin (NEURONTIN) 300 MG capsule Take 300 mg by mouth at bedtime.  11/18/18  Yes [provider]  guaiFENesin (ROBITUSSIN) 100 MG/5ML liquid Take 200 mg by mouth every 6 (six) hours as needed for cough.    Yes [provider]  hydrocortisone 2.5 % cream Apply 1 application topically every other day. (apply to scalp and alternate with ketoconazole) 12/08/18  Yes [provider]  isosorbide mononitrate (IMDUR) 30 MG 24 hr tablet Take  1 tablet (30 mg total) by mouth daily. 02/15/17  Yes Roselee Nova, MD  ketoconazole (NIZORAL) 2 % cream Apply 1 application topically every other day. (apply to scalp and alternate with hydrocortisone) 12/08/18  Yes [provider]  Lidocaine (ASPERCREME LIDOCAINE) 4 % PTCH Apply 1 patch topically 2 (two) times  daily.   Yes [provider]  loperamide (IMODIUM) 2 MG capsule Take 2 mg by mouth as needed for diarrhea or loose stools. (max 8 doses in 24 hours)   Yes [provider]  Lutein 10 MG TABS Take 10 mg by mouth 2 (two) times daily.   Yes [provider]  magic mouthwash SOLN Take 15 mLs by mouth 4 (four) times daily as needed for mouth pain.   Yes [provider]  magnesium hydroxide (MILK OF MAGNESIA) 400 MG/5ML suspension Take 30 mLs by mouth at bedtime as needed for mild constipation or moderate constipation.   Yes [provider]  Melatonin 5 MG TABS Take 5 mg by mouth at bedtime as needed (sleep).   Yes [provider]  metolazone (ZAROXOLYN) 2.5 MG tablet Take 1 tablet (2.5 mg) by mouth once a day on Mondays/ Wednesdays/ & Fridays Patient taking differently: Take 2.5 mg by mouth every Monday, Wednesday, and Friday.  03/03/19  Yes Theora Gianotti, NP  metoprolol tartrate (LOPRESSOR) 25 MG tablet Take 12.5 mg by mouth 2 (two) times daily.    Yes [provider]  Multiple Vitamins-Iron (MULTIVITAMINS WITH IRON) TABS tablet Take 1 tablet by mouth daily.   Yes [provider]  neomycin-bacitracin-polymyxin (NEOSPORIN) ointment Apply 1 application topically as needed for wound care.   Yes [provider]  nitroGLYCERIN (NITROSTAT) 0.4 MG SL tablet Place 0.4 mg under the tongue every 5 (five) minutes as needed for chest pain.   Yes [provider]  ondansetron (ZOFRAN) 4 MG tablet Take 4 mg by mouth every 6 (six) hours as needed for nausea or vomiting.   Yes [provider]  pantoprazole (PROTONIX) 40 MG tablet Take 1 tablet (40 mg total) by mouth daily. 03/16/17  Yes Roselee Nova, MD  polyethylene glycol [facility] / GLYCOLAX) packet Take 17 g by mouth daily as needed.    Yes [provider]  pravastatin (PRAVACHOL) 40 MG tablet Take 40 mg by mouth at bedtime.   Yes [provider]  senna (SENOKOT) 8.6 MG TABS tablet Take 1 tablet by mouth at bedtime.    Yes [provider]  torsemide (DEMADEX) 20 MG tablet Take 1 tablet (20 mg total) by mouth daily. Take 1 tablet (20 mg) by mouth once a day on Mondays/ Wednesdays/ & Fridays Patient taking differently: Take 20 mg by mouth daily.  03/09/19  Yes Theora Gianotti, NP  traMADol (ULTRAM) 50 MG tablet Take 1 tablet (50 mg total) by mouth every 8 (eight) hours as needed. Patient to carry to his room Patient taking differently: Take 50 mg by mouth 3 (three) times daily.  09/29/18  Yes Triplett, Cari B, FNP  triamcinolone cream (KENALOG) 0.1 % Apply 1 application topically 2 (two) times daily.   Yes [provider]    Vital Signs: BP (!) 150/64 (BP Location: Left Arm)   Pulse 83   Temp 98.2 F (36.8 C) (Oral)   Resp 20   Ht 5\' 10"  (1.778 m)   Wt 94.8 kg   SpO2 96%   BMI 29.99 kg/m  Pain Scale: 0-10  Pain Score: 0-No pain   SpO2: SpO2: 96 % O2 Device:SpO2: 96 % O2 Flow Rate: .O2 Flow Rate (L/min): 0 L/min  IO: Intake/output summary:   Intake/Output Summary (Last 24 hours) at 03/30/2019 1445 Last data filed at 03/29/2019 1935 Gross per 24 hour  Intake 100 ml  Output 500 ml  Net -400 ml    LBM: Last BM Date: 03/30/19 Baseline Weight: Weight: 94.8 kg Most recent weight: Weight: 94.8 kg     Palliative Assessment/Data: PPS: 50%     Thank you for this consult. Palliative medicine will continue to follow and assist as needed.   Time In: 1400 Time Out: 1525 Time Total: 85 minutes Greater than 50%  of this time was spent counseling and coordinating care related to the above assessment and plan.  Signed by: Mariana Kaufman, AGNP-C Palliative Medicine    Please contact Palliative Medicine Team phone at [phone] for questions and concerns.  For individual provider: See Shea Evans

## 2019-03-30 NOTE — Progress Notes (Signed)
PROGRESS NOTE    [name]  [id number] DOB: Jun 06, 1923 DOA: 03/27/2019 PCP: System, Pcp Not In   Brief Narrative:  [name] is a 86 y.o. male with medical history significant of HTN, [name], CKD.  Sent to ED from his facility after having a fall and with complaint of generalized weakness and shortness of breath for the past 3 days.  Apparently tested positive for Covid at his facility on 03/23/2019 with no documentation. Labs were positive for Covid, AKI with creatinine of 2.48, chest x-ray with stable chronic left pleural effusion.  Mildly hypoxic requiring 2 L.  Admitted for COVID-19 infection and started on remdesivir and steroid.  Subjective: Pt was feeling better when seen today. No new complaints.  Assessment & Plan:   Principal Problem:   Pneumonia due to COVID-19 virus Active Problems:   Coronary artery disease involving native coronary artery without angina pectoris   Essential hypertension   GERD (gastroesophageal reflux disease)   Acute kidney injury ([place])   Pressure injury of skin  COVID-19 infection.  Elevated inflammatory markers-now improving.  PCT 0.28 on admission, now 0.14.  No need for antibiotics at this time -Continue with remdesivir-day 4 -Continue with steroid. -Continue with vitamin C and zinc supplement. -Supplemental oxygen to keep saturation above 90%. -Supportive care with antitussive and inhalers. -Monitor inflammatory markers. -Palliative care consult at son's request.  Abdominal Distension.Worsening abdominal distension. H/O malignant lymphocytic lymphoma being managed by Oncology. -Will get abdominal US and then discuss with his oncologist. -Palliative consult after talking with son for symptom management, according to him having this worsening abdominal distention for the past 47-month.  He used to follow-up at [facility] oncology with Dr. Laurance Flatten. -No evidence of ascites on ultrasound.  Palatal Pain. Mild erythema. -Will try Magic mouth wash-  seems helping  [name].  Prior echocardiogram with grade 1 diastolic dysfunction.  BNP mildly elevated at 168 with barely positive troponin most likely demand. No chest pain. -Continue with home dose of metoprolol and Imdur. -Holding diuretic due to AKI.  AKI with CKD stage IIIa.  Creatinine elevated at 2.48 on admission with baseline of 1.5-1.7. Continue to improve, it was 1.48 today.  Most likely at baseline. -Keep holding diuretic. -Monitor renal function. -Avoid nephrotoxic.  Right elbow Wound.  Apparently had a fall at his facility a day prior to admission.  2 large lacerations around right elbow. Wound care was consulted-appreciate their recommendations. Heparin was held as there was a continuous oozing from his lacerations. Hemoglobin dropped to 10.7 from 12.7 on admission 2 days prior-started improving. -Continue to monitor hemoglobin. -We will restart heparin.  GERD. -Continue Protonix.  Objective: Vitals:   03/29/19 1245 03/29/19 1525 03/29/19 2000 03/29/19 2246  BP:   (!) 167/77 (!) 150/64  Pulse:   90 83  Resp:      Temp:  98.7 F (37.1 C)  98.2 F (36.8 C)  TempSrc:  Oral  Oral  SpO2: 94%  94% 96%  Weight:      Height:        Intake/Output Summary (Last 24 hours) at 03/30/2019 [id number] Last data filed at 03/29/2019 1935 Gross per 24 hour  Intake 100 ml  Output 600 ml  Net -500 ml   Filed Weights   03/27/19 0833  Weight: 94.8 kg    Examination:  General exam: Chronically ill-appearing elderly man, in no acute distress.Mild palatal erythema. Respiratory system: Clear to auscultation. Respiratory effort normal. Cardiovascular system: S1 & S2 heard, RRR. No  JVD, murmurs, rubs, gallops or clicks. Gastrointestinal system: Soft, distended, bowel sounds positive. Central nervous system: Alert and oriented to self only, no focal neurological deficits. Extremities: No edema, no cyanosis, pulses intact and symmetrical. Skin: No large lacerations around right elbow  with multiple ecchymosis in extremities. Psychiatry: Judgement and insight appear impaired.   DVT prophylaxis: Heparin Code Status: DNR Family Communication: Son was updated on phone.  Also requesting palliative care consult as he might not be a good candidate for aggressive measures. Disposition Plan: Pending improvement and completion of remdesivir.  He will go back to his facility, most likely tomorrow.  Consultants:   None  Procedures:  Antimicrobials:   Data Reviewed: I have personally reviewed following labs and imaging studies  CBC: Recent Labs  Lab 03/27/19 0836 03/28/19 0608 03/29/19 0412 03/30/19 0510  WBC 10.5 10.2 8.8 9.6  NEUTROABS 7.3 6.3 5.8 6.9  HGB 12.7* 11.1* 10.7* 11.5*  HCT 39.2 34.1* 32.6* 35.4*  MCV 90.5 91.2 90.6 91.0  PLT 155 136* 145* 0000000   Basic Metabolic Panel: Recent Labs  Lab 03/27/19 0836 03/27/19 1254 03/28/19 0608 03/29/19 0412 03/30/19 0510  NA 132*  --  139 139 141  K 3.2*  --  3.4* 3.3* 4.5  CL 85*  --  94* 96* 99  CO2 31  --  32 31 32  GLUCOSE 148*  --  113* 102* 112*  BUN 72*  --  62* 52* 49*  CREATININE 2.48*  --  1.84* 1.62* 1.48*  CALCIUM 9.5  --  8.9 8.9 9.1  MG  --  2.4 2.4 2.2 2.1  PHOS  --  4.1 3.8 2.6 2.0*   GFR: Estimated Creatinine Clearance: 36 mL/min (A) (by C-G formula based on SCr of 1.48 mg/dL (H)). Liver Function Tests: Recent Labs  Lab 03/27/19 0836 03/28/19 0608 03/29/19 0412 03/30/19 0510  AST 37 51* 61* 61*  ALT 20 19 20 23   ALKPHOS 79 67 58 62  BILITOT 0.9 0.6 0.6 0.8  PROT 9.1* 7.9 7.7 7.7  ALBUMIN 3.8 3.1* 3.1* 3.2*   No results for input(s): LIPASE, AMYLASE in the last 168 hours. No results for input(s): AMMONIA in the last 168 hours. Coagulation Profile: No results for input(s): INR, PROTIME in the last 168 hours. Cardiac Enzymes: No results for input(s): CKTOTAL, CKMB, CKMBINDEX, TROPONINI in the last 168 hours. BNP (last 3 results) No results for input(s): PROBNP in the last 8760  hours. HbA1C: Recent Labs    03/27/19 1254  HGBA1C 6.4*   CBG: No results for input(s): GLUCAP in the last 168 hours. Lipid Profile: Recent Labs    03/27/19 0836  TRIG 102   Thyroid Function Tests: Recent Labs    03/27/19 1254  TSH 0.169*  FREET4 0.86   Anemia Panel: Recent Labs    03/29/19 0412 03/30/19 0510  FERRITIN 305 243   Sepsis Labs: Recent Labs  Lab 03/27/19 0836 03/27/19 1111 03/28/19 0608 03/29/19 0412  PROCALCITON 0.28  --  0.19 0.14  LATICACIDVEN 2.4* 1.6  --   --     Recent Results (from the past 240 hour(s))  Blood Culture (routine x 2)     Status: None (Preliminary result)   Collection Time: 03/27/19  8:36 AM   Specimen: BLOOD  Result Value Ref Range Status   Specimen Description BLOOD BLOOD LEFT FOREARM  Final   Special Requests Blood Culture adequate volume  Final   Culture   Final    NO GROWTH  2 DAYS Performed at [facility], [address] [place], [place] [address]    Report Status PENDING  Incomplete  Blood Culture (routine x 2)     Status: None (Preliminary result)   Collection Time: 03/27/19  8:36 AM   Specimen: BLOOD  Result Value Ref Range Status   Specimen Description BLOOD BLOOD LEFT FOREARM  Final   Special Requests   Final    BOTTLES DRAWN AEROBIC AND ANAEROBIC Blood Culture adequate volume   Culture   Final    NO GROWTH 2 DAYS Performed at [facility], [address], [place], [place] [address]    Report Status PENDING  Incomplete     Radiology Studies: US Abdomen Limited  Result Date: 03/29/2019 CLINICAL DATA:  Abdominal distension.  Evaluate for ascites. EXAM: LIMITED ABDOMEN ULTRASOUND FOR ASCITES TECHNIQUE: Limited ultrasound survey for ascites was performed in all four abdominal quadrants. COMPARISON:  CT 03/15/2017 FINDINGS: Ultrasound evaluation of all 4 quadrants demonstrates no evidence of ascites. Small cyst over the upper pole right kidney unchanged. IMPRESSION: No evidence of  ascites. Electronically Signed   By: Marin Olp M.D.   On: 03/29/2019 14:27    Scheduled Meds: . acidophilus  1 capsule Oral Daily  . albuterol  2 puff Inhalation Q6H  . vitamin C  500 mg Oral Daily  . cholecalciferol  5,000 Units Oral Daily  . clonazePAM  1 mg Oral BID  . dexamethasone (DECADRON) injection  6 mg Intravenous Q24H  . docusate sodium  100 mg Oral BID  . doxazosin  1 mg Oral Daily  . escitalopram  20 mg Oral Daily  . gabapentin  300 mg Oral QHS  . heparin  5,000 Units Subcutaneous Q8H  . isosorbide mononitrate  30 mg Oral Daily  . magic mouthwash  10 mL Oral TID   And  . lidocaine  5 mL Mouth/Throat TID  . metoprolol tartrate  12.5 mg Oral BID  . multivitamins with iron  1 tablet Oral Daily  . pantoprazole (PROTONIX) IV  40 mg Intravenous Q24H  . pravastatin  40 mg Oral QHS  . senna-docusate  1 tablet Oral QHS  . zinc sulfate  220 mg Oral Daily   Continuous Infusions: . remdesivir 100 mg in NS 100 mL Stopped (03/29/19 0838)     LOS: 3 days   Time spent: 40 minutes.    Lorella Nimrod, MD [facility]  If 7PM-7AM, please contact night-coverage [email or web address]  03/30/2019, 7:28 AM   This record has been created using Dragon voice recognition software. Errors have been sought and corrected,but may not always be located. Such creation errors do not reflect on the standard of care.

## 2019-03-31 LAB — COMPREHENSIVE METABOLIC PANEL
ALT: 25 U/L (ref 0–44)
AST: 51 U/L — ABNORMAL HIGH (ref 15–41)
Albumin: 3.2 g/dL — ABNORMAL LOW (ref 3.5–5.0)
Alkaline Phosphatase: 63 U/L (ref 38–126)
Anion gap: 12 (ref 5–15)
BUN: 48 mg/dL — ABNORMAL HIGH (ref 8–23)
CO2: 27 mmol/L (ref 22–32)
Calcium: 9 mg/dL (ref 8.9–10.3)
Chloride: 100 mmol/L (ref 98–111)
Creatinine, Ser: 1.29 mg/dL — ABNORMAL HIGH (ref 0.61–1.24)
GFR calc Af Amer: 55 mL/min — ABNORMAL LOW (ref >60)
GFR calc non Af Amer: 47 mL/min — ABNORMAL LOW (ref >60)
Glucose, Bld: 93 mg/dL (ref 70–99)
Potassium: 4 mmol/L (ref 3.5–5.1)
Sodium: 139 mmol/L (ref 135–145)
Total Bilirubin: 0.7 mg/dL (ref 0.3–1.2)
Total Protein: 7.9 g/dL (ref 6.5–8.1)

## 2019-03-31 LAB — CBC WITH DIFFERENTIAL/PLATELET
Abs Immature Granulocytes: 0.1 10*3/uL — ABNORMAL HIGH (ref 0.00–0.07)
Basophils Absolute: 0 10*3/uL (ref 0.0–0.1)
Basophils Relative: 0 %
Eosinophils Absolute: 0.1 10*3/uL (ref 0.0–0.5)
Eosinophils Relative: 1 %
HCT: 33.8 % — ABNORMAL LOW (ref 39.0–52.0)
Hemoglobin: 10.9 g/dL — ABNORMAL LOW (ref 13.0–17.0)
Immature Granulocytes: 1 %
Lymphocytes Relative: 18 %
Lymphs Abs: 2 10*3/uL (ref 0.7–4.0)
MCH: 29.4 pg (ref 26.0–34.0)
MCHC: 32.2 g/dL (ref 30.0–36.0)
MCV: 91.1 fL (ref 80.0–100.0)
Monocytes Absolute: 0.9 10*3/uL (ref 0.1–1.0)
Monocytes Relative: 8 %
Neutro Abs: 8.1 10*3/uL — ABNORMAL HIGH (ref 1.7–7.7)
Neutrophils Relative %: 72 %
Platelets: 152 10*3/uL (ref 150–400)
RBC: 3.71 MIL/uL — ABNORMAL LOW (ref 4.22–5.81)
RDW: 14.2 % (ref 11.5–15.5)
WBC: 11.1 10*3/uL — ABNORMAL HIGH (ref 4.0–10.5)
nRBC: 0 % (ref 0.0–0.2)

## 2019-03-31 LAB — PHOSPHORUS: Phosphorus: 3.4 mg/dL (ref 2.5–4.6)

## 2019-03-31 LAB — C-REACTIVE PROTEIN: CRP: 2 mg/dL — ABNORMAL HIGH (ref <1.0)

## 2019-03-31 LAB — FIBRIN DERIVATIVES D-DIMER (ARMC ONLY): Fibrin derivatives D-dimer (ARMC): 1074.65 ng/mL (FEU) — ABNORMAL HIGH (ref 0.00–499.00)

## 2019-03-31 LAB — FERRITIN: Ferritin: 192 ng/mL (ref 24–336)

## 2019-03-31 LAB — MAGNESIUM: Magnesium: 2 mg/dL (ref 1.7–2.4)

## 2019-03-31 MED ORDER — ASCORBIC ACID 500 MG PO TABS: 500.0000 mg | ORAL_TABLET | Freq: Every day | ORAL | Status: AC

## 2019-03-31 MED ORDER — MAGIC MOUTHWASH: 10.0000 mL | Freq: Three times a day (TID) | ORAL | 0 refills | Status: AC | PRN

## 2019-03-31 MED ORDER — ALBUTEROL SULFATE HFA 108 (90 BASE) MCG/ACT IN AERS: 2.0000 | INHALATION_SPRAY | Freq: Four times a day (QID) | RESPIRATORY_TRACT | Status: AC

## 2019-03-31 MED ORDER — LIDOCAINE VISCOUS HCL 2 % MT SOLN: 5.0000 mL | Freq: Three times a day (TID) | OROMUCOSAL | 0 refills | Status: AC

## 2019-03-31 MED ORDER — ZINC SULFATE 220 (50 ZN) MG PO CAPS: 220.0000 mg | ORAL_CAPSULE | Freq: Every day | ORAL | Status: DC

## 2019-03-31 MED ORDER — DEXAMETHASONE 6 MG PO TABS: 6.0000 mg | ORAL_TABLET | Freq: Every day | ORAL | 0 refills | Status: AC

## 2019-03-31 NOTE — Progress Notes (Signed)
New referral for [facility] community Palliative to follow at [facility] received from [facility]. Plan is for discharge today. Please include Palliative to follow in the discharge summary. Patient information sent to referral. Thank you for this referral. Flo Shanks BSN, RN, [place] [phone]

## 2019-03-31 NOTE — Plan of Care (Signed)
  Problem: Education: Goal: Knowledge of risk factors and measures for prevention of condition will improve Outcome: Completed/Met   Problem: Coping: Goal: Psychosocial and spiritual needs will be supported Outcome: Completed/Met   Problem: Respiratory: Goal: Will maintain a patent airway Outcome: Completed/Met Goal: Complications related to the disease process, condition or treatment will be avoided or minimized Outcome: Completed/Met   Discharge orders received. Discharge info in packet. Ivs removed. Attempted to call family x3. No answer. Left message to call back. Pt discharged and transferred to facility.

## 2019-03-31 NOTE — Progress Notes (Signed)
Attempted to call family member 3 times but no answer.

## 2019-03-31 NOTE — Progress Notes (Signed)
Pt family called back and info about pt transfer explained.

## 2019-03-31 NOTE — Progress Notes (Signed)
Attempted to call family x3 but no answer. Left voicemail to call back.

## 2019-03-31 NOTE — Care Management Important Message (Signed)
Important Message  Patient Details  Name: ZEBBIE AUGUSTA MRN: [id number] Date of Birth: December 17, 1923   Medicare Important Message Given:  Yes Reviewed with patient's son Marya Amsler via phone.  Patient is in [place] isolation.     Shelbie Hutching, RN 03/31/2019, 1:45 PM

## 2019-03-31 NOTE — TOC Transition Note (Signed)
Transition of Care [facility]) - CM/SW Discharge Note   Patient Details  Name: [name] MRN: [id number] Date of Birth: 1923/10/05  Transition of Care [facility]) CM/SW Contact:  Shelbie Hutching, RN Phone Number: 03/31/2019, 11:57 AM   Clinical Narrative:    Patient has been medically cleared for discharge to [facility] [facility].  Patient will transport via [place], bedside RN to call report to facility at [phone] and then set up EMS transport.  This RNCM made a referral to [facility] for outpatient palliative, Flo Shanks accepted referral.   Patient's son Marya Amsler updated on plan of care for discharge.    Final next level of care: Skilled Nursing Facility Barriers to Discharge: Barriers Resolved   Patient Goals and CMS Choice Patient states their goals for this hospitalization and ongoing recovery are:: per son for patient to get strong enough to return to ALF CMS Medicare.gov Compare Post Acute Care list provided to:: Patient Represenative (must comment) Choice offered to / list presented to : Adult Children(Son Marya Amsler)  Discharge Placement              Patient chooses bed at: [facility] Patient to be transferred to facility by: [facility] Name of family member notified: Bonnee Quin- son Patient and family notified of of transfer: 03/31/19  Discharge Plan and Services   Discharge Planning Services: CM Consult Post Acute Care Choice: [place]                               Social Determinants of Health (SDOH) Interventions     Readmission Risk Interventions Readmission Risk Prevention Plan 03/28/2019  Transportation Screening Complete  PCP or Specialist Appt within 3-5 Days Complete  HRI or [place] (No Data)  Social Work Consult for [place] Planning/Counseling (No Data)  Palliative Care Screening Not Applicable  Medication Review Press photographer) Complete  Some recent data might be hidden

## 2019-03-31 NOTE — Progress Notes (Signed)
Report given to [facility], Therapist, sports at [facility].

## 2019-04-01 LAB — CULTURE, BLOOD (ROUTINE X 2)
Culture: NO GROWTH
Culture: NO GROWTH
Special Requests: ADEQUATE
Special Requests: ADEQUATE

## 2019-04-07 ENCOUNTER — Ambulatory Visit: Payer: Medicare Other | Admitting: Cardiovascular Disease

## 2019-04-12 ENCOUNTER — Telehealth: Payer: Self-pay | Admitting: Cardiovascular Disease

## 2019-04-12 DIAGNOSIS — I5033 Acute on chronic diastolic (congestive) heart failure: Secondary | ICD-10-CM

## 2019-04-12 NOTE — Telephone Encounter (Signed)
Patient wife returning call  °

## 2019-04-12 NOTE — Telephone Encounter (Signed)
Patients son (and POA) calling in wanting to discuss patients fluid pill. Patient was taken off medication while in the hospital however he is now back at [facility] and they are wanting him to start it back. Patient has not taken a diuretic since 2/8.  Patients son is calling to get some advice on what would be best for the patient.

## 2019-04-12 NOTE — Telephone Encounter (Signed)
Spoke with the patient POA ans son [name]. [name] is aware of Brandon Bayley, NP response and recommendation.  [name] that [place] has had another outbreak of COVID and are on a 10 day lock down starting 04/11/19. Advised him that we could schedule a virtual appt and try to coordinate the needs labs with [facility]. [name] sts that the patient and his wife trust Gerald Stabs and will follow his directions.  [name] rqst that I call [name] to make her aware of Chris's response and recommendation and to coordinate the lab and f/u appt needed.  Called the [name]. lmtcb.

## 2019-04-12 NOTE — Telephone Encounter (Signed)
Given prior h/o volume excess, I would not be surprised if he requires resumption of torsemide following recent COVID infection necessitating that he be off of it for awhile.  He was previously on torsemide 20mg  daily. He should f/u with [place] w/in a week of resumption for eval and labwork.  This would ideally be performed in the office, but depending on his ability to travel, could be performed via virtual visit, so long as labs can be carried out in the facility.

## 2019-04-12 NOTE — Telephone Encounter (Signed)
Ignacia Bayley, NP has seen the patient most recently. Message routed to [facility] to give his recommendation regarding the patient resuming his diuretic.

## 2019-04-13 NOTE — Telephone Encounter (Signed)
Attempted to return the call to the pt's wife. She answered and then the call d/c. Attempted again and the call went to voicemail, lmtcb.

## 2019-04-13 NOTE — Telephone Encounter (Addendum)
[place] and left another message for the nurse to call back.    Spoke with the patients wife. Advised her that the patient is scheduled for a Telephone virtual appt with Ignacia Bayley, NP on 04/20/19 @ 9:15am. Advised her that per Chris's instructions the patient should resume Torsemide 20 mg qd and that he will need labwork (bmet) drawn next week. Pt wife verbalized understanding.  Mrs. Lyga sts that that some one was by today to draw labs on both her and the pt. Adv her that I have left 2 message for the nurse to call back so that we can give the order for lab work (bmet) to be drawn next week and to possibly get copies of the labs drawn today.

## 2019-04-13 NOTE — Telephone Encounter (Signed)
Patient calling to speak with nurse about potential issues with the new pill.  She states patient has had minimal output and doesn't know what the medication is doing to him.

## 2019-04-13 NOTE — Telephone Encounter (Signed)
Updated iva, RN on the patients status and she will continue to follow up.

## 2019-04-13 NOTE — Telephone Encounter (Signed)
Returned the call to the patients wife. Pt wife sts that she gave the patient 20 mg of Torsemide at 1:30pm and that the patient has only voided once. Advised her that the medication can take a couple of hours to metabolism and he should have an increase in his urine out put this evening. Pt wife verbalized understanding.  Advised her that I have made several attempts to contact [facility] and I have left 2 messages for the staff to call me back, which they have not as yet. Advised her that I will attempt to call again.  Attempted to contact [employer] again. Spoke with the operator who attempted to get a staff member on the line. She was unable to locate anyone. She apologized and sts that she will also fwd the message to management to ensure a call back to our office is made.

## 2019-04-13 NOTE — Telephone Encounter (Signed)
Patient returning call to discuss appt and to notify blood work is done

## 2019-04-13 NOTE — Telephone Encounter (Addendum)
Spoke with the patients wife. Advise her of Ignacia Bayley, NP recommendation. Ok to resume the patients previous dose of Torsemide 20 mg qd. Patient will need a bmet and virtual appt in 1 week. [employer] is on a 10 day [place] for 10 days starting 04/11/19) Mrs. Duren is agreeable. Adv her that I will work on coordinating the lab work and and scheduling a 1 wk Materials engineer with [employer]. Adv her that I will call back with the details.  [employer] [phone]. lmtcb for the nurse taking care of the patient.

## 2019-04-14 NOTE — Telephone Encounter (Signed)
Spoke to [employer], Therapist, sports with facility. She reported they are not currently doing in house labs and Mr. [place] section is no longer on lock down. He would be able to go to medical mall next week for repeat labs. Confirmed virtual appt for next week.   Order placed for BMET in the medical mall next week.   Routing to primary RN to make her aware.

## 2019-04-18 ENCOUNTER — Telehealth: Payer: Self-pay

## 2019-04-18 NOTE — Telephone Encounter (Signed)
Call to Ms. Acord. She reports "they took all the medications out of our room last night and myself and Mr Locher didn't get all of our medications until 11:30P".   She reports that the medication she needs in the room for Mr Lampman is Torsemide. I told patient that I will call facility and try to figure out what is going on.   Made call to the [facility], spoke to lead med tech, Gaylord.  She reports that a room sweep was performed on Mr and Ms Strums room yesterday. Per her report Ms. Krager "has been purchasing medications off [employer] and she is not measuring and administering medications correctly".  I asked Laqueta Linden if it was possible that Mr Rapuano did not receive his medication on time and she felt that was not accurate.   Tech feels like it is not safe for patient and Mr. Higinbotham to have medications in their room at this time.   Routing to provider to further advise.

## 2019-04-19 ENCOUNTER — Emergency Department
Admission: EM | Admit: 2019-04-19 | Discharge: 2019-04-19 | Disposition: A | Discharge status: Skilled Nursing Facility | Payer: Medicare Other | Attending: Student | Admitting: Student

## 2019-04-19 ENCOUNTER — Telehealth: Payer: Self-pay | Admitting: Nurse Practitioner

## 2019-04-19 ENCOUNTER — Other Ambulatory Visit: Payer: Self-pay

## 2019-04-19 DIAGNOSIS — Z79899 Other long term (current) drug therapy: Secondary | ICD-10-CM | POA: Diagnosis not present

## 2019-04-19 DIAGNOSIS — Z0000 Encounter for general adult medical examination without abnormal findings: Secondary | ICD-10-CM

## 2019-04-19 DIAGNOSIS — G510 Bell's palsy: Secondary | ICD-10-CM | POA: Diagnosis not present

## 2019-04-19 DIAGNOSIS — R1310 Dysphagia, unspecified: Secondary | ICD-10-CM | POA: Diagnosis not present

## 2019-04-19 DIAGNOSIS — R05 Cough: Secondary | ICD-10-CM | POA: Insufficient documentation

## 2019-04-19 DIAGNOSIS — I110 Hypertensive heart disease with heart failure: Secondary | ICD-10-CM | POA: Insufficient documentation

## 2019-04-19 DIAGNOSIS — I509 Heart failure, unspecified: Secondary | ICD-10-CM | POA: Diagnosis not present

## 2019-04-19 NOTE — Telephone Encounter (Signed)
Patients wife is calling in stating patient is in the hospital. Patients wife states they are going to do x-rays on chest and not sure what else

## 2019-04-19 NOTE — ED Notes (Signed)
Skin tears on right arm wrapped and dressed by this RN.

## 2019-04-19 NOTE — ED Notes (Signed)
Attempted to contact pt's wife Lucielle with no answer. Will retry to contact.

## 2019-04-19 NOTE — ED Triage Notes (Signed)
Pt arrived via [facility] from [facility] due to coughing while taking his pills and staff wanted him evaluated for swallow study but pt has hx of esophagus probs per pt and pt's wife. pt and his wife did not want him to come. pt denies pain.

## 2019-04-19 NOTE — ED Provider Notes (Signed)
[facility] Emergency Department Provider Note  ____________________________________________   First MD Initiated Contact with Patient 04/19/19 1159     (approximate)  I have reviewed the triage vital signs and the nursing notes.  History  Chief Complaint Croup    HPI [name] is a 86 y.o. male PMHx as below, who presents to the ED via EMS from [facility]. Per EMS, [facility] sent patient for a swallow study because he apparently had an episode of coughing while taking some of his pills.   The patient has no acute complaints on arrival and apparently did not want to be transferred here. He does state he had some trouble swallowing his pills this AM but states it was because there were several large pills. He says he has an issue with this every once in a while but it does not bother him. This has been ongoing for quite some time without any acute changes. He denies any chest pain, SOB, or trouble breathing. He denies any visual changes, speech changes, weakness, numbness, or tingling.   Per daughter via phone, patient has had the same issues ongoing with his swallowing for years. This is not a new problem for him.   Per [employer] representative, the executive director sent him here requesting a swallow study. They have noticed some difficulty swallowing his pills and food over the last several days.    Per son via phone (patient's medical decision maker) patient has had trouble swallowing ever since he was diagnosed with [name] in his 19s. There have been no acute changes to this. This has been an ongoing issue for more than 50 years, and has not been any different than prior. Son states there has been a lot of turnover at [employer], so likely the new staff was worried about the symptoms if they did not know him previously. Son does not desire any further testing or intervention.    Past Medical Hx Past Medical History:  Diagnosis  Date  . (HFpEF) heart failure with preserved ejection fraction ([place])    a. 11/2017 Echo: EF 60-65%, no rwma, GR1 DD. Nl RV fxn.  . Anginal pain ([place])    c/o heaviness a few years ago.  stent placed and all resolved  . Anxiety   . BPH (benign prostatic hypertrophy)   . Carotid stenosis    a. 05/2015 Carotid U/S: mild nonobs atherosclerosis. No need for f/u.  Marland Kitchen Chronic back pain   . Chronic Left Pleural Effusion    a. 11/2017 Mod by CXR.  Marland Kitchen Coronary artery disease    a. 06/2013 Cath/PCI: LAD 50-70p (FFR 0.82-->PCI w/ 3.5x24 Promus DES), RCA subtotally occluded w/ L->R collats, LCX 30ost.  . Dyslipidemia   . GERD (gastroesophageal reflux disease)   . Hx of [name]   . Hyperlipidemia   . Hypertension   . Inguinal hernia   . Lightheadedness    a. chronic, somewhat positional.  . 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 Major depression   . Sleep apnea    will not use cpap    Problem List Patient Active Problem List   Diagnosis Date Noted  . Advanced care planning/counseling discussion   . Goals of care, counseling/discussion   . Pressure injury of skin 03/28/2019  . Acute on chronic diastolic heart failure ([place]) 03/27/2019  . Pneumonia due to COVID-19 virus 03/27/2019  .  COVID-19 03/27/2019  . Malnutrition of moderate degree 06/15/2017  . Pneumonia 06/13/2017  . 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 S/P kyphoplasty 01/14/2017  . Respiratory failure, post-operative ([place]) 12/31/2016  . Acute respiratory failure ([place]) 12/30/2016  . Altered mental status 12/07/2016  . Acute kidney injury ([place]) 09/14/2016  . Head injury, intracranial, without loss of consciousness or fracture ([place]) 09/07/2016  . Groin pain, left 06/15/2016  . Closed right hip fracture, with routine healing, subsequent encounter 04/15/2016  . Productive cough 03/23/2016  . Medicare annual wellness  visit, subsequent 02/19/2016  . Major depression 10/14/2015  . Behavior disturbance 09/12/2015  . Mobility impaired 08/27/2015  . Skin ulcer of back ([place]) 07/01/2015  . Laceration of right hand 07/01/2015  . Traumatic hematoma of lower back 07/01/2015  . Encounters for administrative purpose 04/01/2015  . Skin lesion of face 12/24/2014  . Contusion of right hand 11/28/2014  . Herpes zoster 10/30/2014  . Need for immunization against influenza 10/24/2014  . Compression fracture of L4 lumbar vertebra 09/28/2014  . Rectal or anal pain 08/13/2014  . Anxiety disorder 07/23/2014  . BPH without obstruction/lower urinary tract symptoms 07/23/2014  . Atherosclerosis of coronary artery 07/23/2014  . Chronic LBP 07/23/2014  . Hypercholesteremia 07/23/2014  . Benign hypertension 07/23/2014  . Acid reflux 07/23/2014  . Chronic recurrent major depressive disorder ([place]) 07/23/2014  . GERD (gastroesophageal reflux disease) 07/23/2014  . Coronary artery disease involving native coronary artery without angina pectoris 07/06/2014  . Essential hypertension 07/06/2014  . Carotid stenosis 07/06/2014  . Hyperlipidemia   . H/O [name] 12/20/2011    Past Surgical Hx Past Surgical History:  Procedure Laterality Date  . ANTERIOR APPROACH HEMI HIP ARTHROPLASTY Right 04/16/2016   Procedure: ANTERIOR APPROACH HEMI HIP ARTHROPLASTY;  Surgeon: Hessie Knows, MD;  Location: [facility] ORS;  Service: Orthopedics;  Laterality: Right;  . APPENDECTOMY     86 years old  . arm fracture Right 2008   rod in arm  . CARDIAC CATHETERIZATION  2002  . CARDIAC CATHETERIZATION  06/2013   stent placed  . COLONOSCOPY    . EYE SURGERY Bilateral 2008   cataract extraction  . FEMUR FRACTURE SURGERY Left    with rod  . HEMORROIDECTOMY     77 years ago  . HIP FRACTURE SURGERY    . JOINT REPLACEMENT Bilateral 03/2016   right partial knee replacement, bilateral THR  . KYPHOPLASTY N/A 12/30/2016   Procedure: [id number];   Surgeon: Hessie Knows, MD;  Location: [facility] ORS;  Service: Orthopedics;  Laterality: N/A;    Medications Prior to Admission medications   Medication Sig Start Date End Date Taking? Authorizing Provider  acetaminophen (TYLENOL) 500 MG tablet Take 1,000 mg by mouth 3 (three) times daily.    [provider]  acetaminophen (TYLENOL) 500 MG tablet Take 500 mg by mouth every 6 (six) hours as needed for mild pain or fever.    [provider]  acidophilus (RISAQUAD) CAPS capsule Take 1 capsule by mouth daily.    [provider]  albuterol (VENTOLIN HFA) 108 (90 Base) MCG/ACT inhaler Inhale 2 puffs into the lungs every 6 (six) hours. 03/31/19   Nolberto Hanlon, MD  alum & mag hydroxide-simeth (MINTOX) 200-200-20 MG/5ML suspension Take 30 mLs by mouth every 6 (six) hours as needed for indigestion or heartburn.    [provider]  antiseptic oral rinse (BIOTENE) LIQD 15 mLs by Mouth Rinse route 2 (two) times daily as  needed for dry mouth.    [provider]  ascorbic acid (VITAMIN C) 500 MG tablet Take 1 tablet (500 mg total) by mouth daily. 03/31/19   Nolberto Hanlon, MD  Cholecalciferol (VITAMIN D3) 5000 units CAPS Take 5,000 Units by mouth daily.     [provider]  clonazePAM (KLONOPIN) 1 MG tablet Take 1 mg by mouth 2 (two) times daily.     [provider]  diclofenac sodium (VOLTAREN) 1 % GEL Apply 2 g topically 2 (two) times daily as needed (lower back pain). (use when lidocaine patch is off)    [provider]  docusate sodium (COLACE) 100 MG capsule Take 100 mg by mouth 2 (two) times daily.    [provider]  doxazosin (CARDURA) 1 MG tablet Take 1 tablet (1 mg total) by mouth daily. Patient taking differently: Take 1 mg by mouth at bedtime.  05/28/17   Lada, Satira Anis, MD  escitalopram (LEXAPRO) 20 MG tablet Take 20 mg by mouth daily.     [provider]  gabapentin (NEURONTIN) 300 MG capsule Take 300 mg by mouth at  bedtime.  11/18/18   [provider]  guaiFENesin (ROBITUSSIN) 100 MG/5ML liquid Take 200 mg by mouth every 6 (six) hours as needed for cough.     [provider]  hydrocortisone 2.5 % cream Apply 1 application topically every other day. (apply to scalp and alternate with ketoconazole) 12/08/18   [provider]  isosorbide mononitrate (IMDUR) 30 MG 24 hr tablet Take 1 tablet (30 mg total) by mouth daily. 02/15/17   Roselee Nova, MD  ketoconazole (NIZORAL) 2 % cream Apply 1 application topically every other day. (apply to scalp and alternate with hydrocortisone) 12/08/18   [provider]  Lidocaine (ASPERCREME LIDOCAINE) 4 % PTCH Apply 1 patch topically 2 (two) times daily.    [provider]  lidocaine (XYLOCAINE) 2 % solution Use as directed 5 mLs in the mouth or throat 3 (three) times daily. 03/31/19   Nolberto Hanlon, MD  loperamide (IMODIUM) 2 MG capsule Take 2 mg by mouth as needed for diarrhea or loose stools. (max 8 doses in 24 hours)    [provider]  Lutein 10 MG TABS Take 10 mg by mouth 2 (two) times daily.    [provider]  magic mouthwash SOLN Take 10 mLs by mouth 3 (three) times daily as needed for mouth pain. 03/31/19   Nolberto Hanlon, MD  Melatonin 5 MG TABS Take 5 mg by mouth at bedtime as needed (sleep).    [provider]  metoprolol tartrate (LOPRESSOR) 25 MG tablet Take 12.5 mg by mouth 2 (two) times daily.     [provider]  Multiple Vitamins-Iron (MULTIVITAMINS WITH IRON) TABS tablet Take 1 tablet by mouth daily.    [provider]  neomycin-bacitracin-polymyxin (NEOSPORIN) ointment Apply 1 application topically as needed for wound care.    [provider]  nitroGLYCERIN (NITROSTAT) 0.4 MG SL tablet Place 0.4 mg under the tongue every 5 (five) minutes as needed for chest pain.    [provider]  ondansetron (ZOFRAN) 4 MG tablet Take 4 mg by mouth every 6 (six) hours  as needed for nausea or vomiting.    [provider]  pantoprazole (PROTONIX) 40 MG tablet Take 1 tablet (40 mg total) by mouth daily. 03/16/17   Roselee Nova, MD  polyethylene glycol [facility] / Floria Raveling) packet Take 17 g by mouth  daily as needed.     [provider]  pravastatin (PRAVACHOL) 40 MG tablet Take 40 mg by mouth at bedtime.    [provider]  senna (SENOKOT) 8.6 MG TABS tablet Take 1 tablet by mouth at bedtime.     [provider]  torsemide (DEMADEX) 20 MG tablet Take 1 tablet (20 mg total) by mouth daily. Take 1 tablet (20 mg) by mouth once a day on Mondays/ Wednesdays/ & Fridays Patient taking differently: Take 20 mg by mouth daily.  03/09/19   Theora Gianotti, NP  traMADol (ULTRAM) 50 MG tablet Take 1 tablet (50 mg total) by mouth every 8 (eight) hours as needed. Patient to carry to his room Patient taking differently: Take 50 mg by mouth 3 (three) times daily.  09/29/18   Triplett, Johnette Abraham B, FNP  triamcinolone cream (KENALOG) 0.1 % Apply 1 application topically 2 (two) times daily.    [provider]  zinc sulfate 220 (50 Zn) MG capsule Take 1 capsule (220 mg total) by mouth daily. 03/31/19   Nolberto Hanlon, MD    Allergies Cyclobenzaprine, Other, Sulfamethoxazole, Baclofen, Nsaids, Sulfamethoxazole-trimethoprim, and Tolmetin  Family Hx Family History  Problem Relation Age of Onset  . Heart disease Mother   . Stroke Mother   . Diabetes Father   . Dementia Father   . Stroke Sister   . Stroke Brother   . Stroke Sister   . Stroke Sister   . Stroke Brother   . Stroke Brother     Social Hx Social History   Tobacco Use  . Smoking status: Former Smoker    Packs/day: 2.00    Years: 7.00    Pack years: 14.00    Types: Cigarettes    Quit date: 09/17/1953    Years since quitting: 65.6  . Smokeless tobacco: Never Used  Substance Use Topics  . Alcohol use: No    Alcohol/week: 0.0 standard drinks  . Drug use: No      Review of Systems  Constitutional: Negative for fever, chills. Eyes: Negative for visual changes. [name]: Negative for sore throat. Cardiovascular: Negative for chest pain. Respiratory: Negative for shortness of breath. Gastrointestinal: Negative for nausea, vomiting. Positive for intermittent difficulty swallowing.  Genitourinary: Negative for dysuria. Musculoskeletal: Negative for leg swelling. Skin: Negative for rash. Positive for skin tear to R lateral elbow and forearm.  Neurological: Negative for headaches.   Physical Exam  Vital Signs: ED Triage Vitals [04/19/19 1154]  Enc Vitals Group     BP 102/61     Pulse Rate 97     Resp 16     Temp 98.1 F (36.7 C)     Temp Source Oral     SpO2 96 %     Weight 201 lb (91.2 kg)     Height 5\' 10"  (1.778 m)     Head Circumference      Peak Flow      Pain Score 0     Pain Loc      Pain Edu?      Excl. in [place]?     Constitutional: Alert and oriented x 4. Well developed, well nourished.  Head: Normocephalic. Atraumatic. 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  Eyes: Conjunctivae clear, sclera anicteric. R lid droop 2/2 hx of facial nerve palsy.  Nose: No masses or lesions. No congestion. No rhinorrhea. Mouth/Throat: Wearing mask.  Neck: No stridor. No masses or appreciable thyromegaly.  Cardiovascular: Normal rate, regular rhythm.  Extremities well perfused. Respiratory: Normal respiratory effort.  Lungs CTAB. Gastrointestinal: Soft. Non-distended. Non-tender.  Genitourinary: Deferred. Musculoskeletal: No lower extremity edema. No deformities. Neurologic:  Normal speech and language. No gross focal or lateralizing neurologic deficits are appreciated. Alert and oriented.  UE and LE strength 5/5 and symmetric. UE and LE SILT.  Skin: Old skin tears to R lateral elbow and forearm 2/2 fall several weeks ago. Dressed in [place].  Psychiatric: Mood and affect are appropriate for situation.   Procedures  Procedure(s)  performed (including critical care):  Procedures   Initial Impression / Assessment and Plan / MDM / ED Course  86 y.o. male who presents to the ED from [facility], who were concerned about his swallowing and requesting a swallow study. Hx as above.   Exam c/w chronic [name], otherwise no acute neurological findings.   Patient himself does want not want any further testing done, he says this is a chronic ongoing problem with no acute changes. Discussed case with son via phone (as above) who confirms this. He does not desire further testing either.   This has been ongoing due to hx of [name]. No acute changes. No new neurological symptoms or signs on exam to suggest acute neurological etiology.  As such, do not feel any further testing is indicated at this time.  Patient stable for discharge back to his facility.  Given instructions to administer pills individually, with thickened food product to aid in swallowing.  Patient and son are comfortable with plan.  As part of my medical decision making I have reviewed available labs, radiological tests, reviewed old records, obtained additional history from family (if available).  Final Clinical Impression(s) / ED Diagnosis  Final diagnoses:  Evaluation by medical service required       Note:  This document was prepared using Dragon voice recognition software and may include unintentional dictation errors.   Lilia Pro., MD 04/19/19 1330

## 2019-04-19 NOTE — Telephone Encounter (Signed)
Received from Ignacia Bayley, NP, "It sounds like a family conference with facility admin and medical staff is warranted.  I'm not sure how much more we have to add, other than that the St[facility] have always been very honest and forthcoming to our team, and I definitely noted a difference in Ms. Ledesma's volume mgmt, once she was allowed to keep her diuretic in her room."  I returned call to [employer] to speak with director about possibly having a team meeting regarding medications for Mr and Mrs Fidel.  Spoke to Mudlogger, Overton Mam. She agreed to team meeting and I gave her my contact information to arrange this.   I made call to Ms. Schmale for update and she reported that she no longer wants to pursue keeping medications in the room for herself and Mr. Lienhard after speaking with office staff.   I advised her to call back if she has anything further needed.

## 2019-04-19 NOTE — ED Notes (Signed)
Called [facility] for transport to [facility]

## 2019-04-19 NOTE — Discharge Instructions (Addendum)
Mr. Trembath has had ongoing issues with his swallowing since he was in his 81's due to having [name] (facial droop).   At this time both himself and his medical decision maker, his son, do not desire further testing.   To help with his pills, you can administer them with a thickened product like applesauce or pudding. He should take his pills one at at time (should not take multiple pills all at once). If he has larger pills that are okay to be split, this can be done as well.

## 2019-04-20 ENCOUNTER — Encounter: Payer: Self-pay | Admitting: Nurse Practitioner

## 2019-04-20 ENCOUNTER — Telehealth (INDEPENDENT_AMBULATORY_CARE_PROVIDER_SITE_OTHER): Payer: Medicare Other | Admitting: Nurse Practitioner

## 2019-04-20 ENCOUNTER — Other Ambulatory Visit: Payer: Self-pay

## 2019-04-20 VITALS — BP 140/74 | HR 77 | Ht 70.0 in | Wt 201.0 lb

## 2019-04-20 DIAGNOSIS — I2510 Atherosclerotic heart disease of native coronary artery without angina pectoris: Secondary | ICD-10-CM | POA: Diagnosis not present

## 2019-04-20 DIAGNOSIS — N183 Chronic kidney disease, stage 3 unspecified: Secondary | ICD-10-CM | POA: Diagnosis not present

## 2019-04-20 DIAGNOSIS — E785 Hyperlipidemia, unspecified: Secondary | ICD-10-CM

## 2019-04-20 DIAGNOSIS — I10 Essential (primary) hypertension: Secondary | ICD-10-CM

## 2019-04-20 DIAGNOSIS — I5032 Chronic diastolic (congestive) heart failure: Secondary | ICD-10-CM

## 2019-04-20 DIAGNOSIS — I130 Hypertensive heart and chronic kidney disease with heart failure and stage 1 through stage 4 chronic kidney disease, or unspecified chronic kidney disease: Secondary | ICD-10-CM | POA: Diagnosis not present

## 2019-04-20 NOTE — Patient Instructions (Signed)
Medication Instructions:   1. Your physician recommends that you continue on your current medications as directed. Please refer to the Current Medication list given to you today.  *If you need a refill on your cardiac medications before your next appointment, please call your pharmacy*   Lab Work:  1. None Ordered  If you have labs (blood work) drawn today and your tests are completely normal, you will receive your results only by: Marland Kitchen MyChart Message (if you have MyChart) OR . A paper copy in the mail If you have any lab test that is abnormal or we need to change your treatment, we will call you to review the results.   Testing/Procedures:  1. None Ordered   Follow-Up: At [facility], you and your health needs are our priority.  As part of our continuing mission to provide you with exceptional heart care, we have created designated Provider Care Teams.  These Care Teams include your primary Cardiologist (physician) and Advanced Practice Providers (APPs -  Physician Assistants and Nurse Practitioners) who all work together to provide you with the care you need, when you need it.  We recommend signing up for the patient portal called "MyChart".  Sign up information is provided on this After Visit Summary.  MyChart is used to connect with patients for Virtual Visits (Telemedicine).  Patients are able to view lab/test results, encounter notes, upcoming appointments, etc.  Non-urgent messages can be sent to your provider as well.   To learn more about what you can do with MyChart, go to [email or web address].    Your next appointment:  April 15th, 2021 @ 3:00pm. You will be seeing Dr. Fletcher Anon

## 2019-04-20 NOTE — Progress Notes (Signed)
Virtual Visit via Telephone Note   This visit type was conducted due to national recommendations for restrictions regarding the COVID-19 Pandemic (e.g. social distancing) in an effort to limit this patient's exposure and mitigate transmission in our community.  Due to his co-morbid illnesses, this patient is at least at moderate risk for complications without adequate follow up.  This format is felt to be most appropriate for this patient at this time.  The patient did not have access to video technology/had technical difficulties with video requiring transitioning to audio format only (telephone).  All issues noted in this document were discussed and addressed.  No physical exam could be performed with this format.  Please refer to the patient's chart for his  consent to telehealth for [facility]. Evaluation Performed:  Follow-up visit  This visit type was conducted due to national recommendations for restrictions regarding the COVID-19 Pandemic (e.g. social distancing).  This format is felt to be most appropriate for this patient at this time.  All issues noted in this document were discussed and addressed.  No physical exam was performed (except for noted visual exam findings with Video Visits).  Please refer to the patient's chart (MyChart message for video visits and phone note for telephone visits) for the patient's consent to telehealth for [facility]. _____________   Date:  04/20/2019   Patient ID:  [name], DOB [birth year], MRN [id number] Patient Location:  Assisted Living Provider location:   Office  Primary Care Provider:  System, Pcp Not In Primary Cardiologist:  Kathlyn Sacramento, MD  Chief Complaint    86 y/o ? with a history of [name], hypertension, hyperlipidemia, chronic back pain, HFpEF, and recent COVID-19 infection, who presents for phone follow-up related to heart failure.  Past Medical History    Past Medical History:  Diagnosis Date  . (HFpEF) heart failure  with preserved ejection fraction ([place])    a. 11/2017 Echo: EF 60-65%, no rwma, GR1 DD. Nl RV fxn.  . Anginal pain ([place])    c/o heaviness a few years ago.  stent placed and all resolved  . Anxiety   . BPH (benign prostatic hypertrophy)   . Carotid stenosis    a. 05/2015 Carotid U/S: mild nonobs atherosclerosis. No need for f/u.  Marland Kitchen Chronic back pain   . Chronic Left Pleural Effusion    a. 11/2017 Mod by CXR.  Marland Kitchen Coronary artery disease    a. 06/2013 Cath/PCI: LAD 50-70p (FFR 0.82-->PCI w/ 3.5x24 Promus DES), RCA subtotally occluded w/ L->R collats, LCX 30ost.  . Dyslipidemia   . GERD (gastroesophageal reflux disease)   . Hx of [name]   . Hyperlipidemia   . Hypertension   . Inguinal hernia   . Lightheadedness    a. chronic, somewhat positional.  . 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 Major depression   . Sleep apnea    will not use cpap   Past Surgical History:  Procedure Laterality Date  . ANTERIOR APPROACH HEMI HIP ARTHROPLASTY Right 04/16/2016   Procedure: ANTERIOR APPROACH HEMI HIP ARTHROPLASTY;  Surgeon: Hessie Knows, MD;  Location: [facility] ORS;  Service: Orthopedics;  Laterality: Right;  . APPENDECTOMY     86 years old  . arm fracture Right 2008   rod in arm  . CARDIAC CATHETERIZATION  2002  . CARDIAC CATHETERIZATION  06/2013   stent placed  . COLONOSCOPY    . EYE  SURGERY Bilateral 2008   cataract extraction  . FEMUR FRACTURE SURGERY Left    with rod  . HEMORROIDECTOMY     75 years ago  . HIP FRACTURE SURGERY    . JOINT REPLACEMENT Bilateral 03/2016   right partial knee replacement, bilateral THR  . KYPHOPLASTY N/A 12/30/2016   Procedure: [id number];  Surgeon: Hessie Knows, MD;  Location: [facility] ORS;  Service: Orthopedics;  Laterality: N/A;    Allergies  Allergies  Allergen Reactions  . Cyclobenzaprine Other (See Comments)    Hallucination. Mental instability.  DO NOT GIVE ANY MUSCLE  RELAXANTS  . Other Other (See Comments)  . Sulfamethoxazole Other (See Comments)  . Baclofen Other (See Comments)    Confusion and weakness  . Nsaids     Avoids due to liver.  . Sulfamethoxazole-Trimethoprim Nausea Only and Other (See Comments)    Patient unaware of this as an allergy  . Tolmetin Other (See Comments)    Avoids due to liver.it is an nsaid    History of Present Illness    [name] is a 86 y.o. male who presents via audio/video conferencing for a telehealth visit today.  As previously noted, he has a history of [name] status post PCI and drug-eluting stent placement to the LAD in May 2015.  He has a known subtotal occlusion of the right coronary artery with mild disease in the left circumflex.  Other history includes hypertension, hyperlipidemia, chronic positional lightheadedness, malignant lymphoplasmacytic lymphoma, GERD, chronic back pain with a history of vertebral fracture status post T10 kyphoplasty, and HFpEF.  Historically, he has had issues with lower extremity edema and increasing abdominal girth requiring adjustment in diuretic therapy, compounded by orthostatic and positional lightheadedness as well as chronic kidney disease.  Since the fall 2020, we have been adjusting his torsemide in the setting of increase in dry weight from the mid to high 190s to 205-210 pounds.  In February, he was admitted to [facility] after an outbreak of COVID-19 at his living facility.  Unfortunately, despite having received the first dose of the vaccine about a week to 10 days prior to the outbreak, [name] did experience some respiratory failure and tested positive for COVID-19.  He was treated on the inpatient service and fortunately did well. During his hospitalization, he had some abdominal pain.  Abdominal ultrasound did not show any acute findings or ascites.  Torsemide was held during hospitalization and he actually needs some IV hydration.  He was subsequently d/c'd off of  torsemide.   Since d/c he has noted steady improvement in strength. He wt has been trending around 201 lbs and he has not had any significant abdominal bloating or lower extremity edema.  His degree of dyspnea on exertion has been stable.  His wife, who also tested positive for Covid but did not require hospitalization, has concerns about their medications being removed from their room recently but overall, she agrees that [name] has been doing better.  Patient was seen in the emergency room yesterday due to concern for swallowing by living facility staff.  In the ED, patient reported that this has been chronic ever since an episode of [name] 40 years ago and did not understand why he was being seen in the ER.  He confirms this today.  No evaluation was performed in the ER given no active complaints and stable symptoms.  The patient does not have symptoms concerning for COVID-19 infection (fever, chills, cough, or new shortness of  breath).   Home Medications    Prior to Admission medications   Medication Sig Start Date End Date Taking? Authorizing Provider  acetaminophen (TYLENOL) 500 MG tablet Take 1,000 mg by mouth 3 (three) times daily.    [provider]  acetaminophen (TYLENOL) 500 MG tablet Take 500 mg by mouth every 6 (six) hours as needed for mild pain or fever.    [provider]  acidophilus (RISAQUAD) CAPS capsule Take 1 capsule by mouth daily.    [provider]  albuterol (VENTOLIN HFA) 108 (90 Base) MCG/ACT inhaler Inhale 2 puffs into the lungs every 6 (six) hours. 03/31/19   Nolberto Hanlon, MD  alum & mag hydroxide-simeth (MINTOX) 200-200-20 MG/5ML suspension Take 30 mLs by mouth every 6 (six) hours as needed for indigestion or heartburn.    [provider]  antiseptic oral rinse (BIOTENE) LIQD 15 mLs by Mouth Rinse route 2 (two) times daily as needed for dry mouth.    [provider]  ascorbic acid (VITAMIN C) 500 MG tablet Take 1  tablet (500 mg total) by mouth daily. 03/31/19   Nolberto Hanlon, MD  Cholecalciferol (VITAMIN D3) 5000 units CAPS Take 5,000 Units by mouth daily.     [provider]  clonazePAM (KLONOPIN) 1 MG tablet Take 1 mg by mouth 2 (two) times daily.     [provider]  diclofenac sodium (VOLTAREN) 1 % GEL Apply 2 g topically 2 (two) times daily as needed (lower back pain). (use when lidocaine patch is off)    [provider]  docusate sodium (COLACE) 100 MG capsule Take 100 mg by mouth 2 (two) times daily.    [provider]  doxazosin (CARDURA) 1 MG tablet Take 1 tablet (1 mg total) by mouth daily. Patient taking differently: Take 1 mg by mouth at bedtime.  05/28/17   Lada, Satira Anis, MD  escitalopram (LEXAPRO) 20 MG tablet Take 20 mg by mouth daily.     [provider]  gabapentin (NEURONTIN) 300 MG capsule Take 300 mg by mouth at bedtime.  11/18/18   [provider]  guaiFENesin (ROBITUSSIN) 100 MG/5ML liquid Take 200 mg by mouth every 6 (six) hours as needed for cough.     [provider]  hydrocortisone 2.5 % cream Apply 1 application topically every other day. (apply to scalp and alternate with ketoconazole) 12/08/18   [provider]  isosorbide mononitrate (IMDUR) 30 MG 24 hr tablet Take 1 tablet (30 mg total) by mouth daily. 02/15/17   Roselee Nova, MD  ketoconazole (NIZORAL) 2 % cream Apply 1 application topically every other day. (apply to scalp and alternate with hydrocortisone) 12/08/18   [provider]  Lidocaine (ASPERCREME LIDOCAINE) 4 % PTCH Apply 1 patch topically 2 (two) times daily.    [provider]  lidocaine (XYLOCAINE) 2 % solution Use as directed 5 mLs in the mouth or throat 3 (three) times daily. 03/31/19   Nolberto Hanlon, MD  loperamide (IMODIUM) 2 MG capsule Take 2 mg by mouth as needed for diarrhea or loose stools. (max 8 doses in 24 hours)    [provider]  Lutein 10 MG TABS Take  10 mg by mouth 2 (two) times daily.    [provider]  magic mouthwash SOLN Take 10 mLs by mouth 3 (three) times daily as needed for mouth pain. 03/31/19   Nolberto Hanlon, MD  Melatonin 5 MG TABS Take 5 mg by mouth at bedtime  as needed (sleep).    [provider]  metoprolol tartrate (LOPRESSOR) 25 MG tablet Take 12.5 mg by mouth 2 (two) times daily.     [provider]  Multiple Vitamins-Iron (MULTIVITAMINS WITH IRON) TABS tablet Take 1 tablet by mouth daily.    [provider]  neomycin-bacitracin-polymyxin (NEOSPORIN) ointment Apply 1 application topically as needed for wound care.    [provider]  nitroGLYCERIN (NITROSTAT) 0.4 MG SL tablet Place 0.4 mg under the tongue every 5 (five) minutes as needed for chest pain.    [provider]  ondansetron (ZOFRAN) 4 MG tablet Take 4 mg by mouth every 6 (six) hours as needed for nausea or vomiting.    [provider]  pantoprazole (PROTONIX) 40 MG tablet Take 1 tablet (40 mg total) by mouth daily. 03/16/17   Roselee Nova, MD  polyethylene glycol [facility]avel[facility]) packet Take 17 g by mouth daily as needed.     [provider]  pravastatin (PRAVACHOL) 40 MG tablet Take 40 mg by mouth at bedtime.    [provider]  senna (SENOKOT) 8.6 MG TABS tablet Take 1 tablet by mouth at bedtime.     [provider]  torsemide (DEMADEX) 20 MG tablet Take 1 tablet (20 mg total) by mouth daily. Take 1 tablet (20 mg) by mouth once a day on Mondays/ Wednesdays/ & Fridays Patient taking differently: Take 20 mg by mouth daily.  03/09/19   Theora Gianotti, NP  traMADol (ULTRAM) 50 MG tablet Take 1 tablet (50 mg total) by mouth every 8 (eight) hours as needed. Patient to carry to his room Patient taking differently: Take 50 mg by mouth 3 (three) times daily.  09/29/18   Triplett, Johnette Abraham B, FNP  triamcinolone cream (KENALOG) 0.1 % Apply 1 application topically 2 (two) times  daily.    [provider]  zinc sulfate 220 (50 Zn) MG capsule Take 1 capsule (220 mg total) by mouth daily. 03/31/19   Nolberto Hanlon, MD    Review of Systems    Chronic dyspnea on exertion which has been stable if not improved since last clinic visits.  He denies chest pain, palpitations, PND, orthopnea, dizziness, syncope, edema, or early satiety.  He has chronic back pain.  All other systems reviewed and are otherwise negative except as noted above.  Physical Exam    Vital Signs:  BP 140/74   Pulse 77   Ht 5\' 10"  (1.778 m)   Wt 201 lb (91.2 kg)   BMI 28.84 kg/m   Pleasant, no acute distress, awake alert and oriented x3.  Respirations are regular unlabored.  Speech is unlabored.  Accessory Clinical Findings   None  Assessment & Plan    1.  Chronic diastolic congestive heart failure: Normal LV function by echocardiogram in October 2019.  Despite struggling with volume status, weight, and abdominal bloating prior to COVID-19 diagnosis, ever since then, he is actually been doing quite well with improvement in dyspnea and no significant abdominal bloating or edema.  He has been off of torsemide since his hospital discharge in February.  As he has been stable, I will leave him off of torsemide for now.  2.  Stage III chronic kidney disease: Creatinine down to 1.29 on February 12.  As noted above, he remains off of torsemide at this time.  3.  Coronary artery disease: No chest pain.  Remains on beta-blocker, nitrate, statin, and aspirin therapy.  4.  Hyperlipidemia:  Remains on Pravachol therapy with an LDL of 30 in April 2020.  5.  Essential hypertension: Blood pressure mildly elevated today but has been stable previously.  6.  Disposition: Would like to see him in clinic in a month to reevaluate.  COVID-19 Education: The signs and symptoms of COVID-19 were discussed with the patient and how to seek care for testing (follow up with PCP or arrange E-visit).  The importance of  social distancing was discussed today.  Patient Risk:   After full review of this patient's history and clinical status, I feel that he is at least moderate risk for cardiac complications at this time, thus necessitating a telehealth visit sooner than our first available in office visit.  Time:   Today, I have spent 18 minutes with the patient with telehealth technology discussing medical history, symptoms, and management plan.     Murray Hodgkins, NP 04/20/2019, 10:01 AM

## 2019-04-21 NOTE — Telephone Encounter (Signed)
Patient wife  returning call   She also has questions about medication they are being given at facility

## 2019-04-21 NOTE — Telephone Encounter (Signed)
Returned the patient's wife call. [email or web address].

## 2019-04-26 ENCOUNTER — Ambulatory Visit: Payer: Medicare Other | Admitting: Nurse Practitioner

## 2019-04-26 ENCOUNTER — Encounter: Payer: Self-pay | Admitting: Family

## 2019-04-26 ENCOUNTER — Other Ambulatory Visit: Payer: Self-pay

## 2019-04-26 ENCOUNTER — Ambulatory Visit (INDEPENDENT_AMBULATORY_CARE_PROVIDER_SITE_OTHER): Payer: Medicare Other | Admitting: Nurse Practitioner

## 2019-04-26 VITALS — BP 110/60 | HR 74 | Ht 70.0 in

## 2019-04-26 DIAGNOSIS — I2510 Atherosclerotic heart disease of native coronary artery without angina pectoris: Secondary | ICD-10-CM

## 2019-04-26 DIAGNOSIS — I5032 Chronic diastolic (congestive) heart failure: Secondary | ICD-10-CM

## 2019-04-26 DIAGNOSIS — E785 Hyperlipidemia, unspecified: Secondary | ICD-10-CM

## 2019-04-26 DIAGNOSIS — N183 Chronic kidney disease, stage 3 unspecified: Secondary | ICD-10-CM | POA: Diagnosis not present

## 2019-04-26 DIAGNOSIS — I10 Essential (primary) hypertension: Secondary | ICD-10-CM

## 2019-04-26 MED ORDER — TORSEMIDE 20 MG PO TABS: ORAL_TABLET | ORAL | 3 refills | Status: AC

## 2019-04-26 MED ORDER — TORSEMIDE 20 MG PO TABS: 20.0000 mg | ORAL_TABLET | Freq: Every day | ORAL | 3 refills | Status: DC

## 2019-04-26 NOTE — Patient Instructions (Signed)
Medication Instructions:  1- START Torsemide  Take 1 tablet (20 mg) by mouth once a day on Mondays/ Wednesdays/ & Fridays *If you need a refill on your cardiac medications before your next appointment, please call your pharmacy*   Lab Work: None ordered If you have labs (blood work) drawn today and your tests are completely normal, you will receive your results only by: Marland Kitchen MyChart Message (if you have MyChart) OR . A paper copy in the mail If you have any lab test that is abnormal or we need to change your treatment, we will call you to review the results.   Testing/Procedures: None ordered    Follow-Up: At [facility], you and your health needs are our priority.  As part of our continuing mission to provide you with exceptional heart care, we have created designated Provider Care Teams.  These Care Teams include your primary Cardiologist (physician) and Advanced Practice Providers (APPs -  Physician Assistants and Nurse Practitioners) who all work together to provide you with the care you need, when you need it.  We recommend signing up for the patient portal called "MyChart".  Sign up information is provided on this After Visit Summary.  MyChart is used to connect with patients for Virtual Visits (Telemedicine).  Patients are able to view lab/test results, encounter notes, upcoming appointments, etc.  Non-urgent messages can be sent to your provider as well.   To learn more about what you can do with MyChart, go to [email or web address].    Your next appointment:   3 month(s)  The format for your next appointment:   In Person  Provider:    You may see Kathlyn Sacramento, MD or Murray Hodgkins, NP

## 2019-04-26 NOTE — Telephone Encounter (Signed)
Patient and wife here for [place] visit today. Closing this encounter.

## 2019-04-26 NOTE — Progress Notes (Signed)
Office Visit    Patient Name: [name] Date of Encounter: 04/26/2019  Primary Care Provider:  System, Pcp Not In Primary Cardiologist:  Brandon Sacramento, MD  Chief Complaint    86 year old male with a history of [name], hypertension, hyperlipidemia, chronic back pain, HFpEF, and recent COVID-19 infection, who presents for follow-up to discuss medications.  Past Medical History    Past Medical History:  Diagnosis Date  . (HFpEF) heart failure with preserved ejection fraction ([name])    a. 11/2017 Echo: EF 60-65%, no rwma, GR1 DD. Nl RV fxn.  . Anginal pain ([name])    c/o heaviness a few years ago.  stent placed and all resolved  . Anxiety   . BPH (benign prostatic hypertrophy)   . Carotid stenosis    a. 05/2015 Carotid U/S: mild nonobs atherosclerosis. No need for f/u.  Marland Kitchen Chronic back pain   . Chronic Left Pleural Effusion    a. 11/2017 Mod by CXR.  Marland Kitchen Coronary artery disease    a. 06/2013 Cath/PCI: LAD 50-70p (FFR 0.82-->PCI w/ 3.5x24 Promus DES), RCA subtotally occluded w/ L->R collats, LCX 30ost.  . Dyslipidemia   . GERD (gastroesophageal reflux disease)   . Hx of [name]   . Hyperlipidemia   . Hypertension   . Inguinal hernia   . Lightheadedness    a. chronic, somewhat positional.  . Lymphoblastic lymphoma ([name]) 1998   turned out to be something else in same family but not a problem  . Major depression   . Sleep apnea    will not use cpap   Past Surgical History:  Procedure Laterality Date  . ANTERIOR APPROACH HEMI HIP ARTHROPLASTY Right 04/16/2016   Procedure: ANTERIOR APPROACH HEMI HIP ARTHROPLASTY;  Surgeon: Brandon Knows, MD;  Location: [facility] ORS;  Service: Orthopedics;  Laterality: Right;  . APPENDECTOMY     86 years old  . arm fracture Right 2008   rod in arm  . CARDIAC CATHETERIZATION  2002  . CARDIAC CATHETERIZATION  06/2013   stent placed  . COLONOSCOPY    . EYE SURGERY Bilateral 2008   cataract extraction  . FEMUR FRACTURE SURGERY Left    with rod    . HEMORROIDECTOMY     75 years ago  . HIP FRACTURE SURGERY    . JOINT REPLACEMENT Bilateral 03/2016   right partial knee replacement, bilateral THR  . KYPHOPLASTY N/A 12/30/2016   Procedure: [id number];  Surgeon: Brandon Knows, MD;  Location: [facility] ORS;  Service: Orthopedics;  Laterality: N/A;    Allergies  Allergies  Allergen Reactions  . Cyclobenzaprine Other (See Comments)    Hallucination. Mental instability.  DO NOT GIVE ANY MUSCLE RELAXANTS  . Other Other (See Comments)  . Sulfamethoxazole Other (See Comments)  . Baclofen Other (See Comments)    Confusion and weakness  . Nsaids     Avoids due to liver.  . Sulfamethoxazole-Trimethoprim Nausea Only and Other (See Comments)    Patient unaware of this as an allergy  . Tolmetin Other (See Comments)    Avoids due to liver.it is an nsaid    History of Present Illness    [name] is a 86 y.o. male who presents via audio/video conferencing for a telehealth visit today.  As previously noted, he has a history of [name] status post PCI and drug-eluting stent placement to the LAD in May 2015. He has a known subtotal occlusion of the right coronary artery with mild disease in the left  circumflex. Other history includes hypertension, hyperlipidemia, chronic positional lightheadedness, malignant lymphoplasmacytic lymphoma, GERD, chronic back pain with a history of vertebral fracture status post T10 kyphoplasty, and HFpEF. Historically, he has had issues with lower extremity edema and increasing abdominal girth requiring adjustment in diuretic therapy, compounded by orthostatic and positional lightheadedness as well as chronic kidney disease. Since the fall 2020, we have been adjusting his torsemide in the setting of increase in dry weight from the mid to high 190s to 205-210 pounds.  In February, he was admitted to [facility] after an outbreak of COVID-19 at his living facility.  Unfortunately, despite having received the first  dose of the vaccine about a week to 10 days prior to the outbreak, [name] did experience some respiratory failure and tested positive for COVID-19.  He was treated on the inpatient service and fortunately did well. During his hospitalization, he had some abdominal pain.  Abdominal ultrasound did not show any acute findings or ascites.  Torsemide was held during hospitalization and he actually needed some IV hydration.    It was his impression that he was discharged off of torsemide however, it appears he has been taking on Mondays, Wednesdays, and Fridays.  At telehealth visit 1 week ago he reported feeling exceptionally well with weights trending in the low 200s and overall improvement in abdominal girth.  He and his wife have been concerned the facility at which they live has taken control over all of their medications and so there confused about what they are being given and when.  From a symptomatic standpoint, he has been relatively stable though notes that he has been a little more short of breath with activity.  Weight has been stable at 202 pounds.  He thinks he just needs more physical therapy.  He denies chest pain, palpitations, PND, orthopnea, dizziness, syncope, edema, or early satiety.  Home Medications    Prior to Admission medications   Medication Sig Start Date End Date Taking? Authorizing Provider  acetaminophen (TYLENOL) 500 MG tablet Take 1,000 mg by mouth 3 (three) times daily.   Yes [provider]  acetaminophen (TYLENOL) 500 MG tablet Take 500 mg by mouth every 6 (six) hours as needed for mild pain or fever.   Yes [provider]  acidophilus (RISAQUAD) CAPS capsule Take 1 capsule by mouth daily.   Yes [provider]  albuterol (VENTOLIN HFA) 108 (90 Base) MCG/ACT inhaler Inhale 2 puffs into the lungs every 6 (six) hours. 03/31/19  Yes Nolberto Hanlon, MD  alum & mag hydroxide-simeth ([place]) 200-200-20 MG/5ML suspension Take 30 mLs by mouth every 6  (six) hours as needed for indigestion or heartburn.   Yes [provider]  antiseptic oral rinse (BIOTENE) LIQD 15 mLs by Mouth Rinse route 2 (two) times daily as needed for dry mouth.   Yes [provider]  apixaban (ELIQUIS) 2.5 MG TABS tablet Take 2.5 mg by mouth 2 (two) times daily.   Yes [provider]  ascorbic acid (VITAMIN C) 500 MG tablet Take 1 tablet (500 mg total) by mouth daily. 03/31/19  Yes Nolberto Hanlon, MD  Cholecalciferol (VITAMIN D3) 5000 units CAPS Take 5,000 Units by mouth daily.    Yes [provider]  clonazePAM (KLONOPIN) 1 MG tablet Take 1 mg by mouth in the morning, at noon, and at bedtime.    Yes [provider]  diclofenac sodium (VOLTAREN) 1 % GEL Apply 2 g topically 2 (two) times daily as needed (lower  back pain). (use when lidocaine patch is off)   Yes [provider]  docusate sodium (COLACE) 100 MG capsule Take 100 mg by mouth 2 (two) times daily.   Yes [provider]  doxazosin (CARDURA) 1 MG tablet Take 1 tablet (1 mg total) by mouth daily. Patient taking differently: Take 1 mg by mouth at bedtime.  05/28/17  Yes Lada, Satira Anis, MD  escitalopram (LEXAPRO) 20 MG tablet Take 20 mg by mouth daily.    Yes [provider]  gabapentin (NEURONTIN) 300 MG capsule Take 300 mg by mouth at bedtime.  11/18/18  Yes [provider]  guaiFENesin (ROBITUSSIN) 100 MG/5ML liquid Take 200 mg by mouth every 6 (six) hours as needed for cough.    Yes [provider]  hydrocortisone 2.5 % cream Apply 1 application topically every other day. (apply to scalp and alternate with ketoconazole) 12/08/18  Yes [provider]  isosorbide mononitrate (IMDUR) 30 MG 24 hr tablet Take 1 tablet (30 mg total) by mouth daily. 02/15/17  Yes Roselee Nova, MD  ketoconazole (NIZORAL) 2 % cream Apply 1 application topically every other day.   Yes [provider]  LACTOBACILLUS PO Take by mouth  daily.   Yes [provider]  Lidocaine (ASPERCREME LIDOCAINE) 4 % PTCH Apply 1 patch topically 2 (two) times daily.   Yes [provider]  lidocaine (XYLOCAINE) 2 % solution Use as directed 5 mLs in the mouth or throat 3 (three) times daily. 03/31/19  Yes Nolberto Hanlon, MD  loperamide (IMODIUM) 2 MG capsule Take 2 mg by mouth as needed for diarrhea or loose stools. (max 8 doses in 24 hours)   Yes [provider]  Lutein 10 MG TABS Take 10 mg by mouth 2 (two) times daily.   Yes [provider]  magic mouthwash SOLN Take 10 mLs by mouth 3 (three) times daily as needed for mouth pain. 03/31/19  Yes Nolberto Hanlon, MD  Melatonin 5 MG TABS Take by mouth at bedtime as needed.   Yes [provider]  metoprolol tartrate (LOPRESSOR) 25 MG tablet Take 12.5 mg by mouth 2 (two) times daily.    Yes [provider]  Multiple Vitamins-Iron (MULTIVITAMINS WITH IRON) TABS tablet Take 1 tablet by mouth daily.   Yes [provider]  neomycin-polymyxin b-dexamethasone (MAXITROL) [id number] OINT 1 application as needed.   Yes [provider]  nitroGLYCERIN (NITROSTAT) 0.4 MG SL tablet Place 0.4 mg under the tongue every 5 (five) minutes as needed for chest pain.   Yes [provider]  ondansetron (ZOFRAN) 4 MG tablet Take 4 mg by mouth every 6 (six) hours as needed for nausea or vomiting.   Yes [provider]  pantoprazole (PROTONIX) 40 MG tablet Take 1 tablet (40 mg total) by mouth daily. 03/16/17  Yes Roselee Nova, MD  Phenylephrine-Cocoa Butter (PREPARATION H RE) Place rectally 4 (four) times daily as needed.   Yes [provider]  polyethylene glycol (MIRALAX / GLYCOLAX) packet Take 17 g by mouth daily as needed.    Yes [provider]  pravastatin (PRAVACHOL) 40 MG tablet Take 40 mg by mouth at bedtime.   Yes [provider]  senna (SENOKOT) 8.6 MG TABS tablet Take 1 tablet by mouth at bedtime.     Yes [provider]  torsemide (DEMADEX) 20 MG tablet Take 1 tablet (20 mg) by mouth once a day on Mondays/ Wednesdays/ & Fridays 04/26/19  Yes Loel Dubonnet, NP  traMADol (ULTRAM) 50 MG tablet Take 1 tablet (50 mg total) by mouth every 8 (eight) hours as needed. Patient to carry to his room Patient taking differently: Take 50 mg by mouth 3 (three) times daily.  09/29/18  Yes Triplett, Cari B, FNP  triamcinolone cream (KENALOG) 0.1 % Apply 1 application topically 2 (two) times daily.   Yes [provider]  zinc sulfate 220 (50 Zn) MG capsule Take 1 capsule (220 mg total) by mouth daily. 03/31/19  Yes Nolberto Hanlon, MD  zinc sulfate 220 (50 Zn) MG capsule Take 220 mg by mouth daily.   Yes [provider]    Review of Systems    He has chronic dyspnea on exertion which might be slightly worse over the past week.  He denies chest pain, palpitations, PND, orthopnea, dizziness, syncope, edema, or early satiety.  All other systems reviewed and are otherwise negative except as noted above.  Physical Exam    VS:  BP 110/60 (BP Location: Right Arm, Patient Position: Sitting, Cuff Size: Normal)   Pulse 74   Ht 5\' 10"  (1.778 m)   SpO2 93%   BMI 28.84 kg/m  , BMI Body mass index is 28.84 kg/m. GEN: Well nourished, well developed, in no acute distress. HEENT: normal. Neck: Supple, no JVD, carotid bruits, or masses. Cardiac: RRR, no murmurs, rubs, or gallops. No clubbing, cyanosis, edema.  Radials/PT 1+ and equal bilaterally.  Respiratory:  Respirations regular and unlabored, diminished breath sounds bilaterally. GI: Soft, nontender, nondistended, BS + x 4. MS: no deformity or atrophy. Skin: warm and dry, no rash. Neuro:  Strength and sensation are intact. Psych: Normal affect.  Accessory Clinical Findings    ECG personally reviewed by me today -regular sinus rhythm, 74, first-degree AV block- no acute changes.  Lab Results  Component Value Date   WBC 11.1 (H)  03/31/2019   HGB 10.9 (L) 03/31/2019   HCT 33.8 (L) 03/31/2019   MCV 91.1 03/31/2019   PLT 152 03/31/2019   Lab Results  Component Value Date   CREATININE 1.29 (H) 03/31/2019   BUN 48 (H) 03/31/2019   NA 139 03/31/2019   K 4.0 03/31/2019   CL 100 03/31/2019   CO2 27 03/31/2019   Lab Results  Component Value Date   ALT 25 03/31/2019   AST 51 (H) 03/31/2019   ALKPHOS 63 03/31/2019   BILITOT 0.7 03/31/2019   Lab Results  Component Value Date   CHOL 68 02/19/2016   HDL 20 (L) 02/19/2016   LDLCALC 23 02/19/2016   TRIG 102 03/27/2019   CHOLHDL 3.4 02/19/2016    Lab Results  Component Value Date   HGBA1C 6.4 (H) 03/27/2019    Assessment & Plan    1.  Chronic diastolic congestive heart failure: Normal LV function by echocardiogram in October 2019.  He is euvolemic on examination today.  He has had some increase in baseline level of dyspnea on exertion over the past week.  He feels he is deconditioned and would benefit from physical therapy and I agree.  Though he previously reported being off of torsemide, review of his medication list shows that he has been receiving 20 mg on MWF since hospital discharge, though pt wasn't aware of this today.  Potassium and renal function were stable on February 12.  Heart rate and blood pressure stable.  No changes to medications today.  2.  Stage III chronic kidney disease: Creatinine 1.29 February 12.  He remains on torsemide 20 mg Monday, Wednesday, Friday.  3.  Coronary artery disease: No chest pain.  Continue beta-blocker, nitrate, statin.  4.  Hyperlipidemia: He remains on Pravachol therapy with an LDL of 30 in April 2020.  5.  Essential hypertension: Stable.  6.  Oral anticoagulation: After Mr. Jerkins left today, we were finally able to obtain and evaluate his medicine list from [facility].  I noted that he is no longer on aspirin and instead, he is taking Eliquis 2.5 mg twice daily.  Based on the [facility], it appears that it was most  likely started while he was in rehab, because he was not on it at hospital discharge.  Neither he nor his wife mentioned this or seemed to be aware of this.  I have a call out to [facility] at this time and am awaiting callback in order to clarify why he is currently on Eliquis.  7.  Disposition: Awaiting callback from [facility] regarding oral anticoagulation.  He will follow-up in 3 months or sooner if necessary.  Murray Hodgkins, NP 04/26/2019, 4:35 PM

## 2019-05-03 ENCOUNTER — Non-Acute Institutional Stay: Payer: Medicare Other | Admitting: Adult Health Nurse Practitioner

## 2019-05-03 DIAGNOSIS — I5033 Acute on chronic diastolic (congestive) heart failure: Secondary | ICD-10-CM

## 2019-05-03 DIAGNOSIS — Z515 Encounter for palliative care: Secondary | ICD-10-CM

## 2019-05-03 NOTE — Progress Notes (Signed)
[facility] Pall[facility] Consult Note Telephone: [phone]  Fax: [phone]  PATIENT NAME: [name] DOB: [birth year] MRN: [id number]  PRIMARY CARE PROVIDER:   [facility]  REFERRING PROVIDER:  Thea Gist NP  RESPONSIBLE PARTY:   Shawnie Dapper, son/POA [phone] Wendelin Moskos, wife Moroni, daughter [phone]    RECOMMENDATIONS and PLAN:  1.  Advanced care planning. Patient is a DNR  2.  Dysphagia.  Patient was sent to ED on 04/19/19 for evaluation when he had a choking episode after having difficulty swallowing a pill.  ED note after call with the son indicated that this is not new and that he has these episodes every now and then after having [name] 40 years ago.  Patient states that a large pill got stuck in his throat and that it burned.  States that it is still irritated.  He has been having a cough for a few days and his wife has been requesting Robitussin for him for the past couple of days.  States that he has been having post nasal drainage which has been making him cough. States that he coughs anytime of day not when he is eating.  Cough does not seem to bother him at night.  Believe that seasonal allergies are contributing to his cough which may be causing continued irritation to his throat.  Ordered Flonase 2 puffs each nostril daily for seasonal allergies for 14 days to see if this will help with the cough.  3.  Functional status.  Patient is able to walk short distances with a walker.  He does require assistance with ADLs.  Denies falls.  This seems to be his baseline.  Continue supportive care at the facility  4.  Nutritional status.  Patient's appetite is good.  Has had some weight loss from 212 pounds and now is 201 pounds.  Did have COVID in February and this may have contributed to the weight loss but now is stable around 201 to 205.  Does have occasional constipation and takes Miralax which is effective.   Continue supportive care at the facility  Palliative care will monitor for symptom management/decline and make recommendations as needed. Follow up in 4-6 weeks and as needed  I spent 35 minutes providing this consultation,  from 9:30 to 10:05 including time with patient/family, chart review, provider coordination, and documentation. More than 50% of the time in this consultation was spent coordinating communication.   HISTORY OF PRESENT ILLNESS:  [name] is a 86 y.o. year old male with multiple medical problems including HF with preserved EF, BPH, carotid stenosis, CKD, [name], chronic back pain, anxiety, h/o [name].patient was treated at hospital for [place] 2/8-2/01/2020. Palliative Care was asked to help address goals of care.   CODE STATUS: DNR  PPS: 50% HOSPICE ELIGIBILITY/DIAGNOSIS: TBD  PHYSICAL EXAM:   General: NAD, frail appearing Cardiovascular: regular rate and rhythm Pulmonary: lung sounds clear; normal respiratory effort Abdomen: soft, nontender, + bowel sounds GU: no suprapubic tenderness Extremities: no edema, no joint deformities Skin: no rashes Neurological: Weakness but otherwise nonfocal   PAST MEDICAL HISTORY:  Past Medical History:  Diagnosis Date  . (HFpEF) heart failure with preserved ejection fraction ([place])    a. 11/2017 Echo: EF 60-65%, no rwma, GR1 DD. Nl RV fxn.  . Anginal pain ([place])    c/o heaviness a few years ago.  stent placed and all resolved  . Anxiety   . BPH (benign prostatic hypertrophy)   .  Carotid stenosis    a. 05/2015 Carotid U/S: mild nonobs atherosclerosis. No need for f/u.  Marland Kitchen Chronic back pain   . Chronic Left Pleural Effusion    a. 11/2017 Mod by CXR.  Marland Kitchen Coronary artery disease    a. 06/2013 Cath/PCI: LAD 50-70p (FFR 0.82-->PCI w/ 3.5x24 Promus DES), RCA subtotally occluded w/ L->R collats, LCX 30ost.  . Dyslipidemia   . GERD (gastroesophageal reflux disease)   . Hx of [name]   . Hyperlipidemia   . Hypertension   .  Inguinal hernia   . Lightheadedness    a. chronic, somewhat positional.  . Lymphoblastic lymphoma ([place]) 1998   turned out to be something else in same family but not a problem  . Major depression   . Sleep apnea    will not use cpap    SOCIAL HX:  Social History   Tobacco Use  . Smoking status: Former Smoker    Packs/day: 2.00    Years: 7.00    Pack years: 14.00    Types: Cigarettes    Quit date: 09/17/1953    Years since quitting: 65.6  . Smokeless tobacco: Never Used  Substance Use Topics  . Alcohol use: No    Alcohol/week: 0.0 standard drinks    ALLERGIES:  Allergies  Allergen Reactions  . Cyclobenzaprine Other (See Comments)    Hallucination. Mental instability.  DO NOT GIVE ANY MUSCLE RELAXANTS  . Other Other (See Comments)  . Sulfamethoxazole Other (See Comments)  . Baclofen Other (See Comments)    Confusion and weakness  . Nsaids     Avoids due to liver.  . Sulfamethoxazole-Trimethoprim Nausea Only and Other (See Comments)    Patient unaware of this as an allergy  . Tolmetin Other (See Comments)    Avoids due to liver.it is an nsaid     PERTINENT MEDICATIONS:  Outpatient Encounter Medications as of 05/03/2019  Medication Sig  . acetaminophen (TYLENOL) 500 MG tablet Take 1,000 mg by mouth 3 (three) times daily.  Marland Kitchen acidophilus (RISAQUAD) CAPS capsule Take 1 capsule by mouth daily.  Marland Kitchen albuterol (VENTOLIN HFA) 108 (90 Base) MCG/ACT inhaler Inhale 2 puffs into the lungs every 6 (six) hours.  Marland Kitchen alum & mag hydroxide-simeth ([place]TO[place]) [id number] MG/5ML suspension Take 30 mLs by mouth every 6 (six) hours as needed for indigestion or heartburn.  Marland Kitchen antiseptic oral rinse (BIOTENE) LIQD 15 mLs by Mouth Rinse route 2 (two) times daily as needed for dry mouth.  Marland Kitchen apixaban (ELIQUIS) 2.5 MG TABS tablet Take 2.5 mg by mouth 2 (two) times daily.  Marland Kitchen ascorbic acid (VITAMIN C) 500 MG tablet Take 1 tablet (500 mg total) by mouth daily.  . Cholecalciferol (VITAMIN D3) 5000 units CAPS  Take 5,000 Units by mouth daily.   . clonazePAM (KLONOPIN) 1 MG tablet Take 1 mg by mouth in the morning, at noon, and at bedtime.   . diclofenac sodium (VOLTAREN) 1 % GEL Apply 2 g topically 2 (two) times daily as needed (lower back pain). (use when lidocaine patch is off)  . docusate sodium (COLACE) 100 MG capsule Take 100 mg by mouth 2 (two) times daily.  Marland Kitchen doxazosin (CARDURA) 1 MG tablet Take 1 tablet (1 mg total) by mouth daily. (Patient taking differently: Take 1 mg by mouth at bedtime. )  . escitalopram (LEXAPRO) 20 MG tablet Take 20 mg by mouth daily.   Marland Kitchen gabapentin (NEURONTIN) 300 MG capsule Take 300 mg by mouth at bedtime.   Marland Kitchen guaiFENesin (  ROBITUSSIN) 100 MG/5ML liquid Take 200 mg by mouth every 6 (six) hours as needed for cough.   . hydrocortisone 2.5 % cream Apply 1 application topically every other day. (apply to scalp and alternate with ketoconazole)  . isosorbide mononitrate (IMDUR) 30 MG 24 hr tablet Take 1 tablet (30 mg total) by mouth daily.  Marland Kitchen ketoconazole (NIZORAL) 2 % cream Apply 1 application topically every other day.  Marland Kitchen LACTOBACILLUS PO Take by mouth daily.  . Lidocaine (ASPERCREME LIDOCAINE) 4 % PTCH Apply 1 patch topically 2 (two) times daily.  Marland Kitchen lidocaine (XYLOCAINE) 2 % solution Use as directed 5 mLs in the mouth or throat 3 (three) times daily.  Marland Kitchen loperamide (IMODIUM) 2 MG capsule Take 2 mg by mouth as needed for diarrhea or loose stools. (max 8 doses in 24 hours)  . Lutein 10 MG TABS Take 10 mg by mouth 2 (two) times daily.  . magic mouthwash SOLN Take 10 mLs by mouth 3 (three) times daily as needed for mouth pain.  . Melatonin 5 MG TABS Take by mouth at bedtime as needed.  . metoprolol tartrate (LOPRESSOR) 25 MG tablet Take 12.5 mg by mouth 2 (two) times daily.   . Multiple Vitamins-Iron (MULTIVITAMINS WITH IRON) TABS tablet Take 1 tablet by mouth daily.  Marland Kitchen neomycin-polymyxin b-dexamethasone (MAXITROL) [id number] OINT 1 application as needed.  . nitroGLYCERIN  (NITROSTAT) 0.4 MG SL tablet Place 0.4 mg under the tongue every 5 (five) minutes as needed for chest pain.  Marland Kitchen ondansetron (ZOFRAN) 4 MG tablet Take 4 mg by mouth every 6 (six) hours as needed for nausea or vomiting.  . pantoprazole (PROTONIX) 40 MG tablet Take 1 tablet (40 mg total) by mouth daily.  Marland Kitchen Phenylephrine-Cocoa Butter (PREPARATION H RE) Place rectally 4 (four) times daily as needed.  . polyethylene glycol (MIRALAX / GLYCOLAX) packet Take 17 g by mouth daily as needed.   . pravastatin (PRAVACHOL) 40 MG tablet Take 40 mg by mouth at bedtime.  . senna (SENOKOT) 8.6 MG TABS tablet Take 1 tablet by mouth at bedtime.   . torsemide (DEMADEX) 20 MG tablet Take 1 tablet (20 mg) by mouth once a day on Mondays/ Wednesdays/ & Fridays  . traMADol (ULTRAM) 50 MG tablet Take 1 tablet (50 mg total) by mouth every 8 (eight) hours as needed. Patient to carry to his room (Patient taking differently: Take 50 mg by mouth 3 (three) times daily. )  . triamcinolone cream (KENALOG) 0.1 % Apply 1 application topically 2 (two) times daily.  Marland Kitchen zinc sulfate 220 (50 Zn) MG capsule Take 220 mg by mouth daily.   No facility-administered encounter medications on file as of 05/03/2019.     Ninoshka Wainwright Jenetta Downer, NP

## 2019-05-04 ENCOUNTER — Other Ambulatory Visit: Payer: Self-pay

## 2019-05-16 ENCOUNTER — Telehealth: Payer: Self-pay | Admitting: Nurse Practitioner

## 2019-05-16 NOTE — Telephone Encounter (Signed)
I had called [facility] on several occasions to talk with a nurse, in order to understand the indication for his eliquis rx. As my last clinic note indicates, we did not have his med records @ the time of the visit, and were only able to review afterward.  [facility] d/c summary does not list eliquis, thus it appears that this was rx while he was in rehab. Does Mrs Beltrame know what occurred that resulted in this being rx? Despite my previous attempts @ contacting [facility], operators always told me that phone  Lines were down and I was never able to get an answer.

## 2019-05-16 NOTE — Telephone Encounter (Signed)
Pt c/o medication issue:  1. Name of Medication: Eliquis  2. How are you currently taking this medication (dosage and times per day)? 2.5mg  bid  3. Are you having a reaction (difficulty breathing--STAT)?   4. What is your medication issue? Patient has been taking eliquis since 2/8 (since hospital admission). Patient's wife wants provider to be aware that patient is taking this medication. Wife did not think Ignacia Bayley was aware since this happened while in the hospital.  Please advise

## 2019-05-17 ENCOUNTER — Other Ambulatory Visit: Payer: Self-pay

## 2019-05-17 ENCOUNTER — Non-Acute Institutional Stay: Payer: Medicare Other | Admitting: Adult Health Nurse Practitioner

## 2019-05-17 DIAGNOSIS — Z515 Encounter for palliative care: Secondary | ICD-10-CM

## 2019-05-17 DIAGNOSIS — I5033 Acute on chronic diastolic (congestive) heart failure: Secondary | ICD-10-CM

## 2019-05-17 NOTE — Progress Notes (Signed)
[facility] Palliative Care Consult Note Telephone: [phone]  Fax: [phone]  PATIENT NAME: [name] DOB: May 11, 1923 MRN: [id number]  PRIMARY CARE PROVIDER:   [name]  REFERRING PROVIDER: Thea Gist NP  RESPONSIBLE PARTY:   [name], son/POA [phone] [name], wife [name], daughter [phone]    RECOMMENDATIONS and PLAN:  1.  Advanced care planning. Patient is a DNR  2.  Dysphagia.  Patient denies any more irritation in his throat.  States that his cough has gone away.  Has stable DOE.  ST was ordered but spoke with staff who state that this has been declined as he does not think he needs it. Patient is working with PT through [facility].  Of note patient states that he never received the flonase ordered 2 weeks ago.  Have talked with staff who states that he has been getting it and showed provider the bottle.  3.  Pain.  Patient has arthritis pain in his knees.  Gets relief with Aspercreme and tylenol.  Continue current pain regimen  4.  Functional status.  Patient is able to walk short distances with a walker.  He does require assistance with ADLs.  Denies falls. Patient has started PT, see above.  States that he has problems putting on his socks and shoes.  He may benefit from OT as well.  Will have RN navigator reach out to [name] to see if they can add OT.  Overall patient had no new medical concerns.  Denies fever, increased SOB, N/V/D, constipation.  No reported falls, infections, or hospital visits since last visit.  Patient and wife had lots of concerns about facility related matters.  Development worker, international aid not at facility today but did send her an email about their concerns.  Palliative will continue to monitor for symptom management/decline and make recommendations as needed.  Will follow up in 4-6weeks.  I spent 50 minutes providing this consultation,  from 9:50 to 10:40 including time  with patient/family, chart review, provider coordination, and documentation. More than 50% of the time in this consultation was spent coordinating communication.   HISTORY OF PRESENT ILLNESS:  [name] is a 86 y.o. year old male with multiple medical problems including HF with preserved EF, BPH, carotid stenosis, CKD, [name], chronic back pain, anxiety, h/o [name].patient was treated at hospital for [name] 2/8-2/01/2020. Palliative Care was asked to help address goals of care.   CODE STATUS: DNR  PPS: 50% HOSPICE ELIGIBILITY/DIAGNOSIS: TBD  PHYSICAL EXAM:   General: NAD, frail appearing Cardiovascular: regular rate and rhythm Pulmonary: lung sounds clear; normal respiratory effort Abdomen: soft, nontender, + bowel sounds GU: no suprapubic tenderness Extremities: no edema, no joint deformities Skin: no rashes Neurological: Weakness but otherwise nonfocal  PAST MEDICAL HISTORY:  Past Medical History:  Diagnosis Date  . (HFpEF) heart failure with preserved ejection fraction ([place])    a. 11/2017 Echo: EF 60-65%, no rwma, GR1 DD. Nl RV fxn.  . Anginal pain ([place])    c/o heaviness a few years ago.  stent placed and all resolved  . Anxiety   . BPH (benign prostatic hypertrophy)   . Carotid stenosis    a. 05/2015 Carotid U/S: mild nonobs atherosclerosis. No need for f/u.  Marland Kitchen Chronic back pain   . Chronic Left Pleural Effusion    a. 11/2017 Mod by CXR.  Marland Kitchen Coronary artery disease    a. 06/2013 Cath/PCI: LAD 50-70p (FFR 0.82-->PCI w/ 3.5x24 Promus DES),  RCA subtotally occluded w/ L->R collats, LCX 30ost.  . Dyslipidemia   . GERD (gastroesophageal reflux disease)   . Hx of [name]   . Hyperlipidemia   . Hypertension   . Inguinal hernia   . Lightheadedness    a. chronic, somewhat positional.  . 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 Major depression   . Sleep apnea    will not use cpap    SOCIAL HX:  Social  History   Tobacco Use  . Smoking status: Former Smoker    Packs/day: 2.00    Years: 7.00    Pack years: 14.00    Types: Cigarettes    Quit date: 09/17/1953    Years since quitting: 65.7  . Smokeless tobacco: Never Used  Substance Use Topics  . Alcohol use: No    Alcohol/week: 0.0 standard drinks    ALLERGIES:  Allergies  Allergen Reactions  . Cyclobenzaprine Other (See Comments)    Hallucination. Mental instability.  DO NOT GIVE ANY MUSCLE RELAXANTS  . Other Other (See Comments)  . Sulfamethoxazole Other (See Comments)  . Baclofen Other (See Comments)    Confusion and weakness  . Nsaids     Avoids due to liver.  . Sulfamethoxazole-Trimethoprim Nausea Only and Other (See Comments)    Patient unaware of this as an allergy  . Tolmetin Other (See Comments)    Avoids due to liver.it is an nsaid     PERTINENT MEDICATIONS:  Outpatient Encounter Medications as of 05/17/2019  Medication Sig  . acetaminophen (TYLENOL) 500 MG tablet Take 1,000 mg by mouth 3 (three) times daily.  Marland Kitchen acidophilus (RISAQUAD) CAPS capsule Take 1 capsule by mouth daily.  Marland Kitchen albuterol (VENTOLIN HFA) 108 (90 Base) MCG/ACT inhaler Inhale 2 puffs into the lungs every 6 (six) hours.  Marland Kitchen alum & mag hydroxide-simeth (MINTOX) [id number] MG/5ML suspension Take 30 mLs by mouth every 6 (six) hours as needed for indigestion or heartburn.  Marland Kitchen antiseptic oral rinse (BIOTENE) LIQD 15 mLs by Mouth Rinse route 2 (two) times daily as needed for dry mouth.  Marland Kitchen apixaban (ELIQUIS) 2.5 MG TABS tablet Take 2.5 mg by mouth 2 (two) times daily.  Marland Kitchen ascorbic acid (VITAMIN C) 500 MG tablet Take 1 tablet (500 mg total) by mouth daily.  . Cholecalciferol (VITAMIN D3) 5000 units CAPS Take 5,000 Units by mouth daily.   . clonazePAM (KLONOPIN) 1 MG tablet Take 1 mg by mouth in the morning, at noon, and at bedtime.   . diclofenac sodium (VOLTAREN) 1 % GEL Apply 2 g topically 2 (two) times daily as needed (lower back pain). (use when lidocaine  patch is off)  . docusate sodium (COLACE) 100 MG capsule Take 100 mg by mouth 2 (two) times daily.  Marland Kitchen doxazosin (CARDURA) 1 MG tablet Take 1 tablet (1 mg total) by mouth daily. (Patient taking differently: Take 1 mg by mouth at bedtime. )  . escitalopram (LEXAPRO) 20 MG tablet Take 20 mg by mouth daily.   Marland Kitchen gabapentin (NEURONTIN) 300 MG capsule Take 300 mg by mouth at bedtime.   Marland Kitchen guaiFENesin (ROBITUSSIN) 100 MG/5ML liquid Take 200 mg by mouth every 6 (six) hours as needed for cough.   . hydrocortisone 2.5 % cream Apply 1 application topically every other day. (apply to scalp and alternate with ketoconazole)  . isosorbide mononitrate (IMDUR) 30 MG 24 hr tablet Take 1 tablet (30 mg total) by  mouth daily.  Marland Kitchen ketoconazole (NIZORAL) 2 % cream Apply 1 application topically every other day.  Marland Kitchen LACTOBACILLUS PO Take by mouth daily.  . Lidocaine (ASPERCREME LIDOCAINE) 4 % PTCH Apply 1 patch topically 2 (two) times daily.  Marland Kitchen lidocaine (XYLOCAINE) 2 % solution Use as directed 5 mLs in the mouth or throat 3 (three) times daily.  Marland Kitchen loperamide (IMODIUM) 2 MG capsule Take 2 mg by mouth as needed for diarrhea or loose stools. (max 8 doses in 24 hours)  . Lutein 10 MG TABS Take 10 mg by mouth 2 (two) times daily.  . magic mouthwash SOLN Take 10 mLs by mouth 3 (three) times daily as needed for mouth pain.  . Melatonin 5 MG TABS Take by mouth at bedtime as needed.  . metoprolol tartrate (LOPRESSOR) 25 MG tablet Take 12.5 mg by mouth 2 (two) times daily.   . Multiple Vitamins-Iron (MULTIVITAMINS WITH IRON) TABS tablet Take 1 tablet by mouth daily.  Marland Kitchen neomycin-polymyxin b-dexamethasone (MAXITROL) [id number] OINT 1 application as needed.  . nitroGLYCERIN (NITROSTAT) 0.4 MG SL tablet Place 0.4 mg under the tongue every 5 (five) minutes as needed for chest pain.  Marland Kitchen ondansetron (ZOFRAN) 4 MG tablet Take 4 mg by mouth every 6 (six) hours as needed for nausea or vomiting.  . pantoprazole (PROTONIX) 40 MG tablet Take 1  tablet (40 mg total) by mouth daily.  Marland Kitchen Phenylephrine-Cocoa Butter (PREPARATION H RE) Place rectally 4 (four) times daily as needed.  . polyethylene glycol (MIRALAX / GLYCOLAX) packet Take 17 g by mouth daily as needed.   . pravastatin (PRAVACHOL) 40 MG tablet Take 40 mg by mouth at bedtime.  . senna (SENOKOT) 8.6 MG TABS tablet Take 1 tablet by mouth at bedtime.   . torsemide (DEMADEX) 20 MG tablet Take 1 tablet (20 mg) by mouth once a day on Mondays/ Wednesdays/ & Fridays  . traMADol (ULTRAM) 50 MG tablet Take 1 tablet (50 mg total) by mouth every 8 (eight) hours as needed. Patient to carry to his room (Patient taking differently: Take 50 mg by mouth 3 (three) times daily. )  . triamcinolone cream (KENALOG) 0.1 % Apply 1 application topically 2 (two) times daily.  Marland Kitchen zinc sulfate 220 (50 Zn) MG capsule Take 220 mg by mouth daily.   No facility-administered encounter medications on file as of 05/17/2019.      Khadijah Mastrianni Jenetta Downer, NP

## 2019-05-17 NOTE — Telephone Encounter (Signed)
Made call to [facility] at [phone] attempting to obtain records. DON not available at this time. No vm available.

## 2019-05-17 NOTE — Telephone Encounter (Signed)
Fax sent to [facility] regarding no return calls regarding Eliquis. Letter and office notes attached for detail.   Successful receipt obtained.

## 2019-05-17 NOTE — Telephone Encounter (Signed)
See encounter from today for further detail.

## 2019-05-17 NOTE — Telephone Encounter (Signed)
Received [employer] from [place], pts daughter.  " [place], [place] Triage  Phone Number: [phone]  My name is Earley Brooke and my parents are Dejay and Cathlean Marseilles. We have found out that while our Dad was in the hospital for Covid that he was prescribed Eliquis. We don't know why and didn't know who ordered it for him. I looked on his [facility] Chart and saw that a Dr. Viona Gilmore. Tollie Eth, [facility]ito was the one who ordered it for him, but I could not find out the reason. Because of the restrictions caused by Covid, we were not able to find out everything that went on. Actually, it took 2 days to find out he could have a designated visitor, who would be my brother, he has POA, and then to get someone to call him. The problem with Eliquis is that it is costing our Dad $300 a month, money he doesn't have. If he needs to be on it, please find an alternative that is less expensive or if the reason no longer exist, please relay to [facility] that they need to stop ordering it for him.  Thank you for your time and attention to this matter."   She said after researching in my chart she found out that Dr. Tollie Eth, cardiologist in [place]. She does not know when it was ordered or why.   Freda Munro did report that patient is having a hard time paying for it. If Mr. Embery does not need it, she would like it discontinued.   I made her aware of my correspondence with them. She is appreciative of anything we can do to help figure this out.   I made call to [facility] on [address] to investigate further. They were unable to assist. I reached out to my manager Izora Gala who is aware of Dr. Wynonia Lawman not being in epic but who works in [employer] system. She suggested I reach out to [place] to obtain records.   Routing call at this time.

## 2019-05-18 NOTE — Telephone Encounter (Signed)
No records found in remote access for this patient for Dr. Wynonia Lawman.  I have requested from [facility] at [facility] which will forward my request to [place] to send the chart, and then medical records will forward notes to our office.

## 2019-05-24 NOTE — Telephone Encounter (Signed)
Lead RN not available. Left VM.

## 2019-05-24 NOTE — Telephone Encounter (Signed)
Director of nursing not available, left message with clerk.

## 2019-05-26 ENCOUNTER — Telehealth: Payer: Self-pay | Admitting: Nurse Practitioner

## 2019-05-26 NOTE — Telephone Encounter (Signed)
   Pt c/o medication issue:  1. Name of Medication: apixaban (ELIQUIS) 2.5 MG TABS tablet  2. How are you currently taking this medication (dosage and times per day)?   3. Are you having a reaction (difficulty breathing--STAT)?   4. What is your medication issue? Pt's wife called to follow up the message sent through [facility] on 05/23/19. She said pt is in care facility at [facility] and they're wondering if pt can be taken off eliquis or have a cheaper alternative, she said the nurse can call her or [place] at [phone]

## 2019-05-26 NOTE — Telephone Encounter (Signed)
Incoming call returned by [name] to discuss update.   I told her there is unfortunately not much update. We have been unable to obtain any records of when and why Eliquis was started.   [name] has an appt next week with [name], I told her if we have not been able to obtain any documentation by then, we may have to make an executive decision on how to move forward with Eliquis.   She verbalized understanding and cooperative with POC.   Received a second call from daughter [name]. Gave her same update. She reported that she would be glad to drive over to [place] and see if she can speak to DON about documents needed.

## 2019-05-26 NOTE — Telephone Encounter (Signed)
Attempted to call patient. LMTCB 05/26/2019

## 2019-06-01 ENCOUNTER — Ambulatory Visit (INDEPENDENT_AMBULATORY_CARE_PROVIDER_SITE_OTHER): Payer: Medicare Other | Admitting: Cardiovascular Disease

## 2019-06-01 ENCOUNTER — Encounter: Payer: Self-pay | Admitting: Cardiovascular Disease

## 2019-06-01 ENCOUNTER — Other Ambulatory Visit: Payer: Self-pay

## 2019-06-01 ENCOUNTER — Ambulatory Visit: Payer: Medicare Other | Admitting: Cardiovascular Disease

## 2019-06-01 VITALS — BP 98/56 | HR 75 | Ht 70.0 in | Wt 204.0 lb

## 2019-06-01 DIAGNOSIS — E785 Hyperlipidemia, unspecified: Secondary | ICD-10-CM

## 2019-06-01 DIAGNOSIS — I10 Essential (primary) hypertension: Secondary | ICD-10-CM | POA: Diagnosis not present

## 2019-06-01 DIAGNOSIS — I5032 Chronic diastolic (congestive) heart failure: Secondary | ICD-10-CM

## 2019-06-01 DIAGNOSIS — I2510 Atherosclerotic heart disease of native coronary artery without angina pectoris: Secondary | ICD-10-CM | POA: Diagnosis not present

## 2019-06-01 NOTE — Patient Instructions (Signed)
Medication Instructions:  Your physician has recommended you make the following change in your medication:   STOP Eliquis    *If you need a refill on your cardiac medications before your next appointment, please call your pharmacy*   Lab Work: None ordered If you have labs (blood work) drawn today and your tests are completely normal, you will receive your results only by: Marland Kitchen MyChart Message (if you have MyChart) OR . A paper copy in the mail If you have any lab test that is abnormal or we need to change your treatment, we will call you to review the results.   Testing/Procedures: None ordered   Follow-Up: At [facility], you and your health needs are our priority.  As part of our continuing mission to provide you with exceptional heart care, we have created designated Provider Care Teams.  These Care Teams include your primary Cardiologist (physician) and Advanced Practice Providers (APPs -  Physician Assistants and Nurse Practitioners) who all work together to provide you with the care you need, when you need it.  We recommend signing up for the patient portal called "MyChart".  Sign up information is provided on this After Visit Summary.  MyChart is used to connect with patients for Virtual Visits (Telemedicine).  Patients are able to view lab/test results, encounter notes, upcoming appointments, etc.  Non-urgent messages can be sent to your provider as well.   To learn more about what you can do with MyChart, go to [email or web address].    Your next appointment:   As planned with Ignacia Bayley, NP on 07/27/19 at 10:05 am  The format for your next appointment:   In Person  Provider:    You may see Kathlyn Sacramento, MD or one of the following Advanced Practice Providers on your designated Care Team:    Murray Hodgkins, NP  Christell Faith, PA-C  Marrianne Mood, PA-C    Other Instructions N/A

## 2019-06-01 NOTE — Progress Notes (Signed)
Cardiology Office Note   Date:  06/01/2019   ID:  [name], DOB 13-May-1923, MRN [id number]  PCP:  System, Pcp Not In  Cardiologist:   Kathlyn Sacramento, MD   No chief complaint on file.     History of Present Illness: [name] is a 86 y.o. male who presents for a follow-up visit regarding coronary artery disease.  He has known history of coronary artery disease status post LAD PCI with drug-eluting stent placement in May of 2015 at [facility], hypertension and hyperlipidemia. His cardiac cath showed subtotally occluded RCA with left-to-right collaterals, 30% ostial left circumflex and 50-70% proximal LAD stenosis with an IFR ratio of 0.82. The LAD was stented with a 3.5 x 24 mm Promus drug-eluting stent.  He has known history of chronic back pain due to vertebral fracture status post kyphoplasty.  He also has chronic diastolic heart failure.He lives at [place] he was hospitalized in February with COVID-19 pneumonia.  He has been doing reasonably well but continues to be frustrated about why he is on Eliquis.  We could not find an indication of why he is on it.  He denies chest pain.  His biggest issue seems to be low back pain.   Past Medical History:  Diagnosis Date  . (HFpEF) heart failure with preserved ejection fraction ([place])    a. 11/2017 Echo: EF 60-65%, no rwma, GR1 DD. Nl RV fxn.  . Anginal pain ([place])    c/o heaviness a few years ago.  stent placed and all resolved  . Anxiety   . BPH (benign prostatic hypertrophy)   . Carotid stenosis    a. 05/2015 Carotid U/S: mild nonobs atherosclerosis. No need for f/u.  Marland Kitchen Chronic back pain   . Chronic Left Pleural Effusion    a. 11/2017 Mod by CXR.  Marland Kitchen Coronary artery disease    a. 06/2013 Cath/PCI: LAD 50-70p (FFR 0.82-->PCI w/ 3.5x24 Promus DES), RCA subtotally occluded w/ L->R collats, LCX 30ost.  . Dyslipidemia   . GERD (gastroesophageal reflux disease)   . Hx of [name]   . Hyperlipidemia   . Hypertension   . Inguinal  hernia   . Lightheadedness    a. chronic, somewhat positional.  . 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 Major depression   . Sleep apnea    will not use cpap    Past Surgical History:  Procedure Laterality Date  . ANTERIOR APPROACH HEMI HIP ARTHROPLASTY Right 04/16/2016   Procedure: ANTERIOR APPROACH HEMI HIP ARTHROPLASTY;  Surgeon: Hessie Knows, MD;  Location: [facility] ORS;  Service: Orthopedics;  Laterality: Right;  . APPENDECTOMY     86 years old  . arm fracture Right 2008   rod in arm  . CARDIAC CATHETERIZATION  2002  . CARDIAC CATHETERIZATION  06/2013   stent placed  . COLONOSCOPY    . EYE SURGERY Bilateral 2008   cataract extraction  . FEMUR FRACTURE SURGERY Left    with rod  . HEMORROIDECTOMY     75 years ago  . HIP FRACTURE SURGERY    . JOINT REPLACEMENT Bilateral 03/2016   right partial knee replacement, bilateral THR  . KYPHOPLASTY N/A 12/30/2016   Procedure: [id number];  Surgeon: Hessie Knows, MD;  Location: [facility] ORS;  Service: Orthopedics;  Laterality: N/A;     Current Outpatient Medications  Medication Sig Dispense Refill  . acetaminophen (TYLENOL) 500 MG  tablet Take 1,000 mg by mouth 3 (three) times daily.    Marland Kitchen acidophilus (RISAQUAD) CAPS capsule Take 1 capsule by mouth daily.    Marland Kitchen albuterol (VENTOLIN HFA) 108 (90 Base) MCG/ACT inhaler Inhale 2 puffs into the lungs every 6 (six) hours.    Marland Kitchen alum & mag hydroxide-simeth (MINTOX) [id number] MG/5ML suspension Take 30 mLs by mouth every 6 (six) hours as needed for indigestion or heartburn.    Marland Kitchen antiseptic oral rinse (BIOTENE) LIQD 15 mLs by Mouth Rinse route 2 (two) times daily as needed for dry mouth.    Marland Kitchen ascorbic acid (VITAMIN C) 500 MG tablet Take 1 tablet (500 mg total) by mouth daily.    . Cholecalciferol (VITAMIN D3) 5000 units CAPS Take 5,000 Units by mouth daily.     . clonazePAM (KLONOPIN) 1 MG tablet Take 1 mg by mouth in the morning, at  noon, and at bedtime.     . diclofenac sodium (VOLTAREN) 1 % GEL Apply 2 g topically 2 (two) times daily as needed (lower back pain). (use when lidocaine patch is off)    . docusate sodium (COLACE) 100 MG capsule Take 100 mg by mouth 2 (two) times daily.    Marland Kitchen doxazosin (CARDURA) 1 MG tablet Take 1 tablet (1 mg total) by mouth daily. 90 tablet 0  . escitalopram (LEXAPRO) 20 MG tablet Take 20 mg by mouth daily.     Marland Kitchen gabapentin (NEURONTIN) 300 MG capsule Take 300 mg by mouth at bedtime.     Marland Kitchen guaiFENesin (ROBITUSSIN) 100 MG/5ML liquid Take 200 mg by mouth every 6 (six) hours as needed for cough.     . hydrocortisone 2.5 % cream Apply 1 application topically every other day. (apply to scalp and alternate with ketoconazole)    . isosorbide mononitrate (IMDUR) 30 MG 24 hr tablet Take 1 tablet (30 mg total) by mouth daily. 90 tablet 0  . LACTOBACILLUS PO Take by mouth daily.    . Lidocaine (ASPERCREME LIDOCAINE) 4 % PTCH Apply 1 patch topically 2 (two) times daily.    Marland Kitchen lidocaine (XYLOCAINE) 2 % solution Use as directed 5 mLs in the mouth or throat 3 (three) times daily.  0  . loperamide (IMODIUM) 2 MG capsule Take 2 mg by mouth as needed for diarrhea or loose stools. (max 8 doses in 24 hours)    . Lutein 10 MG TABS Take 10 mg by mouth 2 (two) times daily.    . magic mouthwash SOLN Take 10 mLs by mouth 3 (three) times daily as needed for mouth pain.  0  . metoprolol tartrate (LOPRESSOR) 25 MG tablet Take 12.5 mg by mouth 2 (two) times daily.     . Multiple Vitamins-Iron (MULTIVITAMINS WITH IRON) TABS tablet Take 1 tablet by mouth daily.    Marland Kitchen neomycin-polymyxin b-dexamethasone (MAXITROL) [id number] OINT 1 application as needed.    . nitroGLYCERIN (NITROSTAT) 0.4 MG SL tablet Place 0.4 mg under the tongue every 5 (five) minutes as needed for chest pain.    Marland Kitchen ondansetron (ZOFRAN) 4 MG tablet Take 4 mg by mouth every 6 (six) hours as needed for nausea or vomiting.    . pantoprazole (PROTONIX) 40 MG  tablet Take 1 tablet (40 mg total) by mouth daily. 90 tablet 0  . Phenylephrine-Cocoa Butter (PREPARATION H RE) Place rectally 4 (four) times daily as needed.    . polyethylene glycol (MIRALAX / GLYCOLAX) packet Take 17 g by mouth daily as needed.     Marland Kitchen  pravastatin (PRAVACHOL) 40 MG tablet Take 40 mg by mouth at bedtime.    . senna (SENOKOT) 8.6 MG TABS tablet Take 1 tablet by mouth at bedtime.     . torsemide (DEMADEX) 20 MG tablet Take 1 tablet (20 mg) by mouth once a day on Mondays/ Wednesdays/ & Fridays 30 tablet 3  . traMADol (ULTRAM) 50 MG tablet Take 1 tablet (50 mg total) by mouth every 8 (eight) hours as needed. Patient to carry to his room 12 tablet 0  . triamcinolone cream (KENALOG) 0.1 % Apply 1 application topically 2 (two) times daily.    Marland Kitchen zinc sulfate 220 (50 Zn) MG capsule Take 220 mg by mouth daily.     No current facility-administered medications for this visit.    Allergies:   Cyclobenzaprine, Other, Sulfamethoxazole, Baclofen, Nsaids, Sulfamethoxazole-trimethoprim, and Tolmetin    Social History:  The patient  reports that he quit smoking about 65 years ago. His smoking use included cigarettes. He has a 14.00 pack-year smoking history. He has never used smokeless tobacco. He reports that he does not drink alcohol or use drugs.   Family History:  The patient's family history includes Dementia in his father; Diabetes in his father; Heart disease in his mother; Stroke in his brother, brother, brother, mother, sister, sister, and sister.    ROS:  Please see the history of present illness.   Otherwise, review of systems are positive for none.   All other systems are reviewed and negative.    PHYSICAL EXAM: VS:  BP (!) 98/56   Pulse 75   Ht 5\' 10"  (1.778 m)   Wt 204 lb (92.5 kg)   SpO2 96%   BMI 29.27 kg/m  , BMI Body mass index is 29.27 kg/m. GEN: Well nourished, well developed, in no acute distress  HEENT: normal  Neck: no JVD, carotid bruits, or masses Cardiac:  RRR; no murmurs, rubs, or gallops,no edema  Respiratory:  clear to auscultation bilaterally, normal work of breathing GI: soft, nontender, nondistended, + BS MS: no deformity or atrophy  Skin: warm and dry, no rash Neuro:  Strength and sensation are intact Psych: euthymic mood, full affect   EKG:  EKG is ordered today. The ekg ordered today demonstrates normal sinus rhythm with first-degree AV block.   Recent Labs: 03/27/2019: B Natriuretic Peptide 160.0; TSH 0.169 03/31/2019: ALT 25; BUN 48; Creatinine, Ser 1.29; Hemoglobin 10.9; Magnesium 2.0; Platelets 152; Potassium 4.0; Sodium 139    Lipid Panel    Component Value Date/Time   CHOL 68 02/19/2016 1030   CHOL 76 (L) 07/23/2014 1006   CHOL 60 12/14/2013 0505   TRIG 102 03/27/2019 0836   TRIG 80 12/14/2013 0505   HDL 20 (L) 02/19/2016 1030   HDL 24 (L) 07/23/2014 1006   HDL 24 (L) 12/14/2013 0505   CHOLHDL 3.4 02/19/2016 1030   VLDL 25 02/19/2016 1030   VLDL 16 12/14/2013 0505   LDLCALC 23 02/19/2016 1030   LDLCALC 36 07/23/2014 1006   LDLCALC 20 12/14/2013 0505      Wt Readings from Last 3 Encounters:  06/01/19 204 lb (92.5 kg)  04/20/19 201 lb (91.2 kg)  04/19/19 201 lb (91.2 kg)       ASSESSMENT AND PLAN:  1.coronary artery disease involving native coronary arteries without angina: He is doing reasonably well from a cardiac standpoint.  Continue medical therapy.     2. Essential hypertension: Blood pressure is well controlled on current medications.    3.  Hyperlipidemia: Continue treatment with pravastatin.   4.  Chronic diastolic heart failure: He appears to be euvolemic on current dose of torsemide.  5.  Long-term anticoagulation with Eliquis.  We could not find any indication of why he should be on anticoagulation long-term.  He has no history of atrial fibrillation and no history of DVT or pulmonary embolism.  It is possible that he was placed on a small prophylactic dose post COVID-19 infection.  But that  has resolved I am going to stop Eliquis.   Disposition:   Follow-up in 2 to 3 months.  Signed,  Kathlyn Sacramento, MD  06/01/2019 4:58 PM    [place]

## 2019-06-14 ENCOUNTER — Encounter: Payer: Medicare Other | Admitting: Adult Health Nurse Practitioner

## 2019-06-14 ENCOUNTER — Ambulatory Visit (INDEPENDENT_AMBULATORY_CARE_PROVIDER_SITE_OTHER): Payer: Medicare Other | Admitting: Dermatology

## 2019-06-14 ENCOUNTER — Other Ambulatory Visit: Payer: Self-pay

## 2019-06-14 DIAGNOSIS — C4442 Squamous cell carcinoma of skin of scalp and neck: Secondary | ICD-10-CM | POA: Diagnosis not present

## 2019-06-14 DIAGNOSIS — L578 Other skin changes due to chronic exposure to nonionizing radiation: Secondary | ICD-10-CM | POA: Diagnosis not present

## 2019-06-14 DIAGNOSIS — D485 Neoplasm of uncertain behavior of skin: Secondary | ICD-10-CM

## 2019-06-14 NOTE — Progress Notes (Signed)
   Follow-Up Visit   Subjective  [name] is a 86 y.o. male who presents for the following: recheck ISK on the post scalp (lesion still present - patient and wife are very concerned about it). Patient c/o pain and tenderness at site - didn't improve with Ln2 treatment.  The following portions of the chart were reviewed this encounter and updated as appropriate:  Tobacco  Allergies  Meds  Problems  Med Hx  Surg Hx  Fam Hx     Review of Systems:  No other skin or systemic complaints except as noted in HPI or Assessment and Plan.  Objective  Well appearing patient in no apparent distress; mood and affect are within normal limits.  A focused examination was performed including the scalp. Relevant physical exam findings are noted in the Assessment and Plan.  Objective  Posterior scalp/crown area: 1.9 cm crusted papule    Assessment & Plan    Actinic Damage - diffuse scaly erythematous macules with underlying dyspigmentation - Recommend daily broad spectrum sunscreen SPF 30+ to sun-exposed areas, reapply every 2 hours as needed.  - Call for new or changing lesions.   Neoplasm of uncertain behavior of skin Posterior scalp/crown area  Epidermal / dermal shaving  Lesion length (cm):  1.9 Lesion width (cm):  1.9 Margin per side (cm):  0.2 Total excision diameter (cm):  2.3 Informed consent: discussed and consent obtained   Timeout: patient name, date of birth, surgical site, and procedure verified   Procedure prep:  Patient was prepped and draped in usual sterile fashion Prep type:  Isopropyl alcohol Anesthesia: the lesion was anesthetized in a standard fashion   Anesthetic:  1% lidocaine w/ epinephrine 1-100,000 buffered w/ 8.4% NaHCO3 Instrument used: flexible razor blade   Hemostasis achieved with: pressure, aluminum chloride and electrodesiccation   Outcome: patient tolerated procedure well   Post-procedure details: sterile dressing applied and wound care  instructions given   Dressing type: bandage and petrolatum    Destruction of lesion Complexity: extensive   Destruction method: electrodesiccation and curettage   Informed consent: discussed and consent obtained   Timeout:  patient name, date of birth, surgical site, and procedure verified Procedure prep:  Patient was prepped and draped in usual sterile fashion Prep type:  Isopropyl alcohol Anesthesia: the lesion was anesthetized in a standard fashion   Anesthetic:  1% lidocaine w/ epinephrine 1-100,000 buffered w/ 8.4% NaHCO3 Curettage performed in three different directions: Yes   Electrodesiccation performed over the curetted area: Yes   Lesion length (cm):  1.9 Lesion width (cm):  1.9 Margin per side (cm):  0.2 Final wound size (cm):  2.3 Hemostasis achieved with:  pressure, aluminum chloride and electrodesiccation Outcome: patient tolerated procedure well with no complications   Post-procedure details: sterile dressing applied and wound care instructions given   Dressing type: bandage and petrolatum    Specimen 1 - Surgical pathology Differential Diagnosis: R/O SCC vs BCC ED&C today  Check Margins: No 1.9 cm crusted papule  Return in about 3 months (around 09/13/2019).  Luther Redo, CMA, am acting as scribe for Sarina Ser, MD .  Documentation: I have reviewed the above documentation for accuracy and completeness, and I agree with the above.  Sarina Ser, MD

## 2019-06-14 NOTE — Patient Instructions (Signed)
Shave Excision Benign Lesion Wound Care Instructions  . Leave the original bandage on for 24 hours if possible.  If the bandage becomes soaked or soiled before that time, it is OK to remove it and examine the wound.  A small amount of post-operative bleeding is normal.  If excessive bleeding occurs, remove the bandage, place gauze over the site and apply continuous pressure (no peeking) over the area for 20-30 minutes.  If this does not stop the bleeding, try again for 40 minutes.  If this does not work, please call our clinic as soon as possible (even if after-hours).    . Twice a day, cleanse the wound with soap and water.  If a thick crust develops you may use a Q-tip dipped into dilute hydrogen peroxide (mix 1:1 with water) to dissolve it.  Hydrogen peroxide can slow the healing process, so use it only as needed.  After washing, apply Vaseline jelly or Polysporin ointment.  For best healing, the wound should be covered with a layer of ointment at all times.  This may mean re-applying the ointment several times a day.  For open wounds, continue until it has healed.    . If you have any swelling, keep the area elevated.  . Some redness, tenderness and white or yellow material in the wound is normal healing.  If the area becomes very sore and red, or develops a thick yellow-green material (pus), it may be infected; please notify us.    . Wound healing continues for up to one year following surgery.  It is not unusual to experience pain in the scar from time to time during the interval.  If the pain becomes severe or the scar thickens, you should notify the office.  A slight amount of redness in a scar is expected for the first six months.  After six months, the redness subsides and the scar will soften and fade.  The color difference becomes less noticeable with time.  If there are any problems, return for a post-op surgery check at your earliest convenience.  . Please call our office for any questions  or concerns.   Electrodesiccation and Curettage ("Scrape and Burn") Wound Care Instructions  1. Leave the original bandage on for 24 hours if possible.  If the bandage becomes soaked or soiled before that time, it is OK to remove it and examine the wound.  A small amount of post-operative bleeding is normal.  If excessive bleeding occurs, remove the bandage, place gauze over the site and apply continuous pressure (no peeking) over the area for 30 minutes. If this does not work, please call our clinic as soon as possible or page your doctor if it is after hours.   2. Once a day, cleanse the wound with soap and water. It is fine to shower. If a thick crust develops you may use a Q-tip dipped into dilute hydrogen peroxide (mix 1:1 with water) to dissolve it.  Hydrogen peroxide can slow the healing process, so use it only as needed.    3. After washing, apply petroleum jelly (Vaseline) or an antibiotic ointment if your doctor prescribed one for you, followed by a bandage.    4. For best healing, the wound should be covered with a layer of ointment at all times. If you are not able to keep the area covered with a bandage to hold the ointment in place, this may mean re-applying the ointment several times a day.  Continue this wound care   until the wound has healed and is no longer open. It may take several weeks for the wound to heal and close.  Itching and mild discomfort is normal during the healing process.  If you have any discomfort, you can take Tylenol (acetaminophen) or ibuprofen as directed on the bottle. (Please do not take these if you have an allergy to them or cannot take them for another reason).  Some redness, tenderness and white or yellow material in the wound is normal healing.  If the area becomes very sore and red, or develops a thick yellow-green material (pus), it may be infected; please notify us.    Wound healing continues for up to one year following surgery. It is not unusual to  experience pain in the scar from time to time during the interval.  If the pain becomes severe or the scar thickens, you should notify the office.    A slight amount of redness in a scar is expected for the first six months.  After six months, the redness will fade and the scar will soften and fade.  The color difference becomes less noticeable with time.  If there are any problems, return for a post-op surgery check at your earliest convenience.  To improve the appearance of the scar, you can use silicone scar gel, cream, or sheets (such as Mederma or Serica) every night for up to one year. These are available over the counter (without a prescription).  Please call our office at [phone] for any questions or concerns.  

## 2019-06-16 ENCOUNTER — Encounter: Payer: Self-pay | Admitting: Dermatology

## 2019-06-19 ENCOUNTER — Telehealth: Payer: Self-pay

## 2019-06-19 NOTE — Telephone Encounter (Signed)
Left message for patient to call office for results. °

## 2019-06-19 NOTE — Telephone Encounter (Signed)
-----   Message from Ralene Bathe, MD sent at 06/16/2019 12:26 PM EDT ----- Skin , posterior scalp/crown area [place] - SCC Already txd w [place] next visit

## 2019-06-20 NOTE — Telephone Encounter (Signed)
Patient and spouse advised of biopsy results. Will keep 3 month follow up.

## 2019-06-28 ENCOUNTER — Other Ambulatory Visit: Payer: Self-pay

## 2019-06-28 ENCOUNTER — Non-Acute Institutional Stay: Payer: Medicare Other | Admitting: Adult Health Nurse Practitioner

## 2019-06-28 DIAGNOSIS — I5033 Acute on chronic diastolic (congestive) heart failure: Secondary | ICD-10-CM

## 2019-06-28 DIAGNOSIS — Z515 Encounter for palliative care: Secondary | ICD-10-CM

## 2019-06-28 NOTE — Progress Notes (Signed)
[facility] Palliative Care Consult Note Telephone: [phone]  Fax: [phone]  PATIENT NAME: [name] DOB: [birth year] MRN: [id number]  PRIMARY CARE PROVIDER:   [facility]  REFERRING PROVIDER:  Thea Gist NP  RESPONSIBLE PARTY:   Shawnie Dapper, son/POA [phone] Damontae Seber, wife Aurora, daughter [phone]    RECOMMENDATIONS and PLAN:  1.  Advanced care planning. Patient is a DNR  2.  CHF/[name]/HTN.  Patient is doing well.  Had recent cardiology appointment and eliquis was stopped due to no indication for it.  Believed may have been started while he was in hospital with COVID.  Denies increased SOB or cough, chest pain, palpitations, edema, orthopnea, PND.  Weight is stable around 204 pounds.  Continue follow up and recommendations by cardiology  3.  Scalp lesion.  Patient was seen by dermatology on 06/14/19 for excision of posterior scalp lesion.  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  Area of excision is healing well and there is scab over the excision.  No erythema, drainage or tenderness to touch noted.  The excision removed all the cancer and he has follow up in 3 months.  Continue follow up and recommendations by dermatology  4.  Allergies.  Patient complains of post nasal drainage.  Denies fever, sinus pain or pressure, nasal congestion.  Offered to order allergy medicine such as claritin and he refused stating that he hears on TV that they cause cancer.  Wife thinks he is confusing it with the reports of Zantac causing cancer.  Per is wishes and concerns will not prescribe anything for his allergies.    Overall patient had no new medical concerns.  Denies fever, increased SOB, N/V/D, constipation.  No reported falls, infections, or hospital visits since last visit.  Patient and wife had lots of concerns about facility related matters.  Spoke with Development worker, international aid to let her know of their  concerns and their desire to reach out to someone with [facility].  Palliative will continue to monitor for symptom management/decline and make recommendations as needed.  Will follow up in 4-6weeks.  I spent 60 minutes providing this consultation,  from 9:30 to 10:30 including time with patient/family, chart review, provider coordination, and documentation. More than 50% of the time in this consultation was spent coordinating communication.   HISTORY OF PRESENT ILLNESS:  [name] is a 86 y.o. year old male with multiple medical problems including HF with preserved EF, BPH, carotid stenosis, CKD, [name], chronic back pain, anxiety, h/o [name].patient was treated at hospital for [place] 2/8-2/01/2020. Palliative Care was asked to help address goals of care.   CODE STATUS: DNR  PPS: 50% HOSPICE ELIGIBILITY/DIAGNOSIS: TBD  PHYSICAL EXAM:  HR  66  O2 97% on RA General: NAD, frail appearing Cardiovascular: regular rate and rhythm Pulmonary: lung sounds clear; normal respiratory effort Abdomen: soft, nontender, + bowel sounds GU: no suprapubic tenderness Extremities: no edema, no joint deformities Skin: excision site to posterior scalp has scabbing with no erythema, drainage, or tenderness to touch noted Neurological: Weakness but otherwise nonfocal; wife voices he has forgetfulness   PAST MEDICAL HISTORY:  Past Medical History:  Diagnosis Date  . (HFpEF) heart failure with preserved ejection fraction ([place])    a. 11/2017 Echo: EF 60-65%, no rwma, GR1 DD. Nl RV fxn.  . Anginal pain ([place])    c/o heaviness a few years ago.  stent placed and all resolved  .  Anxiety   . BPH (benign prostatic hypertrophy)   . Carotid stenosis    a. 05/2015 Carotid U/S: mild nonobs atherosclerosis. No need for f/u.  Marland Kitchen Chronic back pain   . Chronic Left Pleural Effusion    a. 11/2017 Mod by CXR.  Marland Kitchen Coronary artery disease    a. 06/2013 Cath/PCI: LAD 50-70p (FFR 0.82-->PCI w/ 3.5x24 Promus DES),  RCA subtotally occluded w/ L->R collats, LCX 30ost.  . Dyslipidemia   . GERD (gastroesophageal reflux disease)   . Hx of [name]   . Hyperlipidemia   . Hypertension   . Inguinal hernia   . Lightheadedness    a. chronic, somewhat positional.  . 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 Major depression   . Sleep apnea    will not use cpap    SOCIAL HX:  Social History   Tobacco Use  . Smoking status: Former Smoker    Packs/day: 2.00    Years: 7.00    Pack years: 14.00    Types: Cigarettes    Quit date: 09/17/1953    Years since quitting: 65.8  . Smokeless tobacco: Never Used  Substance Use Topics  . Alcohol use: No    Alcohol/week: 0.0 standard drinks    ALLERGIES:  Allergies  Allergen Reactions  . Cyclobenzaprine Other (See Comments)    Hallucination. Mental instability.  DO NOT GIVE ANY MUSCLE RELAXANTS  . Other Other (See Comments)  . Sulfamethoxazole Other (See Comments)  . Baclofen Other (See Comments)    Confusion and weakness  . Nsaids     Avoids due to liver.  . Sulfamethoxazole-Trimethoprim Nausea Only and Other (See Comments)    Patient unaware of this as an allergy  . Tolmetin Other (See Comments)    Avoids due to liver.it is an nsaid     PERTINENT MEDICATIONS:  Outpatient Encounter Medications as of 06/28/2019  Medication Sig  . acetaminophen (TYLENOL) 500 MG tablet Take 1,000 mg by mouth 3 (three) times daily.  Marland Kitchen acidophilus (RISAQUAD) CAPS capsule Take 1 capsule by mouth daily.  Marland Kitchen albuterol (VENTOLIN HFA) 108 (90 Base) MCG/ACT inhaler Inhale 2 puffs into the lungs every 6 (six) hours.  Marland Kitchen alum & mag hydroxide-simeth (MINTOX) [id number] MG/5ML suspension Take 30 mLs by mouth every 6 (six) hours as needed for indigestion or heartburn.  Marland Kitchen antiseptic oral rinse (BIOTENE) LIQD 15 mLs by Mouth Rinse route 2 (two) times daily as needed for dry mouth.  Marland Kitchen ascorbic acid (VITAMIN C) 500 MG tablet  Take 1 tablet (500 mg total) by mouth daily.  . Cholecalciferol (VITAMIN D3) 5000 units CAPS Take 5,000 Units by mouth daily.   . clonazePAM (KLONOPIN) 1 MG tablet Take 1 mg by mouth in the morning, at noon, and at bedtime.   . diclofenac sodium (VOLTAREN) 1 % GEL Apply 2 g topically 2 (two) times daily as needed (lower back pain). (use when lidocaine patch is off)  . docusate sodium (COLACE) 100 MG capsule Take 100 mg by mouth 2 (two) times daily.  Marland Kitchen doxazosin (CARDURA) 1 MG tablet Take 1 tablet (1 mg total) by mouth daily.  Marland Kitchen escitalopram (LEXAPRO) 20 MG tablet Take 20 mg by mouth daily.   Marland Kitchen gabapentin (NEURONTIN) 300 MG capsule Take 300 mg by mouth at bedtime.   Marland Kitchen guaiFENesin (ROBITUSSIN) 100 MG/5ML liquid Take 200 mg by mouth every 6 (six) hours as needed for cough.   Marland Kitchen  hydrocortisone 2.5 % cream Apply 1 application topically every other day. (apply to scalp and alternate with ketoconazole)  . isosorbide mononitrate (IMDUR) 30 MG 24 hr tablet Take 1 tablet (30 mg total) by mouth daily.  Marland Kitchen LACTOBACILLUS PO Take by mouth daily.  . Lidocaine (ASPERCREME LIDOCAINE) 4 % PTCH Apply 1 patch topically 2 (two) times daily.  Marland Kitchen lidocaine (XYLOCAINE) 2 % solution Use as directed 5 mLs in the mouth or throat 3 (three) times daily.  Marland Kitchen loperamide (IMODIUM) 2 MG capsule Take 2 mg by mouth as needed for diarrhea or loose stools. (max 8 doses in 24 hours)  . Lutein 10 MG TABS Take 10 mg by mouth 2 (two) times daily.  . magic mouthwash SOLN Take 10 mLs by mouth 3 (three) times daily as needed for mouth pain.  . metoprolol tartrate (LOPRESSOR) 25 MG tablet Take 12.5 mg by mouth 2 (two) times daily.   . Multiple Vitamins-Iron (MULTIVITAMINS WITH IRON) TABS tablet Take 1 tablet by mouth daily.  Marland Kitchen neomycin-polymyxin b-dexamethasone (MAXITROL) [id number] OINT 1 application as needed.  . nitroGLYCERIN (NITROSTAT) 0.4 MG SL tablet Place 0.4 mg under the tongue every 5 (five) minutes as needed for chest pain.  Marland Kitchen  ondansetron (ZOFRAN) 4 MG tablet Take 4 mg by mouth every 6 (six) hours as needed for nausea or vomiting.  . pantoprazole (PROTONIX) 40 MG tablet Take 1 tablet (40 mg total) by mouth daily.  Marland Kitchen Phenylephrine-Cocoa Butter (PREPARATION H RE) Place rectally 4 (four) times daily as needed.  . polyethylene glycol (MIRALAX / GLYCOLAX) packet Take 17 g by mouth daily as needed.   . pravastatin (PRAVACHOL) 40 MG tablet Take 40 mg by mouth at bedtime.  . senna (SENOKOT) 8.6 MG TABS tablet Take 1 tablet by mouth at bedtime.   . torsemide (DEMADEX) 20 MG tablet Take 1 tablet (20 mg) by mouth once a day on Mondays/ Wednesdays/ & Fridays  . traMADol (ULTRAM) 50 MG tablet Take 1 tablet (50 mg total) by mouth every 8 (eight) hours as needed. Patient to carry to his room  . triamcinolone cream (KENALOG) 0.1 % Apply 1 application topically 2 (two) times daily.  Marland Kitchen zinc sulfate 220 (50 Zn) MG capsule Take 220 mg by mouth daily.   No facility-administered encounter medications on file as of 06/28/2019.     Adelfo Diebel Jenetta Downer, NP

## 2019-07-23 ENCOUNTER — Emergency Department: Payer: Medicare Other

## 2019-07-23 ENCOUNTER — Other Ambulatory Visit: Payer: Self-pay

## 2019-07-23 ENCOUNTER — Inpatient Hospital Stay
Admission: EM | Admit: 2019-07-23 | Discharge: 2019-07-26 | DRG: 871 | Disposition: A | Discharge status: Skilled Nursing Facility | Payer: Medicare Other | Source: Skilled Nursing Facility | Attending: Internal Medicine | Admitting: Internal Medicine

## 2019-07-23 DIAGNOSIS — Y92009 Unspecified place in unspecified non-institutional (private) residence as the place of occurrence of the external cause: Secondary | ICD-10-CM | POA: Diagnosis not present

## 2019-07-23 DIAGNOSIS — Z96643 Presence of artificial hip joint, bilateral: Secondary | ICD-10-CM | POA: Diagnosis present

## 2019-07-23 DIAGNOSIS — Z7189 Other specified counseling: Secondary | ICD-10-CM | POA: Diagnosis not present

## 2019-07-23 DIAGNOSIS — E785 Hyperlipidemia, unspecified: Secondary | ICD-10-CM | POA: Diagnosis present

## 2019-07-23 DIAGNOSIS — F329 Major depressive disorder, single episode, unspecified: Secondary | ICD-10-CM | POA: Diagnosis present

## 2019-07-23 DIAGNOSIS — I2510 Atherosclerotic heart disease of native coronary artery without angina pectoris: Secondary | ICD-10-CM | POA: Diagnosis present

## 2019-07-23 DIAGNOSIS — R413 Other amnesia: Secondary | ICD-10-CM | POA: Diagnosis present

## 2019-07-23 DIAGNOSIS — N400 Enlarged prostate without lower urinary tract symptoms: Secondary | ICD-10-CM | POA: Diagnosis present

## 2019-07-23 DIAGNOSIS — Z888 Allergy status to other drugs, medicaments and biological substances status: Secondary | ICD-10-CM

## 2019-07-23 DIAGNOSIS — Z20822 Contact with and (suspected) exposure to covid-19: Secondary | ICD-10-CM | POA: Diagnosis present

## 2019-07-23 DIAGNOSIS — A419 Sepsis, unspecified organism: Principal | ICD-10-CM | POA: Diagnosis present

## 2019-07-23 DIAGNOSIS — D696 Thrombocytopenia, unspecified: Secondary | ICD-10-CM | POA: Diagnosis present

## 2019-07-23 DIAGNOSIS — G9340 Encephalopathy, unspecified: Secondary | ICD-10-CM | POA: Diagnosis not present

## 2019-07-23 DIAGNOSIS — Z515 Encounter for palliative care: Secondary | ICD-10-CM | POA: Diagnosis not present

## 2019-07-23 DIAGNOSIS — G4730 Sleep apnea, unspecified: Secondary | ICD-10-CM | POA: Diagnosis present

## 2019-07-23 DIAGNOSIS — G8929 Other chronic pain: Secondary | ICD-10-CM | POA: Diagnosis present

## 2019-07-23 DIAGNOSIS — Z66 Do not resuscitate: Secondary | ICD-10-CM | POA: Diagnosis present

## 2019-07-23 DIAGNOSIS — I130 Hypertensive heart and chronic kidney disease with heart failure and stage 1 through stage 4 chronic kidney disease, or unspecified chronic kidney disease: Secondary | ICD-10-CM | POA: Diagnosis present

## 2019-07-23 DIAGNOSIS — Z882 Allergy status to sulfonamides status: Secondary | ICD-10-CM

## 2019-07-23 DIAGNOSIS — M545 Low back pain: Secondary | ICD-10-CM | POA: Diagnosis present

## 2019-07-23 DIAGNOSIS — I5031 Acute diastolic (congestive) heart failure: Secondary | ICD-10-CM | POA: Diagnosis not present

## 2019-07-23 DIAGNOSIS — N1831 Chronic kidney disease, stage 3a: Secondary | ICD-10-CM | POA: Diagnosis present

## 2019-07-23 DIAGNOSIS — Z823 Family history of stroke: Secondary | ICD-10-CM

## 2019-07-23 DIAGNOSIS — M47812 Spondylosis without myelopathy or radiculopathy, cervical region: Secondary | ICD-10-CM | POA: Diagnosis present

## 2019-07-23 DIAGNOSIS — R6520 Severe sepsis without septic shock: Secondary | ICD-10-CM | POA: Diagnosis not present

## 2019-07-23 DIAGNOSIS — F419 Anxiety disorder, unspecified: Secondary | ICD-10-CM | POA: Diagnosis present

## 2019-07-23 DIAGNOSIS — N390 Urinary tract infection, site not specified: Secondary | ICD-10-CM

## 2019-07-23 DIAGNOSIS — Z87440 Personal history of urinary (tract) infections: Secondary | ICD-10-CM

## 2019-07-23 DIAGNOSIS — N179 Acute kidney failure, unspecified: Secondary | ICD-10-CM | POA: Diagnosis present

## 2019-07-23 DIAGNOSIS — R4182 Altered mental status, unspecified: Secondary | ICD-10-CM

## 2019-07-23 DIAGNOSIS — D649 Anemia, unspecified: Secondary | ICD-10-CM | POA: Diagnosis present

## 2019-07-23 DIAGNOSIS — I509 Heart failure, unspecified: Secondary | ICD-10-CM

## 2019-07-23 DIAGNOSIS — G9341 Metabolic encephalopathy: Secondary | ICD-10-CM | POA: Diagnosis present

## 2019-07-23 DIAGNOSIS — W19XXXA Unspecified fall, initial encounter: Secondary | ICD-10-CM | POA: Diagnosis not present

## 2019-07-23 DIAGNOSIS — K219 Gastro-esophageal reflux disease without esophagitis: Secondary | ICD-10-CM | POA: Diagnosis present

## 2019-07-23 DIAGNOSIS — J189 Pneumonia, unspecified organism: Secondary | ICD-10-CM | POA: Diagnosis present

## 2019-07-23 DIAGNOSIS — G629 Polyneuropathy, unspecified: Secondary | ICD-10-CM | POA: Diagnosis present

## 2019-07-23 DIAGNOSIS — I5033 Acute on chronic diastolic (congestive) heart failure: Secondary | ICD-10-CM | POA: Diagnosis present

## 2019-07-23 DIAGNOSIS — M4802 Spinal stenosis, cervical region: Secondary | ICD-10-CM | POA: Diagnosis present

## 2019-07-23 DIAGNOSIS — R0602 Shortness of breath: Secondary | ICD-10-CM | POA: Diagnosis present

## 2019-07-23 DIAGNOSIS — H9190 Unspecified hearing loss, unspecified ear: Secondary | ICD-10-CM | POA: Diagnosis present

## 2019-07-23 DIAGNOSIS — Z8616 Personal history of COVID-19: Secondary | ICD-10-CM

## 2019-07-23 DIAGNOSIS — Z87891 Personal history of nicotine dependence: Secondary | ICD-10-CM

## 2019-07-23 DIAGNOSIS — I10 Essential (primary) hypertension: Secondary | ICD-10-CM | POA: Diagnosis not present

## 2019-07-23 DIAGNOSIS — Z9049 Acquired absence of other specified parts of digestive tract: Secondary | ICD-10-CM | POA: Diagnosis not present

## 2019-07-23 DIAGNOSIS — N183 Chronic kidney disease, stage 3 unspecified: Secondary | ICD-10-CM | POA: Diagnosis present

## 2019-07-23 DIAGNOSIS — Z8572 Personal history of non-Hodgkin lymphomas: Secondary | ICD-10-CM

## 2019-07-23 DIAGNOSIS — Z8249 Family history of ischemic heart disease and other diseases of the circulatory system: Secondary | ICD-10-CM

## 2019-07-23 DIAGNOSIS — Z79899 Other long term (current) drug therapy: Secondary | ICD-10-CM

## 2019-07-23 HISTORY — DX: Chronic kidney disease, stage 3 unspecified: N18.30

## 2019-07-23 LAB — CBC WITH DIFFERENTIAL/PLATELET
Abs Immature Granulocytes: 0.2 10*3/uL — ABNORMAL HIGH (ref 0.00–0.07)
Basophils Absolute: 0 10*3/uL (ref 0.0–0.1)
Basophils Relative: 0 %
Eosinophils Absolute: 0 10*3/uL (ref 0.0–0.5)
Eosinophils Relative: 0 %
HCT: 35.1 % — ABNORMAL LOW (ref 39.0–52.0)
Hemoglobin: 11.3 g/dL — ABNORMAL LOW (ref 13.0–17.0)
Immature Granulocytes: 1 %
Lymphocytes Relative: 6 %
Lymphs Abs: 1.1 10*3/uL (ref 0.7–4.0)
MCH: 28.9 pg (ref 26.0–34.0)
MCHC: 32.2 g/dL (ref 30.0–36.0)
MCV: 89.8 fL (ref 80.0–100.0)
Monocytes Absolute: 1.1 10*3/uL — ABNORMAL HIGH (ref 0.1–1.0)
Monocytes Relative: 6 %
Neutro Abs: 17 10*3/uL — ABNORMAL HIGH (ref 1.7–7.7)
Neutrophils Relative %: 87 %
Platelets: 161 10*3/uL (ref 150–400)
RBC: 3.91 MIL/uL — ABNORMAL LOW (ref 4.22–5.81)
RDW: 14.3 % (ref 11.5–15.5)
WBC: 19.5 10*3/uL — ABNORMAL HIGH (ref 4.0–10.5)
nRBC: 0 % (ref 0.0–0.2)

## 2019-07-23 LAB — COMPREHENSIVE METABOLIC PANEL
ALT: 17 U/L (ref 0–44)
AST: 23 U/L (ref 15–41)
Albumin: 3.6 g/dL (ref 3.5–5.0)
Alkaline Phosphatase: 99 U/L (ref 38–126)
Anion gap: 10 (ref 5–15)
BUN: 35 mg/dL — ABNORMAL HIGH (ref 8–23)
CO2: 23 mmol/L (ref 22–32)
Calcium: 9.5 mg/dL (ref 8.9–10.3)
Chloride: 101 mmol/L (ref 98–111)
Creatinine, Ser: 1.73 mg/dL — ABNORMAL HIGH (ref 0.61–1.24)
GFR calc Af Amer: 39 mL/min — ABNORMAL LOW (ref >60)
GFR calc non Af Amer: 33 mL/min — ABNORMAL LOW (ref >60)
Glucose, Bld: 101 mg/dL — ABNORMAL HIGH (ref 70–99)
Potassium: 4.7 mmol/L (ref 3.5–5.1)
Sodium: 134 mmol/L — ABNORMAL LOW (ref 135–145)
Total Bilirubin: 0.7 mg/dL (ref 0.3–1.2)
Total Protein: 8 g/dL (ref 6.5–8.1)

## 2019-07-23 LAB — TROPONIN I (HIGH SENSITIVITY)
Troponin I (High Sensitivity): 7 ng/L (ref <18)
Troponin I (High Sensitivity): 6 ng/L (ref <18)

## 2019-07-23 LAB — URINALYSIS, ROUTINE W REFLEX MICROSCOPIC
Bilirubin Urine: NEGATIVE
Glucose, UA: NEGATIVE mg/dL
Hgb urine dipstick: NEGATIVE
Ketones, ur: NEGATIVE mg/dL
Nitrite: NEGATIVE
Protein, ur: NEGATIVE mg/dL
Specific Gravity, Urine: 1.016 (ref 1.005–1.030)
WBC, UA: >50 WBC/hpf — ABNORMAL HIGH (ref 0–5)
pH: 6 (ref 5.0–8.0)

## 2019-07-23 LAB — BRAIN NATRIURETIC PEPTIDE: B Natriuretic Peptide: 130.4 pg/mL — ABNORMAL HIGH (ref 0.0–100.0)

## 2019-07-23 LAB — LACTIC ACID, PLASMA: Lactic Acid, Venous: 1.6 mmol/L (ref 0.5–1.9)

## 2019-07-23 LAB — SARS CORONAVIRUS 2 BY RT PCR (HOSPITAL ORDER, PERFORMED IN ~~LOC~~ HOSPITAL LAB): SARS Coronavirus 2: NEGATIVE

## 2019-07-23 MED ORDER — ESCITALOPRAM OXALATE 10 MG PO TABS
20.0000 mg | ORAL_TABLET | Freq: Every day | ORAL | Status: DC
  Administered 2019-07-24 – 2019-07-26 (×2): 20 mg via ORAL
  Filled 2019-07-23 (×3): qty 2

## 2019-07-23 MED ORDER — TRAMADOL HCL 50 MG PO TABS: 50.0000 mg | ORAL_TABLET | Freq: Three times a day (TID) | ORAL | Status: DC | PRN

## 2019-07-23 MED ORDER — FUROSEMIDE 10 MG/ML IJ SOLN
40.0000 mg | Freq: Every day | INTRAMUSCULAR | Status: DC
  Administered 2019-07-24 – 2019-07-25 (×2): 40 mg via INTRAVENOUS
  Filled 2019-07-23 (×2): qty 4

## 2019-07-23 MED ORDER — ACETAMINOPHEN 650 MG RE SUPP: 650.0000 mg | Freq: Four times a day (QID) | RECTAL | Status: DC | PRN

## 2019-07-23 MED ORDER — ALBUTEROL SULFATE (2.5 MG/3ML) 0.083% IN NEBU
2.5000 mg | INHALATION_SOLUTION | Freq: Four times a day (QID) | RESPIRATORY_TRACT | Status: DC
  Administered 2019-07-23 – 2019-07-25 (×8): 2.5 mg via RESPIRATORY_TRACT
  Filled 2019-07-23 (×7): qty 3

## 2019-07-23 MED ORDER — ASCORBIC ACID 500 MG PO TABS
500.0000 mg | ORAL_TABLET | Freq: Every day | ORAL | Status: DC
  Administered 2019-07-24 – 2019-07-26 (×3): 500 mg via ORAL
  Filled 2019-07-23 (×3): qty 1

## 2019-07-23 MED ORDER — MAGNESIUM HYDROXIDE 400 MG/5ML PO SUSP: 30.0000 mL | Freq: Every day | ORAL | Status: DC | PRN

## 2019-07-23 MED ORDER — DOXAZOSIN MESYLATE 1 MG PO TABS: 1.0000 mg | ORAL_TABLET | Freq: Every day | ORAL | Status: DC

## 2019-07-23 MED ORDER — LIDOCAINE 5 % EX PTCH
1.0000 | MEDICATED_PATCH | Freq: Every day | CUTANEOUS | Status: DC
  Administered 2019-07-24 – 2019-07-26 (×2): 1 via TRANSDERMAL
  Filled 2019-07-23 (×3): qty 1

## 2019-07-23 MED ORDER — ACETAMINOPHEN 500 MG PO TABS
1000.0000 mg | ORAL_TABLET | Freq: Once | ORAL | Status: AC
  Administered 2019-07-23: 1000 mg via ORAL
  Filled 2019-07-23: qty 2

## 2019-07-23 MED ORDER — MAGIC MOUTHWASH
10.0000 mL | Freq: Three times a day (TID) | ORAL | Status: DC | PRN
  Filled 2019-07-23: qty 10

## 2019-07-23 MED ORDER — SODIUM CHLORIDE 0.9% FLUSH
3.0000 mL | Freq: Two times a day (BID) | INTRAVENOUS | Status: DC
  Administered 2019-07-24 – 2019-07-26 (×5): 3 mL via INTRAVENOUS

## 2019-07-23 MED ORDER — ACETAMINOPHEN 325 MG PO TABS: 650.0000 mg | ORAL_TABLET | Freq: Four times a day (QID) | ORAL | Status: DC | PRN

## 2019-07-23 MED ORDER — CLONAZEPAM 1 MG PO TABS: 1.0000 mg | ORAL_TABLET | Freq: Three times a day (TID) | ORAL | Status: DC | PRN

## 2019-07-23 MED ORDER — ONDANSETRON HCL 4 MG/2ML IJ SOLN: 4.0000 mg | Freq: Four times a day (QID) | INTRAMUSCULAR | Status: DC | PRN

## 2019-07-23 MED ORDER — ENOXAPARIN SODIUM 40 MG/0.4ML ~~LOC~~ SOLN
40.0000 mg | SUBCUTANEOUS | Status: DC
  Administered 2019-07-23: 40 mg via SUBCUTANEOUS
  Filled 2019-07-23: qty 0.4

## 2019-07-23 MED ORDER — ADULT MULTIVITAMIN W/MINERALS CH
1.0000 | ORAL_TABLET | Freq: Every day | ORAL | Status: DC
  Administered 2019-07-24 – 2019-07-26 (×3): 1 via ORAL
  Filled 2019-07-23 (×3): qty 1

## 2019-07-23 MED ORDER — SODIUM CHLORIDE 0.9 % IV SOLN
500.0000 mg | INTRAVENOUS | Status: DC
  Administered 2019-07-24 – 2019-07-25 (×2): 500 mg via INTRAVENOUS
  Filled 2019-07-23 (×3): qty 500

## 2019-07-23 MED ORDER — ALUM & MAG HYDROXIDE-SIMETH 200-200-20 MG/5ML PO SUSP: 15.0000 mL | Freq: Four times a day (QID) | ORAL | Status: DC | PRN

## 2019-07-23 MED ORDER — SODIUM CHLORIDE 0.9 % IV SOLN: 250.0000 mL | INTRAVENOUS | Status: DC | PRN

## 2019-07-23 MED ORDER — NITROGLYCERIN 0.4 MG SL SUBL: 0.4000 mg | SUBLINGUAL_TABLET | SUBLINGUAL | Status: DC | PRN

## 2019-07-23 MED ORDER — PRAVASTATIN SODIUM 40 MG PO TABS
40.0000 mg | ORAL_TABLET | Freq: Every day | ORAL | Status: DC
  Administered 2019-07-24 – 2019-07-25 (×2): 40 mg via ORAL
  Filled 2019-07-23 (×3): qty 1

## 2019-07-23 MED ORDER — SODIUM CHLORIDE 0.9 % IV SOLN: Freq: Once | INTRAVENOUS | Status: AC

## 2019-07-23 MED ORDER — SODIUM CHLORIDE 0.9 % IV SOLN
1.0000 g | Freq: Once | INTRAVENOUS | Status: DC
  Filled 2019-07-23: qty 1

## 2019-07-23 MED ORDER — ISOSORBIDE MONONITRATE ER 60 MG PO TB24: 30.0000 mg | ORAL_TABLET | Freq: Every day | ORAL | Status: DC

## 2019-07-23 MED ORDER — ZINC SULFATE 220 (50 ZN) MG PO CAPS
220.0000 mg | ORAL_CAPSULE | Freq: Every day | ORAL | Status: DC
  Administered 2019-07-24 – 2019-07-26 (×3): 220 mg via ORAL
  Filled 2019-07-23 (×3): qty 1

## 2019-07-23 MED ORDER — LIDOCAINE VISCOUS HCL 2 % MT SOLN
5.0000 mL | Freq: Three times a day (TID) | OROMUCOSAL | Status: DC
  Administered 2019-07-25: 5 mL via OROMUCOSAL
  Filled 2019-07-23 (×3): qty 15

## 2019-07-23 MED ORDER — GABAPENTIN 300 MG PO CAPS
300.0000 mg | ORAL_CAPSULE | Freq: Every day | ORAL | Status: DC
  Administered 2019-07-23 – 2019-07-25 (×3): 300 mg via ORAL
  Filled 2019-07-23 (×3): qty 1

## 2019-07-23 MED ORDER — RISAQUAD PO CAPS
1.0000 | ORAL_CAPSULE | Freq: Every day | ORAL | Status: DC
  Administered 2019-07-24 – 2019-07-26 (×3): 1 via ORAL
  Filled 2019-07-23 (×3): qty 1

## 2019-07-23 MED ORDER — SODIUM CHLORIDE 0.9 % IV SOLN
2.0000 g | INTRAVENOUS | Status: DC
  Administered 2019-07-24 – 2019-07-26 (×3): 2 g via INTRAVENOUS
  Filled 2019-07-23: qty 2
  Filled 2019-07-23 (×2): qty 20
  Filled 2019-07-23: qty 2

## 2019-07-23 MED ORDER — DOCUSATE SODIUM 100 MG PO CAPS
100.0000 mg | ORAL_CAPSULE | Freq: Two times a day (BID) | ORAL | Status: DC
  Administered 2019-07-23 – 2019-07-26 (×6): 100 mg via ORAL
  Filled 2019-07-23 (×6): qty 1

## 2019-07-23 MED ORDER — SODIUM CHLORIDE 0.9% FLUSH: 3.0000 mL | INTRAVENOUS | Status: DC | PRN

## 2019-07-23 MED ORDER — PANTOPRAZOLE SODIUM 40 MG PO TBEC
40.0000 mg | DELAYED_RELEASE_TABLET | Freq: Every day | ORAL | Status: DC
  Administered 2019-07-24 – 2019-07-26 (×3): 40 mg via ORAL
  Filled 2019-07-23 (×3): qty 1

## 2019-07-23 MED ORDER — VITAMIN D 25 MCG (1000 UNIT) PO TABS
5000.0000 [IU] | ORAL_TABLET | Freq: Every day | ORAL | Status: DC
  Administered 2019-07-25 – 2019-07-26 (×2): 5000 [IU] via ORAL
  Filled 2019-07-23 (×3): qty 5

## 2019-07-23 MED ORDER — ONDANSETRON HCL 4 MG PO TABS: 4.0000 mg | ORAL_TABLET | Freq: Four times a day (QID) | ORAL | Status: DC | PRN

## 2019-07-23 MED ORDER — SENNA 8.6 MG PO TABS
1.0000 | ORAL_TABLET | Freq: Every day | ORAL | Status: DC
  Administered 2019-07-24 – 2019-07-25 (×2): 8.6 mg via ORAL
  Filled 2019-07-23 (×2): qty 1

## 2019-07-23 MED ORDER — TRAZODONE HCL 50 MG PO TABS
25.0000 mg | ORAL_TABLET | Freq: Every evening | ORAL | Status: DC | PRN
  Administered 2019-07-24: 25 mg via ORAL
  Filled 2019-07-23: qty 1

## 2019-07-23 MED ORDER — SODIUM CHLORIDE 0.9 % IV SOLN
500.0000 mg | Freq: Once | INTRAVENOUS | Status: AC
  Administered 2019-07-23: 500 mg via INTRAVENOUS
  Filled 2019-07-23: qty 500

## 2019-07-23 MED ORDER — SODIUM CHLORIDE 0.9 % IV SOLN
1.0000 g | Freq: Once | INTRAVENOUS | Status: AC
  Administered 2019-07-23: 1 g via INTRAVENOUS
  Filled 2019-07-23: qty 10

## 2019-07-23 MED ORDER — LACTOBACILLUS PO TABS: ORAL_TABLET | Freq: Every day | ORAL | Status: DC

## 2019-07-23 MED ORDER — ONDANSETRON HCL 4 MG PO TABS
4.0000 mg | ORAL_TABLET | Freq: Four times a day (QID) | ORAL | Status: DC | PRN
  Administered 2019-07-26: 4 mg via ORAL
  Filled 2019-07-23: qty 1

## 2019-07-23 MED ORDER — METOPROLOL TARTRATE 25 MG PO TABS
12.5000 mg | ORAL_TABLET | Freq: Two times a day (BID) | ORAL | Status: DC
  Administered 2019-07-24 – 2019-07-26 (×5): 12.5 mg via ORAL
  Filled 2019-07-23 (×5): qty 1

## 2019-07-23 MED ORDER — POLYETHYLENE GLYCOL 3350 17 G PO PACK: 17.0000 g | PACK | Freq: Every day | ORAL | Status: DC | PRN

## 2019-07-23 NOTE — ED Notes (Signed)
Pt placed on oxygen at 2lpm for pox of 88% on ra and resp rate 24.

## 2019-07-23 NOTE — ED Notes (Signed)
Pt in ct 

## 2019-07-23 NOTE — ED Notes (Signed)
Pt attempting to get up out of bed. Pt repositioned in bed for comfort. Will recheck temp in approx 30 minutes.

## 2019-07-23 NOTE — H&P (Addendum)
[name] [place] NAME: [name]    MR#:  [id number]  DATE OF BIRTH:  Apr 08, 1923  DATE OF ADMISSION:  07/23/2019  PRIMARY CARE PHYSICIAN: Odessa Fleming, NP   REQUESTING/REFERRING PHYSICIAN: Corinna Capra, MD CHIEF COMPLAINT:   Chief Complaint  Patient presents with  . Fall  . Altered Mental Status    HISTORY OF PRESENT ILLNESS:  [name]  is a 86 y.o. Caucasian male with a known history of coronary artery disease, CHF, hypertension, dyslipidemia, GERD and lymphoma, presented to the emergency room with acute onset of altered mental status and fall without injuries.  According to the patient's wife he has been more confused than his normal and feeling generally weak.  He was noted to have a fever 101.4 in the ER in his pulse currently was down to 88% on room air.  Is a fairly poor historian due to altered mental status as well as hearing loss.  He has been having cough with inability to expectorate.  He has chronic low back pain.  No chest pain or palpitations.  No headache or dizziness or blurred vision.  No paresthesias or focal muscle weakness.  Upon presentation to the emergency room, temperature was 101.4 and blood pressure was 141/68 with otherwise normal vital signs.  Approximately came up to 97% on 2 L of O2 by nasal cannula.  Labs revealed mild hyponatremia, BUN of 35 and creatinine 1.73 above previous levels that were consistent with stage IIIa chronic kidney disease with a BNP of 130.  High-sensitivity troponin I was 7 letter 6.  Lactic acid was 1.6.  CBC showed significant leukocytosis of 19.5 with neutrophilia as well as anemia close to baseline.  UA was positive for UTI.  Urine culture was sent.  Blood cultures were sent as well.  Head and neck CT scan revealed no acute intracranial abnormalities and remote appearing lacunar 1 in the left cerebellum, chronic volume loss and microvascular angiopathy and advanced multilevel cervical spondylitic  changes with mild canal stenosis at C3-4 and moderate stenosis at C5-6 and C6-7 with more moderate to severe stenosis at C4-5 with mild to moderate foraminal narrowing and more severe narrowing bilaterally at C3-4 and C4-5.  It showed prior right mastoidectomy and intracranial and cervical atherosclerosis.  The patient was given IV Rocephin and Zithromax and 1 g of p.o. Tylenol as well as hydration with IV normal saline.  He will be admitted to a progressive unit bed for further evaluation and management.  PAST MEDICAL HISTORY:   Past Medical History:  Diagnosis Date  . (HFpEF) heart failure with preserved ejection fraction ([place])    a. 11/2017 Echo: EF 60-65%, no rwma, GR1 DD. Nl RV fxn.  . Anginal pain ([place])    c/o heaviness a few years ago.  stent placed and all resolved  . Anxiety   . BPH (benign prostatic hypertrophy)   . Carotid stenosis    a. 05/2015 Carotid U/S: mild nonobs atherosclerosis. No need for f/u.  Mar[place] Kitchen Chronic back pain   . Chronic Left Pleural Effusion    a. 11/2017 Mod by CXR.  Marland Kitchen Coronary artery disease    a. 06/2013 Cath/PCI: LAD 50-70p (FFR 0.82-->PCI w/ 3.5x24 Promus DES), RCA subtotally occluded w/ L->R collats, LCX 30ost.  . Dyslipidemia   . GERD (gastroesophageal reflux disease)   . Hx of [name]   . Hyperlipidemia   . Hypertension   . Inguinal hernia   . Lightheadedness  a. chronic, somewhat positional.  . 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 Major depression   . Sleep apnea    will not use cpap    PAST SURGICAL HISTORY:   Past Surgical History:  Procedure Laterality Date  . ANTERIOR APPROACH HEMI HIP ARTHROPLASTY Right 04/16/2016   Procedure: ANTERIOR APPROACH HEMI HIP ARTHROPLASTY;  Surgeon: Hessie Knows, MD;  Location: [facility] ORS;  Service: Orthopedics;  Laterality: Right;  . APPENDECTOMY     86 years old  . arm fracture Right 2008   rod in arm  . CARDIAC CATHETERIZATION  2002   . CARDIAC CATHETERIZATION  06/2013   stent placed  . COLONOSCOPY    . EYE SURGERY Bilateral 2008   cataract extraction  . FEMUR FRACTURE SURGERY Left    with rod  . HEMORROIDECTOMY     75 years ago  . HIP FRACTURE SURGERY    . JOINT REPLACEMENT Bilateral 03/2016   right partial knee replacement, bilateral THR  . KYPHOPLASTY N/A 12/30/2016   Procedure: [id number];  Surgeon: Hessie Knows, MD;  Location: [facility] ORS;  Service: Orthopedics;  Laterality: N/A;    SOCIAL HISTORY:   Social History   Tobacco Use  . Smoking status: Former Smoker    Packs/day: 2.00    Years: 7.00    Pack years: 14.00    Types: Cigarettes    Quit date: 09/17/1953    Years since quitting: 65.8  . Smokeless tobacco: Never Used  Substance Use Topics  . Alcohol use: No    Alcohol/week: 0.0 standard drinks    FAMILY HISTORY:   Family History  Problem Relation Age of Onset  . Heart disease Mother   . Stroke Mother   . Diabetes Father   . Dementia Father   . Stroke Sister   . Stroke Brother   . Stroke Sister   . Stroke Sister   . Stroke Brother   . Stroke Brother     DRUG ALLERGIES:   Allergies  Allergen Reactions  . Cyclobenzaprine Other (See Comments)    Hallucination. Mental instability.  DO NOT GIVE ANY MUSCLE RELAXANTS  . Other Other (See Comments)  . Sulfamethoxazole Other (See Comments)  . Baclofen Other (See Comments)    Confusion and weakness  . Nsaids     Avoids due to liver.  . Sulfamethoxazole-Trimethoprim Nausea Only and Other (See Comments)    Patient unaware of this as an allergy  . Tolmetin Other (See Comments)    Avoids due to liver.it is an nsaid    REVIEW OF SYSTEMS:   ROS As per history of present illness. All pertinent systems were reviewed above. Constitutional,  HEENT, cardiovascular, respiratory, GI, GU, musculoskeletal, neuro, psychiatric, endocrine,  integumentary and hematologic systems were reviewed and are otherwise  negative/unremarkable except  for positive findings mentioned above in the HPI.   MEDICATIONS AT HOME:   Prior to Admission medications   Medication Sig Start Date End Date Taking? Authorizing Provider  acetaminophen (TYLENOL) 500 MG tablet Take 1,000 mg by mouth 3 (three) times daily.    [provider]  acidophilus (RISAQUAD) CAPS capsule Take 1 capsule by mouth daily.    [provider]  albuterol (VENTOLIN HFA) 108 (90 Base) MCG/ACT inhaler Inhale 2 puffs into the lungs every 6 (six) hours. 03/31/19   Nolberto Hanlon, MD  alum & mag hydroxide-simeth (MINTOX) 200-200-20 MG/5ML suspension Take 30 mLs by  mouth every 6 (six) hours as needed for indigestion or heartburn.    [provider]  antiseptic oral rinse (BIOTENE) LIQD 15 mLs by Mouth Rinse route 2 (two) times daily as needed for dry mouth.    [provider]  ascorbic acid (VITAMIN C) 500 MG tablet Take 1 tablet (500 mg total) by mouth daily. 03/31/19   Nolberto Hanlon, MD  Cholecalciferol (VITAMIN D3) 5000 units CAPS Take 5,000 Units by mouth daily.     [provider]  clonazePAM (KLONOPIN) 1 MG tablet Take 1 mg by mouth in the morning, at noon, and at bedtime.     [provider]  diclofenac sodium (VOLTAREN) 1 % GEL Apply 2 g topically 2 (two) times daily as needed (lower back pain). (use when lidocaine patch is off)    [provider]  docusate sodium (COLACE) 100 MG capsule Take 100 mg by mouth 2 (two) times daily.    [provider]  doxazosin (CARDURA) 1 MG tablet Take 1 tablet (1 mg total) by mouth daily. 05/28/17   Lada, Satira Anis, MD  escitalopram (LEXAPRO) 20 MG tablet Take 20 mg by mouth daily.     [provider]  gabapentin (NEURONTIN) 300 MG capsule Take 300 mg by mouth at bedtime.  11/18/18   [provider]  guaiFENesin (ROBITUSSIN) 100 MG/5ML liquid Take 200 mg by mouth every 6 (six) hours as needed for cough.     [provider]  hydrocortisone 2.5 % cream  Apply 1 application topically every other day. (apply to scalp and alternate with ketoconazole) 12/08/18   [provider]  isosorbide mononitrate (IMDUR) 30 MG 24 hr tablet Take 1 tablet (30 mg total) by mouth daily. 02/15/17   Roselee Nova, MD  LACTOBACILLUS PO Take by mouth daily.    [provider]  Lidocaine (ASPERCREME LIDOCAINE) 4 % PTCH Apply 1 patch topically 2 (two) times daily.    [provider]  lidocaine (XYLOCAINE) 2 % solution Use as directed 5 mLs in the mouth or throat 3 (three) times daily. 03/31/19   Nolberto Hanlon, MD  loperamide (IMODIUM) 2 MG capsule Take 2 mg by mouth as needed for diarrhea or loose stools. (max 8 doses in 24 hours)    [provider]  Lutein 10 MG TABS Take 10 mg by mouth 2 (two) times daily.    [provider]  magic mouthwash SOLN Take 10 mLs by mouth 3 (three) times daily as needed for mouth pain. 03/31/19   Nolberto Hanlon, MD  metoprolol tartrate (LOPRESSOR) 25 MG tablet Take 12.5 mg by mouth 2 (two) times daily.     [provider]  Multiple Vitamins-Iron (MULTIVITAMINS WITH IRON) TABS tablet Take 1 tablet by mouth daily.    [provider]  neomycin-polymyxin b-dexamethasone (MAXITROL) 7.4-82707-8.6 OINT 1 application as needed.    [provider]  nitroGLYCERIN (NITROSTAT) 0.4 MG SL tablet Place 0.4 mg under the tongue every 5 (five) minutes as needed for chest pain.    [provider]  ondansetron (ZOFRAN) 4 MG tablet Take 4 mg by mouth every 6 (six) hours as needed for nausea or vomiting.    [provider]  pantoprazole (PROTONIX) 40 MG tablet Take 1 tablet (40 mg total) by mouth daily. 03/16/17   Roselee Nova, MD  Phenylephrine-Cocoa Butter (PREPARATION H RE) Place rectally 4 (four) times daily as needed.    [provider]  polyethylene glycol (  MIRALAX / GLYCOLAX) packet Take 17 g by mouth daily as needed.     [provider]  pravastatin  (PRAVACHOL) 40 MG tablet Take 40 mg by mouth at bedtime.    [provider]  senna (SENOKOT) 8.6 MG TABS tablet Take 1 tablet by mouth at bedtime.     [provider]  torsemide (DEMADEX) 20 MG tablet Take 1 tablet (20 mg) by mouth once a day on Mondays/ Wednesdays/ & Fridays 04/26/19   Loel Dubonnet, NP  traMADol (ULTRAM) 50 MG tablet Take 1 tablet (50 mg total) by mouth every 8 (eight) hours as needed. Patient to carry to his room 09/29/18   Sherrie George B, FNP  triamcinolone cream (KENALOG) 0.1 % Apply 1 application topically 2 (two) times daily.    [provider]  zinc sulfate 220 (50 Zn) MG capsule Take 220 mg by mouth daily.    [provider]      VITAL SIGNS:  Blood pressure (!) 116/58, pulse 83, temperature 98.7 F (37.1 C), temperature source Oral, resp. rate (!) 22, height 5\' 10"  (1.778 m), weight 94 kg, SpO2 90 %.  PHYSICAL EXAMINATION:  Physical Exam  GENERAL:  86 y.o.-year-old Caucasian male patient lying in the bed with mild respiratory distress with conversational dyspnea.   EYES: Pupils equal, round, reactive to light and accommodation. No scleral icterus. Extraocular muscles intact.  HEENT: Head atraumatic, normocephalic. Oropharynx and nasopharynx clear.  NECK:  Supple, no jugular venous distention. No thyroid enlargement, no tenderness.  LUNGS: Diminished bibasal breath sounds with bibasal crackles more on the right than the left. CARDIOVASCULAR: Regular rate and rhythm, S1, S2 normal. No murmurs, rubs, or gallops.  ABDOMEN: Soft, nondistended, nontender. Bowel sounds present. No organomegaly or mass.  EXTREMITIES: No pedal edema, cyanosis, or clubbing.  NEUROLOGIC: Cranial nerves II through XII are intact. Muscle strength 5/5 in all extremities. Sensation intact. Gait not checked.  PSYCHIATRIC: The patient is alert and oriented x 3.  Normal affect and good eye contact. SKIN: No obvious rash, lesion, or ulcer.   LABORATORY  PANEL:   CBC Recent Labs  Lab 07/23/19 1923  WBC 19.5*  HGB 11.3*  HCT 35.1*  PLT 161   ------------------------------------------------------------------------------------------------------------------  Chemistries  Recent Labs  Lab 07/23/19 1923  NA 134*  K 4.7  CL 101  CO2 23  GLUCOSE 101*  BUN 35*  CREATININE 1.73*  CALCIUM 9.5  AST 23  ALT 17  ALKPHOS 99  BILITOT 0.7   ------------------------------------------------------------------------------------------------------------------  Cardiac Enzymes No results for input(s): TROPONINI in the last 168 hours. ------------------------------------------------------------------------------------------------------------------  RADIOLOGY:  DG Chest 2 View  Result Date: 07/23/2019 CLINICAL DATA:  Fall, low-grade temperature EXAM: CHEST - 2 VIEW COMPARISON:  Radiograph 03/27/2019 CT 02/19/2016 FINDINGS: Redemonstration of the chronic, loculated left pleural effusion which extends over the lung apex. Adjacent opacity in the lung likely reflecting some atelectatic change though underlying consolidation is difficult to exclude. Increasing reticular and patchy opacity in the right lung base with indistinct pulmonary vascularity as well as fissural and septal thickening. Cardiomediastinal silhouette is partially obscured by left basilar opacity though overall appears similar to the comparison with a calcified, tortuous aorta. Redemonstration of the midthoracic compression deformity (likely T10) with vertebroplasty changes. Multilevel flowing anterior osteophytosis, compatible with features of diffuse idiopathic skeletal hyperostosis (DISH). Nonunited midshaft right clavicular fracture is in similar of alignment to comparison from February. Chronic deformity of the sternum is also noted. Few subacute to chronic appearing rib  deformities of the left third rib and right second rib. No acute rib fracture. IMPRESSION: 1. Increasing reticular  opacities with indistinct vascularity may reflect developing edema though more patchy opacity in the right lung base could reflect infection as well in the appropriate clinical setting. 2. Chronic, loculated left pleural effusion which extends over the lung apex. Adjacent opacity in the lung likely reflecting some atelectatic change though underlying consolidation is difficult to exclude. 3. Nonunited fracture of the right midclavicle. Stable alignment from priors. 4. Remote bilateral rib fractures. 5. Remote sternal fracture. 6. Features of DISH. Electronically Signed   By: Lovena Le M.D.   On: 07/23/2019 19:24   CT Head Wo Contrast  Result Date: 07/23/2019 CLINICAL DATA:  Fall at 14:30 with increasing confusion EXAM: CT HEAD WITHOUT CONTRAST CT CERVICAL SPINE WITHOUT CONTRAST TECHNIQUE: Multidetector CT imaging of the head and cervical spine was performed following the standard protocol without intravenous contrast. Multiplanar CT image reconstructions of the cervical spine were also generated. COMPARISON:  CT 09/29/2018, 03/27/2019 FINDINGS: CT HEAD FINDINGS Brain: Remote appearing lacune again seen in the left cerebellum. No evidence of acute infarction, hemorrhage, hydrocephalus, extra-axial collection or mass lesion/mass effect. Symmetric prominence of the ventricles, cisterns and sulci compatible with parenchymal volume loss. Patchy areas of white matter hypoattenuation are most compatible with chronic microvascular angiopathy. Vascular: Atherosclerotic calcification of the carotid siphons and intradural vertebral arteries. No hyperdense vessel. Skull: Chronic scarring seen towards the left parietal vertex. No acute focal soft tissue swelling, hematoma, or contusive change. No calvarial fracture or suspicious osseous lesion. Sinuses/Orbits: Mild mural disease in the ethmoid sinuses. Remaining paranasal sinuses and mastoids are predominantly clear with postsurgical changes from prior wall down right  mastoidectomy. Slight hypo pneumatization of the left mastoids. Orbital structures are unremarkable aside from prior lens extractions. Other: Chronic deformity of the nasal bones. CT CERVICAL SPINE FINDINGS Alignment: Stabilization collar absent at the time of examination. Mild leftward cranial rotation. No evidence of traumatic listhesis. No abnormally widened, perched or jumped facets. Normal alignment of the craniocervical and atlantoaxial articulations. Skull base and vertebrae: Advanced arthrosis at the atlantoaxial and basion-dens intervals with hypertrophic change and degenerative spurring. Additional multilevel cervical spondylitic changes and osteophyte formations. Bony fusion of the C5-C7 levels involving the vertebral bodies and posterior elements C5-C7 on the left and the posterior elements on the right at C5-6. No acute skull base fracture, vertebral body fracture or height loss is seen. Evaluation of the lower lumbar levels mildly limited due to a combination of motion artifact and photon starvation. Soft tissues and spinal canal: No pre or paravertebral fluid or swelling. No visible canal hematoma. Disc levels: Multilevel intervertebral disc height loss with spondylitic endplate changes, facet hypertrophic changes, and uncinate spurring. Findings result in some mild canal stenoses at C3-4. Moderate stenoses at C5-6, C6-7 and more moderate to severe stenosis C4-5. There is multilevel mild-to-moderate foraminal narrowing as well with more severe narrowing bilaterally at C3-4 and C4-5. Upper chest: No acute abnormality in the upper chest or imaged lung apices. Other: Calcification of the proximal great vessels as well as the cervical carotid arteries. Normal thyroid. IMPRESSION: 1. No evidence of acute intracranial abnormality. 2. Remote appearing lacune in the left cerebellum. 3. Chronic volume loss and microvascular angiopathy. 4. No evidence of acute fracture or traumatic listhesis of the cervical  spine. 5. Advanced multilevel cervical spondylitic changes with mild canal stenoses at C3-4. Moderate stenoses at C5-6, C6-7 and more moderate to severe stenosis C4-5.  There is multilevel mild-to-moderate foraminal narrowing as well with more severe narrowing bilaterally at C3-4 and C4-5. 6. Prior right mastoidectomy. 7. Intracranial and cervical atherosclerosis. Electronically Signed   By: Lovena Le M.D.   On: 07/23/2019 19:18   CT Cervical Spine Wo Contrast  Result Date: 07/23/2019 CLINICAL DATA:  Fall at 14:30 with increasing confusion EXAM: CT HEAD WITHOUT CONTRAST CT CERVICAL SPINE WITHOUT CONTRAST TECHNIQUE: Multidetector CT imaging of the head and cervical spine was performed following the standard protocol without intravenous contrast. Multiplanar CT image reconstructions of the cervical spine were also generated. COMPARISON:  CT 09/29/2018, 03/27/2019 FINDINGS: CT HEAD FINDINGS Brain: Remote appearing lacune again seen in the left cerebellum. No evidence of acute infarction, hemorrhage, hydrocephalus, extra-axial collection or mass lesion/mass effect. Symmetric prominence of the ventricles, cisterns and sulci compatible with parenchymal volume loss. Patchy areas of white matter hypoattenuation are most compatible with chronic microvascular angiopathy. Vascular: Atherosclerotic calcification of the carotid siphons and intradural vertebral arteries. No hyperdense vessel. Skull: Chronic scarring seen towards the left parietal vertex. No acute focal soft tissue swelling, hematoma, or contusive change. No calvarial fracture or suspicious osseous lesion. Sinuses/Orbits: Mild mural disease in the ethmoid sinuses. Remaining paranasal sinuses and mastoids are predominantly clear with postsurgical changes from prior wall down right mastoidectomy. Slight hypo pneumatization of the left mastoids. Orbital structures are unremarkable aside from prior lens extractions. Other: Chronic deformity of the nasal bones. CT  CERVICAL SPINE FINDINGS Alignment: Stabilization collar absent at the time of examination. Mild leftward cranial rotation. No evidence of traumatic listhesis. No abnormally widened, perched or jumped facets. Normal alignment of the craniocervical and atlantoaxial articulations. Skull base and vertebrae: Advanced arthrosis at the atlantoaxial and basion-dens intervals with hypertrophic change and degenerative spurring. Additional multilevel cervical spondylitic changes and osteophyte formations. Bony fusion of the C5-C7 levels involving the vertebral bodies and posterior elements C5-C7 on the left and the posterior elements on the right at C5-6. No acute skull base fracture, vertebral body fracture or height loss is seen. Evaluation of the lower lumbar levels mildly limited due to a combination of motion artifact and photon starvation. Soft tissues and spinal canal: No pre or paravertebral fluid or swelling. No visible canal hematoma. Disc levels: Multilevel intervertebral disc height loss with spondylitic endplate changes, facet hypertrophic changes, and uncinate spurring. Findings result in some mild canal stenoses at C3-4. Moderate stenoses at C5-6, C6-7 and more moderate to severe stenosis C4-5. There is multilevel mild-to-moderate foraminal narrowing as well with more severe narrowing bilaterally at C3-4 and C4-5. Upper chest: No acute abnormality in the upper chest or imaged lung apices. Other: Calcification of the proximal great vessels as well as the cervical carotid arteries. Normal thyroid. IMPRESSION: 1. No evidence of acute intracranial abnormality. 2. Remote appearing lacune in the left cerebellum. 3. Chronic volume loss and microvascular angiopathy. 4. No evidence of acute fracture or traumatic listhesis of the cervical spine. 5. Advanced multilevel cervical spondylitic changes with mild canal stenoses at C3-4. Moderate stenoses at C5-6, C6-7 and more moderate to severe stenosis C4-5. There is  multilevel mild-to-moderate foraminal narrowing as well with more severe narrowing bilaterally at C3-4 and C4-5. 6. Prior right mastoidectomy. 7. Intracranial and cervical atherosclerosis. Electronically Signed   By: Lovena Le M.D.   On: 07/23/2019 19:18      IMPRESSION AND PLAN:   1.  Sepsis secondary to UTI and community-acquired pneumonia with subsequent metabolic encephalopathy.  No evidence for severe sepsis or septic shock.  Sepsis is based on his fever, tachypnea and leukocytosis. -The patient will be admitted to a progressive unit bed. -He will be placed on IV Rocephin and Zithromax. -Mucolytic therapy be provided as well as duo nebs. -We will follow sputum culture as well as blood cultures.  2.  Acute on chronic diastolic CHF. -Last echo was in 2019 and revealed grade 1 diastolic dysfunction with EF of 60 to 65%. -The patient will be diuresed with IV Lasix and will follow serial troponin I's. -We will obtain a 2D echo in a.m. -This could be cor pulmonale secondary to his pneumonia.  3.  Anxiety and depression. -Lexapro and Klonopin will be continued.  4.  Peripheral neuropathy. -We will continue Neurontin.  5.  GERD. -We will continue PPI therapy.  6.  Dyslipidemia. -We will continue statin therapy.  7.  DVT prophylaxis. -Subcutaneous Lovenox.   All the records are reviewed and case discussed with ED provider. The plan of care was discussed in details with the patient (and family). I answered all questions. The patient agreed to proceed with the above mentioned plan. Further management will depend upon hospital course.   CODE STATUS: Full code  Status is: Inpatient  Remains inpatient appropriate because:Altered mental status, Ongoing diagnostic testing needed not appropriate for outpatient work up, Unsafe d/c plan, IV treatments appropriate due to intensity of illness or inability to take PO and Inpatient level of care appropriate due to severity of  illness   Dispo: The patient is from: Home              Anticipated d/c is to: Home              Anticipated d/c date is: 2 days              Patient currently is not medically stable to d/c.  TOTAL TIME TAKING CARE OF THIS PATIENT: 55 minutes.    Christel Mormon M.D on 07/23/2019 at 11:21 PM  [facility]   From 7 PM-7 AM, contact night-coverage [email or web address]  CC: Primary care physician; Odessa Fleming, NP   Note: This dictation was prepared with Dragon dictation along with smaller phrase technology. Any transcriptional errors that result from this process are unintentional.

## 2019-07-23 NOTE — ED Provider Notes (Signed)
[facility] Emergency Department Provider Note   ____________________________________________   First MD Initiated Contact with Patient 07/23/19 1944     (approximate)  I have reviewed the triage vital signs and the nursing notes.   HISTORY  Chief Complaint Fall and Altered Mental Status    HPI [name] is a 86 y.o. male who comes from [place].  Wife said he was a little more confused than normal on a little weaker than normal.  Here in the emergency room he has a fever of 101 O2 sats of 88%.  He is slightly confused but can answer most questions without trouble.  O2 sat goes up to 95 on oxygen.  He says he has had some coughing but nothing much coming up.  He has low back pain but this is chronic.  He has no other complaints.  No other history is obtainable at this point.  Symptoms started today near as I can tell.         Past Medical History:  Diagnosis Date  . (HFpEF) heart failure with preserved ejection fraction ([place])    a. 11/2017 Echo: EF 60-65%, no rwma, GR1 DD. Nl RV fxn.  . Anginal pain ([place])    c/o heaviness a few years ago.  stent placed and all resolved  . Anxiety   . BPH (benign prostatic hypertrophy)   . Carotid stenosis    a. 05/2015 Carotid U/S: mild nonobs atherosclerosis. No need for f/u.  Marland Kitchen Chronic back pain   . Chronic Left Pleural Effusion    a. 11/2017 Mod by CXR.  Marland Kitchen Coronary artery disease    a. 06/2013 Cath/PCI: LAD 50-70p (FFR 0.82-->PCI w/ 3.5x24 Promus DES), RCA subtotally occluded w/ L->R collats, LCX 30ost.  . Dyslipidemia   . GERD (gastroesophageal reflux disease)   . Hx of [name]   . Hyperlipidemia   . Hypertension   . Inguinal hernia   . Lightheadedness    a. chronic, somewhat positional.  . 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 Major depression   . Sleep apnea    will not use cpap    Patient Active Problem List   Diagnosis Date Noted  . Advanced care planning/counseling discussion   . Goals of care, counseling/discussion   . Pressure injury of skin 03/28/2019  . Acute on chronic diastolic heart failure ([place]) 03/27/2019  . Pneumonia due to COVID-19 virus 03/27/2019  . COVID-19 03/27/2019  . Malnutrition of moderate degree 06/15/2017  . Pneumonia 06/13/2017  . 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 S/P kyphoplasty 01/14/2017  . Respiratory failure, post-operative ([place]) 12/31/2016  . Acute respiratory failure ([place]) 12/30/2016  . Altered mental status 12/07/2016  . Acute kidney injury ([place]) 09/14/2016  . Head injury, intracranial, without loss of consciousness or fracture ([place]) 09/07/2016  . Groin pain, left 06/15/2016  . Closed right hip fracture, with routine healing, subsequent encounter 04/15/2016  . Productive cough 03/23/2016  . Medicare annual wellness visit, subsequent 02/19/2016  . Major depression 10/14/2015  . Behavior disturbance 09/12/2015  . Mobility impaired 08/27/2015  . Skin ulcer of back ([place]) 07/01/2015  . Laceration of right hand 07/01/2015  . Traumatic hematoma of lower back 07/01/2015  . Encounters for administrative purpose 04/01/2015  . Skin lesion of face 12/24/2014  . Contusion of right hand 11/28/2014  . Herpes zoster 10/30/2014  .  Need for immunization against influenza 10/24/2014  . Compression fracture of L4 lumbar vertebra 09/28/2014  . Rectal or anal pain 08/13/2014  . Anxiety disorder 07/23/2014  . BPH without obstruction/lower urinary tract symptoms 07/23/2014  . Atherosclerosis of coronary artery 07/23/2014  . Chronic LBP 07/23/2014  . Hypercholesteremia 07/23/2014  . Benign hypertension 07/23/2014  . Acid reflux 07/23/2014  . Chronic recurrent major depressive disorder ([place]) 07/23/2014  . GERD (gastroesophageal reflux disease) 07/23/2014  . Coronary artery disease involving native coronary artery without  angina pectoris 07/06/2014  . Essential hypertension 07/06/2014  . Carotid stenosis 07/06/2014  . Hyperlipidemia   . H/O [name] 12/20/2011    Past Surgical History:  Procedure Laterality Date  . ANTERIOR APPROACH HEMI HIP ARTHROPLASTY Right 04/16/2016   Procedure: ANTERIOR APPROACH HEMI HIP ARTHROPLASTY;  Surgeon: Hessie Knows, MD;  Location: [facility] ORS;  Service: Orthopedics;  Laterality: Right;  . APPENDECTOMY     86 years old  . arm fracture Right 2008   rod in arm  . CARDIAC CATHETERIZATION  2002  . CARDIAC CATHETERIZATION  06/2013   stent placed  . COLONOSCOPY    . EYE SURGERY Bilateral 2008   cataract extraction  . FEMUR FRACTURE SURGERY Left    with rod  . HEMORROIDECTOMY     75 years ago  . HIP FRACTURE SURGERY    . JOINT REPLACEMENT Bilateral 03/2016   right partial knee replacement, bilateral THR  . KYPHOPLASTY N/A 12/30/2016   Procedure: [id number];  Surgeon: Hessie Knows, MD;  Location: [facility] ORS;  Service: Orthopedics;  Laterality: N/A;    Prior to Admission medications   Medication Sig Start Date End Date Taking? Authorizing Provider  acetaminophen (TYLENOL) 500 MG tablet Take 1,000 mg by mouth 3 (three) times daily.    [provider]  acidophilus (RISAQUAD) CAPS capsule Take 1 capsule by mouth daily.    [provider]  albuterol (VENTOLIN HFA) 108 (90 Base) MCG/ACT inhaler Inhale 2 puffs into the lungs every 6 (six) hours. 03/31/19   Nolberto Hanlon, MD  alum & mag hydroxide-simeth (MINTOX) 200-200-20 MG/5ML suspension Take 30 mLs by mouth every 6 (six) hours as needed for indigestion or heartburn.    [provider]  antiseptic oral rinse (BIOTENE) LIQD 15 mLs by Mouth Rinse route 2 (two) times daily as needed for dry mouth.    [provider]  ascorbic acid (VITAMIN C) 500 MG tablet Take 1 tablet (500 mg total) by mouth daily. 03/31/19   Nolberto Hanlon, MD  Cholecalciferol (VITAMIN D3) 5000 units CAPS Take 5,000 Units by  mouth daily.     [provider]  clonazePAM (KLONOPIN) 1 MG tablet Take 1 mg by mouth in the morning, at noon, and at bedtime.     [provider]  diclofenac sodium (VOLTAREN) 1 % GEL Apply 2 g topically 2 (two) times daily as needed (lower back pain). (use when lidocaine patch is off)    [provider]  docusate sodium (COLACE) 100 MG capsule Take 100 mg by mouth 2 (two) times daily.    [provider]  doxazosin (CARDURA) 1 MG tablet Take 1 tablet (1 mg total) by mouth daily. 05/28/17   Lada, Satira Anis, MD  escitalopram (LEXAPRO) 20 MG tablet Take 20 mg by mouth daily.     [provider]  gabapentin (NEURONTIN) 300 MG capsule Take 300 mg by mouth at bedtime.  11/18/18   [provider]  guaiFENesin (ROBITUSSIN) 100 MG/5ML  liquid Take 200 mg by mouth every 6 (six) hours as needed for cough.     [provider]  hydrocortisone 2.5 % cream Apply 1 application topically every other day. (apply to scalp and alternate with ketoconazole) 12/08/18   [provider]  isosorbide mononitrate (IMDUR) 30 MG 24 hr tablet Take 1 tablet (30 mg total) by mouth daily. 02/15/17   Roselee Nova, MD  LACTOBACILLUS PO Take by mouth daily.    [provider]  Lidocaine (ASPERCREME LIDOCAINE) 4 % PTCH Apply 1 patch topically 2 (two) times daily.    [provider]  lidocaine (XYLOCAINE) 2 % solution Use as directed 5 mLs in the mouth or throat 3 (three) times daily. 03/31/19   Nolberto Hanlon, MD  loperamide (IMODIUM) 2 MG capsule Take 2 mg by mouth as needed for diarrhea or loose stools. (max 8 doses in 24 hours)    [provider]  Lutein 10 MG TABS Take 10 mg by mouth 2 (two) times daily.    [provider]  magic mouthwash SOLN Take 10 mLs by mouth 3 (three) times daily as needed for mouth pain. 03/31/19   Nolberto Hanlon, MD  metoprolol tartrate (LOPRESSOR) 25 MG tablet Take 12.5 mg by mouth 2 (two) times daily.      [provider]  Multiple Vitamins-Iron (MULTIVITAMINS WITH IRON) TABS tablet Take 1 tablet by mouth daily.    [provider]  neomycin-polymyxin b-dexamethasone (MAXITROL) 6.7-61950-9.3 OINT 1 application as needed.    [provider]  nitroGLYCERIN (NITROSTAT) 0.4 MG SL tablet Place 0.4 mg under the tongue every 5 (five) minutes as needed for chest pain.    [provider]  ondansetron (ZOFRAN) 4 MG tablet Take 4 mg by mouth every 6 (six) hours as needed for nausea or vomiting.    [provider]  pantoprazole (PROTONIX) 40 MG tablet Take 1 tablet (40 mg total) by mouth daily. 03/16/17   Roselee Nova, MD  Phenylephrine-Cocoa Butter (PREPARATION H RE) Place rectally 4 (four) times daily as needed.    [provider]  polyethylene glycol (MIRALAX / GLYCOLAX) packet Take 17 g by mouth daily as needed.     [provider]  pravastatin (PRAVACHOL) 40 MG tablet Take 40 mg by mouth at bedtime.    [provider]  senna (SENOKOT) 8.6 MG TABS tablet Take 1 tablet by mouth at bedtime.     [provider]  torsemide (DEMADEX) 20 MG tablet Take 1 tablet (20 mg) by mouth once a day on Mondays/ Wednesdays/ & Fridays 04/26/19   Loel Dubonnet, NP  traMADol (ULTRAM) 50 MG tablet Take 1 tablet (50 mg total) by mouth every 8 (eight) hours as needed. Patient to carry to his room 09/29/18   Sherrie George B, FNP  triamcinolone cream (KENALOG) 0.1 % Apply 1 application topically 2 (two) times daily.    [provider]  zinc sulfate 220 (50 Zn) MG capsule Take 220 mg by mouth daily.    [provider]    Allergies Cyclobenzaprine, Other, Sulfamethoxazole, Baclofen, Nsaids, Sulfamethoxazole-trimethoprim, and Tolmetin  Family History  Problem Relation Age of Onset  . Heart disease Mother   . Stroke Mother   . Diabetes Father   . Dementia Father   . Stroke Sister   . Stroke Brother   . Stroke Sister   .  Stroke Sister   . Stroke Brother   . Stroke Brother  Social History Social History   Tobacco Use  . Smoking status: Former Smoker    Packs/day: 2.00    Years: 7.00    Pack years: 14.00    Types: Cigarettes    Quit date: 09/17/1953    Years since quitting: 65.8  . Smokeless tobacco: Never Used  Substance Use Topics  . Alcohol use: No    Alcohol/week: 0.0 standard drinks  . Drug use: No    Review of Systems  Constitutional:  fever/chills Eyes: No visual changes. [name]: No sore throat. Cardiovascular: Denies chest pain. Respiratory: Slight shortness of breath. Gastrointestinal: No abdominal pain.  No nausea, no vomiting.  No diarrhea.  No constipation. Genitourinary: Negative for dysuria. Musculoskeletal: Chronic back pain. Skin: Negative for rash. Neurological: Negative for headaches, focal weakness   ____________________________________________   PHYSICAL EXAM:  VITAL SIGNS: ED Triage Vitals  Enc Vitals Group     BP 07/23/19 1918 (!) 141/68     Pulse Rate 07/23/19 1844 96     Resp 07/23/19 1844 20     Temp 07/23/19 1844 (!) 101.4 F (38.6 C)     Temp Source 07/23/19 1844 Rectal     SpO2 07/23/19 1844 94 %     Weight 07/23/19 1918 207 lb 3.7 oz (94 kg)     Height 07/23/19 1918 5\' 10"  (1.778 m)     Head Circumference --      Peak Flow --      Pain Score --      Pain Loc --      Pain Edu? --      Excl. in [place]? --     Constitutional: Slightly sleepy but oriented and can answer questions Eyes: Conjunctivae are normal. PER. EOMI. Head: Atraumatic. Nose: No congestion/rhinnorhea. Mouth/Throat: Mucous membranes are moist.  Oropharynx non-erythematous. Neck: No stridor. Cardiovascular: Normal rate, regular rhythm. Grossly normal heart sounds.  Good peripheral circulation. Respiratory: Normal respiratory effort.  No retractions. Lungs scattered crackles Gastrointestinal: Soft and nontender. No distention. No abdominal bruits. No CVA  tenderness. Musculoskeletal: No lower extremity tenderness some bilateral edema.  Low mid spine tenderness which is chronic per patient.  Skin:  Skin is warm, dry and intact. No rash noted.   ____________________________________________   LABS (all labs ordered are listed, but only abnormal results are displayed)  Labs Reviewed  CBC WITH DIFFERENTIAL/PLATELET - Abnormal; Notable for the following components:      Result Value   WBC 19.5 (*)    RBC 3.91 (*)    Hemoglobin 11.3 (*)    HCT 35.1 (*)    Neutro Abs 17.0 (*)    Monocytes Absolute 1.1 (*)    Abs Immature Granulocytes 0.20 (*)    All other components within normal limits  COMPREHENSIVE METABOLIC PANEL - Abnormal; Notable for the following components:   Sodium 134 (*)    Glucose, Bld 101 (*)    BUN 35 (*)    Creatinine, Ser 1.73 (*)    GFR calc non Af Amer 33 (*)    GFR calc Af Amer 39 (*)    All other components within normal limits  URINALYSIS, ROUTINE W REFLEX MICROSCOPIC - Abnormal; Notable for the following components:   Color, Urine YELLOW (*)    APPearance CLOUDY (*)    Leukocytes,Ua LARGE (*)    WBC, UA >50 (*)    Bacteria, UA MANY (*)    All other components within normal limits  BRAIN NATRIURETIC PEPTIDE - Abnormal; Notable for the  following components:   B Natriuretic Peptide 130.4 (*)    All other components within normal limits  CULTURE, BLOOD (ROUTINE X 2)  CULTURE, BLOOD (ROUTINE X 2)  URINE CULTURE  SARS CORONAVIRUS 2 BY RT PCR (HOSPITAL ORDER, [facility])  LACTIC ACID, PLASMA  LACTIC ACID, PLASMA  TROPONIN I (HIGH SENSITIVITY)  TROPONIN I (HIGH SENSITIVITY)   ____________________________________________  EKG  EKG read interpreted by me shows normal sinus rhythm rate of 95 normal axis no acute ST-T wave changes very poor baseline. ____________________________________________  RADIOLOGY  ED MD interpretation: Chest x-ray read by radiology reviewed by me shows CHF  versus pneumonia.  Patient's BNP is 130 which is high but not markedly so.  His white count is 19,000.  Official radiology report(s): DG Chest 2 View  Result Date: 07/23/2019 CLINICAL DATA:  Fall, low-grade temperature EXAM: CHEST - 2 VIEW COMPARISON:  Radiograph 03/27/2019 CT 02/19/2016 FINDINGS: Redemonstration of the chronic, loculated left pleural effusion which extends over the lung apex. Adjacent opacity in the lung likely reflecting some atelectatic change though underlying consolidation is difficult to exclude. Increasing reticular and patchy opacity in the right lung base with indistinct pulmonary vascularity as well as fissural and septal thickening. Cardiomediastinal silhouette is partially obscured by left basilar opacity though overall appears similar to the comparison with a calcified, tortuous aorta. Redemonstration of the midthoracic compression deformity (likely T10) with vertebroplasty changes. Multilevel flowing anterior osteophytosis, compatible with features of diffuse idiopathic skeletal hyperostosis (DISH). Nonunited midshaft right clavicular fracture is in similar of alignment to comparison from February. Chronic deformity of the sternum is also noted. Few subacute to chronic appearing rib deformities of the left third rib and right second rib. No acute rib fracture. IMPRESSION: 1. Increasing reticular opacities with indistinct vascularity may reflect developing edema though more patchy opacity in the right lung base could reflect infection as well in the appropriate clinical setting. 2. Chronic, loculated left pleural effusion which extends over the lung apex. Adjacent opacity in the lung likely reflecting some atelectatic change though underlying consolidation is difficult to exclude. 3. Nonunited fracture of the right midclavicle. Stable alignment from priors. 4. Remote bilateral rib fractures. 5. Remote sternal fracture. 6. Features of DISH. Electronically Signed   By: Lovena Le  M.D.   On: 07/23/2019 19:24   CT Head Wo Contrast  Result Date: 07/23/2019 CLINICAL DATA:  Fall at 14:30 with increasing confusion EXAM: CT HEAD WITHOUT CONTRAST CT CERVICAL SPINE WITHOUT CONTRAST TECHNIQUE: Multidetector CT imaging of the head and cervical spine was performed following the standard protocol without intravenous contrast. Multiplanar CT image reconstructions of the cervical spine were also generated. COMPARISON:  CT 09/29/2018, 03/27/2019 FINDINGS: CT HEAD FINDINGS Brain: Remote appearing lacune again seen in the left cerebellum. No evidence of acute infarction, hemorrhage, hydrocephalus, extra-axial collection or mass lesion/mass effect. Symmetric prominence of the ventricles, cisterns and sulci compatible with parenchymal volume loss. Patchy areas of white matter hypoattenuation are most compatible with chronic microvascular angiopathy. Vascular: Atherosclerotic calcification of the carotid siphons and intradural vertebral arteries. No hyperdense vessel. Skull: Chronic scarring seen towards the left parietal vertex. No acute focal soft tissue swelling, hematoma, or contusive change. No calvarial fracture or suspicious osseous lesion. Sinuses/Orbits: Mild mural disease in the ethmoid sinuses. Remaining paranasal sinuses and mastoids are predominantly clear with postsurgical changes from prior wall down right mastoidectomy. Slight hypo pneumatization of the left mastoids. Orbital structures are unremarkable aside from prior lens extractions. Other: Chronic  deformity of the nasal bones. CT CERVICAL SPINE FINDINGS Alignment: Stabilization collar absent at the time of examination. Mild leftward cranial rotation. No evidence of traumatic listhesis. No abnormally widened, perched or jumped facets. Normal alignment of the craniocervical and atlantoaxial articulations. Skull base and vertebrae: Advanced arthrosis at the atlantoaxial and basion-dens intervals with hypertrophic change and degenerative  spurring. Additional multilevel cervical spondylitic changes and osteophyte formations. Bony fusion of the C5-C7 levels involving the vertebral bodies and posterior elements C5-C7 on the left and the posterior elements on the right at C5-6. No acute skull base fracture, vertebral body fracture or height loss is seen. Evaluation of the lower lumbar levels mildly limited due to a combination of motion artifact and photon starvation. Soft tissues and spinal canal: No pre or paravertebral fluid or swelling. No visible canal hematoma. Disc levels: Multilevel intervertebral disc height loss with spondylitic endplate changes, facet hypertrophic changes, and uncinate spurring. Findings result in some mild canal stenoses at C3-4. Moderate stenoses at C5-6, C6-7 and more moderate to severe stenosis C4-5. There is multilevel mild-to-moderate foraminal narrowing as well with more severe narrowing bilaterally at C3-4 and C4-5. Upper chest: No acute abnormality in the upper chest or imaged lung apices. Other: Calcification of the proximal great vessels as well as the cervical carotid arteries. Normal thyroid. IMPRESSION: 1. No evidence of acute intracranial abnormality. 2. Remote appearing lacune in the left cerebellum. 3. Chronic volume loss and microvascular angiopathy. 4. No evidence of acute fracture or traumatic listhesis of the cervical spine. 5. Advanced multilevel cervical spondylitic changes with mild canal stenoses at C3-4. Moderate stenoses at C5-6, C6-7 and more moderate to severe stenosis C4-5. There is multilevel mild-to-moderate foraminal narrowing as well with more severe narrowing bilaterally at C3-4 and C4-5. 6. Prior right mastoidectomy. 7. Intracranial and cervical atherosclerosis. Electronically Signed   By: Lovena Le M.D.   On: 07/23/2019 19:18   CT Cervical Spine Wo Contrast  Result Date: 07/23/2019 CLINICAL DATA:  Fall at 14:30 with increasing confusion EXAM: CT HEAD WITHOUT CONTRAST CT CERVICAL  SPINE WITHOUT CONTRAST TECHNIQUE: Multidetector CT imaging of the head and cervical spine was performed following the standard protocol without intravenous contrast. Multiplanar CT image reconstructions of the cervical spine were also generated. COMPARISON:  CT 09/29/2018, 03/27/2019 FINDINGS: CT HEAD FINDINGS Brain: Remote appearing lacune again seen in the left cerebellum. No evidence of acute infarction, hemorrhage, hydrocephalus, extra-axial collection or mass lesion/mass effect. Symmetric prominence of the ventricles, cisterns and sulci compatible with parenchymal volume loss. Patchy areas of white matter hypoattenuation are most compatible with chronic microvascular angiopathy. Vascular: Atherosclerotic calcification of the carotid siphons and intradural vertebral arteries. No hyperdense vessel. Skull: Chronic scarring seen towards the left parietal vertex. No acute focal soft tissue swelling, hematoma, or contusive change. No calvarial fracture or suspicious osseous lesion. Sinuses/Orbits: Mild mural disease in the ethmoid sinuses. Remaining paranasal sinuses and mastoids are predominantly clear with postsurgical changes from prior wall down right mastoidectomy. Slight hypo pneumatization of the left mastoids. Orbital structures are unremarkable aside from prior lens extractions. Other: Chronic deformity of the nasal bones. CT CERVICAL SPINE FINDINGS Alignment: Stabilization collar absent at the time of examination. Mild leftward cranial rotation. No evidence of traumatic listhesis. No abnormally widened, perched or jumped facets. Normal alignment of the craniocervical and atlantoaxial articulations. Skull base and vertebrae: Advanced arthrosis at the atlantoaxial and basion-dens intervals with hypertrophic change and degenerative spurring. Additional multilevel cervical spondylitic changes and osteophyte formations. Bony fusion of the  C5-C7 levels involving the vertebral bodies and posterior elements C5-C7  on the left and the posterior elements on the right at C5-6. No acute skull base fracture, vertebral body fracture or height loss is seen. Evaluation of the lower lumbar levels mildly limited due to a combination of motion artifact and photon starvation. Soft tissues and spinal canal: No pre or paravertebral fluid or swelling. No visible canal hematoma. Disc levels: Multilevel intervertebral disc height loss with spondylitic endplate changes, facet hypertrophic changes, and uncinate spurring. Findings result in some mild canal stenoses at C3-4. Moderate stenoses at C5-6, C6-7 and more moderate to severe stenosis C4-5. There is multilevel mild-to-moderate foraminal narrowing as well with more severe narrowing bilaterally at C3-4 and C4-5. Upper chest: No acute abnormality in the upper chest or imaged lung apices. Other: Calcification of the proximal great vessels as well as the cervical carotid arteries. Normal thyroid. IMPRESSION: 1. No evidence of acute intracranial abnormality. 2. Remote appearing lacune in the left cerebellum. 3. Chronic volume loss and microvascular angiopathy. 4. No evidence of acute fracture or traumatic listhesis of the cervical spine. 5. Advanced multilevel cervical spondylitic changes with mild canal stenoses at C3-4. Moderate stenoses at C5-6, C6-7 and more moderate to severe stenosis C4-5. There is multilevel mild-to-moderate foraminal narrowing as well with more severe narrowing bilaterally at C3-4 and C4-5. 6. Prior right mastoidectomy. 7. Intracranial and cervical atherosclerosis. Electronically Signed   By: Lovena Le M.D.   On: 07/23/2019 19:18    ____________________________________________   PROCEDURES  Procedure(s) performed (including Critical Care): Critical care time half an hour.  This includes evaluating the patient going in and speaking to him again later reviewing his lab work and x-rays and speaking with the hospitalist.  I also reviewed his old  records.  Procedures   ____________________________________________   INITIAL IMPRESSION / ASSESSMENT AND PLAN / ED COURSE  Patient with altered mental status fever high white count.  He does not have a high lactic acid.  He has 2 possible sites for his infection.  These are his lungs which may represent pneumonia or CHF and his urine which shows white blood count greater than 50 white cells and many bacteria.  I have given his gentleman Rocephin which should cover UTI and Zithromax which will help the Rocephin and cover his possible pneumonia.              ____________________________________________   FINAL CLINICAL IMPRESSION(S) / ED DIAGNOSES  Final diagnoses:  Altered mental status, unspecified altered mental status type  Urinary tract infection without hematuria, site unspecified  Community acquired pneumonia, unspecified laterality  Congestive heart failure, unspecified HF chronicity, unspecified heart failure type [facility])     ED Discharge Orders    None       Note:  This document was prepared using Dragon voice recognition software and may include unintentional dictation errors.    Nena Polio, MD 07/23/19 2118

## 2019-07-23 NOTE — ED Notes (Signed)
PT IS FROM [facility], [facility] STAFF UPDATED ON ADMISSION

## 2019-07-23 NOTE — ED Notes (Signed)
Pt moved onto hospital bed. Pt is currently dry. Pt encouraged to leave oxygen in place. Pt has both hearing aids in ears. Call bell on left side.

## 2019-07-23 NOTE — ED Triage Notes (Signed)
Pt arrives via ems from [facility], fell at 1430, wife stated that he was a little more confused than normal, ems found pt to have a temp of 100. Pt doesn't usually ambulate according to ems, ems also was told pt is usually alert and oriented x 3

## 2019-07-23 NOTE — ED Notes (Signed)
Pt refuses to leave oxygen in place.

## 2019-07-24 ENCOUNTER — Inpatient Hospital Stay (HOSPITAL_COMMUNITY)
Admit: 2019-07-24 | Discharge: 2019-07-24 | Disposition: A | Discharge status: Home / Self Care | Payer: Medicare Other | Attending: Family Medicine | Admitting: Family Medicine

## 2019-07-24 DIAGNOSIS — R6520 Severe sepsis without septic shock: Secondary | ICD-10-CM

## 2019-07-24 DIAGNOSIS — I5031 Acute diastolic (congestive) heart failure: Secondary | ICD-10-CM

## 2019-07-24 DIAGNOSIS — W19XXXA Unspecified fall, initial encounter: Secondary | ICD-10-CM

## 2019-07-24 DIAGNOSIS — Y92009 Unspecified place in unspecified non-institutional (private) residence as the place of occurrence of the external cause: Secondary | ICD-10-CM

## 2019-07-24 LAB — PROCALCITONIN: Procalcitonin: 1.61 ng/mL

## 2019-07-24 LAB — ECHOCARDIOGRAM COMPLETE
Height: 70 in
Weight: 3315.72 oz

## 2019-07-24 LAB — CBC
HCT: 30.7 % — ABNORMAL LOW (ref 39.0–52.0)
Hemoglobin: 10.2 g/dL — ABNORMAL LOW (ref 13.0–17.0)
MCH: 29.1 pg (ref 26.0–34.0)
MCHC: 33.2 g/dL (ref 30.0–36.0)
MCV: 87.7 fL (ref 80.0–100.0)
Platelets: 148 10*3/uL — ABNORMAL LOW (ref 150–400)
RBC: 3.5 MIL/uL — ABNORMAL LOW (ref 4.22–5.81)
RDW: 14.6 % (ref 11.5–15.5)
WBC: 22.3 10*3/uL — ABNORMAL HIGH (ref 4.0–10.5)
nRBC: 0 % (ref 0.0–0.2)

## 2019-07-24 LAB — BASIC METABOLIC PANEL
Anion gap: 6 (ref 5–15)
BUN: 36 mg/dL — ABNORMAL HIGH (ref 8–23)
CO2: 28 mmol/L (ref 22–32)
Calcium: 9.1 mg/dL (ref 8.9–10.3)
Chloride: 102 mmol/L (ref 98–111)
Creatinine, Ser: 1.82 mg/dL — ABNORMAL HIGH (ref 0.61–1.24)
GFR calc Af Amer: 36 mL/min — ABNORMAL LOW (ref >60)
GFR calc non Af Amer: 31 mL/min — ABNORMAL LOW (ref >60)
Glucose, Bld: 123 mg/dL — ABNORMAL HIGH (ref 70–99)
Potassium: 4.3 mmol/L (ref 3.5–5.1)
Sodium: 136 mmol/L (ref 135–145)

## 2019-07-24 LAB — MRSA PCR SCREENING: MRSA by PCR: NEGATIVE

## 2019-07-24 LAB — CORTISOL-AM, BLOOD: Cortisol - AM: 8.4 ug/dL (ref 6.7–22.6)

## 2019-07-24 MED ORDER — ENOXAPARIN SODIUM 40 MG/0.4ML ~~LOC~~ SOLN
30.0000 mg | SUBCUTANEOUS | Status: DC
  Administered 2019-07-24 – 2019-07-25 (×2): 30 mg via SUBCUTANEOUS
  Filled 2019-07-24 (×2): qty 0.4

## 2019-07-24 NOTE — TOC Initial Note (Signed)
Transition of Care [facility]) - Initial/Assessment Note    Patient Details  Name: [name] MRN: [id number] Date of Birth: 1923-06-02  Transition of Care [facility]) CM/SW Contact:    Anselm Pancoast, RN Phone Number: 07/24/2019, 12:16 PM  Clinical Narrative:                 Damaris Schooner to son, Belenda Cruise, confirmed patient lives at [facility] with his wife. Family is hopeful patient will be able to return to ALF instead of SNF due to previous experience at [facility] when patient had [place] in 2020. Family understands decline in patients condition and does not seem much benefit from rehab at this time but is open to the idea if necessary. Patient had positive experience at [facility] and would prefer that if needed.   Expected Discharge Plan: Assisted Living Barriers to Discharge: Continued Medical Work up   Patient Goals and CMS Choice Patient states their goals for this hospitalization and ongoing recovery are:: potential SNF placement-lives in ALF CMS Medicare.gov Compare Post Acute Care list provided to:: Patient Represenative (must comment) Choice offered to / list presented to : Adult Children  Expected Discharge Plan and Services Expected Discharge Plan: Assisted Living     Post Acute Care Choice: [place] Living arrangements for the past 2 months: [place]                                      Prior Living Arrangements/Services Living arrangements for the past 2 months: [place] Lives with:: Facility Resident([facility]) Patient language and need for interpreter reviewed:: Yes Do you feel safe going back to the place where you live?: Yes      Need for Family Participation in Patient Care: Yes (Comment) Care giver support system in place?: Yes (comment) Current home services: DME(walker, wheelchair, Depends, Hearing aids-Bilateral) Criminal Activity/Legal Involvement Pertinent to Current Situation/Hospitalization: No  - Comment as needed  Activities of Daily Living      Permission Sought/Granted Permission sought to share information with : Facility Sport and exercise psychologist, Case Optician, dispensing granted to share information with : Yes, Verbal Permission Granted  Share Information with NAME: [facility] Department  Permission granted to share info w AGENCY: Potential SNF/[facility]        Emotional Assessment Appearance:: Appears stated age Attitude/Demeanor/Rapport: Unable to Assess Affect (typically observed): Unable to Assess Orientation: : Oriented to Self, Oriented to  Time, Oriented to Place Alcohol / Substance Use: Never Used Psych Involvement: No (comment)  Admission diagnosis:  Sepsis [facility]) [A41.9] Patient Active Problem List   Diagnosis Date Noted  . Sepsis ([place]) 07/23/2019  . Advanced care planning/counseling discussion   . Goals of care, counseling/discussion   . Pressure injury of skin 03/28/2019  . Acute on chronic diastolic heart failure ([place]) 03/27/2019  . Pneumonia due to COVID-19 virus 03/27/2019  . COVID-19 03/27/2019  . Malnutrition of moderate degree 06/15/2017  . Pneumonia 06/13/2017  . 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 S/P kyphoplasty 01/14/2017  . Respiratory failure, post-operative ([place]) 12/31/2016  . Acute respiratory failure ([place]) 12/30/2016  . Altered mental status 12/07/2016  . Acute kidney injury ([place]) 09/14/2016  . Head injury, intracranial, without loss of consciousness or fracture ([place]) 09/07/2016  . Groin pain, left 06/15/2016  . Closed right hip fracture, with routine healing, subsequent encounter 04/15/2016  .  Productive cough 03/23/2016  . Medicare annual wellness visit, subsequent 02/19/2016  . Major depression 10/14/2015  . Behavior disturbance 09/12/2015  . Mobility impaired 08/27/2015  . Skin ulcer of back ([place]) 07/01/2015  . Laceration of right hand 07/01/2015  . Traumatic hematoma of lower  back 07/01/2015  . Encounters for administrative purpose 04/01/2015  . Skin lesion of face 12/24/2014  . Contusion of right hand 11/28/2014  . Herpes zoster 10/30/2014  . Need for immunization against influenza 10/24/2014  . Compression fracture of L4 lumbar vertebra 09/28/2014  . Rectal or anal pain 08/13/2014  . Anxiety disorder 07/23/2014  . BPH without obstruction/lower urinary tract symptoms 07/23/2014  . Atherosclerosis of coronary artery 07/23/2014  . Chronic LBP 07/23/2014  . Hypercholesteremia 07/23/2014  . Benign hypertension 07/23/2014  . Acid reflux 07/23/2014  . Chronic recurrent major depressive disorder ([place]) 07/23/2014  . GERD (gastroesophageal reflux disease) 07/23/2014  . Coronary artery disease involving native coronary artery without angina pectoris 07/06/2014  . Essential hypertension 07/06/2014  . Carotid stenosis 07/06/2014  . Hyperlipidemia   . H/O [name] 12/20/2011   PCP:  Odessa Fleming, NP Pharmacy:   [facility], [place] [address] [place] [address] Phone: [phone] Fax: [phone]     Social Determinants of Health (SDOH) Interventions    Readmission Risk Interventions Readmission Risk Prevention Plan 03/28/2019  Transportation Screening Complete  PCP or Specialist Appt within 3-5 Days Complete  HRI or [place] (No Data)  Social Work Consult for [place] Planning/Counseling (No Data)  Palliative Care Screening Not Applicable  Medication Review Press photographer) Complete  Some recent data might be hidden

## 2019-07-24 NOTE — ED Notes (Signed)
Pt is dry. Sleeping, oxygen remains in place.

## 2019-07-24 NOTE — ED Notes (Signed)
Report to bill, rn.  

## 2019-07-24 NOTE — TOC Initial Note (Signed)
Transition of Care [facility]) - Initial/Assessment Note    Patient Details  Name: HUBERT RAATZ MRN: [id number] Date of Birth: 12-18-1923  Transition of Care [facility]) CM/SW Contact:    Anselm Pancoast, RN Phone Number: 07/24/2019, 11:45 AM  Clinical Narrative:                 LVMM for son, Belenda Cruise [phone] requesting callback to discuss dc plan/baseline. Patient is from [facility].         Patient Goals and CMS Choice        Expected Discharge Plan and Services                                                Prior Living Arrangements/Services                       Activities of Daily Living      Permission Sought/Granted                  Emotional Assessment              Admission diagnosis:  Sepsis [facility]) [A41.9] Patient Active Problem List   Diagnosis Date Noted  . Sepsis ([place]) 07/23/2019  . Advanced care planning/counseling discussion   . Goals of care, counseling/discussion   . Pressure injury of skin 03/28/2019  . Acute on chronic diastolic heart failure ([place]) 03/27/2019  . Pneumonia due to COVID-19 virus 03/27/2019  . COVID-19 03/27/2019  . Malnutrition of moderate degree 06/15/2017  . Pneumonia 06/13/2017  . 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 S/P kyphoplasty 01/14/2017  . Respiratory failure, post-operative ([place]) 12/31/2016  . Acute respiratory failure ([place]) 12/30/2016  . Altered mental status 12/07/2016  . Acute kidney injury ([place]) 09/14/2016  . Head injury, intracranial, without loss of consciousness or fracture ([place]) 09/07/2016  . Groin pain, left 06/15/2016  . Closed right hip fracture, with routine healing, subsequent encounter 04/15/2016  . Productive cough 03/23/2016  . Medicare annual wellness visit, subsequent 02/19/2016  . Major depression 10/14/2015  . Behavior disturbance 09/12/2015  . Mobility impaired 08/27/2015  . Skin ulcer of back ([place]) 07/01/2015  .  Laceration of right hand 07/01/2015  . Traumatic hematoma of lower back 07/01/2015  . Encounters for administrative purpose 04/01/2015  . Skin lesion of face 12/24/2014  . Contusion of right hand 11/28/2014  . Herpes zoster 10/30/2014  . Need for immunization against influenza 10/24/2014  . Compression fracture of L4 lumbar vertebra 09/28/2014  . Rectal or anal pain 08/13/2014  . Anxiety disorder 07/23/2014  . BPH without obstruction/lower urinary tract symptoms 07/23/2014  . Atherosclerosis of coronary artery 07/23/2014  . Chronic LBP 07/23/2014  . Hypercholesteremia 07/23/2014  . Benign hypertension 07/23/2014  . Acid reflux 07/23/2014  . Chronic recurrent major depressive disorder ([place]) 07/23/2014  . GERD (gastroesophageal reflux disease) 07/23/2014  . Coronary artery disease involving native coronary artery without angina pectoris 07/06/2014  . Essential hypertension 07/06/2014  . Carotid stenosis 07/06/2014  . Hyperlipidemia   . H/O [name] 12/20/2011   PCP:  Odessa Fleming, NP Pharmacy:   [facility], [place] [address] [place] [address] Phone: [phone] Fax: [phone]     Social Determinants of Health (SDOH) Interventions  Readmission Risk Interventions Readmission Risk Prevention Plan 03/28/2019  Transportation Screening Complete  PCP or Specialist Appt within 3-5 Days Complete  HRI or [place] (No Data)  Social Work Consult for [place] Planning/Counseling (No Data)  Palliative Care Screening Not Applicable  Medication Review Press photographer) Complete  Some recent data might be hidden

## 2019-07-24 NOTE — ED Notes (Signed)
Pt unable to use urinal. Pt brief, pad, gown and sheet soiled with urine and feces. Pt cleaned and full linen change.

## 2019-07-24 NOTE — Progress Notes (Signed)
PROGRESS NOTE    [name]  PPI:[id number] DOB: May 14, 1923 DOA: 07/23/2019 PCP: Odessa Fleming, NP   Brief Narrative:  HPI per Dr. Eugenie Norrie on 07/23/19 [name]  is a 86 y.o. Caucasian male with a known history of coronary artery disease, CHF, hypertension, dyslipidemia, GERD and lymphoma, presented to the emergency room with acute onset of altered mental status and fall without injuries.  According to the patient's wife he has been more confused than his normal and feeling generally weak.  He was noted to have a fever 101.4 in the ER in his pulse currently was down to 88% on room air.  Is a fairly poor historian due to altered mental status as well as hearing loss.  He has been having cough with inability to expectorate.  He has chronic low back pain.  No chest pain or palpitations.  No headache or dizziness or blurred vision.  No paresthesias or focal muscle weakness.  Upon presentation to the emergency room, temperature was 101.4 and blood pressure was 141/68 with otherwise normal vital signs.  Approximately came up to 97% on 2 L of O2 by nasal cannula.  Labs revealed mild hyponatremia, BUN of 35 and creatinine 1.73 above previous levels that were consistent with stage IIIa chronic kidney disease with a BNP of 130.  High-sensitivity troponin I was 7 letter 6.  Lactic acid was 1.6.  CBC showed significant leukocytosis of 19.5 with neutrophilia as well as anemia close to baseline.  UA was positive for UTI.  Urine culture was sent.  Blood cultures were sent as well.  Head and neck CT scan revealed no acute intracranial abnormalities and remote appearing lacunar 1 in the left cerebellum, chronic volume loss and microvascular angiopathy and advanced multilevel cervical spondylitic changes with mild canal stenosis at C3-4 and moderate stenosis at C5-6 and C6-7 with more moderate to severe stenosis at C4-5 with mild to moderate foraminal narrowing and more severe narrowing bilaterally at C3-4 and C4-5.  It  showed prior right mastoidectomy and intracranial and cervical atherosclerosis.  The patient was given IV Rocephin and Zithromax and 1 g of p.o. Tylenol as well as hydration with IV normal saline.  He will be admitted to a progressive unit bed for further evaluation and management.  **Interim History  He remained confused and is extremely hard of hearing.  Sepsis physiology is slightly improved but his WBC is gone up.  We will continue empiric antibiotics and continue with diuresis and repeat chest x-ray in a.m. we will consult palliative care given his high risk of mortality  Assessment & Plan:   Active Problems:   Sepsis ([place])  Sepsis secondary to UTI and community-acquired pneumonia with subsequent metabolic encephalopathy.  -No evidence for severe sepsis or septic shock.  Sepsis is based on his fever, tachypnea and leukocytosis. -Sepsis physiology is improving -Head CT scan done given his fall; see below -The patient will be admitted to a progressive unit bed. -He will be placed on IV Rocephin and Zithromax. -WBC went from 19.5 is now 22.3  -Lactic acid levels 1.6 and Procalcitonin levels 1.61 -Urinalysis showed cloudy appearance with yellow-colored urine, large leukocytes, negative nitrites, many bacteria, 0-5 RBCs present per high-power field, greater than 50 WBCs with cultures pending -Mucolytic therapy be provided as well as duo nebs. -We will follow sputum culture as well as blood cultures  -CXR showed "Increasing reticular opacities with indistinct vascularity may reflect developing edema though more patchy opacity in the right lung  base could reflect infection as well in the appropriate clinical setting. Chronic, loculated left pleural effusion which extends over the lung apex. Adjacent opacity in the lung likely reflecting some atelectatic change though underlying consolidation is difficult to Exclude. Nonunited fracture of the right midclavicle. Stable alignment from priors.   Remote bilateral rib fractures. Remote sternal fracture. Features of DISH." -Repeat CXR in the AM  Fall Metabolic Encephalopathy -Head and Neck CT done and showed "No evidence of acute intracranial abnormality. Remote appearing lacune in the left cerebellum. Chronic volume loss and microvascular angiopathy.  No evidence of acute fracture or traumatic listhesis of the cervical spine. Advanced multilevel cervical spondylitic changes with mild canal stenoses at C3-4. Moderate stenoses at C5-6, C6-7 and more moderate to severe stenosis C4-5. There is multilevel mild-to-moderate foraminal narrowing as well with more severe narrowing bilaterally at C3-4 and C4-5. Prior right mastoidectomy. Intracranial and cervical atherosclerosis." -Fall Precautions  Acute on Chronic Diastolic CHF/HFpEF. -Last echo was in 2019 and revealed grade 1 diastolic dysfunction with EF of 60 to 65%.  Repeat echo as below -BNP is 130.4 -The patient will be diuresed with IV Lasix and will follow serial troponin I's usual troponin was 7 and repeat was 6.  Continue IV Lasix 40 mg daily -We will obtain a 2D echo in a.m. -This could be cor pulmonale secondary to his pneumonia. -Strict I's and O's and Daily Weights  -Repeat CXR in the AM   Anxiety and Depression. -Lexapro and Klonopin will be continued.  Peripheral Neuropathy. -We will continue Neurontin.  GERD. -We will continue PPI therapy with pantoprazole 40 mg p.o. daily.   Dyslipidemia. -We will continue statin therapy with pravastatin 40 mg p.o. nightly  Thrombocytopenia -In the setting of his sepsis picture -Patient's platelet count from 161 is now 148  -Continue to monitor for signs and symptoms of bleeding; currently no overt bleeding noted For repeat CBC in a.m.  Anxiety and depression -Continue with clonazepam 1 mg p.o. 3 times daily as needed for anxiety and continue with escitalopram 20 mg p.o. daily  [name] -He is status post cath and PCI with LAD  in year of Prometheus DES placed back in 2015 -Continue with metoprolol tartrate 12.5 mg p.o. twice daily, statin as above, as well as nitroglycerin  DVT prophylaxis: Enoxaparin 30 mg subcu every 24 Code Status: DO NOT RESUSCITATE  Family Communication: No family present at bedside Disposition Plan: Pending further work-up and improvement of his sepsis and return to baseline   Status is: Inpatient  Remains inpatient appropriate because:Unsafe d/c plan, IV treatments appropriate due to intensity of illness or inability to take PO and Inpatient level of care appropriate due to severity of illness   Dispo: The patient is from: Home              Anticipated d/c is to: TBD              Anticipated d/c date is: 2 days              Patient currently is not medically stable to d/c.   Consultants:   None   Procedures:  ECHOCARDIOGRAM IMPRESSIONS    1. Left ventricular ejection fraction, by estimation, is 55 to 60%. The  left ventricle has normal function. Left ventricular endocardial border  not optimally defined to evaluate regional wall motion. Left ventricular  diastolic parameters are  indeterminate.  2. Right ventricular systolic function is normal. The right ventricular  size is normal. Tricuspid  regurgitation signal is inadequate for assessing  PA pressure.  3. The mitral valve is normal in structure. No evidence of mitral valve  regurgitation. No evidence of mitral stenosis.  4. The aortic valve is normal in structure. Aortic valve regurgitation is  not visualized. Mild aortic valve sclerosis is present, with no evidence  of aortic valve stenosis.  5. Technically difficult study due to poor echo windows.   FINDINGS  Left Ventricle: Left ventricular ejection fraction, by estimation, is 55  to 60%. The left ventricle has normal function. Left ventricular  endocardial border not optimally defined to evaluate regional wall motion.  The left ventricular internal cavity   size was normal in size. There is no left ventricular hypertrophy. Left  ventricular diastolic parameters are indeterminate.   Right Ventricle: The right ventricular size is normal. No increase in  right ventricular wall thickness. Right ventricular systolic function is  normal. Tricuspid regurgitation signal is inadequate for assessing PA  pressure.   Left Atrium: Left atrial size was normal in size.   Right Atrium: Right atrial size was normal in size.   Pericardium: There is no evidence of pericardial effusion.   Mitral Valve: The mitral valve is normal in structure. Normal mobility of  the mitral valve leaflets. No evidence of mitral valve regurgitation. No  evidence of mitral valve stenosis. MV peak gradient, 7.0 mmHg. The mean  mitral valve gradient is 4.0 mmHg.   Tricuspid Valve: The tricuspid valve is normal in structure. Tricuspid  valve regurgitation is not demonstrated. No evidence of tricuspid  stenosis.   Aortic Valve: The aortic valve is normal in structure. Aortic valve  regurgitation is not visualized. Mild aortic valve sclerosis is present,  with no evidence of aortic valve stenosis. Aortic valve mean gradient  measures 4.0 mmHg. Aortic valve peak  gradient measures 6.7 mmHg. Aortic valve area, by VTI measures 2.58 cm.   Pulmonic Valve: The pulmonic valve was normal in structure. Pulmonic valve  regurgitation is not visualized. No evidence of pulmonic stenosis.   Aorta: The aortic root is normal in size and structure.   Venous: The inferior vena cava was not well visualized.   IAS/Shunts: No atrial level shunt detected by color flow Doppler.     LEFT VENTRICLE  PLAX 2D  LVIDd:     3.29 cm Diastology  LVIDs:     2.81 cm LV e' lateral:  7.07 cm/s  LV PW:     0.95 cm LV E/e' lateral: 15.7  LV IVS:    0.93 cm LV e' medial:  8.59 cm/s  LVOT diam:   2.30 cm LV E/e' medial: 12.9  LV SV:     60  LV SV Index:  28  LVOT Area:    4.15 cm     LEFT ATRIUM     Index  LA diam:  3.00 cm 1.42 cm/m  AORTIC VALVE          PULMONIC VALVE  AV Area (Vmax):  2.59 cm  PV Vmax:    0.85 m/s  AV Area (Vmean):  2.25 cm  PV Vmean:   58.000 cm/s  AV Area (VTI):   2.58 cm  PV VTI:    0.157 m  AV Vmax:      129.00 cm/s PV Peak grad: 2.9 mmHg  AV Vmean:     97.100 cm/s PV Mean grad: 2.0 mmHg  AV VTI:      0.232 m  AV Peak Grad:   6.7 mmHg  AV Mean Grad:   4.0 mmHg  LVOT Vmax:     80.30 cm/s  LVOT Vmean:    52.500 cm/s  LVOT VTI:     0.144 m  LVOT/AV VTI ratio: 0.62    AORTA  Ao Root diam: 3.60 cm   MITRAL VALVE  MV Area (PHT): 5.58 cm   SHUNTS  MV Peak grad: 7.0 mmHg   Systemic VTI: 0.14 m  MV Mean grad: 4.0 mmHg   Systemic Diam: 2.30 cm  MV Vmax:    1.32 m/s  MV Vmean:   91.2 cm/s  MV Decel Time: 136 msec  MV E velocity: 111.00 cm/s  MV A velocity: 109.00 cm/s  MV E/A ratio: 1.02    Antimicrobials:  Anti-infectives (From admission, onward)   Start     Dose/Rate Route Frequency Ordered Stop   07/24/19 2200  azithromycin (ZITHROMAX) 500 mg in sodium chloride 0.9 % 250 mL IVPB     500 mg 250 mL/hr over 60 Minutes Intravenous Every 24 hours 07/23/19 2259     07/24/19 0800  cefTRIAXone (ROCEPHIN) 2 g in sodium chloride 0.9 % 100 mL IVPB     2 g 200 mL/hr over 30 Minutes Intravenous Every 24 hours 07/23/19 2259     07/23/19 2030  cefTRIAXone (ROCEPHIN) 1 g in sodium chloride 0.9 % 100 mL IVPB     1 g 200 mL/hr over 30 Minutes Intravenous  Once 07/23/19 2029 07/23/19 2128   07/23/19 2030  azithromycin (ZITHROMAX) 500 mg in sodium chloride 0.9 % 250 mL IVPB     500 mg 250 mL/hr over 60 Minutes Intravenous  Once 07/23/19 2029 07/23/19 2259   07/23/19 2015  ceFEPIme (MAXIPIME) 1 g in sodium chloride 0.9 % 100 mL IVPB  Status:  Discontinued     1 g 200 mL/hr over 30 Minutes Intravenous  Once 07/23/19 2007 07/23/19 2029       Subjective: Seen and examined at bedside and is extremely hard of hearing.  Sitting up in the ED with no real complaints but does not know how he got here but thinks he fell.  No other concerns or complaints at this time.  Objective: Vitals:   07/24/19 1203 07/24/19 1509 07/24/19 1630 07/24/19 1700  BP: (!) 163/72 (!) 152/67 127/63 (!) 148/80  Pulse: 88 87 80 88  Resp: 18 18  20   Temp:      TempSrc:      SpO2: 94% 94% 92% 94%  Weight:      Height:        Intake/Output Summary (Last 24 hours) at 07/24/2019 1810 Last data filed at 07/23/2019 2354 Gross per 24 hour  Intake 1350 ml  Output 400 ml  Net 950 ml   Filed Weights   07/23/19 1918  Weight: 94 kg   Examination: Physical Exam:  Constitutional: WN/WD elderly overweight Caucasian male currently in, NAD and appears uncomfortable Eyes: Lids normal but he does have some discharge in his eyes bilaterally. ENMT: External Ears, Nose appear normal. Very hard of hearing.  Neck: Appears normal, supple, no cervical masses, normal ROM, no appreciable thyromegaly; Respiratory: Diminished to auscultation bilaterally with coarse breath sounds, and some crackles but no appreciable wheezing.. Normal respiratory effort and patient is not tachypenic. No accessory muscle use.  Wearing supplemental oxygen via nasal cannula Cardiovascular: RRR, no murmurs / rubs / gallops. S1 and S2 auscultated.  Has 1+ lower extremity edema Abdomen: Soft, non-tender, distended secondary body habitus. Bowel sounds positive.  GU: Deferred. Musculoskeletal: No clubbing / cyanosis of digits/nails. No joint deformity upper and lower extremities.   Skin: No rashes, lesions, ulcers on limited skin evaluation. No induration; Warm and dry.  Neurologic: CN 2-12 grossly intact with no focal deficits. Romberg sign and cerebellar reflexes not assessed.  Psychiatric: impaired judgment and insight. Alert and oriented x 1. Normal mood and appropriate affect.   Data Reviewed:  I have personally reviewed following labs and imaging studies  CBC: Recent Labs  Lab 07/23/19 1923 07/24/19 0313  WBC 19.5* 22.3*  NEUTROABS 17.0*  --   HGB 11.3* 10.2*  HCT 35.1* 30.7*  MCV 89.8 87.7  PLT 161 867*   Basic Metabolic Panel: Recent Labs  Lab 07/23/19 1923 07/24/19 0313  NA 134* 136  K 4.7 4.3  CL 101 102  CO2 23 28  GLUCOSE 101* 123*  BUN 35* 36*  CREATININE 1.73* 1.82*  CALCIUM 9.5 9.1   GFR: Estimated Creatinine Clearance: 29.2 mL/min (A) (by C-G formula based on SCr of 1.82 mg/dL (H)). Liver Function Tests: Recent Labs  Lab 07/23/19 1923  AST 23  ALT 17  ALKPHOS 99  BILITOT 0.7  PROT 8.0  ALBUMIN 3.6   No results for input(s): LIPASE, AMYLASE in the last 168 hours. No results for input(s): AMMONIA in the last 168 hours. Coagulation Profile: No results for input(s): INR, PROTIME in the last 168 hours. Cardiac Enzymes: No results for input(s): CKTOTAL, CKMB, CKMBINDEX, TROPONINI in the last 168 hours. BNP (last 3 results) No results for input(s): PROBNP in the last 8760 hours. HbA1C: No results for input(s): HGBA1C in the last 72 hours. CBG: No results for input(s): GLUCAP in the last 168 hours. Lipid Profile: No results for input(s): CHOL, HDL, LDLCALC, TRIG, CHOLHDL, LDLDIRECT in the last 72 hours. Thyroid Function Tests: No results for input(s): TSH, T4TOTAL, FREET4, T3FREE, THYROIDAB in the last 72 hours. Anemia Panel: No results for input(s): VITAMINB12, FOLATE, FERRITIN, TIBC, IRON, RETICCTPCT in the last 72 hours. Sepsis Labs: Recent Labs  Lab 07/23/19 1923 07/24/19 0313  PROCALCITON  --  1.61  LATICACIDVEN 1.6  --     Recent Results (from the past 240 hour(s))  Culture, blood (routine x 2)     Status: None (Preliminary result)   Collection Time: 07/23/19  7:23 PM   Specimen: BLOOD  Result Value Ref Range Status   Specimen Description BLOOD RIGHT ANTECUBITAL  Final   Special Requests   Final    BOTTLES DRAWN AEROBIC AND  ANAEROBIC Blood Culture results may not be optimal due to an excessive volume of blood received in culture bottles   Culture   Final    NO GROWTH < 12 HOURS Performed at [facility], [address] [place], [place] [address]    Report Status PENDING  Incomplete  Culture, blood (routine x 2)     Status: None (Preliminary result)   Collection Time: 07/23/19  8:22 PM   Specimen: BLOOD  Result Value Ref Range Status   Specimen Description BLOOD BLOOD LEFT HAND  Final   Special Requests   Final    BOTTLES DRAWN AEROBIC AND ANAEROBIC Blood Culture results may not be optimal due to an excessive volume of blood received in culture bottles   Culture   Final    NO GROWTH < 12 HOURS Performed at [facility], [address], [place], [place] [address]    Report Status PENDING  Incomplete  SARS Coronavirus 2 by RT  PCR (hospital order, performed in [facility] hospital lab) Nasopharyngeal Nasopharyngeal Swab     Status: None   Collection Time: 07/23/19  8:22 PM   Specimen: Nasopharyngeal Swab  Result Value Ref Range Status   SARS Coronavirus 2 NEGATIVE NEGATIVE Final    Comment: (NOTE) SARS-CoV-2 target nucleic acids are NOT DETECTED. The SARS-CoV-2 RNA is generally detectable in upper and lower respiratory specimens during the acute phase of infection. The lowest concentration of SARS-CoV-2 viral copies this assay can detect is 250 copies / mL. A negative result does not preclude SARS-CoV-2 infection and should not be used as the sole basis for treatment or other patient management decisions.  A negative result may occur with improper specimen collection / handling, submission of specimen other than nasopharyngeal swab, presence of viral mutation(s) within the areas targeted by this assay, and inadequate number of viral copies (<250 copies / mL). A negative result must be combined with clinical observations, patient history, and epidemiological information. Fact Sheet  for Patients:   StrictlyIdeas.no Fact Sheet for Healthcare Providers: BankingDealers.co.za This test is not yet approved or cleared  by the [place] FDA and has been authorized for detection and/or diagnosis of SARS-CoV-2 by FDA under an Emergency Use Authorization (EUA).  This EUA will remain in effect (meaning this test can be used) for the duration of the COVID-19 declaration under Section 564(b)(1) of the Act, 21 U.S.C. section 360bbb-3(b)(1), unless the authorization is terminated or revoked sooner. Performed at [facility], [address] [place], [place] [address]     RN Pressure Injury Documentation:     Estimated body mass index is 29.73 kg/m as calculated from the following:   Height as of this encounter: 5\' 10"  (1.778 m).   Weight as of this encounter: 94 kg.  Malnutrition Type:      Malnutrition Characteristics:      Nutrition Interventions:    Radiology Studies: DG Chest 2 View  Result Date: 07/23/2019 CLINICAL DATA:  Fall, low-grade temperature EXAM: CHEST - 2 VIEW COMPARISON:  Radiograph 03/27/2019 CT 02/19/2016 FINDINGS: Redemonstration of the chronic, loculated left pleural effusion which extends over the lung apex. Adjacent opacity in the lung likely reflecting some atelectatic change though underlying consolidation is difficult to exclude. Increasing reticular and patchy opacity in the right lung base with indistinct pulmonary vascularity as well as fissural and septal thickening. Cardiomediastinal silhouette is partially obscured by left basilar opacity though overall appears similar to the comparison with a calcified, tortuous aorta. Redemonstration of the midthoracic compression deformity (likely T10) with vertebroplasty changes. Multilevel flowing anterior osteophytosis, compatible with features of diffuse idiopathic skeletal hyperostosis (DISH). Nonunited midshaft right clavicular fracture is  in similar of alignment to comparison from February. Chronic deformity of the sternum is also noted. Few subacute to chronic appearing rib deformities of the left third rib and right second rib. No acute rib fracture. IMPRESSION: 1. Increasing reticular opacities with indistinct vascularity may reflect developing edema though more patchy opacity in the right lung base could reflect infection as well in the appropriate clinical setting. 2. Chronic, loculated left pleural effusion which extends over the lung apex. Adjacent opacity in the lung likely reflecting some atelectatic change though underlying consolidation is difficult to exclude. 3. Nonunited fracture of the right midclavicle. Stable alignment from priors. 4. Remote bilateral rib fractures. 5. Remote sternal fracture. 6. Features of DISH. Electronically Signed   By: Lovena Le M.D.   On: 07/23/2019 19:24   CT Head Wo  Contrast  Result Date: 07/23/2019 CLINICAL DATA:  Fall at 14:30 with increasing confusion EXAM: CT HEAD WITHOUT CONTRAST CT CERVICAL SPINE WITHOUT CONTRAST TECHNIQUE: Multidetector CT imaging of the head and cervical spine was performed following the standard protocol without intravenous contrast. Multiplanar CT image reconstructions of the cervical spine were also generated. COMPARISON:  CT 09/29/2018, 03/27/2019 FINDINGS: CT HEAD FINDINGS Brain: Remote appearing lacune again seen in the left cerebellum. No evidence of acute infarction, hemorrhage, hydrocephalus, extra-axial collection or mass lesion/mass effect. Symmetric prominence of the ventricles, cisterns and sulci compatible with parenchymal volume loss. Patchy areas of white matter hypoattenuation are most compatible with chronic microvascular angiopathy. Vascular: Atherosclerotic calcification of the carotid siphons and intradural vertebral arteries. No hyperdense vessel. Skull: Chronic scarring seen towards the left parietal vertex. No acute focal soft tissue swelling, hematoma,  or contusive change. No calvarial fracture or suspicious osseous lesion. Sinuses/Orbits: Mild mural disease in the ethmoid sinuses. Remaining paranasal sinuses and mastoids are predominantly clear with postsurgical changes from prior wall down right mastoidectomy. Slight hypo pneumatization of the left mastoids. Orbital structures are unremarkable aside from prior lens extractions. Other: Chronic deformity of the nasal bones. CT CERVICAL SPINE FINDINGS Alignment: Stabilization collar absent at the time of examination. Mild leftward cranial rotation. No evidence of traumatic listhesis. No abnormally widened, perched or jumped facets. Normal alignment of the craniocervical and atlantoaxial articulations. Skull base and vertebrae: Advanced arthrosis at the atlantoaxial and basion-dens intervals with hypertrophic change and degenerative spurring. Additional multilevel cervical spondylitic changes and osteophyte formations. Bony fusion of the C5-C7 levels involving the vertebral bodies and posterior elements C5-C7 on the left and the posterior elements on the right at C5-6. No acute skull base fracture, vertebral body fracture or height loss is seen. Evaluation of the lower lumbar levels mildly limited due to a combination of motion artifact and photon starvation. Soft tissues and spinal canal: No pre or paravertebral fluid or swelling. No visible canal hematoma. Disc levels: Multilevel intervertebral disc height loss with spondylitic endplate changes, facet hypertrophic changes, and uncinate spurring. Findings result in some mild canal stenoses at C3-4. Moderate stenoses at C5-6, C6-7 and more moderate to severe stenosis C4-5. There is multilevel mild-to-moderate foraminal narrowing as well with more severe narrowing bilaterally at C3-4 and C4-5. Upper chest: No acute abnormality in the upper chest or imaged lung apices. Other: Calcification of the proximal great vessels as well as the cervical carotid arteries. Normal  thyroid. IMPRESSION: 1. No evidence of acute intracranial abnormality. 2. Remote appearing lacune in the left cerebellum. 3. Chronic volume loss and microvascular angiopathy. 4. No evidence of acute fracture or traumatic listhesis of the cervical spine. 5. Advanced multilevel cervical spondylitic changes with mild canal stenoses at C3-4. Moderate stenoses at C5-6, C6-7 and more moderate to severe stenosis C4-5. There is multilevel mild-to-moderate foraminal narrowing as well with more severe narrowing bilaterally at C3-4 and C4-5. 6. Prior right mastoidectomy. 7. Intracranial and cervical atherosclerosis. Electronically Signed   By: Lovena Le M.D.   On: 07/23/2019 19:18   CT Cervical Spine Wo Contrast  Result Date: 07/23/2019 CLINICAL DATA:  Fall at 14:30 with increasing confusion EXAM: CT HEAD WITHOUT CONTRAST CT CERVICAL SPINE WITHOUT CONTRAST TECHNIQUE: Multidetector CT imaging of the head and cervical spine was performed following the standard protocol without intravenous contrast. Multiplanar CT image reconstructions of the cervical spine were also generated. COMPARISON:  CT 09/29/2018, 03/27/2019 FINDINGS: CT HEAD FINDINGS Brain: Remote appearing lacune again seen in the left  cerebellum. No evidence of acute infarction, hemorrhage, hydrocephalus, extra-axial collection or mass lesion/mass effect. Symmetric prominence of the ventricles, cisterns and sulci compatible with parenchymal volume loss. Patchy areas of white matter hypoattenuation are most compatible with chronic microvascular angiopathy. Vascular: Atherosclerotic calcification of the carotid siphons and intradural vertebral arteries. No hyperdense vessel. Skull: Chronic scarring seen towards the left parietal vertex. No acute focal soft tissue swelling, hematoma, or contusive change. No calvarial fracture or suspicious osseous lesion. Sinuses/Orbits: Mild mural disease in the ethmoid sinuses. Remaining paranasal sinuses and mastoids are  predominantly clear with postsurgical changes from prior wall down right mastoidectomy. Slight hypo pneumatization of the left mastoids. Orbital structures are unremarkable aside from prior lens extractions. Other: Chronic deformity of the nasal bones. CT CERVICAL SPINE FINDINGS Alignment: Stabilization collar absent at the time of examination. Mild leftward cranial rotation. No evidence of traumatic listhesis. No abnormally widened, perched or jumped facets. Normal alignment of the craniocervical and atlantoaxial articulations. Skull base and vertebrae: Advanced arthrosis at the atlantoaxial and basion-dens intervals with hypertrophic change and degenerative spurring. Additional multilevel cervical spondylitic changes and osteophyte formations. Bony fusion of the C5-C7 levels involving the vertebral bodies and posterior elements C5-C7 on the left and the posterior elements on the right at C5-6. No acute skull base fracture, vertebral body fracture or height loss is seen. Evaluation of the lower lumbar levels mildly limited due to a combination of motion artifact and photon starvation. Soft tissues and spinal canal: No pre or paravertebral fluid or swelling. No visible canal hematoma. Disc levels: Multilevel intervertebral disc height loss with spondylitic endplate changes, facet hypertrophic changes, and uncinate spurring. Findings result in some mild canal stenoses at C3-4. Moderate stenoses at C5-6, C6-7 and more moderate to severe stenosis C4-5. There is multilevel mild-to-moderate foraminal narrowing as well with more severe narrowing bilaterally at C3-4 and C4-5. Upper chest: No acute abnormality in the upper chest or imaged lung apices. Other: Calcification of the proximal great vessels as well as the cervical carotid arteries. Normal thyroid. IMPRESSION: 1. No evidence of acute intracranial abnormality. 2. Remote appearing lacune in the left cerebellum. 3. Chronic volume loss and microvascular angiopathy. 4.  No evidence of acute fracture or traumatic listhesis of the cervical spine. 5. Advanced multilevel cervical spondylitic changes with mild canal stenoses at C3-4. Moderate stenoses at C5-6, C6-7 and more moderate to severe stenosis C4-5. There is multilevel mild-to-moderate foraminal narrowing as well with more severe narrowing bilaterally at C3-4 and C4-5. 6. Prior right mastoidectomy. 7. Intracranial and cervical atherosclerosis. Electronically Signed   By: Lovena Le M.D.   On: 07/23/2019 19:18   ECHOCARDIOGRAM COMPLETE  Result Date: 07/24/2019    ECHOCARDIOGRAM REPORT   Patient Name:   [name] Date of Exam: 07/24/2019 Medical Rec #:  [id number]    Height:       70.0 in Accession #:    [id number]   Weight:       207.2 lb Date of Birth:  17-Dec-1923     BSA:          2.119 m Patient Age:    79 years     BP:           166/77 mmHg Patient Gender: M            HR:           101 bpm. Exam Location:  [facility] Procedure: 2D Echo, Color Doppler and Cardiac Doppler Indications:     I50.31  CHF-Acute Diastolic  History:         Patient has prior history of Echocardiogram examinations.                  HFpEF, [name]; Risk Factors:Sleep Apnea, Hypertension and                  Dyslipidemia.  Sonographer:     Charmayne Sheer RDCS (AE) Referring Phys:  [id number] [place] Diagnosing Phys: Kathlyn Sacramento MD  Sonographer Comments: Technically difficult study due to poor echo windows. Image acquisition challenging due to patient body habitus. IMPRESSIONS  1. Left ventricular ejection fraction, by estimation, is 55 to 60%. The left ventricle has normal function. Left ventricular endocardial border not optimally defined to evaluate regional wall motion. Left ventricular diastolic parameters are indeterminate.  2. Right ventricular systolic function is normal. The right ventricular size is normal. Tricuspid regurgitation signal is inadequate for assessing PA pressure.  3. The mitral valve is normal in structure. No evidence of mitral valve  regurgitation. No evidence of mitral stenosis.  4. The aortic valve is normal in structure. Aortic valve regurgitation is not visualized. Mild aortic valve sclerosis is present, with no evidence of aortic valve stenosis.  5. Technically difficult study due to poor echo windows. FINDINGS  Left Ventricle: Left ventricular ejection fraction, by estimation, is 55 to 60%. The left ventricle has normal function. Left ventricular endocardial border not optimally defined to evaluate regional wall motion. The left ventricular internal cavity size was normal in size. There is no left ventricular hypertrophy. Left ventricular diastolic parameters are indeterminate. Right Ventricle: The right ventricular size is normal. No increase in right ventricular wall thickness. Right ventricular systolic function is normal. Tricuspid regurgitation signal is inadequate for assessing PA pressure. Left Atrium: Left atrial size was normal in size. Right Atrium: Right atrial size was normal in size. Pericardium: There is no evidence of pericardial effusion. Mitral Valve: The mitral valve is normal in structure. Normal mobility of the mitral valve leaflets. No evidence of mitral valve regurgitation. No evidence of mitral valve stenosis. MV peak gradient, 7.0 mmHg. The mean mitral valve gradient is 4.0 mmHg. Tricuspid Valve: The tricuspid valve is normal in structure. Tricuspid valve regurgitation is not demonstrated. No evidence of tricuspid stenosis. Aortic Valve: The aortic valve is normal in structure. Aortic valve regurgitation is not visualized. Mild aortic valve sclerosis is present, with no evidence of aortic valve stenosis. Aortic valve mean gradient measures 4.0 mmHg. Aortic valve peak gradient measures 6.7 mmHg. Aortic valve area, by VTI measures 2.58 cm. Pulmonic Valve: The pulmonic valve was normal in structure. Pulmonic valve regurgitation is not visualized. No evidence of pulmonic stenosis. Aorta: The aortic root is normal in  size and structure. Venous: The inferior vena cava was not well visualized. IAS/Shunts: No atrial level shunt detected by color flow Doppler.  LEFT VENTRICLE PLAX 2D LVIDd:         3.29 cm  Diastology LVIDs:         2.81 cm  LV e' lateral:   7.07 cm/s LV PW:         0.95 cm  LV E/e' lateral: 15.7 LV IVS:        0.93 cm  LV e' medial:    8.59 cm/s LVOT diam:     2.30 cm  LV E/e' medial:  12.9 LV SV:         60 LV SV Index:   28 LVOT Area:  4.15 cm  LEFT ATRIUM         Index LA diam:    3.00 cm 1.42 cm/m  AORTIC VALVE                   PULMONIC VALVE AV Area (Vmax):    2.59 cm    PV Vmax:       0.85 m/s AV Area (Vmean):   2.25 cm    PV Vmean:      58.000 cm/s AV Area (VTI):     2.58 cm    PV VTI:        0.157 m AV Vmax:           129.00 cm/s PV Peak grad:  2.9 mmHg AV Vmean:          97.100 cm/s PV Mean grad:  2.0 mmHg AV VTI:            0.232 m AV Peak Grad:      6.7 mmHg AV Mean Grad:      4.0 mmHg LVOT Vmax:         80.30 cm/s LVOT Vmean:        52.500 cm/s LVOT VTI:          0.144 m LVOT/AV VTI ratio: 0.62  AORTA Ao Root diam: 3.60 cm MITRAL VALVE MV Area (PHT): 5.58 cm     SHUNTS MV Peak grad:  7.0 mmHg     Systemic VTI:  0.14 m MV Mean grad:  4.0 mmHg     Systemic Diam: 2.30 cm MV Vmax:       1.32 m/s MV Vmean:      91.2 cm/s MV Decel Time: 136 msec MV E velocity: 111.00 cm/s MV A velocity: 109.00 cm/s MV E/A ratio:  1.02 Kathlyn Sacramento MD Electronically signed by Kathlyn Sacramento MD Signature Date/Time: 07/24/2019/4:50:19 PM    Final    Scheduled Meds: . acidophilus  1 capsule Oral Daily  . albuterol  2.5 mg Inhalation Q6H  . ascorbic acid  500 mg Oral Daily  . cholecalciferol  5,000 Units Oral Daily  . docusate sodium  100 mg Oral BID  . enoxaparin (LOVENOX) injection  30 mg Subcutaneous Q24H  . escitalopram  20 mg Oral Daily  . furosemide  40 mg Intravenous Daily  . gabapentin  300 mg Oral QHS  . lidocaine  1 patch Transdermal Daily  . lidocaine  5 mL Mouth/Throat TID  . metoprolol tartrate   12.5 mg Oral BID  . multivitamin with minerals  1 tablet Oral Daily  . pantoprazole  40 mg Oral Daily  . pravastatin  40 mg Oral QHS  . senna  1 tablet Oral QHS  . sodium chloride flush  3 mL Intravenous Q12H  . zinc sulfate  220 mg Oral Daily   Continuous Infusions: . sodium chloride    . azithromycin    . cefTRIAXone (ROCEPHIN)  IV Stopped (07/24/19 0930)    LOS: 1 day   Kerney Elbe, DO [facility] PAGER is on [place]  If 7PM-7AM, please contact night-coverage [email or web address]

## 2019-07-24 NOTE — Progress Notes (Signed)
CODE SEPSIS - PHARMACY COMMUNICATION  **Broad Spectrum Antibiotics should be administered within 1 hour of Sepsis diagnosis**  Time Code Sepsis Called/Page Received: 2239  Antibiotics Ordered: azithro/ceftriaxone  Time of 1st antibiotic administration: 2041  Additional action taken by pharmacy:   If necessary, Name of Provider/Nurse Contacted:     Tobie Lords ,PharmD Clinical Pharmacist  07/24/2019  12:48 AM

## 2019-07-24 NOTE — ED Notes (Signed)
Pt is dry.

## 2019-07-24 NOTE — ED Notes (Signed)
Pt sleeping. Oxygen in place. Bed in low and locked position with siderails up.

## 2019-07-24 NOTE — Progress Notes (Signed)
PHARMACIST - PHYSICIAN COMMUNICATION  CONCERNING:  Enoxaparin (Lovenox) for DVT Prophylaxis    RECOMMENDATION: Patient was prescribed enoxaprin 40mg  q24 hours for VTE prophylaxis.   Filed Weights   07/23/19 1918  Weight: 94 kg (207 lb 3.7 oz)    Body mass index is 29.73 kg/m.  Estimated Creatinine Clearance: 29.2 mL/min (A) (by C-G formula based on SCr of 1.82 mg/dL (H)).  Patient is candidate for enoxaparin 30mg  every 24 hours based on CrCl <71ml/min   DESCRIPTION: Pharmacy has adjusted enoxaparin dose per [facility] policy.   Patient is now receiving enoxaparin 30mg  every 24 hours.  Lu Duffel, PharmD, BCPS Clinical Pharmacist 07/24/2019 7:42 AM

## 2019-07-24 NOTE — Progress Notes (Signed)
*  PRELIMINARY RESULTS* Echocardiogram 2D Echocardiogram has been performed.  [name] 07/24/2019, 1:07 PM

## 2019-07-24 NOTE — ED Notes (Signed)
Lunch tray given. 

## 2019-07-24 NOTE — ED Notes (Signed)
Pt is dry. Pt sleeping, resps unolabored, oxygen remains in place.

## 2019-07-24 NOTE — Progress Notes (Signed)
I have attempted to get the [facility] for this pt from [facility]. They sent me half of the pt's MAR and not the rest. I have called them twice since then and still have not received the [facility] for this pt.

## 2019-07-24 NOTE — ED Notes (Signed)
Pt sleeping, oxygen remains in place.

## 2019-07-25 ENCOUNTER — Inpatient Hospital Stay: Payer: Medicare Other

## 2019-07-25 ENCOUNTER — Encounter: Payer: Self-pay | Admitting: Family Medicine

## 2019-07-25 DIAGNOSIS — I2510 Atherosclerotic heart disease of native coronary artery without angina pectoris: Secondary | ICD-10-CM | POA: Diagnosis present

## 2019-07-25 DIAGNOSIS — N390 Urinary tract infection, site not specified: Secondary | ICD-10-CM | POA: Diagnosis present

## 2019-07-25 DIAGNOSIS — I10 Essential (primary) hypertension: Secondary | ICD-10-CM

## 2019-07-25 DIAGNOSIS — G9340 Encephalopathy, unspecified: Secondary | ICD-10-CM

## 2019-07-25 DIAGNOSIS — Z515 Encounter for palliative care: Secondary | ICD-10-CM

## 2019-07-25 DIAGNOSIS — N179 Acute kidney failure, unspecified: Secondary | ICD-10-CM

## 2019-07-25 DIAGNOSIS — N183 Chronic kidney disease, stage 3 unspecified: Secondary | ICD-10-CM | POA: Diagnosis present

## 2019-07-25 DIAGNOSIS — Z7189 Other specified counseling: Secondary | ICD-10-CM

## 2019-07-25 LAB — CBC WITH DIFFERENTIAL/PLATELET
Abs Immature Granulocytes: 0.15 10*3/uL — ABNORMAL HIGH (ref 0.00–0.07)
Basophils Absolute: 0.1 10*3/uL (ref 0.0–0.1)
Basophils Relative: 0 %
Eosinophils Absolute: 0 10*3/uL (ref 0.0–0.5)
Eosinophils Relative: 0 %
HCT: 33.9 % — ABNORMAL LOW (ref 39.0–52.0)
Hemoglobin: 11 g/dL — ABNORMAL LOW (ref 13.0–17.0)
Immature Granulocytes: 1 %
Lymphocytes Relative: 10 %
Lymphs Abs: 1.7 10*3/uL (ref 0.7–4.0)
MCH: 28.9 pg (ref 26.0–34.0)
MCHC: 32.4 g/dL (ref 30.0–36.0)
MCV: 89 fL (ref 80.0–100.0)
Monocytes Absolute: 1.7 10*3/uL — ABNORMAL HIGH (ref 0.1–1.0)
Monocytes Relative: 10 %
Neutro Abs: 13.7 10*3/uL — ABNORMAL HIGH (ref 1.7–7.7)
Neutrophils Relative %: 79 %
Platelets: 140 10*3/uL — ABNORMAL LOW (ref 150–400)
RBC: 3.81 MIL/uL — ABNORMAL LOW (ref 4.22–5.81)
RDW: 14.6 % (ref 11.5–15.5)
WBC: 17.3 10*3/uL — ABNORMAL HIGH (ref 4.0–10.5)
nRBC: 0 % (ref 0.0–0.2)

## 2019-07-25 LAB — COMPREHENSIVE METABOLIC PANEL
ALT: 16 U/L (ref 0–44)
AST: 34 U/L (ref 15–41)
Albumin: 3.1 g/dL — ABNORMAL LOW (ref 3.5–5.0)
Alkaline Phosphatase: 101 U/L (ref 38–126)
Anion gap: 11 (ref 5–15)
BUN: 30 mg/dL — ABNORMAL HIGH (ref 8–23)
CO2: 25 mmol/L (ref 22–32)
Calcium: 9 mg/dL (ref 8.9–10.3)
Chloride: 100 mmol/L (ref 98–111)
Creatinine, Ser: 1.6 mg/dL — ABNORMAL HIGH (ref 0.61–1.24)
GFR calc Af Amer: 42 mL/min — ABNORMAL LOW (ref >60)
GFR calc non Af Amer: 37 mL/min — ABNORMAL LOW (ref >60)
Glucose, Bld: 115 mg/dL — ABNORMAL HIGH (ref 70–99)
Potassium: 3.9 mmol/L (ref 3.5–5.1)
Sodium: 136 mmol/L (ref 135–145)
Total Bilirubin: 0.8 mg/dL (ref 0.3–1.2)
Total Protein: 7.7 g/dL (ref 6.5–8.1)

## 2019-07-25 LAB — MAGNESIUM: Magnesium: 2.2 mg/dL (ref 1.7–2.4)

## 2019-07-25 LAB — URINE CULTURE: Culture: >=100000 — AB

## 2019-07-25 LAB — PHOSPHORUS: Phosphorus: 2.5 mg/dL (ref 2.5–4.6)

## 2019-07-25 MED ORDER — TORSEMIDE 20 MG PO TABS
20.0000 mg | ORAL_TABLET | ORAL | Status: DC
  Administered 2019-07-26: 20 mg via ORAL
  Filled 2019-07-25: qty 1

## 2019-07-25 NOTE — Consult Note (Signed)
Consultation Note Date: 07/25/2019   Patient Name: [name]  DOB: Nov 18, 1923  MRN: [id number]  Age / Sex: 86 y.o., male  PCP: Odessa Fleming, NP Referring Physician: Cristal Ford, DO  Reason for Consultation: Establishing goals of care and Psychosocial/spiritual support  HPI/Patient Profile: 86 y.o. male living in [place] for 2 years with past medical history of heart failure with preserved EF echo fall 2019 EF 60 to 65%, [name], HTN/HLD, anxiety and depression, chronic back pain, chronic left pleural effusion, lymphoblastic lymphoma, sleep apnea will not use CPAP, GERD, kyphoplasty T10 2018, bilateral knee replacements 2018, left femur fracture with rod, arm fracture 2018, former smoker admitted on 07/23/2019 with sepsis secondary to UTI and community-acquired pneumonia with subsequent metabolic encephalopathy..   Clinical Assessment and Goals of Care: I have reviewed medical records including EPIC notes, labs and imaging, received report from bedside nursing staff and [facility] team, examined the patient.  Mr. Cromartie is resting quietly in bed.  He greets me making and mostly keeping eye contact.  He is alert and oriented to person and place, but not month.  He is able to make his basic needs known.  There is no family at bedside at this time.   Mr. Fertig is active with [facility] out patient palliative services.     Call to son, Brandonn Capelli to discuss diagnosis prognosis, [place], EOL wishes, disposition and options. Marya Amsler shares that Mr. Dicostanzo is having some memory loss, and has good days and bad days.   As far as functional and nutritional status, Mr. Limb is living in [place] for 2 years.  Marya Amsler shares that Mr. Hallquist is almost WC bound.  We discuss PT services and short term rehab. Mr. Linnemann uses walker to get to bathroom. Marya Amsler shares that he would prefer [facility] PT at [place] if possible.  Mr.  Nestor shares a room with his wife at [place].    We discussed current illness and what it means in the larger context of on-going co-morbidities.  Natural disease trajectory and expectations at EOL were discussed.  Likelihood for recurrent UTI discussed. We talk about the treatment plan and selected labs.   Advanced directives, and rehospitalization were considered and discussed.  Mr. Chaloux is DNR, re hospitalize as needed. Continue out patient palliative services.  Mr. Fricke is active with [facility] out patient Palliative Care services.  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  We talk about the concept of "let nature take it's course". Marya Amsler shares that he has considered this and is open to caring for Mr. Brion Aliment in this way.  Marya Amsler shares that if his father agrees, they will not treat the next infection.     Questions and concerns were addressed.  The family was encouraged to call with questions or concerns.    HCPOA    HCPOA - Son Nicholas Trompeter is legal HC and durable POA.  Not legal guardian.  SUMMARY OF RECOMMENDATIONS   Goal is to return to [facility] with [facility] over STR.  Considering "let nature take it's course", do not treat the next infection [facility] liaison notified.  Code Status/Advance Care Planning:  DNR  Symptom Management:   Per hospitalist, no additional needs at this time.  Palliative Prophylaxis:   Oral Care and Turn Reposition  Additional Recommendations (Limitations, Scope, Preferences):  Treat the treatable but no CPR or intubation.  Psycho-social/Spiritual:   Desire for further Chaplaincy support:no  Additional Recommendations: Caregiving  Support/Resources and Education on Hospice  Prognosis:   Unable to determine, based on outcomes.  6 to 12 months or less would not be surprising based on chronic illness burden, declining functional status.  Discharge Planning: Anticipate return to ALF,  short-term rehab if needed prior.      Primary Diagnoses: Present on Admission: . Sepsis ([place]) . Benign hypertension . Acute on chronic diastolic heart failure ([place]) . Acute encephalopathy . UTI (urinary tract infection) . CAP (community acquired pneumonia) . Coronary artery disease . Acute kidney injury ([place]) . CKD (chronic kidney disease), stage III   I have reviewed the medical record, interviewed the patient and family, and examined the patient. The following aspects are pertinent.  Past Medical History:  Diagnosis Date  . (HFpEF) heart failure with preserved ejection fraction ([place])    a. 11/2017 Echo: EF 60-65%, no rwma, GR1 DD. Nl RV fxn.  . Anginal pain ([place])    c/o heaviness a few years ago.  stent placed and all resolved  . Anxiety   . BPH (benign prostatic hypertrophy)   . Carotid stenosis    a. 05/2015 Carotid U/S: mild nonobs atherosclerosis. No need for f/u.  Marland Kitchen Chronic back pain   . Chronic Left Pleural Effusion    a. 11/2017 Mod by CXR.  . CKD (chronic kidney disease), stage III   . Coronary artery disease    a. 06/2013 Cath/PCI: LAD 50-70p (FFR 0.82-->PCI w/ 3.5x24 Promus DES), RCA subtotally occluded w/ L->R collats, LCX 30ost.  . Dyslipidemia   . GERD (gastroesophageal reflux disease)   . Hx of [name]   . Hyperlipidemia   . Hypertension   . Inguinal hernia   . Lightheadedness    a. chronic, somewhat positional.  . 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 Major depression   . Sleep apnea    will not use cpap   Social History   Socioeconomic History  . Marital status: Married    Spouse name: Not on file  . Number of children: Not on file  . Years of education: Not on file  . Highest education level: Not on file  Occupational History  . Not on file  Tobacco Use  . Smoking status: Former Smoker    Packs/day: 2.00    Years: 7.00    Pack years: 14.00    Types: Cigarettes     Quit date: 09/17/1953    Years since quitting: 65.8  . Smokeless tobacco: Never Used  Substance and Sexual Activity  . Alcohol use: No    Alcohol/week: 0.0 standard drinks  . Drug use: No  . Sexual activity: Not Currently  Other Topics Concern  . Not on file  Social History Narrative  . Not on file   Social Determinants of Health   Financial Resource Strain:   . Difficulty of Paying Living Expenses:   Food  Insecurity:   . Worried About Charity fundraiser in the Last Year:   . Arboriculturist in the Last Year:   Transportation Needs:   . Film/video editor (Medical):   Marland Kitchen Lack of Transportation (Non-Medical):   Physical Activity:   . Days of Exercise per Week:   . Minutes of Exercise per Session:   Stress:   . Feeling of Stress :   Social Connections:   . Frequency of Communication with Friends and Family:   . Frequency of Social Gatherings with Friends and Family:   . Attends Religious Services:   . Active Member of Clubs or Organizations:   . Attends Archivist Meetings:   Marland Kitchen Marital Status:    Family History  Problem Relation Age of Onset  . Heart disease Mother   . Stroke Mother   . Diabetes Father   . Dementia Father   . Stroke Sister   . Stroke Brother   . Stroke Sister   . Stroke Sister   . Stroke Brother   . Stroke Brother    Scheduled Meds: . acidophilus  1 capsule Oral Daily  . albuterol  2.5 mg Inhalation Q6H  . ascorbic acid  500 mg Oral Daily  . cholecalciferol  5,000 Units Oral Daily  . docusate sodium  100 mg Oral BID  . enoxaparin (LOVENOX) injection  30 mg Subcutaneous Q24H  . escitalopram  20 mg Oral Daily  . gabapentin  300 mg Oral QHS  . lidocaine  1 patch Transdermal Daily  . lidocaine  5 mL Mouth/Throat TID  . metoprolol tartrate  12.5 mg Oral BID  . multivitamin with minerals  1 tablet Oral Daily  . pantoprazole  40 mg Oral Daily  . pravastatin  40 mg Oral QHS  . senna  1 tablet Oral QHS  . sodium chloride flush  3 mL  Intravenous Q12H  . [START ON 07/26/2019] torsemide  20 mg Oral Q M,W,F  . zinc sulfate  220 mg Oral Daily   Continuous Infusions: . sodium chloride    . azithromycin 500 mg (07/24/19 2202)  . cefTRIAXone (ROCEPHIN)  IV 2 g (07/25/19 0857)   PRN Meds:.sodium chloride, acetaminophen **OR** acetaminophen, alum & mag hydroxide-simeth, clonazePAM, magic mouthwash, magnesium hydroxide, nitroGLYCERIN, ondansetron **OR** ondansetron (ZOFRAN) IV, polyethylene glycol, sodium chloride flush, traMADol, traZODone Medications Prior to Admission:  Prior to Admission medications   Medication Sig Start Date End Date Taking? Authorizing Provider  acetaminophen (TYLENOL) 500 MG tablet Take 1,000 mg by mouth 3 (three) times daily.   Yes [provider]  acidophilus (RISAQUAD) CAPS capsule Take 1 capsule by mouth daily.   Yes [provider]  albuterol (VENTOLIN HFA) 108 (90 Base) MCG/ACT inhaler Inhale 2 puffs into the lungs every 6 (six) hours. 03/31/19  Yes Nolberto Hanlon, MD  alum & mag hydroxide-simeth ([place]) 200-200-20 MG/5ML suspension Take 30 mLs by mouth every 6 (six) hours as needed for indigestion or heartburn.   Yes [provider]  antiseptic oral rinse (BIOTENE) LIQD 15 mLs by Mouth Rinse route 2 (two) times daily as needed for dry mouth.   Yes [provider]  apixaban (ELIQUIS) 2.5 MG TABS tablet Take 2.5 mg by mouth 2 (two) times daily.   Yes [provider]  ascorbic acid (VITAMIN C) 500 MG tablet Take 1 tablet (500 mg total) by mouth daily. 03/31/19  Yes Nolberto Hanlon, MD  Cholecalciferol (VITAMIN D3) 5000 units CAPS Take 5,000  Units by mouth daily.    Yes [provider]  clonazePAM (KLONOPIN) 0.5 MG tablet Take 0.5 mg by mouth in the morning, at noon, and at bedtime.    Yes [provider]  diclofenac sodium (VOLTAREN) 1 % GEL Apply 2 g topically 2 (two) times daily as needed (lower back pain). (use when lidocaine patch is off)   Yes  [provider]  docusate sodium (COLACE) 100 MG capsule Take 100 mg by mouth 2 (two) times daily.   Yes [provider]  doxazosin (CARDURA) 1 MG tablet Take 1 tablet (1 mg total) by mouth daily. 05/28/17  Yes Lada, Satira Anis, MD  escitalopram (LEXAPRO) 20 MG tablet Take 20 mg by mouth daily.    Yes [provider]  gabapentin (NEURONTIN) 300 MG capsule Take 300 mg by mouth at bedtime.  11/18/18  Yes [provider]  guaiFENesin (ROBITUSSIN) 100 MG/5ML liquid Take 200 mg by mouth every 6 (six) hours as needed for cough.    Yes [provider]  hydrocortisone 2.5 % cream Apply 1 application topically every other day. (apply to scalp and alternate with ketoconazole) 12/08/18  Yes [provider]  isosorbide mononitrate (IMDUR) 30 MG 24 hr tablet Take 1 tablet (30 mg total) by mouth daily. 02/15/17  Yes Roselee Nova, MD  LACTOBACILLUS PO Take by mouth daily.   Yes [provider]  Lidocaine (ASPERCREME LIDOCAINE) 4 % PTCH Apply 1 patch topically 2 (two) times daily.   Yes [provider]  lidocaine (XYLOCAINE) 2 % solution Use as directed 5 mLs in the mouth or throat 3 (three) times daily. 03/31/19  Yes Nolberto Hanlon, MD  loperamide (IMODIUM) 2 MG capsule Take 2 mg by mouth as needed for diarrhea or loose stools. (max 8 doses in 24 hours)   Yes [provider]  Lutein 10 MG TABS Take 10 mg by mouth 2 (two) times daily.   Yes [provider]  magic mouthwash SOLN Take 10 mLs by mouth 3 (three) times daily as needed for mouth pain. Patient taking differently: Take 10 mLs by mouth 4 (four) times daily as needed for mouth pain.  03/31/19  Yes Nolberto Hanlon, MD  melatonin 5 MG TABS Take 5 mg by mouth at bedtime as needed (sleep).    Yes [provider]  metoprolol tartrate (LOPRESSOR) 25 MG tablet Take 12.5 mg by mouth 2 (two) times daily.    Yes [provider]  Multiple Vitamins-Iron (MULTIVITAMINS  WITH IRON) TABS tablet Take 1 tablet by mouth daily.   Yes [provider]  nitroGLYCERIN (NITROSTAT) 0.4 MG SL tablet Place 0.4 mg under the tongue every 5 (five) minutes as needed for chest pain.   Yes [provider]  ondansetron (ZOFRAN) 4 MG tablet Take 4 mg by mouth every 6 (six) hours as needed for nausea or vomiting.   Yes [provider]  pantoprazole (PROTONIX) 40 MG tablet Take 1 tablet (40 mg total) by mouth daily. 03/16/17  Yes Roselee Nova, MD  Phenylephrine-Cocoa Butter (PREPARATION H RE) Place 1 application rectally 4 (four) times daily as needed (rectal pain/itching).    Yes [provider]  polyethylene glycol (MIRALAX / GLYCOLAX) packet Take 17 g by mouth daily as needed for mild constipation or moderate constipation.    Yes [provider]  pravastatin (PRAVACHOL) 40 MG tablet Take 40 mg by mouth at bedtime.   Yes [provider]  senna (  SENOKOT) 8.6 MG TABS tablet Take 1 tablet by mouth at bedtime.    Yes [provider]  torsemide (DEMADEX) 20 MG tablet Take 1 tablet (20 mg) by mouth once a day on Mondays/ Wednesdays/ & Fridays Patient taking differently: Take 20 mg by mouth every Monday, Wednesday, and Friday.  04/26/19  Yes Loel Dubonnet, NP  traMADol (ULTRAM) 50 MG tablet Take 1 tablet (50 mg total) by mouth every 8 (eight) hours as needed. Patient to carry to his room 09/29/18  Yes Triplett, Cari B, FNP  triamcinolone cream (KENALOG) 0.1 % Apply 1 application topically 2 (two) times daily.   Yes [provider]  zinc sulfate 220 (50 Zn) MG capsule Take 220 mg by mouth daily.   Yes [provider]   Allergies  Allergen Reactions  . Cyclobenzaprine Other (See Comments)    Hallucination. Mental instability.  DO NOT GIVE ANY MUSCLE RELAXANTS  . Other Other (See Comments)  . Sulfamethoxazole Other (See Comments)  . Baclofen Other (See Comments)    Confusion and weakness  . Nsaids      Avoids due to liver.  . Sulfamethoxazole-Trimethoprim Nausea Only and Other (See Comments)    Patient unaware of this as an allergy  . Tolmetin Other (See Comments)    Avoids due to liver.it is an nsaid   Review of Systems  Unable to perform ROS: Dementia    Physical Exam Vitals and nursing note reviewed.  Constitutional:      General: He is not in acute distress.    Appearance: He is ill-appearing.  HENT:     Head: Atraumatic.     Mouth/Throat:     Mouth: Mucous membranes are moist.  Cardiovascular:     Rate and Rhythm: Normal rate.  Pulmonary:     Effort: Pulmonary effort is normal. No respiratory distress.  Abdominal:     General: There is distension.     Palpations: Abdomen is soft.     Tenderness: There is no abdominal tenderness.  Skin:    General: Skin is warm and dry.  Neurological:     Mental Status: He is alert.     Comments: Known dementia, oriented to person and place  Psychiatric:        Mood and Affect: Mood normal.        Behavior: Behavior normal.     Comments: Calm and cooperative, not fearful     Vital Signs: BP 136/70 (BP Location: Left Arm)   Pulse 75   Temp 97.6 F (36.4 C)   Resp 16   Ht 5\' 10"  (1.778 m)   Wt 92.3 kg   SpO2 92%   BMI 29.18 kg/m  Pain Scale: 0-10   Pain Score: 0-No pain   SpO2: SpO2: 92 % O2 Device:SpO2: 92 % O2 Flow Rate: .O2 Flow Rate (L/min): 2 L/min  IO: Intake/output summary:   Intake/Output Summary (Last 24 hours) at 07/25/2019 1240 Last data filed at 07/25/2019 5284 Gross per 24 hour  Intake 250 ml  Output 450 ml  Net -200 ml    LBM: Last BM Date: 07/24/19 Baseline Weight: Weight: 94 kg Most recent weight: Weight: 92.3 kg     Palliative Assessment/Data:   Flowsheet Rows     Most Recent Value  Intake Tab  Referral Department  Hospitalist  Unit at Time of Referral  Other (Comment)  Palliative Care Primary Diagnosis  Sepsis/Infectious Disease  Date Notified  07/24/19  Palliative Care Type  Return  patient Palliative Care  Reason for referral  Clarify Goals of Care  Date of Admission  07/23/19  Date first seen by Palliative Care  07/25/19  # of days Palliative referral response time  1 Day(s)  # of days IP prior to Palliative referral  1  Clinical Assessment  Palliative Performance Scale Score  30%  Pain Max last 24 hours  Not able to report  Pain Min Last 24 hours  Not able to report  Dyspnea Max Last 24 Hours  Not able to report  Dyspnea Min Last 24 hours  Not able to report  Psychosocial & Spiritual Assessment  Palliative Care Outcomes      Time In: 1040 Time Out: 1150 Time Total: 70 minutes  Greater than 50%  of this time was spent counseling and coordinating care related to the above assessment and plan.  Signed by: Drue Novel, NP   Please contact Palliative Medicine Team phone at [phone] for questions and concerns.  For individual provider: See Shea Evans

## 2019-07-25 NOTE — Progress Notes (Addendum)
Progress Note    [name]  VOH:[id number] DOB: Apr 07, 1923  DOA: 07/23/2019 PCP: Odessa Fleming, NP    Brief Narrative:    Medical records reviewed and are as summarized below:  [name] is an 86 y.o. male with a past medical history that includes [name], CHF, hypertension, dyslipidemia, GERD, lymphoma admitted June 6 with sepsis secondary to UTI and community-acquired pneumonia by metabolic encephalopathy and acute on chronic diastolic heart failure.  Provided with Rocephin and Zithromax and IV Lasix  Assessment/Plan:   Principal Problem:   Sepsis ([place]) Active Problems:   CAP (community acquired pneumonia)   Acute on chronic diastolic heart failure ([place])   Acute encephalopathy   UTI (urinary tract infection)   Benign hypertension   Acute kidney injury ([place])   Coronary artery disease   #1.  Sepsis secondary to UTI/community-acquired pneumonia.  Improving.  He is afebrile hemodynamically stable.  Lactic acid within the limits of normal.  Max temp 98.4.  He is provided with Rocephin and azithromycin. Urine culture with ecoli sensative to rocephin.  Excision saturation level greater than 90% on room air -Continue antibiotics -transition to oral when indicated -monitor.  #2.  Metabolic encephalopathy.  Secondary to #1.  Improving.  CT of the head no evidence of acute intracranial abnormality.  Chronic volume loss and microvascular angiopathy.  No metabolic derangements.  Evaluated by PT who recommend SNF. -mobilize as able  #3.  Acute on chronic diastolic heart failure.  X-ray on admission with opacities with indistinct vascularity may reflect developing edema though patchy opacity in the right could reflect infection as well, chronic loculated left pleural effusion.  Chest x-ray today with chronic and loculated left pleural effusion no edema consolidation or pneumothorax.  Echocardiogram reveals an EF of 55 to 60%, right ventricular systolic function normal, no left ventricular  hypertrophy.  Was provided IV Lasix.  Home medications include Demadex.  Not appear volume overloaded at this time -Stop IV Lasix -Resume home Demadex -Monitor intake and output -Obtain daily weights  #4.  Acute kidney injury superimposed on CKD IIIa. Likely related to aboc  Creatinine trending down to 1.6.  Baseline difficult to ascertain. -Hold nephrotoxins as able -Monitor urine output -Recheck in the morning  #5.  Thrombocytopenia.  [place] related to sepsis.  Platelets 140 this morning down from 148 yesterday.  No sign symptoms of active bleeding. -Monitor  #6.  [name].  Status post cath 2015.  No chest pain.  Home medications include metoprolol and statin.  No events on telemetry -Continue home meds -Monitor  #7.  Anxiety/depression.  Stable at baseline.  Home medications include clonazepam 1 mg 3 times daily. -Continue home meds  #8.  Dyslipidemia.  Home medications include a statin -Continue home meds    Family Communication/Anticipated D/C date and plan/Code Status   DVT prophylaxis: Lovenox ordered. Code Status: dnr Family Communication: patient Disposition Plan: Status is: Inpatient  Remains inpatient appropriate because:Inpatient level of care appropriate due to severity of illness   Dispo: The patient is from: Home              Anticipated d/c is to: SNF              Anticipated d/c date is: 1 day              Patient currently is not medically stable to d/c.    Medical Consultants:    None.   Anti-Infectives:    Rocephin  azithromycin  Subjective:   Very HOH. Denies pain or discomfort  Objective:    Vitals:   07/24/19 2018 07/24/19 2032 07/25/19 0248 07/25/19 0746  BP: (!) 142/56   (!) 144/68  Pulse: 88   83  Resp: 18   18  Temp: 98.4 F (36.9 C)   98.3 F (36.8 C)  TempSrc: Oral   Oral  SpO2: 96%  97% 100%  Weight:  92.3 kg    Height:        Intake/Output Summary (Last 24 hours) at 07/25/2019 1102 Last data filed at 07/25/2019  3557 Gross per 24 hour  Intake 250 ml  Output 450 ml  Net -200 ml   Filed Weights   07/23/19 1918 07/24/19 2032  Weight: 94 kg 92.3 kg    Exam: General: Well-nourished cooperative no acute distress CV: Regular rate and rhythm no murmur gallop or rub Respiratory: No increased work of breathing.  Breath sounds were diminished no crackles no wheeze Abdomen: Soft positive bowel sounds nontender to palpation no guarding or rebounding Musculoskeletal: Joints without swelling/erythema Neuro alert and oriented x2 speech clear but slow  Data Reviewed:   I have personally reviewed following labs and imaging studies:  Labs: Labs show the following:   Basic Metabolic Panel: Recent Labs  Lab 07/23/19 1923 07/23/19 1923 07/24/19 0313 07/25/19 0449  NA 134*  --  136 136  K 4.7   < > 4.3 3.9  CL 101  --  102 100  CO2 23  --  28 25  GLUCOSE 101*  --  123* 115*  BUN 35*  --  36* 30*  CREATININE 1.73*  --  1.82* 1.60*  CALCIUM 9.5  --  9.1 9.0  MG  --   --   --  2.2  PHOS  --   --   --  2.5   < > = values in this interval not displayed.   GFR Estimated Creatinine Clearance: 32.9 mL/min (A) (by C-G formula based on SCr of 1.6 mg/dL (H)). Liver Function Tests: Recent Labs  Lab 07/23/19 1923 07/25/19 0449  AST 23 34  ALT 17 16  ALKPHOS 99 101  BILITOT 0.7 0.8  PROT 8.0 7.7  ALBUMIN 3.6 3.1*   No results for input(s): LIPASE, AMYLASE in the last 168 hours. No results for input(s): AMMONIA in the last 168 hours. Coagulation profile No results for input(s): INR, PROTIME in the last 168 hours.  CBC: Recent Labs  Lab 07/23/19 1923 07/24/19 0313 07/25/19 0449  WBC 19.5* 22.3* 17.3*  NEUTROABS 17.0*  --  13.7*  HGB 11.3* 10.2* 11.0*  HCT 35.1* 30.7* 33.9*  MCV 89.8 87.7 89.0  PLT 161 148* 140*   Cardiac Enzymes: No results for input(s): CKTOTAL, CKMB, CKMBINDEX, TROPONINI in the last 168 hours. BNP (last 3 results) No results for input(s): PROBNP in the last 8760  hours. CBG: No results for input(s): GLUCAP in the last 168 hours. D-Dimer: No results for input(s): DDIMER in the last 72 hours. Hgb A1c: No results for input(s): HGBA1C in the last 72 hours. Lipid Profile: No results for input(s): CHOL, HDL, LDLCALC, TRIG, CHOLHDL, LDLDIRECT in the last 72 hours. Thyroid function studies: No results for input(s): TSH, T4TOTAL, T3FREE, THYROIDAB in the last 72 hours.  Invalid input(s): FREET3 Anemia work up: No results for input(s): VITAMINB12, FOLATE, FERRITIN, TIBC, IRON, RETICCTPCT in the last 72 hours. Sepsis Labs: Recent Labs  Lab 07/23/19 1923 07/24/19 0313 07/25/19 0449  PROCALCITON  --  1.61  --   WBC 19.5* 22.3* 17.3*  LATICACIDVEN 1.6  --   --     Microbiology Recent Results (from the past 240 hour(s))  Culture, blood (routine x 2)     Status: None (Preliminary result)   Collection Time: 07/23/19  7:23 PM   Specimen: BLOOD  Result Value Ref Range Status   Specimen Description BLOOD RIGHT ANTECUBITAL  Final   Special Requests   Final    BOTTLES DRAWN AEROBIC AND ANAEROBIC Blood Culture results may not be optimal due to an excessive volume of blood received in culture bottles   Culture   Final    NO GROWTH 2 DAYS Performed at [facility], [address] [place], [place] [address]    Report Status PENDING  Incomplete  Culture, blood (routine x 2)     Status: None (Preliminary result)   Collection Time: 07/23/19  8:22 PM   Specimen: BLOOD  Result Value Ref Range Status   Specimen Description BLOOD BLOOD LEFT HAND  Final   Special Requests   Final    BOTTLES DRAWN AEROBIC AND ANAEROBIC Blood Culture results may not be optimal due to an excessive volume of blood received in culture bottles   Culture   Final    NO GROWTH 2 DAYS Performed at [facility], [address], [place], [place] [address]    Report Status PENDING  Incomplete  Urine culture     Status: Abnormal   Collection Time: 07/23/19  8:22 PM    Specimen: Urine, Random  Result Value Ref Range Status   Specimen Description   Final    URINE, RANDOM Performed at [facility], [address] [place], [place] [address]    Special Requests   Final    NONE Performed at [facility], [address], [place], [place] [address]    Culture >=100,000 COLONIES/mL ESCHERICHIA COLI (A)  Final   Report Status 07/25/2019 FINAL  Final   Organism ID, Bacteria ESCHERICHIA COLI (A)  Final      Susceptibility   Escherichia coli - MIC*    AMPICILLIN >=32 RESISTANT Resistant     CEFAZOLIN <=4 SENSITIVE Sensitive     CEFTRIAXONE <=1 SENSITIVE Sensitive     CIPROFLOXACIN <=0.25 SENSITIVE Sensitive     GENTAMICIN <=1 SENSITIVE Sensitive     IMIPENEM <=0.25 SENSITIVE Sensitive     NITROFURANTOIN <=16 SENSITIVE Sensitive     TRIMETH/SULFA <=20 SENSITIVE Sensitive     AMPICILLIN/SULBACTAM 8 SENSITIVE Sensitive     PIP/TAZO <=4 SENSITIVE Sensitive     * >=100,000 COLONIES/mL ESCHERICHIA COLI  SARS Coronavirus 2 by RT PCR (hospital order, performed in [place] hospital lab) Nasopharyngeal Nasopharyngeal Swab     Status: None   Collection Time: 07/23/19  8:22 PM   Specimen: Nasopharyngeal Swab  Result Value Ref Range Status   SARS Coronavirus 2 NEGATIVE NEGATIVE Final    Comment: (NOTE) SARS-CoV-2 target nucleic acids are NOT DETECTED. The SARS-CoV-2 RNA is generally detectable in upper and lower respiratory specimens during the acute phase of infection. The lowest concentration of SARS-CoV-2 viral copies this assay can detect is 250 copies / mL. A negative result does not preclude SARS-CoV-2 infection and should not be used as the sole basis for treatment or other patient management decisions.  A negative result may occur with improper specimen collection / handling, submission of specimen other than nasopharyngeal swab, presence of viral mutation(s) within the areas targeted by this  assay, and inadequate number of viral  copies (<250 copies / mL). A negative result must be combined with clinical observations, patient history, and epidemiological information. Fact Sheet for Patients:   StrictlyIdeas.no Fact Sheet for Healthcare Providers: BankingDealers.co.za This test is not yet approved or cleared  by the [place] FDA and has been authorized for detection and/or diagnosis of SARS-CoV-2 by FDA under an Emergency Use Authorization (EUA).  This EUA will remain in effect (meaning this test can be used) for the duration of the COVID-19 declaration under Section 564(b)(1) of the Act, 21 U.S.C. section 360bbb-3(b)(1), unless the authorization is terminated or revoked sooner. Performed at [facility], [address] [place], [place] [address]   MRSA PCR Screening     Status: None   Collection Time: 07/24/19  8:20 PM   Specimen: Nasal Mucosa; Nasopharyngeal  Result Value Ref Range Status   MRSA by PCR NEGATIVE NEGATIVE Final    Comment:        The GeneXpert MRSA Assay (FDA approved for NASAL specimens only), is one component of a comprehensive MRSA colonization surveillance program. It is not intended to diagnose MRSA infection nor to guide or monitor treatment for MRSA infections. Performed at [facility], [address] [place], [place] [address]     Procedures and diagnostic studies:  DG Chest 1 View  Result Date: 07/25/2019 CLINICAL DATA:  Shortness of breath EXAM: CHEST  1 VIEW COMPARISON:  Two days ago FINDINGS: Chronic left pleural effusion with volume loss and pleural thickening by recent CT. Cardiomegaly. There is no edema, consolidation, or pneumothorax. Remote right mid clavicle fracture IMPRESSION: 1. Chronic and loculated left pleural effusion. 2. Stable aeration. Electronically Signed   By: Monte Fantasia M.D.   On: 07/25/2019 06:04   DG Chest 2 View  Result Date: 07/23/2019 CLINICAL DATA:  Fall, low-grade  temperature EXAM: CHEST - 2 VIEW COMPARISON:  Radiograph 03/27/2019 CT 02/19/2016 FINDINGS: Redemonstration of the chronic, loculated left pleural effusion which extends over the lung apex. Adjacent opacity in the lung likely reflecting some atelectatic change though underlying consolidation is difficult to exclude. Increasing reticular and patchy opacity in the right lung base with indistinct pulmonary vascularity as well as fissural and septal thickening. Cardiomediastinal silhouette is partially obscured by left basilar opacity though overall appears similar to the comparison with a calcified, tortuous aorta. Redemonstration of the midthoracic compression deformity (likely T10) with vertebroplasty changes. Multilevel flowing anterior osteophytosis, compatible with features of diffuse idiopathic skeletal hyperostosis (DISH). Nonunited midshaft right clavicular fracture is in similar of alignment to comparison from February. Chronic deformity of the sternum is also noted. Few subacute to chronic appearing rib deformities of the left third rib and right second rib. No acute rib fracture. IMPRESSION: 1. Increasing reticular opacities with indistinct vascularity may reflect developing edema though more patchy opacity in the right lung base could reflect infection as well in the appropriate clinical setting. 2. Chronic, loculated left pleural effusion which extends over the lung apex. Adjacent opacity in the lung likely reflecting some atelectatic change though underlying consolidation is difficult to exclude. 3. Nonunited fracture of the right midclavicle. Stable alignment from priors. 4. Remote bilateral rib fractures. 5. Remote sternal fracture. 6. Features of DISH. Electronically Signed   By: Lovena Le M.D.   On: 07/23/2019 19:24   CT Head Wo Contrast  Result Date: 07/23/2019 CLINICAL DATA:  Fall at 14:30 with increasing confusion EXAM: CT HEAD WITHOUT CONTRAST CT CERVICAL SPINE WITHOUT CONTRAST  TECHNIQUE:  Multidetector CT imaging of the head and cervical spine was performed following the standard protocol without intravenous contrast. Multiplanar CT image reconstructions of the cervical spine were also generated. COMPARISON:  CT 09/29/2018, 03/27/2019 FINDINGS: CT HEAD FINDINGS Brain: Remote appearing lacune again seen in the left cerebellum. No evidence of acute infarction, hemorrhage, hydrocephalus, extra-axial collection or mass lesion/mass effect. Symmetric prominence of the ventricles, cisterns and sulci compatible with parenchymal volume loss. Patchy areas of white matter hypoattenuation are most compatible with chronic microvascular angiopathy. Vascular: Atherosclerotic calcification of the carotid siphons and intradural vertebral arteries. No hyperdense vessel. Skull: Chronic scarring seen towards the left parietal vertex. No acute focal soft tissue swelling, hematoma, or contusive change. No calvarial fracture or suspicious osseous lesion. Sinuses/Orbits: Mild mural disease in the ethmoid sinuses. Remaining paranasal sinuses and mastoids are predominantly clear with postsurgical changes from prior wall down right mastoidectomy. Slight hypo pneumatization of the left mastoids. Orbital structures are unremarkable aside from prior lens extractions. Other: Chronic deformity of the nasal bones. CT CERVICAL SPINE FINDINGS Alignment: Stabilization collar absent at the time of examination. Mild leftward cranial rotation. No evidence of traumatic listhesis. No abnormally widened, perched or jumped facets. Normal alignment of the craniocervical and atlantoaxial articulations. Skull base and vertebrae: Advanced arthrosis at the atlantoaxial and basion-dens intervals with hypertrophic change and degenerative spurring. Additional multilevel cervical spondylitic changes and osteophyte formations. Bony fusion of the C5-C7 levels involving the vertebral bodies and posterior elements C5-C7 on the left and the posterior  elements on the right at C5-6. No acute skull base fracture, vertebral body fracture or height loss is seen. Evaluation of the lower lumbar levels mildly limited due to a combination of motion artifact and photon starvation. Soft tissues and spinal canal: No pre or paravertebral fluid or swelling. No visible canal hematoma. Disc levels: Multilevel intervertebral disc height loss with spondylitic endplate changes, facet hypertrophic changes, and uncinate spurring. Findings result in some mild canal stenoses at C3-4. Moderate stenoses at C5-6, C6-7 and more moderate to severe stenosis C4-5. There is multilevel mild-to-moderate foraminal narrowing as well with more severe narrowing bilaterally at C3-4 and C4-5. Upper chest: No acute abnormality in the upper chest or imaged lung apices. Other: Calcification of the proximal great vessels as well as the cervical carotid arteries. Normal thyroid. IMPRESSION: 1. No evidence of acute intracranial abnormality. 2. Remote appearing lacune in the left cerebellum. 3. Chronic volume loss and microvascular angiopathy. 4. No evidence of acute fracture or traumatic listhesis of the cervical spine. 5. Advanced multilevel cervical spondylitic changes with mild canal stenoses at C3-4. Moderate stenoses at C5-6, C6-7 and more moderate to severe stenosis C4-5. There is multilevel mild-to-moderate foraminal narrowing as well with more severe narrowing bilaterally at C3-4 and C4-5. 6. Prior right mastoidectomy. 7. Intracranial and cervical atherosclerosis. Electronically Signed   By: Lovena Le M.D.   On: 07/23/2019 19:18   CT Cervical Spine Wo Contrast  Result Date: 07/23/2019 CLINICAL DATA:  Fall at 14:30 with increasing confusion EXAM: CT HEAD WITHOUT CONTRAST CT CERVICAL SPINE WITHOUT CONTRAST TECHNIQUE: Multidetector CT imaging of the head and cervical spine was performed following the standard protocol without intravenous contrast. Multiplanar CT image reconstructions of the  cervical spine were also generated. COMPARISON:  CT 09/29/2018, 03/27/2019 FINDINGS: CT HEAD FINDINGS Brain: Remote appearing lacune again seen in the left cerebellum. No evidence of acute infarction, hemorrhage, hydrocephalus, extra-axial collection or mass lesion/mass effect. Symmetric prominence of the ventricles, cisterns and sulci compatible  with parenchymal volume loss. Patchy areas of white matter hypoattenuation are most compatible with chronic microvascular angiopathy. Vascular: Atherosclerotic calcification of the carotid siphons and intradural vertebral arteries. No hyperdense vessel. Skull: Chronic scarring seen towards the left parietal vertex. No acute focal soft tissue swelling, hematoma, or contusive change. No calvarial fracture or suspicious osseous lesion. Sinuses/Orbits: Mild mural disease in the ethmoid sinuses. Remaining paranasal sinuses and mastoids are predominantly clear with postsurgical changes from prior wall down right mastoidectomy. Slight hypo pneumatization of the left mastoids. Orbital structures are unremarkable aside from prior lens extractions. Other: Chronic deformity of the nasal bones. CT CERVICAL SPINE FINDINGS Alignment: Stabilization collar absent at the time of examination. Mild leftward cranial rotation. No evidence of traumatic listhesis. No abnormally widened, perched or jumped facets. Normal alignment of the craniocervical and atlantoaxial articulations. Skull base and vertebrae: Advanced arthrosis at the atlantoaxial and basion-dens intervals with hypertrophic change and degenerative spurring. Additional multilevel cervical spondylitic changes and osteophyte formations. Bony fusion of the C5-C7 levels involving the vertebral bodies and posterior elements C5-C7 on the left and the posterior elements on the right at C5-6. No acute skull base fracture, vertebral body fracture or height loss is seen. Evaluation of the lower lumbar levels mildly limited due to a  combination of motion artifact and photon starvation. Soft tissues and spinal canal: No pre or paravertebral fluid or swelling. No visible canal hematoma. Disc levels: Multilevel intervertebral disc height loss with spondylitic endplate changes, facet hypertrophic changes, and uncinate spurring. Findings result in some mild canal stenoses at C3-4. Moderate stenoses at C5-6, C6-7 and more moderate to severe stenosis C4-5. There is multilevel mild-to-moderate foraminal narrowing as well with more severe narrowing bilaterally at C3-4 and C4-5. Upper chest: No acute abnormality in the upper chest or imaged lung apices. Other: Calcification of the proximal great vessels as well as the cervical carotid arteries. Normal thyroid. IMPRESSION: 1. No evidence of acute intracranial abnormality. 2. Remote appearing lacune in the left cerebellum. 3. Chronic volume loss and microvascular angiopathy. 4. No evidence of acute fracture or traumatic listhesis of the cervical spine. 5. Advanced multilevel cervical spondylitic changes with mild canal stenoses at C3-4. Moderate stenoses at C5-6, C6-7 and more moderate to severe stenosis C4-5. There is multilevel mild-to-moderate foraminal narrowing as well with more severe narrowing bilaterally at C3-4 and C4-5. 6. Prior right mastoidectomy. 7. Intracranial and cervical atherosclerosis. Electronically Signed   By: Lovena Le M.D.   On: 07/23/2019 19:18   ECHOCARDIOGRAM COMPLETE  Result Date: 07/24/2019    ECHOCARDIOGRAM REPORT   Patient Name:   [name] Date of Exam: 07/24/2019 Medical Rec #:  [id number]    Height:       70.0 in Accession #:    [id number]   Weight:       207.2 lb Date of Birth:  Mar 03, 1923     BSA:          2.119 m Patient Age:    35 years     BP:           166/77 mmHg Patient Gender: M            HR:           101 bpm. Exam Location:  [facility] Procedure: 2D Echo, Color Doppler and Cardiac Doppler Indications:     I50.31 CHF-Acute Diastolic  History:         Patient has  prior history of Echocardiogram examinations.  HFpEF, [name]; Risk Factors:Sleep Apnea, Hypertension and                  Dyslipidemia.  Sonographer:     Charmayne Sheer RDCS (AE) Referring Phys:  [id number] [place] Diagnosing Phys: Kathlyn Sacramento MD  Sonographer Comments: Technically difficult study due to poor echo windows. Image acquisition challenging due to patient body habitus. IMPRESSIONS  1. Left ventricular ejection fraction, by estimation, is 55 to 60%. The left ventricle has normal function. Left ventricular endocardial border not optimally defined to evaluate regional wall motion. Left ventricular diastolic parameters are indeterminate.  2. Right ventricular systolic function is normal. The right ventricular size is normal. Tricuspid regurgitation signal is inadequate for assessing PA pressure.  3. The mitral valve is normal in structure. No evidence of mitral valve regurgitation. No evidence of mitral stenosis.  4. The aortic valve is normal in structure. Aortic valve regurgitation is not visualized. Mild aortic valve sclerosis is present, with no evidence of aortic valve stenosis.  5. Technically difficult study due to poor echo windows. FINDINGS  Left Ventricle: Left ventricular ejection fraction, by estimation, is 55 to 60%. The left ventricle has normal function. Left ventricular endocardial border not optimally defined to evaluate regional wall motion. The left ventricular internal cavity size was normal in size. There is no left ventricular hypertrophy. Left ventricular diastolic parameters are indeterminate. Right Ventricle: The right ventricular size is normal. No increase in right ventricular wall thickness. Right ventricular systolic function is normal. Tricuspid regurgitation signal is inadequate for assessing PA pressure. Left Atrium: Left atrial size was normal in size. Right Atrium: Right atrial size was normal in size. Pericardium: There is no evidence of pericardial effusion.  Mitral Valve: The mitral valve is normal in structure. Normal mobility of the mitral valve leaflets. No evidence of mitral valve regurgitation. No evidence of mitral valve stenosis. MV peak gradient, 7.0 mmHg. The mean mitral valve gradient is 4.0 mmHg. Tricuspid Valve: The tricuspid valve is normal in structure. Tricuspid valve regurgitation is not demonstrated. No evidence of tricuspid stenosis. Aortic Valve: The aortic valve is normal in structure. Aortic valve regurgitation is not visualized. Mild aortic valve sclerosis is present, with no evidence of aortic valve stenosis. Aortic valve mean gradient measures 4.0 mmHg. Aortic valve peak gradient measures 6.7 mmHg. Aortic valve area, by VTI measures 2.58 cm. Pulmonic Valve: The pulmonic valve was normal in structure. Pulmonic valve regurgitation is not visualized. No evidence of pulmonic stenosis. Aorta: The aortic root is normal in size and structure. Venous: The inferior vena cava was not well visualized. IAS/Shunts: No atrial level shunt detected by color flow Doppler.  LEFT VENTRICLE PLAX 2D LVIDd:         3.29 cm  Diastology LVIDs:         2.81 cm  LV e' lateral:   7.07 cm/s LV PW:         0.95 cm  LV E/e' lateral: 15.7 LV IVS:        0.93 cm  LV e' medial:    8.59 cm/s LVOT diam:     2.30 cm  LV E/e' medial:  12.9 LV SV:         60 LV SV Index:   28 LVOT Area:     4.15 cm  LEFT ATRIUM         Index LA diam:    3.00 cm 1.42 cm/m  AORTIC VALVE  PULMONIC VALVE AV Area (Vmax):    2.59 cm    PV Vmax:       0.85 m/s AV Area (Vmean):   2.25 cm    PV Vmean:      58.000 cm/s AV Area (VTI):     2.58 cm    PV VTI:        0.157 m AV Vmax:           129.00 cm/s PV Peak grad:  2.9 mmHg AV Vmean:          97.100 cm/s PV Mean grad:  2.0 mmHg AV VTI:            0.232 m AV Peak Grad:      6.7 mmHg AV Mean Grad:      4.0 mmHg LVOT Vmax:         80.30 cm/s LVOT Vmean:        52.500 cm/s LVOT VTI:          0.144 m LVOT/AV VTI ratio: 0.62  AORTA Ao Root  diam: 3.60 cm MITRAL VALVE MV Area (PHT): 5.58 cm     SHUNTS MV Peak grad:  7.0 mmHg     Systemic VTI:  0.14 m MV Mean grad:  4.0 mmHg     Systemic Diam: 2.30 cm MV Vmax:       1.32 m/s MV Vmean:      91.2 cm/s MV Decel Time: 136 msec MV E velocity: 111.00 cm/s MV A velocity: 109.00 cm/s MV E/A ratio:  1.02 Kathlyn Sacramento MD Electronically signed by Kathlyn Sacramento MD Signature Date/Time: 07/24/2019/4:50:19 PM    Final     Medications:   . acidophilus  1 capsule Oral Daily  . albuterol  2.5 mg Inhalation Q6H  . ascorbic acid  500 mg Oral Daily  . cholecalciferol  5,000 Units Oral Daily  . docusate sodium  100 mg Oral BID  . enoxaparin (LOVENOX) injection  30 mg Subcutaneous Q24H  . escitalopram  20 mg Oral Daily  . furosemide  40 mg Intravenous Daily  . gabapentin  300 mg Oral QHS  . lidocaine  1 patch Transdermal Daily  . lidocaine  5 mL Mouth/Throat TID  . metoprolol tartrate  12.5 mg Oral BID  . multivitamin with minerals  1 tablet Oral Daily  . pantoprazole  40 mg Oral Daily  . pravastatin  40 mg Oral QHS  . senna  1 tablet Oral QHS  . sodium chloride flush  3 mL Intravenous Q12H  . zinc sulfate  220 mg Oral Daily   Continuous Infusions: . sodium chloride    . azithromycin 500 mg (07/24/19 2202)  . cefTRIAXone (ROCEPHIN)  IV 2 g (07/25/19 0857)     LOS: 2 days   Radene Gunning NP  [facility]   How to contact the [facility] Attending or Consulting provider [place] or covering provider during after hours [place], for this patient?  1. Check the care team in [facility] and look for a) attending/consulting TRH provider listed and b) the [facility] team listed 2. Log into [email or web address] and use [place]'s universal password to access. If you do not have the password, please contact the hospital operator. 3. Locate the [facility] provider you are looking for under [facility] and page to a number that you can be directly reached. 4. If you still have difficulty reaching the provider, please  page the [facility] (Director on Call) for the Hospitalists listed  on amion for assistance.  07/25/2019, 11:02 AM

## 2019-07-25 NOTE — Plan of Care (Signed)
°  Problem: Coping: °Goal: Level of anxiety will decrease °Outcome: Progressing °  °

## 2019-07-25 NOTE — NC FL2 (Signed)
[place] LEVEL OF CARE SCREENING TOOL     IDENTIFICATION  Patient Name: [name] Birthdate: 23-Mar-1923 Sex: male Admission Date (Current Location): 07/23/2019  [facility] and [place] Number:  Engineering geologist and Address:  [facility], [address], [place], [place] [address]      Provider Number: [id number]  Attending Physician Name and Address:  Cristal Ford, DO  Relative Name and Phone Number:       Current Level of Care: Hospital Recommended Level of Care: [place] Prior Approval Number:    Date Approved/Denied:   PASRR Number:    Discharge Plan: Other (Comment)(ALF)    Current Diagnoses: Patient Active Problem List   Diagnosis Date Noted  . Sepsis ([place]) 07/23/2019  . Advanced care planning/counseling discussion   . Goals of care, counseling/discussion   . Pressure injury of skin 03/28/2019  . Acute on chronic diastolic heart failure ([place]) 03/27/2019  . Pneumonia due to COVID-19 virus 03/27/2019  . COVID-19 03/27/2019  . Malnutrition of moderate degree 06/15/2017  . Pneumonia 06/13/2017  . 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 S/P kyphoplasty 01/14/2017  . Respiratory failure, post-operative ([place]) 12/31/2016  . Acute respiratory failure ([place]) 12/30/2016  . Altered mental status 12/07/2016  . Acute kidney injury ([place]) 09/14/2016  . Head injury, intracranial, without loss of consciousness or fracture ([place]) 09/07/2016  . Groin pain, left 06/15/2016  . Closed right hip fracture, with routine healing, subsequent encounter 04/15/2016  . Productive cough 03/23/2016  . Medicare annual wellness visit, subsequent 02/19/2016  . Major depression 10/14/2015  . Behavior disturbance 09/12/2015  . Mobility impaired 08/27/2015  . Skin ulcer of back ([place]) 07/01/2015  . Laceration of right hand 07/01/2015  . Traumatic hematoma of lower back 07/01/2015  .  Encounters for administrative purpose 04/01/2015  . Skin lesion of face 12/24/2014  . Contusion of right hand 11/28/2014  . Herpes zoster 10/30/2014  . Need for immunization against influenza 10/24/2014  . Compression fracture of L4 lumbar vertebra 09/28/2014  . Rectal or anal pain 08/13/2014  . Anxiety disorder 07/23/2014  . BPH without obstruction/lower urinary tract symptoms 07/23/2014  . Atherosclerosis of coronary artery 07/23/2014  . Chronic LBP 07/23/2014  . Hypercholesteremia 07/23/2014  . Benign hypertension 07/23/2014  . Acid reflux 07/23/2014  . Chronic recurrent major depressive disorder ([place]) 07/23/2014  . GERD (gastroesophageal reflux disease) 07/23/2014  . Coronary artery disease involving native coronary artery without angina pectoris 07/06/2014  . Essential hypertension 07/06/2014  . Carotid stenosis 07/06/2014  . Hyperlipidemia   . H/O [name] 12/20/2011    Orientation RESPIRATION BLADDER Height & Weight     Self, Place  Normal External catheter, Incontinent(placed 6/7) Weight: 203 lb 6.4 oz (92.3 kg) Height:  5\' 10"  (177.8 cm)  BEHAVIORAL SYMPTOMS/MOOD NEUROLOGICAL BOWEL NUTRITION STATUS      Incontinent Diet(heart healthy, thin liquids)  AMBULATORY STATUS COMMUNICATION OF NEEDS Skin   Limited Assist Verbally Normal                       Personal Care Assistance Level of Assistance  Dressing, Bathing, Feeding Bathing Assistance: Limited assistance Feeding assistance: Independent Dressing Assistance: Limited assistance     Functional Limitations Info  Sight, Hearing, Speech Sight Info: Adequate Hearing Info: Adequate Speech Info: Adequate    SPECIAL CARE FACTORS FREQUENCY  PT (By licensed PT), OT (By licensed OT)  Contractures Contractures Info: Not present    Additional Factors Info  Code Status, Allergies Code Status Info: DNR Allergies Info: Cyclobenzaprine, Other, Sulfamethoxazole, Baclofen, Nsaids,  Sulfamethoxazole-trimethoprim, Tolmetin           Current Medications (07/25/2019):  This is the current hospital active medication list Current Facility-Administered Medications  Medication Dose Route Frequency Provider Last Rate Last Admin  . 0.9 %  sodium chloride infusion  250 mL Intravenous PRN Mansy, Jan A, MD      . acetaminophen (TYLENOL) tablet 650 mg  650 mg Oral Q6H PRN Mansy, Jan A, MD       Or  . acetaminophen (TYLENOL) suppository 650 mg  650 mg Rectal Q6H PRN Mansy, Jan A, MD      . acidophilus (RISAQUAD) capsule 1 capsule  1 capsule Oral Daily Mansy, Jan A, MD   1 capsule at 07/25/19 1032  . albuterol (PROVENTIL) (2.5 MG/3ML) 0.083% nebulizer solution 2.5 mg  2.5 mg Inhalation Q6H Mansy, Jan A, MD   2.5 mg at 07/25/19 0738  . alum & mag hydroxide-simeth (MAALOX/MYLANTA) 200-200-20 MG/5ML suspension 15 mL  15 mL Oral Q6H PRN Mansy, Jan A, MD      . ascorbic acid (VITAMIN C) tablet 500 mg  500 mg Oral Daily Mansy, Jan A, MD   500 mg at 07/25/19 1033  . azithromycin (ZITHROMAX) 500 mg in sodium chloride 0.9 % 250 mL IVPB  500 mg Intravenous Q24H Mansy, Jan A, MD 250 mL/hr at 07/24/19 2202 500 mg at 07/24/19 2202  . cefTRIAXone (ROCEPHIN) 2 g in sodium chloride 0.9 % 100 mL IVPB  2 g Intravenous Q24H Mansy, Jan A, MD 200 mL/hr at 07/25/19 0857 2 g at 07/25/19 0857  . cholecalciferol (VITAMIN D3) tablet 5,000 Units  5,000 Units Oral Daily Mansy, Jan A, MD   5,000 Units at 07/25/19 1033  . clonazePAM (KLONOPIN) tablet 1 mg  1 mg Oral TID PRN Mansy, Jan A, MD      . docusate sodium (COLACE) capsule 100 mg  100 mg Oral BID Mansy, Jan A, MD   100 mg at 07/25/19 1034  . enoxaparin (LOVENOX) injection 30 mg  30 mg Subcutaneous Q24H Lu Duffel, RPH   30 mg at 07/24/19 2156  . escitalopram (LEXAPRO) tablet 20 mg  20 mg Oral Daily Mansy, Jan A, MD   20 mg at 07/24/19 1051  . furosemide (LASIX) injection 40 mg  40 mg Intravenous Daily Mansy, Jan A, MD   40 mg at 07/25/19 1029  .  gabapentin (NEURONTIN) capsule 300 mg  300 mg Oral QHS Mansy, Jan A, MD   300 mg at 07/24/19 2154  . lidocaine (LIDODERM) 5 % 1 patch  1 patch Transdermal Daily Mansy, Jan A, MD   1 patch at 07/24/19 1058  . lidocaine (XYLOCAINE) 2 % viscous mouth solution 5 mL  5 mL Mouth/Throat TID Mansy, Jan A, MD      . magic mouthwash  10 mL Oral TID PRN Mansy, Jan A, MD      . magnesium hydroxide (MILK OF MAGNESIA) suspension 30 mL  30 mL Oral Daily PRN Mansy, Jan A, MD      . metoprolol tartrate (LOPRESSOR) tablet 12.5 mg  12.5 mg Oral BID Mansy, Jan A, MD   12.5 mg at 07/25/19 1034  . multivitamin with minerals tablet 1 tablet  1 tablet Oral Daily Mansy, Jan A, MD   1 tablet at 07/24/19 1048  . nitroGLYCERIN (  NITROSTAT) SL tablet 0.4 mg  0.4 mg Sublingual Q5 min PRN Mansy, Jan A, MD      . ondansetron [facility]) tablet 4 mg  4 mg Oral Q6H PRN Mansy, Jan A, MD       Or  . ondansetron [facility]) injection 4 mg  4 mg Intravenous Q6H PRN Mansy, Jan A, MD      . pantoprazole (PROTONIX) EC tablet 40 mg  40 mg Oral Daily Mansy, Jan A, MD   40 mg at 07/24/19 1053  . polyethylene glycol (MIRALAX / GLYCOLAX) packet 17 g  17 g Oral Daily PRN Mansy, Jan A, MD      . pravastatin (PRAVACHOL) tablet 40 mg  40 mg Oral QHS Mansy, Jan A, MD   40 mg at 07/24/19 2154  . senna (SENOKOT) tablet 8.6 mg  1 tablet Oral QHS Mansy, Jan A, MD   8.6 mg at 07/24/19 2154  . sodium chloride flush (NS) 0.9 % injection 3 mL  3 mL Intravenous Q12H Mansy, Jan A, MD   3 mL at 07/24/19 2157  . sodium chloride flush (NS) 0.9 % injection 3 mL  3 mL Intravenous PRN Mansy, Jan A, MD      . traMADol Veatrice Bourbon) tablet 50 mg  50 mg Oral Q8H PRN Mansy, Jan A, MD      . traZODone (DESYREL) tablet 25 mg  25 mg Oral QHS PRN Mansy, Jan A, MD   25 mg at 07/24/19 2154  . zinc sulfate capsule 220 mg  220 mg Oral Daily Mansy, Jan A, MD   220 mg at 07/25/19 1032     Discharge Medications: Please see discharge summary for a list of discharge  medications.  Relevant Imaging Results:  Relevant Lab Results:   Additional Information SSN:[id number]  Gerrianne Scale Zawadi Aplin, LCSW

## 2019-07-25 NOTE — Progress Notes (Signed)
[facility] Liaison note:  Patient is currently followed by Manufacturing engineer community palliative program at [facility]. TOC Bridget Cobb made aware.  Flo Shanks BSN, RN, [place] [phone]

## 2019-07-25 NOTE — TOC Initial Note (Addendum)
Transition of Care [facility]) - Initial/Assessment Note    Patient Details  Name: [name] MRN: [id number] Date of Birth: [birth year]  Transition of Care [facility]) CM/SW Contact:    Eileen Stanford, LCSW Phone Number: 07/25/2019, 10:36 AM  Clinical Narrative:  Pt is only alert to self and place. CSW spoke with pt's Son via telephone. Pt's son states pt has [facility] at [facility] for two years. Pt has been to SNF --[facility] in the past. If it is determined that pt will need SNF pt's son would prefer [facility]. CSW will continue to follow for therapy recommendations.   Therapy recommending HH. Pt is active with [name], receiving [place] RN.                Expected Discharge Plan: [place] Barriers to Discharge: Continued Medical Work up   Patient Goals and CMS Choice Patient states their goals for this hospitalization and ongoing recovery are:: for pt to get better CMS Medicare.gov Compare Post Acute Care list provided to:: Patient Represenative (must comment) Choice offered to / list presented to : Adult Children  Expected Discharge Plan and Services Expected Discharge Plan: [place] In-house Referral: Clinical Social Work   Post Acute Care Choice: Home Health, Skilled Nursing Facility(undetermined at this time) Living arrangements for the past 2 months: [place]                                      Prior Living Arrangements/Services Living arrangements for the past 2 months: [place] Lives with:: Self Patient language and need for interpreter reviewed:: Yes Do you feel safe going back to the place where you live?: Yes      Need for Family Participation in Patient Care: Yes (Comment) Care giver support system in place?: Yes (comment) Current home services: Home PT Criminal Activity/Legal Involvement Pertinent to Current Situation/Hospitalization: No - Comment as needed  Activities of Daily  Living   ADL Screening (condition at time of admission) Patient's cognitive ability adequate to safely complete daily activities?: Yes Is the patient deaf or have difficulty hearing?: Yes Does the patient have difficulty seeing, even when wearing glasses/contacts?: No Does the patient have difficulty concentrating, remembering, or making decisions?: Yes Patient able to express need for assistance with ADLs?: Yes Does the patient have difficulty dressing or bathing?: Yes Independently performs ADLs?: No Does the patient have difficulty walking or climbing stairs?: Yes Weakness of Legs: Both Weakness of Arms/Hands: None  Permission Sought/Granted Permission sought to share information with : Family Supports Permission granted to share information with : Yes, Verbal Permission Granted  Share Information with NAME: Belenda Cruise  Permission granted to share info w AGENCY: [place] granted to share info w Relationship: son     Emotional Assessment Appearance:: Appears stated age Attitude/Demeanor/Rapport: Unable to Assess Affect (typically observed): Unable to Assess Orientation: : Oriented to Self, Oriented to Place Alcohol / Substance Use: Not Applicable Psych Involvement: No (comment)  Admission diagnosis:  SOB (shortness of breath) [R06.02] Sepsis ([place]) [A41.9] Urinary tract infection without hematuria, site unspecified [N39.0] Altered mental status, unspecified altered mental status type [R41.82] Community acquired pneumonia, unspecified laterality [J18.9] Congestive heart failure, unspecified HF chronicity, unspecified heart failure type ([place]) [I50.9] Patient Active Problem List   Diagnosis Date Noted  . Sepsis ([place]) 07/23/2019  . Advanced care  planning/counseling discussion   . Goals of care, counseling/discussion   . Pressure injury of skin 03/28/2019  . Acute on chronic diastolic heart failure ([place]) 03/27/2019  . Pneumonia due to COVID-19 virus 03/27/2019   . COVID-19 03/27/2019  . Malnutrition of moderate degree 06/15/2017  . Pneumonia 06/13/2017  . 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 S/P kyphoplasty 01/14/2017  . Respiratory failure, post-operative ([place]) 12/31/2016  . Acute respiratory failure ([place]) 12/30/2016  . Altered mental status 12/07/2016  . Acute kidney injury ([place]) 09/14/2016  . Head injury, intracranial, without loss of consciousness or fracture ([place]) 09/07/2016  . Groin pain, left 06/15/2016  . Closed right hip fracture, with routine healing, subsequent encounter 04/15/2016  . Productive cough 03/23/2016  . Medicare annual wellness visit, subsequent 02/19/2016  . Major depression 10/14/2015  . Behavior disturbance 09/12/2015  . Mobility impaired 08/27/2015  . Skin ulcer of back ([place]) 07/01/2015  . Laceration of right hand 07/01/2015  . Traumatic hematoma of lower back 07/01/2015  . Encounters for administrative purpose 04/01/2015  . Skin lesion of face 12/24/2014  . Contusion of right hand 11/28/2014  . Herpes zoster 10/30/2014  . Need for immunization against influenza 10/24/2014  . Compression fracture of L4 lumbar vertebra 09/28/2014  . Rectal or anal pain 08/13/2014  . Anxiety disorder 07/23/2014  . BPH without obstruction/lower urinary tract symptoms 07/23/2014  . Atherosclerosis of coronary artery 07/23/2014  . Chronic LBP 07/23/2014  . Hypercholesteremia 07/23/2014  . Benign hypertension 07/23/2014  . Acid reflux 07/23/2014  . Chronic recurrent major depressive disorder ([place]) 07/23/2014  . GERD (gastroesophageal reflux disease) 07/23/2014  . Coronary artery disease involving native coronary artery without angina pectoris 07/06/2014  . Essential hypertension 07/06/2014  . Carotid stenosis 07/06/2014  . Hyperlipidemia   . H/O [name] 12/20/2011   PCP:  Odessa Fleming, NP Pharmacy:   [facility], [place] [address] [place] [address] Phone: [phone] Fax: [phone]     Social Determinants of Health (SDOH) Interventions    Readmission Risk Interventions Readmission Risk Prevention Plan 07/25/2019 03/28/2019  Transportation Screening Complete Complete  PCP or Specialist Appt within 3-5 Days Complete Complete  HRI or Home Care Consult Complete (No Data)  Social Work Consult for [place] Planning/Counseling Complete (No Data)  Palliative Care Screening Not Applicable Not Applicable  Medication Review Press photographer) Complete Complete  Some recent data might be hidden

## 2019-07-26 LAB — CBC
HCT: 35.7 % — ABNORMAL LOW (ref 39.0–52.0)
Hemoglobin: 11.8 g/dL — ABNORMAL LOW (ref 13.0–17.0)
MCH: 28.6 pg (ref 26.0–34.0)
MCHC: 33.1 g/dL (ref 30.0–36.0)
MCV: 86.7 fL (ref 80.0–100.0)
Platelets: 182 10*3/uL (ref 150–400)
RBC: 4.12 MIL/uL — ABNORMAL LOW (ref 4.22–5.81)
RDW: 14.4 % (ref 11.5–15.5)
WBC: 18.7 10*3/uL — ABNORMAL HIGH (ref 4.0–10.5)
nRBC: 0 % (ref 0.0–0.2)

## 2019-07-26 LAB — BASIC METABOLIC PANEL
Anion gap: 12 (ref 5–15)
BUN: 31 mg/dL — ABNORMAL HIGH (ref 8–23)
CO2: 25 mmol/L (ref 22–32)
Calcium: 9 mg/dL (ref 8.9–10.3)
Chloride: 100 mmol/L (ref 98–111)
Creatinine, Ser: 1.45 mg/dL — ABNORMAL HIGH (ref 0.61–1.24)
GFR calc Af Amer: 48 mL/min — ABNORMAL LOW (ref >60)
GFR calc non Af Amer: 41 mL/min — ABNORMAL LOW (ref >60)
Glucose, Bld: 130 mg/dL — ABNORMAL HIGH (ref 70–99)
Potassium: 3.8 mmol/L (ref 3.5–5.1)
Sodium: 137 mmol/L (ref 135–145)

## 2019-07-26 MED ORDER — CEFUROXIME AXETIL 500 MG PO TABS: 500.0000 mg | ORAL_TABLET | Freq: Two times a day (BID) | ORAL | 0 refills | Status: AC

## 2019-07-26 MED ORDER — CEFUROXIME AXETIL 500 MG PO TABS
500.0000 mg | ORAL_TABLET | Freq: Two times a day (BID) | ORAL | Status: DC
  Filled 2019-07-26: qty 1

## 2019-07-26 MED ORDER — CEFUROXIME AXETIL 500 MG PO TABS: 500.0000 mg | ORAL_TABLET | Freq: Two times a day (BID) | ORAL | 0 refills | Status: DC

## 2019-07-26 MED ORDER — CIPROFLOXACIN HCL 500 MG PO TABS: 500.0000 mg | ORAL_TABLET | Freq: Two times a day (BID) | ORAL | Status: DC

## 2019-07-26 MED ORDER — ALBUTEROL SULFATE (2.5 MG/3ML) 0.083% IN NEBU
2.5000 mg | INHALATION_SOLUTION | Freq: Three times a day (TID) | RESPIRATORY_TRACT | Status: DC
  Administered 2019-07-26 (×2): 2.5 mg via RESPIRATORY_TRACT
  Filled 2019-07-26 (×2): qty 3

## 2019-07-26 NOTE — Plan of Care (Signed)
  Problem: Clinical Measurements: Goal: Respiratory complications will improve Outcome: Progressing   

## 2019-07-26 NOTE — TOC Transition Note (Addendum)
Transition of Care [facility]) - CM/SW Discharge Note   Patient Details  Name: [name] MRN: [id number] Date of Birth: 23-Nov-1923  [facility]) CM/SW Contact:  Eileen Stanford, LCSW Phone Number: 07/26/2019, 1:03 PM   Clinical Narrative:   Clinical Social Worker facilitated patient discharge including contacting patient family and facility to confirm patient discharge plans.  Clinical information faxed to facility and family agreeable with plan.  CSW arranged ambulance transport via [facility] to [facility].  RN to call [phone] for report prior to discharge.   Final next level of care: Assisted Living Barriers to Discharge: No Barriers Identified   Patient Goals and CMS Choice Patient states their goals for this hospitalization and ongoing recovery are:: for pt to get better CMS Medicare.gov Compare Post Acute Care list provided to:: Patient Represenative (must comment) Choice offered to / list presented to : Adult Children  Discharge Placement              Patient chooses bed at: (return to [place] house) Patient to be transferred to facility by: [facility] Name of family member notified: Gregory-son Patient and family notified of of transfer: 07/26/19  Discharge Plan and Services In-house Referral: Clinical Social Work   Post Acute Care Choice: Home Health, Skilled Nursing Facility(undetermined at this time)                    HH Arranged: PT, Therapist, sports, Nurse's Aide [place] Agency: [name] [employer] Date [place]: 07/26/19 Time [place]: [place] Representative spoke with at [place]: teresa  Social Determinants of Health ([place]) Interventions     Readmission Risk Interventions Readmission Risk Prevention Plan 07/25/2019 03/28/2019  Transportation Screening Complete Complete  PCP or Specialist Appt within 3-5 Days Complete Complete  HRI or Home Care Consult Complete (No Data)  Social Work Consult for [place]  Planning/Counseling Complete (No Data)  Palliative Care Screening Not Applicable Not Applicable  Medication Review Press photographer) Complete Complete  Some recent data might be hidden

## 2019-07-26 NOTE — Consult Note (Signed)
Changed cipro to ceftin to cover CAP and UTI. Pt is 86 yr old with a CrCl < 50 and higher rate of c.diff, cipro is not recommended.   Eleonore Chiquito, PharmD, BCPS.

## 2019-07-26 NOTE — Discharge Summary (Signed)
Physician Discharge Summary  DAGON BUDAI FBP:[id number] DOB: 1923/02/27 DOA: 07/23/2019  PCP: Odessa Fleming, NP  Admit date: 07/23/2019 Discharge date: 07/26/2019  Admitted From: [name] [facility] Discharge disposition: [place] [facility]   Recommendations for Outpatient Follow-Up:   1. Back to [facility] with [facility] Palliative care. Patient and family leaning toward comfort care.    Discharge Diagnosis:   Principal Problem:   Sepsis ([place]) Active Problems:   CAP (community acquired pneumonia)   Acute on chronic diastolic heart failure ([place])   Acute encephalopathy   UTI (urinary tract infection)   Benign hypertension   Acute kidney injury ([place])   CKD (chronic kidney disease), stage III   Coronary artery disease   Palliative care by specialist   Encounter for hospice care discussion    Discharge Condition: Improved.  Diet recommendation: Low sodium, heart healthy.    Regular.  Wound care: None.  Code status: dnr   History of Present Illness:   Noriel Frayne  is a 86 y.o. Caucasian male with a known history of coronary artery disease, CHF, hypertension, dyslipidemia, GERD and lymphoma, presented to the emergency room 6/6 with acute onset of altered mental status and fall without injuries.  According to the patient's wife he had been more confused than his normal and feeling generally weak.  He was noted to have a fever 101.4 in the ER. Oxygen saturation level was down to 88% on room air.  Fairly poor historian due to altered mental status as well as hearing loss.  He had been having cough with inability to expectorate.  He has chronic low back pain.  No chest pain or palpitations.  No headache or dizziness or blurred vision.  No paresthesias or focal muscle weakness.  Upon presentation to the emergency room, temperature was 101.4 and blood pressure was 141/68 with otherwise normal vital signs.  Oxygen saturation came up to 97% on 2 L of O2 by nasal cannula. Labs revealed mild  hyponatremia, BUN of 35 and creatinine 1.73 above previous levels that were consistent with stage IIIa chronic kidney disease with a BNP of 130.  High-sensitivity troponin I was 7 letter 6.  Lactic acid was 1.6.  CBC showed significant leukocytosis of 19.5 with neutrophilia as well as anemia close to baseline.  UA was positive for UTI.  Blood cultures were sent as well.  Head and neck CT scan revealed no acute intracranial abnormalities and remote appearing lacunar 1 in the left cerebellum, chronic volume loss and microvascular angiopathy and advanced multilevel cervical spondylitic changes with mild canal stenosis at C3-4 and moderate stenosis at C5-6 and C6-7 with more moderate to severe stenosis at C4-5 with mild to moderate foraminal narrowing and more severe narrowing bilaterally at C3-4 and C4-5.  It showed prior right mastoidectomy and intracranial and cervical atherosclerosis.  The patient was given IV Rocephin and Zithromax 1 g. Tylenol as well as hydration with IV normal saline.  He was admitted to a progressive unit bed for further evaluation and management.  Hospital Course by Problem:   #1.  Sepsis secondary to UTI/community-acquired pneumonia.  Improving.  he was provided with iv fluids and antibiotics. At discharge lactic acid within the limits of normal.  Max temp 98.4.  Urine culture with ecoli sensative to rocephin.  Oxygen saturation level greater than 90% on room air. He will be discharged with ceftin to cover CAP and UTI for  3 more days to complete 7 day course. Marland Kitchen  #2.  Metabolic encephalopathy.  Secondary to #1.  Improving.  CT of the head no evidence of acute intracranial abnormality.  Chronic volume loss and microvascular angiopathy. Palliative care consult obtained. disucusion with family indicate leaning toward comfort care and would not likely treat next infection.   No metabolic derangements. Patient from [place] and had [place] prior to admission. Will resume HH at discharge  adding Palliative care.   #3.  Acute on chronic diastolic heart failure.  X-ray on admission with opacities with indistinct vascularity may reflect developing edema though patchy opacity in the right could reflect infection as well, chronic loculated left pleural effusion.  Chest x-ray 6/8 with chronic and loculated left pleural effusion no edema consolidation or pneumothorax.  Echocardiogram revealed an EF of 55 to 60%, right ventricular systolic function normal, no left ventricular hypertrophy.  Was provided IV Lasix.  Home medications include Demadex.  Not appear volume overloaded at discharge. Home demadex resumed.   #4.  Acute kidney injury superimposed on CKD IIIa. Likely related to aboc  Creatinine trending down to 1.6 which is close to baseline at discharge.   #5.  Thrombocytopenia. Likey related to sepsis.  Platelets  182 at discharge. No sign symptoms of active bleeding.  #6.  [name].  Status post cath 2015.  No chest pain.  Home medications include metoprolol and statin.  No events on telemetry  #7.  Anxiety/depression.  Stable at baseline.  Home medications include clonazepam 1 mg 3 times daily.  #8.  Dyslipidemia.  Home medications include a statin    Medical Consultants:      Discharge Exam:   Vitals:   07/26/19 0827 07/26/19 1109  BP:  (!) 141/72  Pulse:  90  Resp:  17  Temp:  97.8 F (36.6 C)  SpO2: 95% 94%   Vitals:   07/26/19 0512 07/26/19 0745 07/26/19 0827 07/26/19 1109  BP: (!) 156/73 (!) 161/88  (!) 141/72  Pulse: 79 82  90  Resp: 20 16  17   Temp: 98.5 F (36.9 C) 98 F (36.7 C)  97.8 F (36.6 C)  TempSrc: Oral Oral    SpO2: 94% 99% 95% 94%  Weight:      Height:        General exam: Appears anxious but not uncomfortable.  Respiratory system: Clear to auscultation. Respiratory effort normal. Cardiovascular system: S1 & S2 heard, RRR. No JVD,  rubs, gallops or clicks. No murmurs. Gastrointestinal system: Abdomen is nondistended, soft and  nontender. No organomegaly or masses felt. Normal bowel sounds heard. Central nervous system: Alert and oriented. No focal neurological deficits. Extremities: No clubbing,  or cyanosis. No edema. Skin: No rashes, lesions or ulcers. Psychiatry:  Quite anxious. very HOH. .    The results of significant diagnostics from this hospitalization (including imaging, microbiology, ancillary and laboratory) are listed below for reference.     Procedures and Diagnostic Studies:   DG Chest 1 View  Result Date: 07/25/2019 CLINICAL DATA:  Shortness of breath EXAM: CHEST  1 VIEW COMPARISON:  Two days ago FINDINGS: Chronic left pleural effusion with volume loss and pleural thickening by recent CT. Cardiomegaly. There is no edema, consolidation, or pneumothorax. Remote right mid clavicle fracture IMPRESSION: 1. Chronic and loculated left pleural effusion. 2. Stable aeration. Electronically Signed   By: Monte Fantasia M.D.   On: 07/25/2019 06:04   ECHOCARDIOGRAM COMPLETE  Result Date: 07/24/2019    ECHOCARDIOGRAM REPORT   Patient Name:   BENTON TOOKER Date of Exam: 07/24/2019  Medical Rec #:  [id number]    Height:       70.0 in Accession #:    [id number]   Weight:       207.2 lb Date of Birth:  October 06, 1923     BSA:          2.119 m Patient Age:    70 years     BP:           166/77 mmHg Patient Gender: M            HR:           101 bpm. Exam Location:  [facility] Procedure: 2D Echo, Color Doppler and Cardiac Doppler Indications:     I50.31 CHF-Acute Diastolic  History:         Patient has prior history of Echocardiogram examinations.                  HFpEF, [name]; Risk Factors:Sleep Apnea, Hypertension and                  Dyslipidemia.  Sonographer:     Charmayne Sheer RDCS (AE) Referring Phys:  [id number] [place] Diagnosing Phys: Kathlyn Sacramento MD  Sonographer Comments: Technically difficult study due to poor echo windows. Image acquisition challenging due to patient body habitus. IMPRESSIONS  1. Left ventricular ejection fraction, by  estimation, is 55 to 60%. The left ventricle has normal function. Left ventricular endocardial border not optimally defined to evaluate regional wall motion. Left ventricular diastolic parameters are indeterminate.  2. Right ventricular systolic function is normal. The right ventricular size is normal. Tricuspid regurgitation signal is inadequate for assessing PA pressure.  3. The mitral valve is normal in structure. No evidence of mitral valve regurgitation. No evidence of mitral stenosis.  4. The aortic valve is normal in structure. Aortic valve regurgitation is not visualized. Mild aortic valve sclerosis is present, with no evidence of aortic valve stenosis.  5. Technically difficult study due to poor echo windows. FINDINGS  Left Ventricle: Left ventricular ejection fraction, by estimation, is 55 to 60%. The left ventricle has normal function. Left ventricular endocardial border not optimally defined to evaluate regional wall motion. The left ventricular internal cavity size was normal in size. There is no left ventricular hypertrophy. Left ventricular diastolic parameters are indeterminate. Right Ventricle: The right ventricular size is normal. No increase in right ventricular wall thickness. Right ventricular systolic function is normal. Tricuspid regurgitation signal is inadequate for assessing PA pressure. Left Atrium: Left atrial size was normal in size. Right Atrium: Right atrial size was normal in size. Pericardium: There is no evidence of pericardial effusion. Mitral Valve: The mitral valve is normal in structure. Normal mobility of the mitral valve leaflets. No evidence of mitral valve regurgitation. No evidence of mitral valve stenosis. MV peak gradient, 7.0 mmHg. The mean mitral valve gradient is 4.0 mmHg. Tricuspid Valve: The tricuspid valve is normal in structure. Tricuspid valve regurgitation is not demonstrated. No evidence of tricuspid stenosis. Aortic Valve: The aortic valve is normal in  structure. Aortic valve regurgitation is not visualized. Mild aortic valve sclerosis is present, with no evidence of aortic valve stenosis. Aortic valve mean gradient measures 4.0 mmHg. Aortic valve peak gradient measures 6.7 mmHg. Aortic valve area, by VTI measures 2.58 cm. Pulmonic Valve: The pulmonic valve was normal in structure. Pulmonic valve regurgitation is not visualized. No evidence of pulmonic stenosis. Aorta: The aortic root is normal in size and structure. Venous: The inferior  vena cava was not well visualized. IAS/Shunts: No atrial level shunt detected by color flow Doppler.  LEFT VENTRICLE PLAX 2D LVIDd:         3.29 cm  Diastology LVIDs:         2.81 cm  LV e' lateral:   7.07 cm/s LV PW:         0.95 cm  LV E/e' lateral: 15.7 LV IVS:        0.93 cm  LV e' medial:    8.59 cm/s LVOT diam:     2.30 cm  LV E/e' medial:  12.9 LV SV:         60 LV SV Index:   28 LVOT Area:     4.15 cm  LEFT ATRIUM         Index LA diam:    3.00 cm 1.42 cm/m  AORTIC VALVE                   PULMONIC VALVE AV Area (Vmax):    2.59 cm    PV Vmax:       0.85 m/s AV Area (Vmean):   2.25 cm    PV Vmean:      58.000 cm/s AV Area (VTI):     2.58 cm    PV VTI:        0.157 m AV Vmax:           129.00 cm/s PV Peak grad:  2.9 mmHg AV Vmean:          97.100 cm/s PV Mean grad:  2.0 mmHg AV VTI:            0.232 m AV Peak Grad:      6.7 mmHg AV Mean Grad:      4.0 mmHg LVOT Vmax:         80.30 cm/s LVOT Vmean:        52.500 cm/s LVOT VTI:          0.144 m LVOT/AV VTI ratio: 0.62  AORTA Ao Root diam: 3.60 cm MITRAL VALVE MV Area (PHT): 5.58 cm     SHUNTS MV Peak grad:  7.0 mmHg     Systemic VTI:  0.14 m MV Mean grad:  4.0 mmHg     Systemic Diam: 2.30 cm MV Vmax:       1.32 m/s MV Vmean:      91.2 cm/s MV Decel Time: 136 msec MV E velocity: 111.00 cm/s MV A velocity: 109.00 cm/s MV E/A ratio:  1.02 Kathlyn Sacramento MD Electronically signed by Kathlyn Sacramento MD Signature Date/Time: 07/24/2019/4:50:19 PM    Final      Labs:   Basic  Metabolic Panel: Recent Labs  Lab 07/23/19 1923 07/23/19 1923 07/24/19 0313 07/24/19 0313 07/25/19 0449 07/26/19 0605  NA 134*  --  136  --  136 137  K 4.7   < > 4.3   < > 3.9 3.8  CL 101  --  102  --  100 100  CO2 23  --  28  --  25 25  GLUCOSE 101*  --  123*  --  115* 130*  BUN 35*  --  36*  --  30* 31*  CREATININE 1.73*  --  1.82*  --  1.60* 1.45*  CALCIUM 9.5  --  9.1  --  9.0 9.0  MG  --   --   --   --  2.2  --   PHOS  --   --   --   --  2.5  --    < > = values in this interval not displayed.   GFR Estimated Creatinine Clearance: 36.3 mL/min (A) (by C-G formula based on SCr of 1.45 mg/dL (H)). Liver Function Tests: Recent Labs  Lab 07/23/19 1923 07/25/19 0449  AST 23 34  ALT 17 16  ALKPHOS 99 101  BILITOT 0.7 0.8  PROT 8.0 7.7  ALBUMIN 3.6 3.1*   No results for input(s): LIPASE, AMYLASE in the last 168 hours. No results for input(s): AMMONIA in the last 168 hours. Coagulation profile No results for input(s): INR, PROTIME in the last 168 hours.  CBC: Recent Labs  Lab 07/23/19 1923 07/24/19 0313 07/25/19 0449 07/26/19 0605  WBC 19.5* 22.3* 17.3* 18.7*  NEUTROABS 17.0*  --  13.7*  --   HGB 11.3* 10.2* 11.0* 11.8*  HCT 35.1* 30.7* 33.9* 35.7*  MCV 89.8 87.7 89.0 86.7  PLT 161 148* 140* 182   Cardiac Enzymes: No results for input(s): CKTOTAL, CKMB, CKMBINDEX, TROPONINI in the last 168 hours. BNP: Invalid input(s): POCBNP CBG: No results for input(s): GLUCAP in the last 168 hours. D-Dimer No results for input(s): DDIMER in the last 72 hours. Hgb A1c No results for input(s): HGBA1C in the last 72 hours. Lipid Profile No results for input(s): CHOL, HDL, LDLCALC, TRIG, CHOLHDL, LDLDIRECT in the last 72 hours. Thyroid function studies No results for input(s): TSH, T4TOTAL, T3FREE, THYROIDAB in the last 72 hours.  Invalid input(s): FREET3 Anemia work up No results for input(s): VITAMINB12, FOLATE, FERRITIN, TIBC, IRON, RETICCTPCT in the last 72  hours. Microbiology Recent Results (from the past 240 hour(s))  Culture, blood (routine x 2)     Status: None (Preliminary result)   Collection Time: 07/23/19  7:23 PM   Specimen: BLOOD  Result Value Ref Range Status   Specimen Description BLOOD RIGHT ANTECUBITAL  Final   Special Requests   Final    BOTTLES DRAWN AEROBIC AND ANAEROBIC Blood Culture results may not be optimal due to an excessive volume of blood received in culture bottles   Culture   Final    NO GROWTH 3 DAYS Performed at [facility], [address], [place], [place] [address]    Report Status PENDING  Incomplete  Culture, blood (routine x 2)     Status: None (Preliminary result)   Collection Time: 07/23/19  8:22 PM   Specimen: BLOOD  Result Value Ref Range Status   Specimen Description BLOOD BLOOD LEFT HAND  Final   Special Requests   Final    BOTTLES DRAWN AEROBIC AND ANAEROBIC Blood Culture results may not be optimal due to an excessive volume of blood received in culture bottles   Culture   Final    NO GROWTH 3 DAYS Performed at [facility], [address], [place], [place] [address]    Report Status PENDING  Incomplete  Urine culture     Status: Abnormal   Collection Time: 07/23/19  8:22 PM   Specimen: Urine, Random  Result Value Ref Range Status   Specimen Description   Final    URINE, RANDOM Performed at [facility], [address], [place], [place] [address]    Special Requests   Final    NONE Performed at [facility], [address] [place], [place] [address]    Culture >=100,000 COLONIES/mL ESCHERICHIA COLI (A)  Final   Report Status 07/25/2019 FINAL  Final   Organism ID, Bacteria ESCHERICHIA COLI (A)  Final  Susceptibility   Escherichia coli - MIC*    AMPICILLIN >=32 RESISTANT Resistant     CEFAZOLIN <=4 SENSITIVE Sensitive     CEFTRIAXONE <=1 SENSITIVE Sensitive     CIPROFLOXACIN <=0.25 SENSITIVE Sensitive     GENTAMICIN <=1 SENSITIVE  Sensitive     IMIPENEM <=0.25 SENSITIVE Sensitive     NITROFURANTOIN <=16 SENSITIVE Sensitive     TRIMETH/SULFA <=20 SENSITIVE Sensitive     AMPICILLIN/SULBACTAM 8 SENSITIVE Sensitive     PIP/TAZO <=4 SENSITIVE Sensitive     * >=100,000 COLONIES/mL ESCHERICHIA COLI  SARS Coronavirus 2 by RT PCR (hospital order, performed in [facility] hospital lab) Nasopharyngeal Nasopharyngeal Swab     Status: None   Collection Time: 07/23/19  8:22 PM   Specimen: Nasopharyngeal Swab  Result Value Ref Range Status   SARS Coronavirus 2 NEGATIVE NEGATIVE Final    Comment: (NOTE) SARS-CoV-2 target nucleic acids are NOT DETECTED. The SARS-CoV-2 RNA is generally detectable in upper and lower respiratory specimens during the acute phase of infection. The lowest concentration of SARS-CoV-2 viral copies this assay can detect is 250 copies / mL. A negative result does not preclude SARS-CoV-2 infection and should not be used as the sole basis for treatment or other patient management decisions.  A negative result may occur with improper specimen collection / handling, submission of specimen other than nasopharyngeal swab, presence of viral mutation(s) within the areas targeted by this assay, and inadequate number of viral copies (<250 copies / mL). A negative result must be combined with clinical observations, patient history, and epidemiological information. Fact Sheet for Patients:   StrictlyIdeas.no Fact Sheet for Healthcare Providers: BankingDealers.co.za This test is not yet approved or cleared  by the [place] FDA and has been authorized for detection and/or diagnosis of SARS-CoV-2 by FDA under an Emergency Use Authorization (EUA).  This EUA will remain in effect (meaning this test can be used) for the duration of the COVID-19 declaration under Section 564(b)(1) of the Act, 21 U.S.C. section 360bbb-3(b)(1), unless the authorization is terminated  or revoked sooner. Performed at [facility], [address] [place], [place] [address]   MRSA PCR Screening     Status: None   Collection Time: 07/24/19  8:20 PM   Specimen: Nasal Mucosa; Nasopharyngeal  Result Value Ref Range Status   MRSA by PCR NEGATIVE NEGATIVE Final    Comment:        The GeneXpert MRSA Assay (FDA approved for NASAL specimens only), is one component of a comprehensive MRSA colonization surveillance program. It is not intended to diagnose MRSA infection nor to guide or monitor treatment for MRSA infections. Performed at [facility], [address] [place], [place] [address]      Discharge Instructions:   Discharge Instructions    Diet - low sodium heart healthy   Complete by: As directed    Discharge instructions   Complete by: As directed    Take meds as directed Home health PT RN and palliative care ordered   Increase activity slowly   Complete by: As directed      Allergies as of 07/26/2019      Reactions   Cyclobenzaprine Other (See Comments)   Hallucination. Mental instability.  DO NOT GIVE ANY MUSCLE RELAXANTS   Other Other (See Comments)   Sulfamethoxazole Other (See Comments)   Baclofen Other (See Comments)   Confusion and weakness   Nsaids    Avoids due to liver.   Sulfamethoxazole-trimethoprim Nausea Only,  Other (See Comments)   Patient unaware of this as an allergy   Tolmetin Other (See Comments)   Avoids due to liver.it is an nsaid      Medication List    TAKE these medications   acetaminophen 500 MG tablet Commonly known as: TYLENOL Take 1,000 mg by mouth 3 (three) times daily.   acidophilus Caps capsule Take 1 capsule by mouth daily.   albuterol 108 (90 Base) MCG/ACT inhaler Commonly known as: VENTOLIN HFA Inhale 2 puffs into the lungs every 6 (six) hours.   antiseptic oral rinse Liqd 15 mLs by Mouth Rinse route 2 (two) times daily as needed for dry mouth.   ascorbic acid 500 MG  tablet Commonly known as: VITAMIN C Take 1 tablet (500 mg total) by mouth daily.   Aspercreme Lidocaine 4 % Ptch Generic drug: Lidocaine Apply 1 patch topically 2 (two) times daily.   cefUROXime 500 MG tablet Commonly known as: CEFTIN Take 1 tablet (500 mg total) by mouth 2 (two) times daily with a meal. Start taking on: July 27, 2019   clonazePAM 0.5 MG tablet Commonly known as: KLONOPIN Take 0.5 mg by mouth in the morning, at noon, and at bedtime.   diclofenac sodium 1 % Gel Commonly known as: VOLTAREN Apply 2 g topically 2 (two) times daily as needed (lower back pain). (use when lidocaine patch is off)   docusate sodium 100 MG capsule Commonly known as: COLACE Take 100 mg by mouth 2 (two) times daily.   doxazosin 1 MG tablet Commonly known as: CARDURA Take 1 tablet (1 mg total) by mouth daily.   Eliquis 2.5 MG Tabs tablet Generic drug: apixaban Take 2.5 mg by mouth 2 (two) times daily.   escitalopram 20 MG tablet Commonly known as: LEXAPRO Take 20 mg by mouth daily.   gabapentin 300 MG capsule Commonly known as: NEURONTIN Take 300 mg by mouth at bedtime.   guaiFENesin 100 MG/5ML liquid Commonly known as: ROBITUSSIN Take 200 mg by mouth every 6 (six) hours as needed for cough.   hydrocortisone 2.5 % cream Apply 1 application topically every other day. (apply to scalp and alternate with ketoconazole)   isosorbide mononitrate 30 MG 24 hr tablet Commonly known as: IMDUR Take 1 tablet (30 mg total) by mouth daily.   LACTOBACILLUS PO Take by mouth daily.   lidocaine 2 % solution Commonly known as: XYLOCAINE Use as directed 5 mLs in the mouth or throat 3 (three) times daily.   loperamide 2 MG capsule Commonly known as: IMODIUM Take 2 mg by mouth as needed for diarrhea or loose stools. (max 8 doses in 24 hours)   Lutein 10 MG Tabs Take 10 mg by mouth 2 (two) times daily.   magic mouthwash Soln Take 10 mLs by mouth 3 (three) times daily as needed for mouth  pain. What changed: when to take this   melatonin 5 MG Tabs Take 5 mg by mouth at bedtime as needed (sleep).   metoprolol tartrate 25 MG tablet Commonly known as: LOPRESSOR Take 12.5 mg by mouth 2 (two) times daily.   Mintox 557-322-02 MG/5ML suspension Generic drug: alum & mag hydroxide-simeth Take 30 mLs by mouth every 6 (six) hours as needed for indigestion or heartburn.   multivitamins with iron Tabs tablet Take 1 tablet by mouth daily.   nitroGLYCERIN 0.4 MG SL tablet Commonly known as: NITROSTAT Place 0.4 mg under the tongue every 5 (five) minutes as needed for chest pain.   ondansetron  4 MG tablet Commonly known as: ZOFRAN Take 4 mg by mouth every 6 (six) hours as needed for nausea or vomiting.   pantoprazole 40 MG tablet Commonly known as: PROTONIX Take 1 tablet (40 mg total) by mouth daily.   polyethylene glycol 17 g packet Commonly known as: MIRALAX / GLYCOLAX Take 17 g by mouth daily as needed for mild constipation or moderate constipation.   pravastatin 40 MG tablet Commonly known as: PRAVACHOL Take 40 mg by mouth at bedtime.   PREPARATION H RE Place 1 application rectally 4 (four) times daily as needed (rectal pain/itching).   senna 8.6 MG Tabs tablet Commonly known as: SENOKOT Take 1 tablet by mouth at bedtime.   torsemide 20 MG tablet Commonly known as: DEMADEX Take 1 tablet (20 mg) by mouth once a day on Mondays/ Wednesdays/ & Fridays What changed:   how much to take  how to take this  when to take this  additional instructions   traMADol 50 MG tablet Commonly known as: Ultram Take 1 tablet (50 mg total) by mouth every 8 (eight) hours as needed. Patient to carry to his room   triamcinolone cream 0.1 % Commonly known as: KENALOG Apply 1 application topically 2 (two) times daily.   Vitamin D3 125 MCG (5000 UT) Caps Take 5,000 Units by mouth daily.   zinc sulfate 220 (50 Zn) MG capsule Take 220 mg by mouth daily.         Time  coordinating discharge: 4  Signed:  Radene Gunning NP  [facility] 07/26/2019, 12:04 PM

## 2019-07-26 NOTE — Progress Notes (Addendum)
Micha L Copland to be D/C'd Skilled nursing facility per MD order.  Discussed prescriptions and follow up appointments with the patient. Prescriptions given to patients family and also electronically submitted  Pt verbalized understanding.  Allergies as of 07/26/2019      Reactions   Cyclobenzaprine Other (See Comments)   Hallucination. Mental instability.  DO NOT GIVE ANY MUSCLE RELAXANTS   Other Other (See Comments)   Sulfamethoxazole Other (See Comments)   Baclofen Other (See Comments)   Confusion and weakness   Nsaids    Avoids due to liver.   Sulfamethoxazole-trimethoprim Nausea Only, Other (See Comments)   Patient unaware of this as an allergy   Tolmetin Other (See Comments)   Avoids due to liver.it is an nsaid      Medication List    TAKE these medications   acetaminophen 500 MG tablet Commonly known as: TYLENOL Take 1,000 mg by mouth 3 (three) times daily.   acidophilus Caps capsule Take 1 capsule by mouth daily.   albuterol 108 (90 Base) MCG/ACT inhaler Commonly known as: VENTOLIN HFA Inhale 2 puffs into the lungs every 6 (six) hours.   antiseptic oral rinse Liqd 15 mLs by Mouth Rinse route 2 (two) times daily as needed for dry mouth.   ascorbic acid 500 MG tablet Commonly known as: VITAMIN C Take 1 tablet (500 mg total) by mouth daily.   Aspercreme Lidocaine 4 % Ptch Generic drug: Lidocaine Apply 1 patch topically 2 (two) times daily.   cefUROXime 500 MG tablet Commonly known as: CEFTIN Take 1 tablet (500 mg total) by mouth 2 (two) times daily with a meal. Start taking on: July 27, 2019   clonazePAM 0.5 MG tablet Commonly known as: KLONOPIN Take 0.5 mg by mouth in the morning, at noon, and at bedtime.   diclofenac sodium 1 % Gel Commonly known as: VOLTAREN Apply 2 g topically 2 (two) times daily as needed (lower back pain). (use when lidocaine patch is off)   docusate sodium 100 MG capsule Commonly known as: COLACE Take 100 mg by mouth 2 (two) times  daily.   doxazosin 1 MG tablet Commonly known as: CARDURA Take 1 tablet (1 mg total) by mouth daily.   Eliquis 2.5 MG Tabs tablet Generic drug: apixaban Take 2.5 mg by mouth 2 (two) times daily.   escitalopram 20 MG tablet Commonly known as: LEXAPRO Take 20 mg by mouth daily.   gabapentin 300 MG capsule Commonly known as: NEURONTIN Take 300 mg by mouth at bedtime.   guaiFENesin 100 MG/5ML liquid Commonly known as: ROBITUSSIN Take 200 mg by mouth every 6 (six) hours as needed for cough.   hydrocortisone 2.5 % cream Apply 1 application topically every other day. (apply to scalp and alternate with ketoconazole)   isosorbide mononitrate 30 MG 24 hr tablet Commonly known as: IMDUR Take 1 tablet (30 mg total) by mouth daily.   LACTOBACILLUS PO Take by mouth daily.   lidocaine 2 % solution Commonly known as: XYLOCAINE Use as directed 5 mLs in the mouth or throat 3 (three) times daily.   loperamide 2 MG capsule Commonly known as: IMODIUM Take 2 mg by mouth as needed for diarrhea or loose stools. (max 8 doses in 24 hours)   Lutein 10 MG Tabs Take 10 mg by mouth 2 (two) times daily.   magic mouthwash Soln Take 10 mLs by mouth 3 (three) times daily as needed for mouth pain. What changed: when to take this   melatonin 5  MG Tabs Take 5 mg by mouth at bedtime as needed (sleep).   metoprolol tartrate 25 MG tablet Commonly known as: LOPRESSOR Take 12.5 mg by mouth 2 (two) times daily.   Mintox 834-196-22 MG/5ML suspension Generic drug: alum & mag hydroxide-simeth Take 30 mLs by mouth every 6 (six) hours as needed for indigestion or heartburn.   multivitamins with iron Tabs tablet Take 1 tablet by mouth daily.   nitroGLYCERIN 0.4 MG SL tablet Commonly known as: NITROSTAT Place 0.4 mg under the tongue every 5 (five) minutes as needed for chest pain.   ondansetron 4 MG tablet Commonly known as: ZOFRAN Take 4 mg by mouth every 6 (six) hours as needed for nausea or  vomiting.   pantoprazole 40 MG tablet Commonly known as: PROTONIX Take 1 tablet (40 mg total) by mouth daily.   polyethylene glycol 17 g packet Commonly known as: MIRALAX / GLYCOLAX Take 17 g by mouth daily as needed for mild constipation or moderate constipation.   pravastatin 40 MG tablet Commonly known as: PRAVACHOL Take 40 mg by mouth at bedtime.   PREPARATION H RE Place 1 application rectally 4 (four) times daily as needed (rectal pain/itching).   senna 8.6 MG Tabs tablet Commonly known as: SENOKOT Take 1 tablet by mouth at bedtime.   torsemide 20 MG tablet Commonly known as: DEMADEX Take 1 tablet (20 mg) by mouth once a day on Mondays/ Wednesdays/ & Fridays What changed:   how much to take  how to take this  when to take this  additional instructions   traMADol 50 MG tablet Commonly known as: Ultram Take 1 tablet (50 mg total) by mouth every 8 (eight) hours as needed. Patient to carry to his room   triamcinolone cream 0.1 % Commonly known as: KENALOG Apply 1 application topically 2 (two) times daily.   Vitamin D3 125 MCG (5000 UT) Caps Take 5,000 Units by mouth daily.   zinc sulfate 220 (50 Zn) MG capsule Take 220 mg by mouth daily.       Vitals:   07/26/19 1444 07/26/19 1611  BP:  (!) 148/81  Pulse:  87  Resp:  17  Temp:  99 F (37.2 C)  SpO2: 95% 95%    Skin clean, dry and intact without evidence of skin break down, no evidence of skin tears noted. IV catheter discontinued intact. Site without signs and symptoms of complications. Dressing and pressure applied. Pt denies pain at this time. No complaints noted.  An After Visit Summary was printed and placed in packet Patient escorted via stretcher by ems.  [facility] on floor to transport patient. Patient to go on RA. No distress noted. Packet given to ems. Report called to [place] [facility].

## 2019-07-26 NOTE — Care Management Important Message (Signed)
Important Message  Patient Details  Name: [name] MRN: [id number] Date of Birth: [birth year]   Medicare Important Message Given:  Yes     Dannette Barbara 07/26/2019, 11:45 AM

## 2019-07-27 ENCOUNTER — Ambulatory Visit: Payer: Medicare Other | Admitting: Nurse Practitioner

## 2019-07-28 ENCOUNTER — Other Ambulatory Visit: Payer: Self-pay

## 2019-07-28 ENCOUNTER — Non-Acute Institutional Stay: Payer: Medicare Other | Admitting: Adult Health Nurse Practitioner

## 2019-07-28 DIAGNOSIS — I5033 Acute on chronic diastolic (congestive) heart failure: Secondary | ICD-10-CM

## 2019-07-28 DIAGNOSIS — Z515 Encounter for palliative care: Secondary | ICD-10-CM

## 2019-07-28 LAB — CULTURE, BLOOD (ROUTINE X 2)
Culture: NO GROWTH
Culture: NO GROWTH

## 2019-07-29 NOTE — Progress Notes (Signed)
[facility] Consult Note Telephone: [phone]  Fax: [phone]  PATIENT NAME: [name] DOB: [birth year] MRN: [id number]  PRIMARY CARE PROVIDER:   [name]  REFERRING PROVIDER:  Thea Gist NP  RESPONSIBLE PARTY:   [name], son/POA [phone] [name], wife [name], daughter [phone]    RECOMMENDATIONS and PLAN:  1.  Advanced care planning. Patient is a DNR.  Will call son to update on visit  2.  Patient had hospitalization 6/6-07/26/19 for sepsis due to UTI and pneumonia. Patient has functional decline and weakness since coming back to facility.  He uses wheelchair to get around. He can use walker only with assistance.  Requires assistance to use bathroom, bathing, dressing.   He has to hold onto safety bars when standing in the bathroom.  States feeling weak and has had initial evaluation with home health PT.  Would like to continue with PT to help with his weakness.  He and wife stated that he does not want to be treated if infection occurs again.  Would like to focus on comfort measures. Does state that his appetite has decreased and eats about 50-75% of his meals.  No reported weight loss and most current weight is 207 pounds. Denies increased SOB or cough, chest pain, headaches, dizziness, edema, N/V/D, constipation.  States that he sleeps well.  Continue supportive care at facility and work with physical therapy as ordered.  Palliative will continue to monitor for symptom management/decline and make recommendations as needed.  Will follow up in 4-6weeks.  I spent 35 minutes providing this consultation,  from 10:30 to 11:05 including time with patient/family, chart review, provider coordination, and documentation. More than 50% of the time in this consultation was spent coordinating communication.   HISTORY OF PRESENT ILLNESS:  [name] is a 86 y.o. year old male with multiple medical  problems including HF with preserved EF, BPH, carotid stenosis, CKD, [name], chronic back pain, anxiety, h/o [name].patient was treated at hospital for [place] 2/8-2/01/2020. Palliative Care was asked to help address goals of care.   CODE STATUS: DNR  PPS: 40% HOSPICE ELIGIBILITY/DIAGNOSIS: TBD  PHYSICAL EXAM:  BP 130/64  HR  81  O2 95% on RA General: NAD, frail appearing Cardiovascular: regular rate and rhythm Pulmonary: lung sounds clear; normal respiratory effort Abdomen: soft, nontender, + bowel sounds GU: no suprapubic tenderness Extremities: no edema, no joint deformities Skin: no rashes on exposed skin Neurological: Weakness but otherwise nonfocal   PAST MEDICAL HISTORY:  Past Medical History:  Diagnosis Date  . (HFpEF) heart failure with preserved ejection fraction ([place])    a. 11/2017 Echo: EF 60-65%, no rwma, GR1 DD. Nl RV fxn.  . Anginal pain ([place])    c/o heaviness a few years ago.  stent placed and all resolved  . Anxiety   . BPH (benign prostatic hypertrophy)   . Carotid stenosis    a. 05/2015 Carotid U/S: mild nonobs atherosclerosis. No need for f/u.  Marland Kitchen Chronic back pain   . Chronic Left Pleural Effusion    a. 11/2017 Mod by CXR.  . CKD (chronic kidney disease), stage III   . Coronary artery disease    a. 06/2013 Cath/PCI: LAD 50-70p (FFR 0.82-->PCI w/ 3.5x24 Promus DES), RCA subtotally occluded w/ L->R collats, LCX 30ost.  . Dyslipidemia   . GERD (gastroesophageal reflux disease)   . Hx of [name]   . Hyperlipidemia   . Hypertension   .  Inguinal hernia   . Lightheadedness    a. chronic, somewhat positional.  . 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 Major depression   . Sleep apnea    will not use cpap    SOCIAL HX:  Social History   Tobacco Use  . Smoking status: Former Smoker    Packs/day: 2.00    Years: 7.00    Pack years: 14.00    Types: Cigarettes    Quit date: 09/17/1953     Years since quitting: 65.9  . Smokeless tobacco: Never Used  Substance Use Topics  . Alcohol use: No    Alcohol/week: 0.0 standard drinks    ALLERGIES:  Allergies  Allergen Reactions  . Cyclobenzaprine Other (See Comments)    Hallucination. Mental instability.  DO NOT GIVE ANY MUSCLE RELAXANTS  . Other Other (See Comments)  . Sulfamethoxazole Other (See Comments)  . Baclofen Other (See Comments)    Confusion and weakness  . Nsaids     Avoids due to liver.  . Sulfamethoxazole-Trimethoprim Nausea Only and Other (See Comments)    Patient unaware of this as an allergy  . Tolmetin Other (See Comments)    Avoids due to liver.it is an nsaid     PERTINENT MEDICATIONS:  Outpatient Encounter Medications as of 07/28/2019  Medication Sig  . acetaminophen (TYLENOL) 500 MG tablet Take 1,000 mg by mouth 3 (three) times daily.  Marland Kitchen acidophilus (RISAQUAD) CAPS capsule Take 1 capsule by mouth daily.  Marland Kitchen albuterol (VENTOLIN HFA) 108 (90 Base) MCG/ACT inhaler Inhale 2 puffs into the lungs every 6 (six) hours.  Marland Kitchen alum & mag hydroxide-simeth (MINTOX) 347-425-95 MG/5ML suspension Take 30 mLs by mouth every 6 (six) hours as needed for indigestion or heartburn.  Marland Kitchen antiseptic oral rinse (BIOTENE) LIQD 15 mLs by Mouth Rinse route 2 (two) times daily as needed for dry mouth.  Marland Kitchen apixaban (ELIQUIS) 2.5 MG TABS tablet Take 2.5 mg by mouth 2 (two) times daily.  Marland Kitchen ascorbic acid (VITAMIN C) 500 MG tablet Take 1 tablet (500 mg total) by mouth daily.  . cefUROXime (CEFTIN) 500 MG tablet Take 1 tablet (500 mg total) by mouth 2 (two) times daily with a meal.  . Cholecalciferol (VITAMIN D3) 5000 units CAPS Take 5,000 Units by mouth daily.   . clonazePAM (KLONOPIN) 0.5 MG tablet Take 0.5 mg by mouth in the morning, at noon, and at bedtime.   . diclofenac sodium (VOLTAREN) 1 % GEL Apply 2 g topically 2 (two) times daily as needed (lower back pain). (use when lidocaine patch is off)  . docusate sodium (COLACE) 100 MG  capsule Take 100 mg by mouth 2 (two) times daily.  Marland Kitchen doxazosin (CARDURA) 1 MG tablet Take 1 tablet (1 mg total) by mouth daily.  Marland Kitchen escitalopram (LEXAPRO) 20 MG tablet Take 20 mg by mouth daily.   Marland Kitchen gabapentin (NEURONTIN) 300 MG capsule Take 300 mg by mouth at bedtime.   Marland Kitchen guaiFENesin (ROBITUSSIN) 100 MG/5ML liquid Take 200 mg by mouth every 6 (six) hours as needed for cough.   . hydrocortisone 2.5 % cream Apply 1 application topically every other day. (apply to scalp and alternate with ketoconazole)  . isosorbide mononitrate (IMDUR) 30 MG 24 hr tablet Take 1 tablet (30 mg total) by mouth daily.  Marland Kitchen LACTOBACILLUS PO Take by mouth daily.  . Lidocaine (ASPERCREME LIDOCAINE) 4 % PTCH Apply 1 patch topically 2 (two) times  daily.  . lidocaine (XYLOCAINE) 2 % solution Use as directed 5 mLs in the mouth or throat 3 (three) times daily.  Marland Kitchen loperamide (IMODIUM) 2 MG capsule Take 2 mg by mouth as needed for diarrhea or loose stools. (max 8 doses in 24 hours)  . Lutein 10 MG TABS Take 10 mg by mouth 2 (two) times daily.  . magic mouthwash SOLN Take 10 mLs by mouth 3 (three) times daily as needed for mouth pain. (Patient taking differently: Take 10 mLs by mouth 4 (four) times daily as needed for mouth pain. )  . melatonin 5 MG TABS Take 5 mg by mouth at bedtime as needed (sleep).   . metoprolol tartrate (LOPRESSOR) 25 MG tablet Take 12.5 mg by mouth 2 (two) times daily.   . Multiple Vitamins-Iron (MULTIVITAMINS WITH IRON) TABS tablet Take 1 tablet by mouth daily.  . nitroGLYCERIN (NITROSTAT) 0.4 MG SL tablet Place 0.4 mg under the tongue every 5 (five) minutes as needed for chest pain.  Marland Kitchen ondansetron (ZOFRAN) 4 MG tablet Take 4 mg by mouth every 6 (six) hours as needed for nausea or vomiting.  . pantoprazole (PROTONIX) 40 MG tablet Take 1 tablet (40 mg total) by mouth daily.  Marland Kitchen Phenylephrine-Cocoa Butter (PREPARATION H RE) Place 1 application rectally 4 (four) times daily as needed (rectal pain/itching).   .  polyethylene glycol (MIRALAX / GLYCOLAX) packet Take 17 g by mouth daily as needed for mild constipation or moderate constipation.   . pravastatin (PRAVACHOL) 40 MG tablet Take 40 mg by mouth at bedtime.  . senna (SENOKOT) 8.6 MG TABS tablet Take 1 tablet by mouth at bedtime.   . torsemide (DEMADEX) 20 MG tablet Take 1 tablet (20 mg) by mouth once a day on Mondays/ Wednesdays/ & Fridays (Patient taking differently: Take 20 mg by mouth every Monday, Wednesday, and Friday. )  . traMADol (ULTRAM) 50 MG tablet Take 1 tablet (50 mg total) by mouth every 8 (eight) hours as needed. Patient to carry to his room  . triamcinolone cream (KENALOG) 0.1 % Apply 1 application topically 2 (two) times daily.  Marland Kitchen zinc sulfate 220 (50 Zn) MG capsule Take 220 mg by mouth daily.   No facility-administered encounter medications on file as of 07/28/2019.     Marquetta Weiskopf Jenetta Downer, NP

## 2019-07-31 ENCOUNTER — Telehealth: Payer: Self-pay | Admitting: Adult Health Nurse Practitioner

## 2019-07-31 NOTE — Telephone Encounter (Signed)
Spoke with son about visit on Friday.  Did express that they would not like him sent to hospital if infection occurs again.  Would like nature to takes its course.  He is a DNR.  Encouraged to have copy of living will at facility.  Discussed hospice services when patient eligible.  Patient currently working with PT.  Will reach out to PCP with [facility] with medical wishes to make sure it is documented in his chart to call son first before sending to hospital. Amy K. Olena Heckle NP

## 2019-09-01 ENCOUNTER — Ambulatory Visit: Payer: Medicare Other | Admitting: Nurse Practitioner

## 2019-09-13 ENCOUNTER — Other Ambulatory Visit: Payer: Self-pay

## 2019-09-13 ENCOUNTER — Non-Acute Institutional Stay: Payer: Medicare Other | Admitting: Adult Health Nurse Practitioner

## 2019-09-13 DIAGNOSIS — I5033 Acute on chronic diastolic (congestive) heart failure: Secondary | ICD-10-CM

## 2019-09-13 DIAGNOSIS — Z515 Encounter for palliative care: Secondary | ICD-10-CM

## 2019-09-13 NOTE — Progress Notes (Signed)
[facility] Consult Note Telephone: [phone]  Fax: [phone]  PATIENT NAME: [name] DOB: [birth year] MRN: [id number]  PRIMARY CARE PROVIDER:   Thea Gist NP  RESPONSIBLE PARTY:   Shawnie Dapper, son/POA [phone] Jagar Lua, wife Flowella, daughter [phone]    RECOMMENDATIONS and PLAN:  1.  Advanced care planning. Patient is a DNR.  Will call son to update on visit  2.  Went to see patient today and he and wife were confused as to why an oxygen concentrator and oxygen tanks were being brought to the room.  Denies any increased SOB or cough, edema, chest pain.  Does state he thinks a hospice aide has been coming and providing personal care.  He and wife are unsure if he is on hospice services.  States that someone from [facility] had talked with them and that a chaplain and SW were scheduled to visit.  Explained to them that these are hospice providers but it was not through [employer].  Explained that while under hospice services I could no longer have provider visits with him.  They expressed understanding.  Did confirm with staff that he is under [facility]  Will discharge from our care.  I spent 15 minutes providing this consultation,  from 11:00 to 11:15 including time spent with patient/family, chart review, provider coordination, documentation. More than 50% of the time in this consultation was spent coordinating communication.   HISTORY OF PRESENT ILLNESS:  Cheney L Sirmon is a 86 y.o. year old male with multiple medical problems including HF with preserved EF, BPH, carotid stenosis, CKD, [name], chronic back pain, anxiety, h/o [name].patient was treated at hospital for [place] 2/8-2/01/2020. Palliative Care was asked to help address goals of care.   CODE STATUS: DNR  PPS: 40% HOSPICE ELIGIBILITY/DIAGNOSIS: TBD  PHYSICAL EXAM:  HR  63  O2 96% on RA General: NAD,  frail appearing Cardiovascular: regular rate and rhythm Pulmonary: lung sounds clear; normal respiratory effort Abdomen: soft, nontender, + bowel sounds GU: no suprapubic tenderness Extremities: no edema, no joint deformities Skin: no rashes on exposed skin Neurological: Weakness; forgetfulness  PAST MEDICAL HISTORY:  Past Medical History:  Diagnosis Date  . (HFpEF) heart failure with preserved ejection fraction ([place])    a. 11/2017 Echo: EF 60-65%, no rwma, GR1 DD. Nl RV fxn.  . Anginal pain ([place])    c/o heaviness a few years ago.  stent placed and all resolved  . Anxiety   . BPH (benign prostatic hypertrophy)   . Carotid stenosis    a. 05/2015 Carotid U/S: mild nonobs atherosclerosis. No need for f/u.  Marland Kitchen Chronic back pain   . Chronic Left Pleural Effusion    a. 11/2017 Mod by CXR.  . CKD (chronic kidney disease), stage III   . Coronary artery disease    a. 06/2013 Cath/PCI: LAD 50-70p (FFR 0.82-->PCI w/ 3.5x24 Promus DES), RCA subtotally occluded w/ L->R collats, LCX 30ost.  . Dyslipidemia   . GERD (gastroesophageal reflux disease)   . Hx of [name]   . Hyperlipidemia   . Hypertension   . Inguinal hernia   . Lightheadedness    a. chronic, somewhat positional.  . 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 Major depression   . Sleep apnea    will not use cpap    SOCIAL HX:  Social History  Tobacco Use  . Smoking status: Former Smoker    Packs/day: 2.00    Years: 7.00    Pack years: 14.00    Types: Cigarettes    Quit date: 09/17/1953    Years since quitting: 66.0  . Smokeless tobacco: Never Used  Substance Use Topics  . Alcohol use: No    Alcohol/week: 0.0 standard drinks    ALLERGIES:  Allergies  Allergen Reactions  . Cyclobenzaprine Other (See Comments)    Hallucination. Mental instability.  DO NOT GIVE ANY MUSCLE RELAXANTS  . Other Other (See Comments)  . Sulfamethoxazole Other (See  Comments)  . Baclofen Other (See Comments)    Confusion and weakness  . Nsaids     Avoids due to liver.  . Sulfamethoxazole-Trimethoprim Nausea Only and Other (See Comments)    Patient unaware of this as an allergy  . Tolmetin Other (See Comments)    Avoids due to liver.it is an nsaid     PERTINENT MEDICATIONS:  Outpatient Encounter Medications as of 09/13/2019  Medication Sig  . acetaminophen (TYLENOL) 500 MG tablet Take 1,000 mg by mouth 3 (three) times daily.  Marland Kitchen acidophilus (RISAQUAD) CAPS capsule Take 1 capsule by mouth daily.  Marland Kitchen albuterol (VENTOLIN HFA) 108 (90 Base) MCG/ACT inhaler Inhale 2 puffs into the lungs every 6 (six) hours.  Marland Kitchen alum & mag hydroxide-simeth (MINTOX) 009-381-82 MG/5ML suspension Take 30 mLs by mouth every 6 (six) hours as needed for indigestion or heartburn.  Marland Kitchen antiseptic oral rinse (BIOTENE) LIQD 15 mLs by Mouth Rinse route 2 (two) times daily as needed for dry mouth.  Marland Kitchen apixaban (ELIQUIS) 2.5 MG TABS tablet Take 2.5 mg by mouth 2 (two) times daily.  Marland Kitchen ascorbic acid (VITAMIN C) 500 MG tablet Take 1 tablet (500 mg total) by mouth daily.  . cefUROXime (CEFTIN) 500 MG tablet Take 1 tablet (500 mg total) by mouth 2 (two) times daily with a meal.  . Cholecalciferol (VITAMIN D3) 5000 units CAPS Take 5,000 Units by mouth daily.   . clonazePAM (KLONOPIN) 0.5 MG tablet Take 0.5 mg by mouth in the morning, at noon, and at bedtime.   . diclofenac sodium (VOLTAREN) 1 % GEL Apply 2 g topically 2 (two) times daily as needed (lower back pain). (use when lidocaine patch is off)  . docusate sodium (COLACE) 100 MG capsule Take 100 mg by mouth 2 (two) times daily.  Marland Kitchen doxazosin (CARDURA) 1 MG tablet Take 1 tablet (1 mg total) by mouth daily.  Marland Kitchen escitalopram (LEXAPRO) 20 MG tablet Take 20 mg by mouth daily.   Marland Kitchen gabapentin (NEURONTIN) 300 MG capsule Take 300 mg by mouth at bedtime.   Marland Kitchen guaiFENesin (ROBITUSSIN) 100 MG/5ML liquid Take 200 mg by mouth every 6 (six) hours as needed for  cough.   . hydrocortisone 2.5 % cream Apply 1 application topically every other day. (apply to scalp and alternate with ketoconazole)  . isosorbide mononitrate (IMDUR) 30 MG 24 hr tablet Take 1 tablet (30 mg total) by mouth daily.  Marland Kitchen LACTOBACILLUS PO Take by mouth daily.  . Lidocaine (ASPERCREME LIDOCAINE) 4 % PTCH Apply 1 patch topically 2 (two) times daily.  Marland Kitchen lidocaine (XYLOCAINE) 2 % solution Use as directed 5 mLs in the mouth or throat 3 (three) times daily.  Marland Kitchen loperamide (IMODIUM) 2 MG capsule Take 2 mg by mouth as needed for diarrhea or loose stools. (max 8 doses in 24 hours)  . Lutein 10 MG TABS Take 10 mg by mouth 2 (two)  times daily.  . magic mouthwash SOLN Take 10 mLs by mouth 3 (three) times daily as needed for mouth pain. (Patient taking differently: Take 10 mLs by mouth 4 (four) times daily as needed for mouth pain. )  . melatonin 5 MG TABS Take 5 mg by mouth at bedtime as needed (sleep).   . metoprolol tartrate (LOPRESSOR) 25 MG tablet Take 12.5 mg by mouth 2 (two) times daily.   . Multiple Vitamins-Iron (MULTIVITAMINS WITH IRON) TABS tablet Take 1 tablet by mouth daily.  . nitroGLYCERIN (NITROSTAT) 0.4 MG SL tablet Place 0.4 mg under the tongue every 5 (five) minutes as needed for chest pain.  Marland Kitchen ondansetron (ZOFRAN) 4 MG tablet Take 4 mg by mouth every 6 (six) hours as needed for nausea or vomiting.  . pantoprazole (PROTONIX) 40 MG tablet Take 1 tablet (40 mg total) by mouth daily.  Marland Kitchen Phenylephrine-Cocoa Butter (PREPARATION H RE) Place 1 application rectally 4 (four) times daily as needed (rectal pain/itching).   . polyethylene glycol (MIRALAX / GLYCOLAX) packet Take 17 g by mouth daily as needed for mild constipation or moderate constipation.   . pravastatin (PRAVACHOL) 40 MG tablet Take 40 mg by mouth at bedtime.  . senna (SENOKOT) 8.6 MG TABS tablet Take 1 tablet by mouth at bedtime.   . torsemide (DEMADEX) 20 MG tablet Take 1 tablet (20 mg) by mouth once a day on Mondays/  Wednesdays/ & Fridays (Patient taking differently: Take 20 mg by mouth every Monday, Wednesday, and Friday. )  . traMADol (ULTRAM) 50 MG tablet Take 1 tablet (50 mg total) by mouth every 8 (eight) hours as needed. Patient to carry to his room  . triamcinolone cream (KENALOG) 0.1 % Apply 1 application topically 2 (two) times daily.  Marland Kitchen zinc sulfate 220 (50 Zn) MG capsule Take 220 mg by mouth daily.   No facility-administered encounter medications on file as of 09/13/2019.      Shandon Burlingame Jenetta Downer, NP

## 2019-09-27 ENCOUNTER — Encounter: Payer: Self-pay | Admitting: Dermatology

## 2019-09-27 ENCOUNTER — Other Ambulatory Visit: Payer: Self-pay

## 2019-09-27 ENCOUNTER — Ambulatory Visit (INDEPENDENT_AMBULATORY_CARE_PROVIDER_SITE_OTHER): Admitting: Dermatology

## 2019-09-27 DIAGNOSIS — L578 Other skin changes due to chronic exposure to nonionizing radiation: Secondary | ICD-10-CM

## 2019-09-27 DIAGNOSIS — I2510 Atherosclerotic heart disease of native coronary artery without angina pectoris: Secondary | ICD-10-CM | POA: Diagnosis not present

## 2019-09-27 DIAGNOSIS — Z85828 Personal history of other malignant neoplasm of skin: Secondary | ICD-10-CM

## 2019-09-27 DIAGNOSIS — L570 Actinic keratosis: Secondary | ICD-10-CM

## 2019-09-27 DIAGNOSIS — D692 Other nonthrombocytopenic purpura: Secondary | ICD-10-CM

## 2019-09-27 NOTE — Patient Instructions (Signed)
Cryotherapy Aftercare  . Wash gently with soap and water everyday.   . Apply Vaseline and Band-Aid daily until healed.  Prior to procedure, discussed risks of blister formation, small wound, skin dyspigmentation, or rare scar following cryotherapy.   

## 2019-09-27 NOTE — Progress Notes (Signed)
   Follow-Up Visit   Subjective  [name] is a 86 y.o. male who presents for the following: Skin Cancer (3 month recheck. Dx: SCC, Tx with [facility] 06/14/2019, post scalp/crown area.).  The following portions of the chart were reviewed this encounter and updated as appropriate: Tobacco  Allergies  Meds  Problems  Med Hx  Surg Hx  Fam Hx     Review of Systems: No other skin or systemic complaints except as noted in HPI or Assessment and Plan.  Objective  Well appearing patient in no apparent distress; mood and affect are within normal limits.  A focused examination was performed including head, including the scalp, face, neck, nose, ears, eyelids, and lips. Relevant physical exam findings are noted in the Assessment and Plan.  Objective  face and scalp x13 (13): Erythematous thin papules/macules with gritty scale.   Objective  bilateral forearms: Violaceous macules and patches.   Assessment & Plan    Actinic Damage - diffuse scaly erythematous macules with underlying dyspigmentation - Recommend daily broad spectrum sunscreen SPF 30+ to sun-exposed areas, reapply every 2 hours as needed.  - Call for new or changing lesions.  History of Squamous Cell Carcinoma of the Skin - No evidence of recurrence today at posterior/crown scalp. - No lymphadenopathy - Recommend regular full body skin exams - Recommend daily broad spectrum sunscreen SPF 30+ to sun-exposed areas, reapply every 2 hours as needed.  - Call if any new or changing lesions are noted between office visits  AK (actinic keratosis) (13) face and scalp x13  Destruction of lesion - face and scalp x13 Complexity: simple   Destruction method: cryotherapy   Informed consent: discussed and consent obtained   Timeout:  patient name, date of birth, surgical site, and procedure verified Lesion destroyed using liquid nitrogen: Yes   Region frozen until ice ball extended beyond lesion: Yes   Outcome: patient tolerated  procedure well with no complications   Post-procedure details: wound care instructions given    Senile purpura ([place]) bilateral forearms  Benign, observe.    Return in about 6 months (around 03/29/2020) for [facility] recheck.   I, Emelia Salisbury, CMA, am acting as scribe for Sarina Ser, MD.  Documentation: I have reviewed the above documentation for accuracy and completeness, and I agree with the above.  Sarina Ser, MD

## 2019-09-28 ENCOUNTER — Encounter: Payer: Self-pay | Admitting: Dermatology

## 2020-05-17 DEATH — deceased
